# Patient Record
Sex: Female | Born: 1935 | Race: White | Hispanic: No | Marital: Single | Smoking: Current every day smoker
Health system: Southern US, Community
[De-identification: ages and names within clinical notes are randomized; demographics above are authoritative.]

## PROBLEM LIST (undated history)

## (undated) DIAGNOSIS — E785 Hyperlipidemia, unspecified: Secondary | ICD-10-CM

## (undated) DIAGNOSIS — J441 Chronic obstructive pulmonary disease with (acute) exacerbation: Secondary | ICD-10-CM

## (undated) DIAGNOSIS — I701 Atherosclerosis of renal artery: Secondary | ICD-10-CM

## (undated) DIAGNOSIS — R55 Syncope and collapse: Secondary | ICD-10-CM

## (undated) DIAGNOSIS — T148XXA Other injury of unspecified body region, initial encounter: Secondary | ICD-10-CM

## (undated) DIAGNOSIS — M1990 Unspecified osteoarthritis, unspecified site: Secondary | ICD-10-CM

## (undated) DIAGNOSIS — S0990XA Unspecified injury of head, initial encounter: Secondary | ICD-10-CM

## (undated) DIAGNOSIS — I200 Unstable angina: Secondary | ICD-10-CM

## (undated) DIAGNOSIS — D696 Thrombocytopenia, unspecified: Secondary | ICD-10-CM

## (undated) DIAGNOSIS — M19049 Primary osteoarthritis, unspecified hand: Secondary | ICD-10-CM

## (undated) DIAGNOSIS — I10 Essential (primary) hypertension: Secondary | ICD-10-CM

## (undated) DIAGNOSIS — C4490 Unspecified malignant neoplasm of skin, unspecified: Secondary | ICD-10-CM

## (undated) DIAGNOSIS — Z87442 Personal history of urinary calculi: Secondary | ICD-10-CM

## (undated) DIAGNOSIS — M4800 Spinal stenosis, site unspecified: Secondary | ICD-10-CM

## (undated) DIAGNOSIS — D485 Neoplasm of uncertain behavior of skin: Secondary | ICD-10-CM

## (undated) DIAGNOSIS — B0229 Other postherpetic nervous system involvement: Secondary | ICD-10-CM

## (undated) DIAGNOSIS — D649 Anemia, unspecified: Secondary | ICD-10-CM

## (undated) DIAGNOSIS — L259 Unspecified contact dermatitis, unspecified cause: Secondary | ICD-10-CM

## (undated) DIAGNOSIS — I6529 Occlusion and stenosis of unspecified carotid artery: Secondary | ICD-10-CM

## (undated) DIAGNOSIS — E039 Hypothyroidism, unspecified: Secondary | ICD-10-CM

## (undated) DIAGNOSIS — H43819 Vitreous degeneration, unspecified eye: Secondary | ICD-10-CM

## (undated) DIAGNOSIS — Z853 Personal history of malignant neoplasm of breast: Secondary | ICD-10-CM

## (undated) DIAGNOSIS — L089 Local infection of the skin and subcutaneous tissue, unspecified: Secondary | ICD-10-CM

## (undated) DIAGNOSIS — E559 Vitamin D deficiency, unspecified: Secondary | ICD-10-CM

## (undated) DIAGNOSIS — T887XXA Unspecified adverse effect of drug or medicament, initial encounter: Secondary | ICD-10-CM

## (undated) DIAGNOSIS — I739 Peripheral vascular disease, unspecified: Secondary | ICD-10-CM

## (undated) DIAGNOSIS — I2510 Atherosclerotic heart disease of native coronary artery without angina pectoris: Secondary | ICD-10-CM

## (undated) DIAGNOSIS — H9190 Unspecified hearing loss, unspecified ear: Secondary | ICD-10-CM

## (undated) DIAGNOSIS — B029 Zoster without complications: Secondary | ICD-10-CM

## (undated) DIAGNOSIS — R945 Abnormal results of liver function studies: Secondary | ICD-10-CM

## (undated) DIAGNOSIS — J310 Chronic rhinitis: Secondary | ICD-10-CM

## (undated) HISTORY — DX: Syncope and collapse: R55

## (undated) HISTORY — DX: Anemia, unspecified: D64.9

## (undated) HISTORY — DX: Unspecified contact dermatitis, unspecified cause: L25.9

## (undated) HISTORY — DX: Unspecified hearing loss, unspecified ear: H91.90

## (undated) HISTORY — DX: Occlusion and stenosis of unspecified carotid artery: I65.29

## (undated) HISTORY — PX: TONSILLECTOMY: SHX5217

## (undated) HISTORY — PX: CORONARY ANGIOPLASTY WITH STENT PLACEMENT: SHX49

## (undated) HISTORY — DX: Other injury of unspecified body region, initial encounter: T14.8XXA

## (undated) HISTORY — PX: LUMBAR LAMINECTOMY: SHX95

## (undated) HISTORY — DX: Unspecified injury of head, initial encounter: S09.90XA

## (undated) HISTORY — DX: Chronic rhinitis: J31.0

## (undated) HISTORY — DX: Local infection of the skin and subcutaneous tissue, unspecified: L08.9

## (undated) HISTORY — DX: Unspecified malignant neoplasm of skin, unspecified: C44.90

## (undated) HISTORY — DX: Essential (primary) hypertension: I10

## (undated) HISTORY — DX: Unspecified osteoarthritis, unspecified site: M19.90

## (undated) HISTORY — DX: Personal history of urinary calculi: Z87.442

## (undated) HISTORY — DX: Vitamin D deficiency, unspecified: E55.9

## (undated) HISTORY — PX: SKIN CANCER EXCISION: SHX779

## (undated) HISTORY — DX: Zoster without complications: B02.9

## (undated) HISTORY — PX: BREAST LUMPECTOMY: SHX2

## (undated) HISTORY — DX: Vitreous degeneration, unspecified eye: H43.819

## (undated) HISTORY — DX: Unstable angina: I20.0

## (undated) HISTORY — DX: Thrombocytopenia, unspecified: D69.6

## (undated) HISTORY — DX: Unspecified adverse effect of drug or medicament, initial encounter: T88.7XXA

## (undated) HISTORY — DX: Chronic obstructive pulmonary disease with (acute) exacerbation: J44.1

## (undated) HISTORY — DX: Peripheral vascular disease, unspecified: I73.9

## (undated) HISTORY — DX: Atherosclerotic heart disease of native coronary artery without angina pectoris: I25.10

## (undated) HISTORY — DX: Atherosclerosis of renal artery: I70.1

## (undated) HISTORY — DX: Personal history of malignant neoplasm of breast: Z85.3

## (undated) HISTORY — DX: Hypothyroidism, unspecified: E03.9

## (undated) HISTORY — DX: Neoplasm of uncertain behavior of skin: D48.5

## (undated) HISTORY — DX: Other postherpetic nervous system involvement: B02.29

## (undated) HISTORY — PX: CATARACT EXTRACTION: SUR2

## (undated) HISTORY — PX: ABDOMINAL HYSTERECTOMY: SHX81

## (undated) HISTORY — DX: Abnormal results of liver function studies: R94.5

## (undated) HISTORY — DX: Primary osteoarthritis, unspecified hand: M19.049

## (undated) HISTORY — DX: Hyperlipidemia, unspecified: E78.5

## (undated) HISTORY — DX: Spinal stenosis, site unspecified: M48.00

---

## 1996-02-02 HISTORY — PX: BREAST LUMPECTOMY: SHX2

## 1997-08-11 ENCOUNTER — Emergency Department (HOSPITAL_COMMUNITY): Admission: EM | Admit: 1997-08-11 | Discharge: 1997-08-11 | Payer: Self-pay | Admitting: Emergency Medicine

## 1998-04-08 ENCOUNTER — Encounter: Payer: Self-pay | Admitting: Emergency Medicine

## 1998-04-08 ENCOUNTER — Emergency Department (HOSPITAL_COMMUNITY): Admission: EM | Admit: 1998-04-08 | Discharge: 1998-04-08 | Payer: Self-pay | Admitting: Emergency Medicine

## 1999-06-16 ENCOUNTER — Encounter: Payer: Self-pay | Admitting: Emergency Medicine

## 1999-06-16 ENCOUNTER — Inpatient Hospital Stay (HOSPITAL_COMMUNITY): Admission: EM | Admit: 1999-06-16 | Discharge: 1999-06-18 | Payer: Self-pay | Admitting: Emergency Medicine

## 1999-08-26 ENCOUNTER — Observation Stay (HOSPITAL_COMMUNITY): Admission: RE | Admit: 1999-08-26 | Discharge: 1999-08-27 | Payer: Self-pay | Admitting: *Deleted

## 1999-09-11 ENCOUNTER — Observation Stay (HOSPITAL_COMMUNITY): Admission: RE | Admit: 1999-09-11 | Discharge: 1999-09-12 | Payer: Self-pay | Admitting: *Deleted

## 1999-11-03 ENCOUNTER — Ambulatory Visit (HOSPITAL_BASED_OUTPATIENT_CLINIC_OR_DEPARTMENT_OTHER): Admission: RE | Admit: 1999-11-03 | Discharge: 1999-11-03 | Payer: Self-pay | Admitting: Plastic Surgery

## 1999-11-03 ENCOUNTER — Encounter (INDEPENDENT_AMBULATORY_CARE_PROVIDER_SITE_OTHER): Payer: Self-pay | Admitting: *Deleted

## 2000-01-22 ENCOUNTER — Encounter: Payer: Self-pay | Admitting: *Deleted

## 2000-01-22 ENCOUNTER — Ambulatory Visit (HOSPITAL_COMMUNITY): Admission: RE | Admit: 2000-01-22 | Discharge: 2000-01-22 | Payer: Self-pay | Admitting: *Deleted

## 2000-03-11 ENCOUNTER — Encounter: Payer: Self-pay | Admitting: Emergency Medicine

## 2000-03-12 ENCOUNTER — Inpatient Hospital Stay (HOSPITAL_COMMUNITY): Admission: EM | Admit: 2000-03-12 | Discharge: 2000-03-16 | Payer: Self-pay | Admitting: Emergency Medicine

## 2000-07-28 ENCOUNTER — Ambulatory Visit (HOSPITAL_COMMUNITY): Admission: RE | Admit: 2000-07-28 | Discharge: 2000-07-29 | Payer: Self-pay | Admitting: *Deleted

## 2000-07-28 ENCOUNTER — Encounter: Payer: Self-pay | Admitting: *Deleted

## 2000-12-21 ENCOUNTER — Ambulatory Visit (HOSPITAL_COMMUNITY): Admission: RE | Admit: 2000-12-21 | Discharge: 2000-12-22 | Payer: Self-pay | Admitting: *Deleted

## 2001-04-04 ENCOUNTER — Ambulatory Visit (HOSPITAL_COMMUNITY): Admission: RE | Admit: 2001-04-04 | Discharge: 2001-04-05 | Payer: Self-pay | Admitting: *Deleted

## 2001-05-25 ENCOUNTER — Encounter: Payer: Self-pay | Admitting: *Deleted

## 2001-05-25 ENCOUNTER — Ambulatory Visit (HOSPITAL_COMMUNITY): Admission: RE | Admit: 2001-05-25 | Discharge: 2001-05-25 | Payer: Self-pay | Admitting: *Deleted

## 2001-09-07 ENCOUNTER — Encounter: Payer: Self-pay | Admitting: Orthopaedic Surgery

## 2001-09-07 ENCOUNTER — Encounter: Admission: RE | Admit: 2001-09-07 | Discharge: 2001-09-07 | Payer: Self-pay | Admitting: Orthopaedic Surgery

## 2001-09-27 ENCOUNTER — Inpatient Hospital Stay (HOSPITAL_COMMUNITY): Admission: RE | Admit: 2001-09-27 | Discharge: 2001-10-04 | Payer: Self-pay | Admitting: Orthopaedic Surgery

## 2001-09-27 ENCOUNTER — Encounter: Payer: Self-pay | Admitting: *Deleted

## 2001-09-27 ENCOUNTER — Encounter: Payer: Self-pay | Admitting: Orthopaedic Surgery

## 2001-10-01 ENCOUNTER — Encounter: Payer: Self-pay | Admitting: Orthopaedic Surgery

## 2002-09-03 ENCOUNTER — Encounter (INDEPENDENT_AMBULATORY_CARE_PROVIDER_SITE_OTHER): Payer: Self-pay | Admitting: Specialist

## 2002-09-03 ENCOUNTER — Ambulatory Visit (HOSPITAL_COMMUNITY): Admission: RE | Admit: 2002-09-03 | Discharge: 2002-09-03 | Payer: Self-pay | Admitting: Gastroenterology

## 2002-10-12 ENCOUNTER — Other Ambulatory Visit: Admission: RE | Admit: 2002-10-12 | Discharge: 2002-10-12 | Payer: Self-pay | Admitting: *Deleted

## 2003-04-08 ENCOUNTER — Observation Stay (HOSPITAL_COMMUNITY): Admission: AD | Admit: 2003-04-08 | Discharge: 2003-04-09 | Payer: Self-pay | Admitting: Cardiology

## 2003-09-06 ENCOUNTER — Encounter: Admission: RE | Admit: 2003-09-06 | Discharge: 2003-09-06 | Payer: Self-pay | Admitting: Internal Medicine

## 2003-09-18 ENCOUNTER — Encounter: Admission: RE | Admit: 2003-09-18 | Discharge: 2003-09-18 | Payer: Self-pay | Admitting: Internal Medicine

## 2003-12-09 ENCOUNTER — Ambulatory Visit: Payer: Self-pay | Admitting: Internal Medicine

## 2003-12-16 ENCOUNTER — Ambulatory Visit: Payer: Self-pay | Admitting: Internal Medicine

## 2003-12-23 ENCOUNTER — Ambulatory Visit: Payer: Self-pay | Admitting: Internal Medicine

## 2003-12-30 ENCOUNTER — Ambulatory Visit: Payer: Self-pay | Admitting: Cardiology

## 2003-12-30 ENCOUNTER — Observation Stay (HOSPITAL_COMMUNITY): Admission: EM | Admit: 2003-12-30 | Discharge: 2004-01-01 | Payer: Self-pay | Admitting: Emergency Medicine

## 2003-12-31 ENCOUNTER — Ambulatory Visit: Payer: Self-pay | Admitting: Cardiology

## 2004-01-07 ENCOUNTER — Ambulatory Visit: Payer: Self-pay | Admitting: Internal Medicine

## 2004-01-13 ENCOUNTER — Ambulatory Visit: Payer: Self-pay | Admitting: Cardiology

## 2004-01-22 ENCOUNTER — Ambulatory Visit: Payer: Self-pay | Admitting: Internal Medicine

## 2004-02-10 ENCOUNTER — Ambulatory Visit: Payer: Self-pay | Admitting: Cardiology

## 2004-03-10 ENCOUNTER — Ambulatory Visit: Payer: Self-pay

## 2004-04-14 ENCOUNTER — Ambulatory Visit: Payer: Self-pay | Admitting: Internal Medicine

## 2004-06-05 ENCOUNTER — Ambulatory Visit: Payer: Self-pay | Admitting: Internal Medicine

## 2004-07-15 ENCOUNTER — Ambulatory Visit: Payer: Self-pay | Admitting: Internal Medicine

## 2004-07-24 ENCOUNTER — Ambulatory Visit: Payer: Self-pay | Admitting: Cardiology

## 2004-07-31 ENCOUNTER — Ambulatory Visit: Payer: Self-pay | Admitting: Cardiology

## 2004-09-07 ENCOUNTER — Ambulatory Visit: Payer: Self-pay | Admitting: Internal Medicine

## 2004-09-15 ENCOUNTER — Ambulatory Visit: Payer: Self-pay | Admitting: Cardiology

## 2004-09-18 ENCOUNTER — Ambulatory Visit: Payer: Self-pay | Admitting: Internal Medicine

## 2004-11-09 ENCOUNTER — Ambulatory Visit: Payer: Self-pay | Admitting: Internal Medicine

## 2004-11-20 ENCOUNTER — Ambulatory Visit: Payer: Self-pay | Admitting: Internal Medicine

## 2004-12-08 ENCOUNTER — Ambulatory Visit: Payer: Self-pay | Admitting: Internal Medicine

## 2004-12-14 ENCOUNTER — Ambulatory Visit: Payer: Self-pay | Admitting: Internal Medicine

## 2004-12-26 ENCOUNTER — Ambulatory Visit: Payer: Self-pay | Admitting: Family Medicine

## 2005-01-13 ENCOUNTER — Ambulatory Visit: Payer: Self-pay | Admitting: Internal Medicine

## 2005-01-29 ENCOUNTER — Ambulatory Visit: Payer: Self-pay | Admitting: Internal Medicine

## 2005-02-04 ENCOUNTER — Ambulatory Visit: Payer: Self-pay | Admitting: Internal Medicine

## 2005-03-17 ENCOUNTER — Ambulatory Visit: Payer: Self-pay

## 2005-04-13 ENCOUNTER — Ambulatory Visit: Payer: Self-pay | Admitting: Internal Medicine

## 2005-04-21 ENCOUNTER — Ambulatory Visit: Payer: Self-pay | Admitting: Internal Medicine

## 2005-04-30 ENCOUNTER — Ambulatory Visit: Payer: Self-pay | Admitting: Internal Medicine

## 2005-07-14 ENCOUNTER — Ambulatory Visit: Payer: Self-pay | Admitting: Internal Medicine

## 2005-08-12 ENCOUNTER — Ambulatory Visit: Payer: Self-pay | Admitting: Cardiology

## 2005-08-13 ENCOUNTER — Ambulatory Visit: Payer: Self-pay | Admitting: Internal Medicine

## 2005-08-18 ENCOUNTER — Ambulatory Visit: Payer: Self-pay | Admitting: Oncology

## 2005-08-19 ENCOUNTER — Encounter: Payer: Self-pay | Admitting: Internal Medicine

## 2005-08-19 ENCOUNTER — Ambulatory Visit: Payer: Self-pay

## 2005-08-30 LAB — CBC WITH DIFFERENTIAL/PLATELET
BASO%: 0.5 % (ref 0.0–2.0)
Basophils Absolute: 0 10e3/uL (ref 0.0–0.1)
EOS%: 0.7 % (ref 0.0–7.0)
Eosinophils Absolute: 0.1 10e3/uL (ref 0.0–0.5)
HCT: 38.5 % (ref 34.8–46.6)
HGB: 13.1 g/dL (ref 11.6–15.9)
LYMPH%: 35.1 % (ref 14.0–48.0)
MCH: 32.3 pg (ref 26.0–34.0)
MCHC: 34.1 g/dL (ref 32.0–36.0)
MCV: 94.8 fL (ref 81.0–101.0)
MONO#: 0.6 10e3/uL (ref 0.1–0.9)
MONO%: 7.2 % (ref 0.0–13.0)
NEUT#: 4.9 10e3/uL (ref 1.5–6.5)
NEUT%: 56.5 % (ref 39.6–76.8)
Platelets: 203 10e3/uL (ref 145–400)
RBC: 4.06 10e6/uL (ref 3.70–5.32)
RDW: 14.4 % (ref 11.3–14.5)
WBC: 8.6 10e3/uL (ref 3.9–10.0)
lymph#: 3 10e3/uL (ref 0.9–3.3)

## 2005-08-30 LAB — APTT: aPTT: 29 seconds (ref 24–37)

## 2005-08-30 LAB — PROTHROMBIN TIME: INR: 0.9 (ref 0.0–1.5)

## 2005-08-31 LAB — COMPREHENSIVE METABOLIC PANEL
AST: 22 U/L (ref 0–37)
BUN: 24 mg/dL — ABNORMAL HIGH (ref 6–23)
Calcium: 9.5 mg/dL (ref 8.4–10.5)
Chloride: 108 mEq/L (ref 96–112)
Creatinine, Ser: 1.02 mg/dL (ref 0.40–1.20)
Glucose, Bld: 91 mg/dL (ref 70–99)

## 2005-09-01 ENCOUNTER — Ambulatory Visit (HOSPITAL_COMMUNITY): Admission: RE | Admit: 2005-09-01 | Discharge: 2005-09-01 | Payer: Self-pay | Admitting: Oncology

## 2005-09-20 ENCOUNTER — Ambulatory Visit: Payer: Self-pay

## 2005-10-21 ENCOUNTER — Ambulatory Visit: Payer: Self-pay | Admitting: Internal Medicine

## 2005-10-29 ENCOUNTER — Ambulatory Visit: Payer: Self-pay | Admitting: Internal Medicine

## 2005-11-09 ENCOUNTER — Emergency Department (HOSPITAL_COMMUNITY): Admission: EM | Admit: 2005-11-09 | Discharge: 2005-11-09 | Payer: Self-pay | Admitting: Family Medicine

## 2005-12-01 ENCOUNTER — Ambulatory Visit: Payer: Self-pay | Admitting: Internal Medicine

## 2005-12-14 ENCOUNTER — Ambulatory Visit: Payer: Self-pay | Admitting: Internal Medicine

## 2005-12-14 LAB — CONVERTED CEMR LAB
CO2: 27 meq/L (ref 19–32)
Calcium: 9.3 mg/dL (ref 8.4–10.5)
Creatinine, Ser: 1 mg/dL (ref 0.4–1.2)
Glomerular Filtration Rate, Af Am: 69 mL/min/{1.73_m2}
Glucose, Bld: 96 mg/dL (ref 70–99)
LDL Cholesterol: 79 mg/dL (ref 0–99)
Potassium: 4.3 meq/L (ref 3.5–5.1)
Sodium: 142 meq/L (ref 135–145)
Triglyceride fasting, serum: 83 mg/dL (ref 0–149)
VLDL: 17 mg/dL (ref 0–40)

## 2005-12-26 ENCOUNTER — Emergency Department (HOSPITAL_COMMUNITY): Admission: EM | Admit: 2005-12-26 | Discharge: 2005-12-26 | Payer: Self-pay | Admitting: Family Medicine

## 2006-01-03 ENCOUNTER — Ambulatory Visit: Payer: Self-pay | Admitting: Internal Medicine

## 2006-02-13 DIAGNOSIS — I739 Peripheral vascular disease, unspecified: Secondary | ICD-10-CM | POA: Insufficient documentation

## 2006-02-13 DIAGNOSIS — I2510 Atherosclerotic heart disease of native coronary artery without angina pectoris: Secondary | ICD-10-CM | POA: Insufficient documentation

## 2006-02-13 DIAGNOSIS — I10 Essential (primary) hypertension: Secondary | ICD-10-CM | POA: Insufficient documentation

## 2006-02-13 DIAGNOSIS — C4490 Unspecified malignant neoplasm of skin, unspecified: Secondary | ICD-10-CM | POA: Insufficient documentation

## 2006-02-13 DIAGNOSIS — Z87448 Personal history of other diseases of urinary system: Secondary | ICD-10-CM | POA: Insufficient documentation

## 2006-02-13 HISTORY — DX: Atherosclerotic heart disease of native coronary artery without angina pectoris: I25.10

## 2006-02-13 HISTORY — DX: Essential (primary) hypertension: I10

## 2006-02-13 HISTORY — DX: Unspecified malignant neoplasm of skin, unspecified: C44.90

## 2006-02-13 HISTORY — DX: Peripheral vascular disease, unspecified: I73.9

## 2006-04-05 ENCOUNTER — Ambulatory Visit: Payer: Self-pay | Admitting: Internal Medicine

## 2006-07-29 ENCOUNTER — Ambulatory Visit: Payer: Self-pay | Admitting: Cardiology

## 2006-07-29 LAB — CONVERTED CEMR LAB
ALT: 21 units/L (ref 0–35)
AST: 23 units/L (ref 0–37)
Albumin: 3.6 g/dL (ref 3.5–5.2)
Alkaline Phosphatase: 76 units/L (ref 39–117)
BUN: 16 mg/dL (ref 6–23)
GFR calc non Af Amer: 51 mL/min
Total Protein: 6.2 g/dL (ref 6.0–8.3)
VLDL: 21 mg/dL (ref 0–40)

## 2006-08-25 ENCOUNTER — Ambulatory Visit: Payer: Self-pay | Admitting: Family Medicine

## 2006-08-25 DIAGNOSIS — H9190 Unspecified hearing loss, unspecified ear: Secondary | ICD-10-CM | POA: Insufficient documentation

## 2006-08-25 DIAGNOSIS — B0229 Other postherpetic nervous system involvement: Secondary | ICD-10-CM | POA: Insufficient documentation

## 2006-08-25 DIAGNOSIS — M26629 Arthralgia of temporomandibular joint, unspecified side: Secondary | ICD-10-CM | POA: Insufficient documentation

## 2006-08-25 HISTORY — DX: Other postherpetic nervous system involvement: B02.29

## 2006-08-25 HISTORY — DX: Unspecified hearing loss, unspecified ear: H91.90

## 2006-09-20 ENCOUNTER — Ambulatory Visit: Payer: Self-pay | Admitting: Cardiology

## 2006-09-20 ENCOUNTER — Encounter: Payer: Self-pay | Admitting: Internal Medicine

## 2006-09-20 ENCOUNTER — Ambulatory Visit: Payer: Self-pay

## 2006-09-20 LAB — CONVERTED CEMR LAB
Bilirubin, Direct: 0.1 mg/dL (ref 0.0–0.3)
HDL: 44.9 mg/dL (ref >39.0)
LDL Cholesterol: 59 mg/dL (ref 0–99)
Total Bilirubin: 1 mg/dL (ref 0.3–1.2)
Total Protein: 5.6 g/dL — ABNORMAL LOW (ref 6.0–8.3)
Triglycerides: 64 mg/dL (ref 0–149)

## 2006-09-27 ENCOUNTER — Ambulatory Visit: Payer: Self-pay | Admitting: Internal Medicine

## 2006-09-27 DIAGNOSIS — M1990 Unspecified osteoarthritis, unspecified site: Secondary | ICD-10-CM | POA: Insufficient documentation

## 2006-09-27 DIAGNOSIS — M79609 Pain in unspecified limb: Secondary | ICD-10-CM | POA: Insufficient documentation

## 2006-09-27 DIAGNOSIS — F17200 Nicotine dependence, unspecified, uncomplicated: Secondary | ICD-10-CM | POA: Insufficient documentation

## 2006-09-27 HISTORY — DX: Unspecified osteoarthritis, unspecified site: M19.90

## 2006-09-28 ENCOUNTER — Encounter: Payer: Self-pay | Admitting: Internal Medicine

## 2006-10-03 ENCOUNTER — Encounter: Payer: Self-pay | Admitting: Internal Medicine

## 2006-10-03 DIAGNOSIS — I779 Disorder of arteries and arterioles, unspecified: Secondary | ICD-10-CM | POA: Insufficient documentation

## 2006-10-03 DIAGNOSIS — I6529 Occlusion and stenosis of unspecified carotid artery: Secondary | ICD-10-CM

## 2006-10-03 DIAGNOSIS — I739 Peripheral vascular disease, unspecified: Secondary | ICD-10-CM

## 2006-10-03 HISTORY — DX: Occlusion and stenosis of unspecified carotid artery: I65.29

## 2006-11-01 ENCOUNTER — Ambulatory Visit: Payer: Self-pay | Admitting: Internal Medicine

## 2006-11-01 DIAGNOSIS — M4800 Spinal stenosis, site unspecified: Secondary | ICD-10-CM

## 2006-11-01 HISTORY — DX: Spinal stenosis, site unspecified: M48.00

## 2006-11-05 ENCOUNTER — Encounter: Payer: Self-pay | Admitting: Internal Medicine

## 2006-11-05 ENCOUNTER — Ambulatory Visit: Payer: Self-pay | Admitting: Family Medicine

## 2006-11-08 ENCOUNTER — Ambulatory Visit: Payer: Self-pay | Admitting: Family Medicine

## 2006-11-18 ENCOUNTER — Telehealth: Payer: Self-pay | Admitting: Family Medicine

## 2006-11-21 ENCOUNTER — Ambulatory Visit: Payer: Self-pay | Admitting: Internal Medicine

## 2006-11-21 DIAGNOSIS — J449 Chronic obstructive pulmonary disease, unspecified: Secondary | ICD-10-CM | POA: Insufficient documentation

## 2006-12-13 ENCOUNTER — Ambulatory Visit: Payer: Self-pay | Admitting: Internal Medicine

## 2006-12-13 DIAGNOSIS — J310 Chronic rhinitis: Secondary | ICD-10-CM | POA: Insufficient documentation

## 2006-12-13 HISTORY — DX: Chronic rhinitis: J31.0

## 2006-12-13 LAB — CONVERTED CEMR LAB
Basophils Absolute: 0 10*3/uL (ref 0.0–0.1)
Eosinophils Absolute: 0 10*3/uL (ref 0.0–0.6)
HCT: 38.6 % (ref 36.0–46.0)
Lymphocytes Relative: 13 % (ref 12.0–46.0)
MCV: 93.9 fL (ref 78.0–100.0)
Neutro Abs: 10.8 10*3/uL — ABNORMAL HIGH (ref 1.4–7.7)
TSH: 0.58 microintl units/mL (ref 0.35–5.50)
WBC: 13.1 10*3/uL — ABNORMAL HIGH (ref 4.5–10.5)

## 2006-12-27 LAB — CONVERTED CEMR LAB: Vit D, 1,25-Dihydroxy: 18 — ABNORMAL LOW (ref 30–89)

## 2007-01-30 ENCOUNTER — Ambulatory Visit: Payer: Self-pay | Admitting: Internal Medicine

## 2007-01-30 DIAGNOSIS — T887XXA Unspecified adverse effect of drug or medicament, initial encounter: Secondary | ICD-10-CM

## 2007-01-30 DIAGNOSIS — E039 Hypothyroidism, unspecified: Secondary | ICD-10-CM

## 2007-01-30 HISTORY — DX: Hypothyroidism, unspecified: E03.9

## 2007-01-30 HISTORY — DX: Unspecified adverse effect of drug or medicament, initial encounter: T88.7XXA

## 2007-01-30 LAB — CONVERTED CEMR LAB
Glucose, Urine, Semiquant: NEGATIVE
Nitrite: NEGATIVE
Specific Gravity, Urine: 1.025
Urobilinogen, UA: 0.2
Vit D, 1,25-Dihydroxy: 53 (ref 30–89)

## 2007-02-03 LAB — CONVERTED CEMR LAB
Alkaline Phosphatase: 77 units/L (ref 39–117)
Bilirubin, Direct: 0.2 mg/dL (ref 0.0–0.3)
CO2: 30 meq/L (ref 19–32)
Eosinophils Absolute: 0.2 10*3/uL (ref 0.0–0.6)
GFR calc Af Amer: 69 mL/min
GFR calc non Af Amer: 57 mL/min
Glucose, Bld: 94 mg/dL (ref 70–99)
Lymphocytes Relative: 30.9 % (ref 12.0–46.0)
MCHC: 35.1 g/dL (ref 30.0–36.0)
Monocytes Relative: 7.1 % (ref 3.0–11.0)
Neutro Abs: 3.7 10*3/uL (ref 1.4–7.7)
Potassium: 4.1 meq/L (ref 3.5–5.1)
RBC: 4.24 M/uL (ref 3.87–5.11)
TSH: 1.76 microintl units/mL (ref 0.35–5.50)
Total Protein: 5.8 g/dL — ABNORMAL LOW (ref 6.0–8.3)
WBC: 6.4 10*3/uL (ref 4.5–10.5)

## 2007-02-07 ENCOUNTER — Ambulatory Visit: Payer: Self-pay | Admitting: Internal Medicine

## 2007-02-07 DIAGNOSIS — E559 Vitamin D deficiency, unspecified: Secondary | ICD-10-CM | POA: Insufficient documentation

## 2007-02-07 HISTORY — DX: Vitamin D deficiency, unspecified: E55.9

## 2007-02-07 LAB — CONVERTED CEMR LAB
Bilirubin Urine: NEGATIVE
Glucose, Urine, Semiquant: 100
Protein, U semiquant: NEGATIVE
Specific Gravity, Urine: 1.015
Urobilinogen, UA: 0.2

## 2007-03-13 ENCOUNTER — Encounter: Payer: Self-pay | Admitting: Internal Medicine

## 2007-03-15 ENCOUNTER — Encounter: Payer: Self-pay | Admitting: Internal Medicine

## 2007-04-11 ENCOUNTER — Encounter: Payer: Self-pay | Admitting: Internal Medicine

## 2007-05-01 ENCOUNTER — Encounter: Payer: Self-pay | Admitting: Internal Medicine

## 2007-06-01 ENCOUNTER — Ambulatory Visit: Payer: Self-pay | Admitting: Internal Medicine

## 2007-07-13 ENCOUNTER — Ambulatory Visit: Payer: Self-pay | Admitting: Cardiology

## 2007-07-13 LAB — CONVERTED CEMR LAB
ALT: 24 units/L (ref 0–35)
AST: 30 units/L (ref 0–37)
Alkaline Phosphatase: 79 units/L (ref 39–117)
BUN: 36 mg/dL — ABNORMAL HIGH (ref 6–23)
Chloride: 107 meq/L (ref 96–112)
Creatinine, Ser: 1.2 mg/dL (ref 0.4–1.2)
GFR calc Af Amer: 56 mL/min
Glucose, Bld: 102 mg/dL — ABNORMAL HIGH (ref 70–99)
Sodium: 141 meq/L (ref 135–145)
Total CHOL/HDL Ratio: 2.5

## 2007-07-26 ENCOUNTER — Ambulatory Visit: Payer: Self-pay | Admitting: Cardiology

## 2007-07-26 LAB — CONVERTED CEMR LAB
BUN: 28 mg/dL — ABNORMAL HIGH (ref 6–23)
Calcium: 9.1 mg/dL (ref 8.4–10.5)
Creatinine, Ser: 1.2 mg/dL (ref 0.4–1.2)
Total CK: 58 units/L (ref 7–177)

## 2007-08-22 ENCOUNTER — Ambulatory Visit: Payer: Self-pay

## 2007-09-08 ENCOUNTER — Ambulatory Visit: Payer: Self-pay | Admitting: Internal Medicine

## 2007-09-08 DIAGNOSIS — R5383 Other fatigue: Secondary | ICD-10-CM

## 2007-09-08 DIAGNOSIS — R5381 Other malaise: Secondary | ICD-10-CM | POA: Insufficient documentation

## 2007-09-08 LAB — CONVERTED CEMR LAB
Anti Nuclear Antibody(ANA): NEGATIVE
Vit D, 1,25-Dihydroxy: 42 (ref 30–89)

## 2007-09-11 LAB — CONVERTED CEMR LAB
BUN: 27 mg/dL — ABNORMAL HIGH (ref 6–23)
Chloride: 113 meq/L — ABNORMAL HIGH (ref 96–112)
Creatinine, Ser: 1 mg/dL (ref 0.4–1.2)
Eosinophils Relative: 1.4 % (ref 0.0–5.0)
HCT: 39.8 % (ref 36.0–46.0)
Hemoglobin: 13.5 g/dL (ref 12.0–15.0)
Lymphocytes Relative: 28.6 % (ref 12.0–46.0)
MCHC: 33.9 g/dL (ref 30.0–36.0)
Platelets: 170 10*3/uL (ref 150–400)
RDW: 14.4 % (ref 11.5–14.6)
Sodium: 144 meq/L (ref 135–145)

## 2007-09-15 ENCOUNTER — Ambulatory Visit: Payer: Self-pay

## 2007-09-25 ENCOUNTER — Ambulatory Visit: Payer: Self-pay | Admitting: Internal Medicine

## 2007-09-25 DIAGNOSIS — L259 Unspecified contact dermatitis, unspecified cause: Secondary | ICD-10-CM | POA: Insufficient documentation

## 2007-09-25 HISTORY — DX: Unspecified contact dermatitis, unspecified cause: L25.9

## 2007-09-26 ENCOUNTER — Encounter: Payer: Self-pay | Admitting: Internal Medicine

## 2007-09-28 ENCOUNTER — Telehealth: Payer: Self-pay | Admitting: *Deleted

## 2007-11-07 ENCOUNTER — Ambulatory Visit: Payer: Self-pay | Admitting: Internal Medicine

## 2007-11-15 ENCOUNTER — Encounter: Payer: Self-pay | Admitting: Internal Medicine

## 2007-12-16 ENCOUNTER — Emergency Department (HOSPITAL_COMMUNITY): Admission: EM | Admit: 2007-12-16 | Discharge: 2007-12-16 | Payer: Self-pay | Admitting: Family Medicine

## 2008-01-12 ENCOUNTER — Ambulatory Visit: Payer: Self-pay

## 2008-01-17 ENCOUNTER — Ambulatory Visit: Payer: Self-pay | Admitting: Internal Medicine

## 2008-01-17 DIAGNOSIS — G44309 Post-traumatic headache, unspecified, not intractable: Secondary | ICD-10-CM | POA: Insufficient documentation

## 2008-01-17 DIAGNOSIS — R636 Underweight: Secondary | ICD-10-CM | POA: Insufficient documentation

## 2008-01-19 ENCOUNTER — Ambulatory Visit: Payer: Self-pay

## 2008-01-31 ENCOUNTER — Ambulatory Visit: Payer: Self-pay | Admitting: Internal Medicine

## 2008-02-01 ENCOUNTER — Encounter: Payer: Self-pay | Admitting: Internal Medicine

## 2008-02-06 ENCOUNTER — Ambulatory Visit: Payer: Self-pay | Admitting: Cardiology

## 2008-02-21 ENCOUNTER — Ambulatory Visit: Payer: Self-pay | Admitting: Cardiology

## 2008-02-21 LAB — CONVERTED CEMR LAB
ALT: 19 units/L (ref 0–35)
Albumin: 3.5 g/dL (ref 3.5–5.2)
Alkaline Phosphatase: 70 units/L (ref 39–117)
Bilirubin, Direct: 0.1 mg/dL (ref 0.0–0.3)
Calcium: 9.3 mg/dL (ref 8.4–10.5)
GFR calc Af Amer: 78 mL/min
Glucose, Bld: 106 mg/dL — ABNORMAL HIGH (ref 70–99)
HDL: 38.9 mg/dL — ABNORMAL LOW (ref >39.0)
LDL Cholesterol: 62 mg/dL (ref 0–99)
Sodium: 144 meq/L (ref 135–145)
Total Bilirubin: 0.7 mg/dL (ref 0.3–1.2)
Total Protein: 6.4 g/dL (ref 6.0–8.3)
VLDL: 11 mg/dL (ref 0–40)

## 2008-02-23 ENCOUNTER — Ambulatory Visit: Payer: Self-pay | Admitting: Cardiovascular Disease

## 2008-02-26 ENCOUNTER — Ambulatory Visit: Payer: Self-pay | Admitting: Internal Medicine

## 2008-02-29 ENCOUNTER — Ambulatory Visit: Payer: Self-pay | Admitting: Cardiovascular Disease

## 2008-02-29 LAB — CONVERTED CEMR LAB
BUN: 19 mg/dL (ref 6–23)
Basophils Absolute: 0 10*3/uL (ref 0.0–0.1)
Basophils Relative: 0.3 % (ref 0.0–3.0)
Calcium: 9.1 mg/dL (ref 8.4–10.5)
Creatinine, Ser: 0.8 mg/dL (ref 0.4–1.2)
Eosinophils Absolute: 0.1 10*3/uL (ref 0.0–0.7)
Eosinophils Relative: 1.8 % (ref 0.0–5.0)
GFR calc Af Amer: 89 mL/min
Glucose, Bld: 104 mg/dL — ABNORMAL HIGH (ref 70–99)
INR: 0.9 (ref 0.8–1.0)
Monocytes Absolute: 0.5 10*3/uL (ref 0.1–1.0)
Platelets: 161 10*3/uL (ref 150–400)
RBC: 4.28 M/uL (ref 3.87–5.11)

## 2008-03-06 ENCOUNTER — Ambulatory Visit (HOSPITAL_COMMUNITY): Admission: RE | Admit: 2008-03-06 | Discharge: 2008-03-06 | Payer: Self-pay | Admitting: Cardiovascular Disease

## 2008-03-06 ENCOUNTER — Ambulatory Visit: Payer: Self-pay | Admitting: Cardiovascular Disease

## 2008-03-10 ENCOUNTER — Encounter: Payer: Self-pay | Admitting: Internal Medicine

## 2008-03-10 ENCOUNTER — Inpatient Hospital Stay (HOSPITAL_COMMUNITY): Admission: EM | Admit: 2008-03-10 | Discharge: 2008-03-13 | Payer: Self-pay | Admitting: Emergency Medicine

## 2008-03-10 ENCOUNTER — Ambulatory Visit: Payer: Self-pay | Admitting: Internal Medicine

## 2008-03-10 DIAGNOSIS — E785 Hyperlipidemia, unspecified: Secondary | ICD-10-CM | POA: Insufficient documentation

## 2008-03-10 HISTORY — DX: Hyperlipidemia, unspecified: E78.5

## 2008-03-11 ENCOUNTER — Encounter: Payer: Self-pay | Admitting: Internal Medicine

## 2008-03-18 ENCOUNTER — Ambulatory Visit: Payer: Self-pay | Admitting: Internal Medicine

## 2008-03-18 DIAGNOSIS — D649 Anemia, unspecified: Secondary | ICD-10-CM | POA: Insufficient documentation

## 2008-03-18 HISTORY — DX: Anemia, unspecified: D64.9

## 2008-03-21 ENCOUNTER — Ambulatory Visit: Payer: Self-pay | Admitting: Internal Medicine

## 2008-03-26 ENCOUNTER — Ambulatory Visit: Payer: Self-pay | Admitting: Cardiovascular Disease

## 2008-04-05 ENCOUNTER — Ambulatory Visit: Payer: Self-pay | Admitting: Internal Medicine

## 2008-04-05 DIAGNOSIS — G4700 Insomnia, unspecified: Secondary | ICD-10-CM | POA: Insufficient documentation

## 2008-04-30 ENCOUNTER — Encounter: Payer: Self-pay | Admitting: Internal Medicine

## 2008-05-02 ENCOUNTER — Encounter: Payer: Self-pay | Admitting: Internal Medicine

## 2008-05-06 ENCOUNTER — Encounter: Payer: Self-pay | Admitting: Internal Medicine

## 2008-05-10 ENCOUNTER — Encounter: Payer: Self-pay | Admitting: Internal Medicine

## 2008-05-31 ENCOUNTER — Emergency Department (HOSPITAL_COMMUNITY): Admission: EM | Admit: 2008-05-31 | Discharge: 2008-05-31 | Payer: Self-pay | Admitting: Emergency Medicine

## 2008-06-11 ENCOUNTER — Ambulatory Visit: Payer: Self-pay | Admitting: Internal Medicine

## 2008-06-11 DIAGNOSIS — R109 Unspecified abdominal pain: Secondary | ICD-10-CM | POA: Insufficient documentation

## 2008-06-11 DIAGNOSIS — R634 Abnormal weight loss: Secondary | ICD-10-CM | POA: Insufficient documentation

## 2008-06-11 DIAGNOSIS — Z9189 Other specified personal risk factors, not elsewhere classified: Secondary | ICD-10-CM | POA: Insufficient documentation

## 2008-06-12 ENCOUNTER — Ambulatory Visit: Payer: Self-pay | Admitting: Internal Medicine

## 2008-06-17 ENCOUNTER — Encounter (INDEPENDENT_AMBULATORY_CARE_PROVIDER_SITE_OTHER): Payer: Self-pay | Admitting: *Deleted

## 2008-07-24 ENCOUNTER — Ambulatory Visit: Payer: Self-pay | Admitting: Internal Medicine

## 2008-07-24 DIAGNOSIS — Z853 Personal history of malignant neoplasm of breast: Secondary | ICD-10-CM

## 2008-07-24 HISTORY — DX: Personal history of malignant neoplasm of breast: Z85.3

## 2008-07-24 LAB — CONVERTED CEMR LAB
ALT: 19 units/L (ref 0–35)
Alkaline Phosphatase: 58 units/L (ref 39–117)
Basophils Relative: 0.7 % (ref 0.0–3.0)
CO2: 29 meq/L (ref 19–32)
Calcium: 9.3 mg/dL (ref 8.4–10.5)
Chloride: 106 meq/L (ref 96–112)
Creatinine, Ser: 1.1 mg/dL (ref 0.4–1.2)
Eosinophils Absolute: 0.2 10*3/uL (ref 0.0–0.7)
Eosinophils Relative: 2.6 % (ref 0.0–5.0)
Lymphocytes Relative: 22 % (ref 12.0–46.0)
Lymphs Abs: 1.4 10*3/uL (ref 0.7–4.0)
MCHC: 34.8 g/dL (ref 30.0–36.0)
MCV: 90.6 fL (ref 78.0–100.0)
Monocytes Relative: 8.9 % (ref 3.0–12.0)
Neutro Abs: 4.3 10*3/uL (ref 1.4–7.7)
Neutrophils Relative %: 65.8 % (ref 43.0–77.0)
Platelets: 129 10*3/uL — ABNORMAL LOW (ref 150.0–400.0)
Potassium: 4.7 meq/L (ref 3.5–5.1)
RBC: 4.48 M/uL (ref 3.87–5.11)
Total Protein: 6.5 g/dL (ref 6.0–8.3)

## 2008-07-26 ENCOUNTER — Ambulatory Visit: Payer: Self-pay | Admitting: Internal Medicine

## 2008-07-29 ENCOUNTER — Encounter: Admission: RE | Admit: 2008-07-29 | Discharge: 2008-07-29 | Payer: Self-pay | Admitting: Internal Medicine

## 2008-07-30 DIAGNOSIS — D696 Thrombocytopenia, unspecified: Secondary | ICD-10-CM

## 2008-07-30 HISTORY — DX: Thrombocytopenia, unspecified: D69.6

## 2008-07-31 DIAGNOSIS — R0602 Shortness of breath: Secondary | ICD-10-CM | POA: Insufficient documentation

## 2008-08-01 ENCOUNTER — Ambulatory Visit: Payer: Self-pay | Admitting: Cardiology

## 2008-08-01 DIAGNOSIS — I701 Atherosclerosis of renal artery: Secondary | ICD-10-CM

## 2008-08-01 HISTORY — DX: Atherosclerosis of renal artery: I70.1

## 2008-08-23 ENCOUNTER — Telehealth: Payer: Self-pay | Admitting: Cardiology

## 2008-08-26 ENCOUNTER — Encounter: Payer: Self-pay | Admitting: Internal Medicine

## 2008-09-02 ENCOUNTER — Ambulatory Visit: Payer: Self-pay

## 2008-09-02 ENCOUNTER — Ambulatory Visit: Payer: Self-pay | Admitting: Cardiology

## 2008-09-03 ENCOUNTER — Encounter (INDEPENDENT_AMBULATORY_CARE_PROVIDER_SITE_OTHER): Payer: Self-pay | Admitting: *Deleted

## 2008-09-10 ENCOUNTER — Telehealth: Payer: Self-pay | Admitting: Cardiology

## 2008-09-17 ENCOUNTER — Ambulatory Visit: Payer: Self-pay | Admitting: Internal Medicine

## 2008-09-17 ENCOUNTER — Telehealth: Payer: Self-pay | Admitting: Cardiology

## 2008-09-17 ENCOUNTER — Encounter (INDEPENDENT_AMBULATORY_CARE_PROVIDER_SITE_OTHER): Payer: Self-pay | Admitting: *Deleted

## 2008-09-17 DIAGNOSIS — N289 Disorder of kidney and ureter, unspecified: Secondary | ICD-10-CM | POA: Insufficient documentation

## 2008-09-20 ENCOUNTER — Encounter: Admission: RE | Admit: 2008-09-20 | Discharge: 2008-09-20 | Payer: Self-pay | Admitting: Cardiology

## 2008-09-23 ENCOUNTER — Ambulatory Visit: Payer: Self-pay | Admitting: Cardiovascular Disease

## 2008-10-17 ENCOUNTER — Encounter: Payer: Self-pay | Admitting: Internal Medicine

## 2008-10-23 ENCOUNTER — Telehealth (INDEPENDENT_AMBULATORY_CARE_PROVIDER_SITE_OTHER): Payer: Self-pay | Admitting: *Deleted

## 2008-12-16 ENCOUNTER — Ambulatory Visit: Payer: Self-pay | Admitting: Internal Medicine

## 2008-12-16 DIAGNOSIS — M19049 Primary osteoarthritis, unspecified hand: Secondary | ICD-10-CM

## 2008-12-16 DIAGNOSIS — D485 Neoplasm of uncertain behavior of skin: Secondary | ICD-10-CM

## 2008-12-16 HISTORY — DX: Primary osteoarthritis, unspecified hand: M19.049

## 2008-12-16 HISTORY — DX: Neoplasm of uncertain behavior of skin: D48.5

## 2008-12-17 LAB — CONVERTED CEMR LAB
ALT: 128 units/L — ABNORMAL HIGH (ref 0–35)
BUN: 24 mg/dL — ABNORMAL HIGH (ref 6–23)
Basophils Absolute: 0 10*3/uL (ref 0.0–0.1)
Bilirubin, Direct: 0.1 mg/dL (ref 0.0–0.3)
CO2: 28 meq/L (ref 19–32)
Calcium: 9.2 mg/dL (ref 8.4–10.5)
Chloride: 105 meq/L (ref 96–112)
Creatinine, Ser: 0.8 mg/dL (ref 0.4–1.2)
Eosinophils Absolute: 0.1 10*3/uL (ref 0.0–0.7)
Eosinophils Relative: 2.3 % (ref 0.0–5.0)
GFR calc non Af Amer: 73.53 mL/min (ref >60)
Glucose, Bld: 101 mg/dL — ABNORMAL HIGH (ref 70–99)
Lymphocytes Relative: 20.9 % (ref 12.0–46.0)
MCHC: 34.6 g/dL (ref 30.0–36.0)
Monocytes Absolute: 0.5 10*3/uL (ref 0.1–1.0)
Monocytes Relative: 9.6 % (ref 3.0–12.0)
Potassium: 3.9 meq/L (ref 3.5–5.1)
Sodium: 143 meq/L (ref 135–145)
TSH: 1.34 microintl units/mL (ref 0.35–5.50)
Total Bilirubin: 1 mg/dL (ref 0.3–1.2)
Total Protein: 6.3 g/dL (ref 6.0–8.3)
WBC: 5.3 10*3/uL (ref 4.5–10.5)

## 2008-12-18 ENCOUNTER — Encounter: Payer: Self-pay | Admitting: Internal Medicine

## 2008-12-18 ENCOUNTER — Telehealth: Payer: Self-pay | Admitting: Internal Medicine

## 2008-12-20 ENCOUNTER — Telehealth: Payer: Self-pay | Admitting: Internal Medicine

## 2008-12-20 ENCOUNTER — Encounter: Admission: RE | Admit: 2008-12-20 | Discharge: 2008-12-20 | Payer: Self-pay | Admitting: Internal Medicine

## 2009-01-03 ENCOUNTER — Telehealth: Payer: Self-pay | Admitting: *Deleted

## 2009-01-15 ENCOUNTER — Ambulatory Visit: Payer: Self-pay | Admitting: Internal Medicine

## 2009-01-16 ENCOUNTER — Encounter: Payer: Self-pay | Admitting: Internal Medicine

## 2009-01-16 LAB — CONVERTED CEMR LAB
AST: 67 units/L — ABNORMAL HIGH (ref 0–37)
Alkaline Phosphatase: 246 units/L — ABNORMAL HIGH (ref 39–117)
Bilirubin, Direct: 0.3 mg/dL (ref 0.0–0.3)
Total Bilirubin: 0.8 mg/dL (ref 0.3–1.2)

## 2009-01-22 ENCOUNTER — Ambulatory Visit: Payer: Self-pay | Admitting: Internal Medicine

## 2009-01-22 DIAGNOSIS — R945 Abnormal results of liver function studies: Secondary | ICD-10-CM

## 2009-01-22 HISTORY — DX: Abnormal results of liver function studies: R94.5

## 2009-02-10 ENCOUNTER — Encounter: Payer: Self-pay | Admitting: Internal Medicine

## 2009-02-17 ENCOUNTER — Telehealth: Payer: Self-pay | Admitting: Cardiology

## 2009-03-04 LAB — HM MAMMOGRAPHY: HM Mammogram: ABNORMAL

## 2009-03-10 ENCOUNTER — Encounter: Payer: Self-pay | Admitting: Internal Medicine

## 2009-03-10 ENCOUNTER — Encounter: Payer: Self-pay | Admitting: Cardiology

## 2009-05-05 ENCOUNTER — Encounter: Payer: Self-pay | Admitting: Internal Medicine

## 2009-05-12 ENCOUNTER — Encounter: Payer: Self-pay | Admitting: Internal Medicine

## 2009-05-27 ENCOUNTER — Ambulatory Visit: Payer: Self-pay | Admitting: Cardiology

## 2009-05-28 ENCOUNTER — Encounter: Payer: Self-pay | Admitting: Internal Medicine

## 2009-06-03 ENCOUNTER — Ambulatory Visit: Payer: Self-pay | Admitting: Cardiology

## 2009-06-03 DIAGNOSIS — I10 Essential (primary) hypertension: Secondary | ICD-10-CM | POA: Insufficient documentation

## 2009-06-09 LAB — CONVERTED CEMR LAB
BUN: 23 mg/dL (ref 6–23)
CO2: 31 meq/L (ref 19–32)
Calcium: 9.2 mg/dL (ref 8.4–10.5)
Creatinine, Ser: 0.8 mg/dL (ref 0.4–1.2)
Glucose, Bld: 93 mg/dL (ref 70–99)

## 2009-06-10 ENCOUNTER — Telehealth (INDEPENDENT_AMBULATORY_CARE_PROVIDER_SITE_OTHER): Payer: Self-pay | Admitting: *Deleted

## 2009-06-26 ENCOUNTER — Encounter: Payer: Self-pay | Admitting: Internal Medicine

## 2009-07-07 ENCOUNTER — Encounter: Payer: Self-pay | Admitting: Internal Medicine

## 2009-08-08 ENCOUNTER — Telehealth: Payer: Self-pay | Admitting: *Deleted

## 2009-09-12 ENCOUNTER — Encounter: Payer: Self-pay | Admitting: Cardiovascular Disease

## 2009-09-15 ENCOUNTER — Ambulatory Visit: Payer: Self-pay | Admitting: Cardiovascular Disease

## 2009-09-26 ENCOUNTER — Encounter: Payer: Self-pay | Admitting: Internal Medicine

## 2009-10-13 ENCOUNTER — Ambulatory Visit: Payer: Self-pay | Admitting: Internal Medicine

## 2009-10-14 ENCOUNTER — Telehealth: Payer: Self-pay | Admitting: *Deleted

## 2009-10-27 ENCOUNTER — Encounter: Payer: Self-pay | Admitting: Internal Medicine

## 2009-11-01 ENCOUNTER — Ambulatory Visit: Payer: Self-pay | Admitting: Family Medicine

## 2009-11-05 ENCOUNTER — Telehealth: Payer: Self-pay | Admitting: *Deleted

## 2009-11-05 DIAGNOSIS — R059 Cough, unspecified: Secondary | ICD-10-CM | POA: Insufficient documentation

## 2009-11-05 DIAGNOSIS — R05 Cough: Secondary | ICD-10-CM

## 2009-11-06 ENCOUNTER — Ambulatory Visit: Payer: Self-pay | Admitting: Internal Medicine

## 2009-11-10 ENCOUNTER — Ambulatory Visit: Payer: Self-pay | Admitting: Internal Medicine

## 2009-11-10 LAB — CONVERTED CEMR LAB
Albumin: 3.2 g/dL — ABNORMAL LOW (ref 3.5–5.2)
Alkaline Phosphatase: 225 units/L — ABNORMAL HIGH (ref 39–117)
Basophils Relative: 0.3 % (ref 0.0–3.0)
CO2: 30 meq/L (ref 19–32)
Calcium: 9 mg/dL (ref 8.4–10.5)
Chloride: 104 meq/L (ref 96–112)
Eosinophils Absolute: 0.3 10*3/uL (ref 0.0–0.7)
HCT: 40.8 % (ref 36.0–46.0)
Hemoglobin: 14 g/dL (ref 12.0–15.0)
Lymphocytes Relative: 11.8 % — ABNORMAL LOW (ref 12.0–46.0)
MCHC: 34.4 g/dL (ref 30.0–36.0)
MCV: 93.7 fL (ref 78.0–100.0)
Neutro Abs: 6.8 10*3/uL (ref 1.4–7.7)
Potassium: 3.7 meq/L (ref 3.5–5.1)
RBC: 4.35 M/uL (ref 3.87–5.11)
Sodium: 142 meq/L (ref 135–145)
Total CHOL/HDL Ratio: 4
Total Protein: 5.9 g/dL — ABNORMAL LOW (ref 6.0–8.3)

## 2009-11-11 ENCOUNTER — Telehealth: Payer: Self-pay | Admitting: Internal Medicine

## 2009-11-17 ENCOUNTER — Ambulatory Visit: Payer: Self-pay | Admitting: Internal Medicine

## 2009-11-17 DIAGNOSIS — J441 Chronic obstructive pulmonary disease with (acute) exacerbation: Secondary | ICD-10-CM | POA: Insufficient documentation

## 2009-11-17 HISTORY — DX: Chronic obstructive pulmonary disease with (acute) exacerbation: J44.1

## 2009-11-25 ENCOUNTER — Encounter: Payer: Self-pay | Admitting: Internal Medicine

## 2009-12-05 ENCOUNTER — Encounter: Payer: Self-pay | Admitting: Internal Medicine

## 2009-12-09 ENCOUNTER — Encounter: Payer: Self-pay | Admitting: Internal Medicine

## 2009-12-12 ENCOUNTER — Observation Stay (HOSPITAL_COMMUNITY): Admission: EM | Admit: 2009-12-12 | Discharge: 2009-12-13 | Payer: Self-pay | Admitting: Emergency Medicine

## 2009-12-12 ENCOUNTER — Ambulatory Visit: Payer: Self-pay | Admitting: Cardiology

## 2009-12-12 ENCOUNTER — Encounter: Payer: Self-pay | Admitting: Cardiology

## 2009-12-12 ENCOUNTER — Encounter (INDEPENDENT_AMBULATORY_CARE_PROVIDER_SITE_OTHER): Payer: Self-pay | Admitting: *Deleted

## 2009-12-12 ENCOUNTER — Telehealth: Payer: Self-pay | Admitting: Cardiovascular Disease

## 2009-12-12 DIAGNOSIS — I200 Unstable angina: Secondary | ICD-10-CM

## 2009-12-12 HISTORY — DX: Unstable angina: I20.0

## 2010-01-01 ENCOUNTER — Encounter: Payer: Self-pay | Admitting: Physician Assistant

## 2010-01-02 ENCOUNTER — Ambulatory Visit: Payer: Self-pay

## 2010-01-02 ENCOUNTER — Encounter: Payer: Self-pay | Admitting: Cardiology

## 2010-01-02 ENCOUNTER — Ambulatory Visit: Payer: Self-pay | Admitting: Physician Assistant

## 2010-01-02 DIAGNOSIS — R0989 Other specified symptoms and signs involving the circulatory and respiratory systems: Secondary | ICD-10-CM | POA: Insufficient documentation

## 2010-01-09 ENCOUNTER — Encounter: Payer: Self-pay | Admitting: Cardiology

## 2010-01-09 ENCOUNTER — Telehealth: Payer: Self-pay | Admitting: Cardiology

## 2010-01-09 ENCOUNTER — Ambulatory Visit: Payer: Self-pay

## 2010-02-26 ENCOUNTER — Ambulatory Visit
Admission: RE | Admit: 2010-02-26 | Discharge: 2010-02-26 | Payer: Self-pay | Source: Home / Self Care | Attending: Family Medicine | Admitting: Family Medicine

## 2010-02-26 DIAGNOSIS — M25559 Pain in unspecified hip: Secondary | ICD-10-CM | POA: Insufficient documentation

## 2010-02-27 ENCOUNTER — Ambulatory Visit: Admit: 2010-02-27 | Payer: Self-pay | Admitting: Internal Medicine

## 2010-02-27 ENCOUNTER — Ambulatory Visit
Admission: RE | Admit: 2010-02-27 | Discharge: 2010-02-27 | Payer: Self-pay | Source: Home / Self Care | Attending: Internal Medicine | Admitting: Internal Medicine

## 2010-02-27 DIAGNOSIS — Z87442 Personal history of urinary calculi: Secondary | ICD-10-CM

## 2010-02-27 HISTORY — DX: Personal history of urinary calculi: Z87.442

## 2010-02-27 LAB — CONVERTED CEMR LAB
Bilirubin Urine: NEGATIVE
Ketones, urine, test strip: NEGATIVE
Nitrite: NEGATIVE
Specific Gravity, Urine: 1.01

## 2010-03-01 LAB — CONVERTED CEMR LAB
Basophils Relative: 0.6 % (ref 0.0–3.0)
Calcium: 9.8 mg/dL (ref 8.4–10.5)
GFR calc Af Amer: 78 mL/min
Hemoglobin: 12.7 g/dL (ref 12.0–15.0)
LDL Cholesterol: 55 mg/dL (ref 0–99)
Lymphocytes Relative: 22.7 % (ref 12.0–46.0)
MCV: 91 fL (ref 78.0–100.0)
Monocytes Absolute: 0.5 10*3/uL (ref 0.1–1.0)
Monocytes Relative: 6.8 % (ref 3.0–12.0)
Neutro Abs: 5.4 10*3/uL (ref 1.4–7.7)
Neutrophils Relative %: 68 % (ref 43.0–77.0)
PTH: 23.1 pg/mL (ref 14.0–72.0)
Platelets: 213 10*3/uL (ref 150–400)
Potassium: 3.8 meq/L (ref 3.5–5.1)
RDW: 13.1 % (ref 11.5–14.6)
Sodium: 143 meq/L (ref 135–145)
Vitamin B-12: 708 pg/mL (ref 211–911)

## 2010-03-05 NOTE — Letter (Signed)
Summary: [facility]  [facility]   Imported By: Maryln Gottron 07/03/2009 12:20:38  _____________________________________________________________________  External Attachment:    Type:   Image     Comment:   External Document

## 2010-03-05 NOTE — Miscellaneous (Signed)
Summary: Orders Update  Clinical Lists Changes  Orders: Added new Test order of Arterial Duplex Lower Extremity (Arterial Duplex Low) - Signed 

## 2010-03-05 NOTE — Letter (Signed)
[name] Office Note  [facility]   Imported By: Roderic Ovens 05/07/2009 13:58:26  _____________________________________________________________________  External Attachment:    Type:   Image     Comment:   External Document

## 2010-03-05 NOTE — Letter (Signed)
Summary: [facility]  [facility]   Imported By: Maryln Gottron 07/11/2009 11:23:29  _____________________________________________________________________  External Attachment:    Type:   Image     Comment:   External Document

## 2010-03-05 NOTE — Assessment & Plan Note (Signed)
Summary: [name] [name]   Visit Type:  1 year follow up Primary [name]:  [name]  CC:  bilateral leg pain.  History of Present Illness: 77 year-old woman presents for follow-up of lower extremity PAD. She is a very thin, elderly woman with intermittent claudication and ongoing tobacco use. She presents today for follow-up evaluation. The patient underwent lower extremity angiography in 2010 showing patent iliac stents, and long total occlusion of the left SFA. The patient reports stable, mildly lifestyle-limiting left calf claudication. Remarkably, she continues to maintain her lawn and mows the grass with a push mower. She underwent ABI's today that were stable from previous: 0.97 right and 0.58 left.  Current Medications (verified): 1)  Imdur 30 Mg Tb24 (Isosorbide Mononitrate) .Marland Kitchen.. 1 By Mouth Once Daily 2)  Atenolol 50 Mg Tabs (Atenolol) .Marland Kitchen.. 1 By Mouth Once Daily 3)  Norvasc 10 Mg Tabs (Amlodipine Besylate) .... Take 1 Tablet Once A Day 4)  Adult Aspirin Low Strength 81 Mg  Tbdp (Aspirin) .Marland Kitchen.. 1 Once Daily 5)  Vitamin D 1000 Unit Caps (Cholecalciferol) .Marland Kitchen.. 1 Cap Once Daily 6)  Tramadol Hcl 50 Mg Tabs (Tramadol Hcl) .... 1/2 Tab As Needed 7)  Voltaren 1 % Gel (Diclofenac Sodium) .... 2 Grams To Upper Extremities Up To Qid For Arthritis Pain  Maximum 8 Grams Per Day Per Joint. 8)  Allergy Relief 4 Mg Tabs (Chlorpheniramine Maleate) .... As Needed  Allergies: 1)  * Tequin (Gatifloxacin) 2)  * Oxycodone 3)  Verapamil Hcl Cr (Verapamil Hcl)  Past History:  Past medical history reviewed for relevance to current acute and chronic problems.  Past Medical History: CAROTID ARTERY DISEASE (ICD-433.10)last cath 11/05, last myoview neg 8/09   PERIPHERAL VASCULAR DISEASE (ICD-443.9) with intermittent claudication HYPERTENSION (ICD-401.9) CORONARY ARTERY DISEASE (ICD-414.00) THROMBOCYTOPENIA (ICD-287.5) BACK PAIN (ICD-7  spinal stenosis PERSONAL HISTORY OF URINARY CALCULI (ICD-V13.01) UNSPECIFIED  ANEMIA (ICD-285.9) HYPERLIPIDEMIA (ICD-272.4) HEAD TRAUMA, CLOSED (ICD-959.01) POST-TRAUMATIC HEADACHE UNSPECIFIED (ICD-339.20) ? of SHINGLES, RECURRENT (ICD-053.9) DERMATITIS (ICD-692.9) UNSPECIFIED VITAMIN D DEFICIENCY (ICD-268.9) HYPOTHYROIDISM (ICD-244.9) COPD (ICD-496) SPINAL STENOSIS (ICD-724.00) DEGENERATIVE JOINT DISEASE (ICD-715.90) POSTHERPETIC NEURALGIA (ICD-053.19) Hx of CARCINOMA, BASAL CELL (ICD-173.9) Chronic pain management foot and back per Dr. Ethelene Hal are probable degenerative joint disease  Vital Signs:  Patient profile:   77 year old female Menstrual status:  hysterectomy Height:      64 inches Weight:      100.25 pounds BMI:     17.27 Pulse rate:   76 / minute Pulse rhythm:   regular Resp:     18 per minute BP sitting:   124 / 56  (left arm) Cuff size:   small  Vitals Entered By: Vikki Ports (September 15, 2009 12:05 PM)  Physical Exam  General:  Well-developed frail elderly woman in no acute distress.  Skin is warm and dry.  HEENT is normal.  Neck is supple. No thyromegaly.  Chest is clear to auscultation with normal expansion.  Cardiovascular exam is regular rate and rhythm.  Abdominal exam nontender or distended. Abdominal bruit present. Extremities show no edema, very thin legs with 2+ femoral pulses, pedals palp on the right and not palp on the left.     Impression & Recommendations:  Problem # 1:  PERIPHERAL VASCULAR DISEASE (ICD-443.9) Pt with stable intermittent claudication secondary to long total occlusion of the left SFA. She has no evidence of progressive leg ischemia and no resting symptoms. Will continue observation at this time. Tobacco cessation discussed but she's not inclined to quit.  Patient  Instructions: 1)  Your physician recommends that you continue on your current medications as directed. Please refer to the Current Medication list given to you today. 2)  Your physician wants you to follow-up in:   1 YEAR. You will receive a  reminder letter in the mail two months in advance. If you don't receive a letter, please call our office to schedule the follow-up appointment.

## 2010-03-05 NOTE — Progress Notes (Signed)
Summary: please advise  Phone Note Call from Patient Call back at Home Phone [phone]   Caller: Patient Call For: Madelin Headings MD Summary of Call: pt is no better pt was seen on 11-01-2009 by doc blyth. pt decline to see doc today due to weather Initial call taken by: Heron Sabins,  November 11, 2009 2:25 PM  Follow-up for Phone Call        please have clinical triage   Difficult to  treat over the phone  Follow-up by: Madelin Headings MD,  November 12, 2009 9:40 AM  Additional Follow-up for Phone Call Additional follow up Details #1::        Spoke to pt- Pt states that she has taken all of her medication. Pt still has the sinus infection and ear pain. No fever. Pt states that sinus is yellow and goes down into her chest. Pt is taking Mucinex which helps. Symbicort doesn't seem to work. Pt is going to 2 puffs on the Symbicort. Pt uses the [facility]. Pt has d/c tusinnex. Additional Follow-up by: Romualdo Bolk, CMA Duncan Dull),  November 12, 2009 9:50 AM    Additional Follow-up for Phone Call Additional follow up Details #2::    empiric rx   antibioti cand increase symbicort   call with alarm features  .   Follow-up by: Madelin Headings MD,  November 12, 2009 5:52 PM  New/Updated Medications: ZITHROMAX 250 MG TABS (AZITHROMYCIN) take 2 by mouth first day then 1 by mouth once daily for 4 more days Prescriptions: ZITHROMAX 250 MG TABS (AZITHROMYCIN) take 2 by mouth first day then 1 by mouth once daily for 4 more days  #6 x 0   Entered and Authorized by:   Madelin Headings MD   Signed by:   Madelin Headings MD on 11/12/2009   Method used:   Electronically to        [facility] [address] (retail[facility]       [address]       [place], [place]  [address]       Ph: [phone]       Fax: [phone]   RxID:   [id number]   Appended Document: please advise Pt aware of this.

## 2010-03-05 NOTE — Progress Notes (Signed)
Summary: chest pressure  Phone Note Call from Patient   Caller: Patient [phone] Reason for Call: Talk to Nurse Summary of Call: pt having chest pressure Initial call taken by: Glynda Jaeger,  December 12, 2009 11:01 AM  Follow-up for Phone Call        I spoke with the pt. She statest that she has had chest pressure for about 3-4 days intermittently. Her discomfort is not related to activity and lasts less than a few minutes when it starts. She denies any other cardiac symptoms. She thinks this may be a little similar to previous stents being placed. She does not want to go to the ER b/c she doesn't think it feels that bad. I spoke with Dr. Jens Som and he is going to see her today. She is agreeable with this and will come now. She also explained to me that she was on Symbicort and stopped this about a month ago for her breathing. Follow-up by: Sherri Rad, RN, BSN,  December 12, 2009 11:11 AM

## 2010-03-05 NOTE — Progress Notes (Signed)
Summary: pt request call  Phone Note Call from Patient Call back at Home Phone [phone]   Caller: Patient Reason for Call: Talk to Nurse Initial call taken by: Judie Grieve,  January 09, 2010 1:59 PM  Follow-up for Phone Call        spoke with pt, she reports since the change in her meds at discharge her bp has been running 168/68 to 92/46 with the pulse in the upper 50's to 70's. she reports a slight headache since the increase in the isosorbide. will foward for dr Jens Som review Deliah Goody, RN  January 09, 2010 4:34 PM    Additional Follow-up for Phone Call Additional follow up Details #1::        Monitor BP and will adjust based on results Ferman Hamming, MD, [facility]  January 09, 2010 5:41 PM  pt aware Deliah Goody, RN  January 09, 2010 5:42 PM

## 2010-03-05 NOTE — Progress Notes (Signed)
Summary: copy of test results - august  Phone Note Call from Patient Call back at [facility] Phone [phone]   Caller: Patient Reason for Call: Talk to Nurse, Lab or Test Results Details for Reason: would like a copy of lab results from august.  Initial call taken by: Lorne Skeens,  February 17, 2009 4:00 PM  Follow-up for Phone Call        spoke with pt, labs placed in the mail Deliah Goody, RN  February 17, 2009 5:37 PM

## 2010-03-05 NOTE — Assessment & Plan Note (Signed)
Summary: foot check/njr   Vital Signs:  Patient profile:   77 year old female Menstrual status:  hysterectomy Weight:      101 pounds Pulse rate:   66 / minute BP sitting:   110 / 70  (left arm) Cuff size:   Peds  Vitals Entered By: Romualdo Bolk, CMA (AAMA) (October 13, 2009 8:10 AM) CC: 1)Pt is here because she can't cut her toenails due to the thickness of them.  2) Pt wants to discuss line in thumb nail and 3) Discuss dementia 4) discuss getting cholesterol done. Pt is not fasting today.   History of Present Illness: [name] [name] comes in today  for acute appt scheduled as a foot issue but has many concerns .  She saw Dr Excell Seltzer recently for per PVD and was felt to be stable and rec she  stop smoking. She "doesnt smoke that much"  ocassional  Sees urology Dr Annabell Howells  blood in urine.  Hayes: HAs een her for   abn lfts .  Toes  have very thick nails and hard to cut.  some deformity also .  Worried about memory remembering what she is doing .  no problems with calculations disorintation and paying bills on time. NO one else is concerned.that she knows.  Has line in nail please check .    Preventive Screening-Counseling & Management  Alcohol-Tobacco     Alcohol drinks/day: 1     Alcohol type: spirits     Smoking Status: quit     Packs/Day: 1/2     Year Quit: 2010     Tobacco Counseling: to quit use of tobacco products  Caffeine-Diet-Exercise     Caffeine use/day: 1     Does Patient Exercise: no  Current Medications (verified): 1)  Imdur 30 Mg Tb24 (Isosorbide Mononitrate) .Marland Kitchen.. 1 By Mouth Once Daily 2)  Atenolol 50 Mg Tabs (Atenolol) .Marland Kitchen.. 1 By Mouth Once Daily 3)  Norvasc 10 Mg Tabs (Amlodipine Besylate) .... Take 1 Tablet Once A Day 4)  Adult Aspirin Low Strength 81 Mg  Tbdp (Aspirin) .Marland Kitchen.. 1 Once Daily 5)  Vitamin D 1000 Unit Caps (Cholecalciferol) .Marland Kitchen.. 1 Cap Once Daily 6)  Tramadol Hcl 50 Mg Tabs (Tramadol Hcl) .... 1/2 Tab As Needed 7)  Voltaren 1 % Gel  (Diclofenac Sodium) .... 2 Grams To Upper Extremities Up To Qid For Arthritis Pain  Maximum 8 Grams Per Day Per Joint. 8)  Allergy Relief 4 Mg Tabs (Chlorpheniramine Maleate) .... As Needed 9)  Fish Oil 1000 Mg Caps (Omega-3 Fatty Acids) 10)  Gelnique  Allergies (verified): 1)  * Tequin (Gatifloxacin) 2)  * Oxycodone 3)  Verapamil Hcl Cr (Verapamil Hcl)  Past History:  Past medical, surgical, family and social histories (including risk factors) reviewed, and no changes noted (except as noted below).  Past Medical History: CAROTID ARTERY DISEASE (ICD-433.10)last cath 11/05, last myoview neg 8/09   PERIPHERAL VASCULAR DISEASE (ICD-443.9) with intermittent claudication HYPERTENSION (ICD-401.9) CORONARY ARTERY DISEASE (ICD-414.00) stendting RCA 2005  THROMBOCYTOPENIA (ICD-287.5) BACK PAIN (ICD-7  spinal stenosis PERSONAL HISTORY OF URINARY CALCULI (ICD-V13.01) UNSPECIFIED ANEMIA (ICD-285.9) HYPERLIPIDEMIA (ICD-272.4) HEAD TRAUMA, CLOSED (ICD-959.01) POST-TRAUMATIC HEADACHE UNSPECIFIED (ICD-339.20) ? of SHINGLES, RECURRENT (ICD-053.9) DERMATITIS (ICD-692.9) UNSPECIFIED VITAMIN D DEFICIENCY (ICD-268.9) HYPOTHYROIDISM (ICD-244.9) COPD (ICD-496) SPINAL STENOSIS (ICD-724.00) DEGENERATIVE JOINT DISEASE (ICD-715.90) POSTHERPETIC NEURALGIA (ICD-053.19) Hx of CARCINOMA, BASAL CELL (ICD-173.9) Chronic pain management foot and back per Dr. Ethelene Hal are probable degenerative joint disease Vitreous detachment  Past Surgical History: Reviewed history from  05/27/2009 and no changes required. Lumbar laminectomy Cataract extraction Hysterectomy total  Lumpectomy Tonsillectomy PTCA/stent - bilat iliac stents, left renal artery, and right coronary artery, last myoview 8/09 wtih EF 70% carotid stenosis - 50% bilat stenosis 8/09 left renal artery with mild prox stenosis 03/2008 right renal artery with diffuse mild restenosis - 03/2008 skin cancer surgery Breast ca right lumpectomy?  1998  radiation  Past History:  Care Management: Atha Starks [facility]    [facility]  Family History: Reviewed history from 01/22/2009 and no changes required. Family History Breast cancer 1st degree relative <50daughter deceased in 1997-06-27     Social History: Reviewed history from 01/22/2009 and no changes required. Current Smoker  stopped in Nov 08  some relapses Alcohol use-yessocial widowed 5 hours of sleep     Has friends checking on her      Review of Systems  The patient denies anorexia, fever, weight gain, vision loss, headaches, hemoptysis, melena, hematochezia, severe indigestion/heartburn, abnormal bleeding, and enlarged lymph nodes.    Physical Exam  General:  alert, well-developed, and well-hydrated.  slender in nad  Head:  normocephalic and atraumatic.   Eyes:  vision grossly intact.   Neck:  No deformities, masses, or tenderness noted. Lungs:  normal respiratory effort, no intercostal retractions, and no accessory muscle use.  dec bs no WRR Heart:  normal rate, regular rhythm, no gallop, no rub, and no lifts.   Pulses:  perfusion appears nl  Extremities:  no clubbing cyanosis or edema  Neurologic:  alert & oriented X3 and gait normal.   Pt is A&Ox3,affect,speech,memory,attention,&motor skills appear intact.  Skin:  nails very t hichened in feet no ulcers    few calluses . one finger with faint line of color no nevus  no dark spot of irregularity Cervical Nodes:  No lymphadenopathy noted Psych:  Oriented X3, normally interactive, good eye contact, not anxious appearing, and not depressed appearing.     Impression & Recommendations:  Problem # 1:  UNSPECIFIED DISEASE OF NAIL (ICD-703.9) poss dermatophytes vs other   agree to get pod to see her  Problem # 2:  PERIPHERAL VASCULAR DISEASE (ICD-443.9) Assessment: Unchanged stop tobacco   Problem # 3:  HYPERTENSION (ICD-401.9)  Her updated medication list for this problem  includes:    Atenolol 50 Mg Tabs (Atenolol) .Marland Kitchen... 1 by mouth once daily    Norvasc 10 Mg Tabs (Amlodipine besylate) .Marland Kitchen... Take 1 tablet once a day  BP today: 110/70 Prior BP: 124/56 (09/15/2009)  Prior 10 Yr Risk Heart Disease: N/A (01/22/2009)  Labs Reviewed: K+: 3.5 (06/03/2009) Creat: : 0.8 (06/03/2009)   Chol: 108 (09/02/2008)   HDL: 45.90 (09/02/2008)   LDL: 55 (09/02/2008)   TG: 38.0 (09/02/2008)  Problem # 4:  HYPERLIPIDEMIA (ICD-272.4) statins rec usually with pvd     had abnoal lfts poss from [facility]    will check level for her     needs to stop tobacco all together Labs Reviewed: SGOT: 67 (01/16/2009)   SGPT: 66 (01/16/2009)  Prior 10 Yr Risk Heart Disease: N/A (01/22/2009)   HDL:45.90 (09/02/2008), 38.9 (02/21/2008)  LDL:55 (09/02/2008), 62 (02/21/2008)  Chol:108 (09/02/2008), 111 (02/21/2008)  Trig:38.0 (09/02/2008), 53 (02/21/2008)  Problem # 5:  concern about memory  by exam and  interview today   no evidence of sig  impairmnet except  that from age.   formal testing not done .   Problem # 6:  CORONARY ARTERY DISEASE (ICD-414.00) stable  refill meds   Her updated medication list for this problem includes:    Imdur 30 Mg Tb24 (Isosorbide mononitrate) .Marland Kitchen... 1 by mouth once daily    Atenolol 50 Mg Tabs (Atenolol) .Marland Kitchen... 1 by mouth once daily    Norvasc 10 Mg Tabs (Amlodipine besylate) .Marland Kitchen... Take 1 tablet once a day    Adult Aspirin Low Strength 81 Mg Tbdp (Aspirin) .Marland Kitchen... 1 once daily  Complete Medication List: 1)  Imdur 30 Mg Tb24 (Isosorbide mononitrate) .Marland Kitchen.. 1 by mouth once daily 2)  Atenolol 50 Mg Tabs (Atenolol) .Marland Kitchen.. 1 by mouth once daily 3)  Norvasc 10 Mg Tabs (Amlodipine besylate) .... Take 1 tablet once a day 4)  Adult Aspirin Low Strength 81 Mg Tbdp (Aspirin) .Marland Kitchen.. 1 once daily 5)  Vitamin D 1000 Unit Caps (Cholecalciferol) .Marland Kitchen.. 1 cap once daily 6)  Tramadol Hcl 50 Mg Tabs (Tramadol hcl) .... 1/2 tab as needed 7)  Voltaren 1 % Gel (Diclofenac sodium) .... 2  grams to upper extremities up to qid for arthritis pain  maximum 8 grams per day per joint. 8)  Allergy Relief 4 Mg Tabs (Chlorpheniramine maleate) .... As needed 9)  Fish Oil 1000 Mg Caps (Omega-3 fatty acids) 10)  Gelnique   Other Orders: Flu Vaccine 31yrs + MEDICARE PATIENTS (Z6109) Administration Flu vaccine - MCR (U0454) Podiatry Referral (Podiatry)  Patient Instructions: 1)  will  do podiatry referral. 2)  stop smoking . all together  3)  schedule for fasting LIPID,LFTS, CBCDiff and plt TSH and BMP , ESR  4)  dx PVD, Abnormal lfts, Hyperlipidemia , Arthritis  5)  THen  cpx / wellness visit  ( 2 slots  )     not on a thursday .   Prevention & Chronic Care Immunizations   Influenza vaccine: Fluvax 3+  (10/13/2009)    Tetanus booster: Not documented    Pneumococcal vaccine: Not documented    H. zoster vaccine: Not documented  Colorectal Screening   Hemoccult: Not documented    Colonoscopy: Not documented  Other Screening   Pap smear: Not documented    Mammogram: Not documented    DXA bone density scan: Not documented   Smoking status: quit  (10/13/2009)  Lipids   Total Cholesterol: 108  (09/02/2008)   LDL: 55  (09/02/2008)   LDL Direct: Not documented   HDL: 45.90  (09/02/2008)   Triglycerides: 38.0  (09/02/2008)    SGOT (AST): 67  (01/16/2009)   SGPT (ALT): 66  (01/16/2009)   Alkaline phosphatase: 246  (01/16/2009)   Total bilirubin: 0.8  (01/16/2009)  Hypertension   Last Blood Pressure: 110 / 70  (10/13/2009)   Serum creatinine: 0.8  (06/03/2009)   Serum potassium 3.5  (06/03/2009)  Self-Management Support :    Hypertension self-management support: Not documented    Lipid self-management support: Not documented             Flu Vaccine Consent Questions     Do you have a history of severe allergic reactions to this vaccine? no    Any prior history of allergic reactions to egg and/or gelatin? no    Do you have a sensitivity to the  preservative Thimersol? no    Do you have a past history of Guillan-Barre Syndrome? no    Do you currently have an acute febrile illness? no    Have you ever had a severe reaction to latex? no    Vaccine information given and explained to patient? yes  Are you currently pregnant? no    Lot Number:[id number]   Exp Date:08/01/2010   Site Given  Left Deltoid IMflu Romualdo Bolk, CMA (AAMA)  October 13, 2009 8:17 AM

## 2010-03-05 NOTE — Consult Note (Signed)
Summary: [facility]  [facility]   Imported By: Maryln Gottron 02/17/2009 10:42:56  _____________________________________________________________________  External Attachment:    Type:   Image     Comment:   External Document

## 2010-03-05 NOTE — Assessment & Plan Note (Signed)
Summary: cpx---ok per dr panosh//ccm   Vital Signs:  Patient profile:   77 year old female Menstrual status:  hysterectomy Height:      63.5 inches Weight:      99.5 pounds O2 Sat:      91 % on Room air Pulse rate:   70 / minute BP sitting:   110 / 74  (left arm) Cuff size:   regular  Vitals Entered By: Romualdo Bolk, CMA (AAMA) (November 17, 2009 10:32 AM)  O2 Flow:  Room air  Contraindications/Deferment of Procedures/Staging:    Test/Procedure: PAP Smear    Reason for deferment: hysterectomy   CC: Annual Visit for Disease Management   History of Present Illness: [name] [name]  comes in for follow up of  medical problems and also wellness Since last visit she  has had acute exacerbation of COPD and has antibioitc and prednisone ( see notes ) and is ome better but srtill has lots of phegm and coughs .NO recentl fever and no hemoptysis .  Here for Medicare AWV:  1.   Risk factors based on Past M, S, F history: 2.   Physical Activities:  no exercise  3.   Depression/mood:   no  4.   Hearing:  ok except for ear aches with sinusitis  5.   ADL's:  does all herself  6.   Fall Risk:   No recent falling.   has fallen in the past  7.   Home Safety:    reviewed .  8.   Height, weight, &visual acuity:  done by Dr Hazle Quant .  VItreous detachment   bilaterall   9.   Counseling:  10.   Labs ordered based on risk factors:  11.           Referral Coordination 12.           Care Plan 13.            Cognitive Assessment    Pt is A&Ox3,affect,speech,memory,attention,&motor skills appear intact.  To see  Dr Madilyn Fireman  soon   has   abnormal lfts  ? cause   Urologist:   New med ?  for her UI if can take  with liver condition.  Vascular :  PVD    no change   Preventive Care Screening  Mammogram:    Date:  03/04/2009    Results:  abnormal   Prior Values:    PPD:  negative (06/11/2008)    Last Flu Shot:  Fluvax 3+ (10/13/2009)   Preventive Screening-Counseling &  Management  Alcohol-Tobacco     Alcohol drinks/day: 1     Alcohol type: spirits     Smoking Status: quit     Packs/Day: 1/2     Year Quit: 2010     Tobacco Counseling: to quit use of tobacco products  Caffeine-Diet-Exercise     Caffeine use/day: 1     Does Patient Exercise: no     MSH Depression Score: no  Hep-HIV-STD-Contraception     Dental Visit-last 6 months no  Safety-Violence-Falls     Seat Belt Use: 100     Firearms in the Home: no firearms in the home     Smoke Detectors: yes     Fall Risk: no falling   Current Medications (verified): 1)  Imdur 30 Mg Tb24 (Isosorbide Mononitrate) .Marland Kitchen.. 1 By Mouth Once Daily 2)  Atenolol 50 Mg Tabs (Atenolol) .Marland Kitchen.. 1 By Mouth Once Daily 3)  Norvasc 10  Mg Tabs (Amlodipine Besylate) .... Take 1 Tablet Once A Day 4)  Adult Aspirin Low Strength 81 Mg  Tbdp (Aspirin) .Marland Kitchen.. 1 Once Daily 5)  Vitamin D 1000 Unit Caps (Cholecalciferol) .Marland Kitchen.. 1 Cap Once Daily 6)  Tramadol Hcl 50 Mg Tabs (Tramadol Hcl) .... 1/2 Tab As Needed 7)  Voltaren 1 % Gel (Diclofenac Sodium) .... 2 Grams To Upper Extremities Up To Qid For Arthritis Pain  Maximum 8 Grams Per Day Per Joint. 8)  Allergy Relief 4 Mg Tabs (Chlorpheniramine Maleate) .... As Needed 9)  Fish Oil 1000 Mg Caps (Omega-3 Fatty Acids) 10)  Gelnique 11)  Symbicort 160-4.5 Mcg/act Aero (Budesonide-Formoterol Fumarate) .Marland Kitchen.. 1 Puff By Mouth Two Times A Day 12)  Tussionex Pennkinetic Er 10-8 Mg/44ml Lqcr (Hydrocod Polst-Chlorphen Polst) .Marland Kitchen.. 1 Tsp By Mouth Two Times A Day As Needed Cough 13)  Oxybutynin Chloride 10 Mg Xr24h-Tab (Oxybutynin Chloride) .Marland Kitchen.. 1 By Mouth Once Daily  Allergies (verified): 1)  * Tequin (Gatifloxacin) 2)  * Oxycodone 3)  Verapamil Hcl Cr (Verapamil Hcl)  Past History:  Past medical, surgical, family and social histories (including risk factors) reviewed, and no changes noted (except as noted below).  Past Medical History: Reviewed history from 10/13/2009 and no changes  required. CAROTID ARTERY DISEASE (ICD-433.10)last cath 11/05, last myoview neg 8/09   PERIPHERAL VASCULAR DISEASE (ICD-443.9) with intermittent claudication HYPERTENSION (ICD-401.9) CORONARY ARTERY DISEASE (ICD-414.00) stendting RCA 22-Jun-2003  THROMBOCYTOPENIA (ICD-287.5) BACK PAIN (ICD-7  spinal stenosis PERSONAL HISTORY OF URINARY CALCULI (ICD-V13.01) UNSPECIFIED ANEMIA (ICD-285.9) HYPERLIPIDEMIA (ICD-272.4) HEAD TRAUMA, CLOSED (ICD-959.01) POST-TRAUMATIC HEADACHE UNSPECIFIED (ICD-339.20) ? of SHINGLES, RECURRENT (ICD-053.9) DERMATITIS (ICD-692.9) UNSPECIFIED VITAMIN D DEFICIENCY (ICD-268.9) HYPOTHYROIDISM (ICD-244.9) COPD (ICD-496) SPINAL STENOSIS (ICD-724.00) DEGENERATIVE JOINT DISEASE (ICD-715.90) POSTHERPETIC NEURALGIA (ICD-053.19) Hx of CARCINOMA, BASAL CELL (ICD-173.9) Chronic pain management foot and back per Dr. Ethelene Hal are probable degenerative joint disease Vitreous detachment  Past Surgical History: Reviewed history from 05/27/2009 and no changes required. Lumbar laminectomy Cataract extraction Hysterectomy total  Lumpectomy Tonsillectomy PTCA/stent - bilat iliac stents, left renal artery, and right coronary artery, last myoview 8/09 wtih EF 70% carotid stenosis - 50% bilat stenosis 8/09 left renal artery with mild prox stenosis 03/2008 right renal artery with diffuse mild restenosis - 03/2008 skin cancer surgery Breast ca right lumpectomy?  1998 radiation  Past History:  Care Management: A[facility] St[facility]    [facility]  Family History: Reviewed history from 01/22/2009 and no changes required. Family History Breast cancer 1st degree relative <50daughter deceased in 21-Jun-1997     Social History: Reviewed history from 01/22/2009 and no changes required. Current Smoker  stopped in Nov 08  some relapses Alcohol use-yessocial widowed 5 hours of sleep     Has friends checking on her     no pets  Dental Care w/in 6 mos.:   no Fall Risk:  no falling   Review of Systems  The patient denies anorexia, fever, abdominal pain, melena, hematochezia, severe indigestion/heartburn, hematuria, transient blindness, difficulty walking, depression, unusual weight change, abnormal bleeding, enlarged lymph nodes, angioedema, and breast masses.         yeast infection from    antibiotics.   decrease hearing right ear ache  with the RTI  right .    feels weak always  at joints  .     Physical Exam  General:  alert, well-developed, and well-hydrated.  very thin in nad  nl speech some hoarse ness Head:  normocephalic and atraumatic.  Eyes:  vision grossly intact.   Ears:  R ear normal and L ear normal.   Nose:  no face no external deformity and no external erythema.   tenderness Mouth:  pharynx pink and moist.   Neck:  No deformities, masses, or tenderness noted. Breasts:  No mass, nodules, thickening, tenderness, bulging, retraction, inflamation, nipple discharge or skin changes noted.   Lungs:  normal respiratory effort, no intercostal retractions, no accessory muscle use, and no dullness.     diffuse  large airway sounds  and  rhinchi no rales   equal BS somewhat decreased  repeat pulse ox 91%  Heart:  normal rate, regular rhythm, no murmur, no gallop, and no lifts.   Abdomen:  Bowel sounds positive,abdomen soft and non-tender without masses, organomegaly or hernias noted. Genitalia:  hysterectomy Msk:  slender decrease MM  no acute swelling Pulses:  present some decrease left foot Extremities:  no clubbing cyanosis or edema    Neurologic:  alert & oriented X3.  non focal  slow gait  care with getting on table  Pt is A&Ox3,affect,speech,memory,attention,&motor skills appear intact.  Skin:  no ecchymoses and no petechiae.  aged skin changes   nails thichened  no ulcers on feet and nl cap refill  Cervical Nodes:  No lymphadenopathy noted Psych:  Oriented X3.   Normal eye contact, appropriate affect. Cognition appears  normal.    Impression & Recommendations:  Problem # 1:  PREVENTIVE HEALTH CARE (ICD-V70.0) no tobacco   .  helathy eating   fall prevention.   sleep , stop tobacco  Problem # 2:  CHRONIC OBSTRUCTIVE PULMONARY DISEASE, ACUTE EXACERBATION (ICD-491.21) Assessment: Improved  still ongoing  stopped tobacco again Etpt 30    suspect  still having some .    counseled about  importtance of no tobacco.   boost with depomedrol today   .  increase symbicort to 2 puff two times a day   doesnt have rx just    may benefit from sipriva   at follow up .   Orders: Depo- Medrol 80mg  (J1040) Admin of Therapeutic Inj  intramuscular or subcutaneous (16109)  Problem # 3:  LIVER FUNCTION TESTS, ABNORMAL (ICD-794.8) worse  with elevated alk phos  ? cause     not on a statin   Problem # 4:  PERIPHERAL VASCULAR DISEASE (ICD-443.9) ongoing    no new signs  follow up with specialist   Problem # 5:  PERSONAL HISTORY OF MALIGNANT NEOPLASM OF BREAST (ICD-V10.3) due for 6 months surveillance no mass felt  Problem # 6:  UNDERWEIGHT (UEA-540.98) Assessment: Unchanged  Complete Medication List: 1)  Imdur 30 Mg Tb24 (Isosorbide mononitrate) .Marland Kitchen.. 1 by mouth once daily 2)  Atenolol 50 Mg Tabs (Atenolol) .Marland Kitchen.. 1 by mouth once daily 3)  Norvasc 10 Mg Tabs (Amlodipine besylate) .... Take 1 tablet once a day 4)  Adult Aspirin Low Strength 81 Mg Tbdp (Aspirin) .Marland Kitchen.. 1 once daily 5)  Vitamin D 1000 Unit Caps (Cholecalciferol) .Marland Kitchen.. 1 cap once daily 6)  Tramadol Hcl 50 Mg Tabs (Tramadol hcl) .... 1/2 tab as needed 7)  Voltaren 1 % Gel (Diclofenac sodium) .... 2 grams to upper extremities up to qid for arthritis pain  maximum 8 grams per day per joint. 8)  Allergy Relief 4 Mg Tabs (Chlorpheniramine maleate) .... As needed 9)  Fish Oil 1000 Mg Caps (Omega-3 fatty acids) 10)  Gelnique  11)  Symbicort 160-4.5 Mcg/act Aero (Budesonide-formoterol fumarate) .Marland Kitchen.. 1 puff by  mouth two times a day 12)  Tussionex Pennkinetic Er 10-8  Mg/58ml Lqcr (Hydrocod polst-chlorphen polst) .Marland Kitchen.. 1 tsp by mouth two times a day as needed cough 13)  Oxybutynin Chloride 10 Mg Xr24h-tab (Oxybutynin chloride) .Marland Kitchen.. 1 by mouth once daily  Patient Instructions: 1)  will send copy of results to  Dr Madilyn Fireman.  about your liver tests.  2)  NO tobacco. 3)  ROV  regarding   lungs in 1 month. 4)  your liver tests are abnormal  still and some worse .    Medication Administration  Injection # 1:    Medication: Depo- Medrol 80mg     Diagnosis: CHRONIC OBSTRUCTIVE PULMONARY DISEASE, ACUTE EXACERBATION (ICD-491.21)    Route: IM    Site: RUOQ gluteus    Exp Date: 07/02/2012    Lot #: [id number]    Mfr: [employer]    Comments: Gave 120mg     Patient tolerated injection without complications    Given by: Romualdo Bolk, CMA Duncan Dull) (November 17, 2009 11:56 AM)     Eye Exam  Last Eye Exam- 11/01/2009- Normal except for detactment of _____.  Done by Dr. Hazle Quant. Romualdo Bolk, CMA Duncan Dull)  November 17, 2009 10:38 AM

## 2010-03-05 NOTE — Letter (Signed)
Summary: [facility]  [facility]   Imported By: Maryln Gottron 10/14/2009 12:10:28  _____________________________________________________________________  External Attachment:    Type:   Image     Comment:   External Document

## 2010-03-05 NOTE — Assessment & Plan Note (Signed)
Summary: in terrible pain//ccm   Vital Signs:  Patient profile:   77 year old female Menstrual status:  hysterectomy Weight:      101 pounds Temp:     98.3 degrees F oral BP sitting:   120 / 70  (left arm) Cuff size:   regular  Vitals Entered By: Sid Falcon LPN (February 26, 2010 3:17 PM) CC: "In terrible pain" back, left hip, groin   History of Present Illness: patient seen as a work in with 3 to four-day history of progressive pain poorly localized left low back and hip region.  Recent hx of carrying heavy dishes but denies any specific injury. Pain is a burning quality which radiates from the low back toward the inner groin region. 8/10 severity. Worse with walking and movement. No recent falls. Tramadol minimal relief. Denies any skin rashes, fever, dysuria, or change in stool habits. History of spinal stenosis. No urine incontinence.  peripheral vascular disease with recent arterial Doppler. Multiple risk factors for peripheral vascular disease including ongoing nicotine use. Claudication symptoms intermittently. Left foot she states is chronically cool to touch compared to the right.  Allergies: 1)  * Tequin (Gatifloxacin) 2)  * Oxycodone 3)  Verapamil Hcl Cr (Verapamil Hcl)  Past History:  Past Medical History: Last updated: 01/02/2010 CAROTID ARTERY DISEASE (ICD-433.10)last cath 11/05, last myoview neg 8/09   PERIPHERAL VASCULAR DISEASE (ICD-443.9) with intermittent claudication HYPERTENSION (ICD-401.9) CORONARY ARTERY DISEASE (ICD-414.00) stendting RCA 06/22/03    a.  cath 12/13/2009:  LAD prox luminal irregularities; D1 ostial 25%; CFX mid 25%; RCA stent 30%; EF 65%  THROMBOCYTOPENIA (ICD-287.5) spinal stenosis PERSONAL HISTORY OF URINARY CALCULI (ICD-V13.01) UNSPECIFIED ANEMIA (ICD-285.9) HYPERLIPIDEMIA (ICD-272.4) HEAD TRAUMA, CLOSED (ICD-959.01) POST-TRAUMATIC HEADACHE UNSPECIFIED (ICD-339.20) ? of SHINGLES, RECURRENT (ICD-053.9) DERMATITIS  (ICD-692.9) UNSPECIFIED VITAMIN D DEFICIENCY (ICD-268.9) HYPOTHYROIDISM (ICD-244.9) COPD (ICD-496) SPINAL STENOSIS (ICD-724.00) POSTHERPETIC NEURALGIA (ICD-053.19) Hx of CARCINOMA, BASAL CELL (ICD-173.9) Chronic pain management foot and back per Dr. Ethelene Hal are probable degenerative joint disease Vitreous detachment  Past Surgical History: Last updated: 12/12/2009 Lumbar laminectomy Cataract extraction Hysterectomy total  Lumpectomy Tonsillectomy PTCA/stent - bilat iliac stents, left renal artery, and right coronary artery, last myoview 8/09 wtih EF 70% skin cancer surgery Breast ca right lumpectomy?  1998 radiation  Family History: Last updated: 01/22/2009 Family History Breast cancer 1st degree relative <50daughter deceased in Jun 21, 1997     Social History: Last updated: 11/17/2009 Current Smoker  stopped in Nov 08  some relapses Alcohol use-yessocial widowed 5 hours of sleep     Has friends checking on her     no pets   Risk Factors: Alcohol Use: 1 (11/17/2009) Caffeine Use: 1 (11/17/2009) Exercise: no (11/17/2009)  Risk Factors: Smoking Status: quit (11/17/2009) Packs/Day: 1/2 (11/17/2009) PMH-FH-SH reviewed for relevance  Review of Systems       The patient complains of dyspnea on exertion.  The patient denies anorexia, fever, weight loss, chest pain, syncope, peripheral edema, headaches, hemoptysis, abdominal pain, melena, hematochezia, and severe indigestion/heartburn.    Physical Exam  General:  patient is a thin and somewhat cachectic in appearance. Head:  Normocephalic and atraumatic without obvious abnormalities. No apparent alopecia or balding. Mouth:  Oral mucosa and oropharynx without lesions or exudates.  Teeth in good repair. Neck:  No deformities, masses, or tenderness noted. Lungs:  diminished breath sounds throughout. No wheezes or rales Heart:  normal rate and regular rhythm.   Abdomen:  soft and non-tender.   Msk:  no spinal  tenderness Extremities:  left foot is cool to touch compared to the right-?chronic. Absent dorsalis pedis pulse on the left 1+ on the right. Slow capillary refill left foot. Diminished but palpable femoral pulse left 1+ on the right. Patient has some pain with internal and external rotation left hip which is fairly minimal. Does have palpable L femoral pulse, though weaker than R. Neurologic:  alert & oriented X3 and cranial nerves II-XII intact.     Impression & Recommendations:  Problem # 1:  HIP PAIN (ICD-719.45) Assessment New ?vascular in pt with known PVD.  Given radiation pattern toward inner groin, hip x-rays. No hx of trauma.  Consider referral back to Dr Excell Seltzer to reassess. Her updated medication list for this problem includes:    Adult Aspirin Low Strength 81 Mg Tbdp (Aspirin) .Marland Kitchen... 1 once daily    Tramadol Hcl 50 Mg Tabs (Tramadol hcl) .Marland Kitchen... 1/2 tab as needed  Orders: T-Hip Comp Left Min 2-views (73510TC)  Problem # 2:  PERIPHERAL VASCULAR DISEASE (ICD-443.9)  Complete Medication List: 1)  Imdur 60 Mg Xr24h-tab (Isosorbide mononitrate) .... Take 1 tablet by mouth once a day 2)  Atenolol 50 Mg Tabs (Atenolol) .... 1/2 by mouth daily 3)  Norvasc 10 Mg Tabs (Amlodipine besylate) .... Take 1 tablet once a day 4)  Adult Aspirin Low Strength 81 Mg Tbdp (Aspirin) .Marland Kitchen.. 1 once daily 5)  Vitamin D 1000 Unit Caps (Cholecalciferol) .Marland Kitchen.. 1 cap once daily 6)  Tramadol Hcl 50 Mg Tabs (Tramadol hcl) .... 1/2 tab as needed 7)  Voltaren 1 % Gel (Diclofenac sodium) .... 2 grams to upper extremities up to qid for arthritis pain  maximum 8 grams per day per joint. 8)  Fish Oil 1000 Mg Caps (Omega-3 fatty acids) .Marland Kitchen.. 1 tab by mouth once daily  Patient Instructions: 1)  Schedule followup with Dr. Fabian Sharp in one to 2 weeks to reassess. Followup sooner if you have any progressive worsening of left hip and lower extremity pain   Orders Added: 1)  T-Hip Comp Left Min 2-views [73510TC] 2)  Est.  Patient Level IV [16109]

## 2010-03-05 NOTE — Letter (Signed)
Summary: [facility]  [facility]   Imported By: Maryln Gottron 08/05/2009 15:30:17  _____________________________________________________________________  External Attachment:    Type:   Image     Comment:   External Document

## 2010-03-05 NOTE — Progress Notes (Signed)
Summary: chest not better  Phone Note Call from Patient Call back at Home Phone [phone]   Caller: vm Reason for Call: Talk to Nurse Summary of Call: Sat ov.  Saw Dr. Abner Greenspan.   Chest not better.   T 99 this am.  Coughing & wheezing.  Light yellow mucus.  Nasal clear.  Augmentin 875 two times a day 10 days.  Prednisone 20mg  2 daily x 5days, finished today.  Symbicort sample 1 puff two times a day.  Did not get the Tussionex cough syrup.  Delsym no help.   Mucinex started today.  Only drank tea & 4 cups coffee today.  Eating a little.  Is hungry.  No CXR this time.  [facility] [place].  Allergic to Teclin - is on her chart.  wants this to go to Dr. Fabian Sharp & would like a callback ASAP.  Rudy Jew, RN  November 05, 2009 4:38 PM      Initial call taken by: Rudy Jew, RN,  November 05, 2009 4:10 PM  Follow-up for Phone Call        ok to get chest x ray tomorrow and then plan rov . if she is short of breath or fever she should go to the ed.  Follow-up by: Madelin Headings MD,  November 05, 2009 5:38 PM  Additional Follow-up for Phone Call Additional follow up Details #1::        Pt aware of this and will get cxr in am.  Additional Follow-up by: Romualdo Bolk, CMA (AAMA),  November 05, 2009 5:41 PM  New Problems: COUGH (ICD-786.2)   New Problems: COUGH (ICD-786.2)

## 2010-03-05 NOTE — Assessment & Plan Note (Signed)
Summary: hip pain/ssc   Vital Signs:  Patient profile:   77 year old female Menstrual status:  hysterectomy Weight:      106 pounds Pulse rate:   60 / minute BP sitting:   120 / 60  (left arm) Cuff size:   regular  Vitals Entered By: Romualdo Bolk, CMA (AAMA) (February 27, 2010 9:52 AM)  History of Present Illness: [name] [name]   comes in today having been seen yesterday by colleague Dr. Caryl Never for acute onset of left hip back and leg pain. she describes the pain as burning and worse when she moves and severe. Dr. Murtis Sink noted decreased pulse in her left foot and felt her feet were cool but not cold. yesterday her pain was 9/10.   after increasing her tramadol to 2 as needed it is a bit  better 7-8/10  .     no nausea vomiting diarrhea or falling. she denies focal weakness.  She continues to smoke occasionally.  She saw Dr.  Jens Som recently for some chest symptoms and he increased her imdur and decreased her beta blocker.   she states she does have a history of kidney stone but doesn't think this is it.  Preventive Screening-Counseling & Management  Alcohol-Tobacco     Alcohol drinks/day: 1     Alcohol type: spirits     Smoking Status: quit     Packs/Day: 1/2     Year Quit: 2010     Tobacco Counseling: to quit use of tobacco products  Caffeine-Diet-Exercise     Caffeine use/day: 1     Does Patient Exercise: no     MSH Depression Score: no  Current Medications (verified): 1)  Imdur 60 Mg Xr24h-Tab (Isosorbide Mononitrate) .... Take 1 Tablet By Mouth Once A Day 2)  Atenolol 50 Mg Tabs (Atenolol) .... 1/2 By Mouth Daily 3)  Norvasc 10 Mg Tabs (Amlodipine Besylate) .... Take 1 Tablet Once A Day 4)  Adult Aspirin Low Strength 81 Mg  Tbdp (Aspirin) .Marland Kitchen.. 1 Once Daily 5)  Vitamin D 1000 Unit Caps (Cholecalciferol) .Marland Kitchen.. 1 Cap Once Daily 6)  Tramadol Hcl 50 Mg Tabs (Tramadol Hcl) .... 1/2 Tab As Needed 7)  Voltaren 1 % Gel (Diclofenac Sodium) .... 2 Grams To Upper  Extremities Up To Qid For Arthritis Pain  Maximum 8 Grams Per Day Per Joint. 8)  Fish Oil 1000 Mg Caps (Omega-3 Fatty Acids) .Marland Kitchen.. 1 Tab By Mouth Once Daily  Allergies (verified): 1)  * Tequin (Gatifloxacin) 2)  * Oxycodone 3)  Verapamil Hcl Cr (Verapamil Hcl)  Past History:  Past medical, surgical, family and social histories (including risk factors) reviewed, and no changes noted (except as noted below).  Past Medical History: CAROTID ARTERY DISEASE (ICD-433.10)last cath 11/05, last myoview neg 8/09   PERIPHERAL VASCULAR DISEASE (ICD-443.9) with intermittent claudication HYPERTENSION (ICD-401.9) CORONARY ARTERY DISEASE (ICD-414.00) stendting RCA 2005    a.  cath 12/13/2009:  LAD prox luminal irregularities; D1 ostial 25%; CFX mid 25%; RCA stent 30%; EF 65%  THROMBOCYTOPENIA (ICD-287.5) spinal stenosis PERSONAL HISTORY OF URINARY CALCULI (ICD-V13.01) UNSPECIFIED ANEMIA (ICD-285.9) HYPERLIPIDEMIA (ICD-272.4) HEAD TRAUMA, CLOSED (ICD-959.01) POST-TRAUMATIC HEADACHE UNSPECIFIED (ICD-339.20) ? of SHINGLES, RECURRENT (ICD-053.9) DERMATITIS (ICD-692.9) UNSPECIFIED VITAMIN D DEFICIENCY (ICD-268.9) HYPOTHYROIDISM (ICD-244.9) COPD (ICD-496) SPINAL STENOSIS (ICD-724.00) POSTHERPETIC NEURALGIA (ICD-053.19) Hx of CARCINOMA, BASAL CELL (ICD-173.9) Chronic pain management foot and back per Dr. Ethelene Hal are probable degenerative joint disease Vitreous detachment Nephrolithiasis, hx of  Past Surgical History: Reviewed history from 12/12/2009 and no  changes required. Lumbar laminectomy Cataract extraction Hysterectomy total  Lumpectomy Tonsillectomy PTCA/stent - bilat iliac stents, left renal artery, and right coronary artery, last myoview 8/09 wtih EF 70% skin cancer surgery Breast ca right lumpectomy?  1998 radiation  Past History:  Care Management: Atha Starks [facility]    [facility]  Family History: Reviewed history from 01/22/2009 and no  changes required. Family History Breast cancer 1st degree relative <50daughter deceased in 1997/06/30     Social History: Reviewed history from 11/17/2009 and no changes required. Current Smoker  stopped in Nov 08  some relapses Alcohol use-yessocial widowed 5 hours of sleep     Has friends checking on her     no pets   has no running water in her kitchen turned off for renovation after a water leak. This has been going on since November.Jun 30, 2009  Review of Systems       The patient complains of weight loss and prolonged cough.  The patient denies anorexia, fever, chest pain, syncope, abdominal pain, melena, hematochezia, severe indigestion/heartburn, transient blindness, depression, and abnormal bleeding.          Ultram medication makes her  legs feel wobbly but no specific weakness.  Physical Exam  General:  patient is a thin and somewhat cachectic in appearance. alert with obvious pain when she moves. She is ambulatory without assistance. Head:  normocephalic and atraumatic.   Eyes:  vision grossly intact.   Neck:  No deformities, masses, or tenderness noted. Lungs:  normal respiratory effort, no intercostal retractions, and no accessory muscle use.   Heart:  normal rate and regular rhythm.   Abdomen:  Bowel sounds positive,abdomen soft and non-tender without masses, organomegaly or   noted. Msk:  no joint swelling and no joint warmth.   good range of motion of the hip when laying some tenderness in groin. left Pulses:   left pedal DP decreased difficult to find right normal. There is a left femoral bruit no change.   Temperature of toes appeared equal both are cool but no discoloration. of note she took her socks off during my exam. She did complain that her feet get cold a lot. Extremities:   no edema Neurologic:  alert & oriented X3.   antalgic gait nonfocal Skin:  no ecchymoses and no petechiae.   senile changes Psych:  Oriented X3, memory intact for recent and remote, normally  interactive, good eye contact, not anxious appearing, and not depressed appearing.     Impression & Recommendations:  Problem # 1:  HIP PAIN (ICD-719.45)  with leg pain and back pain unclear etiology.  she definitely has a decreased pulse in her left foot but today no coolness or color change. if not for that would expect her pain to be orthopedic  or from neuritis... her last vascular study was in December of 2011. For now we will treat with pain medication and cautioned to prevent fall. We will get a vascular opinion first. Then consider further workup. Her updated medication list for this problem includes:    Adult Aspirin Low Strength 81 Mg Tbdp (Aspirin) .Marland Kitchen... 1 once daily    Tramadol Hcl 50 Mg Tabs (Tramadol hcl) .Marland Kitchen... 2 tabs q 6 hours as needed  Problem # 2:  PERIPHERAL VASCULAR DISEASE (ICD-443.9)  history of iliac stent last vascular study December 2011 apparently showed good flow. is unclear whether she has baseline claudication.  Problem # 3:  NEPHROLITHIASIS, HX OF (ICD-V13.01)  Problem #  4:  CORONARY ARTERY DISEASE (ICD-414.00)  Her updated medication list for this problem includes:    Imdur 60 Mg Xr24h-tab (Isosorbide mononitrate) .Marland Kitchen... Take 1 tablet by mouth once a day    Atenolol 50 Mg Tabs (Atenolol) .Marland Kitchen... 1/2 by mouth daily    Norvasc 10 Mg Tabs (Amlodipine besylate) .Marland Kitchen... Take 1 tablet once a day    Adult Aspirin Low Strength 81 Mg Tbdp (Aspirin) .Marland Kitchen... 1 once daily  Problem # 5:  TOBACCO USE (ICD-305.1)  Problem # 6:  COPD (ICD-496) Assessment: Comment Only  Complete Medication List: 1)  Imdur 60 Mg Xr24h-tab (Isosorbide mononitrate) .... Take 1 tablet by mouth once a day 2)  Atenolol 50 Mg Tabs (Atenolol) .... 1/2 by mouth daily 3)  Norvasc 10 Mg Tabs (Amlodipine besylate) .... Take 1 tablet once a day 4)  Adult Aspirin Low Strength 81 Mg Tbdp (Aspirin) .Marland Kitchen.. 1 once daily 5)  Vitamin D 1000 Unit Caps (Cholecalciferol) .Marland Kitchen.. 1 cap once daily 6)  Tramadol Hcl 50 Mg  Tabs (Tramadol hcl) .... 2 tabs q 6 hours as needed 7)  Voltaren 1 % Gel (Diclofenac sodium) .... 2 grams to upper extremities up to qid for arthritis pain  maximum 8 grams per day per joint. 8)  Fish Oil 1000 Mg Caps (Omega-3 fatty acids) .Marland Kitchen.. 1 tab by mouth once daily  Patient Instructions: 1)  can take the ultram    as needed with care.  2)  Will get Dr Excell Seltzer or you vascular doc to see ou nextweek and decide on need for another vascular study  on the left . 3)  If t vascular cause of pain ( prob not)   Then consider  ortho causes or pain. 4)  if gets worse over weekend   with left pain  seek emergent  care.  Prescriptions: TRAMADOL HCL 50 MG TABS (TRAMADOL HCL) 2 tabs q 6 hours as needed  #30 x 0   Entered by:   Romualdo Bolk, CMA (AAMA)   Authorized by:   Madelin Headings MD   Signed by:   Romualdo Bolk, CMA (AAMA) on 02/27/2010   Method used:   Print then Give to Patient   RxID:   [id number] TRAMADOL HCL 50 MG TABS (TRAMADOL HCL) 1/2 tab as needed three times a day  #30 x 0   Entered and Authorized by:   Madelin Headings MD   Signed by:   Madelin Headings MD on 02/27/2010   Method used:   Print then Give to Patient   RxID:   [id number]    Orders Added: 1)  Est. Patient Level IV [84696]    Laboratory Results   Urine Tests    Routine Urinalysis   Color: yellow Appearance: Clear Glucose: negative   (Normal Range: Negative) Bilirubin: negative   (Normal Range: Negative) Ketone: negative   (Normal Range: Negative) Spec. Gravity: 1.010   (Normal Range: 1.003-1.035) Blood: large   (Normal Range: Negative) pH: 6.0   (Normal Range: 5.0-8.0) Protein: negative   (Normal Range: Negative) Urobilinogen: 0.2   (Normal Range: 0-1) Nitrite: negative   (Normal Range: Negative) Leukocyte Esterace: negative   (Normal Range: Negative)       Pt has an appt on Monday at 2:30pm with Dr. Excell Seltzer. Pt aware of this. Romualdo Bolk, CMA (AAMA)  February 27, 2010  11:10 AM   patient states she has blood in her urine usually because she has a history of kidney  stones.  wkp.

## 2010-03-05 NOTE — Assessment & Plan Note (Signed)
Summary: rov   Primary Provider:  Fabian Sharp  CC:  pressure in chest .  History of Present Illness: [name] [name] is a pleasant female who has a history of coronary artery disease, status post stenting of the right coronary artery in December 2005.  Her most recent Myoview was performed on September 15, 2007.  At that time, she had an ejection fraction 70%.  There was no ischemia or infarction.  She also has a history of cerebrovascular disease.  Her most recent carotid Dopplers were performed in August 2010.  There was a 40-59% bilateral stenosis.  Followup was recommended in 1 year. She also has a history of renal artery stenosis.  Last renal Dopplers were in August 2010.  Renal arteries showed no stenosis. There was a right renal mass.  However dedicated ultrasound showed a renal cyst. She also has peripheral vascular disease and has seen Dr. Excell Seltzer. An arteriogram revealed occlusion of the left SFA. It was unfavorable for intervention and medical therapy was recommended if possible. ABIs in August of 2011 was 0.6 on the left and 1.0 on the right. I last saw her in April of 2011. The patient called the office today complaining of chest pain and presents for evaluation. She states that for the past 3-4 days she has had intermittent chest pressure. It does not radiate. It is not pleuritic or positional and is not related to food. There is no associated symptoms. It can occur with exertion but also occurs with rest and is occurring in the office at the time of evaluation. She states it is identical to the pain she had prior to her previous PCI. Note her symptoms are somewhat vague.   Current Medications (verified): 1)  Imdur 30 Mg Tb24 (Isosorbide Mononitrate) .Marland Kitchen.. 1 By Mouth Once Daily 2)  Atenolol 50 Mg Tabs (Atenolol) .Marland Kitchen.. 1 By Mouth Once Daily 3)  Norvasc 10 Mg Tabs (Amlodipine Besylate) .... Take 1 Tablet Once A Day 4)  Adult Aspirin Low Strength 81 Mg  Tbdp (Aspirin) .Marland Kitchen.. 1 Once Daily 5)  Vitamin D  1000 Unit Caps (Cholecalciferol) .Marland Kitchen.. 1 Cap Once Daily 6)  Tramadol Hcl 50 Mg Tabs (Tramadol Hcl) .... 1/2 Tab As Needed 7)  Voltaren 1 % Gel (Diclofenac Sodium) .... 2 Grams To Upper Extremities Up To Qid For Arthritis Pain  Maximum 8 Grams Per Day Per Joint. 8)  Fish Oil 1000 Mg Caps (Omega-3 Fatty Acids) .Marland Kitchen.. 1 Tab By Mouth Once Daily  Allergies: 1)  * Tequin (Gatifloxacin) 2)  * Oxycodone 3)  Verapamil Hcl Cr (Verapamil Hcl)  Past History:  Past Medical History: CAROTID ARTERY DISEASE (ICD-433.10)last cath 11/05, last myoview neg 8/09   PERIPHERAL VASCULAR DISEASE (ICD-443.9) with intermittent claudication HYPERTENSION (ICD-401.9) CORONARY ARTERY DISEASE (ICD-414.00) stendting RCA 2005  THROMBOCYTOPENIA (ICD-287.5) spinal stenosis PERSONAL HISTORY OF URINARY CALCULI (ICD-V13.01) UNSPECIFIED ANEMIA (ICD-285.9) HYPERLIPIDEMIA (ICD-272.4) HEAD TRAUMA, CLOSED (ICD-959.01) POST-TRAUMATIC HEADACHE UNSPECIFIED (ICD-339.20) ? of SHINGLES, RECURRENT (ICD-053.9) DERMATITIS (ICD-692.9) UNSPECIFIED VITAMIN D DEFICIENCY (ICD-268.9) HYPOTHYROIDISM (ICD-244.9) COPD (ICD-496) SPINAL STENOSIS (ICD-724.00) POSTHERPETIC NEURALGIA (ICD-053.19) Hx of CARCINOMA, BASAL CELL (ICD-173.9) Chronic pain management foot and back per Dr. Ethelene Hal are probable degenerative joint disease Vitreous detachment  Past Surgical History: Lumbar laminectomy Cataract extraction Hysterectomy total  Lumpectomy Tonsillectomy PTCA/stent - bilat iliac stents, left renal artery, and right coronary artery, last myoview 8/09 wtih EF 70% skin cancer surgery Breast ca right lumpectomy?  1998 radiation  Social History: Reviewed history from 11/17/2009 and no changes required. Current Smoker  stopped in Nov 08  some relapses Alcohol use-yessocial widowed 5 hours of sleep     Has friends checking on her     no pets   Review of Systems       no fevers or chills, productive cough, hemoptysis, dysphasia,  odynophagia, melena, hematochezia, dysuria, hematuria, rash, seizure activity, orthopnea, PND, pedal edema, claudication. Remaining systems are negative.   Vital Signs:  Patient profile:   77 year old female Menstrual status:  hysterectomy Height:      63 inches Weight:      100 pounds BMI:     17.78 Pulse rate:   59 / minute Resp:     18 per minute BP sitting:   130 / 80  (left arm)  Vitals Entered By: Kem Parkinson (December 12, 2009 11:46 AM)  Physical Exam  General:  Well developed/well nourished in NAD Skin warm/dry Patient not depressed No peripheral clubbing Back-normal HEENT-normal/normal eyelids Neck supple/normal carotid upstroke bilaterally; right bruit; no JVD; no thyromegaly chest - CTA/ normal expansion CV - RRR/normal S1 and S2; no  rubs or gallops;  PMI nondisplaced; 2/6 systolic murmur at the apex. Abdomen -NT/ND, no HSM, no mass, + bowel sounds, no bruit 2+ femoral pulses, no bruits Ext-no edema, chords, 2+ DP on the right and 1+ on the left Neuro-grossly nonfocal      EKG  Procedure date:  12/12/2009  Findings:      Sinus rhythm, PACs, septal infarct, nonspecific ST changes.  Impression & Recommendations:  Problem # 1:  UNSTABLE ANGINA (ICD-411.1) Patient's symptoms are somewhat vague but are new in onset and are occurring at rest in the office. She also states they are identical to her symptoms prior to her last PCI. We will admit and rule out myocardial infarction with serial enzymes. Proceed with cardiac catheterization. The risks and benefits including but not limited to myocardial infarction, CVA and death were discussed and the patient agrees to proceed. Continue aspirin and beta blocker. She is not on a statin secondary to increased liver functions in the past. Her updated medication list for this problem includes:    Imdur 30 Mg Tb24 (Isosorbide mononitrate) .Marland Kitchen... 1 by mouth once daily    Atenolol 50 Mg Tabs (Atenolol) .Marland Kitchen... 1 by mouth  once daily    Norvasc 10 Mg Tabs (Amlodipine besylate) .Marland Kitchen... Take 1 tablet once a day    Adult Aspirin Low Strength 81 Mg Tbdp (Aspirin) .Marland Kitchen... 1 once daily  Problem # 2:  ESSENTIAL HYPERTENSION, BENIGN (ICD-401.1) Blood pressure controlled. Continue present medications. Her updated medication list for this problem includes:    Atenolol 50 Mg Tabs (Atenolol) .Marland Kitchen... 1 by mouth once daily    Norvasc 10 Mg Tabs (Amlodipine besylate) .Marland Kitchen... Take 1 tablet once a day    Adult Aspirin Low Strength 81 Mg Tbdp (Aspirin) .Marland Kitchen... 1 once daily  Problem # 3:  LIVER FUNCTION TESTS, ABNORMAL (ICD-794.8) Followup gastroenterology.  Problem # 4:  RENAL ARTERY STENOSIS (ICD-440.1) Continue aspirin.  Problem # 5:  CAROTID ARTERY DISEASE (ICD-433.10) Continue aspirin. Followup carotid Dopplers after discharge. Her updated medication list for this problem includes:    Adult Aspirin Low Strength 81 Mg Tbdp (Aspirin) .Marland Kitchen... 1 once daily  Problem # 6:  PERIPHERAL VASCULAR DISEASE (ICD-443.9) Continue aspirin.  Problem # 7:  CORONARY ARTERY DISEASE (ICD-414.00) Continue aspirin, beta blocker and nitrates. Her updated medication list for this problem includes:    Imdur 30 Mg Tb24 (Isosorbide mononitrate) .Marland KitchenMarland KitchenMarland KitchenMarland Kitchen 1  by mouth once daily    Atenolol 50 Mg Tabs (Atenolol) .Marland Kitchen... 1 by mouth once daily    Norvasc 10 Mg Tabs (Amlodipine besylate) .Marland Kitchen... Take 1 tablet once a day    Adult Aspirin Low Strength 81 Mg Tbdp (Aspirin) .Marland Kitchen... 1 once daily  Problem # 8:  HYPERLIPIDEMIA (ICD-272.4) Will consider resuming statin if LFTs have normalized.  Problem # 9:  TOBACCO USE (ICD-305.1) Patient counseled on discontinuing.

## 2010-03-05 NOTE — Letter (Signed)
Summary: [facility]  [facility]   Imported By: Maryln Gottron 12/17/2009 12:27:38  _____________________________________________________________________  External Attachment:    Type:   Image     Comment:   External Document

## 2010-03-05 NOTE — Letter (Signed)
Summary: [facility]  [facility]   Imported By: Maryln Gottron 06/04/2009 10:39:27  _____________________________________________________________________  External Attachment:    Type:   Image     Comment:   External Document

## 2010-03-05 NOTE — Assessment & Plan Note (Signed)
Summary: 9 mo F/U   Primary Provider:  [name]  CC:  no complaints.  History of Present Illness: Ms. Elgersma is a pleasant female who has a history of coronary artery disease, status post stenting of the right coronary artery in December 2005.  Her most recent Myoview was performed on September 15, 2007.  At that time, she had an ejection fraction 70%.  There was no ischemia or infarction.  She also has a history of cerebrovascular disease.  Her most recent carotid Dopplers were performed in August 2010.  There was a 40-59% bilateral stenosis.  Followup was recommended in 1 year. She also has a history of renal artery stenosis.  Last renal Dopplers were in August 2010.  Renal arteries showed no stenosis. There was a right renal mass.  However dedicated ultrasound showed a renal cyst. She also has peripheral vascular disease and has seen Dr. Excell Seltzer. An arteriogram revealed occlusion of the left SFA. It was unfavorable for intervention and medical therapy was recommended if possible. I last saw her in July of 2010. Since then she denies dyspnea on exertion, orthopnea, PND, palpitations or chest pain. She has occasional mild edema predominant in the right ankle. Note her liver functions were recently increased possibly secondary to Crestor.  Current Medications (verified): 1)  Imdur 30 Mg Tb24 (Isosorbide Mononitrate) .Marland Kitchen.. 1 By Mouth Once Daily 2)  Atenolol 50 Mg Tabs (Atenolol) .Marland Kitchen.. 1 By Mouth Once Daily 3)  Norvasc 10 Mg Tabs (Amlodipine Besylate) .... Take 1 Tablet Once A Day 4)  Adult Aspirin Low Strength 81 Mg  Tbdp (Aspirin) .Marland Kitchen.. 1 Once Daily 5)  Vitamin D 1000 Unit Caps (Cholecalciferol) .Marland Kitchen.. 1 Cap Once Daily 6)  Tramadol Hcl 50 Mg Tabs (Tramadol Hcl) .... 1/2 Tab As Needed 7)  Voltaren 1 % Gel (Diclofenac Sodium) .... 2 Grams To Upper Extremities Up To Qid For Arthritis Pain  Maximum 8 Grams Per Day Per Joint.  Allergies: 1)  * Tequin (Gatifloxacin) 2)  * Oxycodone 3)  Verapamil Hcl Cr  (Verapamil Hcl)  Past History:  Past Medical History: CAROTID ARTERY DISEASE (ICD-433.10)last cath 11/05, last myoview neg 8/09   PERIPHERAL VASCULAR DISEASE (ICD-443.9) HYPERTENSION (ICD-401.9) CORONARY ARTERY DISEASE (ICD-414.00) THROMBOCYTOPENIA (ICD-287.5) BACK PAIN (ICD-7  spinal stenosis PERSONAL HISTORY OF URINARY CALCULI (ICD-V13.01) UNSPECIFIED ANEMIA (ICD-285.9) HYPERLIPIDEMIA (ICD-272.4) HEAD TRAUMA, CLOSED (ICD-959.01) POST-TRAUMATIC HEADACHE UNSPECIFIED (ICD-339.20) ? of SHINGLES, RECURRENT (ICD-053.9) DERMATITIS (ICD-692.9) UNSPECIFIED VITAMIN D DEFICIENCY (ICD-268.9) HYPOTHYROIDISM (ICD-244.9) COPD (ICD-496) SPINAL STENOSIS (ICD-724.00) DEGENERATIVE JOINT DISEASE (ICD-715.90) POSTHERPETIC NEURALGIA (ICD-053.19) Hx of CARCINOMA, BASAL CELL (ICD-173.9) Chronic pain management foot and back per Dr. Ethelene Hal are probable degenerative joint disease  Past Surgical History: Lumbar laminectomy Cataract extraction Hysterectomy total  Lumpectomy Tonsillectomy PTCA/stent - bilat iliac stents, left renal artery, and right coronary artery, last myoview 8/09 wtih EF 70% carotid stenosis - 50% bilat stenosis 8/09 left renal artery with mild prox stenosis 03/2008 right renal artery with diffuse mild restenosis - 03/2008 skin cancer surgery Breast ca right lumpectomy?  1998 radiation  Social History: Reviewed history from 01/22/2009 and no changes required. Current Smoker  stopped in Nov 08  some relapses Alcohol use-yessocial widowed 5 hours of sleep     Has friends checking on her      Review of Systems       Complains of floaters, mild edema in the lower extremities and some arthralgias but no fevers or chills, productive cough, hemoptysis, dysphasia, odynophagia, melena, hematochezia, dysuria, hematuria, rash, seizure activity, orthopnea,  PND. Remaining systems are negative.   Vital Signs:  Patient profile:   77 year old female Menstrual status:   hysterectomy Height:      64 inches Weight:      101 pounds BMI:     17.40 Pulse rate:   63 / minute Resp:     18 per minute BP sitting:   151 / 59  (left arm)  Vitals Entered By: Kem Parkinson (May 27, 2009 11:14 AM)  Physical Exam  General:  Well-developed frail in no acute distress.  Skin is warm and dry.  HEENT is normal.  Neck is supple. No thyromegaly.  Chest is clear to auscultation with normal expansion.  Cardiovascular exam is regular rate and rhythm.  Abdominal exam nontender or distended. No masses palpated. Extremities show no edema. neuro grossly intact    EKG  Procedure date:  05/27/2009  Findings:      Sinus rhythm at a rate of 56. Cannot rule out prior septal infarct. Nonspecific ST changes.  Impression & Recommendations:  Problem # 1:  LIVER FUNCTION TESTS, ABNORMAL (ICD-794.8) Felt secondary to Crestor. She is being followed by GI. We will consider a different statin in the future if her values improved off of Crestor.  Problem # 2:  RENAL ARTERY STENOSIS (ICD-440.1) Continue aspirin. No renal artery stenosis on recent Dopplers.  Problem # 3:  CAROTID ARTERY DISEASE (ICD-433.10)  Continue aspirin. Followup carotid Dopplers August of 2012. Her updated medication list for this problem includes:    Adult Aspirin Low Strength 81 Mg Tbdp (Aspirin) .Marland Kitchen... 1 once daily  Her updated medication list for this problem includes:    Adult Aspirin Low Strength 81 Mg Tbdp (Aspirin) .Marland Kitchen... 1 once daily  Problem # 4:  PERIPHERAL VASCULAR DISEASE (ICD-443.9) Continue aspirin.  Problem # 5:  HYPERTENSION (ICD-401.9) Blood pressure elevated. Add HCTZ 12.5 mg p.o. daily. Check be met in one week. Her updated medication list for this problem includes:    Atenolol 50 Mg Tabs (Atenolol) .Marland Kitchen... 1 by mouth once daily    Norvasc 10 Mg Tabs (Amlodipine besylate) .Marland Kitchen... Take 1 tablet once a day    Adult Aspirin Low Strength 81 Mg Tbdp (Aspirin) .Marland Kitchen... 1 once daily     Hydrochlorothiazide 12.5 Mg Tabs (Hydrochlorothiazide) .Marland Kitchen... Take one tablet by mouth daily.  Problem # 6:  CORONARY ARTERY DISEASE (ICD-414.00) Continue aspirin and beta blocker. Her updated medication list for this problem includes:    Imdur 30 Mg Tb24 (Isosorbide mononitrate) .Marland Kitchen... 1 by mouth once daily    Atenolol 50 Mg Tabs (Atenolol) .Marland Kitchen... 1 by mouth once daily    Norvasc 10 Mg Tabs (Amlodipine besylate) .Marland Kitchen... Take 1 tablet once a day    Adult Aspirin Low Strength 81 Mg Tbdp (Aspirin) .Marland Kitchen... 1 once daily  Orders: EKG w/ Interpretation (93000)  Problem # 7:  HYPERLIPIDEMIA (ICD-272.4) As per above will consider a different statin if liver functions improve off of Crestor. The following medications were removed from the medication list:    Crestor 10 Mg Tabs (Rosuvastatin calcium) .Marland Kitchen... 1 by mouth once daily-holding  Problem # 8:  TOBACCO USE (ICD-305.1) Patient counseled on discontinuing.  Problem # 9:  COPD (ICD-496)  Patient Instructions: 1)  Your physician recommends that you schedule a follow-up appointment in: 9 months 2)  Your physician recommends that you return for lab work in: One week: BMET. New medication HCTZ 12.5 mg once a day Prescriptions: HYDROCHLOROTHIAZIDE 12.5 MG TABS (HYDROCHLOROTHIAZIDE) Take one  tablet by mouth daily.  #30 x 6   Entered by:   Ollen Gross, RN, BSN   Authorized by:   Ferman Hamming, MD, [facility]   Signed by:   Ollen Gross, RN, BSN on 05/27/2009   Method used:   Electronically to        [facility]       [address]       [place], [place]  [address]       Ph: [phone]       Fax: [phone]   RxID:   [id number]

## 2010-03-05 NOTE — Letter (Signed)
Summary: ER Notification  Architectural technologist, Main Office  [address]   [place], [place] [address]   Phone: [phone]  Fax: [phone]    December 12, 2009 12:48 PM  Lyndee Leo  The above referenced patient has been advised to report directly to the Emergency Room. Please see below for more information:  Dx: ANGINA   Private Vehicle  _______________ or EMS:  __XX______________   Orders:  Yes ___XX___ or No  _______ WITH PT   Notify upon arrival:     Trish [phone]       Or _________________   Thank you,    [facility] Staff

## 2010-03-05 NOTE — Letter (Signed)
Summary: [facility]  [facility]   Imported By: Maryln Gottron 03/12/2009 14:00:44  _____________________________________________________________________  External Attachment:    Type:   Image     Comment:   External Document

## 2010-03-05 NOTE — Letter (Signed)
Summary: [facility]  [facility]   Imported By: Maryln Gottron 01/01/2010 11:27:57  _____________________________________________________________________  External Attachment:    Type:   Image     Comment:   External Document

## 2010-03-05 NOTE — Progress Notes (Signed)
Summary: referral  Phone Note Call from Patient   Caller: Patient Call For: Madelin Headings MD Summary of Call: Pt called to have an explanation of her discharge notes, and wants to be sure the Podiatry referral is going to be done. Initial call taken by: Lynann Beaver CMA,  October 14, 2009 2:28 PM  Follow-up for Phone Call        Pt aware that we are working on the referral and we will give her a call. I also told her the other stuff on her discharge instructions were labs that Dr. Fabian Sharp wants her to have done so she needs to keep the appts and quit smoking. Follow-up by: Romualdo Bolk, CMA Duncan Dull),  October 14, 2009 2:49 PM

## 2010-03-05 NOTE — Progress Notes (Signed)
  Phone Note Outgoing Call   Call placed by: Deliah Goody, RN,  Jun 10, 2009 4:15 PM Summary of Call: called pt with bmp results, she wanted to let dr Jens Som know the urologist put her on vesicare. she is also on lasix and wonders if she should take both. will foward to dr Jens Som for his review Deliah Goody, RN  Jun 10, 2009 4:16 PM   Follow-up for Phone Call        ok Ferman Hamming, MD, [facility]  Jun 10, 2009 4:57 PM  pt aware Deliah Goody, RN  Jun 11, 2009 1:11 PM

## 2010-03-05 NOTE — Progress Notes (Signed)
Summary: Pt needs to get another mammogram done in October  Phone Note Call from Patient Call back at [facility] Phone [phone]   Caller: Patient Summary of Call: Pt called and is needing to get another mammogram done, because previous test came back abnormal.  Pt was instructed by her insurance that pt should call Dr. Fabian Sharp and have her sch the mammogram, to ensure that it is covered by insurance. She needs to have this done in October 2011.    Initial call taken by: Lucy Antigua,  August 08, 2009 2:29 PM  Follow-up for Phone Call        please do this as recommended by radiology  Follow-up by: Madelin Headings MD,  August 08, 2009 5:11 PM  Additional Follow-up for Phone Call Additional follow up Details #1::        Pt aware that she needs to call [facility] to have them set this up. Then if they need [place] to do anything, we will be more than happy to do it. Additional Follow-up by: Romualdo Bolk, CMA (AAMA),  August 11, 2009 2:29 PM

## 2010-03-05 NOTE — Assessment & Plan Note (Signed)
Summary: eph/chest pain/mj   Primary Provider:  [name]   History of Present Illness: Primary Cardiologist:  Dr. Olga Millers  [name] [name] is a 76 yo female who has a history of CAD, s/p stenting of the right coronary artery in 01/2004.  Her most recent Myoview was performed on September 15, 2007 and demonstrated an ejection fraction 70% with no ischemia or infarction.  She also has a history of cerebrovascular disease.  Her most recent carotid Dopplers done in August 2010 demonstrated 40-59% bilateral stenosis.  Followup was recommended in 1 year. She also has a history of renal artery stenosis.  Last renal Dopplers were in August 2010 and showed no stenosis. There was a right renal mass.  However dedicated ultrasound showed a renal cyst. She also has PAD and has seen Dr. Excell Seltzer. An arteriogram revealed occlusion of the left SFA which was unfavorable for intervention and medical therapy was recommended if possible. ABIs in August of 2011 was 0.6 on the left and 1.0 on the right.  She presented recently with chest pain and was admitted to [facility].  Her cardiac cath demonstrated a patent RCA stent and nonobstructive disease elsewhere with a normal EF.  Medical therapy was continued.   Of note, her Atenolol dose was decreased due to bradycardia.  She returns for follow up.  She denies any further chest pain.  She believes her symptoms were from Symbicort.  She has not had any chest pain since stopping this.  She denies significant dyspnea.  No orthopnea, PND or edema.  She denies syncope or lightheadedness.  Current Medications (verified): 1)  Imdur 30 Mg Tb24 (Isosorbide Mononitrate) .Marland Kitchen.. 1 By Mouth Once Daily 2)  Atenolol 50 Mg Tabs (Atenolol) .... 1/2 By Mouth Daily 3)  Norvasc 10 Mg Tabs (Amlodipine Besylate) .... Take 1 Tablet Once A Day 4)  Adult Aspirin Low Strength 81 Mg  Tbdp (Aspirin) .Marland Kitchen.. 1 Once Daily 5)  Vitamin D 1000 Unit Caps (Cholecalciferol) .Marland Kitchen.. 1 Cap Once Daily 6)  Tramadol Hcl  50 Mg Tabs (Tramadol Hcl) .... 1/2 Tab As Needed 7)  Voltaren 1 % Gel (Diclofenac Sodium) .... 2 Grams To Upper Extremities Up To Qid For Arthritis Pain  Maximum 8 Grams Per Day Per Joint. 8)  Fish Oil 1000 Mg Caps (Omega-3 Fatty Acids) .Marland Kitchen.. 1 Tab By Mouth Once Daily  Allergies (verified): 1)  * Tequin (Gatifloxacin) 2)  * Oxycodone 3)  Verapamil Hcl Cr (Verapamil Hcl)  Past History:  Past Medical History: CAROTID ARTERY DISEASE (ICD-433.10)last cath 11/05, last myoview neg 8/09   PERIPHERAL VASCULAR DISEASE (ICD-443.9) with intermittent claudication HYPERTENSION (ICD-401.9) CORONARY ARTERY DISEASE (ICD-414.00) stendting RCA 2005    a.  cath 12/13/2009:  LAD prox luminal irregularities; D1 ostial 25%; CFX mid 25%; RCA stent 30%; EF 65%  THROMBOCYTOPENIA (ICD-287.5) spinal stenosis PERSONAL HISTORY OF URINARY CALCULI (ICD-V13.01) UNSPECIFIED ANEMIA (ICD-285.9) HYPERLIPIDEMIA (ICD-272.4) HEAD TRAUMA, CLOSED (ICD-959.01) POST-TRAUMATIC HEADACHE UNSPECIFIED (ICD-339.20) ? of SHINGLES, RECURRENT (ICD-053.9) DERMATITIS (ICD-692.9) UNSPECIFIED VITAMIN D DEFICIENCY (ICD-268.9) HYPOTHYROIDISM (ICD-244.9) COPD (ICD-496) SPINAL STENOSIS (ICD-724.00) POSTHERPETIC NEURALGIA (ICD-053.19) Hx of CARCINOMA, BASAL CELL (ICD-173.9) Chronic pain management foot and back per Dr. Ethelene Hal are probable degenerative joint disease Vitreous detachment  Vital Signs:  Patient profile:   77 year old female Menstrual status:  hysterectomy Height:      63 inches Weight:      101 pounds BMI:     17.96 Pulse rate:   60 / minute Resp:     16  per minute BP sitting:   143 / 63  (right arm)  Vitals Entered By: Marrion Coy, CNA (January 02, 2010 1:42 PM)  Physical Exam  General:  Well nourished, well developed, in no acute distress HEENT: normal Neck: no JVD Cardiac:  normal S1, S2; RRR; no murmur Lungs:  decreased breath sounds bilat.; basilar rhonchi; no rales Abd: soft, nontender, no  hepatomegaly Ext: no edema; R FA site without hematoma; + bruit noted Skin: warm and dry Neuro:  CNs 2-12 intact, no focal abnormalities noted    Impression & Recommendations:  Problem # 1:  CORONARY ARTERY DISEASE (ICD-414.00)  As noted, cath with nonobs CAD. Medical therapy continued. Continue ASA and nitrates.  Problem # 2:  ESSENTIAL HYPERTENSION, BENIGN (ICD-401.1)  HR low in hosp.  BP now up after atenolol decreased. Uptitrate Imdur to 60 mg to see if she can get to goal with that. Suspect she is not on ACE or ARB due to h/o RAS. She stopped HCTZ due to frequent urination.  Problem # 3:  HYPERLIPIDEMIA (ICD-272.4) Not on statin.  LFTs have been elevated in the past. LFTs were still somewhat elevated in the hospital. Hold off on reinitiating statin for now.  Problem # 4:  FEMORAL BRUIT, RIGHT (ICD-785.9) Post cath.  Site looks stable.  But, pre-cath PE notes no bruits. Will get u/s to r/o pseudoaneurysm.  Orders: Arterial Duplex Lower Extremity (Arterial Duplex Low)  Problem # 5:  LIVER FUNCTION TESTS, ABNORMAL (ICD-794.8) Patient states she is seeing GI. She states it is uncertain what is causing her elevation in LFTs.  Problem # 6:  CAROTID ARTERY DISEASE (ICD-433.10)  She was due for dopplers in Aug. 2011.  Will set up surveillance.  Orders: Carotid Duplex (Carotid Duplex)  Problem # 7:  THROMBOCYTOPENIA (ICD-287.5) Chronic.  Problem # 8:  TOBACCO USE (ICD-305.1) She is trying to quit.  Patient Instructions: 1)  Your physician recommends that you schedule a follow-up appointment in: 3 months with Dr Jens Som 2)  Your physician recommends that you continue on your current medications as directed. Please refer to the Current Medication list given to you today. 3)  Your physician has requested that you have a carotid duplex. This test is an ultrasound of the carotid arteries in your neck. It looks at blood flow through these arteries that supply the brain  with blood. Allow one hour for this exam. There are no restrictions or special instructions. 4)  Your physician has requested that you have a lower or upper extremity arterial duplex.  This test is an ultrasound of the arteries in the legs or arms.  It looks at arterial blood flow in the legs and arms.  Allow one hour for Lower and Upper Arterial scans. There are no restrictions or special instructions. Prescriptions: ATENOLOL 50 MG TABS (ATENOLOL) 1/2 by mouth daily  #45 x 1   Entered and Authorized by:   Tereso Newcomer PA-C   Signed by:   Tereso Newcomer PA-C on 01/02/2010   Method used:   Electronically to        [facility]       [address]       [place], [place]  [address]       Ph: [phone]       Fax: [phone]   RxID:   [id number] NORVASC 10 MG TABS (AMLODIPINE BESYLATE) Take 1 tablet once a day  #90 Tablet  x 1   Entered and Authorized by:   Tereso Newcomer PA-C   Signed by:   Tereso Newcomer PA-C on 01/02/2010   Method used:   Electronically to        [facility]       [address]       [place], [place]  [address]       Ph: [phone]       Fax: [phone]   RxID:   [id number] IMDUR 60 MG XR24H-TAB (ISOSORBIDE MONONITRATE) Take 1 tablet by mouth once a day  #90 x 1   Entered and Authorized by:   Tereso Newcomer PA-C   Signed by:   Tereso Newcomer PA-C on 01/02/2010   Method used:   Electronically to        [facility]       [address]       [place], [place]  [address]       Ph: [phone]       Fax: [phone]   RxID:   [id number]

## 2010-03-05 NOTE — Assessment & Plan Note (Signed)
Summary: CONGESTION/RCD   Vital Signs:  Patient profile:   77 year old female Menstrual status:  hysterectomy Height:      64 inches (162.56 cm) Weight:      101 pounds (45.91 kg) O2 Sat:      96 % on Room air Temp:     98.2 degrees F (36.78 degrees C) oral Pulse rate:   66 / minute BP sitting:   130 / 70  (left arm)  Vitals Entered By: Josph Macho RMA (November 01, 2009 10:12 AM)  O2 Flow:  Room air CC: Congestion, nose running, sneezing, coughing w/phlegm (sometimes clear and sometimes yellow), throat hurts X2 days/  CF Is Patient Diabetic? No   History of Present Illness: Patient in tod with symptoms which started yesterday and kept her up all night. Yesterday she started with sneezing and nasal congestion. He progressed to nasal congestion became more copious and yellow, she developed a sore throat and a cough. The cough is generally nonproductive but did keep her up at night. She developed some chest pain now whenever she coughs, did not sleep well last night so she  is excessively tired,  has a generalized headache, sinus pressure and, ear pressure b/l. Her right ear is also slightly painful. She denies any palpitations no obvious wheezing, fevers, chills, GI or GU complaints.  Current Medications (verified): 1)  Imdur 30 Mg Tb24 (Isosorbide Mononitrate) .Marland Kitchen.. 1 By Mouth Once Daily 2)  Atenolol 50 Mg Tabs (Atenolol) .Marland Kitchen.. 1 By Mouth Once Daily 3)  Norvasc 10 Mg Tabs (Amlodipine Besylate) .... Take 1 Tablet Once A Day 4)  Adult Aspirin Low Strength 81 Mg  Tbdp (Aspirin) .Marland Kitchen.. 1 Once Daily 5)  Vitamin D 1000 Unit Caps (Cholecalciferol) .Marland Kitchen.. 1 Cap Once Daily 6)  Tramadol Hcl 50 Mg Tabs (Tramadol Hcl) .... 1/2 Tab As Needed 7)  Voltaren 1 % Gel (Diclofenac Sodium) .... 2 Grams To Upper Extremities Up To Qid For Arthritis Pain  Maximum 8 Grams Per Day Per Joint. 8)  Allergy Relief 4 Mg Tabs (Chlorpheniramine Maleate) .... As Needed 9)  Fish Oil 1000 Mg Caps (Omega-3 Fatty  Acids) 10)  Gelnique  Allergies (verified): 1)  * Tequin (Gatifloxacin) 2)  * Oxycodone 3)  Verapamil Hcl Cr (Verapamil Hcl)  Past History:  Past medical history reviewed for relevance to current acute and chronic problems. Social history (including risk factors) reviewed for relevance to current acute and chronic problems.  Past Medical History: Reviewed history from 10/13/2009 and no changes required. CAROTID ARTERY DISEASE (ICD-433.10)last cath 11/05, last myoview neg 8/09   PERIPHERAL VASCULAR DISEASE (ICD-443.9) with intermittent claudication HYPERTENSION (ICD-401.9) CORONARY ARTERY DISEASE (ICD-414.00) stendting RCA 2005  THROMBOCYTOPENIA (ICD-287.5) BACK PAIN (ICD-7  spinal stenosis PERSONAL HISTORY OF URINARY CALCULI (ICD-V13.01) UNSPECIFIED ANEMIA (ICD-285.9) HYPERLIPIDEMIA (ICD-272.4) HEAD TRAUMA, CLOSED (ICD-959.01) POST-TRAUMATIC HEADACHE UNSPECIFIED (ICD-339.20) ? of SHINGLES, RECURRENT (ICD-053.9) DERMATITIS (ICD-692.9) UNSPECIFIED VITAMIN D DEFICIENCY (ICD-268.9) HYPOTHYROIDISM (ICD-244.9) COPD (ICD-496) SPINAL STENOSIS (ICD-724.00) DEGENERATIVE JOINT DISEASE (ICD-715.90) POSTHERPETIC NEURALGIA (ICD-053.19) Hx of CARCINOMA, BASAL CELL (ICD-173.9) Chronic pain management foot and back per Dr. Ethelene Hal are probable degenerative joint disease Vitreous detachment  Social History: Reviewed history from 01/22/2009 and no changes required. Current Smoker  stopped in Nov 08  some relapses Alcohol use-yessocial widowed 5 hours of sleep     Has friends checking on her      Review of Systems      See HPI  Physical Exam  General:  Well-developed,well-nourished,in no acute distress;  alert,appropriate and cooperative throughout examination. Frail Head:  Normocephalic and atraumatic without obvious abnormalities. No apparent alopecia or balding. Ears:  Left TM dull and retracted, no erythema. R TM is erythematous and dull Nose:  External nasal examination shows no  deformity or inflammation. Nasal mucosa are pink and moist without lesions or exudates. Mouth:  Oral mucosa and oropharynx without lesions or exudates.  Teeth in good repair. pharyngeal erythema.   Neck:  No deformities, masses, or tenderness noted. Lungs:  R wheezes and L wheezes.  Is is expiratory and more notable at the bases. No rales or rhonchi Heart:  Normal rate and regular rhythm. S1 and S2 normal without gallop, murmur, click, rub or other extra sounds. Abdomen:  Bowel sounds positive,abdomen soft and non-tender without masses, organomegaly or hernias noted. Extremities:  No clubbing, cyanosis, edema, or deformity noted with normal full range of motion of all joints.   Cervical Nodes:  R anterior LN enlarged and L anterior LN enlarged.   Psych:  Cognition and judgment appear intact. Alert and cooperative with normal attention span and concentration. No apparent delusions, illusions, hallucinations   Impression & Recommendations:  Problem # 1:  BRONCHITIS, ACUTE (ICD-466.0)  Her updated medication list for this problem includes:    Augmentin 875-125 Mg Tabs (Amoxicillin-pot clavulanate) .Marland Kitchen... 1 tab by mouth two times a day x 10 days    Symbicort 160-4.5 Mcg/act Aero (Budesonide-formoterol fumarate) .Marland Kitchen... 1 puff by mouth two times a day    Tussionex Pennkinetic Er 10-8 Mg/91ml Lqcr (Hydrocod polst-chlorphen polst) .Marland Kitchen... 1 tsp by mouth two times a day as needed cough Push clear fluids and increase rest, report if symptoms do not improve. Early ROM also noted  Problem # 2:  ESSENTIAL HYPERTENSION, BENIGN (ICD-401.1)  Her updated medication list for this problem includes:    Atenolol 50 Mg Tabs (Atenolol) .Marland Kitchen... 1 by mouth once daily    Norvasc 10 Mg Tabs (Amlodipine besylate) .Marland Kitchen... Take 1 tablet once a day Adequate control despite current illness, no change in therapy  Problem # 3:  WEIGHT LOSS (ICD-783.21) Unchanged from last visit. encouraged increased by mouth intake and a yogurt  daily while on antibiotics  Problem # 4:  TOBACCO USE (ICD-305.1)  Continues to smoke daily to varying degrees encouraged complete cessation due to her myriad health concerns, patient noncommital  Orders: Tobacco use cessation intermediate 3-10 minutes ([address])  Complete Medication List: 1)  Imdur 30 Mg Tb24 (Isosorbide mononitrate) .Marland Kitchen.. 1 by mouth once daily 2)  Atenolol 50 Mg Tabs (Atenolol) .Marland Kitchen.. 1 by mouth once daily 3)  Norvasc 10 Mg Tabs (Amlodipine besylate) .... Take 1 tablet once a day 4)  Adult Aspirin Low Strength 81 Mg Tbdp (Aspirin) .Marland Kitchen.. 1 once daily 5)  Vitamin D 1000 Unit Caps (Cholecalciferol) .Marland Kitchen.. 1 cap once daily 6)  Tramadol Hcl 50 Mg Tabs (Tramadol hcl) .... 1/2 tab as needed 7)  Voltaren 1 % Gel (Diclofenac sodium) .... 2 grams to upper extremities up to qid for arthritis pain  maximum 8 grams per day per joint. 8)  Allergy Relief 4 Mg Tabs (Chlorpheniramine maleate) .... As needed 9)  Fish Oil 1000 Mg Caps (Omega-3 fatty acids) 10)  Gelnique  11)  Augmentin 875-125 Mg Tabs (Amoxicillin-pot clavulanate) .Marland Kitchen.. 1 tab by mouth two times a day x 10 days 12)  Prednisone 20 Mg Tabs (Prednisone) .... 2 tabs by mouth once daily x 5days 13)  Symbicort 160-4.5 Mcg/act Aero (Budesonide-formoterol fumarate) .Marland Kitchen.. 1 puff by  mouth two times a day 14)  Tussionex Pennkinetic Er 10-8 Mg/51ml Lqcr (Hydrocod polst-chlorphen polst) .Marland Kitchen.. 1 tsp by mouth two times a day as needed cough  Patient Instructions: 1)  Take your antibiotic as prescribed until ALL of it is gone, but stop if you develop a rash or swelling and contact our office as soon as possible.  2)  Acute Bronchitis symptoms for less then 10 days are not  helped by antibiotics. Take over the counter cough medications. Call if no improvement in 5-7 days, sooner if increasing cough, fever, or new symptoms ( shortness of breath, chest pain) .  3)  Please schedule a follow-up appointment as needed if symptoms worsen or do not  improve. 4)  Take a yogurt, such as Activia, daily or a probiotic, such as Align, daily while on an antibiotic Prescriptions: TUSSIONEX PENNKINETIC ER 10-8 MG/5ML LQCR (HYDROCOD POLST-CHLORPHEN POLST) 1 tsp by mouth two times a day as needed cough  #4 oz x 0   Entered and Authorized by:   Danise Edge MD   Signed by:   Danise Edge MD on 11/01/2009   Method used:   Print then Give to Patient   RxID:   [id number] SYMBICORT 160-4.5 MCG/ACT AERO (BUDESONIDE-FORMOTEROL FUMARATE) 1 puff by mouth two times a day  #1 x 0   Entered and Authorized by:   Danise Edge MD   Signed by:   Danise Edge MD on 11/01/2009   Method used:   Samples Given   RxID:   [id number] PREDNISONE 20 MG TABS (PREDNISONE) 2 tabs by mouth once daily x 5days  #10 x 0   Entered and Authorized by:   Danise Edge MD   Signed by:   Danise Edge MD on 11/01/2009   Method used:   Electronically to        [facility]       [address]       [place], [place]  [address]       Ph: [phone]       Fax: [phone]   RxID:   [id number] AUGMENTIN 875-125 MG TABS (AMOXICILLIN-POT CLAVULANATE) 1 tab by mouth two times a day x 10 days  #20 x 0   Entered and Authorized by:   Danise Edge MD   Signed by:   Danise Edge MD on 11/01/2009   Method used:   Electronically to        [facility] (retail)       [address]       [place], [place]  [address]       Ph: [phone]       Fax: [phone]   RxID:   [id number]

## 2010-03-05 NOTE — Miscellaneous (Signed)
  Clinical Lists Changes  Observations: Added new observation of CARDCATHFIND: CONCLUSION:  Nonobstructive coronary plaque.  Normal left ventricular function.   PLAN:  No further cardiac workup is suggested.  The patient will have continued risk factor modification.   (12/13/2009 15:35)      Cardiac Cath  Procedure date:  12/13/2009  Findings:      CONCLUSION:  Nonobstructive coronary plaque.  Normal left ventricular function.   PLAN:  No further cardiac workup is suggested.  The patient will have continued risk factor modification.

## 2010-03-06 ENCOUNTER — Ambulatory Visit: Payer: Self-pay | Admitting: Cardiovascular Disease

## 2010-03-09 ENCOUNTER — Encounter: Payer: Self-pay | Admitting: Cardiovascular Disease

## 2010-03-09 ENCOUNTER — Ambulatory Visit (INDEPENDENT_AMBULATORY_CARE_PROVIDER_SITE_OTHER): Payer: Medicare Other | Admitting: Cardiovascular Disease

## 2010-03-09 DIAGNOSIS — I70219 Atherosclerosis of native arteries of extremities with intermittent claudication, unspecified extremity: Secondary | ICD-10-CM

## 2010-03-19 NOTE — Assessment & Plan Note (Signed)
Summary: lower extremity pain   Visit Type:  Follow-up Primary Provider:  [name]  CC:  Lower extremity pain.  History of Present Illness: 77 year-old woman presents for followup of lower extremity PAD. A few weeks ago she developed low back pain radiating down both legs with associated weakness. She has chronic left calf claudication with a cool left foot, but notes no change in these symptoms. Overall her pain has improved over the past 2 weeks. She would like to see Dr Ethelene Hal who has treatd her with injections in the past. She notes improved symptoms when leaning forward on a grocery cart. Also complains of worsening cough with productive sputum. No fever or chills. She is concerned about 'early pneumonia' in the setting of chronic lung disease.  Current Medications (verified): 1)  Imdur 60 Mg Xr24h-Tab (Isosorbide Mononitrate) .... Take 1 Tablet By Mouth Once A Day 2)  Atenolol 50 Mg Tabs (Atenolol) .... 1/2 By Mouth Daily 3)  Norvasc 10 Mg Tabs (Amlodipine Besylate) .... Take 1 Tablet Once A Day 4)  Adult Aspirin Low Strength 81 Mg  Tbdp (Aspirin) .Marland Kitchen.. 1 Once Daily 5)  Vitamin D 1000 Unit Caps (Cholecalciferol) .Marland Kitchen.. 1 Cap Once Daily 6)  Tramadol Hcl 50 Mg Tabs (Tramadol Hcl) .... 2 Tabs Q 6 Hours As Needed 7)  Voltaren 1 % Gel (Diclofenac Sodium) .... 2 Grams To Upper Extremities Up To Qid For Arthritis Pain  Maximum 8 Grams Per Day Per Joint. 8)  Fish Oil 1000 Mg Caps (Omega-3 Fatty Acids) .Marland Kitchen.. 1 Tab By Mouth Once Daily  Allergies: 1)  * Tequin (Gatifloxacin) 2)  * Oxycodone 3)  Verapamil Hcl Cr (Verapamil Hcl)  Past History:  Past medical history reviewed for relevance to current acute and chronic problems.  Past Medical History: CAROTID ARTERY DISEASE (ICD-433.10)last cath 11/05, last myoview neg 8/09   PERIPHERAL VASCULAR DISEASE (ICD-443.9) with intermittent claudication. Left SFA occlusion noted. HYPERTENSION (ICD-401.9) CORONARY ARTERY DISEASE (ICD-414.00) stendting RCA  2005    a.  cath 12/13/2009:  LAD prox luminal irregularities; D1 ostial 25%; CFX mid 25%; RCA stent 30%; EF 65%  THROMBOCYTOPENIA (ICD-287.5) spinal stenosis PERSONAL HISTORY OF URINARY CALCULI (ICD-V13.01) UNSPECIFIED ANEMIA (ICD-285.9) HYPERLIPIDEMIA (ICD-272.4) HEAD TRAUMA, CLOSED (ICD-959.01) POST-TRAUMATIC HEADACHE UNSPECIFIED (ICD-339.20) ? of SHINGLES, RECURRENT (ICD-053.9) DERMATITIS (ICD-692.9) UNSPECIFIED VITAMIN D DEFICIENCY (ICD-268.9) HYPOTHYROIDISM (ICD-244.9) COPD (ICD-496) SPINAL STENOSIS (ICD-724.00) POSTHERPETIC NEURALGIA (ICD-053.19) Hx of CARCINOMA, BASAL CELL (ICD-173.9) Chronic pain management foot and back per Dr. Ethelene Hal are probable degenerative joint disease Vitreous detachment Nephrolithiasis, hx of  Review of Systems       Negative except as per HPI   Vital Signs:  Patient profile:   77 year old female Menstrual status:  hysterectomy Height:      63 inches Weight:      101.75 pounds BMI:     18.09 Pulse rate:   84 / minute Pulse rhythm:   regular Resp:     18 per minute BP sitting:   134 / 60  (left arm) Cuff size:   regular  Vitals Entered By: Vikki Ports (March 09, 2010 3:11 PM)  Serial Vital Signs/Assessments:  Time      Position  BP       Pulse  Resp  Temp     By           R Arm     140/60  Vikki Ports   Physical Exam  General:  Pt is alert and oriented, very thin, frail elderly woman in no acute distress. HEENT: normal Neck: normal carotid upstrokes without bruits, JVP normal Lungs: scattered rhonchi bilaterally CV: RRR without murmur or gallop Abd: soft, NT, positive BS, no bruit, no organomegaly Ext: no clubbing, cyanosis, or edema. pedals 2+ on right and nonpalp on left. The toes on the left are cooler than the right. Skin: no rash or color change    ABI's  Procedure date:  09/15/2009  Findings:      ABI 0.97 right and 0.58 left  Impression & Recommendations:  Problem # 1:  PERIPHERAL  VASCULAR DISEASE (ICD-443.9) Pt's exam is stable - she has known total occlusion of the right SFA. Symptoms are consistent with spinal stenosis or other lumbar spine etiology of pain. I don't think her vascular disease has changed significantly since her last evaluation here. Recommend repeat ABI's in August and a followup visit at that time. Continue current medical program. She continues to smoke and understands the deleterious effects of tobacco on her health.   Considering her significant lung disease, I took the liberty of prescribing Azithromycin for her worsening pulmonary symptoms and cough. She will contact Dr Fabian Sharp if no improvement.  Other Orders: EKG w/ Interpretation (93000)  Patient Instructions: 1)  Your physician has recommended you make the following change in your medication: START Zpak 2)  Your physician wants you to follow-up in: 6 MONTHS.  You will receive a reminder letter in the mail two months in advance. If you don't receive a letter, please call our office to schedule the follow-up appointment. 3)  Your physician has requested that you have an ankle brachial index (ABI) in 6 MONTHS. During this test an ultrasound and blood pressure cuff are used to evaluate the arteries that supply the arms and legs with blood. Allow thirty minutes for this exam. There are no restrictions or special instructions. Prescriptions: ZITHROMAX Z-PAK 250 MG TABS (AZITHROMYCIN) take as directed  #1 x 0   Entered by:   Julieta Gutting, RN, BSN   Authorized by:   Norva Karvonen, MD   Signed by:   Julieta Gutting, RN, BSN on 03/09/2010   Method used:   Electronically to        [facility] ([facility]ail)       [address]       [place], [place]  [address]       Ph: [phone]       Fax: [phone]   RxID:   [id number]

## 2010-04-03 ENCOUNTER — Other Ambulatory Visit: Payer: Self-pay | Admitting: Internal Medicine

## 2010-04-13 ENCOUNTER — Encounter: Payer: Self-pay | Admitting: Cardiology

## 2010-04-13 ENCOUNTER — Ambulatory Visit (INDEPENDENT_AMBULATORY_CARE_PROVIDER_SITE_OTHER): Payer: Medicare Other | Admitting: Cardiology

## 2010-04-13 DIAGNOSIS — I2510 Atherosclerotic heart disease of native coronary artery without angina pectoris: Secondary | ICD-10-CM

## 2010-04-13 DIAGNOSIS — I10 Essential (primary) hypertension: Secondary | ICD-10-CM

## 2010-04-14 LAB — CBC
Hemoglobin: 12 g/dL (ref 12.0–15.0)
MCH: 30.7 pg (ref 26.0–34.0)
MCV: 92.7 fL (ref 78.0–100.0)
MCV: 95.1 fL (ref 78.0–100.0)
Platelets: 131 10*3/uL — ABNORMAL LOW (ref 150–400)
RBC: 3.91 MIL/uL (ref 3.87–5.11)
RBC: 4.36 MIL/uL (ref 3.87–5.11)
WBC: 5.8 10*3/uL (ref 4.0–10.5)
WBC: 6.4 10*3/uL (ref 4.0–10.5)

## 2010-04-14 LAB — DIFFERENTIAL
Basophils Absolute: 0 10*3/uL (ref 0.0–0.1)
Basophils Relative: 1 % (ref 0–1)
Eosinophils Absolute: 0.2 10*3/uL (ref 0.0–0.7)
Lymphs Abs: 1.4 10*3/uL (ref 0.7–4.0)
Neutrophils Relative %: 68 % (ref 43–77)

## 2010-04-14 LAB — BASIC METABOLIC PANEL
CO2: 27 mEq/L (ref 19–32)
Calcium: 8.7 mg/dL (ref 8.4–10.5)
Chloride: 107 mEq/L (ref 96–112)
Creatinine, Ser: 0.84 mg/dL (ref 0.4–1.2)
Creatinine, Ser: 0.9 mg/dL (ref 0.4–1.2)
GFR calc Af Amer: >60 mL/min (ref >60)
Glucose, Bld: 93 mg/dL (ref 70–99)
Potassium: 3.7 mEq/L (ref 3.5–5.1)

## 2010-04-14 LAB — HEPATIC FUNCTION PANEL
Albumin: 3.4 g/dL — ABNORMAL LOW (ref 3.5–5.2)
Total Protein: 6.4 g/dL (ref 6.0–8.3)

## 2010-04-14 LAB — PROTIME-INR
INR: 0.86 (ref 0.00–1.49)
Prothrombin Time: 11.9 seconds (ref 11.6–15.2)

## 2010-04-14 LAB — TROPONIN I: Troponin I: 0.01 ng/mL (ref 0.00–0.06)

## 2010-04-14 LAB — CK TOTAL AND CKMB (NOT AT ARMC): CK, MB: 2.3 ng/mL (ref 0.3–4.0)

## 2010-04-14 LAB — CARDIAC PANEL(CRET KIN+CKTOT+MB+TROPI)
Relative Index: INVALID (ref 0.0–2.5)
Troponin I: 0.04 ng/mL (ref 0.00–0.06)

## 2010-04-15 ENCOUNTER — Other Ambulatory Visit: Payer: Self-pay | Admitting: Internal Medicine

## 2010-04-15 NOTE — Telephone Encounter (Signed)
Please advise on refills.  

## 2010-04-15 NOTE — Telephone Encounter (Signed)
Can refill x 2  

## 2010-04-16 ENCOUNTER — Ambulatory Visit (INDEPENDENT_AMBULATORY_CARE_PROVIDER_SITE_OTHER): Payer: Medicare Other | Admitting: Internal Medicine

## 2010-04-16 ENCOUNTER — Encounter: Payer: Self-pay | Admitting: Internal Medicine

## 2010-04-16 VITALS — BP 120/80 | HR 60 | Temp 98.4°F | Wt 105.0 lb

## 2010-04-16 DIAGNOSIS — M1990 Unspecified osteoarthritis, unspecified site: Secondary | ICD-10-CM

## 2010-04-16 DIAGNOSIS — R109 Unspecified abdominal pain: Secondary | ICD-10-CM

## 2010-04-16 DIAGNOSIS — I739 Peripheral vascular disease, unspecified: Secondary | ICD-10-CM

## 2010-04-16 DIAGNOSIS — R1030 Lower abdominal pain, unspecified: Secondary | ICD-10-CM

## 2010-04-16 NOTE — Patient Instructions (Signed)
Take plain claritin or loratidine for allergy symptoms  We will do a referral to [facility] orthopedics about your groin and back pain.  You do not have an ear infection but allergy could be bothering hour ears .

## 2010-04-16 NOTE — Progress Notes (Signed)
Subjective:    Patient ID: [name] [name], female    DOB: 31-Oct-1927, 77 y.o.   MRN: [id number]  HPI  patient comes in for an acute visit for a number of issues.  #1:   She thinks that she pulled a muscle in her left groin when she was reaching and painting. It now hurts to walk and move a certain way and has pain in her left lower back radiating around and then down to her knee. She denies falling took a half a tramadol for pain the onset of this problem she states occurred this week.   She had been seen in January 4 hip pain also and had a negative x-ray she says of her right hip. She has known degenerative disease spinal stenosis and vascular disease.  #2:   Runny nose possible allergies no coughing or sneezing and no fever question what she can take.  #3:    Her ears are aching coming and going hearing has not changed would like them checked. Denies any sore throat.  #4:    She states that her arthritis is bothering a bit more most recently especially in her hands and all over but no associated fever or redness  Past Medical History  Diagnosis Date  . POSTHERPETIC NEURALGIA 08/25/2006  . CARCINOMA, BASAL CELL 02/13/2006  . NEOPLASM, SKIN, UNCERTAIN BEHAVIOR 12/16/2008  . HYPOTHYROIDISM 01/30/2007  . Unspecified vitamin D deficiency 02/07/2007  . HYPERLIPIDEMIA 03/10/2008  . UNSPECIFIED ANEMIA 03/18/2008  . THROMBOCYTOPENIA 07/30/2008  . TOBACCO USE 09/27/2006  . POST-TRAUMATIC HEADACHE UNSPECIFIED 01/17/2008  . LOSS, HEARING NOS 08/25/2006  . HYPERTENSION 02/13/2006  . UNSTABLE ANGINA 12/12/2009  . CORONARY ARTERY DISEASE 02/13/2006  . CAROTID ARTERY DISEASE 10/03/2006  . RENAL ARTERY STENOSIS 08/01/2008  . PERIPHERAL VASCULAR DISEASE 02/13/2006  . Chronic rhinitis 12/13/2006  . CHRONIC OBSTRUCTIVE PULMONARY DISEASE, ACUTE EXACERBATION 11/17/2009  . COPD 11/21/2006  . TMJ PAIN 08/25/2006  . Unspecified disorder of kidney and ureter 09/17/2008  . DERMATITIS 09/25/2007  . DEGENERATIVE JOINT  DISEASE 09/27/2006  . OSTEOARTHRITIS, HAND 12/16/2008  . SPINAL STENOSIS 11/01/2006  . Pain in limb 09/27/2006  . INSOMNIA 04/05/2008  . FATIGUE 09/08/2007  . WEIGHT LOSS 06/11/2008  . Underweight 01/17/2008  . FEMORAL BRUIT, RIGHT 01/02/2010  . Shortness of breath 07/31/2008  . Cough 11/05/2009  . GROIN PAIN 06/11/2008  . LIVER FUNCTION TESTS, ABNORMAL 01/22/2009  . UNS ADVRS EFF UNS RX MEDICINAL&BIOLOGICAL SBSTNC 01/30/2007  . Personal history of malignant neoplasm of breast 07/24/2008  . HEMATURIA, MICROSCOPIC, HX OF 02/13/2006  . ACCIDENTAL FALL, HX OF 06/11/2008  . HIP PAIN 02/26/2010  . NEPHROLITHIASIS, HX OF 02/27/2010   Past Surgical History  Procedure Date  . Lumbar laminectomy   . Cataract extraction   . Abdominal hysterectomy   . Breast lumpectomy   . Tonsillectomy   . Coronary angioplasty with stent placement     bilat iliac stents, left renal artery and rt coronary artery last myoview 8/09 with EF 70%  . Skin cancer excision     reports that she has quit smoking. She does not have any smokeless tobacco history on file. She reports that she drinks alcohol. She reports that she does not use illicit drugs. family history includes Breast cancer in her daughter and Tuberculosis in her father. Allergies  Allergen Reactions  . Verapamil     REACTION: CONSTIPATION   Review of Systems  peripheral vascular disease recently had her immature increased and her atenolol  decreased her blood pressure seems to be okay. Denies any syncope change in her respiratory status.    Objective:   Physical Exam  well-developed very slender alert cognitively intact elderly female in no acute distress except when she walks and moves a certain way. HEENT: Normocephalic ;atraumatic , Eyes;  PERRL, EOMs  Full, lids and conjunctiva clear,,Ears: no deformities, canals nl, TM landmarks normal, Nose: no deformity   Clear type of discharge no lesions  Mouth : OP clear without lesion or edema . Chest:  Clear to  A&P without wheezes rales or rhonchi decreased breath sounds bilaterally CV:  S1-S2 no gallops or murmurs peripheral perfusion  Shows normal capillary refill MS:   Significant OA changes right hand more than left but no redness perfusion. Chest some tenderness in her left lower back end on palpation in her left groin. I feel no masses range of motion of her hip is mildly impaired. Her gait is a bit antalgic favoring the left lower extremity       Assessment & Plan:   left back groin pain with radiation  Despite patient's history of still very concerned about back and hip pain progression. We'll do a referral to orthopedics.  Rhinorrhea possible allergic rhinitis okay to take plain antihistamine as a trial.   ear discomfort: no acute infection today possibly related to allergies no TMJ or other obvious radiation causes.  osteoarthritis:   Physical changes becoming more problematic avoid injury Tylenol pain medicine as needed.  peripheral vascular disease;    The notes from Dr. Jens Som and Excell Seltzer

## 2010-04-16 NOTE — Assessment & Plan Note (Signed)
Left lower back pain radiating to left groin that she states is from leaning over when she was painting however concern about radiating from back and hip problem. However she did have a hip x-ray  A couple months ago that showed just degenerative changes. This does not appear to be pain related to vascular disease. We'll get orthopedic consult in the meantime she'll take low-dose tramadol and Tylenol as needed and avoid falls.

## 2010-04-17 ENCOUNTER — Telehealth: Payer: Self-pay | Admitting: *Deleted

## 2010-04-17 NOTE — Telephone Encounter (Signed)
patient  Is calling because she is in a lot of pain and still has not heard from [facility].

## 2010-04-17 NOTE — Telephone Encounter (Signed)
We have faxed the order to [facility] and are waiting to hear back from them. I tried to call pt about this but her number was busy.

## 2010-04-17 NOTE — Telephone Encounter (Signed)
Spoke to pt and she has called [facility]. Pt did make her appt for tues.

## 2010-04-21 NOTE — Assessment & Plan Note (Signed)
Summary: 3 month [email or web address]   Primary Provider:  [name]   History of Present Illness: [name] [name] is a pleasant female who has a history of coronary artery disease, status post stenting of the right coronary artery in December 2005. She also has a history of cerebrovascular disease.  Her most recent carotid Dopplers were performed in Dec 2011.  There was a 40-59% bilateral stenosis.  Followup was recommended in 2 years. She also has a history of renal artery stenosis.  Last renal Dopplers were in August 2010.  Renal arteries showed no stenosis. There was a right renal mass.  However dedicated ultrasound showed a renal cyst. She also has peripheral vascular disease and has seen Dr. Excell Seltzer. An arteriogram revealed occlusion of the left SFA. It was unfavorable for intervention and medical therapy was recommended if possible. ABIs in August of 2011 was 0.6 on the left and 1.0 on the right. Patient had cardiac catheterization in November of 2011 secondary to chest pain which revealed left main normal.  The LAD had proximal luminal irregularities. First diagonal was large with ostial 25% stenosis.  Circumflex in the AV groove had mid long 25% stenosis.  Obtuse marginal was large and normal.  The right coronary artery with long in-stent 30% stenosis.  PDA was moderate size and normal. Posterolateral was small and normal.  EF 65. Since she was last seen, the patient denies any dyspnea on exertion, orthopnea, PND, pedal edema, palpitations, syncope or chest pain.   Current Medications (verified): 1)  Imdur 60 Mg Xr24h-Tab (Isosorbide Mononitrate) .... Take 1 Tablet By Mouth Once A Day 2)  Atenolol 50 Mg Tabs (Atenolol) .... 1/2 By Mouth Daily 3)  Norvasc 10 Mg Tabs (Amlodipine Besylate) .... Take 1 Tablet Once A Day 4)  Adult Aspirin Low Strength 81 Mg  Tbdp (Aspirin) .Marland Kitchen.. 1 Once Daily 5)  Vitamin D 1000 Unit Caps (Cholecalciferol) .Marland Kitchen.. 1 Cap Once Daily 6)  Voltaren 1 % Gel (Diclofenac Sodium) .... 2  Grams To Upper Extremities Up To Qid For Arthritis Pain  Maximum 8 Grams Per Day Per Joint. As Needed 7)  Fish Oil 1000 Mg Caps (Omega-3 Fatty Acids) .Marland Kitchen.. 1 Tab By Mouth Once Daily 8)  Mucinex 600 Mg Xr12h-Tab (Guaifenesin) .... As Needed  Allergies (verified): 1)  * Tequin (Gatifloxacin) 2)  * Oxycodone 3)  Verapamil Hcl Cr (Verapamil Hcl)  Past History:  Past Medical History: Reviewed history from 03/09/2010 and no changes required. CAROTID ARTERY DISEASE (ICD-433.10)last cath 11/05, last myoview neg 8/09   PERIPHERAL VASCULAR DISEASE (ICD-443.9) with intermittent claudication. Left SFA occlusion noted. HYPERTENSION (ICD-401.9) CORONARY ARTERY DISEASE (ICD-414.00) stendting RCA 2005    a.  cath 12/13/2009:  LAD prox luminal irregularities; D1 ostial 25%; CFX mid 25%; RCA stent 30%; EF 65%  THROMBOCYTOPENIA (ICD-287.5) spinal stenosis PERSONAL HISTORY OF URINARY CALCULI (ICD-V13.01) UNSPECIFIED ANEMIA (ICD-285.9) HYPERLIPIDEMIA (ICD-272.4) HEAD TRAUMA, CLOSED (ICD-959.01) POST-TRAUMATIC HEADACHE UNSPECIFIED (ICD-339.20) ? of SHINGLES, RECURRENT (ICD-053.9) DERMATITIS (ICD-692.9) UNSPECIFIED VITAMIN D DEFICIENCY (ICD-268.9) HYPOTHYROIDISM (ICD-244.9) COPD (ICD-496) SPINAL STENOSIS (ICD-724.00) POSTHERPETIC NEURALGIA (ICD-053.19) Hx of CARCINOMA, BASAL CELL (ICD-173.9) Chronic pain management foot and back per Dr. Ethelene Hal are probable degenerative joint disease Vitreous detachment Nephrolithiasis, hx of  Past Surgical History: Reviewed history from 12/12/2009 and no changes required. Lumbar laminectomy Cataract extraction Hysterectomy total  Lumpectomy Tonsillectomy PTCA/stent - bilat iliac stents, left renal artery, and right coronary artery, last myoview 8/09 wtih EF 70% skin cancer surgery Breast ca right lumpectomy?  1998 radiation  Social History:  Reviewed history from 02/27/2010 and no changes required. Current Smoker  stopped in Nov 08  some relapses Alcohol  use-yessocial widowed 5 hours of sleep     Has friends checking on her     no pets   has no running water in her kitchen turned off for renovation after a water leak. This has been going on since November.2011  Review of Systems       no fevers or chills, productive cough, hemoptysis, dysphasia, odynophagia, melena, hematochezia, dysuria, hematuria, rash, seizure activity, orthopnea, PND, pedal edema, claudication. Remaining systems are negative.   Vital Signs:  Patient profile:   77 year old female Menstrual status:  hysterectomy Height:      63 inches Weight:      102 pounds BMI:     18.13 Pulse rate:   62 / minute BP sitting:   134 / 55  (right arm)  Vitals Entered By: Marrion Coy, CNA (April 13, 2010 8:03 AM)  Physical Exam  General:  Well-developed frail in no acute distress.  Skin is warm and dry.  HEENT is normal.  Neck is supple. No thyromegaly.  Chest is diffuse exp wheeze Cardiovascular exam is regular rate and rhythm.  Abdominal exam nontender or distended. No masses palpated. Extremities show no edema. neuro grossly intact    Impression & Recommendations:  Problem # 1:  ESSENTIAL HYPERTENSION, BENIGN (ICD-401.1) Blood pressure controlled on present medications. Will continue. Her updated medication list for this problem includes:    Atenolol 50 Mg Tabs (Atenolol) .Marland Kitchen... 1/2 by mouth daily    Norvasc 10 Mg Tabs (Amlodipine besylate) .Marland Kitchen... Take 1 tablet once a day    Adult Aspirin Low Strength 81 Mg Tbdp (Aspirin) .Marland Kitchen... 1 once daily  Problem # 2:  LIVER FUNCTION TESTS, ABNORMAL (ICD-794.8) Being evaluated by GI. Continue off statin.  Problem # 3:  CAROTID ARTERY DISEASE (ICD-433.10) Followup carotid Dopplers December 2013. Continue aspirin. Her updated medication list for this problem includes:    Adult Aspirin Low Strength 81 Mg Tbdp (Aspirin) .Marland Kitchen... 1 once daily  Problem # 4:  RENAL ARTERY STENOSIS (ICD-440.1)  Problem # 5:  PERIPHERAL VASCULAR  DISEASE (ICD-443.9) Followed by Dr. Excell Seltzer. Continue aspirin.  Problem # 6:  CORONARY ARTERY DISEASE (ICD-414.00) Continue present medications. Recent catheterization revealed nonobstructive disease. Her updated medication list for this problem includes:    Imdur 60 Mg Xr24h-tab (Isosorbide mononitrate) .Marland Kitchen... Take 1 tablet by mouth once a day    Atenolol 50 Mg Tabs (Atenolol) .Marland Kitchen... 1/2 by mouth daily    Norvasc 10 Mg Tabs (Amlodipine besylate) .Marland Kitchen... Take 1 tablet once a day    Adult Aspirin Low Strength 81 Mg Tbdp (Aspirin) .Marland Kitchen... 1 once daily  Problem # 7:  TOBACCO USE (ICD-305.1) Patient trying to quit.  Problem # 8:  HYPERLIPIDEMIA (ICD-272.4) Continue diet. We'll consider statin in the future if liver functions improved.  Problem # 9:  HYPOTHYROIDISM (ICD-244.9)  Problem # 10:  COPD (ICD-496)  Patient Instructions: 1)  Your physician wants you to follow-up in:6 MONTHS  You will receive a reminder letter in the mail two months in advance. If you don't receive a letter, please call our office to schedule the follow-up appointment.

## 2010-04-28 ENCOUNTER — Telehealth: Payer: Self-pay | Admitting: *Deleted

## 2010-04-28 NOTE — Telephone Encounter (Signed)
Spoke with [name] [name], we received correspondence from dr Dorena Cookey with concerns regarding using statin meds and pts elevated liver function. Per dr Jens Som, we will not consider statins until liver function returns to normal. If pts liver function improves then we may retry. [name] [name] aware

## 2010-05-08 ENCOUNTER — Emergency Department (HOSPITAL_COMMUNITY)
Admission: EM | Admit: 2010-05-08 | Discharge: 2010-05-08 | Disposition: A | Discharge status: Home / Self Care | Payer: Medicare Other | Attending: Emergency Medicine | Admitting: Emergency Medicine

## 2010-05-08 ENCOUNTER — Emergency Department (HOSPITAL_COMMUNITY): Payer: Medicare Other

## 2010-05-08 DIAGNOSIS — Z79899 Other long term (current) drug therapy: Secondary | ICD-10-CM | POA: Insufficient documentation

## 2010-05-08 DIAGNOSIS — E039 Hypothyroidism, unspecified: Secondary | ICD-10-CM | POA: Insufficient documentation

## 2010-05-08 DIAGNOSIS — I779 Disorder of arteries and arterioles, unspecified: Secondary | ICD-10-CM | POA: Insufficient documentation

## 2010-05-08 DIAGNOSIS — I10 Essential (primary) hypertension: Secondary | ICD-10-CM | POA: Insufficient documentation

## 2010-05-08 DIAGNOSIS — Z853 Personal history of malignant neoplasm of breast: Secondary | ICD-10-CM | POA: Insufficient documentation

## 2010-05-08 DIAGNOSIS — R51 Headache: Secondary | ICD-10-CM | POA: Insufficient documentation

## 2010-05-08 DIAGNOSIS — Z7982 Long term (current) use of aspirin: Secondary | ICD-10-CM | POA: Insufficient documentation

## 2010-05-08 DIAGNOSIS — I2510 Atherosclerotic heart disease of native coronary artery without angina pectoris: Secondary | ICD-10-CM | POA: Insufficient documentation

## 2010-05-08 LAB — SEDIMENTATION RATE: Sed Rate: 17 mm/hr (ref 0–22)

## 2010-05-08 LAB — POCT I-STAT, CHEM 8
BUN: 20 mg/dL (ref 6–23)
Calcium, Ion: 1.22 mmol/L (ref 1.12–1.32)
Chloride: 106 mEq/L (ref 96–112)
Glucose, Bld: 100 mg/dL — ABNORMAL HIGH (ref 70–99)
Potassium: 3.8 mEq/L (ref 3.5–5.1)

## 2010-05-08 MED ORDER — GADOBENATE DIMEGLUMINE 529 MG/ML IV SOLN
10.0000 mL | Freq: Once | INTRAVENOUS | Status: AC
  Administered 2010-05-08: 10 mL via INTRAVENOUS

## 2010-05-19 LAB — URINALYSIS, MICROSCOPIC ONLY
Bilirubin Urine: NEGATIVE
Glucose, UA: 100 mg/dL — AB
Ketones, ur: NEGATIVE mg/dL
Leukocytes, UA: NEGATIVE
Nitrite: NEGATIVE
Protein, ur: NEGATIVE mg/dL
Specific Gravity, Urine: 1.022 (ref 1.005–1.030)
Urobilinogen, UA: 0.2 mg/dL (ref 0.0–1.0)
pH: 5.5 (ref 5.0–8.0)

## 2010-05-19 LAB — GLUCOSE, CAPILLARY: Glucose-Capillary: 153 mg/dL — ABNORMAL HIGH (ref 70–99)

## 2010-05-19 LAB — CBC
Hemoglobin: 11.8 g/dL — ABNORMAL LOW (ref 12.0–15.0)
MCHC: 34.8 g/dL (ref 30.0–36.0)
MCV: 90.1 fL (ref 78.0–100.0)
MCV: 91.4 fL (ref 78.0–100.0)
Platelets: 126 10*3/uL — ABNORMAL LOW (ref 150–400)
Platelets: 96 10*3/uL — ABNORMAL LOW (ref 150–400)
RBC: 4.25 MIL/uL (ref 3.87–5.11)
RDW: 13.6 % (ref 11.5–15.5)
RDW: 13.9 % (ref 11.5–15.5)
WBC: 10.9 10*3/uL — ABNORMAL HIGH (ref 4.0–10.5)

## 2010-05-19 LAB — CULTURE, BLOOD (ROUTINE X 2)
Culture: NO GROWTH
Culture: NO GROWTH

## 2010-05-19 LAB — BASIC METABOLIC PANEL
BUN: 7 mg/dL (ref 6–23)
CO2: 23 mEq/L (ref 19–32)
Calcium: 8.2 mg/dL — ABNORMAL LOW (ref 8.4–10.5)
Chloride: 107 mEq/L (ref 96–112)
Creatinine, Ser: 0.61 mg/dL (ref 0.4–1.2)
GFR calc non Af Amer: >60 mL/min (ref >60)
Glucose, Bld: 112 mg/dL — ABNORMAL HIGH (ref 70–99)
Glucose, Bld: 89 mg/dL (ref 70–99)
Potassium: 3.5 mEq/L (ref 3.5–5.1)
Sodium: 140 mEq/L (ref 135–145)

## 2010-05-19 LAB — URINE CULTURE

## 2010-05-19 LAB — POCT I-STAT 3, ART BLOOD GAS (G3+)
Bicarbonate: 22.6 mEq/L (ref 20.0–24.0)
Patient temperature: 96.9
pCO2 arterial: 34.3 mmHg — ABNORMAL LOW (ref 35.0–45.0)
pH, Arterial: 7.422 — ABNORMAL HIGH (ref 7.350–7.400)

## 2010-05-19 LAB — COMPREHENSIVE METABOLIC PANEL
ALT: 17 U/L (ref 0–35)
AST: 23 U/L (ref 0–37)
Albumin: 3.4 g/dL — ABNORMAL LOW (ref 3.5–5.2)
CO2: 24 mEq/L (ref 19–32)
Calcium: 9.2 mg/dL (ref 8.4–10.5)
Creatinine, Ser: 0.88 mg/dL (ref 0.4–1.2)
GFR calc Af Amer: >60 mL/min (ref >60)
Sodium: 143 mEq/L (ref 135–145)
Total Protein: 6.1 g/dL (ref 6.0–8.3)

## 2010-05-19 LAB — POCT CARDIAC MARKERS
CKMB, poc: <1 ng/mL — ABNORMAL LOW (ref 1.0–8.0)
Myoglobin, poc: 49.2 ng/mL (ref 12–200)
Troponin i, poc: <0.05 ng/mL (ref 0.00–0.09)

## 2010-05-19 LAB — POCT I-STAT, CHEM 8
Hemoglobin: 13.3 g/dL (ref 12.0–15.0)
Sodium: 143 mEq/L (ref 135–145)
TCO2: 23 mmol/L (ref 0–100)

## 2010-05-19 LAB — BRAIN NATRIURETIC PEPTIDE: Pro B Natriuretic peptide (BNP): 158 pg/mL — ABNORMAL HIGH (ref 0.0–100.0)

## 2010-05-19 LAB — DIFFERENTIAL
Basophils Absolute: 0 10*3/uL (ref 0.0–0.1)
Basophils Relative: 0 % (ref 0–1)
Eosinophils Absolute: 0.1 10*3/uL (ref 0.0–0.7)
Eosinophils Relative: 1 % (ref 0–5)
Lymphocytes Relative: 15 % (ref 12–46)
Lymphs Abs: 1.6 10*3/uL (ref 0.7–4.0)
Monocytes Absolute: 0.6 10*3/uL (ref 0.1–1.0)
Monocytes Relative: 6 % (ref 3–12)
Neutro Abs: 8.5 10*3/uL — ABNORMAL HIGH (ref 1.7–7.7)
Neutrophils Relative %: 78 % — ABNORMAL HIGH (ref 43–77)

## 2010-05-19 LAB — CARDIAC PANEL(CRET KIN+CKTOT+MB+TROPI)
CK, MB: 1.4 ng/mL (ref 0.3–4.0)
CK, MB: 2.1 ng/mL (ref 0.3–4.0)
Relative Index: INVALID (ref 0.0–2.5)
Total CK: 41 U/L (ref 7–177)
Total CK: 64 U/L (ref 7–177)
Troponin I: <0.01 ng/mL (ref 0.00–0.06)
Troponin I: <0.01 ng/mL (ref 0.00–0.06)

## 2010-05-19 LAB — PROTIME-INR
INR: 0.9 (ref 0.00–1.49)
Prothrombin Time: 12.7 seconds (ref 11.6–15.2)

## 2010-05-19 LAB — TROPONIN I: Troponin I: <0.01 ng/mL (ref 0.00–0.06)

## 2010-05-19 LAB — APTT: aPTT: 31 seconds (ref 24–37)

## 2010-05-19 LAB — CK TOTAL AND CKMB (NOT AT ARMC)
CK, MB: 0.9 ng/mL (ref 0.3–4.0)
Relative Index: INVALID (ref 0.0–2.5)

## 2010-05-22 ENCOUNTER — Encounter: Payer: Self-pay | Admitting: Internal Medicine

## 2010-06-02 ENCOUNTER — Telehealth: Payer: Self-pay | Admitting: *Deleted

## 2010-06-02 DIAGNOSIS — R51 Headache: Secondary | ICD-10-CM

## 2010-06-02 NOTE — Telephone Encounter (Signed)
Per Dr. Fabian Sharp- ok to refer.

## 2010-06-02 NOTE — Telephone Encounter (Signed)
Pt would like a referral to a neurologist (Dr. Vickey Huger or Dr. Sandria Manly )   She went to the ER with skull pain, and had a MRI, but the neurologist will not see her without a referral.

## 2010-06-03 ENCOUNTER — Telehealth: Payer: Self-pay | Admitting: *Deleted

## 2010-06-03 DIAGNOSIS — R519 Headache, unspecified: Secondary | ICD-10-CM

## 2010-06-03 NOTE — Telephone Encounter (Signed)
Dr. Fabian Sharp  Order neurology referral already.

## 2010-06-04 ENCOUNTER — Telehealth: Payer: Self-pay | Admitting: *Deleted

## 2010-06-04 ENCOUNTER — Ambulatory Visit (INDEPENDENT_AMBULATORY_CARE_PROVIDER_SITE_OTHER): Payer: Medicare Other | Admitting: Internal Medicine

## 2010-06-04 ENCOUNTER — Encounter: Payer: Self-pay | Admitting: Internal Medicine

## 2010-06-04 VITALS — BP 120/60 | HR 67 | Temp 98.4°F | Wt 101.0 lb

## 2010-06-04 DIAGNOSIS — R636 Underweight: Secondary | ICD-10-CM

## 2010-06-04 DIAGNOSIS — E785 Hyperlipidemia, unspecified: Secondary | ICD-10-CM

## 2010-06-04 DIAGNOSIS — H9319 Tinnitus, unspecified ear: Secondary | ICD-10-CM | POA: Insufficient documentation

## 2010-06-04 DIAGNOSIS — R61 Generalized hyperhidrosis: Secondary | ICD-10-CM | POA: Insufficient documentation

## 2010-06-04 DIAGNOSIS — I2510 Atherosclerotic heart disease of native coronary artery without angina pectoris: Secondary | ICD-10-CM

## 2010-06-04 DIAGNOSIS — Z853 Personal history of malignant neoplasm of breast: Secondary | ICD-10-CM

## 2010-06-04 DIAGNOSIS — J449 Chronic obstructive pulmonary disease, unspecified: Secondary | ICD-10-CM

## 2010-06-04 DIAGNOSIS — R945 Abnormal results of liver function studies: Secondary | ICD-10-CM

## 2010-06-04 DIAGNOSIS — R519 Headache, unspecified: Secondary | ICD-10-CM | POA: Insufficient documentation

## 2010-06-04 DIAGNOSIS — R51 Headache: Secondary | ICD-10-CM

## 2010-06-04 DIAGNOSIS — J329 Chronic sinusitis, unspecified: Secondary | ICD-10-CM

## 2010-06-04 MED ORDER — LEVOFLOXACIN 750 MG PO TABS: 750.0000 mg | ORAL_TABLET | Freq: Every day | ORAL | Status: DC

## 2010-06-04 MED ORDER — PREDNISONE 20 MG PO TABS: 20.0000 mg | ORAL_TABLET | Freq: Every day | ORAL | Status: AC

## 2010-06-04 MED ORDER — AMOXICILLIN-POT CLAVULANATE 875-125 MG PO TABS: 1.0000 | ORAL_TABLET | Freq: Two times a day (BID) | ORAL | Status: AC

## 2010-06-04 NOTE — Telephone Encounter (Signed)
Pharmacy called saying that pt is allergic to quinolones. Pt doesn't want to take this medication until she talks to Dr. Fabian Sharp first. I spoke with Dr. Fabian Sharp and she is unsure if she can take this or not at this time. So she wants to change her rx to augmentin 875 1 po bid x 10 days. Rx sent electronically, pharmacy called and pt called about this.

## 2010-06-04 NOTE — Patient Instructions (Signed)
We re going to  Treat you for a sinus infection and chest infection.  IN the meantime I agree with getting you to neurology  For the head pain.  Will also have ENT  See you  To checlk aout causes of the head pain and the  Large glands in your neck.

## 2010-06-04 NOTE — Assessment & Plan Note (Signed)
review to find previous assessment from other electronic record and paper.

## 2010-06-04 NOTE — Telephone Encounter (Signed)
quinolone not on allergy list.   Pharmacy has hx of tequin intolerance but no memory of what kind of problem.  Info not available in EHR  May be in  Archived paper chart .   Change med for this reason.

## 2010-06-04 NOTE — Progress Notes (Signed)
  Subjective:    Patient ID: [name] [name], female    DOB: [birth year], 77 y.o.   MRN: [id number]  HPI Patient comes in today for an acute visit because of some roaring in her ears and ear pain. He had recently been to the emergency room for skull pain mostly in the back of her head up from her neck and had a MRI scan and lab work done. There were no acute changes but some old ccortical CVA changes  Empty sella and some slight fluid in one of the left  mastoids. She also had seen Dr.Ramosfor this and had some some type of point injection which didn't help.  She has requested a neurology referral because of continued pain for which he is taking a small amount of tramadol.  However today and yesterday she has had pain in the area in front of her ear bilaterally. No change in vision her hearing is always been down but some whooshing sound that time.   No fever but has sweating at night. She denies knowledge of sleep apnea. She does have cardiovascular disease but no chest pain or change in shortness of breath. She states the glands in front of her neck are larger than usual but not awfully tender. She has a chronic cough but states the first breathing is okay but has lots of drainage down the back of her throat and stuffy nose. There is no recent falling.   Review of Systems See history of present illness no unusual new rashes. Had groin pain that responded and improved to a injection given by Dr. Ethelene Hal. Rest as per hpi  Past history family history social history reviewed in the electronic medical record.     Objective:   Physical Exam Well-developed very slender alert cognitively intact white female in no acute distress but she points to her back skull is area of pain. She states this isn't a headache but it is skull pain. Atraumatic TMs are clear nares congested and runny face nontender eyes not read EOMs full OP moist mucous membranes no obvious lesion neck negative midline tenderness  there is very prominent 3 cm glands in the submandibular area question salivary versus other.  She is tender over the TMJ area also but not over the temporal artery.   Chest: Unlabored respirations but diffuse wheezing and rhonchi throughout. Cardiac regular rhythm no gallops noted. Negative CCE.  Neurologic:  Grossly intact. Hearing and vision not checked      Assessment & Plan:  Head pain   not really a headache but is severe question cervicogenic versus neuralgia versus other  mastoid fluid on the left. Neurology appt pending  Upper respiratory congestion and possible acute exacerbation of COPD although the patient denies chest symptoms her exam is full of wheezes or rhonchi. We treat for secondary infection and possible sinusitis.    Vascular disease  Not on lipid med cause  Of some ? About  abnomal liver tests .   Has PVD and cad  And evidence of old cortical infarct in mri Night sweats   Last chest x ray fall 2011 and has these sx for a while .   ? If cardiac hypoxia at night?   Has had Gi eval for  malignancy . ? If should have overnight pulse ox and or pulmonary sleep eval. COPD  Tobacco.

## 2010-06-11 ENCOUNTER — Encounter: Payer: Self-pay | Admitting: Internal Medicine

## 2010-06-16 NOTE — Progress Notes (Signed)
[facility]                        PERIPHERAL VASCULAR OFFICE NOTE   NAME:Boal, [name] [name]                MRN:          [id number]  DATE:03/26/2008                            DOB:          [birth year]    REASON FOR VISIT:  Followup lower extremity PAD.   HISTORY OF PRESENT ILLNESS:  [name] [name] is a 77 year old woman with  lifestyle-limiting left leg claudication.  She underwent lower extremity  angiography earlier this month and was found to have long total  occlusion of the left superficial femoral artery.  Her left ABI is 0.5.  Because of her unfavorable anatomy, I have initially recommended medical  therapy.  I did not feel that her symptoms warrant consideration of fem-  pop bypass especially considering her advanced age.  Stenting would be  technically feasible, but it would require long overlapping stents  throughout her SFA and long-term results for that generally are  unsatisfactory.   She has not had any recent leg pain.  She was hospitalized later in the  week following her diagnostic angiogram because of hypoxemic respiratory  failure.  She was found to have pneumonia.  She was treated with  antibiotics and had fairly rapid improvement.  She has now completed her  antibiotics and still complains of generalized fatigue, but her  respiratory symptoms have improved.  She has had no rest pain of the  legs.  Her activity level has been limited since her hospital discharge  on February 10.   MEDICATIONS:  1. Norvasc 10 mg daily.  2. Aspirin 81 mg daily.  3. Atenolol 50 mg daily.  4. Imdur 30 mg daily.  5. Vitamin D 1 g daily.  6. Crestor 10 mg daily.   ALLERGIES:  TEQUIN and OXYCODONE.   PHYSICAL EXAMINATION:  GENERAL:  This is an thin elderly woman in no  acute distress.  VITAL SIGNS:  Weight is 105 pounds, blood pressure 130/60, heart rate  60, respiratory rate is 20.  EXTREMITIES:  Focused exam of the legs demonstrates no  clubbing,  cyanosis, or edema.  There are diffuse ecchymoses over the skin of both  the arms and legs.  The pedal pulses in the right foot are 2+ and left  foot, they are not palpable.  SKIN:  Warm and dry on both feet.  There were no ischemic ulcerations.  LUNGS:  Clear bilaterally.  HEART:  Regular rate and rhythm without murmurs or gallops.   ASSESSMENT:  This is a 77 year old woman with history of longstanding  tobacco use with lifestyle-limiting intermittent claudication.  Her  symptoms are attributable to long total occlusion of the left  superficial femoral artery.  Recommend continued medical therapy for the  reasons stated above in the HPI.  If she has progressive symptoms or  development of critical limb ischemia, she could certainly be considered  for revascularization at that point.  However, in the absence of those  findings, she should do reasonably well with medical therapy.  She is at  goal under the care of Dr. Fabian Sharp.  She should remain on aspirin 81 mg  daily as well.  Continue with statin  therapy as tolerated.   For followup, I would like to see her back in 6 months.     [name] [name]. Excell Seltzer, MD     MDC/MedQ  DD: 03/26/2008  DT: 03/27/2008  Job #: [id number]   cc:   Neta Mends. Fabian Sharp, MD  Madolyn Frieze Jens Som, MD, [facility]

## 2010-06-16 NOTE — Assessment & Plan Note (Signed)
[facility]                            CARDIOLOGY OFFICE NOTE   NAME:Shannahan, TVISHA SCHWOERER                MRN:          [id number]  DATE:02/06/2008                            DOB:          Feb 28, 1927    Ms. Balderston is a pleasant female who has a history of coronary artery  disease, status post stenting of the right coronary artery in December  2005.  Her most recent Myoview was performed on September 15, 2007.  At  that time, she had an ejection fraction 70%.  There was no ischemia or  infarction.  She also has a history of cerebrovascular disease.  Her  most recent carotid Dopplers were performed on September 15, 2007.  There  was a 40-59% bilateral stenosis.  Followup was recommended in 1 year.  She also has a history of renal artery stenosis.  Last renal Dopplers  were on August 22, 2007.  There was a 1-59% right renal artery stenosis  and a widely patent left renal criteria.  She also has peripheral  vascular disease and recently had ABIs.  She had severe likely  multivessel disease on the left and at least moderate disease on the  right.  Since I last saw her, she denies any increased dyspnea, chest  pain, palpitations, or syncope.  There is no pedal edema.  She does have  some pain in her legs that is chronic.  She states that they are weak.  However, there also appears to be some pain in a cast with ambulation.  This appears to be unchanged in severity compared to previous, but  continues to limit her activities.   MEDICATIONS:  1. Norvasc 10 mg p.o. daily.  2. Aspirin 81 mg p.o. daily.  3. Atenolol 50 mg p.o. daily.  4. Imdur 30 mg p.o. daily.  5. Voltaren.  6. Vitamin D.  7. Crestor 10 mg p.o. daily.   PHYSICAL EXAMINATION:  VITAL SIGNS:  Blood pressure of 120/61 and pulse  56.  HEENT:  Normal.  NECK:  Supple.  CHEST:  Clear.  CARDIOVASCULAR:  Regular rate.  ABDOMEN:  No tenderness.  EXTREMITIES:  Her right femoral pulse is 2+.  The left  is diminished.  There are bilateral bruits.  Chronic skin changes.  Her distal pulses  are not palpable.   Her electrocardiogram shows a sinus rhythm at a rate of 56.  There is  left axis deviation.  There is a prior septal infarct.  There are no ST  changes noted.   DIAGNOSES:  1. Claudication - the patient does have significant peripheral      vascular disease on her ankle-brachial indexes.  I will schedule      her to see  Dr. Excell Seltzer for further evaluation.  She may need      arteriogram and intervention.  She will continue on her aspirin and      statin.  2. Coronary artery disease - Ms. Kittler has not had chest pain or      shortness of breath.  Her recent Myoview showed no ischemia.  We      will continue  with medical therapy including her aspirin, statin,      and beta-blocker.  3. Hyperlipidemia - she appears to be tolerated the Crestor well.  She      is scheduled to have lipids and liver later this month.  4. History of spinal stenosis.  5. Carotid stenosis - she will need follow up carotid Dopplers in      August 2010.  6. Status post stenting on the left - she will need follow up renal      Dopplers in August 2010.  I will check a BMET to follow potassium      and renal function.  I will see her back in 6 months.     Madolyn Frieze Jens Som, MD, [facility]  Electronically Signed    BSC/MedQ  DD: 02/06/2008  DT: 02/06/2008  Job #: [id number]

## 2010-06-16 NOTE — Cardiovascular Report (Signed)
[name] [name]         ACCOUNT NO.:  [id number]   MEDICAL RECORD NO.:  [id number]          PATIENT TYPE:  AMB   LOCATION:  SDS                          FACILITY:  [facility]   PHYSICIAN:  [name] [name], [name] [name]  DATE OF BIRTH:  05/19/1927   DATE OF PROCEDURE:  03/06/2008  DATE OF DISCHARGE:  03/06/2008                            CARDIAC CATHETERIZATION   PROCEDURE:  Abdominal aortogram with runoff.   INDICATIONS:  [name] [name] is a 77 year old woman with a long history of  peripheral arterial disease.  She has had iliac stents in both legs.  She presents with progressive left leg claudication and underwent an ABI  that was markedly abnormal in the left at 0.5.  The right ABI was 1.0.  Her exercise ABIs were also highly abnormal on the left where the wave  forms were inaudible with 2-1/2 minutes of exercise.  She was referred  for angiography to assess her treatment options.   PROCEDURE IN DETAIL:  Risks and indications of procedure were reviewed  with the patient.  Informed consent was obtained.  The right groin was  prepped, draped, and anesthetized with 1% lidocaine using modified  Seldinger technique.  A 5-French sheath was placed in the right femoral  artery.  The pigtail catheter was advanced into the suprarenal aorta and  a suprarenal aortogram was performed under digital subtraction.  The  pigtail catheter was then brought down to the distal aorta and a distal  aortogram with runoff to the feet was performed using the bolus chase  method.  Angulated views over the iliacs and femoral arteries were then  performed.  The patient tolerated the procedure well.  There were no  immediate complications.  Catheter exchanges were performed over a  guidewire.   FINDINGS:  The abdominal aorta is patent with a small aneurysmal area in  the distal aorta and associated heavy calcification.   There are single renal arteries bilaterally.  The right renal artery is  patent with mild  proximal stenosis.  The left renal artery has a patent  stent with mild diffuse restenosis.   Right leg:  The common iliac artery is stented.  The stented segment has  mild diffuse restenosis with no significant obstructive disease.  There  is approximate 30% stenosis throughout the stent.  The vessel is  essentially widely patent.  The internal iliac is severely diseased and  is small on the right.  The external iliac is patent.  Common femoral is  patent.  Profunda is patent.  The superficial femoral is patent with  mild diffuse disease.  The popliteal artery is patent.  There is three-  vessel runoff to the right foot with diffuse disease in the posterior  tibial artery.  There are no high-grade obstructive lesions throughout  the right leg arterial system.   Left leg:  The common iliac artery is patent with nonobstructive 30-40%  stenosis.  The internal iliac is patent with 50% proximal stenosis.  There is a stent in the proximal external iliac artery that is widely  patent with no significant restenosis.  Beyond the stented segment,  there is a  50% stenosis followed by an 80% stenosis at the junction of  the external iliac and common femoral artery.  The common femoral artery  is then patent.  The profunda is a large vessel supplying robust  collaterals to the distal superficial femoral artery.  The superficial  femoral artery is occluded in its proximal segment.  There is a long  total occlusion extending into Hunter canal.  The remaining portions of  the SFA and popliteal after the vessel was reconstituted has only mild  nonobstructive disease.  The anterior tibial has a high origin and  courses down with patency to the foot.  The posterior tibial is  occluded.  The main runoff to the foot is via the peroneal artery.   ASSESSMENT:  1. Long left superficial femoral artery total occlusion.  2. Patent bilateral iliac stents.  3. Three-vessel runoff to the right foot.  4.  Two-vessel runoff to the left foot.   RECOMMENDATIONS:  Initially, I think it is best to treat [name] [name]  medically.  She has a long total occlusion and has small vessels.  She  continues to smoke cigarettes.  I think long-term stent patency would be  very poor in her case.  We will discuss this with her but I would favor  treating her medically at present.  She does not have clinical signs of  critical limb ischemia.      [name] [name], [name] [name]  Electronically Signed     MDC/MEDQ  D:  03/06/2008  T:  03/06/2008  Job:  [id number]

## 2010-06-16 NOTE — Assessment & Plan Note (Signed)
[name]                            CARDIOLOGY OFFICE NOTE   NAME:[name] HAYMAKER                MRN:          [id number]  DATE:07/29/2006                            DOB:          Jun 06, 1927    HISTORY OF PRESENT ILLNESS:  [name] [name] is a 77 year old lady with  atherosclerotic coronary disease for which she is status post stenting  of the right coronary artery in December of 2005.  She has preserved  left ventricular systolic function.  She has generally been doing very  nicely.  She has not had any chest discomfort, exertional dyspnea, PND,  orthopnea, or edema.  She has had no syncope or palpitations.  Unfortunately, she does continue to smoke.   CURRENT MEDICATIONS:  1. Altace 10 mg daily.  2. Norvasc 10 mg daily.  3. Lipitor 40 mg daily.  4. Aspirin 81 mg daily.  5. Atenolol 50 mg daily.  6. Imdur 30 mg daily.  7. Voltaren gel to her right foot.   PHYSICAL EXAMINATION:  She generally well appearing.  In no distress.  Heart rate 61.  Blood pressure 115/60.  Weight 111 pounds.  Weight is  down 8 pounds since a year ago.  She has no jugular venous distention, thyromegaly, or lymphadenopathy.  LUNGS:  Clear have scattered rhonchi, which clear with cough.  She has a nondisplaced point of maximal cardiac impulse.  There is a  regular rate and rhythm without murmur, rub, or gallop.  ABDOMEN:  Soft.  Non-distended.  Non-tender.  There is no  hepatosplenomegaly.  Bowel sounds are normal.  Carotid pulses are 2+ bilaterally with a soft bruit on the left.   IMPRESSION/RECOMMENDATIONS:  1. Coronary disease:  Remains asymptomatic.  Continue secondary      prevention with aspirin, ACE inhibitor, beta blocker, and statin.  2. Hypercholesterolemia:  Followed by Dr. Drue Novel.  Goal LDL less than 70.  3. Spinal stenosis:  Followed by Dr. Drue Novel and Dr. Ethelene Hal.  4. Carotid stenosis:  Asymptomatic.  Due repeat duplex in August 2007.  5. Renal artery  stenosis:  Status post stenting on the left.  Blood      pressure nicely controlled.  Creatinine was 1 a year ago.   We will have her follow up with Dr. Jens Som in a year.    Salvadore Farber, MD  Electronically Signed   WED/MedQ  DD: 07/29/2006  DT: 07/29/2006  Job #: [id number]

## 2010-06-16 NOTE — Discharge Summary (Signed)
NAME:  CHARDAI, GANGEMI         ACCOUNT NO.:  [id number]   MEDICAL RECORD NO.:  [id number]          PATIENT TYPE:  INP   LOCATION:  5530                         FACILITY:  [facility]   PHYSICIAN:  Valerie A. Felicity Coyer, MDDATE OF BIRTH:  02-28-1927   DATE OF ADMISSION:  03/10/2008  DATE OF DISCHARGE:  03/13/2008                               DISCHARGE SUMMARY   ADDENDUM:  Please see previous discharge summary for full details of  discharge diagnoses and summary of hospital course.  This change is to  reflect a need to correct previously stated antibiotics.  Upon further  discussion with Pharmacy, the patient's allergy to TEQUIN in the past  was tongue swelling.  As such, I have cancelled her prescription for  Avelox.  She did not receive Avelox during this hospitalization and her  outpatient prescription has been revoked, instead she will be given  azithromycin 500 mg x1 now with a prescription for 2 additional days to  follow discharge.  Likewise, she will be given Ceftin 500 mg p.o. x1 now  with an additional 7 days to follow, dispensed 14.  This antibiotic  combination is for the completion of a 10-day course of community-  acquired pneumonia in the setting of FLUOROQUINOLONE allergy.  All other  medications and problems are as previously listed in prior discharge  summary.      Valerie A. Felicity Coyer, MD  Electronically Signed     VAL/MEDQ  D:  03/13/2008  T:  03/13/2008  Job:  [id number]

## 2010-06-16 NOTE — Assessment & Plan Note (Signed)
[facility]                            CARDIOLOGY OFFICE NOTE   NAME:[name] [name]                MRN:          [id number]  DATE:07/13/2007                            DOB:          12-Apr-1927     [name] [name] is a 77 year old female with a history of coronary artery  disease status post stenting of the right coronary artery in December  2005.  She has previously been followed by Dr. Samule Ohm.  Her most recent  catheterization was in November 2005.  At that time, she was found to  have a 30% to 40% right coronary artery.  There was a 30% LAD and a 40%  circumflex.  Her ejection fraction was normal.  Her most recent  echocardiogram was in July 2007.  Her ejection fraction was normal.  There was trivial aortic and mitral regurgitation, as well as tricuspid  regurgitation.  Her most recent carotid Doppler was in August 2008,  which showed a 40% to 59% bilateral internal carotid artery stenosis.  She also has peripheral vascular disease.  Since I last saw her, she  denies any dyspnea on exertion, orthopnea, PND, pedal edema,  palpitations, presyncope, syncope, or chest pain.  She does have some  problems with bilateral lower extremity weakness.  She has a history of  spinal stenosis and she has some pain in her hips with ambulation.  She  also has some cramping in her feet at night and wonders whether it may  be related to Lipitor.   MEDICATIONS:  1. Altace 10 mg p.o. daily.  2. Norvasc 10 mg p.o. daily.  3. Aspirin 81 mg p.o. daily.  4. Atenolol 50 mg p.o. daily.  5. Imdur 30 mg p.o. daily.  6. Voltaren.  7. Lipitor 80 mg p.o. daily.  8. Vitamin D.   Her physical exam shows a blood pressure of 115/69 and her pulse is 51.  She weighs 114 pounds.  Her HEENT is normal.  Her neck is supple with no  bruits.  Her chest is clear.  Cardiovascular exam reveals a regular  rhythm.  Abdominal exam shows no tenderness.  I cannot appreciate a  bruit or  pulsatile mass.  Extremities show no edema.   Her electrocardiogram shows a sinus rhythm at a rate of 51.  The axis is  normal.  A prior septal infarct cannot be excluded.   DIAGNOSES:  1. Coronary artery disease - [name] [name] is now 4 years since her      previous evaluation.  We will plan to proceed with an adenosine      Myoview.  If it shows no ischemia, we will continue with medical      therapy.  She will continue on her aspirin, ACE inhibitor, beta-      blocker, and statin.  2. Hyperlipidemia - She will continue on her Lipitor.  We will check      lipids and liver, and adjust as indicated.  Note, she is      complaining of some weakness in her legs and wonders whether it may      be related to  her Lipitor.  I think this is more likely related to      spinal stenosis.  However, I did offer that if she would like to      discontinue this for several weeks to see if her symptoms improve,      then we would do this.  She will contact us if she wants to do this      in the future.  3. History of spinal stenosis.  4. Carotid stenosis - She will need followup carotid Dopplers in      August 2009.  5. History of renal artery stenosis status post stenting on the left -      We will plan to recheck her creatinine with a BMET.  Note, her      blood pressure is normal today.   We will see her back in 9 months.     Madolyn Frieze Jens Som, MD, [facility]  Electronically Signed    BSC/MedQ  DD: 07/13/2007  DT: 07/13/2007  Job #: [id number]

## 2010-06-16 NOTE — Progress Notes (Signed)
[facility]                        PERIPHERAL VASCULAR OFFICE NOTE   NAME:[name], ALICEN [name]                MRN:          [id number]  DATE:02/23/2008                            DOB:          [birth year]    REFERRING PHYSICIAN:  Madolyn Frieze. Jens Som, MD, [facility]   REASON FOR CONSULTATION:  Lower extremity PAD with intermittent  claudication.   HISTORY OF PRESENT ILLNESS:  [name] is a 77 year old woman with  coronary and peripheral arterial disease.  She has had diffuse lower  extremity PAD and has undergone previous percutaneous interventions of  both legs several years back by Dr. Chales Abrahams.  She has also had renal  stenting performed.  She presents today for evaluation of progressive  claudication.  She describes progressive bilateral thigh and calf aching  with activity.  Her symptoms are much worse on the left than the right.  She also has spinal stenosis and has some difficulty differentiating her  leg pain, but clearly her left leg aching is progressive and lifestyle  limiting at present.  She likes to work outside in her yard and has been  unable to do so because of left leg pain.  She also complains of the  left foot feeling cold to her.  She is able to walk better when lying  forward on a cart, but still has problems with her left leg even with  leaning forward.  She denies skin ulcerations or nonhealing wounds.  She  denies rest pain.   In reviewing her prior procedures, it appears she has undergone left  renal artery stenting back in 2001 and angioplasty of the right  superficial femoral artery as well as stent placement of the right  external iliac artery.  When she had lower extremity angiography in  2003, the left common and external iliac arteries had diffuse disease  and the left external iliac artery was stented at that time with a 7- x  18-mm Genesis stent.   MEDICATIONS:  1. Norvasc 10 mg daily.  2. Aspirin 81 mg daily.  3.  Atenolol 50 mg daily.  4. Imdur 30 mg daily.  5. Vitamin D 1 g daily.  6. Crestor 10 mg daily.   Allergies include ROXICODONE and    .   PAST MEDICAL HISTORY:  1. CAD status post PCI.  2. PAD status post iliac and SFA angioplasty and stenting.  3. Hypertension.  4. Long-term tobacco abuse.  5. Dyslipidemia.  6. Spinal stenosis.  7. Carotid stenosis.   SOCIAL HISTORY:  The patient is originally from [place].  She lives  alone here in [place].  She is a long-time smoker on and off over the  years.  She is retired.   REVIEW OF SYSTEMS:  The patient has lost 20 pounds over the past few  years.  She denies constitutional symptoms.  She has no other symptoms  to report except as reviewed above in the HPI.   PHYSICAL EXAMINATION:  GENERAL:  This is a thin elderly woman in no  acute distress.  VITAL SIGNS:  Weight is 101 pounds, blood pressure 130/62, heart rate  60, respiratory  rate is 16.  HEENT:  Normal.  NECK:  Normal carotid upstrokes with bilateral bruits.  JVP normal.  No  thyromegaly or thyroid nodules.  LUNGS:  Clear bilaterally.  HEART:  Regular rate and rhythm.  No murmurs or gallops.  ABDOMEN:  Soft and nontender.  The aortic pulsation is easily palpable  and does not feel enlarged.  BACK:  No CVA tenderness.  EXTREMITIES:  Femoral, DP, and PT pulses are all 2+ on the right.  On  the left, the femoral pulses trace and the DP and PT pulses are not  palpable.  SKIN:  Warm and dry on the right leg, slightly cool on the left leg.  There are multiple ecchymoses on the legs.  The legs are very thin.  NEUROLOGIC:  Cranial nerves II through XII are intact.  Strength is  intact and equal.   Lower extremity ABIs were performed on January 12, 2008, and were 1.0  on the right and 0.5 on the left.  There was suggestion of inflow  disease on the left side.  A full duplex study was not performed.  Exercise ABIs were performed on January 19, 2008, where the patient was  able  to walk for 2-1/2 minutes.  The right ABI initially dropped but  then returned to baseline.  The left ankle arteries were inaudible until  6 minutes and never returned to baseline.   ASSESSMENT:  This is a 77 year old woman with lower extremity peripheral  arterial disease as outlined above.  She likely has severe left leg  arterial occlusive disease, and her exam is suggestive of inflow and  multilevel disease.  She has lifestyle-limiting claudication.  I  discussed her options, and we have decided to proceed with an angiogram  to see if she has a treatable lesion in the aortoiliac region.  The  things are going against her from the standpoint of long-term success  with revascularization are her very thin body habitus and ongoing  tobacco use.  Depending on the findings at the time of arteriogram, we  will decide whether to proceed with continued medical therapy versus  revascularization.  Risks and indications were reviewed in detail with  the patient.  She will continue on her current medical program, and we  will follow up at the time of her angiogram.    [name]. Excell Seltzer, MD  Electronically Signed   MDC/MedQ  DD: 02/23/2008  DT: 02/24/2008  Job #: [id number]   cc:   Madolyn Frieze. Jens Som, MD, [facility]

## 2010-06-16 NOTE — Discharge Summary (Signed)
NAME:  [name] [name], [name] [name]         ACCOUNT NO.:  [id number]   MEDICAL RECORD NO.:  [id number]          PATIENT TYPE:  INP   LOCATION:  5530                         FACILITY:  [facility]   PHYSICIAN:  Valerie A. Felicity Coyer, MDDATE OF BIRTH:  April 21, 1927   DATE OF ADMISSION:  03/10/2008  DATE OF DISCHARGE:  03/13/2008                               DISCHARGE SUMMARY   DISCHARGE DIAGNOSES:  1. Acute hypoxic respiratory failure secondary to left-sided      pneumonia, no evidence for pulmonary embolus.  Continue antibiotic      therapy for a total of 10 days.  See details below.  2. Chronic obstructive pulmonary disease with recent tobacco      cessation, encouraged continued cessation, home O2 p.r.n. if      qualifies, see details below.  3. Chest pain pleuritic secondary to acute hypoxic respiratory      failure.  No evidence of cardiac abnormality.  Continue Ultram      p.r.n. pain as prior to admission.  4. History of coronary artery disease.  Last catheterization November      2005 with negative Myoview August 2009.  Continue medical      management.  5. Peripheral vascular disease with bilateral internal carotid artery      stenosis, left superficial femoral artery stent, left internal      carotid artery stenosis and bilateral iliac stents, no acute      issues.  Continue medical management.  6. Chronic low back pain secondary to degenerative disk disease.      Continue Ultram p.r.n. as prior to admission with outpatient      followup with [facility] as needed.  7. Hypertension.  8. Dyslipidemia.  9. Hypokalemia, replaced prior to discharge.  Outpatient followup as      needed.  10.Anemia.  Expect delusional with no clinical evidence of active      bleeding, discharge hemoglobin 10.6.  11.Thrombocytopenia, suspect problem related to acute hypoxic      respiratory failure secondary to left-sided pneumonia.  No adverse      medications noted.  Outpatient followup with primary care physician      as needed.   DISCHARGE MEDICATIONS:  1. Avelox 400 mg p.o. daily x7 additional days to complete 10-day      antibiotic course.  2. Tramadol 50 mg one p.o. q.4 h. p.r.n. pain dispense 30.   Other medications are as prior to admission and include:  1. Norvasc 10 mg daily.  2. Crestor 10 mg daily.  3. Aspirin 81 mg daily.  4. Atenolol 50 mg daily.  5. Imdur 30 mg daily.  6. Vitamin D 1000 units by mouth daily.   DISPOSITION:  The patient is discharged home where she lives  independently, medically stable in improved condition.  She has  conflicting oxygen requirements reported as 92% on 1 L at rest but 95%  post exertion on room air.  We will recheck the patient's exertional O2  sats and arrange for home O2 if qualifies based on room air sats with  exertion and rest.  The patient is reminded again for the need for  tobacco cessation regardless of home O2 needs.  Hospital followup is  scheduled with primary care physician, Dr. Berniece Andreas for Friday,  March 22, 2008 at 3:15 p.m.  Should there be problems with this time,  the patient will contact primary care physician to reschedule for close  followup.   CONSULTATIONS:  PT who felt the patient has no home health needs.   CONDITION ON DISCHARGE:  Medically stable, symptomatically improved and  anxious for discharge home.   HOSPITAL COURSE:  1. Acute hypoxic respiratory failure secondary to pneumonia.  The      patient is a pleasant 77 year old woman with history of sinus      problems, status post two rounds of antibiotics previously this      year by primary care physician who also recently discontinued her      smoking habit who presented to the emergency room on the day of      admission due to shortness of breath.  She was found to have O2      sats in the low 80s on presentation to the emergency room as well      as a chest x-ray that showed left lower lobe infiltrate consistent      with pneumonia.  She was admitted  for further evaluation and      treatment and initially placed in ICU due to the extent of her      hypoxia down to 74% at one time.  A CT chest angiogram was      performed to exclude PE and none was identified, however she did      have extensive left lung airspace disease consistent with pneumonia      with associated pleuritic chest pain which was ruled out for      cardiac etiology given her history of atherosclerotic disease but      this fortunately was negative.  The patient has made a remarkable      quick recovery with gradual improvement in her overall pulmonary      symptoms, decreased chest congestion and decreasing need for home      oxygen as stated above.  It is unclear if the patient needs home      oxygen at this time as there are conflicting reports of her O2 sats      which will be re-examined prior to discharge home and home O2 will      be arranged should she qualify.  The patient agrees that she has      done smoking and does not plan to resume and understands her need      to continue an antibiotic course as described here.  She has      received 3 days of IV Rocephin and azithromycin and will be given a      dose of p.o. Avelox prior to discharge home and continue a 7-day      course thereafter.  Close outpatient followup with primary care is      scheduled as above.  The patient is felt medically stable for      discharge home having reached maximal benefit of hospitalization at      this time and is anxious for discharge at this time.  2. Other chronic medical issues.  The patient's other numerous chronic      medical conditions are as listed above and have been stable during      this hospitalization without change.  Over 30 minutes spent on day of discharge with coordination of discharge  planning.      Valerie A. Felicity Coyer, MD  Electronically Signed     VAL/MEDQ  D:  03/13/2008  T:  03/13/2008  Job:  [id number]

## 2010-06-19 NOTE — Op Note (Signed)
[facility]  Patient:    [name] [name], [name] [name]                MRN: [id number] Proc. Date: 09/11/99 Adm. Date:  [id number] Disc. Date: [id number] Attending:  Veneda Melter CC:         Feliciana Rossetti, M.D.  Daisey Must, M.D. [facility]   Operative Report  PREOPERATIVE DIAGNOSES: 1. Peripheral vascular disease. 2. Claudication.  POSTOPERATIVE DIAGNOSES: 1. Peripheral vascular disease. 2. Claudication.  PROCEDURE: 1. Angiography of bilateral iliac arteries. 2. Percutaneous transcutaneous angioplasty of the external right iliac    artery. 3. Percutaneous transcutaneous angioplasty and stenting of the right common    iliac artery.  HISTORY OF PRESENT ILLNESS:  Ms. Vazques is a 77 year old white female with coronary artery disease, renal artery stenosis and peripheral vascular disease who has had worsening right lower extremity claudication.  The patient had lower extremity angiogram performed on August 26, 1999, showing severe narrowing in the right common and external iliac arteries as well as the left common iliac artery.  Ankle brachial indices on July 06, 1999, were 1.02 on the right and 0.85 on the left.  Due to significant symptoms she presents now for percutaneous intervention of the right iliac artery.  SURGEON:  Veneda Melter, M.D.  DESCRIPTION OF PROCEDURE:  Informed consent was obtained from the patient. She was brought to the catheterization at [facility] where both groins were sterilely prepped and draped.  Then 1% lidocaine was used to infiltrate the left groin and a long 7 French sheath was placed in the left femoral artery using modified Seldinger technique.  Using the femoral sheath as well as an end-hole catheter, transstenotic gradient across the left iliac artery was performed as was angiography using manual injections of contrast.  This showed a high grade narrowing of approximately 70% in the left common iliac artery.   Transstenotic gradient was perhaps 10-15 mmHg. There was moderate disease in the external iliac and common femoral artery on the left.  Using an IMA catheter, a Wholey wire was placed across the iliac bifurcation and an end-hole as well as a long femoral sheath were placed across the iliac bifurcation into the right common iliac artery.  The end-hole was advanced into the common femoral artery and pullback gradient as well as angiography performed of the right iliac and common femoral arteries.  This showed an eccentric plaque of 70% in the proximal right iliac artery and a concentric plaque of 70% in the distal section of the right external iliac artery. Transstenotic gradient was at least 25 mmHg in the distal lesion and 10-15 mmHg in the proximal lesion.  We elected to proceed with percutaneous intervention to the right external and common iliac arteries.  The patient was given heparin intravenously to maintain hemostasis at 250 seconds.  With the [facility] wire positioned across the distal lesion, a 6 x 2 POWERFLEX balloon was introduced.  A single inflation was performed at the site of the distal lesion at 8 atmospheres for 80 seconds.  Repeat angiography was then performed showing an excellent result with less than 10% residual narrowing and no evidence of vessel damage.  The 6 x 2 POWERFLEX balloon was then positioned in the proximal right iliac artery and used to predilate the lesion at 10 atmospheres for 60 seconds.  Repeat angiography showed persistence of severe stenosis and it was felt that stenting of this vessel would be necessary.  A [id number] stent was then hand crimped on  a 8 x 3 POWERFLEX balloon and carefully positioned under cine angiography in the proximal right iliac artery.  This was then primarily deployed at 10 atmospheres for 60 seconds.  Repeat angiography was performed in various projections showing an excellent result with less than 10% residual narrowing and  appropriate positioning of the stent for expansion to the vessel wall.  There was no evidence of vessel damage. The end-hole catheter was reintroduced and transstenotic gradient performed across the two lesions.  This was now 0 mmHg.  Final angiography was performed showing no vessel damage and brisk flow through the right lower extremity. The guide catheter was then pulled back across the iliac bifurcation and the patient transferred to the holding area where the sheath was removed when the ACT returned to normal.  The patient tolerated the procedure well and was transferred to the floor in stable condition.  FINAL RESULT: 1. Successful percutaneous transluminal angioplasty of the right external    iliac artery with reduction of 70% narrowing to less than 10%. 2. Successful percutaneous transluminal angioplasty and stenting of the    proximal right iliac reduction with reduction of 70% narrowing to less than    10% with placement of a [id number] stent dilated to 8 mm. DD:  09/11/99 TD:  09/13/99 Job: [id number]

## 2010-06-19 NOTE — Assessment & Plan Note (Signed)
[facility]                              CARDIOLOGY OFFICE NOTE   NAME:Rohlman, SIMA LINDENBERGER                MRN:          [id number]  DATE:08/12/2005                            DOB:          1927-11-12    CARDIOLOGY OFFICE NOTE   HISTORY OF PRESENT ILLNESS:  Mrs. Kassin is a 77 year old woman with  hypertension, previous nonobstructive coronary disease and preserved left  ventricular systolic function.  She has underwent cardiac catheterization in  December 2005 demonstrating patent stents in right coronary artery and  nonobstructive disease in the left system.  She denies any chest pain or  exertional dyspnea.  She does have a chronic cough.  She says she has been  troubled over the past year by some night sweats and leg cramps.  These have  been evaluated by Dr. Drue Novel.  He is also evaluating a slightly elevated white  count with a normal automated differential.  She has a number of questions  about this.  I discussed it with him today and relayed his reassurances and  plans for further evaluation.   She has not had any syncope, pre-syncope or chest discomfort.   CURRENT MEDICATIONS:  1.  Altace 10 mg per day.  2.  Norvasc 10 mg per day.  3.  Lipitor 40 mg per day.  4.  Aspirin 81 mg per day.  5.  Atenolol 50 mg per day.  6.  Imdur 30 mg per day.   PHYSICAL EXAMINATION:  GENERAL:  She is generally well-appearing in no  distress.  VITAL SIGNS:  Heart rate of 61.  Blood pressure 130/74.  Weight of 119  pounds.  NECK:  She has no jugular venous distention.  No thyromegaly.  LUNGS:  Clear to auscultation.  HEART:  She has nondisplaced point of maximal cardiac impulse.  There is a  regular rate and rhythm without murmur, rub, or gallop.  ABDOMEN:  Soft, nondistended, nontender.  There is no hepatosplenomegaly.  Bowel sounds are normal. Carotid pulses 2+ bilaterally with soft bruit on  the left.   IMPRESSION/RECOMMENDATIONS:  1.  Coronary  disease:  Remains asymptomatic.  Continue secondary prevention      with aspirin, ACE inhibitor, beta blocker and statin.  Despite the      absence of symptoms, electrocardiogram suggests interval anteroseptal      myocardial infraction.  Will check echocardiogram to assess the function      of her anterior wall.  2.  Hypercholesterolemia:  Followed by Dr. Drue Novel.  Goal LDL less than 70.  3.  Spinal stenosis:  Followed by Dr. Drue Novel and Ethelene Hal.  4.  Carotid stenosis:  Asymptomatic, mild disease most recently assessed in      February 2007.  Due for repeat in February 2008.  5.  Palpitations:  Appear to be due to her unifocal premature ventricular      contractions.  6.  Renal artery stenosis:  Status post stenting on the left.  Renal      function remains normal with creatinine of 1.  Blood pressure well-      controlled.  There is  a 60% stenosis on the untreated right.   I will plan on seeing her back in 18 months unless the echocardiogram shows  something concerning.                                 Salvadore Farber, MD    WED/MedQ  DD:  08/12/2005  DT:  08/12/2005  Job #:  [id number]   cc:   Willow Ora, MD

## 2010-06-19 NOTE — Discharge Summary (Signed)
[place]  Patient:    [name] [name], [name] [name]                MRN: [id number] Adm. Date:  [id number] Disc. Date: 06/18/99 Attending:  Lewayne Bunting Dictator:   Tereso Newcomer, P.A. CC:         Rudene Christians. Ladona Ridgel, M.D. [facility]             Feliciana Rossetti, M.D.                  Referring Physician Discharge [facility]  DATE OF BIRTH:  [birth year]  DISCHARGE DIAGNOSES:  1. Coronary artery disease.  2. Status post percutaneous transluminal coronary angioplasty/stent to     proximal right coronary artery for reduction of stenosis from 75 to 0%.  3. Coronary angiography on Jun 17, 1999 revealing left main - normal; left     anterior descending artery - mid 40%; diagonal - large ostial 50%; left     circumflex - 20%; large third obtuse marginal; right coronary artery -     proximal 25%/75%; normal left ventricular ejection fraction.  4. Peripheral vascular disease.  Angiography from cardiac catheterization on     Jun 17, 1999 revealing right renal artery stenosis - 70%; left renal     artery stenosis - 90%; mild plaque - infrarenal; right common iliac     artery stenosis - about 50%; left common iliac artery stenosis - about     70%, then 50% left external iliac artery stenosis.  5. Tobacco abuse.  6. Hyperlipidemia.  7. Hypertension.  8. History of breast cancer.  9. History of multiple skin cancers. 10. History of spinal stenosis.  ADMISSION HISTORY:  This 77 year old white female with no known history of CAD presented to her primary care physician on the date of admission complaining of two to four weeks of substernal chest pressure.  The pain would occur in the morning with awakening, as well as with certain types of exertional activities, like working in the garden.  The duration of her pain would be any where from less than a minute up to 10 minutes.  It was always relieved with rest.  The chest pain was associated with nausea and palpitations, but  no shortness of breath or diaphoresis.  She denies the pain worsening in intensity or frequency.  She also noted the pain was sometimes associated with meals.  Her primary care physician sent her to the emergency room.  In the emergency room, her initial troponin I was positive.  ADMISSION PHYSICAL EXAMINATION:  GENERAL:  Revealed a well-nourished, well-developed female in no acute distress.  VITAL SIGNS:  Blood pressure 204/98, pulse 63.  CARDIAC:  Regular rate and rhythm, no murmurs, positive S4.  LUNGS:  Scattered rales.  EXTREMITIES:  Without edema.  LABORATORY DATA:  EKG normal sinus rhythm with a questionable anteroseptal MI. Nonspecific ST-T wave changes.  Initial labs - Hemoglobin 12.7, hematocrit 36.5, wbc 8.4, platelet count 241. Sodium 139, potassium 3.0, chloride 104, CO2 30, BUN 15, creatinine 0.7, glucose 93.  Chest x-ray - No acute disease, upper normal heart size.  HOSPITAL COURSE:  Patient was admitted for rule out MI with serial enzymes and EKG.  She was originally placed on Altace in addition to her beta blocker for blood pressure control.  She was placed on nitroglycerin drip and heparin, given her positive troponin I.  Follow-up labs revealed total CK #2 was 131, CK-MB 2.2, troponin I 0.22;  total CK #3 93, CK-MB 1.2.  A lipid profile came back revealing total cholesterol of 193, triglycerides 100, HDL 51, and LDL 122.  Potassium was replaced.  The patient remained stable and went to the cardiac catheterization lab on Jun 17, 1999.  She had no immediate complications.  The results are noted above in the discharge diagnoses. Patient remained stable on the morning of Jun 18, 1999.  Bruit was noted in the right groin, but no hematoma was noted.  This bruit was felt to be related to her peripheral vascular disease and was decided her groin was stable. Patient was ambulated by cardiac rehab and did well.  It was felt she was stable enough for discharge to  home.  DISCHARGE MEDICATIONS: 1. Atenolol 50 mg q.d. 2. Plavix 75 mg q.d. for four weeks. 3. Norvasc 5 mg q.d. 4. Enteric-coated aspirin 325 mg q.d. 5. Nitroglycerin 0.4 mg sublingual p.r.n. chest pain.  ACTIVITY:  No driving, heavy lifting, or heavy exertion for three days.  DIET:  Low fat/low cholesterol/low sodium diet.  WOUND CARE:  Patient is to watch her groin for any increased bleeding, swelling, or bruising and call our office with concerns.  SPECIAL INSTRUCTIONS:  She has been counseled on smoking cessation.  If she decides to use something like Wellbutrin, she should contact either her primary care physician or our office.  FOLLOW-UP:  With Dr. Chales Abrahams in two weeks.  Our office will call her with an appointment.  She will need to have appointment set up for elective stenting to her renal arteries, given her renal artery disease.  After she has been seen by Dr. Chales Abrahams, future follow-up will be with Dr. Gerri Spore. DD:  06/18/99 TD:  06/18/99 Job: [id number]

## 2010-06-19 NOTE — Cardiovascular Report (Signed)
[name]. [facility]  Patient:    [name] [name], [name] [name]                MRN: [id number] Proc. Date: 06/17/99 Adm. Date:  [id number] Attending:  Lewayne [name] CC:         [name] [name], M.D.             [name] [name], M.D. [facility]             Cardiac Catheterization Lab                        Cardiac Catheterization  PROCEDURES PERFORMED: 1. Left heart catheterization. 2. Coronary angiography. 3. Left ventriculography. 4. Abdominal aortography. 5. PTCA with stent placement in the proximal right coronary artery.  INDICATIONS:  [name] [name] is a 77 year old woman who is admitted with unstable angina and mildly elevated Troponin.  She was referred for cardiac catheterization.  CATHETERIZATION PROCEDURAL NOTE:  A 6-French sheath was placed in the right femoral artery.  Standard Judkins 6-French catheters were utilized.  CONTRAST:  Omnipaque.  COMPLICATIONS:  None.  CATHETERIZATION RESULTS:  HEMODYNAMICS: 1. Left ventricular pressure:  194/20. 2. Aortic pressure:  180/66.  There was no aortic valve gradient.  LEFT VENTRICULOGRAM:  Wall motion is normal.  Ejection fraction greater than 65%.  ABDOMINAL AORTOGRAPHY:  There is a 70% stenosis in the right renal artery, 90% stenosis in the left renal artery.  There is mild plaquing of a distal infrarenal abdominal aorta.  The right common iliac is diffusely diseased, with approximately 50% stenosis.  The left common iliac has an area of approximately 70% stenosis, followed by a long 50% stenosis in the external iliac artery.  CORONARY ARTERIOGRAPHY: (Right dominant). 1. Left Main:  Normal. 2. Left Anterior Descending:  Has diffuse 40% stenosis in the mid vessel.    There is a large first diagonal with a 50% stenosis at its origin.  There    is a small second diagonal. 3. Left Circumflex:  Gives rise to a small OM1,    small OM2 and a large OM3.  A third marginal branch has a 40% stenosis. 4. Right  Coronary Artery:  Gives rise to a normal-sized posterior descending    artery and a normal-sized posterolateral branch.  The proximal right    coronary has a diffuse 25% stenosis, followed by a 75% stenosis with haze.  IMPRESSIONS: 1. Normal left ventricular systolic function. 2. Significant bilateral renal artery stenosis, greater on the left than the    right.  With moderate peripheral vascular disease. 3. One-vessel coronary artery disease.  The culprit vessel appears to be the    right coronary artery.  PLAN:  Percutaneous intervention of the right coronary artery.  See below.  PTCA PROCEDURAL NOTE:  Following completion of the diagnostic catheterization we proceeded directly with coronary intervention.  We exchanged the 6-French sheath for a 7-French sheath.  Heparin and Integrilin were administered per protocol.  A 7-French JR4 guiding catheter with side holes and a BMW wire were utilized.  Predilatation of the lesion was performed with a 2.5 x 15 mm Quantam Ranger balloon inflated to 12 atm.  We then deployed a 3.0 x 18 mm NIR Elite stent at 8 atm.  We used a 3.0 x 15 mm Quantam Ranger balloon for post-dilatation of the stent.  This was inflated to 14 atm in both the distal and proximal margins of the stent.  The patient did  have significant chest discomfort after post-dilatation of the stent.  With intracoronary administration of nitroglycerin and Verapamil her chest discomfort eventually resolved.  TIMI-3 flow was maintained in the distal vessel.  ANGIOGRAPHY RESULTS:  Revealed an excellent result with 0% residual stenosis at the stent site.  COMPLICATIONS:  None.  FINAL RESULTS:  Successful PTCA and stent placement in the proximal right coronary artery.  A 75% stenosis with haze was reduced to 0% residual and TIMI-3 flow.  Of note, there was a large right ventricular branch arising within the stented portion of the vessel.  The ostium of this branch was compromised by the  stent; however, there was overall patency and flow maintained on that side branch.  PLAN:  Integrilin will be continued for 18 hours.  Plavix will be administered for four weeks.  The patient will need follow-up regarding her renal artery stenosis for possible percutaneous intervention. DD:  06/17/99 TD:  06/19/99 Job: [id number]

## 2010-06-19 NOTE — Discharge Summary (Signed)
NAME:  [name] [name], [name] [name]                   ACCOUNT NO.:  [id number]   MEDICAL RECORD NO.:  [id number]                   PATIENT TYPE:  INP   LOCATION:  3742                                 FACILITY:  [facility]   PHYSICIAN:  Veneda Melter, M.D.                   DATE OF BIRTH:  1927-07-15   DATE OF ADMISSION:  04/08/2003  DATE OF DISCHARGE:  04/09/2003                           DISCHARGE SUMMARY - REFERRING   DISCHARGE DIAGNOSES:  1. Chest pain.  2. Known coronary artery disease.  3. Hypertension.  4. Dyslipidemia, treated.  5. Peripheral vascular disease, history of left renal artery stent, right     external artery stent, status post right superficial femoral artery     percutaneous transluminal angioplasty.  6. Status post lumbar laminectomy.   HOSPITAL COURSE:  Ms. Top is a 77 year old lady with peripheral  vascular disease who, over the week prior to admission, had suffered  substernal chest pressure like a strap around her chest.  It was felt that  she needed to be admitted to [facility] for cardiac catheterization.  The  catheterization took place on April 09, 2003 and essentially her previous  right coronary artery stent had 40% to 50% in-stent restenosis but was not  felt to be the source of her chest pain.  An aortogram was performed and the  left renal artery stent had 40% in-stent restenosis.  There was a right 60%  stenosis.  Iliacs had a 70% stenosis on each side of the stent and there was  a 60% right stenosis.  The plan was for the patient to follow up in the  office with a Cardiolite as well as a ABIs.  The patient was kept overnight  and the cardiac enzymes were negative.   DISCHARGE MEDICATIONS:  She was discharged to home in stable condition on  the following medications which consisted of her current admission  medications:  1. Altace 10 mg a day.  2. Atenolol 50 mg a day.  3. Norvasc 10 mg a day.  4. Imdur 30 mg a day.  5. Baby aspirin daily.  6.  Lipitor 40 mg a day.  7. Hydrocodone p.r.n.  8. Sublingual nitroglycerin p.r.n.   ACTIVITY:  No strenuous activity or driving for 2 days, then increase  activity.   DIET:  Remain on a low-fat diet.   WOUND CARE:  Clean over cath site with soap and water.   SPECIAL DISCHARGE INSTRUCTIONS:  Call for questions or concerns.   FOLLOWUP:  Follow up at the office on April 17, 2003 at 12:15 p.m. for  stress test and follow up with Dr. Veneda Melter the following week on April 25, 2003 at 11:45 a.m.      Guy Franco, P.A. [facility]                      Veneda Melter, M.D.    LB/MEDQ  D:  04/28/2003  T:  04/29/2003  Job:  [id number]

## 2010-06-19 NOTE — Discharge Summary (Signed)
[place]. [facility]  Patient:    [name] [name], [name] [name]                MRN: [id number] Adm. Date:  [id number] Disc. Date: [id number] Attending:  Veneda Melter Dictator:   Annett Fabian, P.A. CC:         Lacretia Leigh. Quintella Reichert, M.D.  Veneda Melter, M.D. [facility]   Discharge Summary  DISCHARGE DIAGNOSES: 1. Coronary artery disease. 2. Peripheral vascular disease. 3. Renal artery stenosis. 4. Hypertension. 5. History breast carcinoma. 6. History of spinal stenosis. 7. Hyperlipidemia.  HISTORY OF PRESENT ILLNESS:  The patient presented to the emergency room complaining of severe chest pain radiating to the arm.  There is no accompanying nausea, diaphoresis, shortness of breath or lightheadedness.  The patient was unrelieved by nitroglycerin prehospital; however, it was relieved by sublingual nitroglycerin in the emergency room.  She was seen and admitted by Dr. Loraine Leriche Pulsipher.  Admitting diagnosis was rule out MI.  He started the patient on aspirin, heparin, Integrilin, nitroglycerin and a beta blocker.  We planned to catheterize the patient on Monday or sooner if pain returns.  HOSPITAL COURSE:  The following day, the patient reported no chest pain from early in the morning.  There was no shortness of breath.  It was noted that while her CK and MB were relatively normal, her troponin I was elevated at 0.26.  She was seen by Dr. Jens Som.  His plan was to continue the current medications and the catheterization on Monday.  The following day, the patient was again seen by Dr. Jens Som.  She reported some very brief one to two minute episode of chest pain.  However, she was pain free at the time of interview.  The plan at that time was still to continue the catheterization on Monday or sooner if she became unstable.  The following day, the patient was taken to the catheterization lab by Dr. Chales Abrahams.  He found mild disease in the left main coronary artery.  In  the first diagonal off the LAD, there was proximal 50% lesion.  In the mid portion of the LAD, there is a 30-40% lesion.  In the circumflex, there was a 30% lesion in the OM-2.  Right coronary artery was dominant and there was a 95% InStent restenosis in the mid portion of the vessel.  There was no mitral regurgitation.  Ejection fraction was estimated to be greater than 55%.  We performed a percutaneous transluminal coronary angioplasty and stenting of the right mid coronary artery.  The InStent stenosis was reduced from 90% to less than 10%.  The mid right coronary artery was further covered with a single stent.  He also noted that there was moderate diffuse disease in the iliac arteries as well as the renal arteries.  The following day, the patient was again seen by Dr. Chales Abrahams.  She was complaining more of low back pain.  There was no chest pain or shortness of breath.  On March 15, 2000, the patient was seen by orthopedics to evaluate her low back pain radiating down into her right lower extremity.  It was note that she has been seen prior to Dr. Ronnell Guadalajara with epidural steroid injections.  It was felt that she had probable early spinal stenosis. However, it was felt that it was best followed up by Dr. Montez Morita in the office where she could have plain films done.  It was explained to her that she may be a candidate  for epidural steroids; however, Dr. Montez Morita did need to evaluate her.  She would also have to be off of anticoagulants at the time of injection.  The following day, the patient reported no chest pain or shortness of breath. She also reported no back pain at the time of exam.  Dr. Chales Abrahams felt she was stable for discharge.  DISCHARGE MEDICATIONS: 1. Enteric coated aspirin 325 mg q.d. 2. Tenoretic 50/25 q.d. 3. Lipitor 10 mg q.d. 4. Plavix 75 mg q.d. x 4 weeks. 5. Darvocet-N 100 one tablet q.6h. p.r.n.  SPECIAL INSTRUCTIONS:  The patient his to contact Dr. Quintella Reichert or  Dr. Montez Morita for further refills.  Watch catheterization site for pain, bleeding or swelling and to call the [place] office with any of these problems.  She was advised to stop smoking.  ACTIVITY:  Avoid driving, heavy lifting or tub baths until March 17, 2000.  DIET:  Low fat, low cholesterol diet.  FOLLOWUP:  Follow up with Dr. Quintella Reichert as needed or scheduled.  Follow up with Dr. Montez Morita as needed or scheduled.  Follow up with Dr. Chales Abrahams or P.A. in approximately four weeks and to call the [place] office for appointment.  LABORATORY DATA AND X-RAY FINDINGS:  White count 6.4, hemoglobin 11.4, hematocrit 33.7, platelet 158.  Sodium 140, potassium 3.9, chloride 106, CO2 26, BUN 14, creatinine 0.9, glucose 88.  Last set of cardiac enzymes revealed CK 138, MB 5.6, troponin 0.26.  Total cholesterol 109, triglycerides 59, HDL 38, total cholesterol to HDL ratio of 2.5 and LDL 59.  Chest x-ray revealed some old granulomatous disease in the left chest, but there are no acute abnormalities.  Electrocardiogram revealed sinus bradycardia at approximately 54.  No evidence of septal infarct as well as QS noted in leads V1 and V2, QRS 0.084, QTC 0.434, axis 43. DD:  04/14/00 TD:  04/14/00 Job: [id number]

## 2010-06-19 NOTE — Consult Note (Signed)
[place]. [facility]  Patient:    [name] [name], [name] [name]                MRN: [id number] Adm. Date:  [id number] Attending:  Veneda Melter                          Consultation Report  HISTORY OF PRESENT ILLNESS:  I was called to see [name] [name] today on March 15, 2000, because of pain in her lower back radiating into her right lower extremity.  She said this pain is present only when she is up ambulating.  When she is lying down in bed and sitting, she has no pain.  She denies any recent trauma.  She has recently had stents inserted in her coronary arteries.  PAST MEDICAL HISTORY:  She has a history of having epidural steroids in the past by my associate, Philips J. Montez Morita, M.D.  She has a long past history, and that is all well-documented.  PHYSICAL EXAMINATION:  From the orthopedic standpoint, her back shows a painful limited range of motion of her back because of her pain.  She has a slightly positive straight leg raising test on the right.  Her hip exams are normal.  Knee exams are fine.  Her muscle testing exam in the lower extremities is intact.  The sensory exam in her lower extremities is intact.  IMPRESSION:  Probable early spinal stenosis.  No special x-rays were ordered today.  She can wait and come to the office and have her plain films done there and evaluated by Dr. Montez Morita.  I explained to her she may be a candidate for epidural steroids again, but she is going to need an office evaluation by Dr. Montez Morita with x-rays, etc., and she will obviously have to be off anticoagulants at that time if any epidural steroids will be repeated.  She should really be discharged home on pain medications by her private doctor, and she will see Dr. Montez Morita as instructed. DD:  03/15/00 TD:  03/16/00 Job: [id number]

## 2010-06-19 NOTE — Procedures (Signed)
[place]. [facility]  Patient:    [name] [name], [name] [name]                MRN: [id number] Proc. Date: 07/28/00 Adm. Date:  [id number] Attending:  Veneda Melter CC:         [name], M.D., [facility]   Procedure Report  PROCEDURES PERFORMED: 1. Selective angiography. 2. Percutaneous transluminal coronary angioplasty and stenting of the mid    right coronary artery.  DIAGNOSES: 1. Coronary artery disease with in-stent restenosis. 2. Unstable angina.  HISTORY:  [name] is a 77 year old white female with advanced coronary artery disease who has undergone percutaneous intervention with the stenting of the mid right coronary artery in the past.  The patient has had recurrence of chest discomfort and presents now for further assessment.  She underwent cardiac catheterization by Dr. Andee Lineman showing severe narrowing of 80% in the mid right coronary artery within the area of previous stenting and this, with in-stent restenosis as well as diffuse disease elsewhere in the mid RCA.  She is referred for percutaneous intervention.  DESCRIPTION OF PROCEDURE:  Informed consent was obtained.  The patient was brought to the catheterization lab.  The existing 6-French sheath was exchanged for a 7-French sheath.  The patient was given heparin and Integrilin on a weight-adjusted basis to maintain an ACT of greater than 225 seconds.  A 7-French JR4 guide catheter with sideholes was used to engage the right coronary artery.  A 0.014-inch Patriot wire was advanced in the distal RCA.  A 3.25- x 15-mm cutting balloon was introduced, and a total of six inflations were performed at 10 atmospheres for 30 seconds, and a single inflation of six atmospheres for 30 seconds along the entire course of the mid right coronary artery within the area of stenting.  Repeat angiography showed significant improvement in vessel lumen with only mild residual narrowing in the mid section of  the area of stenting; however, there was evidence of proximal and distal edge dissections.  These were not flow-compromising; however, felt that further stabilization was necessary.  A 3.0- x 12-mm NIR Elite stent was introduced and used to cover the distal edge dissection.  This was deployed at 12 atmospheres for 30 seconds.  The stent delivery system was brought back slightly and used to further dilate the junction of the stents at 14 atmospheres for 30 seconds.  A second 3.0- x 12-mm NIR Elite stent was introduced and placed just proximal to the stented area in the mid section of the right coronary artery and used to cover the proximal edge tear.  This was deployed at 12 atmospheres for 30 seconds.  The stent delivery balloon was then advanced slightly and used to dilate the junction of the stents at 16 atmospheres for 30 seconds.  Two inflations were then performed within the mid RCA within the area of previous stenting at 18 atmospheres for 30 seconds. Repeat angiography was then performed after the administration of intracoronary nitroglycerin showing an excellent result with approximately 5-10% residual narrowing.  There was no uncovered area of vessel damage and TIMI-3 flow through the right coronary artery.  There was no distal vessel damage.  The patient had chest discomfort with balloon inflations which had resolved at the end of the case.  The guide catheter was then removed, and the sheaths were secured into position.  The patient tolerated the procedure well and was transferred to the floor in stable condition.  FINAL RESULT:  Successful  reduction of 80% in-stent restenosis to less than 10% with 3.25-mm cutting balloon and coverage of proximal distal edge dissections with two 3.0- x 12-mm NIR Elite stents. DD:  07/28/00 TD:  07/28/00 Job: [id number]

## 2010-06-19 NOTE — Discharge Summary (Signed)
[place]. [facility]  Patient:    [name] [name], [name] [name] Visit Number: [id number] MRN: [id number]          Service Type: CAT Location: 3700 3713 01 Attending Physician:  Veneda Melter Dictated by:   Lavella Hammock, P.A. Admit Date:  12/21/2000 Discharge Date: 12/22/2000   CC:         [name], M.D. [facility]   Referring Physician Discharge [facility]  DATE OF BIRTH:  09/19/2027  PROCEDURES: 1. Cardiac catheterization. 2. Coronary arteriogram. 3. Left ventriculogram. 4. Percutaneous transluminal coronary angioplasty of one vessel with a cutting    balloon.  HISTORY OF PRESENT ILLNESS:  Mrs. Stallworth is a 77 year old female with a history of coronary artery disease who has had previous percutaneous interventions to the RCA and also has a history of peripheral vascular disease.  She was evaluated in the office for recurrent chest pain and scheduled for a cardiac catheterization.  She was admitted for this on December 21, 2000.  HOSPITAL COURSE:  The cardiac catheterization showed a normal left main, a 30% LAD, and a 50% first diagonal.  The circumflex had a 30% stenosis at the bifurcation of the OM1.  The RCA had 70% then 30% then 95% end-stent restenosis.  There was a 30% distal stenosis.  Her EF was greater than 70%. She had PTCA to the RCA for end-stent restenosis with a 3.0 cutting balloon. The stenosis was reduced to less than 20%.  With each inflation of the balloon, she had chest pain and EKG changes.  She was kept overnight.  She had some chest pain overnight that was relieved with a nitroglycerin at 3 cc.  She has no problems with her blood pressure.  She had no further episodes of chest pain.  The next day, she had minimal elevation in her CK-MBs, but was rechecked and was stable.  She had no further chest pain.  She was seen by cardiac rehabilitation and had no pain with ambulation and was able to walk 450 feet.  The groin was stable without  hematoma or ecchymosis.  There was a bruit bilaterally and it was felt that the bruit was old and not secondary to the procedure.  He was considered stable for discharge on December 22, 2000.  LABORATORY VALUES:  Hemoglobin 11.9, hematocrit 35.6, WBC 7.1, platelets 164. Sodium 140, potassium 3.6, chloride 109, CO2 27, BUN 21, creatinine 0.9, glucose 107.  Post procedure CK-MB 83/5.9 with a repeat of 95/5.7.  DISCHARGE CONDITION:  Improved.  CONSULTS:  None.  COMPLICATIONS:  None.  DISCHARGE DIAGNOSES: 1. Coronary artery disease, status post percutaneous transluminal coronary    angioplasty with a cutting balloon to the right coronary artery this    admission. 2. Status post percutaneous transluminal coronary angioplasty and stent to the    right coronary artery in June of 2002. 3. Status post percutaneous coronary intervention to the renal arteries    secondary to renal artery stenosis. 4. Hypertension. 5. Atherosclerotic peripheral vascular disease. 6. Hyperlipidemia. 7. Preserved left ventricular function.  ACTIVITY:  Her activity level is to include no driving or sexual or strenuous activity for two days.  DIET:  She is to stick to a low-fat diet.  WOUND CARE:  She is to call the office for problems with the catheterization site.  FOLLOW-UP:  She has a follow-up appointment with Veneda Melter, M.D., on January 09, 2001, at 9:45 a.m.  She is to follow up with [name], M.D., as scheduled.  DISCHARGE MEDICATIONS: 1. Tenormin 50 mg q.d. 2. Altace 10 mg q.d. 3. Norvasc 5 mg q.d. 4. Imdur 30 mg p.o. q.d. 5. Aspirin 325 mg p.o. q.d. 6. Lipitor 10 mg p.o. q.d. 7. Nitroglycerin 0.4 mg sublingual p.r.n. Dictated by:   Lavella Hammock, P.A. Attending Physician:  Veneda Melter DD:  12/22/00 TD:  12/22/00 Job: [id number]

## 2010-06-19 NOTE — Discharge Summary (Signed)
[facility]  Patient:    [name] [name], [name] [name]                MRN: [id number] Adm. Date:  08/26/99 Disc. Date: 08/27/99 Attending:  Veneda Melter, M.D.                           Discharge Summary  PROCEDURES:  Peripheral angiography/stent left renal artery, August 26, 1999.  REASON FOR ADMISSION:  The patient is a 77 year old female, with CAD, who was found to have severe bilateral RAS and iliac artery stenosis by recent cardiac catheterization.  She was admitted for peripheral angiography and possible percutaneous intervention.  Please refer to dictated admission note for full details.  PREADMISSION LABORATORY DATA:  INR 0.8, metabolic profile normal.  CBC normal.  HOSPITAL COURSE:  Peripheral angiography was performed on August 26, 1999 by Dr. Chales Abrahams (See report for full details), revealing 90% left renal artery stenosis and greater than 70% left iliac and right CFA stenosis.  Dr. Chales Abrahams proceeded with PTA/stenting of the 90% left renal artery lesion to a 10% residual stenosis.  Plans are for patient to return for staged intervention of the left iliac and right CFA lesion in 2-4 weeks.  The patient was placed on Plavix x one month.  In postprocedure follow-up, the patient was found to have a loud right groin bruit, a contralateral bruit was noted.  Plans prior to discharge were to proceed with duplex scanning of the right groin to rule out a pseudoaneurysm or AV fistula.  If this proved unrevealing, patient was to be discharged home.  DISCHARGE MEDICATIONS:  Aspirin 325 mg q.d., Plavix 75 mg q.d. (x 1 month), Tenoretic 50/2.5 mg q.d., Norvasc mg q.d.  DISCHARGE DIAGNOSES: 1. Peripheral vascular disease.    a. Percutaneous transluminal angioplasty/stent left renal artery August 26, 1999.    b. Residual 70% left iliac/80% right common femoral artery stenoses -       staged intervention in 2-4 weeks. 2. Coronary artery disease.    a. Stent right  coronary artery May 2001.    b. Normal left ventricular function. 3. Hypertension. 4. Tobacco. DD:  08/27/99 TD:  08/28/99 Job: [id number]

## 2010-06-19 NOTE — H&P (Signed)
NAME:  MARCHE, HOTTENSTEIN NO.:  [id number]   MEDICAL RECORD NO.:  [id number]          PATIENT TYPE:  INP   LOCATION:  1824                         FACILITY:  [facility]   PHYSICIAN:  Learta Codding, M.D. LHCDATE OF BIRTH:  [birth year]   DATE OF ADMISSION:  12/30/2003  DATE OF DISCHARGE:                                HISTORY & PHYSICAL   PRIMARY CARE PHYSICIAN:  Wanda Plump, MD [facility].  Primary cardiologist in the  past was Veneda Melter, M.D.   CHIEF COMPLAINT:  Chest discomfort.   HISTORY OF PRESENT ILLNESS:  Onset of chest pressure started this past  Saturday.  The patient describes it as pressure with heaviness in the  midsternal area, localized around the breast, gradually started having  shooting pains up bilateral neck and with an earache.  She states the pain  is intermittent, works with exertion, and is a fleeting discomfort difficult  to measure amount of time.  She rates it a 7 to 8 out of 10.  She currently  is pain-free.  She denies any associated symptoms such as shortness of  breath, clamminess, or diaphoresis.  She does complain of feeling cold when  she has the discomfort.  Today, she continued to have the discomfort and  took a nitroglycerin with relief.  She called our office and was told to  come to the emergency room.   PAST MEDICAL HISTORY:  Extensive for vascular disease.  She has been  catheterized numerous times.  Last cardiac catheterization was in March of  this year.  No history of bypass.  LVEF is 70% by catheterization in March.   1.  Coronary artery disease, x1 vessel disease.  She has a stent to the      right coronary artery.  2.  Peripheral vascular disease.  She has a left renal stent and a right      iliac external artery stent.  3.  She has spinal stenosis and has had a laminectomy for that.  4.  She has had cataract surgery and been treated for breast cancer.   SOCIAL HISTORY:  She currently lives in [place] alone.  She is  retired.  She is also a widow.  She has a daughter that is deceased from cancer.  She  continues to smoke one to two packs a day as she has for many years.  Exercise includes house work, yard work, and walking her dog.  EtOH; she has  a vodka with orange juice x2 glasses just about everyday per patient.   DIET:  No-added-salt.  She denies any drug or herbal medication use.   FAMILY HISTORY:  Not really clear.  Mother is deceased, but the patient does  not know why.  Father is deceased from tuberculosis, the patient does not  know how old he was.  She has a sister who was murdered at a younger age.  She states she has had half-brothers, but she does not know their health  status.   ALLERGIES:  TEQUIN causes anaphylactic reaction and OXYCODONE makes her  extremely sick.   MEDICATIONS:  Evidently, the patient was on  Norvasc which is currently  discontinued.  1.  Verapamil 125 mg for about the last two weeks.  2.  She takes Lorcet p.r.n. for her back.  3.  She is on Altace 10 mg daily.  4.  IMDUR 30 mg.  5.  Aspirin 81 mg.  6.  Lipitor 40 mg.  7.  Nitroglycerin p.r.n.   REVIEW OF SYSTEMS:  Positive for chills.  HEENT:  Positive for sinus  tenderness and nasal discharge.  CARDIOPULMONARY:  Positive for chest pain,  occasional palpitations, coughing, and wheezing.  NEUROPSYCHIC:  Positive  for depression, situational related to death of daughter, although, is not a  current event.  MUSCULOSKELETAL:  Arthralgia, pain in her back.  GASTROINTESTINAL:  Complains of bowel changes, constipation with pain  medication.   PHYSICAL EXAMINATION:  VITAL SIGNS:  Temperature 97, pulse 76, respirations  20, blood pressure 164/76.  The patient is saturating 97% on room air.  GENERAL:  She is alert and oriented and in no acute distress.  Pupils equal,  round, and reactive.  Dentition; the patient has permanent implants for  teeth.  NECK:  Supple without lymphadenopathy, negative for JVD.  HEART:   Heart rate regular, S1 and S2.  The patient has a positive right and  left femoral bruit.  LUNGS:  She has expiratory coarse wheezes throughout with rales in the  bilateral upper lobes.  ABDOMEN:  Soft and nontender with positive bowel sounds.  EXTREMITIES:  No cyanosis, clubbing, or edema.  However, feet are very cool  to touch and pulses are difficult to palpate.   EKG showing a rate of 63, sinus rhythm, interval PR 150, QRS 88, QTC 435.   LABORATORY DATA:  Pending.   PROBLEM LIST:  The patient is seen and examined by Learta Codding, M.D. [facility]   Problem 1.  Symptomatic chest pain, rule out unstable angina.  Currently  EKG's without change.  Cardiac enzymes pending.   Problem 2.  Coronary artery disease history.  Status post stent to the RCA.  40 to 50% in-stent restenosis.   Problem 3.  PVD.  Left renal artery stent with 40% in-stent restenosis.  Right 60% stenosis.  Status post right superficial femoral artery  percutaneous transluminal angioplasty with a 70% stenosis on each side of  the stent and a 60% right stenosis.  Bilateral femoral bruits.  Questionable  soft bruit on left carotid documented in July of 2005 at our office,  questionable whether or not the patient had a ultrasound at that time.  Will  follow up and try to find the results.   Problem 4.  Spinal stenosis with chronic pain medication.   Problem 5.  Right hip pain secondary to #4.   Problem 6.  COPD.   Problem 7.  Tobacco use.   Problem 8.  Status post left renal artery stent.   Problem 9.  Daily EtOH consumption.   PLAN:  Admit this patient to telemetry unit, cycle cardiac enzymes.  Cardiac  catheterization tomorrow.  We will continue aspirin and increase the dose to  325 mg.  Plavix, we will load the patient with 150 mg and then 75 mg daily.  We will initiate IV heparin per pharmacy protocol.  We will use nitroglycerin paste for chest discomfort.  We will also treat the patient  prophylactically with  Ativan for EtOH and tobacco side effects.      [place]   MB/MEDQ  D:  12/30/2003  T:  12/30/2003  Job:  [id number]

## 2010-06-19 NOTE — Discharge Summary (Signed)
NAME:  SAMANI, DEAL         ACCOUNT NO.:  [id number]   MEDICAL RECORD NO.:  [id number]          PATIENT TYPE:  INP   LOCATION:  3709                         FACILITY:  [facility]   PHYSICIAN:  Learta Codding, M.D. LHCDATE OF BIRTH:  November 03, 1927   DATE OF ADMISSION:  12/30/2003  DATE OF DISCHARGE:  01/01/2004                                 DISCHARGE SUMMARY   PROCEDURES:  1.  Cardiac catheterization.  2.  Coronary arteriogram.  3.  Left ventriculogram.   HOSPITAL COURSE:  Ms. Alvi is a 77 year old female with a history of  coronary artery disease.  She had a stent to the RCA and a history of  peripheral vascular disease with history of stents to the left renal artery  and right external iliac.  On the day of admission, she came to the  emergency room because of chest pressure that was relieved by nitroglycerin.  She was admitted for further evaluation and treatment.   Her cardiac enzymes were negative for MI.  Cardiac catheterization was  indicated and this was performed on December 31, 2003.   The cardiac catheterization showed a 30 to 40% in-stent restenosis in the  RCA.  Her EF was 60%.  There was a 50 to 60% stenosis in the first diagonal  as well as a more distal 50% lesion.  The LAD had a 30% stenosis, and the  circumflex a 40% stenosis.  Dr. Riley Kill evaluated the films and felt that  there was no critical coronary artery disease.  He felt that she needed to  discontinue smoking and better blood pressure control.   By January 01, 2004, Ms. Flud was without chest pain or shortness of  breath.  Her post catheterization labs were within normal limits.  A lipid  profile was performed which showed a total cholesterol of 121, triglycerides  54, HDL 48, LDL 62.  Blood pressure was better controlled with a systolic  blood pressure between 130 and 150.  Ms. Swallows was considered stable for  discharge on January 01, 2004, with outpatient follow-up arranged.   DISCHARGE  DIAGNOSES:  1.  Chest pain, no critical coronary artery disease by catheterization this      admission.  2.  Status post percutaneous transluminal coronary angioplasty and stent to      the right coronary artery in March 2005.  3.  Peripheral vascular disease.  4.  Status post left renal artery stent and right external iliac stent.  5.  Status post laminectomy for spinal stenosis.  6.  History of cataract surgery.  7.  History of breast cancer.  8.  Status post tonsillectomy.  9.  Hysterectomy.  10. Lumpectomy.  11. History of skin cancer removal from her face.  12. Arthritis.   DISCHARGE INSTRUCTIONS:  1.  Her activity level is to include no strenuous activity for two days.  2.  She is to call the office for problems with the catheterization site.  3.  She is to see Dr. Samule Ohm on January 09, 2004, at 2:30 and to follow up      with her family care physician.   DISCHARGE  MEDICATIONS:  1.  Aspirin 81 mg daily.  2.  Atenolol 50 mg daily.  3.  Altace 10 mg daily.  4.  Lipitor 40 mg daily.  5.  Imdur 30 mg daily.  6.  Verapamil 125 mg daily.  7.  Nicoderm patch.      RB/MEDQ  D:  02/20/2004  T:  02/20/2004  Job:  [id number]   cc:   Wanda Plump, MD [facility]  [phone] [place], [place] [address]   Salvadore Farber, M.D. [facility]  [address]  [address]  [place]  [place] [address]

## 2010-06-19 NOTE — Discharge Summary (Signed)
[place]. [facility]  Patient:    [name] [name], [name] [name] Visit Number: [id number] MRN: [id number]          Service Type: DSU Location: 2000 2007 01 Attending Physician:  Veneda Melter Dictated by:   Guy Franco, P.A.-C. Admit Date:  04/04/2001 Discharge Date: 04/05/2001                    Referring Physician Discharge [facility]  DATE OF BIRTH:  1927/08/19  DISCHARGE DIAGNOSES: 1. Peripheral vascular disease. 2. Claudication. 3. Renal artery stenosis. 4. Hypertension. 5. Coronary artery disease, status post stent placement to the right coronary    artery in February 2002.  HOSPITAL COURSE:  [name] [name] is a 77 year old female, with known coronary artery disease and peripheral vascular disease.  She has undergone a left renal artery stent placement as well as lower extremity interventions in the past. She presents with evidence of increased velocities in the right renal artery as well as bilateral lower extremity weakness.  The angiography study performed on April 04, 2001, revealed right SFA stenosis of 80%.  This underwent a PTA with a less than 30% stenosis postprocedure.  She also received a stent to the left external iliac, reducing this from a 70% to a 0% lesion.  She tolerated these procedures well and was planned for discharge to home on April 05, 2001, in stable condition.  LABORATORY STUDIES DURING HOSPITAL STAY:  Total cholesterol 150, LDL 83, HDL 56, triglycerides 53.  BUN 27, creatinine 1.0.  Potassium 4.5, sodium 145. Hemoglobin 14.7, hematocrit 45.1, white count 8.0, platelets 197.  DISCHARGE MEDICATIONS: 1. Plavix 75 mg 1 p.o. q.d. x 1 month. 2. She is to resume her other medications which include Lipitor 10 mg q.d. 3. Norvasc 10 mg 1 p.o. q.d. 4. Atenolol 50 mg 1 p.o. q.d. 5. Altace 10 mg 1 p.o. q.d. 6. Imdur 30 mg 1 p.o. q.d. 7. Enteric-coated aspirin 325 mg 1 p.o. q.d. 8. Sublingual nitroglycerin p.r.n. chest pain.  DISCHARGE  INSTRUCTIONS: 1. No strenuous activity for two days. 2. Remain on a low fat diet. 3. Clean over the left site with soap and water. 4. Call for any questions or concerns. 5. She is to have a vascular ultrasound on April 10, 2001, at 12:30 p.m.    prior to this, she is to go to the lab to have a BMET performed because    she did have some hypokalemia during her hospital stay. 6. In addition, she has a follow-up appointment with Dr. Chales Abrahams on    May 23, 2001, at 2:15 p.m. Dictated by:   Guy Franco, P.A.-C. Attending Physician:  Veneda Melter DD:  04/05/01 TD:  04/05/01 Job: [id number]

## 2010-06-19 NOTE — Cardiovascular Report (Signed)
NAME:  [name] [name], [name] [name]         ACCOUNT NO.:  [id number]   MEDICAL RECORD NO.:  [id number]          PATIENT TYPE:  INP   LOCATION:  3709                         FACILITY:  [facility]   PHYSICIAN:  Arturo Morton. Riley Kill, M.D. [facility] OF BIRTH:  26-Jan-1928   DATE OF PROCEDURE:  12/31/2003  DATE OF DISCHARGE:                              CARDIAC CATHETERIZATION   INDICATIONS:  [name] [name] is a 77 year old who has had prior stenting of  the right coronary artery with NIR stents.  These subsequently required  repeat dilatation with cutting balloon.  She has done clinically well but  had recurrent episodes of chest pain.  She has also had renal stenting as  well as iliac stenting, and she was last studied in March of this year.  She, unfortunately, continues to smoke cigarettes.  She presents with  recurrent substernal chest pain, was seen by Dr. Andee Lineman and set up for  cardiac catheterization study.   PROCEDURE:  1.  Left heart catheterization  2.  Selective coronary arteriography.  3.  Selective left ventriculography.   DESCRIPTION OF PROCEDURE:  The patient was brought to the catheterization  laboratory and prepped and draped in the usual fashion.  We used a Smart  needle to enter the left femoral artery.  A 6 French sheath was placed.  Views of the left and right coronary arteries were obtained in multiple  angiographic projections.  Central aortic and left ventricular pressures  were measured with a pigtail.  Ventriculography is performed in the RAO  projection.  We did give 1.25 mg of Vasotec intravenous because of an  elevated blood pressure.  Overall, this lady tolerated the procedure well.  She was taken to the holding area in satisfactory clinical condition.  She  did have moderate bradycardia with a pulse in the range of about 50 and is  on Verapamil   HEMODYNAMIC DATA:  1.  Central aortic pressure was 179/60, mean 102.  2.  Left ventricle 169/7.  3.  No gradient on pull-back  across the aortic valve.   ANGIOGRAPHIC DATA:  1.  Ventriculography is performed in the RAO projection.  Overall systolic      function was very vigorous.  No segmental wall motion abnormalities were      seen.  2.  The right coronary artery is a previously stented vessel.  There is a      long area of segmental stenting in the proximal portion of the mid      vessel.  This area is quite smooth with about 30-40% smooth narrowing      without high-grade focal stenoses.  The distal right posterior      descending and posterolateral branch were without significant focal      narrowing.  3.  The left main is free of critical disease.  4.  The circumflex provides the first marginal and a very large second      marginal.  The second marginal has about 40% proximal narrowing.  5.  The left anterior descending artery courses to the apex.  There is a      major diagonal branch.  This diagonal branch does have about 50-60%      ostial proximal disease followed by a segmental area of plaquing of      about 40-50% in the mid vessel.  This does not appear to be critical.      There is 30% segmental plaquing that overlies the origin of the septal      perforator in the vessel distal to the diagonal takeoff.  The remainder      of the vessel was without significant disease.   CONCLUSIONS:  1.  Systemic hypertension, questionable control.  2.  Well-preserved left ventricular function with ejection fraction in      excess of 65%.  3.  Continued patency of the right coronary artery with prior percutaneous      stenting and then subsequent restenosis and intervention but without      significant focal renarrowing.  4.  Continued patency of the remainder of the coronary anatomy with      abnormalities as described in the above text.   DISPOSITION:  The patient, unfortunately, continues to smoke despite  extensive peripheral vascular disease and underlying coronary disease.  Discontinuation of smoking  will likely help in her symptomatology.  We will  encourage this.  At the present time, hopefully she will remain on her  current medications which include aspirin, clopidogrel, ACE inhibitor, beta  blocker, calcium channel antagonist, and finally a statin.       TDS/MEDQ  D:  12/31/2003  T:  12/31/2003  Job:  [id number]   cc:   [name] Plump, MD [facility]  [phone] [place], [place] [address]   [name] [name], M.D. [facility]   CV Lab

## 2010-06-19 NOTE — Discharge Summary (Signed)
[place]. [facility]  Patient:    [name] [name], [name] [name]                MRN: [id number] Adm. Date:  [id number] Disc. Date: [id number] Attending:  Veneda Melter Dictator:   Joellyn Rued, P.A.C. CC:         Feliciana Rossetti, M.D.   Referring Physician Discharge [facility]  DATE OF BIRTH:  05-Dec-1927  SUMMARY OF HISTORY:  Ms. Vanduzer is a 77 year old white female who was admitted on September 11, 1999 per Dr. Chales Abrahams secondary to right lower extremity claudication.  ABIs on June 4 were 1.02 and 0.85 on the right and left, respectively.  Angiogram on July 25 showed right iliac common and external disease.  Thus, she is admitted at this time for intervention.  Her history is notable for refractory hypertension, tobacco use, unstable angina with an angioplasty stent placement on May 16 to the right coronary artery.  Residual moderate disease in the left coronary system and normal LV function.  She has known severe bilateral renal artery stenosis, as well as peripheral vascular disease.  Other history is notable for hysterectomy, T&A, appendectomy, breast cancer in 1997 status post lumpectomy with lymph node dissection, skin cancer, hyperlipidemia, spinal stenosis.  She underwent angioplasty and stenting of the left renal artery on July 25.  LABORATORY DATA:  Preadmission EKG showed normal sinus rhythm, LVH.  BUN 20, creatinine 1.0, sodium 138, potassium 3.6, glucose 97.  PT 10.3, PTT 20.5.  H&H 12.6 and 38.0, platelets 244, wbcs 7.9.  HOSPITAL COURSE:  On August 10, Dr. Chales Abrahams performed angioplasty to the right external iliac, reducing this from 70% to 10%, and angioplasty stenting to the right common iliac, reducing this from 70% to less than 10%.  Post sheath removal and bedrest, patient was ambulating the hall without difficulty.  She did not have any orthostatic blood pressure changes.  After being reviewed by Dr. Myrtis Ser, it was felt that she could be discharged  home.  DISCHARGE DIAGNOSES: 1. Peripheral vascular disease, claudication, status post angioplasty stenting    of the right common iliac and angioplasty of the right external iliac. 2. History as previously described.  DISPOSITION:  She is discharged home.  MEDICATIONS:  She is asked to continue her home medications. 1. Plavix 75 mg q.d. for four weeks. 2. Coated aspirin 325 mg q.d. 3. Sublingual nitroglycerin as needed. 4. Norvasc 5 mg q.d. 5. Tenoretic 50/25 q.d.  ACTIVITY:  She is advised no lifting, driving, sexual activity, or heavy exertion for two days.  DIET:  Maintain low salt/fat/cholesterol diet.  WOUND CARE:  If she had any problems with her catheterization site, she was asked to call the office immediately.  SPECIAL INSTRUCTIONS:  She was advised no smoking or tobacco products.  FOLLOW-UP:  She was reminded to call the office on Monday to arrange a four-week appointment with Dr. Chales Abrahams.  She also had many questions concerning her Norvasc and its prohibitive cost, as well as if she has ever had her lipids checked.  She was asked to check with the physician when she follows up with him to have these questions answered. DD:  09/12/99 TD:  09/12/99 Job: [id number]

## 2010-06-19 NOTE — Op Note (Signed)
NAME:  [name] [name], [name] [name] NO.:  [id number]   MEDICAL RECORD NO.:  [id number]                   PATIENT TYPE:  INP   LOCATION:  2855                                 FACILITY:  [facility]   PHYSICIAN:  Patricia Nettle, M.D.                  DATE OF BIRTH:  1927-02-19   DATE OF PROCEDURE:  09/27/2001  DATE OF DISCHARGE:                                 OPERATIVE REPORT   PREOPERATIVE DIAGNOSIS:  Multilevel lumbar spinal stenosis with neurogenic  claudication and left lower extremity radiculopathy.   PROCEDURE:  L2-3, L3-4, L4-5, L5-S1 lumbar laminectomy with partial  facetectomy, foraminotomy, decompressing the common dural sac and nerve  roots bilaterally at each level.   SURGEON:  Patricia Nettle, M.D.   ASSISTANT:  Verlin Fester, P.A.   ANESTHESIA:  General endotracheal.   ESTIMATED BLOOD LOSS:  150 cc.   COMPLICATIONS:  None.   INDICATIONS:  The patient is a 77 year old female with a long history of  progressively worsening back and bilateral lower extremity pain, left  greater than right.  She has symptoms consistent with both neurogenic  claudication as well as left lower extremity radiculopathy.  She failed all  conservative modalities, including multiple epidural steroid injections,  trigger point injections, p.o. steroids, anti-inflammatories, and pain  medications.  Currently she has been on progressively escalating doses of  narcotics, taking 20 mg of OxyContin b.i.d., 5 mg of oxycodone q.2h.  At  this point she is essentially limited to sitting and lying.  She was worked  up with a CT myelogram, which showed multilevel spondylosis.  Significant  findings included bulging disk and facet arthropathy at L2-3.  At L3-4 there  is moderate multifactorial central stenosis with facet arthropathy and  ligamentum flavum hypertrophy.  At L4-5 there is critical multifactorial  central stenosis with foraminal and lateral recess stenosis.  At L5-S1 there  is  again critical multifactorial stenosis with severe foraminal stenosis,  left greater than right.  In addition, there is severe lateral recess  stenosis, left greater than right.  Risks, benefits, and alternatives of  multilevel lumbar laminectomy were extensively discussed with the patient.  She elected to proceed in hopes of improving her symptomatology.  She was  seen by both her cardiologist and medical doctor and cleared for the  proposed surgery.   DESCRIPTION OF PROCEDURE:  The patient was properly identified in the  holding area, taken to the operating room.  She underwent general  endotracheal anesthesia without difficulty.  She was given prophylactic IV  antibiotics. She was turned prone onto the Acromed four posterior  positioning frame.  All bony prominences were padded.  The patient's eyes  were protected at all times.  An incision was made from L2 to S1.  Dissection was carried down to the deep fascia.  The deep fascia was incised  and paraspinal muscles were stripped out to the facet joints.  Care  was  taken to preserve the facet joint capsules.  Intraoperative x-ray was taken  localizing the L4 and L5 spinous processes.  The laminae were cleared of all  soft tissue, clearly exposing the bony landmarks.  Deep retractors were  placed.  At this point a laminectomy was initiated with a Leksell rongeur.  The spinous processes of L3, L4, and L5 as well as the superior portion of  S1 were removed.  There was significant shingling of the laminae and facet  hypertrophy.  A high-speed bur was then used to thin the lamina of L3, L4,  and L5 as well as to remove the medial portion of the facet joints.  We then  used a curette to dissect the ligamentum flavum from the inferior portion of  the L5 lamina.  A 3 mm Kerrison punch was then used to complete a central  laminectomy of L5, L4, and L3.  We encountered partially-calcified and  hypertrophied ligamentum flavum at each level, most  severe at L4-5 and L5-  S1.  The ligamentum flavum was removed from beneath the L2 lamina.  Once  central decompression was completed, the underlying dura was examined and  there was severe hourglass-type constriction at L4-5 and L5-S1 by  hypertrophy of the facet joints and ligamentum flavum.  This was present but  at a less degree at the L3-4 segment.  We placed patties in the lateral  recess to protect the underlying dura and then used a half-inch osteotome to  osteotomize the medial third of the facet complexes at L3-4, L4-5, and L5-  S1.  The Kerrison punches were then used to remove bone and ligamentum  flavum, radically decompressing the nerve roots.  We did this bilaterally.  All bleeding was controlled with Gelfoam and bipolar electrocautery.  When  we were done, the common dural sac and exiting nerve roots were free in the  lateral recess.  We could directly examine the L3, L4, L5, and S1 roots  bilaterally.  We then completed partial foraminotomies by tracing the roots  out the foramen and checking for patency using a blunt probe.  Care was  taken to protect the pars interarticularis at each level to prevent  iatrogenic instability.  We removed no more than one-third of the medial  facet joint on each side.  When we were done, the L3, L4, L5, and S1 roots  were freed from their takeoff and out their foramen bilaterally.  Bleeding  was again meticulously controlled with bipolar electrocautery and Gelfoam.  The wound was copiously irrigated.  Gelfoam was left over the exposed  epidural space.  The deep fascia was closed with a running #1 Vicryl suture.  The subcutaneous layer closed with interrupted 2-0 Vicryl suture.  Skin  closed with a running 3-0 nylon.  Sterile dressings applied.  The patient  was turned supine an extubated without difficulty.  She was transferred to  the recovery room in stable condition, able to move her lower extremities. All needle and sponge counts were  correct.                                               Patricia Nettle, M.D.    MWC/MEDQ  D:  09/27/2001  T:  10/02/2001  Job:  [id number]

## 2010-06-19 NOTE — H&P (Signed)
NAME:  [name] [name], [name] [name]                   ACCOUNT NO.:  [id number]   MEDICAL RECORD NO.:  [id number]                   PATIENT TYPE:  INP   LOCATION:  3742                                 FACILITY:  [facility]   PHYSICIAN:  Salvadore Farber, M.D.             DATE OF BIRTH:  Mar 18, 1927   DATE OF ADMISSION:  04/08/2003  DATE OF DISCHARGE:                                HISTORY & PHYSICAL   CHIEF COMPLAINT:  Chest discomfort.   HISTORY OF PRESENT ILLNESS:  [name] is a 77 year old lady with coronary  and peripheral vascular disease.  She has had multiple percutaneous  interventions on her right coronary artery.  Most recent catheterization was  April 2003.  She had mild disease throughout her left system and ejection  fraction of 60%.  At that point, there was approximately 40% in-stent  restenosis within the RCA.  She has been managed medically since.   For the past week, she has suffered substernal chest pressure like a strap  around her chest.  She is vague on details but feels that it generally lasts  for a few minutes at a time.  It has occurred at rest, but she exerts  herself very minimally.  There are no associated symptoms.  Pain is similar  to prior symptoms of myocardial ischemia.  She has had two such episodes  this morning lasting approximately 1 minutes each.  When describing  symptoms, she makes the Dolores sign.   PAST MEDICAL HISTORY:  1. Dyslipidemia.  2. Chronic back pain status post lumbar laminectomy.  3. Status post angioplasty of the right superficial femoral artery.  4. Status post stenting of the right external iliac artery.  5. Status post left renal artery stent.  6. Moderate right renal artery stenosis.  7. Coronary artery disease as above.   CURRENT MEDICATIONS:  1. Altace 10 mg per day.  2. Atenolol 50 mg per day.  3. Norvasc 10 mg per day.  4. Imdur 30 mg per day.  5. Enteric-coated aspirin 81 mg per day.  6. Lipitor 40 mg per day.  7.  Hydrocodone.  8. Sublingual nitroglycerin p.r.n.   ALLERGIES:  TEQUIN, ADHESIVE TAPE.   SOCIAL HISTORY:  The patient lives with her dog. She has a son who lives in  [place].  She is widowed.  She currently smokes one pack per day of  tobacco, drinks alcohol occasionally.   FAMILY HISTORY:  The patient states she knows no details.   REVIEW OF SYSTEMS:  Negative in detail except as above.   PHYSICAL EXAMINATION:  GENERAL: Thin, nervous-appearing woman in no  distress.  VITAL SIGNS:  Heart rate 62, blood pressure 140/64, weight 116 pounds.  NECK:  No jugular venous distention.  LUNGS:  Clear to auscultation.  CARDIAC:  She has a nondisplaced point of maximal cardiac impulse.  There is  a regular rate and rhythm without murmur, rub, or gallop.  ABDOMEN: Soft,  nondistended, nontender.  There is no hepatosplenomegaly.  Bowel sounds are normal.  EXTREMITIES:  Warm without clubbing, cyanosis, edema, or ulceration.  PULSES:  Carotid pulses are 2+ bilaterally without bruits.  Radial pulses  are 2+ bilaterally.  Right femoral pulses 2+ and left femoral pulses 1+.  There is a bruit on the left.  DP pulses are 2+ bilaterally.  PT pulses are  trace bilaterally.   LABORATORY AND X-RAY DATA:  Electrocardiogram demonstrates normal sinus  rhythm with old anteroseptal myocardial infarction.  There is no change when  compared with the study of September 20, 2002.   IMPRESSION AND PLAN:  #1. Chest pain.  Symptoms concerning for unstable  angina versus non-ST segment elevation myocardial infarction.  Electrocardiogram demonstrates no new signs of myocardia ischemia.  Nonetheless, story is concerning.  We will plan on admitting her to hospital  to rule out myocardial infarction.  Will plan cardiac catheterization  tomorrow.  At that time would proceed with abdominal aortography to exclude  in-stent restenosis of the renal artery stent and assess the known disease  on the contralateral side.  We  will treat her with continued aspirin, beta  blocker, statin, and ACE inhibitor.  Will initiate heparin.   The patient; was advised to be admitted directly from clinic into the  hospital.  However, she states that her dog is in the car with her, and she  has no one who can care for him.  Since she is free of pain at present, she  will take her dog home and then present to booked room in the hospital.  I  have asked her to proceed directly to the emergency room should she have any  recurrence of her discomfort.   #2.  Hypertension:  Continued current medicines.  Assess renal arteries at  cardiac catheterization.   #3.  Dyslipidemia:  Continue current medications.   #4.  Peripheral vascular disease:  Asymptomatic.                                                Salvadore Farber, M.D.    WED/MEDQ  D:  04/08/2003  T:  04/08/2003  Job:  [id number]   cc:   Wanda Plump, MD [facility]  [phone] [place], [place] [address]   Veneda Melter, M.D.

## 2010-06-19 NOTE — Discharge Summary (Signed)
[place]. [facility]  Patient:    [name] [name], [name] [name]                  MRN: [id number] Adm. Date:  07/28/00 Disc. Date: 07/29/00 Dictator:   [name] [name], P.A.-C. CC:         [name] [name], M.D.             [name] [name], M.D.                  Referring Physician Discharge Summa  DATE OF BIRTH:  [birth year]  SUMMARY OF HISTORY:  Basic is a 77 year old white female, who over the preceding couple of weeks had recurring chest discomfort during the day and recently sometimes occurring at night.  It has been occurring on a daily basis since the end of May.  She has not had any exertional chest discomfort, shortness of breath, dizziness, or lightheadedness.  She does occasionally have some mild diaphoresis with these episodes.  They are promptly relieved with nitroglycerin.  She has really needed to use two.  She has recently had three epidural injections by Dr. Montez [name], which have helped her hip and low back discomfort, but her chest discomfort seems to be recurring.  She was recently started on Celebrex without significant benefit.  She has had some problems with hypokalemia and has been on potassium supplementation.  Her history is notable for hypertension, peripheral vascular disease, coronary artery disease with stenting of the RCA, and subsequent stenting and treatment of end-stent restenosis in February of 2002.  In the office on July 12, 2000, she was recommended to undergo cardiac catheterization, however, her husband has been in the intensive care unit and he has not been doing very well.  She was attempting to delay admission prior to his improvement.  She is admitted at this time for cardiac catheterization.  LABORATORY DATA:  The PT was 13.1 and PTT 29.  H&H 12.9 and 37.6, normal indices, platelets 208, WBC 9.6.  Sodium 142, potassium 2.9, BUN 21, creatinine 1.2.  After potassium supplementation on July 28, 2000, her potassium was 4.2.  On the  morning of July 29, 2000, it was back to 3.1.  The EKG showed sinus bradycardia and PACs.  HOSPITAL COURSE:  [name] underwent cardiac catheterization on July 28, 2000, by [name] [name], M.D.  According to his progress notes, she had a 60% ejection fraction.  She was noted to have end-stent restenosis of the RCA of approximately 99%.  [name] [name], M.D., reviewed the films and performed angioplasty and stenting to the mid RCA.  There were proximal and distal edge tears of the RCA lesion.  Post sheath removal and bed rest, she was ambulating in the hall without difficulty.  She was given permission to visit her husband in the intensive care unit.  She received additional potassium supplement on July 29, 2000, and this was rechecked.  Just prior to her discharge, the sodium was 137, potassium 4.5, BUN 16, creatinine 0.9, and glucose 113.  Prior to discharge, her medications were reviewed with [name] [name], M.D., and it was felt that she will be discharged home on atenolol without the combination of HCTZ.  DISCHARGE DIAGNOSES: 1. Unstable angina. 2. Progressive coronary artery disease, status post angioplasty and stenting    to the right coronary artery x 2. 3. Hypokalemia.  DISPOSITION:  She is discharged home.  DISCHARGE MEDICATIONS:  She was given a new prescription for Plavix 75 mg q.d. for four  weeks and a new prescription for atenolol 50 mg q.d.  She was asked to continue her coated aspirin 325 mg q.d., Altace 10 mg q.d., Lipitor 10 mg q.h.s., Vioxx 25 mg q.d., Klor-Con 20 mg q.d., Imdur 15 mg q.d., Benadryl 25 mg as needed, and sublingual nitroglycerin as needed.  ACTIVITY:  She was advised no lifting, driving, sexual activity, or heavy exertion for two days.  DIET:  Maintain low-salt, low-fat, low-cholesterol diet.  WOUND CARE:  If she had any problems with her catheterization site, she was asked to call us immediately.  FOLLOW-UP:  She will have a BMP drawn next week in  our office and follow up with [name] [name], M.D.  She was instructed to call our office to arrange follow-up appointment. DD:  07/29/00 TD:  07/29/00 Job: [id number]

## 2010-06-19 NOTE — Discharge Summary (Signed)
[place]. [facility]  Patient:    [name] [name], [name] [name]                MRN: [id number] Adm. Date:  [id number] Attending:  Lewayne Bunting Dictator:   Tereso Newcomer, P.A.-C.                           Discharge Summary  ADDENDUM:  This patient probably should be on a statin for cholesterol management.  Due to financial concerns, that therapy was not initiated at discharge.  Hopefully, at the followup appointment she will be able to receive some samples. DD:  06/18/99 TD:  06/18/99 Job: [id number]

## 2010-06-19 NOTE — Cardiovascular Report (Signed)
[place]  Patient:    [name] [name], [name] [name] Visit Number: [id number] MRN: [id number]          Service Type: CAT Location: [facility] 2866 01 Attending Physician:  Veneda Melter Dictated by:   Veneda Melter, M.D. [facility] Proc. Date: 05/25/01 Admit Date:  05/25/2001 Discharge Date: 05/25/2001   CC:         [name], M.D. [facility]   Cardiac Catheterization  PROCEDURES PERFORMED: 1. Left heart catheterization. 2. Left ventriculogram. 3. Selective coronary angiogram.  DIAGNOSES 1. Moderate coronary artery disease. 2. Normal left ventricular systolic function.  HISTORY:  The patient is a 77 year old white female with aggressive coronary artery disease.  She has undergone multiple percutaneous interventions of the right coronary artery.  She presents now with fatigue and dyspnea on exertion. It was felt that relook angiography was indicated to rule out restenosis.  TECHNIQUE:  Informed consent was obtained.  The patient was brought to the catheterization lab.  A 6-French sheath was placed in the right femoral artery.  Left heart catheterization, left ventriculogram, and selective coronary angiography were then performed using preformed 6-French Judkins catheters.  After termination of the case, the sheath was removed and manual pressure applied until adequate hemostasis was achieved.  The patient tolerated the procedure well and was transferred to the floor in stable condition.  FINDINGS: 1. Left main trunk:  Medium caliber vessel with moderate ______ . 2. LAD:  This is a medium caliber vessel which provides the first diagonal    branch and the mid section of the LAD has mild diffuse disease at 30%    of the mid section.  The first diagonal branch has an ostial narrowing    of 50%. 3. Left circumflex artery:  This consists of a bifurcating marginal branch    in the mid section.  There is mild diffuse disease at 30% in the left    circumflex  system. 4. Right coronary artery:  The right coronary artery is dominant.  It is a    medium caliber vessel which provides the posterior descending artery    and ______ branch in the terminal segment.  The right coronary artery has    evidence of previously placed stents in the mid section.  There is moderate    InStent restenosis of 30% to 40%.  Mild distal disease is noted as well.    TIMI flow is grade 3. 5. LV:  Normal end-systolic and end-diastolic dimensions.  Overall left    ventricular function is well preserved.  Ejection fraction is greater than    70%.  No mitral regurgitation.  LV pressure is 160/10, aortic is 160/70,    LV EDP equals 15.  ASSESSMENT AND PLAN:  The patient is a 76 year old female with patent stents in the right coronary artery and mild-to-moderate coronary artery disease. Continued medical therapy will be recommended at this point. Dictated by:   Veneda Melter, M.D. [facility] Attending Physician:  Veneda Melter DD:  05/25/01 TD:  05/26/01 Job: [id number]

## 2010-06-19 NOTE — Cardiovascular Report (Signed)
NAME:  [name] [name], [name] [name] NO.:  [id number]   MEDICAL RECORD NO.:  [id number]                   PATIENT TYPE:  INP   LOCATION:  3742                                 FACILITY:  [facility]   PHYSICIAN:  Jonelle Sidle, M.D. [facility]        DATE OF BIRTH:  01/22/1928   DATE OF PROCEDURE:  04/09/2003  DATE OF DISCHARGE:  04/09/2003                              CARDIAC CATHETERIZATION   PRIMARY CARE PHYSICIAN:  Wanda Plump, MD [facility].   CARDIOLOGIST:  Veneda Melter, M.D.   INDICATION:  Ms. Pietrzak is a 77 year old woman with known coronary and  peripheral vascular disease who presents with recent episodes of chest  discomfort and palpitations.  Initial cardiac markers are negative and  electrocardiogram shows nonspecific ST changes.  She is referred for  diagnostic coronary angiography with distal aortography given possible  claudication symptoms as well.   PROCEDURES PERFORMED:  1. Left heart catheterization.  2. Selective coronary angiography.  3. Left ventriculography.  4. Distal aortography.   ACCESS AND EQUIPMENT:  The area about the left femoral artery was  anesthetized with 1% lidocaine and a 6-French sheath was placed in the left  femoral artery via the modified Seldinger technique.  Standard preform 6-  [place] JL-4 and JR-4 catheter was used for selective coronary angiography  and an angled pigtail catheter was used for left heart catheterization, left  ventriculography and distal aortography. All exchanges were made over a wire  and the patient tolerated the procedure well without immediate  complications.   HEMODYNAMICS:  1. Left ventricle 147/5 mmHg.  2. Aorta 148/46 mmHg.   ANGIOGRAPHIC FINDINGS:  1. Left main coronary artery is free of significant flow-limiting coronary     atherosclerosis.  2. The left anterior descending system has minor luminal irregularities.     There is essentially one large diagonal branch and a septal perforator  noted.  3. The circumflex system shows minor luminal irregularities as well.  There     is a small AV groove portion of this system with essentially the lateral     wall being supplied by a large bifurcating obtuse marginal.  4. The right coronary artery is stented in the proximal to mid portion.     There is approximately 40-50% diffuse in-stent restenosis.  Flow is TIMI-     3.  No other major obstructions are noted.  This is a dominant vessel.   Left ventriculography is performed in the RAO projection revealing an  ejection fraction of 70-75%.  No focal wall motion abnormalities and trace  mitral regurgitation in the setting of ventricular ectopy.   Distal aortography shows a 60% ostial right renal artery stenosis.  The left  renal artery has been stented previously.  There is approximately 40% in-  stent restenosis noted, although this is not well seen nonselectively.  The  distal aorta is diffusely diseased to approximately 50% at the take off of  the iliacs.  The left iliac  shows 70% stenoses surrounding the previously  placed stent.  There is a 60% stenosis in the right iliac.  Distal  vasculature is not well defined.   DIAGNOSES:  1. Single-vessel coronary artery disease.  There is 40-50% diffuse in-stent     restenosis in the proximal to mid stented segment of the right coronary     artery.  No other major obstructions are noted.  2. Peripheral vascular disease as described above.  3. Left ventricular ejection fraction of 70-75%.   RECOMMENDATIONS:  I have discussed the films with Dr. Chales Abrahams.  Plan at this  point will be to discharge the patient for a followup Cardiolite and ankle  brachial indices.  She will then see Dr. Chales Abrahams back in clinic to potentially  discuss need for additional peripheral vascular revascularization.  Angiographically, the right coronary artery does not appear significantly  diseased enough to be causing the patient's chest pain at this time.   Cardiolite will help to clarify this issue.                                               Jonelle Sidle, M.D. [facility]    SGM/MEDQ  D:  04/09/2003  T:  04/10/2003  Job:  [id number]

## 2010-06-19 NOTE — Cardiovascular Report (Signed)
[place]. [facility]  Patient:    [name] [name], [name] [name] Visit Number: [id number] MRN: [id number]          Service Type: DSU Location: 2000 2007 01 Attending Physician:  Veneda Melter Dictated by:   Veneda Melter, M.D. Proc. Date: 04/04/01 Admit Date:  04/04/2001                          Cardiac Catheterization  PROCEDURE: 1. Abdominal aortogram. 2. Bilateral lower extremity angiogram. 3. Bilateral selective renal angiography. 4. Percutaneous transluminal angioplasty of the right superficial femoral    artery. 5. Permanent stent placement in the right external iliac artery.  DIAGNOSES: 1. Peripheral vascular disease. 2. Claudication. 3. Renal artery stenosis. 4. Hypertension.  CARDIOLOGIST:  Veneda Melter, M.D.  HISTORY:  Mrs. Sherley is a 77 year old white female with new coronary artery disease and peripheral vascular disease who has undergone left renal artery stent placement as well as lower extremity interventions in the past.  She presents now with evidence of increased velocities in the right renal artery as well as bilateral lower extremity weakness.  She is referred for angiographic study.  TECHNIQUE:  After informed consent was obtained, the patient was brought to the peripheral vascular lab.  A 5-French sheath was placed in the left femoral artery.  A 5-French pigtail catheter was then advanced in the abdominal aorta and abdominal aortogram was then performed using power injection of contrast. Using IMA catheter, selective renal angiography was then performed using manual injections of contrast.  Using IMA catheter, a Wholey wire and end-hole catheter was positioned in the right iliac artery, and right lower extremity angiogram performed.  Left lower extremity angiogram was then performed using power injection through the left femoral sheath.  Digital subtraction angiography was utilized during diagnostic angiography.  INITIAL  FINDINGS: 1. Abdominal aorta has moderate atheromatous buildup.  There is mild disease    of 30 to 40% above the aortic bifurcation with mild aneurysmal dilatation    as well. 2. Left renal artery:  The left renal artery is single and patent.  There    is evidence of a stent in the proximal segment with mild in-stent    restenosis of 20 to 30%. 3. Right renal artery:  The right renal artery is single and patent.  There    is a moderate stenosis of at least 60% in the proximal segment extending    to the ostium. 4. Right lower extremity:  The right iliac artery has a previously placed    stent that is patent with mild restenosis.  The external iliac and    common femoral arteries have mild disease of 30%.  The superficial femoral    artery is patent.  There is eccentric plaque of 50% in the proximal    segment.  There are then sequential narrowings of 70 to 80% in the mid    section.  The distal superficial femoral artery has mild disease of 30%    extending into the popliteal artery.  Three-vessel runoff is noted    distally. 5. Left lower extremity: The left iliac artery has mild stenosis at the    ostium.  There is a focal narrowing of at least 60% just above the    bifurcation.  The external segment then has severe narrowing of 70 to 80%    in the proximal segment.  There is then long diffuse disease of 60%    extending  into the common femoral artery.  The superficial femoral artery    is patent with mild disease of 50% in the proximal segment and 30% in the    mid section.  The popliteal artery has mild irregularities as well.  The    posterior tibial artery is occluded proximally.  Two-vessel runoff is noted    to the angle.  These findings were reviewed with the patient.  We elected to proceed with percutaneous intervention of the right superficial femoral artery and left external iliac artery.  The left external iliac artery had previously been identified as having at least 25  to 30 mmHg; transtenotic gradient across the common iliac artery was less than 10 mmHg.  INTERVENTION:  A 6-French Balkan sheath was then placed across the iliac bifurcation after positioning the [facility] wire into the right iliac artery and the Wholey wire advanced into the distal superficial femoral artery.  A 4 x 6 Powerflex balloon was introduced after heparin was administered intravenously and carefully positioned in the mid right superficial femoral artery.  This was used to dilate the lesions at 10 atmospheres for 120 seconds.  Repeat angiography showed an excellent result with less than 30% residual narrowing, no evidence of vessel damage, and improved flow through the superficial femoral artery.  This was deemed an acceptable result.  The Balkan sheath was retracted and exchanged for a long 6-French which was positioned in the left iliac artery.  A 7 x 18 Genesis stent was then carefully introduced and positioned in the proximal segment of the left external iliac artery and deployed at 6 atmospheres for 20 seconds in its distal segment and again at 6 atmospheres for 20 seconds in the proximal segment.  Some movement of the lumen occurred with positioning of the stent. The balloon was then used to fully dilate the full length of the stent at 10 atmospheres for 30 seconds.  Repeat angiography showed an excellent result with no residual stenosis.  There was mild retrograde dissection just distal to the stent thought secondary to balloon trauma; however, this was not compromising distal flow.  Transtenotic gradient was now 0 mmHg.  This was deemed acceptable result.  The long 6-French sheath was then exchanged for a short 6-French sheath.  This was secured in position, and the patient was transferred to the holding area. The sheath will be removed when the ACT returns to normal.  The patient tolerated the procedure well.  FINAL RESULT: 1. Successful PTA of the mid right superficial  femoral artery with    reduction of 70 to 80% narrowing to less than 30% using a 4 mm balloon. 2. Successful primary stent placement left external iliac artery with     reduction of 70 to 80% narrowing to 0% with placement of a 7 x 18 Genesis    stent.  ASSESSMENT/PLAN:  Mrs. Lockner is a 76 year old female with renal artery stenosis and aggressive peripheral vascular disease.  Continued medical therapy for the right renal artery stenosis will be recommended.  Should she have worsening control of blood pressure or increasing velocities, percutaneous intervention may be necessary.  In addition, she has significant residual disease in the left external iliac artery and common femoral artery of at least 60%.  We will reassess her symptoms after intervening on her left external iliac artery to see if this provided any improvement.  I would like to continue medical therapy of this lesion; however, should she continue to have weakness, percutaneous intervention may be  necessary.  In addition, a significant component of her hip and lower extremity discomfort may be due to spinal stenosis, and reassessment by the orthopedic team may be necessary. Dictated by:   Veneda Melter, M.D. Attending Physician:  Veneda Melter DD:  04/04/01 TD:  04/04/01 Job: [id number]

## 2010-06-19 NOTE — H&P (Signed)
NAMERIDDHI, GRETHER NO.:  [id number]   MEDICAL RECORD NO.:  [id number]                   PATIENT TYPE:   LOCATION:                                       FACILITY:   PHYSICIAN:  Bradd Canary. Payton Emerald, P.A.                 DATE OF BIRTH:   DATE OF ADMISSION:  DATE OF DISCHARGE:                                HISTORY & PHYSICAL   CHIEF COMPLAINT:  Low back pain.   HISTORY OF PRESENT ILLNESS:  This is a 77 year old female who has had a long-  standing history of low back pain for quite some time now.  It has been  refractory to conservative treatments including multiple ESIs, trigger point  injections, several courses of p.o. steroids, anti-inflammatories and pain  medication.  When she was seen in the office by Dr. Noel Gerold it was noted that  she had severe pain with movement.  She preferred lying down.  Extension and  side bending cause left lower extremity pain.  She had an antalgic gait.  She has been able to heel and toe walk.  Sensation was decreased in the left  L5 and S1 distribution.  Straight leg raise was positive bilaterally.  CT  myelogram revealed multifactorial spina stenosis.  It was felt she would  benefit from undergoing a lumbar laminectomy.  The risks and benefits as  well as the procedure were discussed with the patient and she elected to  proceed.  She has obtained cardiac clearance from her cardiologist, Dr.  Veneda Melter of [facility].   ALLERGIES:  No known drug allergies.   MEDICATIONS:  Nitroglycerin p.r.n., Atenolol 50 mg 1 p.o. q.d., Altace 10 mg  1 p.o. q.d., Norvasc 10 mg 1 p.o. q.d., Lipitor 10 mg 1 p.o. q.d., Imdur 30  mg 1 p.o. q.d., Ecotrin 325 mg 1 p.o. q.d., OxyContin 40 mg q.a.m.,  OxyContin 20 mg p.o. q.p.m., Oxycodone 5 mg p.r.n. breakthrough pain,  albuterol p.r.n.   PAST MEDICAL HISTORY:  Significant for hypertension, hyperlipidemia, and  asthma, coronary artery disease, peripheral vascular disease.   PAST  SURGICAL HISTORY:  Significant for angioplasties with stent placements  in 2001, 2002 and 2003.  She had a laser procedure to her forehead in 2003  for skin cancer.  She had a lumpectomy and a lymph node resection to her  right breast in 1997.   SOCIAL HISTORY:  The patient is single.  She is retired.  She smokes one  pack a day of cigarettes.  She denies alcohol use.  She has two children  living.  She has a friend, Teresa Pelton, who will be available for care  after surgery.  She lives in a one level home.   FAMILY HISTORY:  She has a daughter with a history of breast and bone  cancer.  Father significant for a history of TB in the past.   REVIEW OF SYMPTOMS:  GENERAL:  No fevers, chills or night sweats.  CARDIOVASCULAR:  Occasional chest pain.  GI:  Constipation and decreased  appetite on her pain medications.  GU:  No hematuria, dysuria or discharge.  ENDOCRINE:  No history of diabetes mellitus or hypothyroidism.  MUSCULOSKELETAL:  Low back pain with radicular symptoms in the bilateral  lower extremities as discussed up above.  PULMONARY:  No shortness of breath  or productive cough.   PHYSICAL EXAMINATION:  GENERAL:  A thin 77 year old female, alert and  oriented x 3.  VITAL SIGNS:  Pulse 76, respirations 16, blood pressure 150/80.  HEENT:  Head is atraumatic, normocephalic.  Oropharynx is clear.  NECK:  Supple.  Negative for cervical lymphadenopathy.  BREASTS:  No pertinent to present illness.  HEART:  S1, S2.  Negative for murmur, rub or gallop.  Heart regular in rate  and rhythm.  ABDOMEN:  Soft, nontender.  Positive bowel sounds.  GU:  Not pertinent to present illness.  EXTREMITIES:  Please see history of present illness for physical exam to low  back and bilateral lower extremities.  SKIN:  Intact, thin and atrophic.  No rashes or lesions appreciated on exam.  NEUROLOGIC:  PERRLA.  EOMs intact.   LABS AND X-RAYS:  Pending.   IMPRESSION:  1. Spinal stenosis.  2.  Hypertension.  3. Coronary artery disease status post angioplasties.  4. History of lymph node resection and lumpectomy on the right.  5. Asthma.   PLAN:  The patient is scheduled for an L3 through S1 lumbar laminectomy with  Dr. Sharolyn Douglas.                                               Bradd Canary Clabe Seal.    JBB/MEDQ  D:  09/19/2001  T:  09/21/2001  Job:  [id number]

## 2010-06-19 NOTE — Cardiovascular Report (Signed)
[place]. [facility]  Patient:    PRESLYNN, BIER                MRN: [id number] Proc. Date: 03/14/00 Adm. Date:  [id number] Attending:  Veneda Melter CC:         Lacretia Leigh. Quintella Reichert, M.D.   Cardiac Catheterization  PROCEDURES PERFORMED: 1. Left heart catheterization. 2. Left ventriculogram. 3. Selective coronary angiography. 4. Percutaneous transluminal coronary angioplasty and stenting of the mid    right coronary artery.  DIAGNOSES: 1. Severe single-vessel coronary artery disease. 2. In-stent re-stenosis. 3. Normal left ventricular systolic function. 4. Peripheral vascular disease.  INDICATIONS:  Ms. Sampsel is a 77 year old female with a known history of coronary artery disease who previously underwent percutaneous intervention to the mid right coronary artery in May of 2001 for unstable angina.  The patient is also noted to have a history of peripheral vascular disease with study of the right iliac as well as the left renal arteries.  She presents now with recurrent substernal chest discomfort.  The patient was admitted to the hospital and subsequently ruled out for acute myocardial infarction and presents now for further assessment of her cardiac function.  TECHNIQUE:  After informed consent was obtained, the patient was brought to the cardiac catheterization lab where both groins were sterilely prepped and draped.  A 6 French sheath placed into the right femoral artery and preformed Judkins catheter was used to perform selective coronary angiography.  A pigtail catheter was used to perform left ventriculogram and abdominal aortogram.  FINDINGS:  Initial findings are as follows: 1. Left main trunk:  The left main trunk is large caliber vessel with mild    irregularities. 2. LAD:  This is a large caliber vessel that provides the first diagonal    branch in the proximal segment.  The LAD has eccentric narrowing of    30-40% in the mid  section.  The first diagonal branch has diffuse disease    of 50% in its proximal segment. 3. Left circumflex artery:  This is a medium caliber vessel that provides    the trivial first marginal branch in the proximal segment and bifurcating    second marginal branch in the mid section.  There was moderate diffuse    disease of 30% in the second marginal branch. 4. Right coronary artery:  The right coronary artery is dominant.  This is a    medium caliber vessel that provides the posterior descending artery and    posterior ventricular branch in its terminal segment.  The right coronary    artery has moderate diffuse disease in the mid section.  There is severe    narrowings of 90% within an area of previous stenting extending just beyond    the stent placement.  LEFT VENTRICULOGRAM:  Normal end-systolic and end-diastolic dimensions. Overall left ventricular function is well preserved, ejection fraction of greater than 55%.  No mitral regurgitation.  LV pressure is 120/70, aortic is 120/50.  LVEDP equals 12.  Abdominal aorta is of normal caliber.  There is moderate thrombus buildup with a narrowing of 30% just above the iliac bifurcation.  This is followed by mild aneurysmal dilatation of the vessel.  The right renal artery is single and patent with a moderate narrowing of 50-70% in the proximal segment.  Left renal artery is single and patent.  There is a stent in the proximal segment that is widely patent with modest re-stenosis of 30-40%.  The left  iliac artery has a narrowing of 30% in the proximal segment with a further narrowing of 70% at the bifurcation of the internal and external segments.  The external iliac artery has a further narrowing of 70% in its mid section.  The internal iliac artery has a high-grade narrowing of 80% in the proximal segment.  The right iliac artery has moderate narrowings in the proximal segment.  The previously placed stent is widely patent.  There is  a further narrowing of 50% at the bifurcation.  The internal iliac artery has moderate diffuse disease of 90% in the proximal segment.  These findings were reviewed with the patient and we elected to proceed with percutaneous intervention of the right coronary artery.  The 6 French sheath in the right femoral artery was exchanged over a wire for a 7 [place] sheath. The patient had previously been on Integrilin.  This was supplemented with heparin to maintain the ACT of greater than 250 seconds.  A 7 [place] guide catheter was then used to engage the right coronary artery and a 0.014 inch Hi-Torque Floppy wire advanced to the distal RCA.  A 3.25 x 10 mm Cutting Balloon was introduced.  This could be advanced almost to the distal segment of the stent but not to the distal segment of the lesion.  Two inflations were performed in the mid RCA at 10 atmospheres for 30 seconds.  The Cutting Balloon was then replaced with a 3.0 x 20 mm Maverick and this was used to further dilate the mid RCA.  Two inflations were performed at 12 atmospheres for 60 seconds.  Repeat angiography now showed significant improvement in vessel lumen with modest residual narrowing just distal to the stent in diseased native vessel with a residual disease of 30-40%.  A 3.0 x 12 mm NIR Elite stent was introduced and deployed in the mid RCA just distal to the existing stent with some overlap at 12 atmospheres for 45 seconds.  This stent delivery system was brought back slightly and used to further dilate the junction at 16 atmospheres for 30 seconds.  Final angiography was then performed after the administration of intracoronary nitroglycerin showing no evidence of distal vessel damage and TIMI-3 flow throughout the right coronary artery.  During the procedure, the floppy wire was exchanged for a Mailman for support in passing balloons.  Final angiography at the end of the case after removal of the wire confirmed no distal  vessel damage.  The guide catheter was then removed, and the sheath secured in position.  The patient was then  transferred to the holding area in stable condition.  FINAL RESULTS:  Successful percutaneous transluminal coronary angioplasty and stenting of the mid right coronary artery with reduction of in-stent re-stenosis of 90% to less than 10% with further coverage of the mid right coronary artery with a 3.0 x 12 mm NIR Elite stent.  ASSESSMENT AND PLAN:  Ms. Vandenbos is a 78 year old female with aggressive coronary artery disease.  Patient continues to smoke and has multiple cardiovascular risk factors.  Aggressive attention will be paid to these. Should she have recurrent disease in the right coronary artery, consideration may be given towards brachytherapy. DD:  03/14/00 TD:  03/15/00 Job: [id number]

## 2010-06-19 NOTE — Cardiovascular Report (Signed)
[place]. [facility]  Patient:    [name] [name], [name] [name] Visit Number: [id number] MRN: [id number]          Service Type: CAT Location: 3700 [id number] Attending Physician:  Veneda Melter Dictated by:   Veneda Melter, M.D. Proc. Date: 12/21/00 Admit Date:  12/21/2000   CC:         [name] [name], M.D.   Cardiac Catheterization  PROCEDURE: 1. Left heart catheterization 2. Left ventriculogram. 3. Selective coronary angiography. 4. Percutaneous transluminal coronary angioplasty of the right coronary    artery.  DIAGNOSES: 1. Single-vessel coronary artery disease. 2. Normal left ventricular systolic function. 3. In-stent restenosis.  CARDIOLOGIST:  Veneda Melter, M.D.  HISTORY:  [name] [name] is a 77 year old white female with known history of coronary artery disease who has previously undergone percutaneous intervention with stent placement to the mid right coronary artery in February 2002.  She subsequently had in-stent restenosis and underwent percutaneous intervention in June 2002 with placement of two additional stents proximal and distal to the previously placed stent.   She presents now for assessment of recurrent substernal chest discomfort.  TECHNIQUE:  After informed consent was obtained, the patient was brought to the catheterization lab.  A 6-French sheath was placed in the right femoral artery.  Left heart catheterization, left ventriculogram, and selective coronary angiography were then performed in the usual fashion using preformed 6-French Judkins catheters.  INITIAL FINDINGS: 1. Left main trunk: A large caliber vessel with mild irregularities. 2. LAD.  This is a medium caliber vessel that provides a first diagonal    branch in the proximal segment.  The LAD has mild disease of 30% in the    proximal and mid sections.  The first diagonal branch has diffuse disease    of 50% in the proximal section. 3. Left circumflex artery: This is  a medium caliber vessel that provides two    trivial marginal branches in the proximal segment and bifurcating third    marginal branch in the mid section before continuing on as an A-V    circumflex.  The A-V circumflex and large marginal branch have mild disease    of 30%. 4. Right coronary artery is dominant.  This is a medium caliber vessel that    provides a posterior descending artery and and a posterior ventricular    branch in its terminal segment.   There is evidence of three previously    placed stents in the mid section of the right coronary artery.  There is    severe narrowing of 70% within the first stent, mild disease of 30% within    the mid stent, and severe narrowing of 95% in the distal stent.  The distal    vessel has moderate disease of 30%.  LEFT VENTRICULOGRAPHY:  Normal end-systolic and end-diastolic dimensions. Overall left ventricular function well preserved, ejection fraction greater than 70%.  No mitral regurgitation.  LV pressure 134/0, aortic 134/44.  LV EDP equals 7.  INTERVENTION NOTE:   These findings were reviewed with the patient, and due to stability of the stent in the mid section of the right coronary artery, it is felt that further attempt at percutaneous intervention with cutting balloon would be reasonable, and should she have restenosis, consider adjuvant brachytherapy.  The patient then received heparin and Integrilin on a weight-adjusted basis, maintaining ACT of greater than 225 seconds.  A 6-French JR4 guide catheter was used in the right coronary artery and an extra support  wire advanced in the distal RCA.  A 3.0 x 15 mm cutting balloon was introduced, and several inflations were performed within the mid right coronary artery and up to 12 atmospheres for 60 seconds with repeat angiography showing excellent result with significant improvement in vessel lumen.  There was perhaps 20% residual narrowing within the area of stenting. There was  mild residual disease of 30% just distal to the stented segment thought secondary to balloon overlap and resultant vessel recoil.  There was no evidence of vessel damage, and this was deemed acceptable result.  Coronary angiography was performed in various projections confirming no distal vessel damage and TIMI-3 flow through the right coronary artery.  The patient did have chest discomfort reminiscent of her pain on presentation.  These were resolved at the end of the case.  The guide catheter was then removed.  The sheaths were secured in position.  The patient was transferred to the holding area in stable condition.  The sheaths will be removed when ACT returns to normal.  FINAL RESULT:  Successful PTCA of the mid right coronary artery with reduction of 95% in-stent restenosis to less than 20% using a 3.0 mm cutting balloon.  ASSESSMENT/PLAN:  [name] [name] is a 77 year old female with aggressive coronary artery disease affecting the right coronary artery.  She has mild disease in the left coronary system that can be medically managed, and her left ventricular function is well preserved.  We will plan on bringing the patient back for early relook angiogram in approximately four months.  Should she have restenosis at that time, adjuvant brachytherapy should be administered. Dictated by:   Veneda Melter, M.D. Attending Physician:  Veneda Melter DD:  12/21/00 TD:  12/21/00 Job: [id number]

## 2010-06-19 NOTE — Op Note (Signed)
   NAME:  [name] [name], [name] [name]                   ACCOUNT NO.:  [id number]   MEDICAL RECORD NO.:  [id number]                   PATIENT TYPE:  AMB   LOCATION:  ENDO                                 FACILITY:  [facility]   PHYSICIAN:  John C. Madilyn Fireman, M.D.                 DATE OF BIRTH:  07/21/1927   DATE OF PROCEDURE:  09/03/2002  DATE OF DISCHARGE:                                 OPERATIVE REPORT   PROCEDURE:  Colonoscopy and polypectomy.   INDICATIONS FOR PROCEDURE:  Personal history of breast cancer with last  colon screen by sigmoidoscopy five years ago.   DESCRIPTION OF PROCEDURE:  The patient was placed in the left lateral  decubitus position and placed on the pulse monitor with continuous low-flow  oxygen delivered by nasal cannula.  She was sedated with 75 mcg IV fentanyl,  5 mg IV Versed.  The Olympus video colonoscope was inserted into the rectum  and advanced to the cecum, confirmed by transillumination of McBurney's  point and visualization of the ileocecal valve and appendiceal orifice.  Prep was excellent.  The cecum appeared normal with no masses, polyps,  diverticula, or other mucosal abnormalities.  In the ascending colon there  was an 8 mm polyp removed by snare.  The remainder of the ascending,  transverse, descending and sigmoid colon appeared normal.  In the rectum, at  about 15 cm, there was a second similar appearing 8 mm sessile polyp that  was removed by snare.  The scope was then withdrawn and the patient returned  to the recovery room in stable condition.  She tolerated the procedure well  and there were no immediate complications.   IMPRESSION:  Ascending and rectal polyps.   PLAN:  Await histology to determine method and intervals of future colon  screening.                                                John C. Madilyn Fireman, M.D.    [facility]/MED[facility]  D:  09/03/2002  T:  09/03/2002  Job:  [id number]

## 2010-06-19 NOTE — Cardiovascular Report (Signed)
[place]. [facility]  Patient:    [name] [name], [name] [name]                MRN: [id number] Adm. Date:  [id number] Attending:  Veneda Melter CC:         [name], M.D., [facility]  Veneda Melter, M.D.   Cardiac Catheterization  DATE OF BIRTH:  [birth year].  REFERRING PHYSICIAN:  [name], M.D., [facility]  CARDIOLOGIST:  Veneda Melter, M.D.  PROCEDURES PERFORMED: 1. Left heart catheterization with selective coronary angiography. 2. Ventriculography.  DIAGNOSES: 1. In-stent restenosis of the right coronary artery. 2. Normal left ventricular systolic function.  INDICATIONS:  [name] [name] is a 77 year old female with a history of coronary artery disease.  The patient is status post stent placement to the right coronary artery.  In February 2002, due to recurrent chest pain, the patient underwent a repeat cardiac catheterization by Dr. Chales Abrahams.  She was found to have high grade in-stent restenosis and had a cutting balloon angioplasty performed with overlapping stent placement.  The patient now presents with recurrent substernal chest pain and has been referred for diagnostic catheterization for assessment of coronary anatomy.  DESCRIPTION OF PROCEDURE:  After informed consent was obtained, the patient was brought to the catheterization laboratory.  Lidocaine, 1%, was infiltrated in the right groin after sterile prepping and draping.  A 6-French arterial sheath was placed using the modified Seldinger technique.  Subsequently, JL4 and JR4 catheters were used to engage the left and right coronary ostia, respectively.  Angiographic images were then obtained in various projections using manual injections of contrast.  After selective coronary angiography, ventriculography was performed placing a 6-French angled pigtail catheter in the left ventricular cavity.  Appropriate left-sided hemodynamics were obtained.  Ventriculography was performed in a single  plane RAO projection using power injections of contrast.  At the termination of the case, the catheters were removed, but the sheath was left in place, and Dr. Chales Abrahams was notified.  The patient will proceed with brachytherapy of the right coronary lesion.  FINDINGS:  Hemodynamics:  Left ventricular pressure 117/12 mmHg.  Aortic pressure 117/60 mmHg.  Ventriculography:  Ejection fraction 60% with no segmental wall motion abnormalities.  There is no significant mitral regurgitation.  Selective coronary angiography: 1. The left main coronary artery is a large caliber vessel with no evidence of    flow-limiting coronary artery disease. 2. The left anterior descending artery is a moderate sized vessel with diffuse    plaquing in its mid segment of approximately 30-40%.  Prior to this area of    focal narrowing is a large first diagonal branch with an ostial 50% lesion. 3. The left circumflex coronary artery is a moderate caliber vessel giving    rise to a smaller obtuse marginal branch.  There was diffuse 30% stenosis    in its proximal segment, but there was otherwise no flow-limiting coronary    artery disease in this distribution. 4. The right coronary artery was a large caliber vessel.  Overlapping stents    are visualized in the mid segment of the right coronary artery.  The most    proximal stent has a 99% in-stent restenosis.  Further distal at the level    of the overlapping stents, there appears to be a diffuse 50% stenosis.  The    remainder of the vessel is free of significant flow-limiting coronary    artery disease.  CONCLUSIONS: 1. High grade in-stent restenosis of the  right coronary artery. 2. Normal left ventricular systolic function.  RECOMMENDATIONS:  Results have been reviewed with Dr. Chales Abrahams, and the plan is to proceed with brachytherapy to the right coronary artery.  This will be acutely arranged and will be performed by Dr. Chales Abrahams.  The patients friend, Norberto Sorenson,  has been notified of these findings. DD:  07/28/00 TD:  07/28/00 Job: [id number]

## 2010-06-19 NOTE — Op Note (Signed)
[place]. [facility]  Patient:    [name] [name], [name] [name]                MRN: [id number] Proc. Date: 11/03/99 Adm. Date:  [id number] Attending:  Loura Halt Ii CC:         Hope M. Danella Deis, M.D.  Arrie Aran, M.D.   Operative Report  PREOPERATIVE DIAGNOSIS:  1.2 cm basal cell carcinoma left cheek.  POSTOPERATIVE DIAGNOSIS:  1.2 cm basal cell carcinoma left cheek.  PROCEDURE:  Excision basal cell carcinoma left cheek.  SURGEON:  Dr. Delia Chimes  ANESTHESIA:  2% Xylocaine 1:100,000 epinephrine.  INDICATION FOR SURGERY:  This is a 77 year old woman with a biopsy proven basal cell carcinoma left cheek.  It is in an area just below the previous excision.  It is separate from the previous excision and for that reason I have opted not to take down the old scar.  The patient understands she will be trading what she has for a permanent potentially unsitely scar.  In spite of that she wishes to proceed with the operation.  PROCEDURE IN DETAIL:  Skin marks were placed in elliptical fashion over approximately 2-3 mm margins around the lesion.  Local anesthesia was infiltrated and the left cheek was prepped and draped in a sterile fashion. After waiting 10 minutes a elliptical excision of the lesion was carried out and the lesion was passed off for pathology.  An additional superior margin was recut and submitted under separate pathology report.  Hemostasis was accomplished using pressure.  Wound edges were undermined for a distance of 3-4 mm.  The dermis was closed using multiple interrupted 5-0 Vicryl suture.  Skin was united using a running 6-0 nylon suture.  The area was cleansed and a light dressing was applied.  The patient was discharged to home in satisfactory condition. DD:  11/03/99 TD:  11/02/99 Job: [id number]

## 2010-07-02 ENCOUNTER — Emergency Department (HOSPITAL_COMMUNITY): Payer: Medicare Other

## 2010-07-02 ENCOUNTER — Inpatient Hospital Stay (HOSPITAL_COMMUNITY)
Admission: EM | Admit: 2010-07-02 | Discharge: 2010-07-05 | DRG: 189 | Disposition: A | Discharge status: Home / Self Care | Payer: Medicare Other | Attending: Internal Medicine | Admitting: Internal Medicine

## 2010-07-02 DIAGNOSIS — Z7982 Long term (current) use of aspirin: Secondary | ICD-10-CM

## 2010-07-02 DIAGNOSIS — J189 Pneumonia, unspecified organism: Secondary | ICD-10-CM | POA: Diagnosis present

## 2010-07-02 DIAGNOSIS — I10 Essential (primary) hypertension: Secondary | ICD-10-CM | POA: Diagnosis present

## 2010-07-02 DIAGNOSIS — I2510 Atherosclerotic heart disease of native coronary artery without angina pectoris: Secondary | ICD-10-CM | POA: Diagnosis present

## 2010-07-02 DIAGNOSIS — I739 Peripheral vascular disease, unspecified: Secondary | ICD-10-CM | POA: Diagnosis present

## 2010-07-02 DIAGNOSIS — Z888 Allergy status to other drugs, medicaments and biological substances status: Secondary | ICD-10-CM

## 2010-07-02 DIAGNOSIS — J9620 Acute and chronic respiratory failure, unspecified whether with hypoxia or hypercapnia: Principal | ICD-10-CM | POA: Diagnosis present

## 2010-07-02 DIAGNOSIS — J9819 Other pulmonary collapse: Secondary | ICD-10-CM | POA: Diagnosis present

## 2010-07-02 DIAGNOSIS — F17200 Nicotine dependence, unspecified, uncomplicated: Secondary | ICD-10-CM | POA: Diagnosis present

## 2010-07-02 DIAGNOSIS — Z886 Allergy status to analgesic agent status: Secondary | ICD-10-CM

## 2010-07-02 DIAGNOSIS — J441 Chronic obstructive pulmonary disease with (acute) exacerbation: Secondary | ICD-10-CM | POA: Diagnosis present

## 2010-07-02 LAB — CBC
HCT: 45.4 % (ref 36.0–46.0)
Hemoglobin: 15 g/dL (ref 12.0–15.0)
MCH: 30.8 pg (ref 26.0–34.0)
MCHC: 33 g/dL (ref 30.0–36.0)
MCV: 93.2 fL (ref 78.0–100.0)
Platelets: 180 10*3/uL (ref 150–400)
RBC: 4.87 MIL/uL (ref 3.87–5.11)
RDW: 14.7 % (ref 11.5–15.5)
WBC: 7.1 K/uL (ref 4.0–10.5)

## 2010-07-02 LAB — CARDIAC PANEL(CRET KIN+CKTOT+MB+TROPI)
CK, MB: 4.5 ng/mL — ABNORMAL HIGH (ref 0.3–4.0)
Relative Index: INVALID (ref 0.0–2.5)
Troponin I: <0.3 ng/mL (ref <0.30)

## 2010-07-02 LAB — BASIC METABOLIC PANEL WITH GFR
BUN: 18 mg/dL (ref 6–23)
CO2: 25 meq/L (ref 19–32)
Calcium: 9.6 mg/dL (ref 8.4–10.5)
Creatinine, Ser: 0.62 mg/dL (ref 0.4–1.2)
GFR calc non Af Amer: >60 mL/min (ref >60)
Glucose, Bld: 106 mg/dL — ABNORMAL HIGH (ref 70–99)

## 2010-07-02 LAB — PROTIME-INR
INR: 0.83 (ref 0.00–1.49)
Prothrombin Time: 11.6 seconds (ref 11.6–15.2)

## 2010-07-02 LAB — CK TOTAL AND CKMB (NOT AT ARMC)
CK, MB: 3 ng/mL (ref 0.3–4.0)
Relative Index: INVALID (ref 0.0–2.5)
Total CK: 69 U/L (ref 7–177)

## 2010-07-02 LAB — LACTIC ACID, PLASMA: Lactic Acid, Venous: 0.9 mmol/L (ref 0.5–2.2)

## 2010-07-02 LAB — SAMPLE TO BLOOD BANK

## 2010-07-02 LAB — POCT I-STAT 3, ART BLOOD GAS (G3+)
Bicarbonate: 24.2 mEq/L — ABNORMAL HIGH (ref 20.0–24.0)
TCO2: 25 mmol/L (ref 0–100)
pCO2 arterial: 39.1 mmHg (ref 35.0–45.0)
pH, Arterial: 7.399 (ref 7.350–7.400)

## 2010-07-02 LAB — PROCALCITONIN
Procalcitonin: <0.1 ng/mL
Procalcitonin: <0.1 ng/mL

## 2010-07-02 LAB — D-DIMER, QUANTITATIVE: D-Dimer, Quant: 1.31 ug{FEU}/mL — ABNORMAL HIGH (ref 0.00–0.48)

## 2010-07-02 LAB — TROPONIN I: Troponin I: <0.3 ng/mL (ref <0.30)

## 2010-07-02 LAB — PRO B NATRIURETIC PEPTIDE: Pro B Natriuretic peptide (BNP): 1512 pg/mL — ABNORMAL HIGH (ref 0–450)

## 2010-07-02 LAB — DIFFERENTIAL
Basophils Absolute: 0 K/uL (ref 0.0–0.1)
Basophils Relative: 0 % (ref 0–1)
Eosinophils Absolute: 0.3 10*3/uL (ref 0.0–0.7)
Eosinophils Relative: 5 % (ref 0–5)
Lymphocytes Relative: 20 % (ref 12–46)
Lymphs Abs: 1.4 K/uL (ref 0.7–4.0)
Monocytes Absolute: 0.6 10*3/uL (ref 0.1–1.0)
Monocytes Relative: 9 % (ref 3–12)
Neutro Abs: 4.7 K/uL (ref 1.7–7.7)
Neutrophils Relative %: 67 % (ref 43–77)

## 2010-07-02 LAB — GLUCOSE, CAPILLARY: Glucose-Capillary: 99 mg/dL (ref 70–99)

## 2010-07-02 LAB — BASIC METABOLIC PANEL
Chloride: 105 mEq/L (ref 96–112)
GFR calc Af Amer: >60 mL/min (ref >60)
Potassium: 3.9 mEq/L (ref 3.5–5.1)
Sodium: 142 mEq/L (ref 135–145)

## 2010-07-02 LAB — APTT: aPTT: 28 s (ref 24–37)

## 2010-07-02 MED ORDER — IOHEXOL 300 MG/ML  SOLN
80.0000 mL | Freq: Once | INTRAMUSCULAR | Status: AC | PRN
  Administered 2010-07-02: 80 mL via INTRAVENOUS

## 2010-07-03 ENCOUNTER — Other Ambulatory Visit: Payer: Self-pay | Admitting: Pulmonary Disease

## 2010-07-03 ENCOUNTER — Inpatient Hospital Stay (HOSPITAL_COMMUNITY): Payer: Medicare Other

## 2010-07-03 DIAGNOSIS — I059 Rheumatic mitral valve disease, unspecified: Secondary | ICD-10-CM

## 2010-07-03 LAB — CARDIAC PANEL(CRET KIN+CKTOT+MB+TROPI): CK, MB: 4.6 ng/mL — ABNORMAL HIGH (ref 0.3–4.0)

## 2010-07-03 LAB — BASIC METABOLIC PANEL
Chloride: 106 mEq/L (ref 96–112)
GFR calc Af Amer: >60 mL/min (ref >60)
Potassium: 3.3 mEq/L — ABNORMAL LOW (ref 3.5–5.1)

## 2010-07-03 LAB — CBC
HCT: 42.5 % (ref 36.0–46.0)
MCH: 31 pg (ref 26.0–34.0)
MCHC: 33.9 g/dL (ref 30.0–36.0)
RDW: 14.8 % (ref 11.5–15.5)

## 2010-07-04 ENCOUNTER — Inpatient Hospital Stay (HOSPITAL_COMMUNITY): Payer: Medicare Other

## 2010-07-04 LAB — CBC
Hemoglobin: 12.5 g/dL (ref 12.0–15.0)
MCH: 30.5 pg (ref 26.0–34.0)
MCV: 93.7 fL (ref 78.0–100.0)
RBC: 4.1 MIL/uL (ref 3.87–5.11)

## 2010-07-04 LAB — BASIC METABOLIC PANEL
BUN: 40 mg/dL — ABNORMAL HIGH (ref 6–23)
CO2: 24 mEq/L (ref 19–32)
Chloride: 106 mEq/L (ref 96–112)
Creatinine, Ser: 0.89 mg/dL (ref 0.4–1.2)

## 2010-07-05 DIAGNOSIS — J9600 Acute respiratory failure, unspecified whether with hypoxia or hypercapnia: Secondary | ICD-10-CM

## 2010-07-05 DIAGNOSIS — J449 Chronic obstructive pulmonary disease, unspecified: Secondary | ICD-10-CM

## 2010-07-05 DIAGNOSIS — J9819 Other pulmonary collapse: Secondary | ICD-10-CM

## 2010-07-05 LAB — CULTURE, RESPIRATORY W GRAM STAIN

## 2010-07-06 ENCOUNTER — Other Ambulatory Visit: Payer: Self-pay | Admitting: Physician Assistant

## 2010-07-07 ENCOUNTER — Telehealth: Payer: Self-pay | Admitting: Pulmonary Disease

## 2010-07-07 NOTE — Telephone Encounter (Signed)
Please advise Dr Vassie Loll, bronch was done 07/03/10, thanks!

## 2010-07-07 NOTE — Telephone Encounter (Signed)
Negative so far Will discuss more during FU visit

## 2010-07-07 NOTE — Telephone Encounter (Signed)
Spoke with pt and notified of results/recs per RA.  Pt verbalized understanding and denied any questions.

## 2010-07-08 ENCOUNTER — Telehealth: Payer: Self-pay | Admitting: *Deleted

## 2010-07-08 LAB — CULTURE, BLOOD (ROUTINE X 2)
Culture  Setup Time: 201205311934
Culture  Setup Time: 201205311934
Culture: NO GROWTH
Culture: NO GROWTH

## 2010-07-08 NOTE — Telephone Encounter (Addendum)
Pt was in the hospital and was treated for lung problems. Taking 81 mg ASA, Amlodopine 10 mg one po daily.  Imdur 60 mg. One po daily. BP:  182/68, 173/68.  Post hospital appt scheduled.

## 2010-07-08 NOTE — Telephone Encounter (Signed)
Per Dr. Fabian Sharp- forward to Dr. Jens Som for advice on this. Pt aware that we are doing this.

## 2010-07-09 ENCOUNTER — Telehealth: Payer: Self-pay | Admitting: Cardiology

## 2010-07-09 MED ORDER — HYDRALAZINE HCL 25 MG PO TABS: 25.0000 mg | ORAL_TABLET | Freq: Three times a day (TID) | ORAL | Status: DC

## 2010-07-09 NOTE — Telephone Encounter (Signed)
Pt calling re bp being high has questions re medication and why [facility] took her off of one

## 2010-07-09 NOTE — Telephone Encounter (Signed)
Spoke with pt, she is having trouble with bp being elevated. She is aware dr Fabian Sharp and dr Jens Som have reviewed her records from the hosp. She will start hydralazine 25mg  tid and call with problems or if bp remains elevated [name] [name]

## 2010-07-09 NOTE — Telephone Encounter (Signed)
Add hydralazine 25 tid Olga Millers

## 2010-07-10 NOTE — Discharge Summary (Signed)
[name] [name], [name] [name]         ACCOUNT NO.:  [id number]  MEDICAL RECORD NO.:  [id number]           PATIENT TYPE:  I  LOCATION:  4740                         FACILITY:  [facility]  PHYSICIAN:  [name] Milch, MD      DATE OF BIRTH:  05/13/1927  DATE OF ADMISSION:  07/02/2010 DATE OF DISCHARGE:  07/05/2010                              DISCHARGE SUMMARY   DISCHARGE DIAGNOSES: 1. Acute exacerbation of chronic obstructive pulmonary disease. 2. Atelectasis in the setting of endobronchial narrowing.  PROCEDURES:  Fiberoptic bronchoscopy, this was performed by Dr. Cyril Mourning on July 03, 2010.  LABORATORY DATA:  Sputum culture, July 03, 2010, demonstrates non pathogenic oropharyngeal type flora.  AFB was negative in preliminary findings.  Blood chemistry on July 04, 2010, sodium 139, potassium 4.1, chloride 106, CO2 24, glucose 150, BUN 40, creatinine 0.89.  White blood cell count 11.1, hemoglobin 12.5, hematocrit 38.4, platelet count 183. Blood cultures x2 on Jul 02, 2010, both negative.  BRIEF HISTORY:  This is an 77 year old female patient with a known history of coronary artery disease, peripheral vascular disease, possible COPD, now followed by a pulmonologist, hypertension, active smoker, presented acutely on Jul 02, 2010 with shortness of breath and left-sided pleuritic-type chest discomfort, not relieved with nitroglycerin.  She was found to be hypoxic in the emergency room and because of this Pulmonary was asked to admit.  HOSPITAL COURSE BY DISCHARGE DIAGNOSES: 1. Acute exacerbation of chronic obstructive pulmonary disease in a     patient who actively smokes.  [name] was admitted to the     intensive care on noninvasive positive pressure ventilation.     Therapeutic interventions included empiric antibiotics, scheduled     bronchodilators, systemic steroids, and supplemental oxygen.  She     continued to make improvements over the course of her     hospitalization and  now reports that her dyspnea is back to     baseline.  She is ambulated in the hall without oxygen.  Although,     in observation, she appears dyspneic, subjectively she reports no     dyspnea.  Her saturations remained 94% with exertion prior to     discharge.  From a pulmonary standpoint, she will be discharged     home with instructions to continue slow prednisone taper over 2-     week time frame, she will complete four more days of empiric     antibiotics.  She has been narrowed to Augmentin, and she has been     given a prescription for Xopenex on a p.r.n. basis.  We will     continue to watch her status in the outpatient setting.  She will     be now followed by Pulmonary in the outpatient setting.  I     instructed her that should she used her bronchodilator more than     once a day, we would like to know about that, and will evaluate her     in the outpatient setting as to whether or not she could benefit     from a maintenance bronchodilator therapy.  For now, we will  continue p.r.n. bronchodilation, and encourage smoking cessation.     We will defer further outpatient PFT evaluation for after     resolution of acute illness. 2. Atelectasis in the setting of endobronchial lesion.  [name]     underwent fiberoptic bronchoscopy on July 03, 2010.  This was     without incident.  She will be discharged to home.  Pathology is     currently pending.  Chest x-ray is improved from an atelectasis     standpoint with better aeration.  DISCHARGE INSTRUCTIONS:  Diet as tolerated.  DISCHARGE ACTIVITY:  Increase as tolerated.  DISCHARGE MEDICATIONS: 1. Augmentin 875 one tablet twice a day for four more days. 2. Nicotine patch over-the-counter 14 mg every 24 hours. 3. Prednisone 10 mg taper 40 mg down to 10 mg and discontinue.  She     will take 4 tablets daily for 3 days, and 3 tablets daily for 3     days, and 2 tablets daily for 3 days, then 1 tablet daily for 3     days, then  stop. 4. Xopenex inhaler 2 puffs as needed every 4 hours for shortness of     breath. 5. Amlodipine 10 mg tab daily. 6. Aspirin 81 mg daily. 7. Imdur 60 mg daily. 8. Nitroglycerin sublingual p.r.n. 9. Vitamin D3 1000 units over-the-counter daily. 10.She had been instructed to discontinue atenolol.  She will be follow-up in our outpatient office with Dr. Cyril Mourning in 1- week time frame.  We will make this appointment for her when our office reopens.  DISPOSITION:  Ms. Lie has met maximum benefit from inpatient stay. She is now medically cleared for discharge and will follow her in the outpatient setting.     [name] Resides, NP   ______________________________ [name] Milch, MD    PB/MEDQ  D:  07/05/2010  T:  07/06/2010  Job:  [id number]  Electronically Signed by [name] Resides NP on 07/06/2010 03:34:21 PM Electronically Signed by Cyril Mourning MD on 07/10/2010 09:25:33 AM

## 2010-07-13 ENCOUNTER — Encounter: Payer: Self-pay | Admitting: Pulmonary Disease

## 2010-07-16 ENCOUNTER — Ambulatory Visit (INDEPENDENT_AMBULATORY_CARE_PROVIDER_SITE_OTHER): Payer: Medicare Other | Admitting: Pulmonary Disease

## 2010-07-16 ENCOUNTER — Ambulatory Visit (INDEPENDENT_AMBULATORY_CARE_PROVIDER_SITE_OTHER)
Admission: RE | Admit: 2010-07-16 | Discharge: 2010-07-16 | Disposition: A | Discharge status: Home / Self Care | Payer: Medicare Other | Source: Ambulatory Visit | Attending: Pulmonary Disease | Admitting: Pulmonary Disease

## 2010-07-16 ENCOUNTER — Encounter: Payer: Self-pay | Admitting: Pulmonary Disease

## 2010-07-16 VITALS — BP 134/62 | HR 77 | Temp 98.3°F | Ht 64.0 in | Wt 101.2 lb

## 2010-07-16 DIAGNOSIS — J9819 Other pulmonary collapse: Secondary | ICD-10-CM

## 2010-07-16 DIAGNOSIS — J9811 Atelectasis: Secondary | ICD-10-CM

## 2010-07-16 DIAGNOSIS — J4489 Other specified chronic obstructive pulmonary disease: Secondary | ICD-10-CM

## 2010-07-16 DIAGNOSIS — J449 Chronic obstructive pulmonary disease, unspecified: Secondary | ICD-10-CM

## 2010-07-16 DIAGNOSIS — F17200 Nicotine dependence, unspecified, uncomplicated: Secondary | ICD-10-CM

## 2010-07-16 NOTE — Assessment & Plan Note (Addendum)
Noted on CT 07/02/10 - resolved on CXR 07/16/10 Negative bscopy  ? Related to secretions from pneumonia FU CT chest in 3 months

## 2010-07-16 NOTE — Progress Notes (Signed)
  Subjective:    Patient ID: [name] [name], female    DOB: 09-Aug-1927, 77 y.o.   MRN: [id number]  HPI  PCP - [name] 82/F, smoker, with CAD  adm 5/31-07/05/10 for hypoxia & LLL atelectasis. Ct chest showed Volume loss in the inferior left hemithorax.  Concurrent  filling of the left lower and less so left upper lobe endobronchial  tree.   Bronchoscopy showed LLL endobronchial narrowing, without discrete mass. Papule on rt mainstem bronchus was brushed - cytology all neg. BAL cx, afb, fungal neg 07/16/10 >>Spirometry >>FEV1 61% She feels better, reports pain in her upper back, no cough, hemoptysis CXR - resolved atelecatsis, clearing of infiltrate She has quit smoking since discharge, lives alone, does not want nicotine supplements    Review of Systems  Constitutional: Positive for fever. Negative for unexpected weight change.  HENT: Positive for ear pain, congestion, rhinorrhea, sneezing and postnasal drip. Negative for nosebleeds, sore throat, trouble swallowing, dental problem and sinus pressure.   Eyes: Negative for redness and itching.  Respiratory: Negative for cough, chest tightness, shortness of breath and wheezing.   Cardiovascular: Positive for palpitations and leg swelling.  Gastrointestinal: Negative for nausea and vomiting.  Genitourinary: Negative for dysuria.  Musculoskeletal: Positive for joint swelling.  Skin: Negative for rash.  Neurological: Positive for headaches.  Hematological: Bruises/bleeds easily.  Psychiatric/Behavioral: Negative for dysphoric mood. The patient is not nervous/anxious.        Objective:   Physical Exam Gen. Pleasant, thin, in no distress, normal affect ENT - no lesions, no post nasal drip Neck: No JVD, no thyromegaly, no carotid bruits Lungs: no use of accessory muscles, no dullness to percussion, decreased without rales or rhonchi  Cardiovascular: Rhythm regular, heart sounds  normal, no murmurs or gallops, no peripheral  edema Abdomen: soft and non-tender, no hepatosplenomegaly, BS normal. Musculoskeletal: No deformities, no cyanosis or clubbing Neuro:  alert, non focal          Assessment & Plan:

## 2010-07-16 NOTE — Patient Instructions (Addendum)
CONGRATULATIONS on quitting cigarettes ! Breathing test showed lung capacity at 60% Chest xray today Ct scan in 3 months

## 2010-07-17 ENCOUNTER — Emergency Department (HOSPITAL_COMMUNITY)
Admission: EM | Admit: 2010-07-17 | Discharge: 2010-07-17 | Disposition: A | Discharge status: Home / Self Care | Payer: Medicare Other | Attending: Emergency Medicine | Admitting: Emergency Medicine

## 2010-07-17 ENCOUNTER — Emergency Department (HOSPITAL_COMMUNITY): Payer: Medicare Other

## 2010-07-17 DIAGNOSIS — J449 Chronic obstructive pulmonary disease, unspecified: Secondary | ICD-10-CM | POA: Insufficient documentation

## 2010-07-17 DIAGNOSIS — IMO0002 Reserved for concepts with insufficient information to code with codable children: Secondary | ICD-10-CM | POA: Insufficient documentation

## 2010-07-17 DIAGNOSIS — S8010XA Contusion of unspecified lower leg, initial encounter: Secondary | ICD-10-CM | POA: Insufficient documentation

## 2010-07-17 DIAGNOSIS — M79609 Pain in unspecified limb: Secondary | ICD-10-CM | POA: Insufficient documentation

## 2010-07-17 DIAGNOSIS — I10 Essential (primary) hypertension: Secondary | ICD-10-CM | POA: Insufficient documentation

## 2010-07-17 DIAGNOSIS — I2510 Atherosclerotic heart disease of native coronary artery without angina pectoris: Secondary | ICD-10-CM | POA: Insufficient documentation

## 2010-07-17 LAB — PROTIME-INR
INR: 0.82 (ref 0.00–1.49)
Prothrombin Time: 11.5 seconds — ABNORMAL LOW (ref 11.6–15.2)

## 2010-07-17 LAB — CBC
MCV: 93.7 fL (ref 78.0–100.0)
Platelets: DECREASED 10*3/uL (ref 150–400)
RBC: 4.46 MIL/uL (ref 3.87–5.11)
WBC: 11.3 10*3/uL — ABNORMAL HIGH (ref 4.0–10.5)

## 2010-07-17 LAB — DIFFERENTIAL
Basophils Relative: 0 % (ref 0–1)
Eosinophils Absolute: 0.1 10*3/uL (ref 0.0–0.7)
Lymphocytes Relative: 4 % — ABNORMAL LOW (ref 12–46)
Neutrophils Relative %: 93 % — ABNORMAL HIGH (ref 43–77)

## 2010-07-17 LAB — APTT: aPTT: 26 seconds (ref 24–37)

## 2010-07-17 NOTE — Assessment & Plan Note (Signed)
Trial of spiriva.

## 2010-07-17 NOTE — Assessment & Plan Note (Signed)
COunselled on staing quit High risk for relapse. Discussed pitfalls for relapse.

## 2010-07-21 ENCOUNTER — Ambulatory Visit (INDEPENDENT_AMBULATORY_CARE_PROVIDER_SITE_OTHER): Payer: Medicare Other | Admitting: Internal Medicine

## 2010-07-21 ENCOUNTER — Encounter: Payer: Self-pay | Admitting: Internal Medicine

## 2010-07-21 VITALS — BP 136/72 | HR 64 | Temp 98.3°F | Resp 20 | Ht 64.0 in | Wt 102.0 lb

## 2010-07-21 DIAGNOSIS — T50905A Adverse effect of unspecified drugs, medicaments and biological substances, initial encounter: Secondary | ICD-10-CM | POA: Insufficient documentation

## 2010-07-21 DIAGNOSIS — D696 Thrombocytopenia, unspecified: Secondary | ICD-10-CM

## 2010-07-21 DIAGNOSIS — R61 Generalized hyperhidrosis: Secondary | ICD-10-CM

## 2010-07-21 DIAGNOSIS — I10 Essential (primary) hypertension: Secondary | ICD-10-CM

## 2010-07-21 DIAGNOSIS — T887XXA Unspecified adverse effect of drug or medicament, initial encounter: Secondary | ICD-10-CM

## 2010-07-21 DIAGNOSIS — S8010XA Contusion of unspecified lower leg, initial encounter: Secondary | ICD-10-CM

## 2010-07-21 DIAGNOSIS — J449 Chronic obstructive pulmonary disease, unspecified: Secondary | ICD-10-CM

## 2010-07-21 DIAGNOSIS — I6529 Occlusion and stenosis of unspecified carotid artery: Secondary | ICD-10-CM

## 2010-07-21 NOTE — Patient Instructions (Signed)
Lab in 2 weeks   Cbc diff pt and bmp  Then and decide on follow up Will have contact  Dr Jens Som about what to do with  the Bp med ( hydralazine)  Keep appt   With Dr Jearld Fenton.

## 2010-07-21 NOTE — Progress Notes (Signed)
Subjective:    Patient ID: [name] [name], female    DOB: 1927-09-15, 77 y.o.   MRN: [id number]  HPI  Patient comes in today as a post hospital followup and emergency room followup. Patient was told to come in for followup. Info taken from multiple sources  Probably complete from EHR but convoluted documentation in EHR.    She was admitted to the hospital 531 2 07/05/2010 for acute exacerbation of COPD where she had chest pain and shortness of breath. He was unsure what this was but had a history of significant coronary and vascular disease. The pain was felt to be pleuritic and not cardiac however she was admitted to the intensive care unit on noninvasive PPV treated with antibiotics bronchodilators and steroids. She had a full infection workup including a fiberoptic bronchoscopy for some abnormality of atelectasis on her imaging studies. The findings were benign.  Since her hospitalization she has been tobacco free and has declined the nicotine patches .  She is still having some coughing and what she calls bleeding but she is unsure if it's from her sinuses are coughing up.    Since her discharge she is seeing Dr. Vassie Loll      And tobacco free. To be seen in August .  In the meantime she had discontinuation of her beta blocker because of her COPD. Because her blood pressure then elevated she was placed on hydralazine 25 mg 3 times a day on voice order over the phone from cardiology. However she states it makes her feel bad and hasn't been taking it regularly. Hydralazine  Causes  eleved pulses  So taking it prn.     One yesterday and toay.   Was doong better on atenolol.  Her blood pressure readings are recorded and she brings him in today. They're in the 140-170 range with an occasional lower one. To go to see Dr. Jearld Fenton.  Tomorrow.   In the meantime on 6/15 while gardening. Rate handle hit the metal of her left lower extremity and caused pain and bleeding. She went to the emergency room and  they x-rayed her lower extremity and showed no fracture. However a blood count showed clumpy platelets question decreased. She was told to look for signs of infection and come in to see [place] to followup her platelets.  She denies bony tenderness or falling   reviewed  Log   hosp summary  Review of Systems Still has night sweats no change in weight no fever... no change in appetite no other bleeding  Past history family history social history reviewed in the electronic medical record. .    Objective:   Physical Exam Slender but well-developed alert oriented articulate in no acute distress. Occasional cough HEENT: Normocephalic ;atraumatic , Eyes;  PERRL, EOMs  Full, lids and conjunctiva clear,,Ears: no deformities, canals nl, TM landmarks normal, Nose: no deformity or discharge  Mouth : OP clear without lesion or edema . Neck no masses Chest a few large airway sounds no wheezes.  Cardiac S1-S2 no gallops no murmurs are heard    Extremities no edema or clubbing Bruising over a large amount of her left medial lower extremity in the dependent area the skin is intact and there is no abscess redness streaking or infection. There is some bruising on the middle toes of her foot and some slight swelling but no bony tenderness at her ankle.  Rest of her skin shows no bleeding or petechiae. Oriented x 3 and no noted deficits  in memory, attention, and speech.   Assessment & Plan:  BP  HT    Se of meds    ? What to do next taken off b blocker when had been doing well with this  .  States she is unsure she can take the hydralazine as directed agree we need to redirect the plan. As she is Afraid to take medication currently. I recommend she contact cardiology and we will send this current note to have them give advice about further interventions.Erline Hau / COPD    problematic but apparently stable.   Important that she    Remain  Tobacco free.  Sinuses and question hemoptysis problem to see Dr. Jearld Fenton  agreed thats he should keep the appointment  Large contusion and ecchymosis on left lower extremity  she has very little subcutaneous tissue or padding. Discussed protection of her legs when she is out of round. At this time no sign of infection.    Possible decreased platelets   reviewed CBC from emergency room done 4 days ago there is no anemia. We'll recheck some laboratory studies in about 2 weeks. I don't see any other evidence of a bleeding disorder at this time.     CAD PVD    Time of collection  Of data and  review and discussion   Total visit 45 mins > 50% spent counseling and coordinating care

## 2010-07-22 ENCOUNTER — Telehealth: Payer: Self-pay | Admitting: Cardiology

## 2010-07-22 NOTE — Telephone Encounter (Signed)
Pt calling re having problem w/ hydralazine 25 mg makes her heart pound since she started taking it  two weeks ago and wants to know why med was changed in the first place? Not taking med as prescirbed due to the pounding

## 2010-07-23 NOTE — Telephone Encounter (Signed)
Spoke with pt, pt aware to restart atenolol. She will monitor her bp and pulse and let us know of any problems. [name] [name]

## 2010-07-23 NOTE — Telephone Encounter (Signed)
Resume atenolol 25 daily and follow BP/HR Olga Millers

## 2010-07-23 NOTE — Telephone Encounter (Signed)
Spoke with pt, she is unable to tolerate the hydralazine. She had dizziness, shakes and it made her heart pound. She has stopped taking it. She wants to know if she can start back on the atenolol and does not understand why it was stopped. According to the office note by scott weaver pac in 12/11 the pts atenolol was decreased due to bradycardia. i can not find where it was stopped. Will forward for dr Jens Som review [name] [name]

## 2010-07-24 ENCOUNTER — Inpatient Hospital Stay: Payer: Medicare Other | Admitting: Pulmonary Disease

## 2010-07-31 LAB — FUNGUS CULTURE W SMEAR: Fungal Smear: NONE SEEN

## 2010-08-03 ENCOUNTER — Inpatient Hospital Stay: Payer: Medicare Other | Admitting: Pulmonary Disease

## 2010-08-10 ENCOUNTER — Other Ambulatory Visit (INDEPENDENT_AMBULATORY_CARE_PROVIDER_SITE_OTHER): Payer: Medicare Other

## 2010-08-10 DIAGNOSIS — D72829 Elevated white blood cell count, unspecified: Secondary | ICD-10-CM

## 2010-08-10 DIAGNOSIS — I10 Essential (primary) hypertension: Secondary | ICD-10-CM

## 2010-08-10 DIAGNOSIS — D696 Thrombocytopenia, unspecified: Secondary | ICD-10-CM

## 2010-08-10 LAB — CBC WITH DIFFERENTIAL/PLATELET
Basophils Absolute: 0 10*3/uL (ref 0.0–0.1)
Basophils Relative: 0.7 % (ref 0.0–3.0)
Eosinophils Absolute: 0.2 10*3/uL (ref 0.0–0.7)
Hemoglobin: 13.4 g/dL (ref 12.0–15.0)
Lymphocytes Relative: 13.9 % (ref 12.0–46.0)
MCHC: 34.1 g/dL (ref 30.0–36.0)
Monocytes Relative: 8 % (ref 3.0–12.0)
Neutrophils Relative %: 74.2 % (ref 43.0–77.0)
RBC: 4.22 Mil/uL (ref 3.87–5.11)

## 2010-08-10 LAB — PROTIME-INR: INR: 0.9 ratio (ref 0.8–1.0)

## 2010-08-10 LAB — BASIC METABOLIC PANEL
CO2: 25 mEq/L (ref 19–32)
Calcium: 9 mg/dL (ref 8.4–10.5)
GFR: 65.6 mL/min (ref >60.00)
Potassium: 4 mEq/L (ref 3.5–5.1)
Sodium: 142 mEq/L (ref 135–145)

## 2010-08-14 ENCOUNTER — Encounter: Payer: Self-pay | Admitting: *Deleted

## 2010-08-16 LAB — AFB CULTURE WITH SMEAR (NOT AT ARMC): Acid Fast Smear: NONE SEEN

## 2010-08-17 ENCOUNTER — Encounter: Payer: Self-pay | Admitting: Cardiovascular Disease

## 2010-09-08 ENCOUNTER — Other Ambulatory Visit: Payer: Self-pay | Admitting: Internal Medicine

## 2010-09-15 ENCOUNTER — Encounter: Payer: Self-pay | Admitting: Cardiovascular Disease

## 2010-09-15 ENCOUNTER — Ambulatory Visit (INDEPENDENT_AMBULATORY_CARE_PROVIDER_SITE_OTHER): Payer: Medicare Other | Admitting: Cardiovascular Disease

## 2010-09-15 VITALS — BP 114/56 | HR 64 | Ht 64.0 in | Wt 102.1 lb

## 2010-09-15 DIAGNOSIS — I739 Peripheral vascular disease, unspecified: Secondary | ICD-10-CM

## 2010-09-15 DIAGNOSIS — I70219 Atherosclerosis of native arteries of extremities with intermittent claudication, unspecified extremity: Secondary | ICD-10-CM

## 2010-09-15 NOTE — Patient Instructions (Signed)
Your physician wants you to follow-up in: 12 months with Dr. Excell Seltzer. You will receive a reminder letter in the mail two months in advance. If you don't receive a letter, please call our office to schedule the follow-up appointment.  Your physician has requested that you have an ankle brachial index (ABI). During this test an ultrasound and blood pressure cuff are used to evaluate the arteries that supply the arms and legs with blood. Allow thirty minutes for this exam. There are no restrictions or special instructions. Please schedule on day of appointment with Dr. Elenor Quinones. 10, 2012

## 2010-09-15 NOTE — Assessment & Plan Note (Signed)
The patient has stable, moderately lifestyle limiting left leg claudication. This correlates with her known long total SFA occlusion. I don't think she has good revascularization options that she is not a good candidate for peripheral bypass surgery and her symptoms probably do not warrant this. Her anatomy is unfavorable for PTA and stenting. She will continue with medical management and I would like to see her back in one year. She will have followup ABIs performed to make sure that these are not declining.

## 2010-09-15 NOTE — Progress Notes (Signed)
HPI:  This is an 77 year old woman presenting for followup evaluation. The patient is followed for lower extremity peripheral arterial disease. She has had previous iliac stenting. She has lifestyle limiting left leg claudication. This is her biggest complaint with respect to lifestyle limitation. She still tries to mow her lawn but has difficulty with this and has to stop to rest because of left calf cramping and aching. She denies rest pain. She's had no ulcerations. Overall the pain is stable. She denies any right leg pain.  The patient has known peripheral arterial disease and she had an angiogram performed in 2010. 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 She has very small arteries and her anatomy was very unfavorable for PTA and stenting. She has been treated medically. Her right leg ABI has been in the normal range and her left leg ABI has been approximately 0.6.  Outpatient Encounter Prescriptions as of 09/15/2010  Medication Sig Dispense Refill  . amLODipine (NORVASC) 10 MG tablet TAKE ONE TABLET BY MOUTH ONE TIME DAILY  90 tablet  2  . aspirin 81 MG EC tablet Take 81 mg by mouth daily.        Marland Kitchen atenolol (TENORMIN) 50 MG tablet Take 25 mg by mouth daily.        . Cholecalciferol (VITAMIN D) 1000 UNITS capsule Take 1,000 Units by mouth daily.        . fish oil-omega-3 fatty acids 1000 MG capsule Take 2 g by mouth daily.        Marland Kitchen guaiFENesin (MUCINEX) 600 MG 12 hr tablet Take 600 mg by mouth as needed.        . isosorbide mononitrate (IMDUR) 60 MG 24 hr tablet TAKE ONE TABLET BY MOUTH ONE TIME DAILY  90 tablet  0  . nitroGLYCERIN (NITROSTAT) 0.4 MG SL tablet Place 0.4 mg under the tongue every 5 (five) minutes as needed.        . sodium chloride (OCEAN) 0.65 % nasal spray Place 1 spray into the nose as needed.        . traMADol (ULTRAM) 50 MG tablet TAKE TWO TABLETS BY MOUTH EVERY SIX HOURS AS NEEDED  30 tablet  0  . VOLTAREN 1 % GEL APPLY 2 GRAMS TO UPPER  EXTREMITIES UP TO 4 TIMES DAILY FOR ARTHRITISPAIN.MAX 8 GM PER DAY PER JOINT.  100 g  1  . DISCONTD: hydrALAZINE (APRESOLINE) 25 MG tablet Take 1 tablet (25 mg total) by mouth 3 (three) times daily.  90 tablet  11  . DISCONTD: levalbuterol (XOPENEX HFA) 45 MCG/ACT inhaler Inhale 1-2 puffs into the lungs every 4 (four) hours as needed.          Allergies  Allergen Reactions  . Albuterol     Shakes   . Nicoderm (Nicotine)     GI Upset  . Oxycodone   . Prednisone     Shakes  . Tequin   . Verapamil     REACTION: CONSTIPATION    Past Medical History  Diagnosis Date  . POSTHERPETIC NEURALGIA 08/25/2006  . CARCINOMA, BASAL CELL 02/13/2006  . NEOPLASM, SKIN, UNCERTAIN BEHAVIOR 12/16/2008  . HYPOTHYROIDISM 01/30/2007  . Unspecified vitamin D deficiency 02/07/2007  . HYPERLIPIDEMIA 03/10/2008  . UNSPECIFIED ANEMIA 03/18/2008  . THROMBOCYTOPENIA 07/30/2008  . TOBACCO USE 09/27/2006  . POST-TRAUMATIC HEADACHE UNSPECIFIED 01/17/2008  . LOSS, HEARING NOS 08/25/2006  . HYPERTENSION 02/13/2006  . UNSTABLE ANGINA 12/12/2009  . CORONARY ARTERY DISEASE 02/13/2006  .  CAROTID ARTERY DISEASE 10/03/2006  . RENAL ARTERY STENOSIS 08/01/2008  . PERIPHERAL VASCULAR DISEASE 02/13/2006  . Chronic rhinitis 12/13/2006  . CHRONIC OBSTRUCTIVE PULMONARY DISEASE, ACUTE EXACERBATION 11/17/2009  . COPD 11/21/2006  . TMJ PAIN 08/25/2006  . Unspecified disorder of kidney and ureter 09/17/2008  . DERMATITIS 09/25/2007  . DEGENERATIVE JOINT DISEASE 09/27/2006  . OSTEOARTHRITIS, HAND 12/16/2008  . SPINAL STENOSIS 11/01/2006  . Pain in limb 09/27/2006  . INSOMNIA 04/05/2008  . FATIGUE 09/08/2007  . WEIGHT LOSS 06/11/2008  . Underweight 01/17/2008  . FEMORAL BRUIT, RIGHT 01/02/2010  . Shortness of breath 07/31/2008  . Cough 11/05/2009  . GROIN PAIN 06/11/2008  . LIVER FUNCTION TESTS, ABNORMAL 01/22/2009  . UNS ADVRS EFF UNS RX MEDICINAL&BIOLOGICAL SBSTNC 01/30/2007  . Personal history of malignant neoplasm of breast 07/24/2008  .  HEMATURIA, MICROSCOPIC, HX OF 02/13/2006  . ACCIDENTAL FALL, HX OF 06/11/2008  . HIP PAIN 02/26/2010  . NEPHROLITHIASIS, HX OF 02/27/2010  . Vitreous detachment   . Shingles     recurrent  . Head trauma     closed  . Personal history of urinary calculi     ROS: Negative except as per HPI  BP 114/56  Pulse 64  Ht 5\' 4"  (1.626 m)  Wt 102 lb 1.9 oz (46.321 kg)  BMI 17.53 kg/m2  PHYSICAL EXAM: Pt is alert and oriented, very thin elderly woman in NAD HEENT: normal Neck: JVP - normal, carotids 2+= with bilateral bruits Lungs: CTA bilaterally CV: RRR without murmur or gallop Abd: soft, NT, Positive BS, no hepatomegaly Ext: no C/C/E, bilateral femoral pulses are 2+, right posterior tibial and DP pulses are 2+, left posterior tibial and DP pulses are nonpalpable. Skin: warm/dry multiple superficial ecchymoses noted  ASSESSMENT AND PLAN:

## 2010-09-18 ENCOUNTER — Ambulatory Visit (INDEPENDENT_AMBULATORY_CARE_PROVIDER_SITE_OTHER): Payer: Medicare Other | Admitting: Pulmonary Disease

## 2010-09-18 ENCOUNTER — Encounter: Payer: Self-pay | Admitting: Pulmonary Disease

## 2010-09-18 VITALS — BP 110/70 | HR 63 | Temp 98.0°F | Ht 64.0 in | Wt 104.0 lb

## 2010-09-18 DIAGNOSIS — J9819 Other pulmonary collapse: Secondary | ICD-10-CM

## 2010-09-18 DIAGNOSIS — J449 Chronic obstructive pulmonary disease, unspecified: Secondary | ICD-10-CM

## 2010-09-18 DIAGNOSIS — J9811 Atelectasis: Secondary | ICD-10-CM

## 2010-09-18 NOTE — Progress Notes (Signed)
  Subjective:    Patient ID: [name] [name], female    DOB: 05-23-1927, 77 y.o.   MRN: [id number]  HPI PCP - [name]  82/F, smoker, with CAD, PVD  adm 5/31-07/05/10 for hypoxia & LLL atelectasis.  Ct chest showed Volume loss in the inferior left hemithorax. Concurrent filling of the left lower and less so left upper lobe endobronchial  tree.  Bronchoscopy showed LLL endobronchial narrowing, without discrete mass. Papule on rt mainstem bronchus was brushed - cytology all neg. BAL cx, afb, fungal neg  07/16/10 >>Spirometry >>FEV1 61%  CXR - resolved atelecatsis, clearing of infiltrate  She has quit smoking since discharge, lives alone, does not want nicotine supplements  09/18/2010 Remains quit Painting at home, smell of smoke has gone Trial of spiriva - helped, but does not need them She feels better, reports pain in her upper back, no cough, hemoptysis    Review of Systems Patient denies significant dyspnea,cough, hemoptysis,  chest pain, palpitations, pedal edema, orthopnea, paroxysmal nocturnal dyspnea, lightheadedness, nausea, vomiting, abdominal or  leg pains  C/o back pain     Objective:   Physical Exam Gen. Pleasant, well-nourished, in no distress ENT - no lesions, no post nasal drip Neck: No JVD, no thyromegaly, no carotid bruits Lungs: no use of accessory muscles, no dullness to percussion, decreased lt base without rales or rhonchi  Cardiovascular: Rhythm regular, heart sounds  normal, no murmurs or gallops, no peripheral edema Musculoskeletal: No deformities, no cyanosis or clubbing         Assessment & Plan:

## 2010-09-18 NOTE — Assessment & Plan Note (Signed)
OK to use spiriva prn Vaccinations uptodate

## 2010-09-18 NOTE — Patient Instructions (Addendum)
Sample of spiriva Repeat CT scan of the chest in 1 month

## 2010-09-18 NOTE — Assessment & Plan Note (Signed)
With decreased BS lt base & difficult to interpret CXR due to severe scoliosis, will proceed with CT in 1 month

## 2010-09-20 ENCOUNTER — Inpatient Hospital Stay (HOSPITAL_COMMUNITY): Payer: Medicare Other

## 2010-09-20 ENCOUNTER — Inpatient Hospital Stay (HOSPITAL_COMMUNITY)
Admission: EM | Admit: 2010-09-20 | Discharge: 2010-09-24 | DRG: 193 | Disposition: A | Discharge status: Home / Self Care | Payer: Medicare Other | Source: Ambulatory Visit | Attending: Pulmonary Disease | Admitting: Pulmonary Disease

## 2010-09-20 ENCOUNTER — Emergency Department (HOSPITAL_COMMUNITY): Payer: Medicare Other

## 2010-09-20 DIAGNOSIS — Z7982 Long term (current) use of aspirin: Secondary | ICD-10-CM

## 2010-09-20 DIAGNOSIS — Z87891 Personal history of nicotine dependence: Secondary | ICD-10-CM

## 2010-09-20 DIAGNOSIS — J189 Pneumonia, unspecified organism: Principal | ICD-10-CM | POA: Diagnosis present

## 2010-09-20 DIAGNOSIS — J4489 Other specified chronic obstructive pulmonary disease: Secondary | ICD-10-CM

## 2010-09-20 DIAGNOSIS — J13 Pneumonia due to Streptococcus pneumoniae: Secondary | ICD-10-CM

## 2010-09-20 DIAGNOSIS — J9600 Acute respiratory failure, unspecified whether with hypoxia or hypercapnia: Secondary | ICD-10-CM | POA: Diagnosis present

## 2010-09-20 DIAGNOSIS — R7309 Other abnormal glucose: Secondary | ICD-10-CM | POA: Diagnosis present

## 2010-09-20 DIAGNOSIS — R0902 Hypoxemia: Secondary | ICD-10-CM | POA: Diagnosis present

## 2010-09-20 DIAGNOSIS — Z886 Allergy status to analgesic agent status: Secondary | ICD-10-CM

## 2010-09-20 DIAGNOSIS — Z888 Allergy status to other drugs, medicaments and biological substances status: Secondary | ICD-10-CM

## 2010-09-20 DIAGNOSIS — I10 Essential (primary) hypertension: Secondary | ICD-10-CM | POA: Diagnosis present

## 2010-09-20 DIAGNOSIS — Z792 Long term (current) use of antibiotics: Secondary | ICD-10-CM

## 2010-09-20 DIAGNOSIS — J441 Chronic obstructive pulmonary disease with (acute) exacerbation: Secondary | ICD-10-CM | POA: Diagnosis present

## 2010-09-20 DIAGNOSIS — J9819 Other pulmonary collapse: Secondary | ICD-10-CM | POA: Diagnosis present

## 2010-09-20 DIAGNOSIS — J449 Chronic obstructive pulmonary disease, unspecified: Secondary | ICD-10-CM

## 2010-09-20 LAB — BASIC METABOLIC PANEL
GFR calc Af Amer: >60 mL/min (ref >60)
GFR calc non Af Amer: >60 mL/min (ref >60)
Glucose, Bld: 150 mg/dL — ABNORMAL HIGH (ref 70–99)
Potassium: 3.8 mEq/L (ref 3.5–5.1)
Sodium: 134 mEq/L — ABNORMAL LOW (ref 135–145)

## 2010-09-20 LAB — COMPREHENSIVE METABOLIC PANEL
Alkaline Phosphatase: 148 U/L — ABNORMAL HIGH (ref 39–117)
BUN: 19 mg/dL (ref 6–23)
CO2: 25 mEq/L (ref 19–32)
Chloride: 102 mEq/L (ref 96–112)
GFR calc Af Amer: >60 mL/min (ref >60)
GFR calc non Af Amer: >60 mL/min (ref >60)
Glucose, Bld: 129 mg/dL — ABNORMAL HIGH (ref 70–99)
Potassium: 3.8 mEq/L (ref 3.5–5.1)
Total Bilirubin: 0.8 mg/dL (ref 0.3–1.2)

## 2010-09-20 LAB — DIFFERENTIAL
Basophils Absolute: 0 10*3/uL (ref 0.0–0.1)
Lymphocytes Relative: 7 % — ABNORMAL LOW (ref 12–46)
Lymphs Abs: 0.8 10*3/uL (ref 0.7–4.0)
Neutro Abs: 9.3 10*3/uL — ABNORMAL HIGH (ref 1.7–7.7)

## 2010-09-20 LAB — POCT I-STAT 3, ART BLOOD GAS (G3+)
Acid-base deficit: 1 mmol/L (ref 0.0–2.0)
Bicarbonate: 23.1 mEq/L (ref 20.0–24.0)
O2 Saturation: 85 %
Patient temperature: 98.6
TCO2: 24 mmol/L (ref 0–100)
pCO2 arterial: 36.9 mmHg (ref 35.0–45.0)
pH, Arterial: 7.404 — ABNORMAL HIGH (ref 7.350–7.400)
pO2, Arterial: 49 mmHg — ABNORMAL LOW (ref 80.0–100.0)

## 2010-09-20 LAB — CK TOTAL AND CKMB (NOT AT ARMC): Total CK: 45 U/L (ref 7–177)

## 2010-09-20 LAB — BASIC METABOLIC PANEL WITH GFR
BUN: 20 mg/dL (ref 6–23)
CO2: 23 meq/L (ref 19–32)
Calcium: 9.2 mg/dL (ref 8.4–10.5)
Chloride: 101 meq/L (ref 96–112)
Creatinine, Ser: 0.81 mg/dL (ref 0.50–1.10)

## 2010-09-20 LAB — LACTIC ACID, PLASMA: Lactic Acid, Venous: 1.3 mmol/L (ref 0.5–2.2)

## 2010-09-20 LAB — CBC
HCT: 37.1 % (ref 36.0–46.0)
Hemoglobin: 12.4 g/dL (ref 12.0–15.0)
MCV: 91.8 fL (ref 78.0–100.0)
WBC: 11.3 10*3/uL — ABNORMAL HIGH (ref 4.0–10.5)

## 2010-09-20 LAB — PRO B NATRIURETIC PEPTIDE: Pro B Natriuretic peptide (BNP): 1961 pg/mL — ABNORMAL HIGH (ref 0–450)

## 2010-09-20 LAB — GLUCOSE, CAPILLARY: Glucose-Capillary: 160 mg/dL — ABNORMAL HIGH (ref 70–99)

## 2010-09-20 LAB — PROTIME-INR
INR: 1.04 (ref 0.00–1.49)
Prothrombin Time: 13.8 seconds (ref 11.6–15.2)

## 2010-09-20 LAB — APTT: aPTT: 32 s (ref 24–37)

## 2010-09-20 LAB — CARDIAC PANEL(CRET KIN+CKTOT+MB+TROPI): Troponin I: <0.3 ng/mL (ref <0.30)

## 2010-09-20 LAB — MRSA PCR SCREENING: MRSA by PCR: NEGATIVE

## 2010-09-21 ENCOUNTER — Inpatient Hospital Stay (HOSPITAL_COMMUNITY): Payer: Medicare Other

## 2010-09-21 LAB — URINALYSIS, ROUTINE W REFLEX MICROSCOPIC
Glucose, UA: NEGATIVE mg/dL
Leukocytes, UA: NEGATIVE
Specific Gravity, Urine: 1.016 (ref 1.005–1.030)
pH: 5.5 (ref 5.0–8.0)

## 2010-09-21 LAB — BASIC METABOLIC PANEL
BUN: 21 mg/dL (ref 6–23)
CO2: 22 mEq/L (ref 19–32)
Calcium: 9.4 mg/dL (ref 8.4–10.5)
GFR calc non Af Amer: >60 mL/min (ref >60)
Glucose, Bld: 175 mg/dL — ABNORMAL HIGH (ref 70–99)
Potassium: 3.1 mEq/L — ABNORMAL LOW (ref 3.5–5.1)
Sodium: 139 mEq/L (ref 135–145)

## 2010-09-21 LAB — URINE MICROSCOPIC-ADD ON

## 2010-09-21 LAB — CARDIAC PANEL(CRET KIN+CKTOT+MB+TROPI)
CK, MB: 3.5 ng/mL (ref 0.3–4.0)
Total CK: 51 U/L (ref 7–177)

## 2010-09-21 LAB — CBC
Hemoglobin: 12.6 g/dL (ref 12.0–15.0)
MCH: 31 pg (ref 26.0–34.0)
MCHC: 34.4 g/dL (ref 30.0–36.0)
RDW: 13 % (ref 11.5–15.5)

## 2010-09-21 LAB — GLUCOSE, CAPILLARY: Glucose-Capillary: 160 mg/dL — ABNORMAL HIGH (ref 70–99)

## 2010-09-22 ENCOUNTER — Inpatient Hospital Stay (HOSPITAL_COMMUNITY): Payer: Medicare Other

## 2010-09-22 DIAGNOSIS — J13 Pneumonia due to Streptococcus pneumoniae: Secondary | ICD-10-CM

## 2010-09-22 DIAGNOSIS — J9600 Acute respiratory failure, unspecified whether with hypoxia or hypercapnia: Secondary | ICD-10-CM

## 2010-09-22 DIAGNOSIS — J449 Chronic obstructive pulmonary disease, unspecified: Secondary | ICD-10-CM

## 2010-09-22 DIAGNOSIS — J9819 Other pulmonary collapse: Secondary | ICD-10-CM

## 2010-09-22 LAB — URINE CULTURE
Colony Count: NO GROWTH
Culture: NO GROWTH

## 2010-09-23 ENCOUNTER — Other Ambulatory Visit: Payer: Self-pay | Admitting: Pulmonary Disease

## 2010-09-23 ENCOUNTER — Inpatient Hospital Stay (HOSPITAL_COMMUNITY): Payer: Medicare Other

## 2010-09-23 LAB — BASIC METABOLIC PANEL
Calcium: 9.2 mg/dL (ref 8.4–10.5)
GFR calc Af Amer: >60 mL/min (ref >60)
GFR calc non Af Amer: >60 mL/min (ref >60)
Potassium: 3.9 mEq/L (ref 3.5–5.1)
Sodium: 139 mEq/L (ref 135–145)

## 2010-09-23 LAB — CBC
MCH: 31.7 pg (ref 26.0–34.0)
MCHC: 33.7 g/dL (ref 30.0–36.0)
Platelets: 163 10*3/uL (ref 150–400)
RDW: 13.7 % (ref 11.5–15.5)

## 2010-09-24 ENCOUNTER — Inpatient Hospital Stay (HOSPITAL_COMMUNITY): Payer: Medicare Other

## 2010-09-24 DIAGNOSIS — J449 Chronic obstructive pulmonary disease, unspecified: Secondary | ICD-10-CM

## 2010-09-24 DIAGNOSIS — J13 Pneumonia due to Streptococcus pneumoniae: Secondary | ICD-10-CM

## 2010-09-24 DIAGNOSIS — J9819 Other pulmonary collapse: Secondary | ICD-10-CM

## 2010-09-24 DIAGNOSIS — J9600 Acute respiratory failure, unspecified whether with hypoxia or hypercapnia: Secondary | ICD-10-CM

## 2010-09-25 LAB — CULTURE, RESPIRATORY W GRAM STAIN

## 2010-09-26 LAB — CULTURE, BLOOD (ROUTINE X 2): Culture  Setup Time: 201208192158

## 2010-09-28 ENCOUNTER — Ambulatory Visit (INDEPENDENT_AMBULATORY_CARE_PROVIDER_SITE_OTHER)
Admission: RE | Admit: 2010-09-28 | Discharge: 2010-09-28 | Disposition: A | Discharge status: Home / Self Care | Payer: Medicare Other | Source: Ambulatory Visit | Attending: Adult Health | Admitting: Adult Health

## 2010-09-28 ENCOUNTER — Encounter: Payer: Self-pay | Admitting: Adult Health

## 2010-09-28 ENCOUNTER — Ambulatory Visit (INDEPENDENT_AMBULATORY_CARE_PROVIDER_SITE_OTHER): Payer: Medicare Other | Admitting: Adult Health

## 2010-09-28 ENCOUNTER — Telehealth: Payer: Self-pay | Admitting: Pulmonary Disease

## 2010-09-28 DIAGNOSIS — J9819 Other pulmonary collapse: Secondary | ICD-10-CM

## 2010-09-28 DIAGNOSIS — J449 Chronic obstructive pulmonary disease, unspecified: Secondary | ICD-10-CM

## 2010-09-28 DIAGNOSIS — R9389 Abnormal findings on diagnostic imaging of other specified body structures: Secondary | ICD-10-CM

## 2010-09-28 DIAGNOSIS — R918 Other nonspecific abnormal finding of lung field: Secondary | ICD-10-CM

## 2010-09-28 DIAGNOSIS — J8410 Pulmonary fibrosis, unspecified: Secondary | ICD-10-CM

## 2010-09-28 DIAGNOSIS — J9811 Atelectasis: Secondary | ICD-10-CM

## 2010-09-28 MED ORDER — MOMETASONE FURO-FORMOTEROL FUM 200-5 MCG/ACT IN AERO: 2.0000 | INHALATION_SPRAY | Freq: Two times a day (BID) | RESPIRATORY_TRACT | Status: DC

## 2010-09-28 MED ORDER — TIOTROPIUM BROMIDE MONOHYDRATE 18 MCG IN CAPS: 18.0000 ug | ORAL_CAPSULE | Freq: Every day | RESPIRATORY_TRACT | Status: DC

## 2010-09-28 MED ORDER — LEVALBUTEROL HCL 0.63 MG/3ML IN NEBU
0.6300 mg | INHALATION_SOLUTION | Freq: Once | RESPIRATORY_TRACT | Status: AC
  Administered 2010-09-28: 0.63 mg via RESPIRATORY_TRACT

## 2010-09-28 NOTE — Progress Notes (Signed)
Addended by: Tommie Sams on: 09/28/2010 04:34 PM   Modules accepted: Orders

## 2010-09-28 NOTE — Progress Notes (Signed)
Subjective:    Patient ID: [name] [name], female    DOB: 11/12/1927, 77 y.o.   MRN: [id number]  HPI  PCP - [name]  82/F, smoker, with CAD, PVD  adm 5/31-07/05/10 for hypoxia & LLL atelectasis.  Ct chest showed Volume loss in the inferior left hemithorax. Concurrent filling of the left lower and less so left upper lobe endobronchial  tree.  Bronchoscopy showed LLL endobronchial narrowing, without discrete mass. Papule on rt mainstem bronchus was brushed - cytology all neg. BAL cx, afb, fungal neg  07/16/10 >>Spirometry >>FEV1 61%  CXR - resolved atelecatsis, clearing of infiltrate  She has quit smoking since discharge, lives alone, does not want nicotine supplements  09/18/2010 Remains quit Painting at home, smell of smoke has gone Trial of spiriva - helped, but does not need them She feels better, reports pain in her upper back, no cough, hemoptysis  >>>>rx Spirva   09/28/2010 Acute OV  Pt presents for an acute work in. Complains of sore throat, nose burning, nose feels raw, nauseated, chest congestion at night, cough w/ yellow phlem w/ streaks of blood, headache for last 3 days. Says she can not tolerate Avelox it is causing severe nausea.  She is not taking Spriiva as recommended at prev OV. NOt currently on any inhalers or nebs. No edema , vomitting or choking.   Pt was recently admitted 8/19-8/23  for AECOPD and LLL atx collapse. Cx neg. She was tx with aggressive pulm hygiene w/ nebs, o2, IV abx and steroids. She has had recurent LLL collapse w/ FOB earlier this year with neg cytology. Pt underwent FOB this admit which noted thick mucoid secretions with difficulty passing scope. Cytology was neg for malignancy . Path showed  AMYLOID DEPOSITION AND DYSTROPHIC  CALCIFICATIONS.Marland Kitchen She was tx aggressively with ABX for possible post obstructive PNA , discharged on Avelox for total of 10 days.   Says she is not smoking but smells of strong cig odor    Review of  Systems  Constitutional:   No  weight loss, night sweats,  + Fevers, chills, fatigue, or  lassitude.  HEENT:   No headaches,  Difficulty swallowing,  Tooth/dental problems, or  Sore throat,                No sneezing, itching, ear ache, nasal congestion, post nasal drip,   CV:  No chest pain,  Orthopnea, PND, swelling in lower extremities, anasarca, dizziness, palpitations, syncope.   GI  No heartburn, indigestion, abdominal pain, nausea, vomiting, diarrhea, change in bowel habits, loss of appetite, bloody stools.   Resp: + shortness of breath with exertion or at rest. + excess mucus, no productive cough,    No coughing up of blood.     No wheezing.  No chest wall deformity  Skin: no rash or lesions.  GU: no dysuria, change in color of urine, no urgency or frequency.  No flank pain, no hematuria   MS:  No joint pain or swelling.  No decreased range of motion.  No back pain.  Psych:  No change in mood or affect. No depression or anxiety.  No memory loss.        Objective:   Physical Exam  GEN: A/Ox3; pleasant , NAD, thin cachexic appearing.   HEENT:  [place]/AT,  EACs-clear, TMs-wnl, NOSE-clear, THROAT-clear, no lesions, no postnasal drip or exudate noted.   NECK:  Supple w/ fair ROM; no JVD; normal carotid impulses w/o bruits; no thyromegaly or nodules palpated; no  lymphadenopathy.  RESP  Clear  P & A; w/o, wheezes/ rales/ or rhonchi.no accessory muscle use, no dullness to percussion  CARD:  RRR, no m/r/g  , no peripheral edema, pulses intact, no cyanosis or clubbing.  GI:   Soft & nt; nml bowel sounds; no organomegaly or masses detected.  Musco: Warm bil, no deformities or joint swelling noted.   Neuro: alert, no focal deficits noted.    Skin: Warm, no lesions or rashes        Assessment & Plan:

## 2010-09-28 NOTE — Telephone Encounter (Signed)
Spoke with pt and she c/o sore throat, nasal congestion, low grade fever 99.8. Pt is scheduled to come in and see TP today. Pt will call back if she can't make this apt

## 2010-09-28 NOTE — Patient Instructions (Signed)
Stop Avelox  BEgin Augmentin 875mg  Twice daily  For 7 days -take with food, eat yogurt daily  Fluids and rest.  Restart Spiriva daily -1 puff  Begin Dulera 2 puffs Twice daily   Mucinex DM Twice daily  As needed  Cough/congestion  Flutter valve (green tube) Three times a day  As needed  For congestion  Saline nasal rinses As needed   Please contact office for sooner follow up if symptoms do not improve or worsen or seek emergency care  follow up Dr. Vassie Loll  In 2 weeks and As needed   I will call with xray results

## 2010-09-28 NOTE — Assessment & Plan Note (Signed)
Slow to resolve exacerbation complicated by sinusitis, med intolerance and LLL atx collapse Hospital records reviewed w/ PATH neg for malignancy. FOB showing mucoid plugging.  Need to increased her pulmonary hygiene , as she is not on any inhalers or nebs.  Encouraged on flutter valve. Smoking cesstation.  Will hold on nebs for now as pt is resistant, add MDI w/ pt educaiton  Check xray.   Plan:  Stop Avelox  BEgin Augmentin 875mg  Twice daily  For 7 days -take with food, eat yogurt daily  Fluids and rest.  Restart Spiriva daily -1 puff  Begin Dulera 2 puffs Twice daily   Mucinex DM Twice daily  As needed  Cough/congestion  Flutter valve (green tube) Three times a day  As needed  For congestion  Saline nasal rinses As needed   Please contact office for sooner follow up if symptoms do not improve or worsen or seek emergency care  follow up Dr. Vassie Loll  In 2 weeks and As needed   I will call with xray results

## 2010-09-28 NOTE — Progress Notes (Signed)
Addended by: Tommie Sams on: 09/28/2010 03:42 PM   Modules accepted: Orders

## 2010-09-30 ENCOUNTER — Telehealth: Payer: Self-pay | Admitting: Adult Health

## 2010-09-30 NOTE — Telephone Encounter (Signed)
TP ordered Augmentin 875 mg twice daily x7 days with food and eat yogurt daily. I called pharmacy and gave verbal because order was not sent yesterday. Carron Curie, CMA

## 2010-10-05 NOTE — Discharge Summary (Signed)
NAMEFRANCESS, MULLEN NO.:  [id number]  MEDICAL RECORD NO.:  [id number]  LOCATION:  2113                         FACILITY:  [facility]  PHYSICIAN:  Oley Balm. Sung Amabile, MD   DATE OF BIRTH:  Mar 19, 1927  DATE OF ADMISSION:  09/20/2010 DATE OF DISCHARGE:  09/24/2010                              DISCHARGE SUMMARY   DISCHARGE DIAGNOSES: 1. Acute exacerbation of chronic obstructive pulmonary disease. 2. Left lower lobe atelectasis collapse. 3. Hypertension. 4. Pneumonia.  HISTORY OF PRESENT ILLNESS:  [name] [name] is an 77 year old female, patient of Dr. Vassie Loll with a known history of moderate COPD who presented to the [facility] Emergency Room on August 19 with 2 days of increased cough, congestion, and sinus pressure and severe respiratory distress and hypoxia.  She was found to be in hypoxic respiratory failure with recurrent left lower lobe collapse and CCM was asked to admit the patient.  The patient had a recent admission with similar presentation in June 2012 and underwent fiberoptic bronchoscopy that showed left lower lobe endobronchial narrowing without discrete mass.  Biopsy at that time was negative for malignancy and followup chest x-ray did show resolved atelectasis, however, the patient returns this admission with similar presentation.  Laboratory data at the time of admission, August 19, CBC, white blood cell is 11.3, hemoglobin 12.4, hematocrit 37.1, and platelets 159,000. Complete metabolic panel:  Sodium 137, potassium 3.8, glucose 129, BUN 19, creatinine 0.8, troponin less than 0.30.  PT 13.8, INR 1.04. Arterial blood gas 7.40, pCO2 36, pO2 49, bicarb 23.1.  Lactic acid 1.3. BNP 1961.  Urinalysis essentially negative.  Most recent laboratory data August 22, basic metabolic panel, sodium 139, potassium 3.9, glucose 91, BUN 25, creatinine 0.80.  CBC: White blood cells 8.1, hemoglobin 12.4, hematocrit 36.8, and platelets 163,000.  MICRO DATA:  Respiratory  culture from August 22 preliminary report is no growth.  Blood cultures x2 from August 19, preliminary report, no growth.  Urine culture from August 20, final report is negative.  RADIOLOGY DATA:  At the time of admission August 19, chest x-ray shows left lower lobe collapse and pulmonary opacities throughout the left upper lobe.  CT maxillofacial on August 20 shows clear sinuses and no inflammatory disease.  CT of the chest on August 20 shows occlusion of the left lower lobe bronchus due to mucus plugging with associated complete left lower lobe atelectasis.  Mild linear atelectasis in the left upper lobe and small left pleural effusion.  COPD/emphysema.  No acute abnormalities involving the right lung.  No right pleural effusion.  Stable cardiomegaly with extensive 3-vessel coronary calcification.  Most recent radiology data, date of discharge August 23, portable chest x-ray shows no significant change in the left lower lobe infiltrate and small effusion, small right pleural effusion.  HOSPITAL COURSE BY DISCHARGE DIAGNOSES: 1. Acute exacerbation of chronic obstructive pulmonary disease.  The     patient does have history of moderate chronic obstructive pulmonary     disease with an FEV1 of 61%.  She has been an active smoker,     however, claims she quit approximately 2 weeks prior to admission.     The patient was treated initially with IV antibiotics as well  as IV     steroids and inhaled bronchodilators.  She has been weaned off of     steroids and will be discharged on her maintenance home     medications.  Her respiratory distress and hypoxia was likely more     related to atelectasis collapse and questionable pneumonia rather     than acute exacerbation of chronic obstructive pulmonary disease. 2. Left lower lobe atelectasis collapse.  This is recurrent.  Again     the patient had similar admission in June 2012.  At that time     biopsy was negative for malignancy.  The patient  did have repeat     bronchoscopy on August 22 which showed diffuse cobblestoning     appearance.  Left lower lobe bronchus was tight and scope could not     be advanced into the left lower lobe.  There was diffuse mucoidal     secretions and very thick mucoid secretions were removed.     Endobronchial biopsy was obtained from the secondary carina between     left upper lobe and left lower lobe airways.  Cultures in past     remained pending at that time.  The patient was treated with very     aggressive pulmonary hygiene including VibraVest and Mucomyst and     at the time of discharge chest x-ray is much improved.  The     patient's respiratory status is back to baseline.  She will follow     up in the office with repeat chest x-ray 3. Hypertension, chronic ongoing issue for the patient.  She was     borderline hypotensive during this admission and has been restarted     on her home antihypertensives but half the dose.  She will be     discharged on only 5 mg of Norvasc and only 30 mg of Imdur.  She     should follow up with her primary care physician and her     cardiologist for monitoring of blood pressure and need to increase     her home medications. 4. Pneumonia postobstructive in the setting of atelectasis collapse.     The patient was treated initially with IV vancomycin and Zosyn.     Cultures are negative to date.  White blood cell count has returned     to normal and the patient will be discharged on p.o. Avelox for     total of 10-day course of antibiotics.  DISCHARGE MEDICATIONS: 1. Norvasc 5 mg p.o. daily. 2. Imdur 30 mg p.o. daily. 3. Avelox 400 mg p.o. daily x6 days. 4. Aspirin 81 mg p.o. daily. 5. Fish oil 2 capsules p.o. daily. 6. Nitroglycerin sublingual 1 tablet sublingually q.5 minutes x3 as     needed for chest pain. 7. Ultram 50 mg p.o. b.i.d. p.r.n. for pain. 8. Vitamin D3 1000 units 1 tablet p.o. daily.  DISCHARGE ACTIVITY:  The patient is to increase  activity slowly.  DISCHARGE DIET:  Low-sodium heart-healthy diet.  DISCHARGE FOLLOWUP:  The patient is to follow up with Dr. Vassie Loll in the Pulmonary Office on September 14 at 2:30 p.m. with a chest x-ray at that time.  DISPOSITION:  The patient has met maximum benefit from her inpatient hospitalization.  She is medically cleared and ready for discharge.  Her left lower lobe atelectasis collapse and subsequent postobstructive pneumonia are improved.  She will be discharged on 6 further days of p.o. antibiotics and will follow up with  Dr. Vassie Loll in the pulmonary office.  30 minutes was spent on this dictation.     Dirk Dress, NP   ______________________________ Oley Balm Sung Amabile, MD    KW/MEDQ  D:  09/24/2010  T:  09/24/2010  Job:  [id number]  cc:   Neta Mends. Fabian Sharp, MD Madolyn Frieze Jens Som, MD, [facility]  Electronically Signed by Danford Bad N.P. on 09/26/2010 01:33:23 PM Electronically Signed by Billy Fischer MD on 10/05/2010 04:44:17 AM

## 2010-10-07 ENCOUNTER — Telehealth: Payer: Self-pay | Admitting: Pulmonary Disease

## 2010-10-07 MED ORDER — CLOTRIMAZOLE 10 MG MT TROC: OROMUCOSAL | Status: DC

## 2010-10-07 MED ORDER — AMOXICILLIN-POT CLAVULANATE 875-125 MG PO TABS: 1.0000 | ORAL_TABLET | Freq: Two times a day (BID) | ORAL | Status: AC

## 2010-10-07 NOTE — Telephone Encounter (Signed)
Mycelex troches #35 1 troche 5 times daily for 7 days  , no refills  Can give Augmentin 875mg  #14  1 po Twice daily  Begin today , no refills  If not improving will need ov Thurs/Fri for evaluation  Please contact office for sooner follow up if symptoms do not improve or worsen or seek emergency care  follow up Dr. Vassie Loll  Next week as planned  If not seen this week.

## 2010-10-07 NOTE — Telephone Encounter (Signed)
Spoke with the pt and she states she finished augmentin course yesterday and does not feel any improvement in symptoms. She is c/o chest congestion, "rattling in her chest", ear ache, sore throat, and productive cough with yellow phlegm. She states it is difficult to cough the phlegm up because it is thick. She states she is using mucinex, flutter valve and inhalers as directed. She also c/o her mouth and tongue being very sore with white patches. She states she was not rinsing after using her inhalers.  She denies fever or SOB. Please advise. Carron Curie, CMA Allergies  Allergen Reactions  . Albuterol     Shakes   . Nicoderm (Nicotine)     GI Upset  . Oxycodone   . Prednisone     Shakes  . Tequin   . Verapamil     REACTION: CONSTIPATION

## 2010-10-07 NOTE — Telephone Encounter (Signed)
I spoke with pt and she verbalized understanding of TP recs. Pt aware rx's has been sent and aware of the directions. Nothing further was needed

## 2010-10-09 ENCOUNTER — Telehealth: Payer: Self-pay | Admitting: Pulmonary Disease

## 2010-10-09 MED ORDER — PREDNISONE 10 MG PO TABS: ORAL_TABLET | ORAL | Status: DC

## 2010-10-09 NOTE — Telephone Encounter (Signed)
NO malignancy Culture neg Have her complete antibiotic, start prednisone 40 mg x 2ds, 30 mg x 2 ds, 20 mg x 2ds, 10 mg x 2ds Arrange FU for early next week

## 2010-10-09 NOTE — Telephone Encounter (Signed)
Pt c/o increased wheezing, sob most of the time and cough with yellow sputum. She is currently taking the Augmentin but doesn't feel it is helping. She is also wanting the results from her last bronchoscopy that was done while in the hospital. Pls advise.

## 2010-10-09 NOTE — Telephone Encounter (Signed)
Pt is coming in Monday 9/10 at 1:45. Pt aware of ra recs and rx for rped sent pharmacy

## 2010-10-12 ENCOUNTER — Ambulatory Visit (INDEPENDENT_AMBULATORY_CARE_PROVIDER_SITE_OTHER): Payer: Medicare Other | Admitting: Cardiology

## 2010-10-12 ENCOUNTER — Encounter (INDEPENDENT_AMBULATORY_CARE_PROVIDER_SITE_OTHER): Payer: Medicare Other | Admitting: Cardiology

## 2010-10-12 ENCOUNTER — Encounter: Payer: Self-pay | Admitting: Cardiology

## 2010-10-12 ENCOUNTER — Ambulatory Visit (INDEPENDENT_AMBULATORY_CARE_PROVIDER_SITE_OTHER): Payer: Medicare Other | Admitting: Pulmonary Disease

## 2010-10-12 ENCOUNTER — Encounter: Payer: Self-pay | Admitting: Pulmonary Disease

## 2010-10-12 ENCOUNTER — Ambulatory Visit (INDEPENDENT_AMBULATORY_CARE_PROVIDER_SITE_OTHER)
Admission: RE | Admit: 2010-10-12 | Discharge: 2010-10-12 | Disposition: A | Discharge status: Home / Self Care | Payer: Medicare Other | Source: Ambulatory Visit | Attending: Pulmonary Disease | Admitting: Pulmonary Disease

## 2010-10-12 VITALS — BP 132/64 | HR 64 | Temp 98.0°F | Ht 64.0 in | Wt 104.4 lb

## 2010-10-12 DIAGNOSIS — I2510 Atherosclerotic heart disease of native coronary artery without angina pectoris: Secondary | ICD-10-CM

## 2010-10-12 DIAGNOSIS — I70219 Atherosclerosis of native arteries of extremities with intermittent claudication, unspecified extremity: Secondary | ICD-10-CM

## 2010-10-12 DIAGNOSIS — I10 Essential (primary) hypertension: Secondary | ICD-10-CM

## 2010-10-12 DIAGNOSIS — I739 Peripheral vascular disease, unspecified: Secondary | ICD-10-CM

## 2010-10-12 DIAGNOSIS — J9811 Atelectasis: Secondary | ICD-10-CM

## 2010-10-12 DIAGNOSIS — J449 Chronic obstructive pulmonary disease, unspecified: Secondary | ICD-10-CM

## 2010-10-12 DIAGNOSIS — I701 Atherosclerosis of renal artery: Secondary | ICD-10-CM

## 2010-10-12 DIAGNOSIS — E785 Hyperlipidemia, unspecified: Secondary | ICD-10-CM

## 2010-10-12 DIAGNOSIS — J9819 Other pulmonary collapse: Secondary | ICD-10-CM

## 2010-10-12 DIAGNOSIS — Z23 Encounter for immunization: Secondary | ICD-10-CM

## 2010-10-12 MED ORDER — AMLODIPINE BESYLATE 5 MG PO TABS: 5.0000 mg | ORAL_TABLET | Freq: Every day | ORAL | Status: DC

## 2010-10-12 MED ORDER — ISOSORBIDE MONONITRATE ER 30 MG PO TB24: 30.0000 mg | ORAL_TABLET | Freq: Every day | ORAL | Status: DC

## 2010-10-12 NOTE — Progress Notes (Signed)
HPI: [name] [name] is a pleasant female who has a history of coronary artery disease, status post stenting of the right coronary artery in December 2005. She also has a history of cerebrovascular disease.  Her most recent carotid Dopplers were performed in Dec 2011.  There was a 40-59% bilateral stenosis.  Followup was recommended in 2 years. She also has a history of renal artery stenosis.  Last renal Dopplers were in August 2010.  Renal arteries showed no stenosis. There was a right renal mass.  However dedicated ultrasound showed a renal cyst. She also has peripheral vascular disease and has seen Dr. Excell Seltzer. An arteriogram revealed occlusion of the left SFA. It was unfavorable for intervention and medical therapy was recommended if possible. ABIs in August of 2011 was 0.6 on the left and 1.0 on the right. Patient had cardiac catheterization in November of 2011 secondary to chest pain which revealed left main normal.  The LAD had proximal luminal irregularities. First diagonal was large with ostial 25% stenosis.  Circumflex in the AV groove had mid long 25% stenosis.  Obtuse marginal was large and normal.  The right coronary artery with long in-stent 30% stenosis.  PDA was moderate size and normal. Posterolateral was small and normal.  EF 65. Echo in June of 2012 showed normal LV function, mild AI and MR. Since I last saw her there is no increased dyspnea on exertion, orthopnea, PND, pedal edema, syncope or chest pain. She does have pain in her left lower extremity with ambulation.  Current Outpatient Prescriptions  Medication Sig Dispense Refill  . amLODipine (NORVASC) 5 MG tablet Take 5 mg by mouth daily.        Marland Kitchen amoxicillin-clavulanate (AUGMENTIN) 875-125 MG per tablet Take 1 tablet by mouth 2 (two) times daily.  14 tablet  0  . aspirin 81 MG EC tablet Take 81 mg by mouth daily.        . Cholecalciferol (VITAMIN D) 1000 UNITS capsule Take 1,000 Units by mouth daily.        . fish oil-omega-3 fatty acids  1000 MG capsule Take 2 g by mouth daily.        . isosorbide mononitrate (IMDUR) 30 MG 24 hr tablet Take 30 mg by mouth daily.        . Mometasone Furo-Formoterol Fum 200-5 MCG/ACT AERO Inhale 2 puffs into the lungs 2 (two) times daily.  1 Inhaler  11  . nitroGLYCERIN (NITROSTAT) 0.4 MG SL tablet Place 0.4 mg under the tongue every 5 (five) minutes as needed.        . predniSONE (DELTASONE) 10 MG tablet Take 40 mg x 2 days, 30 mg x 2 days, 20 mg x 2 days, 10 mg x 2 days  20 tablet  0  . tiotropium (SPIRIVA HANDIHALER) 18 MCG inhalation capsule Place 1 capsule (18 mcg total) into inhaler and inhale daily.  30 capsule  11  . traMADol (ULTRAM) 50 MG tablet TAKE TWO TABLETS BY MOUTH EVERY SIX HOURS AS NEEDED  30 tablet  0  . VOLTAREN 1 % GEL APPLY 2 GRAMS TO UPPER EXTREMITIES UP TO 4 TIMES DAILY FOR ARTHRITISPAIN.MAX 8 GM PER DAY PER JOINT.  100 g  1  . clotrimazole (MYCELEX) 10 MG troche 1 troche 5 times a day x 7 days  35 tablet  0     Past Medical History  Diagnosis Date  . POSTHERPETIC NEURALGIA 08/25/2006  . CARCINOMA, BASAL CELL 02/13/2006  . NEOPLASM, SKIN, UNCERTAIN  BEHAVIOR 12/16/2008  . HYPOTHYROIDISM 01/30/2007  . Unspecified vitamin D deficiency 02/07/2007  . HYPERLIPIDEMIA 03/10/2008  . UNSPECIFIED ANEMIA 03/18/2008  . THROMBOCYTOPENIA 07/30/2008  . LOSS, HEARING NOS 08/25/2006  . HYPERTENSION 02/13/2006  . UNSTABLE ANGINA 12/12/2009  . CORONARY ARTERY DISEASE 02/13/2006  . CAROTID ARTERY DISEASE 10/03/2006  . RENAL ARTERY STENOSIS 08/01/2008  . PERIPHERAL VASCULAR DISEASE 02/13/2006  . Chronic rhinitis 12/13/2006  . CHRONIC OBSTRUCTIVE PULMONARY DISEASE, ACUTE EXACERBATION 11/17/2009  . DERMATITIS 09/25/2007  . DEGENERATIVE JOINT DISEASE 09/27/2006  . OSTEOARTHRITIS, HAND 12/16/2008  . SPINAL STENOSIS 11/01/2006  . LIVER FUNCTION TESTS, ABNORMAL 01/22/2009  . UNS ADVRS EFF UNS RX MEDICINAL&BIOLOGICAL SBSTNC 01/30/2007  . Personal history of malignant neoplasm of breast 07/24/2008  .  NEPHROLITHIASIS, HX OF 02/27/2010  . Vitreous detachment   . Shingles     recurrent  . Head trauma     closed  . Personal history of urinary calculi     Past Surgical History  Procedure Date  . Lumbar laminectomy   . Cataract extraction   . Abdominal hysterectomy   . Breast lumpectomy   . Tonsillectomy   . Coronary angioplasty with stent placement     bilat iliac stents, left renal artery and rt coronary artery last myoview 8/09 with EF 70%  . Skin cancer excision     History   Social History  . Marital Status: Widowed    Spouse Name: N/A    Number of Children: N/A  . Years of Education: N/A   Occupational History  . Not on file.   Social History Main Topics  . Smoking status: Former Smoker -- 1.0 packs/day for 30 years    Types: Cigarettes    Quit date: 06/15/2010  . Smokeless tobacco: Never Used  . Alcohol Use: Yes     socially  . Drug Use: No  . Sexually Active: Not on file   Other Topics Concern  . Not on file   Social History Narrative   Widowed5 hours of sleepHas friends checking on herNo Pets    ROS: no fevers or chills, productive cough, hemoptysis, dysphasia, odynophagia, melena, hematochezia, dysuria, hematuria, rash, seizure activity, orthopnea, PND, pedal edema, claudication. Remaining systems are negative.  Physical Exam: Well-developed frail in no acute distress.  Skin is warm and dry. Diffuse ecchymosis HEENT is normal.  Neck is supple. No thyromegaly.  Chest is clear to auscultation with normal expansion.  Cardiovascular exam is regular rate and rhythm.  Abdominal exam nontender or distended. No masses palpated. Extremities show no edema. neuro grossly intact

## 2010-10-12 NOTE — Assessment & Plan Note (Signed)
Symptoms unchanged and followed by Dr. Excell Seltzer.

## 2010-10-12 NOTE — Assessment & Plan Note (Signed)
Continue diet. Not on statin with history of increased liver functions.

## 2010-10-12 NOTE — Assessment & Plan Note (Signed)
Blood pressure controlled. Continue present medications. 

## 2010-10-12 NOTE — Assessment & Plan Note (Signed)
Continue present medications. Not on a statin with history of increased liver functions.

## 2010-10-12 NOTE — Assessment & Plan Note (Signed)
Stay on spiriva & symbicort for now 'flu shot

## 2010-10-12 NOTE — Assessment & Plan Note (Signed)
Continue aspirin 

## 2010-10-12 NOTE — Assessment & Plan Note (Signed)
It  seems that there is no malignancy after all  - note complete ree xpansion of the LLL. The recurrent atelectasis & pneumonia is likely due to endobronchial narrowing from calcification or amyloid. Thi raises the question of whether she has systemic amyloid or merely, focal ?

## 2010-10-12 NOTE — Progress Notes (Signed)
  Subjective:    Patient ID: [name] [name], female    DOB: 07/06/1927, 77 y.o.   MRN: [id number]  HPI PCP - [name]   82/F,ex - smoker, with CAD, PVD adm 5/31-07/05/10 for hypoxia & LLL atelectasis.  Ct chest showed Volume loss in the inferior left hemithorax. Concurrent filling of the left lower and less so left upper lobe endobronchial  tree.  Bronchoscopy showed LLL endobronchial narrowing, without discrete mass. Papule on rt mainstem bronchus was brushed - cytology all neg. BAL cx, afb, fungal neg  07/16/10 >>Spirometry >>FEV1 61%  CXR - resolved atelecatsis, clearing of infiltrate  She quit smoking in 5/12     10/12/2010 Re- admitted 8/19-8/23/12  for pneumonia & LLL atx. Cx neg. She was tx with aggressive pulm hygiene w/ nebs, o2, IV abx and steroids. Pt underwent FOB this admit which noted thick mucoid secretions with difficulty passing scope. Cytology was neg for malignancy . Path showed AMYLOID DEPOSITION AND DYSTROPHIC CALCIFICATIONS.Marland Kitchen She was tx aggressively with ABX for possible post obstructive PNA , discharged on Avelox for total of 10 days.  >> augmentin + dulera Completed course & still c/o whezing - prednisone taper CT sinuses 8/12 neg Rpt Ct chest -improved aeration LLL, calcified bronchi, no endobronchial lesions    Review of Systems Pt denies any significant  nasal congestion or excess secretions, fever, chills, sweats, unintended wt loss, pleuritic or exertional cp, orthopnea pnd or leg swelling.  Pt also denies any obvious fluctuation in symptoms with weather or environmental change or other alleviating or aggravating factors.    Pt denies any increase in rescue therapy over baseline, denies waking up needing it or having early am exacerbations or coughing/wheezing/ or dyspnea      Objective:   Physical Exam Gen. Pleasant, well-nourished, in no distress ENT - no lesions, no post nasal drip Neck: No JVD, no thyromegaly, no carotid bruits Lungs: no use of  accessory muscles, no dullness to percussion, clear without rales or rhonchi  Cardiovascular: Rhythm regular, heart sounds  normal, no murmurs or gallops, no peripheral edema Musculoskeletal: No deformities, no cyanosis or clubbing         Assessment & Plan:

## 2010-10-12 NOTE — Patient Instructions (Signed)
You have calcification in the left lower bronchus that causes repeated pneumonia & collapse Continue using flutter valve Start taking mucinex if breathing gets worse or pain in left lung starts again Stay on dulera & spiriva CT scan looks good - lung has opened up. Flu shot Tetanus shot

## 2010-10-12 NOTE — Assessment & Plan Note (Signed)
followup carotid Dopplers December 2013.

## 2010-10-12 NOTE — Patient Instructions (Signed)
Your physician wants you to follow-up in: 9 months You will receive a reminder letter in the mail two months in advance. If you don't receive a letter, please call our office to schedule the follow-up appointment.  

## 2010-10-16 ENCOUNTER — Inpatient Hospital Stay: Payer: Medicare Other | Admitting: Pulmonary Disease

## 2010-11-02 ENCOUNTER — Other Ambulatory Visit: Payer: Self-pay | Admitting: Cardiology

## 2010-11-02 DIAGNOSIS — I2510 Atherosclerotic heart disease of native coronary artery without angina pectoris: Secondary | ICD-10-CM

## 2010-11-02 MED ORDER — ISOSORBIDE MONONITRATE ER 30 MG PO TB24: 30.0000 mg | ORAL_TABLET | Freq: Every day | ORAL | Status: DC

## 2010-11-02 MED ORDER — AMLODIPINE BESYLATE 5 MG PO TABS: 5.0000 mg | ORAL_TABLET | Freq: Every day | ORAL | Status: DC

## 2010-11-02 NOTE — Telephone Encounter (Signed)
Pt wants 90 day supply of both prescriptions, not 30 days

## 2010-11-03 ENCOUNTER — Telehealth: Payer: Self-pay | Admitting: Pulmonary Disease

## 2010-11-03 NOTE — Telephone Encounter (Signed)
Called, spoke with pt.  States she is not getting any better since seeing Dr. Vassie Loll on 10/12/10.  States she is still having congestion and coughing but it is difficult to get the mucus up.  When she does it is dark yellow.  She is taking mucinex which does not seem to be helping, using spiriva qd, and still using dulera 2 puffs bid but states this makes her nauseated.  I offered OV for tomorrow but this does not work for pt as she has another OV.  Pt requesting to have this scheduled for Thursday.  I scheduled pt for an OV with TP on 11/05/10 at 9:15am with TP but advised pt if sxs worsen prior to this to seek emergency care - Urgent Care or ED.  Pt verbalized understanding and will call back if anything further is needed.

## 2010-11-05 ENCOUNTER — Ambulatory Visit (INDEPENDENT_AMBULATORY_CARE_PROVIDER_SITE_OTHER): Payer: Medicare Other | Admitting: Adult Health

## 2010-11-05 ENCOUNTER — Encounter: Payer: Self-pay | Admitting: Adult Health

## 2010-11-05 ENCOUNTER — Telehealth: Payer: Self-pay | Admitting: Adult Health

## 2010-11-05 VITALS — BP 118/60 | HR 90 | Temp 97.7°F | Ht 64.0 in | Wt 101.4 lb

## 2010-11-05 DIAGNOSIS — J449 Chronic obstructive pulmonary disease, unspecified: Secondary | ICD-10-CM

## 2010-11-05 MED ORDER — LEVALBUTEROL HCL 0.63 MG/3ML IN NEBU
0.6300 mg | INHALATION_SOLUTION | Freq: Once | RESPIRATORY_TRACT | Status: AC
  Administered 2010-11-05: 0.63 mg via RESPIRATORY_TRACT

## 2010-11-05 MED ORDER — AMOXICILLIN-POT CLAVULANATE 875-125 MG PO TABS: 1.0000 | ORAL_TABLET | Freq: Two times a day (BID) | ORAL | Status: AC

## 2010-11-05 MED ORDER — LEVALBUTEROL HCL 0.63 MG/3ML IN NEBU: 1.0000 | INHALATION_SOLUTION | Freq: Once | RESPIRATORY_TRACT | Status: DC

## 2010-11-05 NOTE — Patient Instructions (Addendum)
BEgin Augmentin 875mg  Twice daily  For 10  days -take with food, eat yogurt daily  Fluids and rest.  Mucinex DM Twice daily  As needed  Cough/congestion  Flutter valve (green tube) Three times a day  As needed  For congestion  Saline nasal rinses As needed   Please contact office for sooner follow up if symptoms do not improve or worsen or seek emergency care  follow up Dr. Vassie Loll  In 2 weeks and As needed

## 2010-11-05 NOTE — Assessment & Plan Note (Addendum)
Recurrent exacerbation - pt declined xray today    Plan;  BEgin Augmentin 875mg  Twice daily  For 10  days -take with food, eat yogurt daily  Fluids and rest.  Mucinex DM Twice daily  As needed  Cough/congestion  Flutter valve (green tube) Three times a day  As needed  For congestion  Saline nasal rinses As needed   Please contact office for sooner follow up if symptoms do not improve or worsen or seek emergency care  follow up Dr. Vassie Loll  In 2 weeks and As needed

## 2010-11-05 NOTE — Progress Notes (Signed)
  Subjective:    Patient ID: [name] [name], female    DOB: 05-Mar-1927, 77 y.o.   MRN: [id number]  HPI PCP - [name]   82/F,ex - smoker, with CAD, PVD adm 5/31-07/05/10 for hypoxia & LLL atelectasis.  Ct chest showed Volume loss in the inferior left hemithorax. Concurrent filling of the left lower and less so left upper lobe endobronchial  tree.  Bronchoscopy showed LLL endobronchial narrowing, without discrete mass. Papule on rt mainstem bronchus was brushed - cytology all neg. BAL cx, afb, fungal neg  07/16/10 >>Spirometry >>FEV1 61%  CXR - resolved atelecatsis, clearing of infiltrate  She quit smoking in 5/12  10/12/2010 Re- admitted 8/19-8/23/12  for pneumonia & LLL atx. Cx neg. She was tx with aggressive pulm hygiene w/ nebs, o2, IV abx and steroids. Pt underwent FOB this admit which noted thick mucoid secretions with difficulty passing scope. Cytology was neg for malignancy . Path showed AMYLOID DEPOSITION AND DYSTROPHIC CALCIFICATIONS.Marland Kitchen She was tx aggressively with ABX for possible post obstructive PNA , discharged on Avelox for total of 10 days.  >> augmentin + dulera Completed course & still c/o whezing - prednisone taper CT sinuses 8/12 neg Rpt Ct chest -improved aeration LLL, calcified bronchi, no endobronchial lesions  11/05/2010 Acute OV  Pt returns for persistent symptoms. Complains of cough worsened since last ov 3 weeks ago. Complains of productive cough with thick yellow, brown mucus. Complains of SOB with cough, body aches, chest soreness from cough.  Cough has been slowly getting worse , more dyspnea for last 3 days. Over last 3 days coughing up thick yellow mucus most of the day. Mucinex and flutter are not helping.     Review of Systems   Constitutional:   No  weight loss, night sweats,  Fevers, + chills, fatigue, or  lassitude.  HEENT:   No headaches,  Difficulty swallowing,  Tooth/dental problems, or  Sore throat,                No sneezing, itching, ear ache,  nasal congestion, post nasal drip,   CV:  No chest pain,  Orthopnea, PND, swelling in lower extremities, anasarca, dizziness, palpitations, syncope.   GI  No heartburn, indigestion, abdominal pain, nausea, vomiting, diarrhea, change in bowel habits, loss of appetite, bloody stools.   Resp:   No coughing up of blood.   Marland Kitchen  No chest wall deformity  Skin: no rash or lesions.  GU: no dysuria, change in color of urine, no urgency or frequency.  No flank pain, no hematuria   MS:  No joint pain or swelling.  No decreased range of motion.     Psych:  No change in mood or affect. No depression or anxiety.  No memory loss.      Objective:   Physical Exam Gen. Pleasant, thin elderly female  ENT - no lesions, no post nasal drip Neck: No JVD, no thyromegaly, no carotid bruits Lungs: no use of accessory muscles, no dullness to percussion, coarse BS w/ scattered rhonchi  Cardiovascular: Rhythm regular, heart sounds  normal, no murmurs or gallops, no peripheral edema Musculoskeletal: No deformities, no cyanosis or clubbing         Assessment & Plan:

## 2010-11-05 NOTE — Progress Notes (Signed)
Addended by: Abigail Miyamoto D on: 11/05/2010 10:33 AM   Modules accepted: Orders

## 2010-11-05 NOTE — Telephone Encounter (Signed)
Spoke with pt. She states that her pharmacist has called her and advised that we sent in rx for xopenex and it is not covered. I advised that this was a mistake, that we gave her one time  tx here in the office and this was not meant to be sent. Pt verbalized understanding and rx was cancelled at pharm.

## 2010-11-05 NOTE — Progress Notes (Signed)
  Subjective:    Patient ID: [name] [name], female    DOB: [birth year], 77 y.o.   MRN: [id number]  HPI    Review of Systems Pt uses Flutter Valve    Objective:   Physical Exam        Assessment & Plan:

## 2010-11-10 ENCOUNTER — Telehealth: Payer: Self-pay | Admitting: Pulmonary Disease

## 2010-11-10 MED ORDER — PREDNISONE 10 MG PO TABS: ORAL_TABLET | ORAL | Status: DC

## 2010-11-10 NOTE — Telephone Encounter (Signed)
Spoke with pt. She states her cough is not getting better since last seen. She states that she is no worse and sputum is clear now, but has constant cough despite taking meds as prescribed. I advised needs ov and offered ov but she refused, stating that she was just seen and did not wish to return so soon. Tam, pls advise, thanks! Allergies  Allergen Reactions  . Albuterol     Shakes   . Nicoderm (Nicotine)     GI Upset  . Oxycodone   . Prednisone     Shakes  . Tequin   . Verapamil     REACTION: CONSTIPATION

## 2010-11-10 NOTE — Telephone Encounter (Signed)
Spoke with pt and notified of recs per TP. She verbalized understanding. Rx was sent to pharm.

## 2010-11-10 NOTE — Telephone Encounter (Signed)
Prednisone taper over next week Prednisone 10mg  4 tabs for 2 days, then 3 tabs for 2 days, 2 tabs for 2 days, then 1 tab for 2 days, then stop #20 , no refills Cont w/ ov recs Please contact office for sooner follow up if symptoms do not improve or worsen or seek emergency care  If she is not improving , I want her to come into be seen -she decompensates fast.

## 2010-11-17 ENCOUNTER — Telehealth: Payer: Self-pay | Admitting: Pulmonary Disease

## 2010-11-17 NOTE — Telephone Encounter (Signed)
Pt c/o productive cough with yellow mucus. Finished abx and prednisone this AM. Wants to know how long to take Muncinex. I advised the pt to take Municex as needed for cough and congestion. If her mucus becomes green or coughing gets worse to call us. Pt verbalized understanding

## 2010-11-26 ENCOUNTER — Ambulatory Visit (INDEPENDENT_AMBULATORY_CARE_PROVIDER_SITE_OTHER): Payer: Medicare Other | Admitting: Internal Medicine

## 2010-11-26 ENCOUNTER — Encounter: Payer: Self-pay | Admitting: Internal Medicine

## 2010-11-26 VITALS — BP 120/60 | HR 80 | Wt 105.0 lb

## 2010-11-26 DIAGNOSIS — R0600 Dyspnea, unspecified: Secondary | ICD-10-CM

## 2010-11-26 DIAGNOSIS — R0989 Other specified symptoms and signs involving the circulatory and respiratory systems: Secondary | ICD-10-CM

## 2010-11-26 DIAGNOSIS — J9819 Other pulmonary collapse: Secondary | ICD-10-CM

## 2010-11-26 DIAGNOSIS — R0609 Other forms of dyspnea: Secondary | ICD-10-CM

## 2010-11-26 DIAGNOSIS — K140 Glossitis: Secondary | ICD-10-CM | POA: Insufficient documentation

## 2010-11-26 DIAGNOSIS — R0602 Shortness of breath: Secondary | ICD-10-CM | POA: Insufficient documentation

## 2010-11-26 DIAGNOSIS — I10 Essential (primary) hypertension: Secondary | ICD-10-CM

## 2010-11-26 DIAGNOSIS — J9811 Atelectasis: Secondary | ICD-10-CM

## 2010-11-26 DIAGNOSIS — J40 Bronchitis, not specified as acute or chronic: Secondary | ICD-10-CM

## 2010-11-26 DIAGNOSIS — J449 Chronic obstructive pulmonary disease, unspecified: Secondary | ICD-10-CM

## 2010-11-26 MED ORDER — METHYLPREDNISOLONE ACETATE 80 MG/ML IJ SUSP
120.0000 mg | Freq: Once | INTRAMUSCULAR | Status: AC
  Administered 2010-11-26: 120 mg via INTRAMUSCULAR

## 2010-11-26 MED ORDER — NYSTATIN 100000 UNIT/ML MT SUSP: OROMUCOSAL | Status: DC

## 2010-11-26 NOTE — Progress Notes (Signed)
  Subjective:    Patient ID: [name] [name], female    DOB: 05-Dec-1927, 77 y.o.   MRN: [id number]  HPI PAtient comes in for  routine follow up but much has happened since last visit .   Has been hospitalized for pulmonary compromise and infection and had a bx followed by pulmonary and has been off nad on antibiotics and prednisone. Most recently has been on augmentin and 5 days of prednisone but  Finished at least a week ago.  Has sob and coughing worse in am, every day  Supposed to follow up in a couple of weeks.   CV says stable. No tobacco  By hx .  No recent falling.   Review of Systems No fever vomiting bleeding .  Skin has abrasion hard to heal Lives by self [facility] nurse comes ou once a  Month.  Past history family history social history reviewed in the electronic medical record.     Objective:   Physical Exam Alert in mod resp distress sob with coughing   . After coughing speech better .  Color good  No clubbing cyanosis or edema Pulse ox 97%   Oriented x 3 and no noted deficits in memory, attention, and speech. OP shiny pink red tongue no spots seen.  Neck no masses Chest: inc resp effort  Breathless  Diffuse rhionchi and  Wheeze   Coughing spells  And then better  CV rr  Gait  Non focal    review of record   Difficult to ascertain what all has transpired   In the current EHR.  And hospitalization  15 minute record review  In addition to visit today      Assessment & Plan:  Acute  resp sx COPD with apparently some amyloid deposition and  Bronchial narrowing  This has been problematic and she  resports sig distress at times Thinks she has thrush at least glossitis  Will use nystatin washes empiric.  Solumedrol 120 now im  Get her back to pulmonary and see Dr Vassie Loll to optimize control   Sample of dulera given.   CV ht PVD CAD apparently stable

## 2010-11-26 NOTE — Patient Instructions (Signed)
Will try to arrange   For dr Vassie Loll to see you  Soon .  In the meantime   hopefully the steroid will help. Avoid respiratory.irritants. Then plan follow up.

## 2010-11-30 ENCOUNTER — Ambulatory Visit: Payer: Medicare Other | Admitting: Adult Health

## 2010-11-30 ENCOUNTER — Inpatient Hospital Stay (INDEPENDENT_AMBULATORY_CARE_PROVIDER_SITE_OTHER)
Admission: RE | Admit: 2010-11-30 | Discharge: 2010-11-30 | Disposition: A | Discharge status: Home / Self Care | Payer: Medicare Other | Source: Ambulatory Visit | Attending: Family Medicine | Admitting: Family Medicine

## 2010-11-30 DIAGNOSIS — S81009A Unspecified open wound, unspecified knee, initial encounter: Secondary | ICD-10-CM

## 2010-11-30 DIAGNOSIS — S91009A Unspecified open wound, unspecified ankle, initial encounter: Secondary | ICD-10-CM

## 2010-12-01 ENCOUNTER — Encounter: Payer: Self-pay | Admitting: Internal Medicine

## 2010-12-01 ENCOUNTER — Ambulatory Visit (INDEPENDENT_AMBULATORY_CARE_PROVIDER_SITE_OTHER): Payer: Medicare Other | Admitting: Internal Medicine

## 2010-12-01 VITALS — BP 140/70 | HR 72 | Wt 103.0 lb

## 2010-12-01 DIAGNOSIS — IMO0002 Reserved for concepts with insufficient information to code with codable children: Secondary | ICD-10-CM | POA: Insufficient documentation

## 2010-12-01 DIAGNOSIS — L089 Local infection of the skin and subcutaneous tissue, unspecified: Secondary | ICD-10-CM | POA: Insufficient documentation

## 2010-12-01 DIAGNOSIS — T148XXA Other injury of unspecified body region, initial encounter: Secondary | ICD-10-CM

## 2010-12-01 DIAGNOSIS — X58XXXA Exposure to other specified factors, initial encounter: Secondary | ICD-10-CM

## 2010-12-01 DIAGNOSIS — H539 Unspecified visual disturbance: Secondary | ICD-10-CM

## 2010-12-01 DIAGNOSIS — Z79899 Other long term (current) drug therapy: Secondary | ICD-10-CM

## 2010-12-01 DIAGNOSIS — J449 Chronic obstructive pulmonary disease, unspecified: Secondary | ICD-10-CM

## 2010-12-01 DIAGNOSIS — S8010XA Contusion of unspecified lower leg, initial encounter: Secondary | ICD-10-CM

## 2010-12-01 HISTORY — DX: Local infection of the skin and subcutaneous tissue, unspecified: L08.9

## 2010-12-01 MED ORDER — CEFTRIAXONE SODIUM 1 G IJ SOLR
1.0000 g | Freq: Once | INTRAMUSCULAR | Status: AC
  Administered 2010-12-01: 1 g via INTRAMUSCULAR

## 2010-12-01 MED ORDER — CEPHALEXIN 500 MG PO CAPS: 500.0000 mg | ORAL_CAPSULE | Freq: Three times a day (TID) | ORAL | Status: DC

## 2010-12-01 NOTE — Assessment & Plan Note (Signed)
Lungs ar e much better today clnically and exam but had depomedrol last week  . Still needs pulm physician follow up.

## 2010-12-01 NOTE — Patient Instructions (Addendum)
Am concerned that the wound s getting infected. keep dry dressing   Take both antibiotic ( new one beginning tomorrow.) Call tomorrow about how this is doing. And then plan recheck Friday .  See eye doctor about vision changes

## 2010-12-01 NOTE — Progress Notes (Signed)
  Subjective:    Patient ID: [name] [name], female    DOB: Mar 04, 1927, 77 y.o.   MRN: [id number]  HPI Patient comes in today for SDA  For acute problem evaluation. Seen in ed  After above injury hit leg against  bedSee above hx  Injury  10 27  . Washed and stripped.  Was put on septra an on med for 2 doses.   No active bleeding . No fever . Some increase redness .  COPD breathing a lot better today  Some coufh still in am .  dulera  Causes jitteriness  Has ? About this.   Says vision more blurry ? Could be the dulera? Review of Systems No cp fever bleeding  Rest as per hpi or prev  Night flushes  Past history family history social history reviewed in the electronic medical record. urgentcare record reviewed     Objective:   Physical Exam  WD slender alert wf in nad   Breathing better than last week minimal breathless ness Neck no masses  CHEST :  Few wheezes rhonchi no rales  EXTR right leg with  Large curvilinear laceration  With steristrips noted and some bruising and redness  That has a few streaks up leg no edema or discharge    Gait non antalgic       Assessment & Plan:  Large laceration avulsion   prob early infection    Rocephin today and then add keflex to septra  Call tomorrow or if worse  Recheck 3 days .  COPD  Much improved but after solumedrol and has se of dulera needs to see pulmonary . Asks about physician visit.  Recurrents  sweat at night ? Cause ? If hypoxic at night   As pulmonary about this

## 2010-12-04 ENCOUNTER — Ambulatory Visit (INDEPENDENT_AMBULATORY_CARE_PROVIDER_SITE_OTHER): Payer: Medicare Other | Admitting: Internal Medicine

## 2010-12-04 ENCOUNTER — Encounter: Payer: Self-pay | Admitting: Internal Medicine

## 2010-12-04 VITALS — BP 120/80 | HR 60 | Temp 98.6°F

## 2010-12-04 DIAGNOSIS — L089 Local infection of the skin and subcutaneous tissue, unspecified: Secondary | ICD-10-CM

## 2010-12-04 DIAGNOSIS — IMO0002 Reserved for concepts with insufficient information to code with codable children: Secondary | ICD-10-CM

## 2010-12-04 DIAGNOSIS — T148XXA Other injury of unspecified body region, initial encounter: Secondary | ICD-10-CM

## 2010-12-04 DIAGNOSIS — J449 Chronic obstructive pulmonary disease, unspecified: Secondary | ICD-10-CM

## 2010-12-04 DIAGNOSIS — X58XXXA Exposure to other specified factors, initial encounter: Secondary | ICD-10-CM

## 2010-12-04 MED ORDER — CEFTRIAXONE SODIUM 1 G IJ SOLR
1.0000 g | Freq: Once | INTRAMUSCULAR | Status: AC
  Administered 2010-12-04: 1 g via INTRAMUSCULAR

## 2010-12-04 NOTE — Patient Instructions (Signed)
Continue med Can use  polysporin and keep clean   Recheck next Wednesday or thereabouts

## 2010-12-04 NOTE — Assessment & Plan Note (Signed)
Looks a lot better than a few days ago but has a long way to go to heal.   Rocephin to day and recheck next week.

## 2010-12-04 NOTE — Progress Notes (Signed)
  Subjective:    Patient ID: ANGELLICA MADDISON, female    DOB: 1927-12-28, 77 y.o.   MRN: [id number]  HPI Comes in for follow up of   Wound infection .  Since last visit has had no fever still hurts but not draining.using ? Neosporin no swelling and less redness.  NO side effects of meds.  COPD getting better  Still taking mucinex  Has pul appt next week of so   On antibiotic for wound and no oral steroids currently.    Review of Systems NO fever no falling no change in  Neuro status.  Past history family history social history reviewed in the electronic medical record.     Objective:   Physical Exam  IN  NAD  resp status quiet respirations. RLE  No edema less redness surrounding area mid flap is ballotable and very thin skin  o discharge  No redness inferior ( gone)  Dry dressing       Assessment & Plan:  Wound infection  Sig avulsion laceration  Looks much better but at risk will tale a longtime to heal  Rocephin  Today   and  Then fu next week. Call prn  Over weekend if needed.  Keep pulm appt .

## 2010-12-05 ENCOUNTER — Encounter: Payer: Self-pay | Admitting: Internal Medicine

## 2010-12-11 ENCOUNTER — Ambulatory Visit (INDEPENDENT_AMBULATORY_CARE_PROVIDER_SITE_OTHER): Payer: Medicare Other | Admitting: Internal Medicine

## 2010-12-11 ENCOUNTER — Encounter: Payer: Self-pay | Admitting: Internal Medicine

## 2010-12-11 VITALS — BP 120/60 | HR 78 | Temp 98.3°F | Wt 100.0 lb

## 2010-12-11 DIAGNOSIS — J449 Chronic obstructive pulmonary disease, unspecified: Secondary | ICD-10-CM

## 2010-12-11 DIAGNOSIS — L089 Local infection of the skin and subcutaneous tissue, unspecified: Secondary | ICD-10-CM

## 2010-12-11 DIAGNOSIS — R252 Cramp and spasm: Secondary | ICD-10-CM

## 2010-12-11 MED ORDER — CEPHALEXIN 500 MG PO CAPS: 500.0000 mg | ORAL_CAPSULE | Freq: Three times a day (TID) | ORAL | Status: AC

## 2010-12-11 NOTE — Patient Instructions (Addendum)
Gently and cleanly soak off the strips. Antibiotic ointmen and keep covered.  Continue on the keflex another 7 days  rov in 2 weeks or so to check or  As needed.  Talk with pulmonary about the side effects of meds.

## 2010-12-12 ENCOUNTER — Encounter: Payer: Self-pay | Admitting: Internal Medicine

## 2010-12-12 DIAGNOSIS — R252 Cramp and spasm: Secondary | ICD-10-CM | POA: Insufficient documentation

## 2010-12-12 NOTE — Assessment & Plan Note (Signed)
Improved still with local red area    Healing  Slowly  Get strips off and fu in a few weeks or if needed. Continue annother 7 days of meds call if worsenign

## 2010-12-12 NOTE — Progress Notes (Signed)
  Subjective:    Patient ID: [name] [name], female    DOB: [birth year], 77 y.o.   MRN: [id number]  HPI Comes in for follow up of   medical issues  WOund infection  Getting better less red sstill has a red ? Oozy area . Slight drainage no fever  COPD  Not worsening  dulera causes se shakiness so stopped this and the spiriva for now!  Leg cramps for a long time mostly left leg  ? What to do .  Had somsthing in hosp that helped.    Review of Systems No fever new problem except as above Past history family history social history reviewed in the electronic medical record.     Objective:   Physical Exam WD thin alert in nad  Looks much  Better today Quiet respirations and no wheezing or coughing. LEG: slight redness around one portion with some  softness of skin but no abscess.  No edema of leg. Either. No clubbing cyanosis or edema      Assessment & Plan:  Wound infection getting better  Some area of concern so extend antibiotic a few more days.  COPD looks better disc with pulm about se of meds  At least take the spiriva shouldn't cause shakiness   LEg cramps ongoing problematic.

## 2010-12-14 ENCOUNTER — Ambulatory Visit (INDEPENDENT_AMBULATORY_CARE_PROVIDER_SITE_OTHER): Payer: Medicare Other | Admitting: Adult Health

## 2010-12-14 ENCOUNTER — Encounter: Payer: Self-pay | Admitting: Adult Health

## 2010-12-14 VITALS — BP 138/60 | HR 80 | Temp 97.7°F | Ht 64.0 in | Wt 102.8 lb

## 2010-12-14 DIAGNOSIS — J449 Chronic obstructive pulmonary disease, unspecified: Secondary | ICD-10-CM

## 2010-12-14 DIAGNOSIS — Z23 Encounter for immunization: Secondary | ICD-10-CM

## 2010-12-14 NOTE — Patient Instructions (Signed)
Pneumovax today.  Continue on Spiriva daily  May stop [facility] for now, if breathing worsens will need to restart and call our office follow up Dr. Vassie Loll  In 2 months and As needed

## 2010-12-14 NOTE — Assessment & Plan Note (Addendum)
Compensated on present regimen.  Will give trial off Dulera - advised if breathing worsens will need to restart.  Declines trial of symbicort.   Plan :  Pneumovax today.  Continue on Spiriva daily  May stop [facility] for now, if breathing worsens will need to restart and call our office follow up Dr. Vassie Loll  In 2 months and As needed

## 2010-12-14 NOTE — Progress Notes (Signed)
Subjective:    Patient ID: [name] [name], female    DOB: [birth year], 77 y.o.   MRN: [id number]  HPI PCP - [name]   83/F,ex - smoker, with CAD, PVD adm 5/31-07/05/10 for hypoxia & LLL atelectasis.  Ct chest showed Volume loss in the inferior left hemithorax. Concurrent filling of the left lower and less so left upper lobe endobronchial  tree.  Bronchoscopy showed LLL endobronchial narrowing, without discrete mass. Papule on rt mainstem bronchus was brushed - cytology all neg. BAL cx, afb, fungal neg  07/16/10 >>Spirometry >>FEV1 61%  CXR - resolved atelecatsis, clearing of infiltrate  She quit smoking in 5/12  10/12/2010 Re- admitted 8/19-8/23/12  for pneumonia & LLL atx. Cx neg. She was tx with aggressive pulm hygiene w/ nebs, o2, IV abx and steroids. Pt underwent FOB this admit which noted thick mucoid secretions with difficulty passing scope. Cytology was neg for malignancy . Path showed AMYLOID DEPOSITION AND DYSTROPHIC CALCIFICATIONS.Marland Kitchen She was tx aggressively with ABX for possible post obstructive PNA , discharged on Avelox for total of 10 days.  >> augmentin + dulera Completed course & still c/o whezing - prednisone taper CT sinuses 8/12 neg Rpt Ct chest -improved aeration LLL, calcified bronchi, no endobronchial lesions  11/05/2010 Acute OV  Pt returns for persistent symptoms. Complains of cough worsened since last ov 3 weeks ago. Complains of productive cough with thick yellow, brown mucus. Complains of SOB with cough, body aches, chest soreness from cough.  Cough has been slowly getting worse , more dyspnea for last 3 days. Over last 3 days coughing up thick yellow mucus most of the day. Mucinex and flutter are not helping. >>rx Augmentin x 10d  12/14/2010 Follow up  Pt returns for follow up . Since last ov, her breathing is at baseline. No increased cough or congestion .  Maintained on Spiriva and Dulera. Wants to come off [facility] . Does not want to try anything else for now.  Says it causes her mouth irritation.  Flu shot is UTD. Pneumovax -she is very unsure if she has ever taken this in past. Not able to find in chart.  Had recent right leg laceration 10/27 on sharp piece of furniture , tx at urgent care. Had secondary infection that was treated by PCP w/ Keflex.  No weight loss or chest pain. No fever or discolored mucus.    Review of Systems   Constitutional:   No  weight loss, night sweats,  Fevers, chills, fatigue, or  lassitude.  HEENT:   No headaches,  Difficulty swallowing,  Tooth/dental problems, or  Sore throat,                No sneezing, itching, ear ache, nasal congestion, post nasal drip,   CV:  No chest pain,  Orthopnea, PND, swelling in lower extremities, anasarca, dizziness, palpitations, syncope.   GI  No heartburn,  abdominal pain, nausea, vomiting, diarrhea, change in bowel habits, loss of appetite, bloody stools.   Resp:   No coughing up of blood.   Marland Kitchen  No chest wall deformity  Skin: no rash or lesions.  GU: no dysuria, change in color of urine, no urgency or frequency.  No flank pain, no hematuria   MS:  No joint pain or swelling.  No decreased range of motion.     Psych:  No change in mood or affect. No depression or anxiety.  No memory loss.    SH:  Widowed (husband passed w/ AA)  3 kids (1 daughter passed with cancer, 1 son in [place], 1 estranged daughter in [place])  Former smoker   Objective:   Physical Exam Gen. Pleasant, thin elderly female  ENT - no lesions, no post nasal drip Neck: No JVD, no thyromegaly, no carotid bruits Lungs: no use of accessory muscles, no dullness to percussion, coarse BS w/ few rhonchi  Cardiovascular: Rhythm regular, heart sounds  normal, no murmurs or gallops, no peripheral edema Musculoskeletal: No deformities, no cyanosis or clubbing         Assessment & Plan:

## 2010-12-14 NOTE — Progress Notes (Signed)
Addended by: Boone Master E on: 12/14/2010 10:18 AM   Modules accepted: Orders

## 2010-12-29 ENCOUNTER — Ambulatory Visit (INDEPENDENT_AMBULATORY_CARE_PROVIDER_SITE_OTHER): Payer: Medicare Other | Admitting: Internal Medicine

## 2010-12-29 ENCOUNTER — Encounter: Payer: Self-pay | Admitting: Internal Medicine

## 2010-12-29 VITALS — BP 120/70 | HR 66 | Temp 98.5°F | Wt 102.0 lb

## 2010-12-29 DIAGNOSIS — J449 Chronic obstructive pulmonary disease, unspecified: Secondary | ICD-10-CM

## 2010-12-29 DIAGNOSIS — L723 Sebaceous cyst: Secondary | ICD-10-CM

## 2010-12-29 DIAGNOSIS — T148XXA Other injury of unspecified body region, initial encounter: Secondary | ICD-10-CM

## 2010-12-29 DIAGNOSIS — L729 Follicular cyst of the skin and subcutaneous tissue, unspecified: Secondary | ICD-10-CM | POA: Insufficient documentation

## 2010-12-29 DIAGNOSIS — L089 Local infection of the skin and subcutaneous tissue, unspecified: Secondary | ICD-10-CM | POA: Insufficient documentation

## 2010-12-29 NOTE — Patient Instructions (Signed)
Warm compresses showers baths . Local care .   Call if  Not better .    Continue local care to leg  Fu with pulmonary as advise  Stable.

## 2010-12-29 NOTE — Assessment & Plan Note (Signed)
No change today.

## 2010-12-29 NOTE — Assessment & Plan Note (Signed)
Much improved today and just superficial. To continue local care and cover .

## 2010-12-29 NOTE — Progress Notes (Signed)
  Subjective:    Patient ID: [name] [name], female    DOB: 11/30/1927, 77 y.o.   MRN: [id number]  HPI Comes in today for an acute visit. She has had increasing painful nodular area on her right buttocks that has become so painful it is difficult for her to sit. This is increased over the last month. She had a abscess cyst that needed to be drained here 3 years ago in the same area. No fever no systemic symptoms or drainage. No GI symptoms.  Leg is healing well no increased pain continuing to cover an antibiotic ointment she is now off antibiotics.  COPD  no change     Review of Systems No fever falling other skin areas.    Objective:   Physical Exam  Well-developed slender in no acute distress today respirations are quiet. Skin : Lower extremity right shows superficial healing injury area without signs of infection slight exudate antibiotic ointment. No edema of the leg. In the right left buttocks area about 3 cm from the anus there is a what appears to be superficial lima beans sized very tender red nodule with a superficial pustule pointing.   Options  procedure discussed; Procedure after discussion 1% epi with lidocaine 1 cc with 27-gauge needle opened with #11 scalpel small amount of discharge yellow extruded plus cystic material. Patient tolerated well size of the cyst decreased but still palpable.      Assessment & Plan:  Infected cyst remote history of same I&D should be able to use local care without additional oral antibiotics but continues topical call if not continuing to improve or recurrence.  Infected laceration much better expect complete healing uneventfully.  COPD no change.

## 2011-01-06 ENCOUNTER — Telehealth: Payer: Self-pay | Admitting: Cardiovascular Disease

## 2011-01-06 NOTE — Telephone Encounter (Signed)
New message:  Pt having severe leg cramps at night.  Is there anything she can take for this?  Please call her back and advise.

## 2011-01-06 NOTE — Telephone Encounter (Signed)
I spoke with the pt and she is having horrible leg cramps that have been ongoing for some time.  The pt wanted to know if there were any medications she could take for leg cramps.  I made her aware that we do not prescribe medications for leg cramps.  I did make her aware that our office could check a BMP to see if her potassium is low.  The pt is going to contact Dr Rosezella [place] office about having labs drawn and getting treatment for her cramps.

## 2011-01-08 ENCOUNTER — Ambulatory Visit (INDEPENDENT_AMBULATORY_CARE_PROVIDER_SITE_OTHER): Payer: Medicare Other | Admitting: Pulmonary Disease

## 2011-01-08 ENCOUNTER — Encounter: Payer: Self-pay | Admitting: Pulmonary Disease

## 2011-01-08 VITALS — BP 112/60 | HR 67 | Temp 98.2°F | Ht 64.0 in | Wt 100.6 lb

## 2011-01-08 DIAGNOSIS — J449 Chronic obstructive pulmonary disease, unspecified: Secondary | ICD-10-CM

## 2011-01-08 DIAGNOSIS — J9819 Other pulmonary collapse: Secondary | ICD-10-CM

## 2011-01-08 DIAGNOSIS — J9811 Atelectasis: Secondary | ICD-10-CM

## 2011-01-08 NOTE — Patient Instructions (Signed)
You are doing well We will arrange for repeat scan in April to follow up on enlarged lymph gland in your chest

## 2011-01-08 NOTE — Progress Notes (Signed)
  Subjective:    Patient ID: [name] [name], female    DOB: 07-30-1927, 77 y.o.   MRN: [id number]  HPI PCP - [name]  83/F,ex - smoker, with CAD, PVD, gold stg 2 COPD & recurrent LLL atelectasis 6/12 >>Spirometry >>FEV1 61%  She quit smoking in 5/12   She was first admitted 6/12 for hypoxia & LLL atelectasis.  Ct chest showed Volume loss in the inferior left hemithorax. Concurrent filling of the left lower and less so left upper lobe endobronchial tree.  Bronchoscopy showed LLL endobronchial narrowing, without discrete mass. Papule on rt mainstem bronchus was brushed - cytology all neg. BAL cx, afb, fungal neg  Re- admitted 8/19-8/23/12 for pneumonia & LLL atx.  Rpt bscopy >>thick mucoid secretions with difficulty passing scope. Cytology was neg for malignancy . Path showed AMYLOID DEPOSITION AND DYSTROPHIC CALCIFICATIONS. CT sinuses 8/12 neg  Rpt Ct chest 9/12 -improved aeration LLL, calcified bronchi, no endobronchial lesions , 1.8 cm subcarinal LN - NEW compared to 8/12  11/05/2010 Acute OV . >>rx Augmentin x 10d   01/08/2011  Pt returns for follow up . Since last ov, her breathing is at baseline. No increased cough or congestion .  Maintained on Spiriva and Dulera. Wants to come off [facility] . Does not want to try anything else for now. Says it causes her mouth irritation.  Flu shot is UTD. Pneumovax -she is very unsure if she has ever taken this in past. Not able to find in chart.  Had recent right leg laceration 10/27 on sharp piece of furniture , tx at urgent care. Had secondary infection that was treated by PCP w/ Keflex.  No weight loss or chest pain. No fever or discolored mucus.  XR KUB done by urologist >> lung appears clear Off dulera      Review of Systems Patient denies significant dyspnea,cough, hemoptysis,  chest pain, palpitations, pedal edema, orthopnea, paroxysmal nocturnal dyspnea, lightheadedness, nausea, vomiting, abdominal or  leg pains      Objective:   Physical Exam Gen. Pleasant, thin woman, in no distress ENT - no lesions, no post nasal drip Neck: No JVD, no thyromegaly, no carotid bruits Lungs: no use of accessory muscles, no dullness to percussion, decreased without rales or rhonchi  Cardiovascular: Rhythm regular, heart sounds  normal, no murmurs or gallops, no peripheral edema Musculoskeletal: No deformities, no cyanosis or clubbing         Assessment & Plan:

## 2011-01-13 ENCOUNTER — Telehealth: Payer: Self-pay | Admitting: Internal Medicine

## 2011-01-13 MED ORDER — VALACYCLOVIR HCL 1 G PO TABS: 1000.0000 mg | ORAL_TABLET | Freq: Three times a day (TID) | ORAL | Status: DC

## 2011-01-13 NOTE — Telephone Encounter (Signed)
Pt thinks she has shingles and also had question about the place she has a cyst drained. Pt wanted to see doctor today. Please contact

## 2011-01-13 NOTE — Telephone Encounter (Signed)
rx sent to pharmacy.  Pt aware.  

## 2011-01-13 NOTE — Telephone Encounter (Signed)
Pt states she has had shingles on and off.  Pt states the rash came up a day ago and started on buttocks in the same spot.  Rash is itching and painful some.  Pt would like Valtrex 1 gm sent to [facility] on [address]. Pls advise.

## 2011-01-15 NOTE — Assessment & Plan Note (Signed)
FEV1 61% 6/12, quit smoking 5/12 Vaccines uptodate Ct spiriva, asmanex Consider roflimulast in future if repeated flares. ? pulm rehab remains an option

## 2011-01-15 NOTE — Assessment & Plan Note (Signed)
Unlikely malignancy since has improved each time. Possibly related to  AMYLOID DEPOSITION AND DYSTROPHIC  CALCIFICATIONS. Is there systemic amyloid? Subcarinal Ln will need 6 mnth FU but most likely reactive.

## 2011-02-15 ENCOUNTER — Telehealth: Payer: Self-pay | Admitting: Cardiology

## 2011-02-15 DIAGNOSIS — I2510 Atherosclerotic heart disease of native coronary artery without angina pectoris: Secondary | ICD-10-CM

## 2011-02-15 MED ORDER — AMLODIPINE BESYLATE 5 MG PO TABS: 5.0000 mg | ORAL_TABLET | Freq: Every day | ORAL | Status: DC

## 2011-02-15 NOTE — Telephone Encounter (Signed)
New Problem:    Patient called in wanting to have a 90 day refill of her amLODipine (NORVASC) 5 MG tablet filled with her pharmacy that she has on file.  Patient would like the 90 day refill because she can get her medication at a cheaper rate when it comes in 90 day refills instead of 30 the 30 day refills.

## 2011-03-01 ENCOUNTER — Other Ambulatory Visit: Payer: Self-pay | Admitting: Gastroenterology

## 2011-03-18 ENCOUNTER — Encounter: Payer: Self-pay | Admitting: Internal Medicine

## 2011-05-18 ENCOUNTER — Telehealth: Payer: Self-pay | Admitting: Pulmonary Disease

## 2011-05-18 NOTE — Telephone Encounter (Signed)
ATC pt line busy x 3 wcb 

## 2011-05-19 ENCOUNTER — Telehealth: Payer: Self-pay | Admitting: Pulmonary Disease

## 2011-05-19 ENCOUNTER — Other Ambulatory Visit (INDEPENDENT_AMBULATORY_CARE_PROVIDER_SITE_OTHER): Payer: Medicare Other

## 2011-05-19 DIAGNOSIS — J9819 Other pulmonary collapse: Secondary | ICD-10-CM

## 2011-05-19 DIAGNOSIS — J9811 Atelectasis: Secondary | ICD-10-CM

## 2011-05-19 LAB — BASIC METABOLIC PANEL
BUN: 25 mg/dL — ABNORMAL HIGH (ref 6–23)
Chloride: 106 mEq/L (ref 96–112)
Glucose, Bld: 137 mg/dL — ABNORMAL HIGH (ref 70–99)
Potassium: 4.5 mEq/L (ref 3.5–5.1)

## 2011-05-19 NOTE — Telephone Encounter (Signed)
precert done through [employer]

## 2011-05-19 NOTE — Telephone Encounter (Signed)
Pt wanted to know the cost of the ct and then it has to be precerted thru [employer]

## 2011-05-19 NOTE — Telephone Encounter (Signed)
Pt showed up and was given 1 sample of dulera and spiriva

## 2011-05-19 NOTE — Telephone Encounter (Signed)
Pt received samples

## 2011-05-19 NOTE — Telephone Encounter (Signed)
Pt is in our lobby re: samples. [name] [name]

## 2011-05-21 ENCOUNTER — Other Ambulatory Visit: Payer: Self-pay | Admitting: *Deleted

## 2011-05-21 DIAGNOSIS — I2510 Atherosclerotic heart disease of native coronary artery without angina pectoris: Secondary | ICD-10-CM

## 2011-05-21 MED ORDER — ISOSORBIDE MONONITRATE ER 30 MG PO TB24: 30.0000 mg | ORAL_TABLET | Freq: Every day | ORAL | Status: DC

## 2011-05-21 NOTE — Telephone Encounter (Signed)
Refilled isosorbide for # 90

## 2011-05-24 ENCOUNTER — Telehealth: Payer: Self-pay | Admitting: Pulmonary Disease

## 2011-05-25 ENCOUNTER — Other Ambulatory Visit: Payer: Self-pay | Admitting: *Deleted

## 2011-05-25 MED ORDER — NITROGLYCERIN 0.4 MG SL SUBL: 0.4000 mg | SUBLINGUAL_TABLET | SUBLINGUAL | Status: DC | PRN

## 2011-05-25 NOTE — Telephone Encounter (Signed)
OV given for RA on 06-15-11 at 2:30 in [facility] office.

## 2011-05-26 ENCOUNTER — Ambulatory Visit (INDEPENDENT_AMBULATORY_CARE_PROVIDER_SITE_OTHER)
Admission: RE | Admit: 2011-05-26 | Discharge: 2011-05-26 | Disposition: A | Discharge status: Home / Self Care | Payer: Medicare Other | Source: Ambulatory Visit | Attending: Pulmonary Disease | Admitting: Pulmonary Disease

## 2011-05-26 DIAGNOSIS — J9811 Atelectasis: Secondary | ICD-10-CM

## 2011-05-26 DIAGNOSIS — J9819 Other pulmonary collapse: Secondary | ICD-10-CM

## 2011-05-26 MED ORDER — IOHEXOL 300 MG/ML  SOLN
80.0000 mL | Freq: Once | INTRAMUSCULAR | Status: AC | PRN
  Administered 2011-05-26: 80 mL via INTRAVENOUS

## 2011-05-31 ENCOUNTER — Ambulatory Visit (INDEPENDENT_AMBULATORY_CARE_PROVIDER_SITE_OTHER): Payer: Medicare Other | Admitting: Internal Medicine

## 2011-05-31 ENCOUNTER — Encounter: Payer: Self-pay | Admitting: Internal Medicine

## 2011-05-31 VITALS — BP 144/70 | HR 80 | Temp 98.2°F | Wt 106.0 lb

## 2011-05-31 DIAGNOSIS — M79672 Pain in left foot: Secondary | ICD-10-CM | POA: Insufficient documentation

## 2011-05-31 DIAGNOSIS — J449 Chronic obstructive pulmonary disease, unspecified: Secondary | ICD-10-CM

## 2011-05-31 DIAGNOSIS — R636 Underweight: Secondary | ICD-10-CM

## 2011-05-31 DIAGNOSIS — I10 Essential (primary) hypertension: Secondary | ICD-10-CM | POA: Insufficient documentation

## 2011-05-31 DIAGNOSIS — J4489 Other specified chronic obstructive pulmonary disease: Secondary | ICD-10-CM

## 2011-05-31 DIAGNOSIS — R739 Hyperglycemia, unspecified: Secondary | ICD-10-CM

## 2011-05-31 DIAGNOSIS — M1990 Unspecified osteoarthritis, unspecified site: Secondary | ICD-10-CM

## 2011-05-31 DIAGNOSIS — R531 Weakness: Secondary | ICD-10-CM

## 2011-05-31 DIAGNOSIS — M2550 Pain in unspecified joint: Secondary | ICD-10-CM

## 2011-05-31 DIAGNOSIS — R7309 Other abnormal glucose: Secondary | ICD-10-CM

## 2011-05-31 DIAGNOSIS — R894 Abnormal immunological findings in specimens from other organs, systems and tissues: Secondary | ICD-10-CM

## 2011-05-31 DIAGNOSIS — M069 Rheumatoid arthritis, unspecified: Secondary | ICD-10-CM

## 2011-05-31 DIAGNOSIS — Z79899 Other long term (current) drug therapy: Secondary | ICD-10-CM

## 2011-05-31 DIAGNOSIS — D649 Anemia, unspecified: Secondary | ICD-10-CM

## 2011-05-31 DIAGNOSIS — R5381 Other malaise: Secondary | ICD-10-CM

## 2011-05-31 LAB — HEPATIC FUNCTION PANEL
ALT: 23 U/L (ref 0–35)
Albumin: 3.7 g/dL (ref 3.5–5.2)
Total Bilirubin: 0.3 mg/dL (ref 0.3–1.2)

## 2011-05-31 LAB — CBC WITH DIFFERENTIAL/PLATELET
Basophils Absolute: 0 10*3/uL (ref 0.0–0.1)
Eosinophils Relative: 1.8 % (ref 0.0–5.0)
HCT: 40.5 % (ref 36.0–46.0)
Hemoglobin: 13.6 g/dL (ref 12.0–15.0)
Lymphs Abs: 1.2 10*3/uL (ref 0.7–4.0)
MCV: 91.5 fl (ref 78.0–100.0)
Monocytes Absolute: 0.4 10*3/uL (ref 0.1–1.0)
Neutro Abs: 4.6 10*3/uL (ref 1.4–7.7)
Platelets: 152 10*3/uL (ref 150.0–400.0)
RDW: 13.9 % (ref 11.5–14.6)

## 2011-05-31 LAB — TSH: TSH: 1.99 u[IU]/mL (ref 0.35–5.50)

## 2011-05-31 LAB — URIC ACID: Uric Acid, Serum: 5.3 mg/dL (ref 2.4–7.0)

## 2011-05-31 LAB — HEMOGLOBIN A1C: Hgb A1c MFr Bld: 5.9 % (ref 4.6–6.5)

## 2011-05-31 NOTE — Patient Instructions (Signed)
I agree this sounds like a heel spur and  planar fasciitis  And will get a consult as we discussed . Continue heel padding .  Stretch   At night  Cold on heel area good padded shoes.   Will notify you  of labs when available.  And will get rheumatology to see you  To help to define your arthritis and back issue  And see if other treatment would help.  Your Blood pressure if acceptable today 130/72 range  Try taking the amlodipine  n the evening and the imdur in the am as you are doing now.

## 2011-05-31 NOTE — Progress Notes (Signed)
Subjective:    Patient ID: [name] [name], female    DOB: July 08, 1927, 78 y.o.   MRN: [id number]  HPI Patient comes in today for follow up of  multiple medical problems.  And new concerns  Concern about  Heel spur   Left heel hurting to walk on no injury or fall using home made padding  Still an issue not related to her claudication.Left heel spur and using padding and  Icy hot.  No injury but hx of same in past has seen Dr Lestine Box  Remotely for a foot problem.  BPup: checked cause of weakness  Feeling   On amlopdipine and    Readings are 130- ocass 170 /50 - 60 diastolic  5/12 elevated on report  thn said elvated bp but hers was lower.  Feels excessively tired over the past few months and increasing hurts all over issue  Arthritis bothering her . NO bleeding . denies appetite change   Review of Systems No chest pain denies shortness of breath except when working in the yard no change in exercise tolerance but states that she is less mobile because of her joint pains. No bruising or bleeding recently no vomiting change in vision. Past history family history social history reviewed in the electronic medical record. Outpatient Encounter Prescriptions as of 05/31/2011  Medication Sig Dispense Refill  . amLODipine (NORVASC) 5 MG tablet Take 1 tablet (5 mg total) by mouth daily.  90 tablet  3  . aspirin 81 MG EC tablet Take 81 mg by mouth daily.        . Cholecalciferol (VITAMIN D) 1000 UNITS capsule Take 1,000 Units by mouth daily.        . fish oil-omega-3 fatty acids 1000 MG capsule Take 2 g by mouth daily.        . isosorbide mononitrate (IMDUR) 30 MG 24 hr tablet Take 1 tablet (30 mg total) by mouth daily.  90 tablet  3  . Loteprednol-Tobramycin (ZYLET) 0.5-0.3 % SUSP Apply to eye. Use 4 times a day      . Mometasone Furo-Formoterol Fum 200-5 MCG/ACT AERO Inhale 2 puffs into the lungs daily.        . nitroGLYCERIN (NITROSTAT) 0.4 MG SL tablet Place 1 tablet (0.4 mg total) under the  tongue every 5 (five) minutes as needed.  25 tablet  3  . Propylene Glycol (SYSTANE BALANCE) 0.6 % SOLN Apply to eye 2 (two) times daily as needed.       . tiotropium (SPIRIVA HANDIHALER) 18 MCG inhalation capsule Place 1 capsule (18 mcg total) into inhaler and inhale daily.  30 capsule  11  . traMADol (ULTRAM) 50 MG tablet TAKE TWO TABLETS BY MOUTH EVERY SIX HOURS AS NEEDED  30 tablet  0  . nystatin (MYCOSTATIN) 100000 UNIT/ML suspension 4-6 cc swish and swallow  Qid for  2 weeks or prn  240 mL  1  . valACYclovir (VALTREX) 1000 MG tablet Take 1 tablet (1,000 mg total) by mouth 3 (three) times daily.  21 tablet  0  . VOLTAREN 1 % GEL APPLY 2 GRAMS TO UPPER EXTREMITIES UP TO 4 TIMES DAILY FOR ARTHRITISPAIN.MAX 8 GM PER DAY PER JOINT.  100 g  1       Objective:   Physical Exam BP 144/70  Pulse 80  Temp(Src) 98.2 F (36.8 C) (Oral)  Wt 106 lb (48.081 kg)  SpO2 97%  repeat 130/76 WD slender alert in no acute distress complaining of feeling tired.  Respirations are unlabored however occasionally loses cough Very slender  Gait mildly antalgic  Chest  bs distant no wheezing . Cor rr  Left heel tender at plantar insertion no swelling or skin changes no ulcers or redness. No clubbing cyanosis or edema Ext oa changes no redness or warmth .  Reviewed bp log  Oriented x 3 and no noted deficits in memory, attention, and speech.     Assessment & Plan:  generalized weak feeling some joint pains Patient states going on for a few months but worse over the last days.  under care for her COPD and coronary artery disease but doesn't think that's related. She has peripheral vascular disease but does not feel that progressing.  She complains of increasing arthritis symptoms left arm cold feeling back problems hurting all over. She is only taking tramadol occasionally and has not been falling.  In this setting she is developed left heel pain which is consistent with plantar fasciitis heel spur  situation however she's not a candidate for anti-inflammatories she has used some local modalities  But this is interfering also with her activity level.  We'll check a full set of labs to include inflammatory markers to check for PMR risk vitamin D level anemia B12 etc.   if unrevealing we'll get a rheumatologic consult to make sure we are not missing an underlying inflammatory treatable condition.   In the meantime I believe her blood pressure is acceptable as is okay in the office maybe increase from pain we'll have her take her amlodipine in the evening and her Imdur in the morning. I feel that increase blood pressure medication at this time may cause hypotension.

## 2011-06-01 LAB — VITAMIN D 25 HYDROXY (VIT D DEFICIENCY, FRACTURES): Vit D, 25-Hydroxy: 50 ng/mL (ref 30–89)

## 2011-06-01 LAB — C-REACTIVE PROTEIN: CRP: 0.27 mg/dL (ref <0.60)

## 2011-06-03 NOTE — Progress Notes (Signed)
Quick Note:  Pt aware ______ 

## 2011-06-03 NOTE — Progress Notes (Signed)
Quick Note:  Referral ordered.  ______ 

## 2011-06-05 ENCOUNTER — Encounter: Payer: Self-pay | Admitting: Internal Medicine

## 2011-06-05 DIAGNOSIS — R894 Abnormal immunological findings in specimens from other organs, systems and tissues: Secondary | ICD-10-CM | POA: Insufficient documentation

## 2011-06-15 ENCOUNTER — Encounter: Payer: Self-pay | Admitting: Pulmonary Disease

## 2011-06-15 ENCOUNTER — Ambulatory Visit (INDEPENDENT_AMBULATORY_CARE_PROVIDER_SITE_OTHER): Payer: Medicare Other | Admitting: Pulmonary Disease

## 2011-06-15 VITALS — BP 130/60 | HR 69 | Temp 98.1°F | Ht 64.0 in | Wt 108.0 lb

## 2011-06-15 DIAGNOSIS — J4489 Other specified chronic obstructive pulmonary disease: Secondary | ICD-10-CM

## 2011-06-15 DIAGNOSIS — J449 Chronic obstructive pulmonary disease, unspecified: Secondary | ICD-10-CM

## 2011-06-15 NOTE — Patient Instructions (Signed)
Stay on spiriva CT scan shows mucus build up in left lung Take mucinex  Daily x  2 weeks

## 2011-06-15 NOTE — Assessment & Plan Note (Signed)
FEV1 61% 6/12, quit smoking 5/12 Lymphadenopathy resolved on CT - doubt malignancy ?amyloid on bronchial biopsy Stay on spiriva CT scan shows mucus build up in left lung Take mucinex  Daily x  2 weeks

## 2011-06-15 NOTE — Progress Notes (Signed)
  Subjective:    Patient ID: [name] [name], female    DOB: March 12, 1927, 78 y.o.   MRN: [id number]  HPI  PCP - [name]  83/F,ex - smoker, with CAD, PVD, gold stg 2 COPD & recurrent LLL atelectasis 6/12 >>Spirometry >>FEV1 61%  She quit smoking in 5/12   She was first admitted 6/12 for hypoxia & LLL atelectasis.  Ct chest showed Volume loss in the inferior left hemithorax. Concurrent filling of the left lower and less so left upper lobe endobronchial tree.  Bronchoscopy showed LLL endobronchial narrowing, without discrete mass. Papule on rt mainstem bronchus was brushed - cytology all neg. BAL cx, afb, fungal neg  Re- admitted 8/19-8/23/12 for pneumonia & LLL atx.  Rpt bscopy >>thick mucoid secretions with difficulty passing scope. Cytology was neg for malignancy . Path showed AMYLOID DEPOSITION AND DYSTROPHIC CALCIFICATIONS. CT sinuses 8/12 neg  Rpt Ct chest 9/12 -improved aeration LLL, calcified bronchi, no endobronchial lesions , 1.8 cm subcarinal LN - NEW compared to 8/12  11/05/2010 Acute OV . >>rx Augmentin x 10d     06/15/2011 Patient presents to discuss CT results.>>No residual lymphadenopathy. No evidence of metastatic breast cancer.  Stable chronic lung disease with biapical scarring, diffuse  calcifications of the tracheobronchial tree and mucous impaction of  the lingular bronchus Dulera  caused her mouth irritation & shakes She seems to have self stopped Asmanex   c/o chest congestion and left shoulder blade pain.   Review of Systems Patient denies significant dyspnea,cough, hemoptysis,  chest pain, palpitations, pedal edema, orthopnea, paroxysmal nocturnal dyspnea, lightheadedness, nausea, vomiting, abdominal or  leg pains      Objective:   Physical Exam  Gen. Pleasant, thin woman, in no distress ENT - no lesions, no post nasal drip Neck: No JVD, no thyromegaly, no carotid bruits Lungs: no use of accessory muscles, no dullness to percussion, decreased without  rales or rhonchi  Cardiovascular: Rhythm regular, heart sounds  normal, no murmurs or gallops, no peripheral edema Musculoskeletal: No deformities, no cyanosis or clubbing        Assessment & Plan:

## 2011-06-24 ENCOUNTER — Telehealth: Payer: Self-pay | Admitting: Pulmonary Disease

## 2011-06-24 MED ORDER — AZITHROMYCIN 250 MG PO TABS: ORAL_TABLET | ORAL | Status: AC

## 2011-06-24 MED ORDER — LEVALBUTEROL TARTRATE 45 MCG/ACT IN AERO: 2.0000 | INHALATION_SPRAY | Freq: Four times a day (QID) | RESPIRATORY_TRACT | Status: DC | PRN

## 2011-06-24 NOTE — Telephone Encounter (Signed)
I spoke with pt and she c/o cough w/ yellow phlem, wheezing, and chest tightness, and chest congestion x couple days. Denies any sob, f/c/s/n/v. She has been taking mucinex 600 QD since her last OV w/ RA on 06/15/11. Her ortho doctor put her on prednisone 5 mg taper #48 tablets on Monday. Also pt states last year she was on a xopenex inhaler that was given to her from the hospital and is wanting to know if okay to get back on this for rescue inhaler. Please advise Dr. Vassie Loll, thanks  Allergies  Allergen Reactions  . Albuterol     Shakes   . Nicoderm (Nicotine)     GI Upset  . Oxycodone   . Prednisone     Shakes  . Tequin   . Verapamil     REACTION: CONSTIPATION     [facility] [address]

## 2011-06-24 NOTE — Telephone Encounter (Signed)
Ok for xopenex MDI - 2 puffs q 6h prn Complete prednisone Send Rx for z-pak

## 2011-06-24 NOTE — Telephone Encounter (Signed)
Spoke with pt and notified of recs per RA. She verbalized understanding and states no questions. I advised to call or seek emergent care if not improving and pt verbalized understanding of this. Rxs were sent to pharm.

## 2011-07-13 ENCOUNTER — Ambulatory Visit (INDEPENDENT_AMBULATORY_CARE_PROVIDER_SITE_OTHER): Payer: Medicare Other | Admitting: Internal Medicine

## 2011-07-13 ENCOUNTER — Encounter: Payer: Self-pay | Admitting: Internal Medicine

## 2011-07-13 VITALS — BP 144/70 | HR 73 | Temp 98.5°F | Wt 107.0 lb

## 2011-07-13 DIAGNOSIS — J449 Chronic obstructive pulmonary disease, unspecified: Secondary | ICD-10-CM

## 2011-07-13 DIAGNOSIS — G4700 Insomnia, unspecified: Secondary | ICD-10-CM

## 2011-07-13 DIAGNOSIS — M129 Arthropathy, unspecified: Secondary | ICD-10-CM

## 2011-07-13 DIAGNOSIS — I10 Essential (primary) hypertension: Secondary | ICD-10-CM

## 2011-07-13 DIAGNOSIS — R636 Underweight: Secondary | ICD-10-CM

## 2011-07-13 DIAGNOSIS — M1990 Unspecified osteoarthritis, unspecified site: Secondary | ICD-10-CM

## 2011-07-13 DIAGNOSIS — I739 Peripheral vascular disease, unspecified: Secondary | ICD-10-CM

## 2011-07-13 MED ORDER — TRAMADOL HCL 50 MG PO TABS: 50.0000 mg | ORAL_TABLET | Freq: Three times a day (TID) | ORAL | Status: DC | PRN

## 2011-07-13 NOTE — Progress Notes (Signed)
Subjective:    Patient ID: [name] [name], female    DOB: 05-13-1927, 78 y.o.   MRN: [id number]  HPI Patient comes in today for follow up of  multiple medical problems.   Never got to rheum appt   Saw [facility]So plantar fasciiits and then resp infection. Lungs flared and had 12 days pred with help and also helped her joints.  NO fever no symcope. Sleep frequent wakening doesn't think its from her lungs  Stable left claudications. No falling.  Skin areas  itch and check to see derm.  Review of Systems No fever vomiting new resp sx . Cp  new rashes  Gets ocass leg cramps   Past history family history social history reviewed in the electronic medical record. Outpatient Encounter Prescriptions as of 07/13/2011  Medication Sig Dispense Refill  . amLODipine (NORVASC) 5 MG tablet Take 1 tablet (5 mg total) by mouth daily.  90 tablet  3  . aspirin 81 MG EC tablet Take 81 mg by mouth daily.        . Cholecalciferol (VITAMIN D) 1000 UNITS capsule Take 1,000 Units by mouth daily.        . fish oil-omega-3 fatty acids 1000 MG capsule Take 2 g by mouth daily.        Marland Kitchen guaiFENesin (MUCINEX) 600 MG 12 hr tablet Take 1,200 mg by mouth 1 day or 1 dose.      . isosorbide mononitrate (IMDUR) 30 MG 24 hr tablet Take 1 tablet (30 mg total) by mouth daily.  90 tablet  3  . levalbuterol (XOPENEX HFA) 45 MCG/ACT inhaler Inhale 2 puffs into the lungs every 6 (six) hours as needed.  1 Inhaler  2  . nitroGLYCERIN (NITROSTAT) 0.4 MG SL tablet Place 1 tablet (0.4 mg total) under the tongue every 5 (five) minutes as needed.  25 tablet  3  . tiotropium (SPIRIVA HANDIHALER) 18 MCG inhalation capsule Place 1 capsule (18 mcg total) into inhaler and inhale daily.  30 capsule  11  . traMADol (ULTRAM) 50 MG tablet Take 1 tablet (50 mg total) by mouth every 8 (eight) hours as needed for pain.  40 tablet  0  . DISCONTD: traMADol (ULTRAM) 50 MG tablet TAKE TWO TABLETS BY MOUTH EVERY SIX HOURS AS NEEDED  30 tablet  0  .  Mometasone Furo-Formoterol Fum 200-5 MCG/ACT AERO Inhale 2 puffs into the lungs daily.        Marland Kitchen DISCONTD: Loteprednol-Tobramycin (ZYLET) 0.5-0.3 % SUSP Apply to eye. Use 4 times a day      . DISCONTD: nystatin (MYCOSTATIN) 100000 UNIT/ML suspension 4-6 cc swish and swallow  Qid for  2 weeks or prn  240 mL  1  . DISCONTD: Propylene Glycol (SYSTANE BALANCE) 0.6 % SOLN Apply to eye 2 (two) times daily as needed.       Marland Kitchen DISCONTD: valACYclovir (VALTREX) 1000 MG tablet Take 1 tablet (1,000 mg total) by mouth 3 (three) times daily.  21 tablet  0  . DISCONTD: VOLTAREN 1 % GEL APPLY 2 GRAMS TO UPPER EXTREMITIES UP TO 4 TIMES DAILY FOR ARTHRITISPAIN.MAX 8 GM PER DAY PER JOINT.  100 g  1        Objective:   Physical Exam  Wt Readings from Last 3 Encounters:  07/13/11 107 lb (48.535 kg)  06/15/11 108 lb (48.988 kg)  05/31/11 106 lb (48.081 kg)     BP 144/70  Pulse 73  Temp(Src) 98.5 F (36.9 C) (Oral)  Wt 107 lb (48.535 kg)  SpO2 98% Repeat bp right 130/70 sitting  Wd slender in nad speech normal  Good color  . Neck: Supple without adenopathy or masses or bruits Chest dec bs few larger airway sound  CV rr no g or m no jvd SKIN: sun and senil changes no suspicious areas otherwise . No edema no redness of joints hands with some deformity oa changes and some mcp involvement Lab Results  Component Value Date   WBC 6.4 05/31/2011   HGB 13.6 05/31/2011   HCT 40.5 05/31/2011   PLT 152.0 05/31/2011   GLUCOSE 137* 05/19/2011   CHOL 191 11/10/2009   TRIG 91.0 11/10/2009   HDL 49.50 11/10/2009   LDLCALC 123* 11/10/2009   ALT 23 05/31/2011   AST 31 05/31/2011   NA 143 05/19/2011   K 4.5 05/19/2011   CL 106 05/19/2011   CREATININE 0.8 05/19/2011   BUN 25* 05/19/2011   CO2 27 05/19/2011   TSH 1.99 05/31/2011   INR 1.04 09/20/2010   HGBA1C 5.9 05/31/2011       Assessment & Plan:  Joint pains  Elevated ccrp   Never got to the specialist because of competing problems   12 days of pred helped the  joints.  Cost of co pays but will reschedule  Ok to take ultram prn with caution resp severe COPD recent  Flare.  Stable again Underweight  HT ok   Today  Fu  PVD no change Sleep reviewed sleep hygiene do not fall asleep with tv on.  ROV in 3 months or so    Total visit > 50% spent counseling and coordinating care

## 2011-07-13 NOTE — Patient Instructions (Signed)
Your blood pressure is ok today.  I  still want  You to see  The rheumatologist.   Ok to take tramadol as needed.

## 2011-07-19 ENCOUNTER — Encounter: Payer: Self-pay | Admitting: Cardiology

## 2011-07-19 ENCOUNTER — Ambulatory Visit (INDEPENDENT_AMBULATORY_CARE_PROVIDER_SITE_OTHER): Payer: Medicare Other | Admitting: Cardiology

## 2011-07-19 VITALS — BP 158/65 | HR 70 | Wt 103.0 lb

## 2011-07-19 DIAGNOSIS — I2510 Atherosclerotic heart disease of native coronary artery without angina pectoris: Secondary | ICD-10-CM

## 2011-07-19 DIAGNOSIS — I701 Atherosclerosis of renal artery: Secondary | ICD-10-CM

## 2011-07-19 DIAGNOSIS — I10 Essential (primary) hypertension: Secondary | ICD-10-CM

## 2011-07-19 DIAGNOSIS — I6529 Occlusion and stenosis of unspecified carotid artery: Secondary | ICD-10-CM

## 2011-07-19 DIAGNOSIS — E7800 Pure hypercholesterolemia, unspecified: Secondary | ICD-10-CM

## 2011-07-19 DIAGNOSIS — I739 Peripheral vascular disease, unspecified: Secondary | ICD-10-CM

## 2011-07-19 DIAGNOSIS — E785 Hyperlipidemia, unspecified: Secondary | ICD-10-CM

## 2011-07-19 MED ORDER — PRAVASTATIN SODIUM 20 MG PO TABS: 20.0000 mg | ORAL_TABLET | Freq: Every evening | ORAL | Status: DC

## 2011-07-19 NOTE — Assessment & Plan Note (Signed)
Add pravachol 20 mg daily; check lipids and liver in six weeks; note elevated LFTS in past.

## 2011-07-19 NOTE — Patient Instructions (Addendum)
Your physician recommends that you schedule a follow-up appointment in: 12 months with Dr. Jens Som. The office will mail you a reminder letter 2 months prior appointment date. Your physician has recommended you make the following change in your medication:  Take Pravachol 20 mg one tablet by mouth daily. Your physician recommends that you return for lab work in: Fasting lipids and LFT in 6 weeks. Appointment is on July 29 th in am.

## 2011-07-19 NOTE — Progress Notes (Signed)
HPI: [name] is a pleasant female who has a history of coronary artery disease, status post stenting of the right coronary artery in December 2005. She also has a history of cerebrovascular disease. Her most recent carotid Dopplers were performed in Dec 2011. There was a 40-59% bilateral stenosis. Followup was recommended in 2 years. She also has a history of renal artery stenosis. Last renal Dopplers were in August 2010. Renal arteries showed no stenosis. There was a right renal mass. However dedicated ultrasound showed a renal cyst. She also has peripheral vascular disease and has seen Dr. Excell Seltzer. An arteriogram revealed occlusion of the left SFA. It was unfavorable for intervention and medical therapy was recommended if possible. ABIs in Sept 2012 showed moderate decrease on the left and normal on the right. Patient had cardiac catheterization in November of 2011 secondary to chest pain which revealed left main normal. The LAD had proximal luminal irregularities. First diagonal was large with ostial 25% stenosis. Circumflex in the AV groove had mid long 25% stenosis. Obtuse marginal was large and normal. The right coronary artery with long in-stent 30% stenosis. PDA was moderate size and normal. Posterolateral was small and normal. EF 65. Echo in June of 2012 showed normal LV function, mild AI and MR. Since I last saw her in Sept 2012, there is no increased dyspnea on exertion, orthopnea, PND, pedal edema, syncope or chest pain. She does have weakness in her left lower extremity with ambulation.   Current Outpatient Prescriptions  Medication Sig Dispense Refill  . amLODipine (NORVASC) 5 MG tablet Take 1 tablet (5 mg total) by mouth daily.  90 tablet  3  . aspirin 81 MG EC tablet Take 81 mg by mouth daily.        . Cholecalciferol (VITAMIN D) 1000 UNITS capsule Take 1,000 Units by mouth daily.        . fish oil-omega-3 fatty acids 1000 MG capsule Take 2 g by mouth daily.        Marland Kitchen guaiFENesin  (MUCINEX) 600 MG 12 hr tablet Take 1,200 mg by mouth 1 day or 1 dose.      . isosorbide mononitrate (IMDUR) 30 MG 24 hr tablet Take 1 tablet (30 mg total) by mouth daily.  90 tablet  3  . levalbuterol (XOPENEX HFA) 45 MCG/ACT inhaler Inhale 2 puffs into the lungs every 6 (six) hours as needed.  1 Inhaler  2  . Mometasone Furo-Formoterol Fum 200-5 MCG/ACT AERO Inhale 2 puffs into the lungs daily.        . nitroGLYCERIN (NITROSTAT) 0.4 MG SL tablet Place 1 tablet (0.4 mg total) under the tongue every 5 (five) minutes as needed.  25 tablet  3  . tiotropium (SPIRIVA HANDIHALER) 18 MCG inhalation capsule Place 1 capsule (18 mcg total) into inhaler and inhale daily.  30 capsule  11  . traMADol (ULTRAM) 50 MG tablet Take 1 tablet (50 mg total) by mouth every 8 (eight) hours as needed for pain.  40 tablet  0     Past Medical History  Diagnosis Date  . POSTHERPETIC NEURALGIA 08/25/2006  . CARCINOMA, BASAL CELL 02/13/2006  . NEOPLASM, SKIN, UNCERTAIN BEHAVIOR 12/16/2008  . HYPOTHYROIDISM 01/30/2007  . Unspecified vitamin D deficiency 02/07/2007  . HYPERLIPIDEMIA 03/10/2008  . UNSPECIFIED ANEMIA 03/18/2008  . THROMBOCYTOPENIA 07/30/2008  . LOSS, HEARING NOS 08/25/2006  . HYPERTENSION 02/13/2006  . UNSTABLE ANGINA 12/12/2009  . CORONARY ARTERY DISEASE 02/13/2006  . CAROTID ARTERY DISEASE 10/03/2006  .  RENAL ARTERY STENOSIS 08/01/2008  . PERIPHERAL VASCULAR DISEASE 02/13/2006  . Chronic rhinitis 12/13/2006  . CHRONIC OBSTRUCTIVE PULMONARY DISEASE, ACUTE EXACERBATION 11/17/2009  . DERMATITIS 09/25/2007  . DEGENERATIVE JOINT DISEASE 09/27/2006  . OSTEOARTHRITIS, HAND 12/16/2008  . SPINAL STENOSIS 11/01/2006  . LIVER FUNCTION TESTS, ABNORMAL 01/22/2009  . UNS ADVRS EFF UNS RX MEDICINAL&BIOLOGICAL SBSTNC 01/30/2007  . Personal history of malignant neoplasm of breast 07/24/2008  . NEPHROLITHIASIS, HX OF 02/27/2010  . Vitreous detachment   . Shingles     recurrent  . Head trauma     closed  . Personal history of  urinary calculi     Past Surgical History  Procedure Date  . Lumbar laminectomy   . Cataract extraction   . Abdominal hysterectomy   . Breast lumpectomy   . Tonsillectomy   . Coronary angioplasty with stent placement     bilat iliac stents, left renal artery and rt coronary artery last myoview 8/09 with EF 70%  . Skin cancer excision     History   Social History  . Marital Status: Widowed    Spouse Name: N/A    Number of Children: N/A  . Years of Education: N/A   Occupational History  . Not on file.   Social History Main Topics  . Smoking status: Former Smoker -- 1.0 packs/day for 30 years    Types: Cigarettes    Quit date: 06/15/2010  . Smokeless tobacco: Never Used  . Alcohol Use: Yes     socially  . Drug Use: No  . Sexually Active: Not on file   Other Topics Concern  . Not on file   Social History Narrative   Widowed5 hours of sleepHas friends checking on herNo Pets    ROS: Low back pain and arthralgias but no fevers or chills, productive cough, hemoptysis, dysphasia, odynophagia, melena, hematochezia, dysuria, hematuria, rash, seizure activity, orthopnea, PND, pedal edema, claudication. Remaining systems are negative.  Physical Exam: Well-developed frail in no acute distress.  Skin is warm and dry. Ecchymosis noted HEENT is normal.  Neck is supple.  Chest with diffuse rhonchi  Cardiovascular exam is regular rate and rhythm.  Abdominal exam nontender or distended. No masses palpated. Extremities show no edema. neuro grossly intact  ECG NSR with nonspecific ST changes, CRO prior septal MI

## 2011-07-19 NOTE — Assessment & Plan Note (Signed)
Continue ASA; add statin.

## 2011-07-19 NOTE — Assessment & Plan Note (Signed)
Continue ASA and statin  

## 2011-07-19 NOTE — Assessment & Plan Note (Signed)
Continue ASA; fu carotid dopplers 12/13.

## 2011-07-19 NOTE — Assessment & Plan Note (Signed)
Continue present meds and follow; add additional meds as needed.

## 2011-07-19 NOTE — Assessment & Plan Note (Signed)
Continue ASA; add pravachol (patient did not tolerate crestor or lipitor in past). Watch for side effects.

## 2011-08-12 ENCOUNTER — Other Ambulatory Visit: Payer: Self-pay | Admitting: Dermatology

## 2011-08-30 ENCOUNTER — Other Ambulatory Visit: Payer: Self-pay | Admitting: *Deleted

## 2011-08-30 ENCOUNTER — Encounter: Payer: Self-pay | Admitting: *Deleted

## 2011-08-30 ENCOUNTER — Other Ambulatory Visit (INDEPENDENT_AMBULATORY_CARE_PROVIDER_SITE_OTHER): Payer: Medicare Other

## 2011-08-30 DIAGNOSIS — E785 Hyperlipidemia, unspecified: Secondary | ICD-10-CM

## 2011-08-30 DIAGNOSIS — I10 Essential (primary) hypertension: Secondary | ICD-10-CM

## 2011-08-30 LAB — LIPID PANEL
Cholesterol: 159 mg/dL (ref 0–200)
LDL Cholesterol: 83 mg/dL (ref 0–99)
Triglycerides: 53 mg/dL (ref 0.0–149.0)
VLDL: 10.6 mg/dL (ref 0.0–40.0)

## 2011-08-30 LAB — HEPATIC FUNCTION PANEL
ALT: 40 U/L — ABNORMAL HIGH (ref 0–35)
AST: 46 U/L — ABNORMAL HIGH (ref 0–37)
Alkaline Phosphatase: 121 U/L — ABNORMAL HIGH (ref 39–117)
Total Bilirubin: 0.6 mg/dL (ref 0.3–1.2)

## 2011-10-08 ENCOUNTER — Other Ambulatory Visit: Payer: Self-pay | Admitting: Cardiology

## 2011-10-08 ENCOUNTER — Other Ambulatory Visit: Payer: Self-pay | Admitting: *Deleted

## 2011-10-08 DIAGNOSIS — I6529 Occlusion and stenosis of unspecified carotid artery: Secondary | ICD-10-CM

## 2011-10-08 DIAGNOSIS — I739 Peripheral vascular disease, unspecified: Secondary | ICD-10-CM

## 2011-10-12 ENCOUNTER — Encounter (INDEPENDENT_AMBULATORY_CARE_PROVIDER_SITE_OTHER): Payer: Medicare Other

## 2011-10-12 DIAGNOSIS — I6529 Occlusion and stenosis of unspecified carotid artery: Secondary | ICD-10-CM

## 2011-10-14 ENCOUNTER — Ambulatory Visit (INDEPENDENT_AMBULATORY_CARE_PROVIDER_SITE_OTHER): Payer: Medicare Other | Admitting: Cardiovascular Disease

## 2011-10-14 ENCOUNTER — Encounter (INDEPENDENT_AMBULATORY_CARE_PROVIDER_SITE_OTHER): Payer: Medicare Other

## 2011-10-14 ENCOUNTER — Ambulatory Visit: Payer: Medicare Other | Admitting: Cardiovascular Disease

## 2011-10-14 ENCOUNTER — Encounter: Payer: Self-pay | Admitting: Cardiovascular Disease

## 2011-10-14 VITALS — BP 170/70 | HR 60 | Ht 64.0 in | Wt 105.8 lb

## 2011-10-14 DIAGNOSIS — I739 Peripheral vascular disease, unspecified: Secondary | ICD-10-CM

## 2011-10-14 DIAGNOSIS — I70219 Atherosclerosis of native arteries of extremities with intermittent claudication, unspecified extremity: Secondary | ICD-10-CM

## 2011-10-14 NOTE — Patient Instructions (Signed)
Please schedule patient for an aorto-iliac duplex.  Your physician wants you to follow-up in: 1 YEAR.  You will receive a reminder letter in the mail two months in advance. If you don't receive a letter, please call our office to schedule the follow-up appointment.  Your physician recommends that you continue on your current medications as directed. Please refer to the Current Medication list given to you today.

## 2011-10-14 NOTE — Progress Notes (Signed)
HPI:  78 year old woman presenting for followup evaluation. She was last seen in August 2012. She has lower extremity peripheral arterial disease and has undergone previous iliac stenting. She also has coronary disease and has undergone stenting of the right coronary artery in 2005. Her cardiac issues are followed by Dr. Jens Som.  The patient continues to have bilateral leg pain. She has typical and atypical symptoms. She has pain in her calves with walking, but she is not doing much routine walking at present time. Her calf pain feels like an ache. She also has shooting pains down the leg they're unrelated to exertion. She's had a lot of back problems and actually her main complaint is "leg weakness."  The patient had ABIs with arterial waveforms today demonstrating an ABI of 53% on the left and 73% on the right. The right side represents a significant change from previous.  She denies chest pain, chest pressure, or dyspnea.  Outpatient Encounter Prescriptions as of 10/14/2011  Medication Sig Dispense Refill  . amLODipine (NORVASC) 5 MG tablet Take 1 tablet (5 mg total) by mouth daily.  90 tablet  3  . aspirin 81 MG EC tablet Take 81 mg by mouth daily.        . Cholecalciferol (VITAMIN D) 1000 UNITS capsule Take 1,000 Units by mouth daily.        . fish oil-omega-3 fatty acids 1000 MG capsule Take 2 g by mouth daily.        Marland Kitchen guaiFENesin (MUCINEX) 600 MG 12 hr tablet Take 1,200 mg by mouth 1 day or 1 dose.      . isosorbide mononitrate (IMDUR) 30 MG 24 hr tablet Take 1 tablet (30 mg total) by mouth daily.  90 tablet  3  . levalbuterol (XOPENEX HFA) 45 MCG/ACT inhaler Inhale 2 puffs into the lungs every 6 (six) hours as needed.  1 Inhaler  2  . Mometasone Furo-Formoterol Fum 200-5 MCG/ACT AERO Inhale 2 puffs into the lungs daily.        . nitroGLYCERIN (NITROSTAT) 0.4 MG SL tablet Place 1 tablet (0.4 mg total) under the tongue every 5 (five) minutes as needed.  25 tablet  3  . tiotropium  (SPIRIVA HANDIHALER) 18 MCG inhalation capsule Place 1 capsule (18 mcg total) into inhaler and inhale daily.  30 capsule  11  . traMADol (ULTRAM) 50 MG tablet Take 1 tablet (50 mg total) by mouth every 8 (eight) hours as needed for pain.  40 tablet  0    Allergies  Allergen Reactions  . Oxycodone   . Statins   . Tequin     Past Medical History  Diagnosis Date  . POSTHERPETIC NEURALGIA 08/25/2006  . CARCINOMA, BASAL CELL 02/13/2006  . NEOPLASM, SKIN, UNCERTAIN BEHAVIOR 12/16/2008  . HYPOTHYROIDISM 01/30/2007  . Unspecified vitamin D deficiency 02/07/2007  . HYPERLIPIDEMIA 03/10/2008  . UNSPECIFIED ANEMIA 03/18/2008  . THROMBOCYTOPENIA 07/30/2008  . LOSS, HEARING NOS 08/25/2006  . HYPERTENSION 02/13/2006  . UNSTABLE ANGINA 12/12/2009  . CORONARY ARTERY DISEASE 02/13/2006  . CAROTID ARTERY DISEASE 10/03/2006  . RENAL ARTERY STENOSIS 08/01/2008  . PERIPHERAL VASCULAR DISEASE 02/13/2006  . Chronic rhinitis 12/13/2006  . CHRONIC OBSTRUCTIVE PULMONARY DISEASE, ACUTE EXACERBATION 11/17/2009  . DERMATITIS 09/25/2007  . DEGENERATIVE JOINT DISEASE 09/27/2006  . OSTEOARTHRITIS, HAND 12/16/2008  . SPINAL STENOSIS 11/01/2006  . LIVER FUNCTION TESTS, ABNORMAL 01/22/2009  . UNS ADVRS EFF UNS RX MEDICINAL&BIOLOGICAL SBSTNC 01/30/2007  . Personal history of malignant neoplasm of breast 07/24/2008  .  NEPHROLITHIASIS, HX OF 02/27/2010  . Vitreous detachment   . Shingles     recurrent  . Head trauma     closed  . Personal history of urinary calculi    BP 170/70  Pulse 60  Ht 5\' 4"  (1.626 m)  Wt 47.991 kg (105 lb 12.8 oz)  BMI 18.16 kg/m2  PHYSICAL EXAM: Pt is alert and oriented, thin, frail woman in NAD HEENT: normal Neck: JVP - normal, carotids 2+= with soft bilateral bruits Lungs: CTA bilaterally CV: RRR without murmur or gallop Abd: soft, NT, Positive BS, no hepatomegaly Ext: no C/C/E, feet are warm but pedal pulses nonpalpable Skin: warm/dry no rash  EKG:  Normal sinus rhythm 60 beats per  minute, nonspecific ST abnormality  ASSESSMENT AND PLAN: 1. Lower extremity peripheral arterial disease with intermittent claudication. I have personally reviewed her ABIs and waveforms performed earlier today. She has some decrease in the amplitude of her common femoral waveforms and it is possible that she has iliac restenosis. We know she has long occlusion of her left SFA, but her right ABI has dropped significantly. She will be scheduled for aortoiliac ultrasound to evaluate her iliac stent and make sure she hasn't developed significant proximal stenosis. Further plans based on the results of that study. She will continue her current medical program. She has been counseled extensively on complete tobacco cessation, and she is smoking much less than in the past but I don't think she'll be able to fully quit.  2. Coronary artery disease. Stable without angina. She continues to follow with Dr. Jens Som.  Tonny Bollman 10/14/2011 6:04 PM

## 2011-10-18 ENCOUNTER — Encounter (INDEPENDENT_AMBULATORY_CARE_PROVIDER_SITE_OTHER): Payer: Medicare Other

## 2011-10-18 DIAGNOSIS — I700 Atherosclerosis of aorta: Secondary | ICD-10-CM

## 2011-10-18 DIAGNOSIS — I739 Peripheral vascular disease, unspecified: Secondary | ICD-10-CM

## 2011-10-21 ENCOUNTER — Encounter: Payer: Self-pay | Admitting: Internal Medicine

## 2011-10-21 ENCOUNTER — Ambulatory Visit (INDEPENDENT_AMBULATORY_CARE_PROVIDER_SITE_OTHER): Payer: Medicare Other | Admitting: Internal Medicine

## 2011-10-21 VITALS — BP 154/60 | HR 72 | Temp 98.5°F | Wt 106.0 lb

## 2011-10-21 DIAGNOSIS — Z23 Encounter for immunization: Secondary | ICD-10-CM

## 2011-10-21 DIAGNOSIS — J4489 Other specified chronic obstructive pulmonary disease: Secondary | ICD-10-CM

## 2011-10-21 DIAGNOSIS — I2510 Atherosclerotic heart disease of native coronary artery without angina pectoris: Secondary | ICD-10-CM

## 2011-10-21 DIAGNOSIS — J449 Chronic obstructive pulmonary disease, unspecified: Secondary | ICD-10-CM

## 2011-10-21 DIAGNOSIS — T887XXA Unspecified adverse effect of drug or medicament, initial encounter: Secondary | ICD-10-CM

## 2011-10-21 DIAGNOSIS — C4490 Unspecified malignant neoplasm of skin, unspecified: Secondary | ICD-10-CM

## 2011-10-21 DIAGNOSIS — M129 Arthropathy, unspecified: Secondary | ICD-10-CM

## 2011-10-21 DIAGNOSIS — M1990 Unspecified osteoarthritis, unspecified site: Secondary | ICD-10-CM

## 2011-10-21 DIAGNOSIS — I10 Essential (primary) hypertension: Secondary | ICD-10-CM

## 2011-10-21 NOTE — Progress Notes (Signed)
Subjective:    Patient ID: [name] [name], female    DOB: 08/24/1927, 78 y.o.   MRN: [id number]  HPI Patient comes in today for follow up of  multiple medical problems.  Has seen Dr Jens Som and no change in vascular compromise  Pravastatin  Caused liver issues  And body aches    Summer .  ocass tobacco.  Breathing "ok. "  BP up and down  Had skin cancer removed from face healing also had a eye nfection now better  Cost limits getting to all the docs  Review of Systems No cp  Denies sig SOb does have leg cramps and claudication. No syncope no fever  No bleeding  Past history family history social history reviewed in the electronic medical record.  Outpatient Encounter Prescriptions as of 10/21/2011  Medication Sig Dispense Refill  . amLODipine (NORVASC) 5 MG tablet Take 1 tablet (5 mg total) by mouth daily.  90 tablet  3  . aspirin 81 MG EC tablet Take 81 mg by mouth daily.        . Cholecalciferol (VITAMIN D) 1000 UNITS capsule Take 1,000 Units by mouth daily.        . fish oil-omega-3 fatty acids 1000 MG capsule Take 2 g by mouth daily.        Marland Kitchen guaiFENesin (MUCINEX) 600 MG 12 hr tablet Take 1,200 mg by mouth 1 day or 1 dose.      . isosorbide mononitrate (IMDUR) 30 MG 24 hr tablet Take 1 tablet (30 mg total) by mouth daily.  90 tablet  3  . levalbuterol (XOPENEX HFA) 45 MCG/ACT inhaler Inhale 2 puffs into the lungs every 6 (six) hours as needed.  1 Inhaler  2  . nitroGLYCERIN (NITROSTAT) 0.4 MG SL tablet Place 1 tablet (0.4 mg total) under the tongue every 5 (five) minutes as needed.  25 tablet  3  . tiotropium (SPIRIVA HANDIHALER) 18 MCG inhalation capsule Place 1 capsule (18 mcg total) into inhaler and inhale daily.  30 capsule  11  . traMADol (ULTRAM) 50 MG tablet Take 1 tablet (50 mg total) by mouth every 8 (eight) hours as needed for pain.  40 tablet  0  . DISCONTD: Mometasone Furo-Formoterol Fum 200-5 MCG/ACT AERO Inhale 2 puffs into the lungs daily.        Past history  family history social history reviewed in the electronic medical record.      Objective:   Physical Exam BP 154/60  Pulse 72  Temp 98.5 F (36.9 C) (Oral)  Wt 106 lb (48.081 kg) Wt Readings from Last 3 Encounters:  10/21/11 106 lb (48.081 kg)  10/14/11 105 lb 12.8 oz (47.991 kg)  07/19/11 103 lb (46.72 kg)   WD slender  In nad    resp quiet  Diffuse rhoncio no wheezing  Pulse ox 98  heent at  Tongue midline  Neck no masses or jvd  Chest dec bs and few wheezes noted   Cor rr no g or m  No clubbing cyanosis or edema Skin healing area on face.     Assessment & Plan:   Copd  Pt denies sig problem but exam abnormal and limits acitivoty prob cuase of vascular disease  She says rarely has a cigarette at all  Sample of dulera.   Hx skin cancer on leg removed  Face  Ht    Better on repeat in control PVD on going  Se of statins with abn lfts and myalgias joint  pains  hasn't gotten rheum eval at this time still plan to see rheum has pos anticcp aby Underweight  No change  Counseled. Total visit > 50% spent counseling and coordinating care   rov in 6 months  Has many specialty visits

## 2011-10-21 NOTE — Patient Instructions (Addendum)
Continue to stop tobacco and avoid  Tobacco smoke of others.  rov in  6 months  Want you to  Follow up with  Dr Vassie Loll  .  Try adding the dulera   1-2 puffs twice a day.In the meantime    And also the rheumatologist.

## 2011-10-24 DIAGNOSIS — T887XXA Unspecified adverse effect of drug or medicament, initial encounter: Secondary | ICD-10-CM | POA: Insufficient documentation

## 2011-11-01 ENCOUNTER — Ambulatory Visit (INDEPENDENT_AMBULATORY_CARE_PROVIDER_SITE_OTHER): Payer: Medicare Other | Admitting: Adult Health

## 2011-11-01 ENCOUNTER — Encounter: Payer: Self-pay | Admitting: Adult Health

## 2011-11-01 VITALS — BP 144/76 | HR 65 | Temp 97.2°F | Ht 64.0 in | Wt 107.2 lb

## 2011-11-01 DIAGNOSIS — J4489 Other specified chronic obstructive pulmonary disease: Secondary | ICD-10-CM

## 2011-11-01 DIAGNOSIS — J449 Chronic obstructive pulmonary disease, unspecified: Secondary | ICD-10-CM

## 2011-11-01 NOTE — Patient Instructions (Addendum)
Continue on Spiriva daily  follow up Dr. Vassie Loll  In 4 months and As needed

## 2011-11-01 NOTE — Assessment & Plan Note (Signed)
Compensated on present regimen.  Cont on Spiriva daily  Flutter and mucinex As needed

## 2011-11-01 NOTE — Progress Notes (Signed)
Subjective:    Patient ID: [name] [name], female    DOB: 1927/11/05, 78 y.o.   MRN: [id number]  HPI   PCP - [name]  83/F,ex - smoker, with CAD, PVD, gold stg 2 COPD & recurrent LLL atelectasis 6/12 >>Spirometry >>FEV1 61%  She quit smoking in 5/12   She was first admitted 6/12 for hypoxia & LLL atelectasis.  Ct chest showed Volume loss in the inferior left hemithorax. Concurrent filling of the left lower and less so left upper lobe endobronchial tree.  Bronchoscopy showed LLL endobronchial narrowing, without discrete mass. Papule on rt mainstem bronchus was brushed - cytology all neg. BAL cx, afb, fungal neg  Re- admitted 8/19-8/23/12 for pneumonia & LLL atx.  Rpt bscopy >>thick mucoid secretions with difficulty passing scope. Cytology was neg for malignancy . Path showed AMYLOID DEPOSITION AND DYSTROPHIC CALCIFICATIONS. CT sinuses 8/12 neg  Rpt Ct chest 9/12 -improved aeration LLL, calcified bronchi, no endobronchial lesions , 1.8 cm subcarinal LN - NEW compared to 8/12  11/05/2010 Acute OV . >>rx Augmentin x 10d     5/14 /2013 Patient presents to discuss CT results.>>No residual lymphadenopathy. No evidence of metastatic breast cancer.  Stable chronic lung disease with biapical scarring, diffuse  calcifications of the tracheobronchial tree and mucous impaction of  the lingular bronchus Dulera  caused her mouth irritation & shakes She seems to have self stopped Asmanex   c/o chest congestion and left shoulder blade pain.  >no changes   11/01/2011 Follow up for COPD  Patient returns for a four-month followup for COPD. She takes Spiriva most days  She is tolerating well without any noted difficulties. No recent hospitalizations or emergency room visits. Flu  and Pneumovax Shots up-to-date She denies chest pain, orthopnea, PND, or leg swelling She does use flutter valve and Mucinex, several times a week   Review of Systems Constitutional:   No  weight loss, night  sweats,  Fevers, chills, fatigue, or  lassitude.  HEENT:   No headaches,  Difficulty swallowing,  Tooth/dental problems, or  Sore throat,                No sneezing, itching, ear ache, nasal congestion, post nasal drip,   CV:  No chest pain,  Orthopnea, PND, swelling in lower extremities, anasarca, dizziness, palpitations, syncope.   GI  No heartburn, indigestion, abdominal pain, nausea, vomiting, diarrhea, change in bowel habits, loss of appetite, bloody stools.   Resp  No excess mucus, no productive cough,  No non-productive cough,  No coughing up of blood.  No change in color of mucus.  No wheezing.  No chest wall deformity  Skin: no rash or lesions.  GU: no dysuria, change in color of urine, no urgency or frequency.  No flank pain, no hematuria   MS:  No joint pain or swelling.  No decreased range of motion.    Psych:  No change in mood or affect. No depression or anxiety.  No memory loss.         Objective:   Physical Exam   Gen. Pleasant, thin woman, in no distress ENT - no lesions, no post nasal drip Neck: No JVD, no thyromegaly, no carotid bruits Lungs: no use of accessory muscles, no dullness to percussion, decreased without rales or rhonchi  Cardiovascular: Rhythm regular, heart sounds  normal, no murmurs or gallops, no peripheral edema Musculoskeletal: No deformities, no cyanosis or clubbing        Assessment & Plan:

## 2011-12-20 ENCOUNTER — Other Ambulatory Visit: Payer: Self-pay | Admitting: Internal Medicine

## 2012-01-05 ENCOUNTER — Ambulatory Visit (INDEPENDENT_AMBULATORY_CARE_PROVIDER_SITE_OTHER)
Admission: RE | Admit: 2012-01-05 | Discharge: 2012-01-05 | Disposition: A | Discharge status: Home / Self Care | Payer: Medicare Other | Source: Ambulatory Visit | Attending: Pulmonary Disease | Admitting: Pulmonary Disease

## 2012-01-05 ENCOUNTER — Encounter: Payer: Self-pay | Admitting: Pulmonary Disease

## 2012-01-05 ENCOUNTER — Ambulatory Visit (INDEPENDENT_AMBULATORY_CARE_PROVIDER_SITE_OTHER): Payer: Medicare Other | Admitting: Pulmonary Disease

## 2012-01-05 VITALS — BP 138/62 | HR 66 | Temp 98.2°F | Ht 64.0 in | Wt 107.2 lb

## 2012-01-05 DIAGNOSIS — J449 Chronic obstructive pulmonary disease, unspecified: Secondary | ICD-10-CM

## 2012-01-05 DIAGNOSIS — J9811 Atelectasis: Secondary | ICD-10-CM

## 2012-01-05 DIAGNOSIS — J9819 Other pulmonary collapse: Secondary | ICD-10-CM

## 2012-01-05 MED ORDER — AMOXICILLIN-POT CLAVULANATE 875-125 MG PO TABS: 1.0000 | ORAL_TABLET | Freq: Two times a day (BID) | ORAL | Status: DC

## 2012-01-05 MED ORDER — LEVOFLOXACIN 500 MG PO TABS: 500.0000 mg | ORAL_TABLET | Freq: Every day | ORAL | Status: DC

## 2012-01-05 MED ORDER — TIOTROPIUM BROMIDE MONOHYDRATE 18 MCG IN CAPS: 18.0000 ug | ORAL_CAPSULE | Freq: Every day | RESPIRATORY_TRACT | Status: DC

## 2012-01-05 MED ORDER — PREDNISONE 10 MG PO TABS: ORAL_TABLET | ORAL | Status: DC

## 2012-01-05 NOTE — Progress Notes (Signed)
Subjective:    Patient ID: [name] [name], female    DOB: [birth year], 78 y.o.   MRN: [id number]  HPI PCP - [name]   84/F,ex - smoker, with CAD, PVD, gold stg 2 COPD & recurrent LLL atelectasis  6/12 >>Spirometry >>FEV1 61%  She quit smoking in 5/12  She was first admitted 6/12 for hypoxia & LLL atelectasis.  Ct chest showed Volume loss in the inferior left hemithorax. Concurrent filling of the left lower and less so left upper lobe endobronchial tree.  Bronchoscopy showed LLL endobronchial narrowing, without discrete mass. Papule on rt mainstem bronchus was brushed - cytology all neg. BAL cx, afb, fungal neg  Re- admitted 8/19-8/23/12 for pneumonia & LLL atx. Rpt bscopy >>thick mucoid secretions with difficulty passing scope. Cytology was neg for malignancy . Path showed AMYLOID DEPOSITION AND DYSTROPHIC CALCIFICATIONS.  CT sinuses 8/12 neg  Rpt Ct chest 9/12 -improved aeration LLL, calcified bronchi, no endobronchial lesions , 1.8 cm subcarinal LN - NEW compared to 8/12  11/05/2010 Acute OV . >>rx Augmentin x 10d   CT 5/13.>>No residual lymphadenopathy. No evidence of metastatic breast cancer. 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 POSTHERPETIC NEURALGIA 08/25/2006  . CARCINOMA, BASAL CELL 02/13/2006  . NEOPLASM, SKIN, UNCERTAIN BEHAVIOR 12/16/2008  . HYPOTHYROIDISM 01/30/2007  . Unspecified vitamin D deficiency 02/07/2007  . HYPERLIPIDEMIA 03/10/2008  . UNSPECIFIED ANEMIA  03/18/2008  . THROMBOCYTOPENIA 07/30/2008  . LOSS, HEARING NOS 08/25/2006  . HYPERTENSION 02/13/2006  . UNSTABLE ANGINA 12/12/2009  . CORONARY ARTERY DISEASE 02/13/2006  . CAROTID ARTERY DISEASE 10/03/2006  . RENAL ARTERY STENOSIS 08/01/2008  . PERIPHERAL VASCULAR DISEASE 02/13/2006  . Chronic rhinitis 12/13/2006  . CHRONIC OBSTRUCTIVE PULMONARY DISEASE, ACUTE EXACERBATION 11/17/2009  . DERMATITIS 09/25/2007  . DEGENERATIVE JOINT DISEASE 09/27/2006  . OSTEOARTHRITIS, HAND 12/16/2008  . SPINAL STENOSIS 11/01/2006  . LIVER FUNCTION TESTS, ABNORMAL 01/22/2009  . UNS ADVRS EFF UNS RX MEDICINAL&BIOLOGICAL SBSTNC 01/30/2007  . Personal history of malignant neoplasm of breast 07/24/2008  . NEPHROLITHIASIS, HX OF 02/27/2010  . Vitreous detachment   . Shingles     recurrent  . Head trauma     closed  . Personal history of urinary calculi     Review of Systems neg for any significant sore throat, dysphagia, itching, sneezing, nasal congestion or excess/ purulent secretions, fever, chills, sweats, unintended wt loss, pleuritic or exertional cp, hempoptysis, orthopnea pnd or change in chronic leg swelling. Also denies presyncope, palpitations, heartburn, abdominal pain, nausea, vomiting, diarrhea or change in bowel or urinary habits, dysuria,hematuria, rash, arthralgias, visual complaints, headache, numbness weakness or ataxia.     Objective:   Physical Exam  Gen. Pleasant, well-nourished, in no distress, normal affect ENT - no lesions, no post nasal drip Neck: No JVD, no thyromegaly, no carotid bruits Lungs: no use of accessory muscles, no dullness to percussion, no rales , bL diffuse rhonchi  Cardiovascular: Rhythm regular, heart sounds  normal, no murmurs or gallops,  no peripheral edema Abdomen: soft and non-tender, no hepatosplenomegaly, BS normal. Musculoskeletal: No deformities, no cyanosis or clubbing Neuro:  alert, non focal       Assessment & Plan:

## 2012-01-05 NOTE — Assessment & Plan Note (Signed)
cxr - no recurrence

## 2012-01-05 NOTE — Assessment & Plan Note (Addendum)
augmentin 875 x 7 ds Prednisone 10 mg Take 4 tabs  daily with food x 4 days, then 3 tabs daily x 4 days, then 2 tabs daily x 4 days, then 1 tab daily x4 days then stop. #40 Chest xray today Mucinex 600 tabs twice daily

## 2012-01-05 NOTE — Patient Instructions (Addendum)
Augmentin 875 twice daily  x 7 ds Prednisone 10 mg Take 4 tabs  daily with food x 4 days, then 3 tabs daily x 4 days, then 2 tabs daily x 4 days, then 1 tab daily x4 days then stop. #40 Chest xray today Mucinex 600 tabs twice daily Spiriva sample

## 2012-01-11 ENCOUNTER — Telehealth: Payer: Self-pay | Admitting: Internal Medicine

## 2012-01-11 NOTE — Telephone Encounter (Signed)
Forward  5 pages from [employer] to Dr. Berniece Andreas for review on 01-11-12 ym

## 2012-01-12 ENCOUNTER — Ambulatory Visit (INDEPENDENT_AMBULATORY_CARE_PROVIDER_SITE_OTHER): Payer: Medicare Other | Admitting: Adult Health

## 2012-01-12 ENCOUNTER — Encounter: Payer: Self-pay | Admitting: Adult Health

## 2012-01-12 VITALS — BP 130/88 | HR 79 | Temp 97.5°F | Ht 64.0 in | Wt 109.2 lb

## 2012-01-12 DIAGNOSIS — J449 Chronic obstructive pulmonary disease, unspecified: Secondary | ICD-10-CM

## 2012-01-12 NOTE — Progress Notes (Signed)
Subjective:    Patient ID: [name] [name], female    DOB: October 01, 1927, 78 y.o.   MRN: [id number]  HPI  PCP - [name]   84/F,ex - smoker, with CAD, PVD, gold stg 2 COPD & recurrent LLL atelectasis  6/12 >>Spirometry >>FEV1 61%  She quit smoking in 5/12  She was first admitted 6/12 for hypoxia & LLL atelectasis.  Ct chest showed Volume loss in the inferior left hemithorax. Concurrent filling of the left lower and less so left upper lobe endobronchial tree.  Bronchoscopy showed LLL endobronchial narrowing, without discrete mass. Papule on rt mainstem bronchus was brushed - cytology all neg. BAL cx, afb, fungal neg  Re- admitted 8/19-8/23/12 for pneumonia & LLL atx. Rpt bscopy >>thick mucoid secretions with difficulty passing scope. Cytology was neg for malignancy . Path showed AMYLOID DEPOSITION AND DYSTROPHIC CALCIFICATIONS.  CT sinuses 8/12 neg  Rpt Ct chest 9/12 -improved aeration LLL, calcified bronchi, no endobronchial lesions , 1.8 cm subcarinal LN - NEW compared to 8/12  11/05/2010 Acute OV . >>rx Augmentin x 10d   CT 5/13.>>No residual lymphadenopathy. No evidence of metastatic breast cancer. 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  She is feeling better but still has congested cough with thick mucus and sinus  pressure.  No fever , hemoptysis or edema.  Finished Augmentin yesterday . Has few days left on steroid.       Past Medical History  Diagnosis Date  . POSTHERPETIC NEURALGIA 08/25/2006  . CARCINOMA, BASAL CELL 02/13/2006  . NEOPLASM, SKIN, UNCERTAIN BEHAVIOR 12/16/2008  . HYPOTHYROIDISM 01/30/2007  . Unspecified vitamin D deficiency 02/07/2007  . HYPERLIPIDEMIA 03/10/2008  . UNSPECIFIED ANEMIA 03/18/2008  . THROMBOCYTOPENIA 07/30/2008  . LOSS, HEARING NOS 08/25/2006  . HYPERTENSION 02/13/2006  . UNSTABLE ANGINA 12/12/2009  . CORONARY ARTERY DISEASE 02/13/2006  . CAROTID ARTERY DISEASE 10/03/2006  . RENAL ARTERY STENOSIS 08/01/2008  . PERIPHERAL VASCULAR DISEASE 02/13/2006  . Chronic rhinitis 12/13/2006  . CHRONIC OBSTRUCTIVE PULMONARY DISEASE, ACUTE EXACERBATION 11/17/2009  . DERMATITIS 09/25/2007  . DEGENERATIVE JOINT DISEASE 09/27/2006  . OSTEOARTHRITIS, HAND 12/16/2008  . SPINAL STENOSIS 11/01/2006  . LIVER FUNCTION TESTS, ABNORMAL 01/22/2009  . UNS ADVRS EFF UNS RX MEDICINAL&BIOLOGICAL SBSTNC 01/30/2007  . Personal history of malignant neoplasm of breast 07/24/2008  . NEPHROLITHIASIS, HX OF 02/27/2010  . Vitreous detachment   . Shingles     recurrent  . Head trauma     closed  . Personal history of urinary calculi     Review of Systems Constitutional:   No  weight loss, night sweats,  Fevers, chills, + fatigue, or  lassitude.  HEENT:   No headaches,  Difficulty swallowing,  Tooth/dental problems, or  Sore throat,  No sneezing, itching, ear ache, + nasal congestion, post nasal drip,   CV:  No chest pain,  Orthopnea, PND, swelling in lower extremities, anasarca, dizziness, palpitations, syncope.   GI  No heartburn, indigestion, abdominal pain, nausea, vomiting, diarrhea, change in bowel habits, loss of appetite, bloody stools.   Resp:   No coughing up of blood.   No chest wall deformity  Skin: no rash or lesions.  GU: no dysuria, change in color of urine, no  urgency or frequency.  No flank pain, no hematuria   MS:  No joint pain or swelling.  No decreased range of motion.  No back pain.  Psych:  No change in mood or affect. No depression or anxiety.  No memory loss.          Objective:   Physical Exam   Gen. Pleasant, well-nourished, in no distress, normal affect ENT - no lesions, clear  post nasal drip Neck: No JVD, no thyromegaly, no carotid bruits Lungs: no use of accessory muscles, no dullness to percussion, no rales , bL diffuse rhonchi  Cardiovascular: Rhythm regular, heart sounds  normal, no murmurs or gallops, no peripheral edema Abdomen: soft and non-tender, no hepatosplenomegaly, BS normal. Musculoskeletal: No deformities, no cyanosis or clubbing Neuro:  alert, non focal       Assessment & Plan:

## 2012-01-12 NOTE — Patient Instructions (Signed)
Extend Augmentin twice daily for additional 7 days. Mucinex DM twice daily as needed. For cough and congestion.  Finish prednisone taper as directed. Followup with Dr. Vassie Loll in 6 weeks and as needed. Please contact office for sooner follow up if symptoms do not improve or worsen or seek emergency care

## 2012-01-12 NOTE — Assessment & Plan Note (Signed)
Slowly resolve COPD, exacerbation, with  associated sinusitis Xopenex nebulizer, given in the office  Plan Extend Augmentin twice daily for additional 7 days. Mucinex DM twice daily as needed. For cough and congestion.  Finish prednisone taper as directed. Followup with Dr. Vassie Loll in 6 weeks and as needed. Please contact office for sooner follow up if symptoms do not improve or worsen or seek emergency care

## 2012-01-13 MED ORDER — LEVALBUTEROL HCL 0.63 MG/3ML IN NEBU
0.6300 mg | INHALATION_SOLUTION | Freq: Once | RESPIRATORY_TRACT | Status: AC
  Administered 2012-01-13: 0.63 mg via RESPIRATORY_TRACT

## 2012-01-13 MED ORDER — TIOTROPIUM BROMIDE MONOHYDRATE 18 MCG IN CAPS: 18.0000 ug | ORAL_CAPSULE | Freq: Every day | RESPIRATORY_TRACT | Status: DC

## 2012-01-13 NOTE — Addendum Note (Signed)
Addended by: Boone Master E on: 01/13/2012 10:25 AM   Modules accepted: Orders

## 2012-01-25 ENCOUNTER — Telehealth: Payer: Self-pay | Admitting: Pulmonary Disease

## 2012-01-25 MED ORDER — CLOTRIMAZOLE 10 MG MT TROC: 10.0000 mg | Freq: Every day | OROMUCOSAL | Status: DC

## 2012-01-25 NOTE — Telephone Encounter (Signed)
Pt aware that rx has been sent in for her and to call us if she doesn't get any better

## 2012-01-25 NOTE — Telephone Encounter (Signed)
Mycelex troch fives times daily x 7 days , rx sent  Please contact office for sooner follow up if symptoms do not improve or worsen or seek emergency care

## 2012-01-25 NOTE — Telephone Encounter (Signed)
Last OV 01/12/12. I spoke with the pt and she states she has developed thrush after taking augmentin and prednisone that was prescribed on 01/12/12. Pt c/o white patches in her mouth and throat. Pt states she has used nystatin mouth wash in the past and this has helped. Pt requesting an rx for this. Please advise. Carron Curie, CMA Allergies  Allergen Reactions  . Oxycodone   . Statins   . Tequin

## 2012-02-18 ENCOUNTER — Other Ambulatory Visit: Payer: Self-pay | Admitting: Cardiology

## 2012-03-02 ENCOUNTER — Encounter: Payer: Self-pay | Admitting: Pulmonary Disease

## 2012-03-02 ENCOUNTER — Ambulatory Visit (INDEPENDENT_AMBULATORY_CARE_PROVIDER_SITE_OTHER): Payer: Medicare Other | Admitting: Pulmonary Disease

## 2012-03-02 VITALS — BP 132/68 | HR 64 | Temp 98.4°F | Ht 64.0 in | Wt 114.0 lb

## 2012-03-02 DIAGNOSIS — J449 Chronic obstructive pulmonary disease, unspecified: Secondary | ICD-10-CM

## 2012-03-02 NOTE — Assessment & Plan Note (Signed)
FEV1 61% 6/12, quit smoking 5/12 Lymphadenopathy resolved on CT ?amyloid on bronchial biopsy  Stay on spiriva Pulm rehab - she prefers to hold off Annual CXR

## 2012-03-02 NOTE — Progress Notes (Signed)
  Subjective:    Patient ID: [name] [name], female    DOB: [birth year], 79 y.o.   MRN: [id number]  HPI PCP - [name]   84/F,ex - smoker, with CAD, PVD, gold stg 2 COPD & recurrent LLL atelectasis  6/12 >>Spirometry >>FEV1 61%  She quit smoking in 5/12  She was first admitted 6/12 for hypoxia & LLL atelectasis.  Ct chest showed Volume loss in the inferior left hemithorax with filling defect of the left lower and less so left upper lobe endobronchial tree.  Bronchoscopy showed LLL endobronchial narrowing, without discrete mass. Papule on rt mainstem bronchus was brushed - cytology all neg. BAL cx, afb, fungal neg  Re- admitted 8/19-8/23/12 for pneumonia & LLL atx. Rpt bscopy >>thick mucoid secretions with difficulty passing scope. Cytology was neg for malignancy . Path showed AMYLOID DEPOSITION AND DYSTROPHIC CALCIFICATIONS.  CT sinuses 8/12 neg  Rpt Ct chest 9/12 -improved aeration LLL, calcified bronchi, no endobronchial lesions , 1.8 cm subcarinal LN - NEW compared to 8/12  11/05/2010 Acute OV . >>rx Augmentin x 10d  CT 5/13.>>No residual lymphadenopathy. No evidence of metastatic breast cancer. Stable chronic lung disease with biapical scarring, diffuse calcifications of the tracheobronchial tree and mucous impaction of the lingular bronchus  Dulera caused her mouth irritation & shakes     03/02/2012 Flare in dec '13 - >>augmentin and steroid taper  breathing is better. Cough is better. no wheezing. Pt stated she swallowed wrong this morning and now she has a hurt in her chest.  She lives alone & is very functional   Review of Systems neg for any significant sore throat, dysphagia, itching, sneezing, nasal congestion or excess/ purulent secretions, fever, chills, sweats, unintended wt loss, pleuritic or exertional cp, hempoptysis, orthopnea pnd or change in chronic leg swelling. Also denies presyncope, palpitations, heartburn, abdominal pain, nausea, vomiting, diarrhea or change in  bowel or urinary habits, dysuria,hematuria, rash, arthralgias, visual complaints, headache, numbness weakness or ataxia.     Objective:   Physical Exam  Gen. Pleasant, well-nourished, in no distress ENT - no lesions, no post nasal drip Neck: No JVD, no thyromegaly, no carotid bruits Lungs: no use of accessory muscles, no dullness to percussion, clear without rales or rhonchi  Cardiovascular: Rhythm regular, heart sounds  normal, no murmurs or gallops, no peripheral edema Musculoskeletal: No deformities, no cyanosis or clubbing         Assessment & Plan:

## 2012-03-02 NOTE — Patient Instructions (Signed)
Stay on spiriva You will benefit from an exercise program at the hospital

## 2012-04-13 ENCOUNTER — Encounter: Payer: Self-pay | Admitting: Cardiovascular Disease

## 2012-04-13 ENCOUNTER — Ambulatory Visit (INDEPENDENT_AMBULATORY_CARE_PROVIDER_SITE_OTHER): Payer: Medicare Other | Admitting: Cardiovascular Disease

## 2012-04-13 VITALS — BP 150/66 | HR 65 | Ht 64.0 in | Wt 114.0 lb

## 2012-04-13 DIAGNOSIS — I739 Peripheral vascular disease, unspecified: Secondary | ICD-10-CM

## 2012-04-13 NOTE — Patient Instructions (Addendum)
Your physician wants you to follow-up in: 6 months.  You will receive a reminder letter in the mail two months in advance. If you don't receive a letter, please call our office to schedule the follow-up appointment.  Your physician has requested that you have an ankle brachial index (ABI). During this test an ultrasound and blood pressure cuff are used to evaluate the arteries that supply the arms and legs with blood. Allow thirty minutes for this exam. There are no restrictions or special instructions.   Your physician has requested that you have an  aorto-iliac duplex. During this test, an ultrasound is used to evaluate the aorta. Allow 30 minutes for this exam. Do not eat after midnight the day before and avoid carbonated beverages

## 2012-04-13 NOTE — Progress Notes (Signed)
HPI:  79 year old woman presenting for followup evaluation. She is followed for lower extremity peripheral arterial disease. The patient also has coronary artery disease with history of right coronary artery stenting in 2005. She is followed by Dr. Jens Som for cardiac issues.   Last ABIs were performed in September 2013 when she had a significant decrease on the right with an ABI of 0.5. A duplex ultrasound was performed and demonstrated severe bilateral common femoral artery stenoses. We have elected to treat her medically and she returns for followup today.  She continues to have problems with her legs. She has some difficulty getting around and has had low back problems. She has numbness in her left leg. This is not consistently related to walking. It seems to be positional and also occurs with standing. She does have bilateral calf discomfort with ambulation but this has not been a problem recently because of the bad weather and limited walking that she has done. She denies wound or ulceration of the feet. She has not had nocturnal pain. She denies chest pain or dyspnea. She still smokes occasionally but less than one cigarette per day.  She complains of coldness of the left arm, especially when she wakes up in the morning. She uses her left hand normally and has no weakness of that arm. She has no arm pain with movement.  Outpatient Encounter Prescriptions as of 04/13/2012  Medication Sig Dispense Refill  . amLODipine (NORVASC) 5 MG tablet TAKE ONE TABLET BY MOUTH ONE TIME DAILY  90 tablet  2  . aspirin 81 MG EC tablet Take 81 mg by mouth daily.        . Cholecalciferol (VITAMIN D) 1000 UNITS capsule Take 1,000 Units by mouth daily.        Marland Kitchen dextromethorphan (DELSYM) 30 MG/5ML liquid Take 60 mg by mouth as needed.      . fish oil-omega-3 fatty acids 1000 MG capsule Take 1 g by mouth daily.       Marland Kitchen guaiFENesin (MUCINEX) 600 MG 12 hr tablet Take 1,200 mg by mouth as needed.       . isosorbide  mononitrate (IMDUR) 30 MG 24 hr tablet Take 1 tablet (30 mg total) by mouth daily.  90 tablet  3  . levalbuterol (XOPENEX HFA) 45 MCG/ACT inhaler Inhale 2 puffs into the lungs every 6 (six) hours as needed.  1 Inhaler  2  . nitroGLYCERIN (NITROSTAT) 0.4 MG SL tablet Place 1 tablet (0.4 mg total) under the tongue every 5 (five) minutes as needed.  25 tablet  3  . tiotropium (SPIRIVA) 18 MCG inhalation capsule Place 18 mcg into inhaler and inhale every other day.      . traMADol (ULTRAM) 50 MG tablet Take 1 tablet (50 mg total) by mouth every 8 (eight) hours as needed for pain.  40 tablet  0   No facility-administered encounter medications on file as of 04/13/2012.    Allergies  Allergen Reactions  . Oxycodone   . Statins   . Tequin     Past Medical History  Diagnosis Date  . POSTHERPETIC NEURALGIA 08/25/2006  . CARCINOMA, BASAL CELL 02/13/2006  . NEOPLASM, SKIN, UNCERTAIN BEHAVIOR 12/16/2008  . HYPOTHYROIDISM 01/30/2007  . Unspecified vitamin D deficiency 02/07/2007  . HYPERLIPIDEMIA 03/10/2008  . UNSPECIFIED ANEMIA 03/18/2008  . THROMBOCYTOPENIA 07/30/2008  . LOSS, HEARING NOS 08/25/2006  . HYPERTENSION 02/13/2006  . UNSTABLE ANGINA 12/12/2009  . CORONARY ARTERY DISEASE 02/13/2006  . CAROTID ARTERY DISEASE 10/03/2006  .  RENAL ARTERY STENOSIS 08/01/2008  . PERIPHERAL VASCULAR DISEASE 02/13/2006  . Chronic rhinitis 12/13/2006  . CHRONIC OBSTRUCTIVE PULMONARY DISEASE, ACUTE EXACERBATION 11/17/2009  . DERMATITIS 09/25/2007  . DEGENERATIVE JOINT DISEASE 09/27/2006  . OSTEOARTHRITIS, HAND 12/16/2008  . SPINAL STENOSIS 11/01/2006  . LIVER FUNCTION TESTS, ABNORMAL 01/22/2009  . UNS ADVRS EFF UNS RX MEDICINAL&BIOLOGICAL SBSTNC 01/30/2007  . Personal history of malignant neoplasm of breast 07/24/2008  . NEPHROLITHIASIS, HX OF 02/27/2010  . Vitreous detachment   . Shingles     recurrent  . Head trauma     closed  . Personal history of urinary calculi    BP 150/66  Pulse 65  Ht 5\' 4"  (1.626 m)  Wt  51.71 kg (114 lb)  BMI 19.56 kg/m2  PHYSICAL EXAM: Pt is alert and oriented, pleasant elderly woman in NAD HEENT: normal Neck: JVP - normal, carotids 2+= with a right carotid bruit Lungs: CTA bilaterally CV: RRR with a soft systolic ejection murmur at the left sternal border Abd: soft, NT, Positive BS, no hepatomegaly Ext: Femoral pulses are 1+ bilaterally, DP and PT pulses are nonpalpable bilaterally Skin: Mild stasis dermatitis is noted.  EKG:  Normal sinus rhythm 65 beats per minute, septal infarct age undetermined, left axis deviation, consider pulmonary disease pattern.  ASSESSMENT AND PLAN: Lower extremity peripheral arterial disease. Overall the patient appears stable. I think her leg pain is multifactorial. I have recommended repeat ABIs and Doppler studies. As long as these are stable I would not recommend pursuing any revascularization therapy. The patient is very thin and I think with her frailty she would be a high risk of perioperative problems and she required vascular surgery. Because she has bilateral common femoral artery disease, she is not a very good candidate for endovascular treatment. Recommend continued medical management at the present time. I will see her back in 6 months. We will have to consider a more aggressive approach to revascularization if she develops progressive or resting limb ischemic symptoms. Complete tobacco cessation was again discussed with her today.  Tonny Bollman 04/13/2012 9:37 AM

## 2012-04-21 ENCOUNTER — Encounter: Payer: Self-pay | Admitting: Internal Medicine

## 2012-04-21 ENCOUNTER — Ambulatory Visit (INDEPENDENT_AMBULATORY_CARE_PROVIDER_SITE_OTHER): Payer: Medicare Other | Admitting: Internal Medicine

## 2012-04-21 VITALS — BP 150/60 | HR 66 | Temp 98.0°F | Wt 112.0 lb

## 2012-04-21 DIAGNOSIS — M1990 Unspecified osteoarthritis, unspecified site: Secondary | ICD-10-CM

## 2012-04-21 DIAGNOSIS — E785 Hyperlipidemia, unspecified: Secondary | ICD-10-CM

## 2012-04-21 DIAGNOSIS — I2510 Atherosclerotic heart disease of native coronary artery without angina pectoris: Secondary | ICD-10-CM

## 2012-04-21 DIAGNOSIS — I10 Essential (primary) hypertension: Secondary | ICD-10-CM

## 2012-04-21 DIAGNOSIS — F17200 Nicotine dependence, unspecified, uncomplicated: Secondary | ICD-10-CM | POA: Insufficient documentation

## 2012-04-21 DIAGNOSIS — E559 Vitamin D deficiency, unspecified: Secondary | ICD-10-CM

## 2012-04-21 DIAGNOSIS — J449 Chronic obstructive pulmonary disease, unspecified: Secondary | ICD-10-CM

## 2012-04-21 DIAGNOSIS — I739 Peripheral vascular disease, unspecified: Secondary | ICD-10-CM

## 2012-04-21 DIAGNOSIS — D696 Thrombocytopenia, unspecified: Secondary | ICD-10-CM

## 2012-04-21 LAB — CBC WITH DIFFERENTIAL/PLATELET
Basophils Absolute: 0 10*3/uL (ref 0.0–0.1)
Eosinophils Absolute: 0.1 10*3/uL (ref 0.0–0.7)
Eosinophils Relative: 1.6 % (ref 0.0–5.0)
HCT: 42.2 % (ref 36.0–46.0)
Hemoglobin: 14.1 g/dL (ref 12.0–15.0)
Monocytes Absolute: 0.5 10*3/uL (ref 0.1–1.0)
Monocytes Relative: 7.8 % (ref 3.0–12.0)
Neutro Abs: 4.4 10*3/uL (ref 1.4–7.7)
WBC: 6.2 10*3/uL (ref 4.5–10.5)

## 2012-04-21 LAB — HEPATIC FUNCTION PANEL
Alkaline Phosphatase: 99 U/L (ref 39–117)
Bilirubin, Direct: 0.1 mg/dL (ref 0.0–0.3)
Total Bilirubin: 0.8 mg/dL (ref 0.3–1.2)

## 2012-04-21 LAB — BASIC METABOLIC PANEL
GFR: 68.92 mL/min (ref >60.00)
Glucose, Bld: 96 mg/dL (ref 70–99)
Potassium: 3.3 mEq/L — ABNORMAL LOW (ref 3.5–5.1)
Sodium: 141 mEq/L (ref 135–145)

## 2012-04-21 NOTE — Progress Notes (Signed)
Chief Complaint  Patient presents with  . Follow-up    Multiple conditions    HPI: Patient comes in today for follow up of  multiple medical problems.    Sees  Card pvd  NO CHANGE    Leg pains. Supposed to have ABI another vascular assessment is worried about her legs but does not feel she could tolerate surgery Copd ? Amyloid on bx exhibit her lungs are about the same. She still uses a cigarette about once a month according to her Medications no current change is on Imdur and amlodipine. Rarely takes tramadol mostly in the summer when she works in the yard.  Lives by herself trying to manage home repairs frustrating for her.  No recent falling bleeding has had some headaches. No missed blood pressure medications ROS: See pertinent positives and negatives per HPI. Denies specific depression but does feel stressed at times too much to do.  Past Medical History  Diagnosis Date  . POSTHERPETIC NEURALGIA 08/25/2006  . CARCINOMA, BASAL CELL 02/13/2006  . NEOPLASM, SKIN, UNCERTAIN BEHAVIOR 12/16/2008  . HYPOTHYROIDISM 01/30/2007  . Unspecified vitamin D deficiency 02/07/2007  . HYPERLIPIDEMIA 03/10/2008  . UNSPECIFIED ANEMIA 03/18/2008  . THROMBOCYTOPENIA 07/30/2008  . LOSS, HEARING NOS 08/25/2006  . HYPERTENSION 02/13/2006  . UNSTABLE ANGINA 12/12/2009  . CORONARY ARTERY DISEASE 02/13/2006  . CAROTID ARTERY DISEASE 10/03/2006  . RENAL ARTERY STENOSIS 08/01/2008  . PERIPHERAL VASCULAR DISEASE 02/13/2006  . Chronic rhinitis 12/13/2006  . CHRONIC OBSTRUCTIVE PULMONARY DISEASE, ACUTE EXACERBATION 11/17/2009  . DERMATITIS 09/25/2007  . DEGENERATIVE JOINT DISEASE 09/27/2006  . OSTEOARTHRITIS, HAND 12/16/2008  . SPINAL STENOSIS 11/01/2006  . LIVER FUNCTION TESTS, ABNORMAL 01/22/2009  . UNS ADVRS EFF UNS RX MEDICINAL&BIOLOGICAL SBSTNC 01/30/2007  . Personal history of malignant neoplasm of breast 07/24/2008  . NEPHROLITHIASIS, HX OF 02/27/2010  . Vitreous detachment   . Shingles     recurrent  . Head  trauma     closed  . Personal history of urinary calculi   . Post-traumatic wound infection 12/01/2010    Mild streaking superiorly and on one day of septra  Will give rocephin and add keflex  adn follow up     Family History  Problem Relation Age of Onset  . Breast cancer Daughter   . Tuberculosis Father     History   Social History  . Marital Status: Widowed    Spouse Name: N/A    Number of Children: N/A  . Years of Education: N/A   Social History Main Topics  . Smoking status: Former Smoker -- 1.00 packs/day for 30 years    Types: Cigarettes    Quit date: 06/15/2010  . Smokeless tobacco: Never Used  . Alcohol Use: Yes     Comment: socially  . Drug Use: No  . Sexually Active: None   Other Topics Concern  . None   Social History Narrative   Widowed   5 hours of sleep   Has friends checking on her   No Pets    Outpatient Encounter Prescriptions as of 04/21/2012  Medication Sig Dispense Refill  . amLODipine (NORVASC) 5 MG tablet TAKE ONE TABLET BY MOUTH ONE TIME DAILY  90 tablet  2  . aspirin 81 MG EC tablet Take 81 mg by mouth daily.        . Cholecalciferol (VITAMIN D) 1000 UNITS capsule Take 1,000 Units by mouth daily.        Marland Kitchen dextromethorphan (DELSYM) 30 MG/5ML liquid Take 60 mg  by mouth as needed.      . fish oil-omega-3 fatty acids 1000 MG capsule Take 1 g by mouth daily.       Marland Kitchen guaiFENesin (MUCINEX) 600 MG 12 hr tablet Take 1,200 mg by mouth as needed.       . isosorbide mononitrate (IMDUR) 30 MG 24 hr tablet Take 1 tablet (30 mg total) by mouth daily.  90 tablet  3  . levalbuterol (XOPENEX HFA) 45 MCG/ACT inhaler Inhale 2 puffs into the lungs every 6 (six) hours as needed.  1 Inhaler  2  . nitroGLYCERIN (NITROSTAT) 0.4 MG SL tablet Place 1 tablet (0.4 mg total) under the tongue every 5 (five) minutes as needed.  25 tablet  3  . tiotropium (SPIRIVA) 18 MCG inhalation capsule Place 18 mcg into inhaler and inhale every other day.      . traMADol (ULTRAM) 50  MG tablet Take 1 tablet (50 mg total) by mouth every 8 (eight) hours as needed for pain.  40 tablet  0   No facility-administered encounter medications on file as of 04/21/2012.    EXAM:  BP 150/60  Pulse 66  Temp(Src) 98 F (36.7 C) (Oral)  Wt 112 lb (50.803 kg)  BMI 19.22 kg/m2  SpO2 98%  Body mass index is 19.22 kg/(m^2).  GENERAL: vitals reviewed and listed above, alert, oriented, appears well hydrated and in no acute distress she is. Thin but alert and cognitively intact  HEENT: atraumatic, conjunctiva  clear, no obvious abnormalities on inspection of external nose and ears OP : no lesion edema or exudate   NECK: no obvious masses on inspection palpation no JVD seen  LUNGS: clear to auscultation bilaterally, no wheezes, rales or rhonchi,  decreased breath sounds no retractions  CV: HRRR, no clubbing cyanosis or  peripheral edema   MS: moves all extremities without noticeable focal  abnormality very slender gait somewhat antalgic. Skin sun and age changes no acute bruising or bleeding. PSYCH: pleasant and cooperative, no obvious depression or anxiety Lab Results  Component Value Date   WBC 6.2 04/21/2012   HGB 14.1 04/21/2012   HCT 42.2 04/21/2012   PLT 146.0* 04/21/2012   GLUCOSE 96 04/21/2012   CHOL 159 08/30/2011   TRIG 53.0 08/30/2011   HDL 65.00 08/30/2011   LDLCALC 83 08/30/2011   ALT 20 04/21/2012   AST 29 04/21/2012   NA 141 04/21/2012   K 3.3* 04/21/2012   CL 106 04/21/2012   CREATININE 0.8 04/21/2012   BUN 18 04/21/2012   CO2 27 04/21/2012   TSH 1.82 04/21/2012   INR 1.04 09/20/2010   HGBA1C 5.9 05/31/2011    ASSESSMENT AND PLAN:  Discussed the following assessment and plan:  HYPERTENSION - Plan: Basic metabolic panel, CBC with Differential, Hepatic function panel, TSH  COPD - Plan: Basic metabolic panel, CBC with Differential, Hepatic function panel, TSH  HYPERLIPIDEMIA - Plan: Basic metabolic panel, CBC with Differential, Hepatic function panel,  TSH  THROMBOCYTOPENIA - Plan: Basic metabolic panel, CBC with Differential, Hepatic function panel, TSH  CORONARY ARTERY DISEASE - Plan: Basic metabolic panel, CBC with Differential, Hepatic function panel, TSH  DEGENERATIVE JOINT DISEASE - Plan: Basic metabolic panel, CBC with Differential, Hepatic function panel, TSH  PERIPHERAL VASCULAR DISEASE  TOBACCO USE - in frequent but continues   counseld Blood pressures a little bit high today but I would be very hesitant to adjust her medicine up without further readings to avoid hypotension. Although She is due for monitoring  labs will get those today and contact her about results and followup otherwise 6 month followup spent a good deal of time encouraging to stop smoking altogether even though she states she does only a couple times a month. This is the only significant intervention I can think of to help with her peripheral artery disease. She's currently not on a statin medicine because she had abnormal liver tests. -Patient advised to return or notify health care team  if symptoms worsen or persist or new concerns arise.  Patient Instructions  Will notify you  of labs when available. No tobacco is good.  For your legs.   Blood pressure is a bit high  150 range   .  ROV in 6 months or as needed.     Neta Mends. Vietta Bonifield M.D.

## 2012-04-21 NOTE — Patient Instructions (Signed)
Will notify you  of labs when available. No tobacco is good.  For your legs.   Blood pressure is a bit high  150 range   .  ROV in 6 months or as needed.

## 2012-05-01 ENCOUNTER — Encounter (INDEPENDENT_AMBULATORY_CARE_PROVIDER_SITE_OTHER): Payer: Medicare Other

## 2012-05-01 DIAGNOSIS — I700 Atherosclerosis of aorta: Secondary | ICD-10-CM

## 2012-05-01 DIAGNOSIS — I739 Peripheral vascular disease, unspecified: Secondary | ICD-10-CM

## 2012-05-08 ENCOUNTER — Ambulatory Visit (INDEPENDENT_AMBULATORY_CARE_PROVIDER_SITE_OTHER): Payer: Medicare Other | Admitting: Adult Health

## 2012-05-08 ENCOUNTER — Encounter: Payer: Self-pay | Admitting: Adult Health

## 2012-05-08 VITALS — BP 124/66 | HR 82 | Temp 99.2°F | Ht 64.0 in | Wt 101.6 lb

## 2012-05-08 DIAGNOSIS — J4489 Other specified chronic obstructive pulmonary disease: Secondary | ICD-10-CM

## 2012-05-08 DIAGNOSIS — J449 Chronic obstructive pulmonary disease, unspecified: Secondary | ICD-10-CM

## 2012-05-08 MED ORDER — AMOXICILLIN-POT CLAVULANATE 875-125 MG PO TABS: 1.0000 | ORAL_TABLET | Freq: Two times a day (BID) | ORAL | Status: AC

## 2012-05-08 MED ORDER — LEVALBUTEROL HCL 0.63 MG/3ML IN NEBU
0.6300 mg | INHALATION_SOLUTION | Freq: Once | RESPIRATORY_TRACT | Status: AC
  Administered 2012-05-08: 0.63 mg via RESPIRATORY_TRACT

## 2012-05-08 MED ORDER — PREDNISONE 10 MG PO TABS: ORAL_TABLET | ORAL | Status: DC

## 2012-05-08 NOTE — Progress Notes (Signed)
  Subjective:    Patient ID: [name] [name], female    DOB: 11/21/1927, 79 y.o.   MRN: [id number]  HPI  PCP - [name]   84/F,ex - smoker, with CAD, PVD, gold stg 2 COPD & recurrent LLL atelectasis  6/12 >>Spirometry >>FEV1 61%  She quit smoking in 5/12  She was first admitted 6/12 for hypoxia & LLL atelectasis.  Ct chest showed Volume loss in the inferior left hemithorax with filling defect of the left lower and less so left upper lobe endobronchial tree.  Bronchoscopy showed LLL endobronchial narrowing, without discrete mass. Papule on rt mainstem bronchus was brushed - cytology all neg. BAL cx, afb, fungal neg  Re- admitted 8/19-8/23/12 for pneumonia & LLL atx. Rpt bscopy >>thick mucoid secretions with difficulty passing scope. Cytology was neg for malignancy . Path showed AMYLOID DEPOSITION AND DYSTROPHIC CALCIFICATIONS.  CT sinuses 8/12 neg  Rpt Ct chest 9/12 -improved aeration LLL, calcified bronchi, no endobronchial lesions , 1.8 cm subcarinal LN - NEW compared to 8/12  11/05/2010 Acute OV . >>rx Augmentin x 10d  CT 5/13.>>No residual lymphadenopathy. No evidence of metastatic breast cancer. Stable chronic lung disease with biapical scarring, diffuse calcifications of the tracheobronchial tree and mucous impaction of the lingular bronchus  Dulera caused her mouth irritation & shakes     03/02/12  Flare in dec '13 - >>augmentin and steroid taper  breathing is better. Cough is better. no wheezing. Pt stated she swallowed wrong this morning and now she has a hurt in her chest.  She lives alone & is very functional  >>no changes   05/08/2012 Acute OV  Complains of prod cough with green/yellow mucus, increased SOB, wheezing, chills, occ right ear discomfort x1 week, worse x2 days.  Still smokes occasionally. Smells of smoke in office today .  Not taking spiriva daily . Discussed daily , regular use.  Patient denies any hemoptysis, orthopnea, PND, leg swelling, or fever.   Review  of Systems Constitutional:   No  weight loss, night sweats,  Fevers, chills, + fatigue, or  lassitude.  HEENT:   No headaches,  Difficulty swallowing,  Tooth/dental problems, or  Sore throat,                No sneezing, itching, ear ache, nasal congestion, post nasal drip,   CV:  No chest pain,  Orthopnea, PND, swelling in lower extremities, anasarca, dizziness, palpitations, syncope.   GI  No heartburn, indigestion, abdominal pain, nausea, vomiting, diarrhea, change in bowel habits, loss of appetite, bloody stools.   Resp:    No chest wall deformity  Skin: no rash or lesions.  GU: no dysuria, change in color of urine, no urgency or frequency.  No flank pain, no hematuria   MS:  No joint pain or swelling.  No decreased range of motion.  No back pain.  Psych:  No change in mood or affect. No depression or anxiety.  No memory loss.         Objective:   Physical Exam   Gen. Pleasant, thin ,  in no distress ENT - no lesions, no post nasal drip Neck: No JVD, no thyromegaly, no carotid bruits Lungs: exp wheezing, few rhonchi  Cardiovascular: Rhythm regular, heart sounds  normal, no murmurs or gallops, no peripheral edema Musculoskeletal: No deformities, no cyanosis or clubbing         Assessment & Plan:

## 2012-05-08 NOTE — Assessment & Plan Note (Signed)
Acute COPD, exacerbation. Patient declines. Chest x-ray today. Xopenex neb treatment in office today.  Plan  Begin Augmentin 875mg  Twice daily  For 7  days -take with food, eat yogurt daily  Fluids and rest.  Mucinex DM Twice daily  As needed  Cough/congestion  Flutter valve (green tube) Three times a day  As needed  For congestion  Prednisone taper over next week.  Saline nasal rinses As needed   Please contact office for sooner follow up if symptoms do not improve or worsen or seek emergency care  follow up Dr. Vassie Loll  In 3 weeks as planned and  As needed   Please contact office for sooner follow up if symptoms do not improve or worsen or seek emergency care

## 2012-05-08 NOTE — Patient Instructions (Addendum)
Begin Augmentin 875mg  Twice daily  For 7  days -take with food, eat yogurt daily  Fluids and rest.  Mucinex DM Twice daily  As needed  Cough/congestion  Flutter valve (green tube) Three times a day  As needed  For congestion  Prednisone taper over next week.  Saline nasal rinses As needed   Please contact office for sooner follow up if symptoms do not improve or worsen or seek emergency care  follow up Dr. Vassie Loll  In 3 weeks as planned and  As needed   Please contact office for sooner follow up if symptoms do not improve or worsen or seek emergency care

## 2012-05-08 NOTE — Addendum Note (Signed)
Addended by: Boone Master E on: 05/08/2012 12:39 PM   Modules accepted: Orders

## 2012-05-09 ENCOUNTER — Telehealth: Payer: Self-pay | Admitting: Internal Medicine

## 2012-05-09 ENCOUNTER — Other Ambulatory Visit: Payer: Self-pay | Admitting: Family Medicine

## 2012-05-09 DIAGNOSIS — E876 Hypokalemia: Secondary | ICD-10-CM

## 2012-05-09 NOTE — Telephone Encounter (Signed)
Pt inquiring about blood work results from 04/21/12. Please assist.

## 2012-05-09 NOTE — Telephone Encounter (Signed)
Patient notified by telephone. 

## 2012-05-15 ENCOUNTER — Telehealth: Payer: Self-pay | Admitting: Internal Medicine

## 2012-05-15 NOTE — Telephone Encounter (Signed)
Pt would like a copy of her blood work that was done on 04/21/12 mailed to her.

## 2012-05-15 NOTE — Telephone Encounter (Signed)
Sent by mail

## 2012-05-30 ENCOUNTER — Telehealth: Payer: Self-pay | Admitting: Pulmonary Disease

## 2012-05-30 ENCOUNTER — Ambulatory Visit: Payer: Medicare Other | Admitting: Pulmonary Disease

## 2012-05-30 NOTE — Telephone Encounter (Signed)
Returning call can be reached at [phone].Raylene Everts

## 2012-05-30 NOTE — Telephone Encounter (Signed)
[id number] x1 

## 2012-05-30 NOTE — Telephone Encounter (Signed)
I spoke with pt. She stated she had to cancel appt today bc she had no transportation. She stated she feels fine. I advised pt to call us if she needs u prior to next appt scheduled w/ TP. She stated she would. Will forward to RA as an FYI.

## 2012-05-31 ENCOUNTER — Other Ambulatory Visit (INDEPENDENT_AMBULATORY_CARE_PROVIDER_SITE_OTHER): Payer: Medicare Other

## 2012-05-31 DIAGNOSIS — E876 Hypokalemia: Secondary | ICD-10-CM

## 2012-05-31 LAB — BASIC METABOLIC PANEL
BUN: 20 mg/dL (ref 6–23)
Calcium: 9 mg/dL (ref 8.4–10.5)
Creatinine, Ser: 0.8 mg/dL (ref 0.4–1.2)
GFR: 68.91 mL/min (ref >60.00)
Potassium: 3.3 mEq/L — ABNORMAL LOW (ref 3.5–5.1)

## 2012-05-31 LAB — MAGNESIUM: Magnesium: 1.9 mg/dL (ref 1.5–2.5)

## 2012-06-12 NOTE — Telephone Encounter (Signed)
Pt would like you to call her about her potassium labs done 05/31/12. Pls call at your earliest conveniece.

## 2012-06-13 ENCOUNTER — Telehealth: Payer: Self-pay | Admitting: Family Medicine

## 2012-06-13 ENCOUNTER — Other Ambulatory Visit: Payer: Self-pay | Admitting: Internal Medicine

## 2012-06-13 DIAGNOSIS — E876 Hypokalemia: Secondary | ICD-10-CM

## 2012-06-13 MED ORDER — POTASSIUM CHLORIDE ER 10 MEQ PO TBCR: 20.0000 meq | EXTENDED_RELEASE_TABLET | Freq: Every day | ORAL | Status: DC

## 2012-06-13 NOTE — Telephone Encounter (Signed)
Talked with patient no diuretic    No new leg cramps  No diarrhea but does sweat a lot at nignt.  Plan add potassium 10 meq 2 po qd and recheck BMP in 2-4 weeks then decide on  Plan.  FU

## 2012-06-13 NOTE — Telephone Encounter (Signed)
Patient would like the result of her potassium done on 05/31/12.  Please advise.  Thanks!!

## 2012-06-14 ENCOUNTER — Encounter: Payer: Self-pay | Admitting: Internal Medicine

## 2012-06-28 ENCOUNTER — Telehealth: Payer: Self-pay | Admitting: Internal Medicine

## 2012-06-28 ENCOUNTER — Other Ambulatory Visit: Payer: Self-pay | Admitting: Internal Medicine

## 2012-06-28 NOTE — Telephone Encounter (Signed)
Would you like to see the pt in the office or call in medication?

## 2012-06-28 NOTE — Telephone Encounter (Signed)
Pls advise.  

## 2012-06-28 NOTE — Telephone Encounter (Signed)
Patient Information:  Caller Name: [name] [name]  Phone: [phone]  Patient: [name] [name], [name] [name]  Gender: Female  DOB: [birth year]  Age: 79 Years  PCP: Berniece Andreas (Family Practice)  Office Follow Up:  Does the office need to follow up with this patient?: Yes  Instructions For The Office: Confirm that prescription has been called in and recommendations by Dr. Fabian Sharp for thrush and "stomachache" caused by Potassium.   Symptoms  Reason For Call & Symptoms: [name] [name] states she " feels like hell since she started the Potassium on 06/23/12. Having " stomachache" onset 06/15/12. Mouth is coated white on 06/28/12. Mouth is very dry. States her mouth has tasted "like the city sewer x 2 to 3 days." States side effects of Potassium lists thrush and abdominal discomfort. Has see provider today disposition due to intemittent mild to moderate abdominal pain per abdominal pain protocol. Per mouth lesions protocol has see provider within 24 hrs due to painful white areas on mouth unresponsive to 72 hrs of home care. Declined appointment. Thinks Potassium has caused her abdominal discomfort and "Thrush." Requesting medication be called in to [facility] at [facility] [phone]. Please call [name] at [phone]  Reviewed Health History In EMR: Yes  Reviewed Medications In EMR: Yes  Reviewed Allergies In EMR: Yes  Reviewed Surgeries / Procedures: Yes  Date of Onset of Symptoms: 06/15/2012  Guideline(s) Used:  Abdominal Pain - Female  Abdominal Pain - Female  Disposition Per Guideline:   See Today in Office  Reason For Disposition Reached:   Moderate or mild pain that comes and goes (cramps) lasts > 24 hours  Advice Given:  N/A  Patient Refused Recommendation:  Patient Requests Prescription  Prefers prescription be called in to [facility] at [facility]  [phone] for thrush due to Potassium ordered on 06/13/12.

## 2012-06-29 NOTE — Telephone Encounter (Signed)
Not typical for potassium to cause thrush although can cause sx .if iiritation  Gi tracts.  Can stop the potassium temporarily and plan OV  .  May need a different formulation of potassium

## 2012-06-30 ENCOUNTER — Ambulatory Visit (INDEPENDENT_AMBULATORY_CARE_PROVIDER_SITE_OTHER): Payer: Medicare Other | Admitting: Adult Health

## 2012-06-30 ENCOUNTER — Encounter: Payer: Self-pay | Admitting: Adult Health

## 2012-06-30 VITALS — BP 98/58 | HR 71 | Temp 96.7°F | Ht 63.0 in | Wt 105.0 lb

## 2012-06-30 DIAGNOSIS — J449 Chronic obstructive pulmonary disease, unspecified: Secondary | ICD-10-CM

## 2012-06-30 MED ORDER — TIOTROPIUM BROMIDE MONOHYDRATE 18 MCG IN CAPS: 18.0000 ug | ORAL_CAPSULE | Freq: Every day | RESPIRATORY_TRACT | Status: DC

## 2012-06-30 NOTE — Assessment & Plan Note (Addendum)
Compensated on spiriva Encouraged on smoking cessation.  Oral care disucssed with meds/inhalers.   Plan  Continue on current regimen  May take pills with applesauce  May try Armor /Hammer toothpaste baking soda/peroxide -sensitive  Brush/rinse and gargle after inhaler use.  NO smoking  Follow up Dr. Vassie Loll  In 3 months and As needed

## 2012-06-30 NOTE — Progress Notes (Signed)
Subjective:    Patient ID: [name] [name], female    DOB: August 20, 1927, 79 y.o.   MRN: [id number]  HPI  PCP - [name]   84/F,ex - smoker, with CAD, PVD, gold stg 2 COPD & recurrent LLL atelectasis  6/12 >>Spirometry >>FEV1 61%  She quit smoking in 5/12  She was first admitted 6/12 for hypoxia & LLL atelectasis.  Ct chest showed Volume loss in the inferior left hemithorax with filling defect of the left lower and less so left upper lobe endobronchial tree.  Bronchoscopy showed LLL endobronchial narrowing, without discrete mass. Papule on rt mainstem bronchus was brushed - cytology all neg. BAL cx, afb, fungal neg  Re- admitted 8/19-8/23/12 for pneumonia & LLL atx. Rpt bscopy >>thick mucoid secretions with difficulty passing scope. Cytology was neg for malignancy . Path showed AMYLOID DEPOSITION AND DYSTROPHIC CALCIFICATIONS.  CT sinuses 8/12 neg  Rpt Ct chest 9/12 -improved aeration LLL, calcified bronchi, no endobronchial lesions , 1.8 cm subcarinal LN - NEW compared to 8/12  11/05/2010 Acute OV . >>rx Augmentin x 10d  CT 5/13.>>No residual lymphadenopathy. No evidence of metastatic breast cancer. Stable chronic lung disease with biapical scarring, diffuse calcifications of the tracheobronchial tree and mucous impaction of the lingular bronchus  Dulera caused her mouth irritation & shakes     03/02/12  Flare in dec '13 - >>augmentin and steroid taper  breathing is better. Cough is better. no wheezing. Pt stated she swallowed wrong this morning and now she has a hurt in her chest.  She lives alone & is very functional  >>no changes   05/08/12  Acute OV  Complains of prod cough with green/yellow mucus, increased SOB, wheezing, chills, occ right ear discomfort x1 week, worse x2 days.  Not taking spiriva daily . Discussed daily , regular use.  Patient denies any hemoptysis, orthopnea, PND, leg swelling, or fever. >augmentin /pred taper   06/30/2012 Follow up  Pt returns for COPD  follow up .  Pt reports breathing is doing well, has increased outdoor activities and is feeling good-- denies any new concerns at this time Does have some mouth irritation on/off. Feels KDur pills are causing irritation.  We discussed inhalers and drying  effects of meds.  Advised on mouth care , biotene , rinsing after inhalers , etc.  Also discussed smoking cessation as she still smokes on/off.  No chest pain, edema, orthopnea, hemoptysis.    Review of Systems Constitutional:   No  weight loss, night sweats,  Fevers, chills, + fatigue, or  lassitude.  HEENT:   No headaches,  Difficulty swallowing,  Tooth/dental problems, or  Sore throat,                No sneezing, itching, ear ache, nasal congestion, post nasal drip,   CV:  No chest pain,  Orthopnea, PND, swelling in lower extremities, anasarca, dizziness, palpitations, syncope.   GI  No heartburn, indigestion, abdominal pain, nausea, vomiting, diarrhea, change in bowel habits, loss of appetite, bloody stools.   Resp:    No chest wall deformity  Skin: no rash or lesions.  GU: no dysuria, change in color of urine, no urgency or frequency.  No flank pain, no hematuria   MS:  No joint pain or swelling.  No decreased range of motion.  No back pain.  Psych:  No change in mood or affect. No depression or anxiety.  No memory loss.         Objective:   Physical  Exam   Gen. Pleasant, thin ,  in no distress ENT - no lesions, no post nasal drip Neck: No JVD, no thyromegaly, no carotid bruits Lungs: few trace  rhonchi  Cardiovascular: Rhythm regular, heart sounds  normal, no murmurs or gallops, no peripheral edema Musculoskeletal: No deformities, no cyanosis or clubbing         Assessment & Plan:

## 2012-06-30 NOTE — Patient Instructions (Addendum)
Continue on current regimen  May take pills with applesauce  May try Armor /Hammer toothpaste baking soda/peroxide -sensitive  Brush/rinse and gargle after inhaler use.  NO smoking  Follow up Dr. Vassie Loll  In 3 months and As needed

## 2012-06-30 NOTE — Telephone Encounter (Signed)
Left message on home phone for the pt to return my call. 

## 2012-07-05 NOTE — Telephone Encounter (Signed)
Left message on home phone of the pt to return my call.

## 2012-07-06 NOTE — Telephone Encounter (Signed)
Spoke to the pt.  She no longer has any "thrush" problems.  Has made a lab appt for 07/07/12.

## 2012-07-07 ENCOUNTER — Other Ambulatory Visit (INDEPENDENT_AMBULATORY_CARE_PROVIDER_SITE_OTHER): Payer: Medicare Other

## 2012-07-07 ENCOUNTER — Telehealth: Payer: Self-pay | Admitting: Internal Medicine

## 2012-07-07 DIAGNOSIS — E876 Hypokalemia: Secondary | ICD-10-CM

## 2012-07-07 LAB — BASIC METABOLIC PANEL
CO2: 29 mEq/L (ref 19–32)
Calcium: 9.4 mg/dL (ref 8.4–10.5)
Chloride: 107 mEq/L (ref 96–112)
Sodium: 141 mEq/L (ref 135–145)

## 2012-07-07 NOTE — Telephone Encounter (Signed)
Pt states she needs some advice about her potassium. Also, pt states that Dr. Fabian Sharp wanted to know if she is on any dieretics, pt is not.  Pt says that she is urinating frequently at night, at least 6 times a night and needs to know if this has anything to do with her potassium levels. Please advise

## 2012-07-10 NOTE — Telephone Encounter (Signed)
Left message on home phone for the pt to return my call. 

## 2012-07-11 NOTE — Telephone Encounter (Signed)
I spoke to the pt.  Let her know that the potassium was not the problem.  She stated she got up 7 times last night and that is better than what it has been.  She denies any urinary sx such as urgency, dysuria or fever.  Since she has had a better night, instructed her to keep an eye on her sx.  If worsening she should call immediately.  If staying the same than she should call back by Friday morning or sooner.

## 2012-08-07 ENCOUNTER — Ambulatory Visit (INDEPENDENT_AMBULATORY_CARE_PROVIDER_SITE_OTHER): Payer: Medicare Other | Admitting: Cardiology

## 2012-08-07 ENCOUNTER — Encounter: Payer: Self-pay | Admitting: Cardiology

## 2012-08-07 VITALS — BP 160/78 | HR 71 | Ht 63.0 in | Wt 101.8 lb

## 2012-08-07 DIAGNOSIS — I739 Peripheral vascular disease, unspecified: Secondary | ICD-10-CM

## 2012-08-07 DIAGNOSIS — I679 Cerebrovascular disease, unspecified: Secondary | ICD-10-CM

## 2012-08-07 DIAGNOSIS — E785 Hyperlipidemia, unspecified: Secondary | ICD-10-CM

## 2012-08-07 DIAGNOSIS — I701 Atherosclerosis of renal artery: Secondary | ICD-10-CM

## 2012-08-07 DIAGNOSIS — I2510 Atherosclerotic heart disease of native coronary artery without angina pectoris: Secondary | ICD-10-CM

## 2012-08-07 MED ORDER — AMLODIPINE BESYLATE 10 MG PO TABS: 10.0000 mg | ORAL_TABLET | Freq: Every day | ORAL | Status: DC

## 2012-08-07 MED ORDER — NITROGLYCERIN 0.4 MG SL SUBL: 0.4000 mg | SUBLINGUAL_TABLET | SUBLINGUAL | Status: DC | PRN

## 2012-08-07 NOTE — Patient Instructions (Addendum)
Your physician wants you to follow-up in: ONE YEAR WITH DR CRENSHAW You will receive a reminder letter in the mail two months in advance. If you don't receive a letter, please call our office to schedule the follow-up appointment.   INCREASE AMLODIPINE TO 10 MG ONCE DAILY  Your physician has requested that you have a carotid duplex. This test is an ultrasound of the carotid arteries in your neck. It looks at blood flow through these arteries that supply the brain with blood. Allow one hour for this exam. There are no restrictions or special instructions.   

## 2012-08-07 NOTE — Assessment & Plan Note (Signed)
Continue aspirin. Patient is being managed medically. Followed by Dr. Excell Seltzer.

## 2012-08-07 NOTE — Assessment & Plan Note (Signed)
Continue aspirin. Intolerant to statins. Followup carotid Dopplers in September 2014.

## 2012-08-07 NOTE — Assessment & Plan Note (Signed)
Continue diet. Intolerant to statins. 

## 2012-08-07 NOTE — Assessment & Plan Note (Signed)
Blood pressure elevated. Increase amlodipine to 10 mg daily. 

## 2012-08-07 NOTE — Progress Notes (Signed)
HPI: [name] [name] is a pleasant female who has a history of coronary artery disease, status post stenting of the right coronary artery in December 2005. Patient had cardiac catheterization in November of 2011 secondary to chest pain which revealed left main normal. The LAD had proximal luminal irregularities. First diagonal was large with ostial 25% stenosis. Circumflex in the AV groove had mid long 25% stenosis. Obtuse marginal was large and normal. The right coronary artery with long in-stent 30% stenosis. PDA was moderate size and normal. Posterolateral was small and normal. EF 65. Echo in June of 2012 showed normal LV function, mild AI and MR. Last renal Dopplers were in August 2010. Renal arteries showed no stenosis. There was a right renal mass. However dedicated ultrasound showed a renal cyst. She also has a history of cerebrovascular disease. Her most recent carotid Dopplers were performed in Sept 2013 showed a 40-59% bilateral stenosis. Followup was recommended in 1 year. She also has a history of renal artery stenosis. She also has peripheral vascular disease and has seen Dr. Excell Seltzer. An arteriogram revealed occlusion of the left SFA. It was unfavorable for intervention and medical therapy was recommended if possible. ABIs in April of 2014 showed moderate decrease on the left and mild decrease on the right. Patient is being treated medically. Since I last saw her in June of 2013, there is no increased dyspnea on exertion, orthopnea, PND, pedal edema, syncope or chest pain. She does have weakness in her left lower extremity with ambulation.   Current Outpatient Prescriptions  Medication Sig Dispense Refill  . amLODipine (NORVASC) 5 MG tablet TAKE ONE TABLET BY MOUTH ONE TIME DAILY  90 tablet  2  . aspirin 81 MG EC tablet Take 81 mg by mouth daily.        . Cholecalciferol (VITAMIN D) 1000 UNITS capsule Take 1,000 Units by mouth daily.        Marland Kitchen dextromethorphan (DELSYM) 30 MG/5ML liquid Take 60 mg  by mouth as needed.      . fish oil-omega-3 fatty acids 1000 MG capsule Take 1 g by mouth daily.       Marland Kitchen guaiFENesin (MUCINEX) 600 MG 12 hr tablet Take 1,200 mg by mouth as needed.       . isosorbide mononitrate (IMDUR) 30 MG 24 hr tablet Take 1 tablet (30 mg total) by mouth daily.  90 tablet  3  . levalbuterol (XOPENEX HFA) 45 MCG/ACT inhaler Inhale 2 puffs into the lungs every 6 (six) hours as needed.  1 Inhaler  2  . nitroGLYCERIN (NITROSTAT) 0.4 MG SL tablet Place 1 tablet (0.4 mg total) under the tongue every 5 (five) minutes as needed.  25 tablet  3  . tiotropium (SPIRIVA) 18 MCG inhalation capsule Place 1 capsule (18 mcg total) into inhaler and inhale daily.  30 capsule  6  . traMADol (ULTRAM) 50 MG tablet TAKE ONE TABLET BY MOUTH EVERY EIGHT HOURS AS NEEDED FOR PAIN  40 tablet  0   No current facility-administered medications for this visit.     Past Medical History  Diagnosis Date  . POSTHERPETIC NEURALGIA 08/25/2006  . CARCINOMA, BASAL CELL 02/13/2006  . NEOPLASM, SKIN, UNCERTAIN BEHAVIOR 12/16/2008  . HYPOTHYROIDISM 01/30/2007  . Unspecified vitamin D deficiency 02/07/2007  . HYPERLIPIDEMIA 03/10/2008  . UNSPECIFIED ANEMIA 03/18/2008  . THROMBOCYTOPENIA 07/30/2008  . LOSS, HEARING NOS 08/25/2006  . HYPERTENSION 02/13/2006  . UNSTABLE ANGINA 12/12/2009  . CORONARY ARTERY DISEASE 02/13/2006  . CAROTID ARTERY  DISEASE 10/03/2006  . RENAL ARTERY STENOSIS 08/01/2008  . PERIPHERAL VASCULAR DISEASE 02/13/2006  . Chronic rhinitis 12/13/2006  . CHRONIC OBSTRUCTIVE PULMONARY DISEASE, ACUTE EXACERBATION 11/17/2009  . DERMATITIS 09/25/2007  . DEGENERATIVE JOINT DISEASE 09/27/2006  . OSTEOARTHRITIS, HAND 12/16/2008  . SPINAL STENOSIS 11/01/2006  . LIVER FUNCTION TESTS, ABNORMAL 01/22/2009  . UNS ADVRS EFF UNS RX MEDICINAL&BIOLOGICAL SBSTNC 01/30/2007  . Personal history of malignant neoplasm of breast 07/24/2008  . NEPHROLITHIASIS, HX OF 02/27/2010  . Vitreous detachment   . Shingles     recurrent    . Head trauma     closed  . Personal history of urinary calculi   . Post-traumatic wound infection 12/01/2010    Mild streaking superiorly and on one day of septra  Will give rocephin and add keflex  adn follow up     Past Surgical History  Procedure Laterality Date  . Lumbar laminectomy    . Cataract extraction    . Abdominal hysterectomy    . Breast lumpectomy    . Tonsillectomy    . Coronary angioplasty with stent placement      bilat iliac stents, left renal artery and rt coronary artery last myoview 8/09 with EF 70%  . Skin cancer excision      History   Social History  . Marital Status: Widowed    Spouse Name: N/A    Number of Children: N/A  . Years of Education: N/A   Occupational History  . Not on file.   Social History Main Topics  . Smoking status: Current Some Day Smoker -- 1.00 packs/day for 30 years    Types: Cigarettes    Last Attempt to Quit: 06/15/2010  . Smokeless tobacco: Never Used     Comment: 2-3 cigs per week/// 06/30/12  . Alcohol Use: Yes     Comment: socially  . Drug Use: No  . Sexually Active: Not on file   Other Topics Concern  . Not on file   Social History Narrative   Widowed   5 hours of sleep   Has friends checking on her   No Pets    ROS: no fevers or chills, productive cough, hemoptysis, dysphasia, odynophagia, melena, hematochezia, dysuria, hematuria, rash, seizure activity, orthopnea, PND, pedal edema. Remaining systems are negative.  Physical Exam: Well-developed frail in no acute distress.  Skin is warm and dry.  HEENT is normal.  Neck is supple.  Chest diffuse rhonchi Cardiovascular exam is regular rate and rhythm.  Abdominal exam nontender or distended. No masses palpated. Extremities show no edema. neuro grossly intact  ECG sinus rhythm at a rate of 75. Left axis deviation. Prior septal infarct. Nonspecific ST changes.

## 2012-08-07 NOTE — Assessment & Plan Note (Signed)
Continue aspirin 

## 2012-08-07 NOTE — Assessment & Plan Note (Signed)
Continue aspirin. Intolerant to statins. 

## 2012-08-15 ENCOUNTER — Other Ambulatory Visit: Payer: Self-pay | Admitting: *Deleted

## 2012-08-15 ENCOUNTER — Ambulatory Visit (INDEPENDENT_AMBULATORY_CARE_PROVIDER_SITE_OTHER): Payer: Medicare HMO | Admitting: Internal Medicine

## 2012-08-15 ENCOUNTER — Encounter: Payer: Self-pay | Admitting: Internal Medicine

## 2012-08-15 VITALS — BP 150/66 | HR 72 | Temp 98.1°F | Wt 103.0 lb

## 2012-08-15 DIAGNOSIS — I10 Essential (primary) hypertension: Secondary | ICD-10-CM

## 2012-08-15 DIAGNOSIS — E876 Hypokalemia: Secondary | ICD-10-CM | POA: Insufficient documentation

## 2012-08-15 DIAGNOSIS — Z853 Personal history of malignant neoplasm of breast: Secondary | ICD-10-CM | POA: Insufficient documentation

## 2012-08-15 DIAGNOSIS — N644 Mastodynia: Secondary | ICD-10-CM | POA: Insufficient documentation

## 2012-08-15 DIAGNOSIS — R350 Frequency of micturition: Secondary | ICD-10-CM | POA: Insufficient documentation

## 2012-08-15 LAB — POCT URINALYSIS DIP (MANUAL ENTRY)
Glucose, UA: NEGATIVE
Leukocytes, UA: NEGATIVE
Nitrite, UA: NEGATIVE
Spec Grav, UA: 1.02
Urobilinogen, UA: 0.2

## 2012-08-15 LAB — BASIC METABOLIC PANEL
BUN: 20 mg/dL (ref 6–23)
Chloride: 106 mEq/L (ref 96–112)
Glucose, Bld: 109 mg/dL — ABNORMAL HIGH (ref 70–99)
Potassium: 3.6 mEq/L (ref 3.5–5.1)
Sodium: 140 mEq/L (ref 135–145)

## 2012-08-15 LAB — MAGNESIUM: Magnesium: 1.9 mg/dL (ref 1.5–2.5)

## 2012-08-15 NOTE — Progress Notes (Signed)
Chief Complaint  Patient presents with  . Breast Pain    Rt side. Ongoing for 1 week.    HPI: Patient comes in today for SDA for  new problem evaluation.  Co 1 weeksof burning breast pain right  Radiate to nipple  No trauma abut thought from working in yard  Not worse with inspritation ;feels lump in that area .   Same area where she discovered her breast cancer in the 1990s. Had mammo in the spring   . Apparently was okay  Pain near area of biposy rx etc.    Also asks about followup of the potassium messages weren't answered. She was given potassium in the repeat test was good but wasn't sure whether she was supposed to stay on it or not she has been off of potassium at least a month or 2. She is not on a diuretic but has significant urinary frequency she states that the blood sugar elevation from the last test was because she was sucking on mint candies.  Last chest x-ray was within the year showed COPD  History of breast cancer right lumpectomy radiation I don't have the records. ROS: See pertinent positives and negatives per HPI. Lungs are difficult no different hard to cough up mucus. Feels that her legs are getting weaker leg pains did not respond to shots by Dr. Algis Greenhouse this will be going back to see him. She is stable in the cardiovascular area has vascular disease. No recent falling. Eats about one meal a day but very well  Past Medical History  Diagnosis Date  . POSTHERPETIC NEURALGIA 08/25/2006  . CARCINOMA, BASAL CELL 02/13/2006  . NEOPLASM, SKIN, UNCERTAIN BEHAVIOR 12/16/2008  . HYPOTHYROIDISM 01/30/2007  . Unspecified vitamin D deficiency 02/07/2007  . HYPERLIPIDEMIA 03/10/2008  . UNSPECIFIED ANEMIA 03/18/2008  . THROMBOCYTOPENIA 07/30/2008  . LOSS, HEARING NOS 08/25/2006  . HYPERTENSION 02/13/2006  . UNSTABLE ANGINA 12/12/2009  . CORONARY ARTERY DISEASE 02/13/2006  . CAROTID ARTERY DISEASE 10/03/2006  . RENAL ARTERY STENOSIS 08/01/2008  . PERIPHERAL VASCULAR DISEASE 02/13/2006  .  Chronic rhinitis 12/13/2006  . CHRONIC OBSTRUCTIVE PULMONARY DISEASE, ACUTE EXACERBATION 11/17/2009  . DERMATITIS 09/25/2007  . DEGENERATIVE JOINT DISEASE 09/27/2006  . OSTEOARTHRITIS, HAND 12/16/2008  . SPINAL STENOSIS 11/01/2006  . LIVER FUNCTION TESTS, ABNORMAL 01/22/2009  . UNS ADVRS EFF UNS RX MEDICINAL&BIOLOGICAL SBSTNC 01/30/2007  . Personal history of malignant neoplasm of breast 07/24/2008  . NEPHROLITHIASIS, HX OF 02/27/2010  . Vitreous detachment   . Shingles     recurrent  . Head trauma     closed  . Personal history of urinary calculi   . Post-traumatic wound infection 12/01/2010    Mild streaking superiorly and on one day of septra  Will give rocephin and add keflex  adn follow up     Family History  Problem Relation Age of Onset  . Breast cancer Daughter   . Tuberculosis Father     History   Social History  . Marital Status: Widowed    Spouse Name: N/A    Number of Children: N/A  . Years of Education: N/A   Social History Main Topics  . Smoking status: Current Some Day Smoker -- 1.00 packs/day for 30 years    Types: Cigarettes    Last Attempt to Quit: 06/15/2010  . Smokeless tobacco: Never Used     Comment: 2-3 cigs per week/// 06/30/12  . Alcohol Use: Yes     Comment: socially  . Drug Use: No  .  Sexually Active: None   Other Topics Concern  . None   Social History Narrative   Widowed   5 hours of sleep   Has friends checking on her   No Pets    Outpatient Encounter Prescriptions as of 08/15/2012  Medication Sig Dispense Refill  . amLODipine (NORVASC) 10 MG tablet Take 1 tablet (10 mg total) by mouth daily.  90 tablet  4  . aspirin 81 MG EC tablet Take 81 mg by mouth daily.        . Cholecalciferol (VITAMIN D) 1000 UNITS capsule Take 1,000 Units by mouth daily.        . fish oil-omega-3 fatty acids 1000 MG capsule Take 1 g by mouth daily.       . isosorbide mononitrate (IMDUR) 30 MG 24 hr tablet Take 1 tablet (30 mg total) by mouth daily.  90  tablet  3  . nitroGLYCERIN (NITROSTAT) 0.4 MG SL tablet Place 1 tablet (0.4 mg total) under the tongue every 5 (five) minutes as needed.  25 tablet  3  . tiotropium (SPIRIVA) 18 MCG inhalation capsule Place 1 capsule (18 mcg total) into inhaler and inhale daily.  30 capsule  6  . traMADol (ULTRAM) 50 MG tablet TAKE ONE TABLET BY MOUTH EVERY EIGHT HOURS AS NEEDED FOR PAIN  40 tablet  0  . levalbuterol (XOPENEX HFA) 45 MCG/ACT inhaler Inhale 2 puffs into the lungs every 6 (six) hours as needed.  1 Inhaler  2  . [DISCONTINUED] dextromethorphan (DELSYM) 30 MG/5ML liquid Take 60 mg by mouth as needed.      . [DISCONTINUED] guaiFENesin (MUCINEX) 600 MG 12 hr tablet Take 1,200 mg by mouth as needed.        No facility-administered encounter medications on file as of 08/15/2012.    EXAM:  BP 150/66  Pulse 72  Temp(Src) 98.1 F (36.7 C) (Oral)  Wt 103 lb (46.72 kg)  BMI 18.25 kg/m2  SpO2 98%  Body mass index is 18.25 kg/(m^2).  GENERAL: vitals reviewed and listed above, alert, oriented, frail-appearing appears well hydrated and in no acute distress although uncomfortable when touching her right breast.  HEENT: atraumatic, conjunctiva  clear, no obvious abnormalities on inspection of external nose and ears  NECK: no obvious masses on inspection palpation no retractions or respiratory distress  LUNGS: Decreased breath sounds but no wheezes rales. Few rhonchi.  CV: HRRR, no clubbing cyanosis or  peripheral edema nl cap refill  Breast chest wall. Well-healed scar right superior breast lumpiness in the upper lateral quadrant area tenderness no obvious discharge or dimpling otherwise except related to the scar. Axilla without adenopathy noted  no obvious bony tenderness. Abdomen without obvious organomegaly or tenderness. MS: moves all extremities  . Thin and frail ambulatory independent getting up on exam table but takes her a while. PSYCH: pleasant and cooperative, no obvious depression or  anxiety  ASSESSMENT AND PLAN:  Discussed the following assessment and plan:  Pain of right breast - Plan: Basic metabolic panel, Magnesium, Hemoglobin A1c, POCT urinalysis dipstick, US Breast Right, MM Digital Diagnostic Unilat R, Ambulatory referral to General Surgery  Hypertension - Plan: Basic metabolic panel, Magnesium, Hemoglobin A1c, POCT urinalysis dipstick  HYPERTENSION - Plan: Basic metabolic panel, Magnesium, Hemoglobin A1c, POCT urinalysis dipstick  Low blood potassium - Plan: Basic metabolic panel, Magnesium, Hemoglobin A1c, POCT urinalysis dipstick  Urinary frequency - Plan: Basic metabolic panel, Magnesium, Hemoglobin A1c, POCT urinalysis dipstick  HX: breast cancer - Plan: US Breast  Right, MM Digital Diagnostic Unilat R, Ambulatory referral to General Surgery  -Patient advised to return or notify health care team  if symptoms worsen or persist or new concerns arise.  Patient Instructions  Will arrange    Breast imaging and  Referral surgery opinion.   About the btreast pain and lumpiness Check potassium today   If ok no further potassium supplements .   Fu depending on labs etc      Neta Mends. Nusaybah Ivie M.D.

## 2012-08-15 NOTE — Patient Instructions (Addendum)
Will arrange    Breast imaging and  Referral surgery opinion.   About the btreast pain and lumpiness Check potassium today   If ok no further potassium supplements .   Fu depending on labs etc

## 2012-08-15 NOTE — Telephone Encounter (Signed)
Did a refill for isosorbide 30mg  no. 90 with 3 additional refill today for [name] [name]. Spoke with pharmacist. Dennie Maizes

## 2012-08-16 ENCOUNTER — Other Ambulatory Visit: Payer: Self-pay | Admitting: Dermatology

## 2012-08-17 ENCOUNTER — Telehealth: Payer: Self-pay | Admitting: Family Medicine

## 2012-08-17 ENCOUNTER — Other Ambulatory Visit: Payer: Self-pay | Admitting: Pulmonary Disease

## 2012-08-17 ENCOUNTER — Other Ambulatory Visit: Payer: Self-pay | Admitting: Family Medicine

## 2012-08-17 DIAGNOSIS — R8290 Unspecified abnormal findings in urine: Secondary | ICD-10-CM

## 2012-08-17 NOTE — Telephone Encounter (Signed)
Pt reported she seen Dr. Ethelene Hal on 08/16/12 and he is to do an MRI.  He had lab work ordered.

## 2012-08-18 ENCOUNTER — Other Ambulatory Visit: Payer: Medicare Other

## 2012-08-18 DIAGNOSIS — R8290 Unspecified abnormal findings in urine: Secondary | ICD-10-CM

## 2012-08-19 LAB — URINALYSIS, MICROSCOPIC ONLY: Casts: NONE SEEN

## 2012-08-25 ENCOUNTER — Telehealth: Payer: Self-pay | Admitting: Family Medicine

## 2012-08-25 NOTE — Telephone Encounter (Signed)
Yes please do chest x ray with rib detail  X ray on left   Dx rib pain

## 2012-08-25 NOTE — Telephone Encounter (Signed)
Went and had breast ultrasound that turned out normal.  A rib x-ray was advised after the visit.  Would you like to order?  Please advise.  Thanks!!

## 2012-08-28 ENCOUNTER — Ambulatory Visit (INDEPENDENT_AMBULATORY_CARE_PROVIDER_SITE_OTHER)
Admission: RE | Admit: 2012-08-28 | Discharge: 2012-08-28 | Disposition: A | Discharge status: Home / Self Care | Payer: Medicare Other | Source: Ambulatory Visit | Attending: Internal Medicine | Admitting: Internal Medicine

## 2012-08-28 ENCOUNTER — Other Ambulatory Visit: Payer: Self-pay | Admitting: Family Medicine

## 2012-08-28 DIAGNOSIS — R0781 Pleurodynia: Secondary | ICD-10-CM

## 2012-08-28 DIAGNOSIS — R079 Chest pain, unspecified: Secondary | ICD-10-CM

## 2012-08-28 NOTE — Telephone Encounter (Signed)
Order placed in the system. Pt notified. 

## 2012-08-30 ENCOUNTER — Telehealth: Payer: Self-pay | Admitting: Internal Medicine

## 2012-08-30 NOTE — Telephone Encounter (Signed)
Chest x-ray did not give reason for pain.  What else should we do besides bone density?

## 2012-08-30 NOTE — Telephone Encounter (Signed)
Could consider getting a another chest ct but would rather see her first to decide   Also  get bone density   ROV t

## 2012-08-30 NOTE — Telephone Encounter (Signed)
PT states that she was sent by Dr. Fabian Sharp for an xray on Monday 7/28. She is still having pain in her breast. She is requesting the results of those test. Please assist.

## 2012-08-31 ENCOUNTER — Other Ambulatory Visit: Payer: Self-pay | Admitting: Family Medicine

## 2012-08-31 DIAGNOSIS — Z13820 Encounter for screening for osteoporosis: Secondary | ICD-10-CM

## 2012-08-31 NOTE — Telephone Encounter (Signed)
Spoke to the pt and gave her bone density info.  Placed order in the sytem.  Her ortho physician has ordered MRI.  She will have performed on 09/01/12 (Friday).  Do you still want to see her back in the office?

## 2012-09-01 NOTE — Telephone Encounter (Signed)
Yes if we need to discuss her pain .

## 2012-09-04 NOTE — Telephone Encounter (Signed)
Left message on home phone for the pt to return my call. 

## 2012-09-05 NOTE — Telephone Encounter (Signed)
Spoke to the pt.  She has an appt with Dr. Jeanella Flattery on 09/07/12.  Bone density set up for 09/14/12.

## 2012-09-07 ENCOUNTER — Ambulatory Visit (INDEPENDENT_AMBULATORY_CARE_PROVIDER_SITE_OTHER): Payer: Medicare Other | Admitting: General Surgery

## 2012-09-07 ENCOUNTER — Encounter (INDEPENDENT_AMBULATORY_CARE_PROVIDER_SITE_OTHER): Payer: Self-pay | Admitting: General Surgery

## 2012-09-07 VITALS — BP 118/62 | HR 76 | Temp 98.6°F | Resp 15 | Ht 64.0 in | Wt 101.4 lb

## 2012-09-07 DIAGNOSIS — C50911 Malignant neoplasm of unspecified site of right female breast: Secondary | ICD-10-CM

## 2012-09-07 DIAGNOSIS — C50919 Malignant neoplasm of unspecified site of unspecified female breast: Secondary | ICD-10-CM

## 2012-09-07 DIAGNOSIS — N644 Mastodynia: Secondary | ICD-10-CM

## 2012-09-07 NOTE — Progress Notes (Signed)
Subjective:   right breast pain  Patient ID: [name] [name], female   DOB: [birth year], 79 y.o.   MRN: [id number]  HPI Patient is known to me from a previous history of right breast cancer, status post right breast lumpectomy axillary dissection and radiation therapy in 1997 with no evidence of recurrence for long-term followup. Patient has multiple medical problems as noted below. She recently had the onset of right breast pain. This has now been going on for about 2 months. She describes burning pain in her upper right breast that originates near the lumpectomy scar. She describes a burning discomfort radiating over her upper breast and down to the nipple. It waxes and wanes. No particular aggravating factors. She thinks she may feel some thickening around the lumpectomy site. The skin changes or nipple discharge or other breast symptoms.  She has been seeing Dr. Fabian Sharp for this and other problems. She had a normal mammogram in May of this year. Subsequently she has had a chest x-ray and right rib detail which showed only an old left rib fracture and changes of COPD but nothing to explain her pain. Breast sonogram was also negative. She states the pain is almost disabling and is not allow her to sleep at night.  Past Medical History  Diagnosis Date  . POSTHERPETIC NEURALGIA 08/25/2006  . CARCINOMA, BASAL CELL 02/13/2006  . NEOPLASM, SKIN, UNCERTAIN BEHAVIOR 12/16/2008  . HYPOTHYROIDISM 01/30/2007  . Unspecified vitamin D deficiency 02/07/2007  . HYPERLIPIDEMIA 03/10/2008  . UNSPECIFIED ANEMIA 03/18/2008  . THROMBOCYTOPENIA 07/30/2008  . LOSS, HEARING NOS 08/25/2006  . HYPERTENSION 02/13/2006  . UNSTABLE ANGINA 12/12/2009  . CORONARY ARTERY DISEASE 02/13/2006  . CAROTID ARTERY DISEASE 10/03/2006  . RENAL ARTERY STENOSIS 08/01/2008  . PERIPHERAL VASCULAR DISEASE 02/13/2006  . Chronic rhinitis 12/13/2006  . CHRONIC OBSTRUCTIVE PULMONARY DISEASE, ACUTE EXACERBATION 11/17/2009  . DERMATITIS 09/25/2007   . DEGENERATIVE JOINT DISEASE 09/27/2006  . OSTEOARTHRITIS, HAND 12/16/2008  . SPINAL STENOSIS 11/01/2006  . LIVER FUNCTION TESTS, ABNORMAL 01/22/2009  . UNS ADVRS EFF UNS RX MEDICINAL&BIOLOGICAL SBSTNC 01/30/2007  . Personal history of malignant neoplasm of breast 07/24/2008  . NEPHROLITHIASIS, HX OF 02/27/2010  . Vitreous detachment   . Shingles     recurrent  . Head trauma     closed  . Personal history of urinary calculi   . Post-traumatic wound infection 12/01/2010    Mild streaking superiorly and on one day of septra  Will give rocephin and add keflex  adn follow up    Past Surgical History  Procedure Laterality Date  . Lumbar laminectomy    . Cataract extraction    . Abdominal hysterectomy    . Breast lumpectomy    . Tonsillectomy    . Coronary angioplasty with stent placement      bilat iliac stents, left renal artery and rt coronary artery last myoview 8/09 with EF 70%  . Skin cancer excision    . Breast lumpectomy  1998   Current Outpatient Prescriptions  Medication Sig Dispense Refill  . amLODipine (NORVASC) 10 MG tablet Take 1 tablet (10 mg total) by mouth daily.  90 tablet  4  . aspirin 81 MG EC tablet Take 81 mg by mouth daily.        . Cholecalciferol (VITAMIN D) 1000 UNITS capsule Take 1,000 Units by mouth daily.        . fish oil-omega-3 fatty acids 1000 MG capsule Take 1 g by mouth daily.       Marland Kitchen  isosorbide mononitrate (IMDUR) 30 MG 24 hr tablet Take 1 tablet (30 mg total) by mouth daily.  90 tablet  3  . nitroGLYCERIN (NITROSTAT) 0.4 MG SL tablet Place 1 tablet (0.4 mg total) under the tongue every 5 (five) minutes as needed.  25 tablet  3  . tiotropium (SPIRIVA) 18 MCG inhalation capsule Place 1 capsule (18 mcg total) into inhaler and inhale daily.  30 capsule  6  . traMADol (ULTRAM) 50 MG tablet TAKE ONE TABLET BY MOUTH EVERY EIGHT HOURS AS NEEDED FOR PAIN  40 tablet  0  . XOPENEX HFA 45 MCG/ACT inhaler INHALE TWO PUFFS BY MOUTH EVERY SIX HOURS AS NEEDED  1  Inhaler  3   No current facility-administered medications for this visit.   Allergies  Allergen Reactions  . Oxycodone   . Statins   . Tequin      Review of Systems  Constitutional: Positive for fatigue.  Respiratory: Positive for shortness of breath and wheezing.   Cardiovascular: Positive for chest pain.       Objective:   Physical Exam BP 118/62  Pulse 76  Temp(Src) 98.6 F (37 C) (Temporal)  Resp 15  Ht 5\' 4"  (1.626 m)  Wt 101 lb 6.4 oz (45.995 kg)  BMI 17.4 kg/m2 General: Very thin somewhat chronically ill-appearing Caucasian female Skin: No rash infection HEENT: No papillary masses or thyromegaly Lymph nodes: No cervical, supraclavicular or axillary nodes palpable Lungs: Bilateral mild to moderate wheezes. Breath sounds equal. Chest wall: There is some moderate tenderness along the anterior rib cage above the breast and along the lateral rib cage around the axilla and lateral to the breast. Breasts: Lumpectomy site in the at the upper right breast with slight depression. Just on the lateral edge of the lumpectomy site is a small superficial nodule less than 1 cm which seems to be very tender, more so than her ribs. This almost feels more like a surface irregularity than any worrisome mass. No other palpable abnormalities in either breast.    Assessment:     Right breast/chest wall pain. I'm not sure of the etiology. However the most tender site on exam appears to be just adjacent to her lumpectomy site where there is a very small superficial nodule. This does not feel like a malignancy and I'm not really worried about recurrent cancer. She may have an area of scar or possibly a neuroma in her lumpectomy site that is causing pain. She is also somewhat tender over her ribs and I cannot rule out chest wall pain but she certainly appears to be more tender in the breast. I don't noted any further imaging or workup would be helpful. The only thing I could suggest if she wanted  to do this would be to excise the lumpectomy scar and the tender nodule to see if we can get relief. I think the results would be a little uncertain. I discussed this with her. She feels that she would like to hold off on any surgical intervention at this point. She may want to consult with her pulmonary physician. I will plan to see her back in one month for followup and we can consider excision if her pain persists. She is comfortable with this plan.    Plan:     Return to office in one month for followup.

## 2012-09-12 ENCOUNTER — Encounter (INDEPENDENT_AMBULATORY_CARE_PROVIDER_SITE_OTHER): Payer: Self-pay

## 2012-09-12 ENCOUNTER — Ambulatory Visit (INDEPENDENT_AMBULATORY_CARE_PROVIDER_SITE_OTHER)
Admission: RE | Admit: 2012-09-12 | Discharge: 2012-09-12 | Disposition: A | Discharge status: Home / Self Care | Payer: Medicare Other | Source: Ambulatory Visit | Attending: Internal Medicine | Admitting: Internal Medicine

## 2012-09-12 DIAGNOSIS — Z13820 Encounter for screening for osteoporosis: Secondary | ICD-10-CM

## 2012-09-13 ENCOUNTER — Encounter: Payer: Self-pay | Admitting: Internal Medicine

## 2012-09-13 ENCOUNTER — Ambulatory Visit (INDEPENDENT_AMBULATORY_CARE_PROVIDER_SITE_OTHER): Payer: Medicare Other | Admitting: General Surgery

## 2012-10-12 ENCOUNTER — Encounter (INDEPENDENT_AMBULATORY_CARE_PROVIDER_SITE_OTHER): Payer: Medicare Other

## 2012-10-12 DIAGNOSIS — R0989 Other specified symptoms and signs involving the circulatory and respiratory systems: Secondary | ICD-10-CM

## 2012-10-12 DIAGNOSIS — I2510 Atherosclerotic heart disease of native coronary artery without angina pectoris: Secondary | ICD-10-CM

## 2012-10-12 DIAGNOSIS — I6529 Occlusion and stenosis of unspecified carotid artery: Secondary | ICD-10-CM

## 2012-10-12 DIAGNOSIS — I679 Cerebrovascular disease, unspecified: Secondary | ICD-10-CM

## 2012-10-13 ENCOUNTER — Encounter (INDEPENDENT_AMBULATORY_CARE_PROVIDER_SITE_OTHER): Payer: Medicare Other | Admitting: General Surgery

## 2012-10-24 ENCOUNTER — Encounter: Payer: Self-pay | Admitting: Pulmonary Disease

## 2012-10-24 ENCOUNTER — Ambulatory Visit (INDEPENDENT_AMBULATORY_CARE_PROVIDER_SITE_OTHER): Payer: Medicare Other | Admitting: Pulmonary Disease

## 2012-10-24 VITALS — BP 140/70 | HR 80 | Temp 97.7°F | Ht 64.0 in | Wt 99.0 lb

## 2012-10-24 DIAGNOSIS — Z23 Encounter for immunization: Secondary | ICD-10-CM

## 2012-10-24 DIAGNOSIS — J449 Chronic obstructive pulmonary disease, unspecified: Secondary | ICD-10-CM

## 2012-10-24 MED ORDER — TIOTROPIUM BROMIDE MONOHYDRATE 18 MCG IN CAPS: 18.0000 ug | ORAL_CAPSULE | Freq: Every day | RESPIRATORY_TRACT | Status: DC

## 2012-10-24 MED ORDER — LEVALBUTEROL TARTRATE 45 MCG/ACT IN AERO: 2.0000 | INHALATION_SPRAY | Freq: Four times a day (QID) | RESPIRATORY_TRACT | Status: DC | PRN

## 2012-10-24 NOTE — Progress Notes (Signed)
  Subjective:    Patient ID: [name] [name], female    DOB: Oct 04, 1927, 79 y.o.   MRN: [id number]  HPI  PCP - [name]  84/F,ex - smoker, with CAD, PVD, gold stg 2 COPD & recurrent LLL atelectasis  6/12 >>Spirometry >>FEV1 61%  She quit smoking in 5/12  She was first admitted 6/12 for hypoxia & LLL atelectasis.  Ct chest showed Volume loss in the inferior left hemithorax with filling defect of the left lower and less so left upper lobe   Bronchoscopy showed LLL endobronchial narrowing, without discrete mass. Papule on rt mainstem bronchus was brushed - cytology all neg. BAL cx, afb, fungal neg  Re- admitted 8/19-8/23/12 for pneumonia & LLL atx. Rpt bscopy >>thick mucoid secretions with difficulty passing scope. Cytology was neg for malignancy . Path showed AMYLOID DEPOSITION AND DYSTROPHIC CALCIFICATIONS.  CT sinuses 8/12 neg   CT 5/13.>>No residual lymphadenopathy. No evidence of metastatic breast cancer. Stable chronic lung disease with biapical scarring, diffuse calcifications of the tracheobronchial tree and mucous impaction of the lingular bronchus  Dulera caused her mouth irritation & shakes   Flare in dec '13 - >>augmentin and steroid taper  05/08/12 Acute OV >>augmentin /pred taper      10/24/2012 78m FU Developed chest pain - XR ribs neg, bone scan neg - on spiriva  Breathing is unchanged. Reports slight SOB. Denies chest tightness or wheezing.  Tearful - daughter was found dead last week - dysfunctional family Used conversation to discuss HCPOA - she has designated son in [place] Has living will & DNR document She lives alone & is very functional   Review of Systems neg for any significant sore throat, dysphagia, itching, sneezing, nasal congestion or excess/ purulent secretions, fever, chills, sweats, unintended wt loss, pleuritic or exertional cp, hempoptysis, orthopnea pnd or change in chronic leg swelling. Also denies presyncope, palpitations, heartburn, abdominal pain,  nausea, vomiting, diarrhea or change in bowel or urinary habits, dysuria,hematuria, rash, arthralgias, visual complaints, headache, numbness weakness or ataxia.     Objective:   Physical Exam  Gen. Pleasant, thin woman, in no distress ENT - no lesions, no post nasal drip Neck: No JVD, no thyromegaly, no carotid bruits Lungs: no use of accessory muscles, no dullness to percussion, clear without rales or rhonchi  Cardiovascular: Rhythm regular, heart sounds  normal, no murmurs or gallops, no peripheral edema Musculoskeletal: No deformities, no cyanosis or clubbing        Assessment & Plan:

## 2012-10-24 NOTE — Patient Instructions (Addendum)
Flu shot

## 2012-10-25 ENCOUNTER — Telehealth: Payer: Self-pay | Admitting: Internal Medicine

## 2012-10-25 NOTE — Assessment & Plan Note (Signed)
Used conversation to discuss HCPOA - she has designated son in [place] Has living will & DNR document  FLu shot Ct spiriva

## 2012-10-25 NOTE — Telephone Encounter (Signed)
Pt called to cancel her fu for next week, she states that her daughter was found dead yesterday. FYI.

## 2012-10-26 ENCOUNTER — Telehealth: Payer: Self-pay | Admitting: Cardiovascular Disease

## 2012-10-26 ENCOUNTER — Telehealth (INDEPENDENT_AMBULATORY_CARE_PROVIDER_SITE_OTHER): Payer: Self-pay

## 2012-10-26 NOTE — Telephone Encounter (Signed)
ERROR

## 2012-10-26 NOTE — Telephone Encounter (Signed)
Tell patient I am very sorry . Please Let us know any way we can help.

## 2012-10-26 NOTE — Telephone Encounter (Signed)
Pt calling to cancel the appt scheduled with Dr Johna Sheriff on 11/12/12 due to a death in the family. The pt is not  having the breast pain as of week a go. The pt is not sure if this has just gone a way or is it because she has been taking Tramadol for spine pain given by Dr Ethelene Hal. The pt has a lot going on with the death of her daughter so she will call back to schedule the f/u with Dr Johna Sheriff.

## 2012-10-27 ENCOUNTER — Encounter (INDEPENDENT_AMBULATORY_CARE_PROVIDER_SITE_OTHER): Payer: Medicare Other | Admitting: General Surgery

## 2012-10-27 NOTE — Telephone Encounter (Signed)
Do we have cards that we send the patient?

## 2012-10-30 ENCOUNTER — Ambulatory Visit: Payer: Medicare Other | Admitting: Internal Medicine

## 2012-11-01 ENCOUNTER — Ambulatory Visit: Payer: Medicare Other | Admitting: Cardiovascular Disease

## 2012-11-09 ENCOUNTER — Ambulatory Visit (INDEPENDENT_AMBULATORY_CARE_PROVIDER_SITE_OTHER): Payer: Medicare Other | Admitting: Internal Medicine

## 2012-11-09 ENCOUNTER — Encounter: Payer: Self-pay | Admitting: Internal Medicine

## 2012-11-09 VITALS — BP 150/60 | HR 60 | Temp 98.0°F | Wt 99.0 lb

## 2012-11-09 DIAGNOSIS — E785 Hyperlipidemia, unspecified: Secondary | ICD-10-CM

## 2012-11-09 DIAGNOSIS — R21 Rash and other nonspecific skin eruption: Secondary | ICD-10-CM

## 2012-11-09 DIAGNOSIS — M4800 Spinal stenosis, site unspecified: Secondary | ICD-10-CM

## 2012-11-09 DIAGNOSIS — N644 Mastodynia: Secondary | ICD-10-CM

## 2012-11-09 DIAGNOSIS — Z853 Personal history of malignant neoplasm of breast: Secondary | ICD-10-CM

## 2012-11-09 DIAGNOSIS — Z634 Disappearance and death of family member: Secondary | ICD-10-CM | POA: Insufficient documentation

## 2012-11-09 DIAGNOSIS — I10 Essential (primary) hypertension: Secondary | ICD-10-CM

## 2012-11-09 DIAGNOSIS — R636 Underweight: Secondary | ICD-10-CM

## 2012-11-09 NOTE — Progress Notes (Signed)
Chief Complaint  Patient presents with  . Follow-up    Has a wound on her bottom that she thinks is shingles.  It itches and hurts.  Has used different ointments.    HPI: Patient comes in for followup had to be rescheduled because Daughter found dead  At home.  Dead for  7 days. She's coping second daughter who has died. Ramos:   Has severe arthritis in the spine but is coping well takes tramadol Bad arthritis:    Tramadol bid  Helps.  Alva:   No change pulmonary status. Cooper: changed appt.  No change  Arteries. And neck were about the same and is on a 2 year followup. Rash on left buttocks area recurs only under severe stress wonders if it's shingles stings and now it itches using over-the-counter topical and fading always comes back in the same place no history of herpes simplex or cold sores. Right breast pain comes and goes had to reschedule that appointment for another exam by Dr. Lendon Colonel worth. Isn't as bad feels nothing new  Continues to be independent although has pain works in the garden very active around the house keeps it tight he. ROS: See pertinent positives and negatives per HPI.  Past Medical History  Diagnosis Date  . POSTHERPETIC NEURALGIA 08/25/2006  . CARCINOMA, BASAL CELL 02/13/2006  . NEOPLASM, SKIN, UNCERTAIN BEHAVIOR 12/16/2008  . HYPOTHYROIDISM 01/30/2007  . Unspecified vitamin D deficiency 02/07/2007  . HYPERLIPIDEMIA 03/10/2008  . UNSPECIFIED ANEMIA 03/18/2008  . THROMBOCYTOPENIA 07/30/2008  . LOSS, HEARING NOS 08/25/2006  . HYPERTENSION 02/13/2006  . UNSTABLE ANGINA 12/12/2009  . CORONARY ARTERY DISEASE 02/13/2006  . CAROTID ARTERY DISEASE 10/03/2006  . RENAL ARTERY STENOSIS 08/01/2008  . PERIPHERAL VASCULAR DISEASE 02/13/2006  . Chronic rhinitis 12/13/2006  . CHRONIC OBSTRUCTIVE PULMONARY DISEASE, ACUTE EXACERBATION 11/17/2009  . DERMATITIS 09/25/2007  . DEGENERATIVE JOINT DISEASE 09/27/2006  . OSTEOARTHRITIS, HAND 12/16/2008  . SPINAL STENOSIS 11/01/2006  . LIVER  FUNCTION TESTS, ABNORMAL 01/22/2009  . UNS ADVRS EFF UNS RX MEDICINAL&BIOLOGICAL SBSTNC 01/30/2007  . Personal history of malignant neoplasm of breast 07/24/2008  . NEPHROLITHIASIS, HX OF 02/27/2010  . Vitreous detachment   . Shingles     recurrent  . Head trauma     closed  . Personal history of urinary calculi   . Post-traumatic wound infection 12/01/2010    Mild streaking superiorly and on one day of septra  Will give rocephin and add keflex  adn follow up     Family History  Problem Relation Age of Onset  . Breast cancer Daughter   . Tuberculosis Father     History   Social History  . Marital Status: Widowed    Spouse Name: N/A    Number of Children: N/A  . Years of Education: N/A   Social History Main Topics  . Smoking status: Former Smoker -- 1.00 packs/day for 30 years    Types: Cigarettes    Quit date: 06/15/2010  . Smokeless tobacco: Never Used     Comment: 2-3 cigs per week/// 06/30/12  . Alcohol Use: Yes     Comment: socially  . Drug Use: No  . Sexual Activity: None   Other Topics Concern  . None   Social History Narrative   Widowed   5 hours of sleep   Has friends checking on her   No Pets    Outpatient Encounter Prescriptions as of 11/09/2012  Medication Sig Dispense Refill  . amLODipine (NORVASC) 10 MG  tablet Take 1 tablet (10 mg total) by mouth daily.  90 tablet  4  . aspirin 81 MG EC tablet Take 81 mg by mouth daily.        . Cholecalciferol (VITAMIN D) 1000 UNITS capsule Take 1,000 Units by mouth daily.        . fish oil-omega-3 fatty acids 1000 MG capsule Take 1 g by mouth daily.       . isosorbide mononitrate (IMDUR) 30 MG 24 hr tablet Take 1 tablet (30 mg total) by mouth daily.  90 tablet  3  . levalbuterol (XOPENEX HFA) 45 MCG/ACT inhaler Inhale 2 puffs into the lungs every 6 (six) hours as needed for wheezing.  1 Inhaler  3  . nitroGLYCERIN (NITROSTAT) 0.4 MG SL tablet Place 1 tablet (0.4 mg total) under the tongue every 5 (five) minutes as  needed.  25 tablet  3  . tiotropium (SPIRIVA) 18 MCG inhalation capsule Place 1 capsule (18 mcg total) into inhaler and inhale daily.  30 capsule  6  . traMADol (ULTRAM) 50 MG tablet TAKE ONE TABLET BY MOUTH EVERY EIGHT HOURS AS NEEDED FOR PAIN  40 tablet  0   No facility-administered encounter medications on file as of 11/09/2012.    EXAM:  BP 150/60  Pulse 60  Temp(Src) 98 F (36.7 C) (Oral)  Wt 99 lb (44.906 kg)  BMI 16.98 kg/m2  SpO2 97%  Body mass index is 16.98 kg/(m^2).  GENERAL: vitals reviewed and listed above, alert, oriented, appears well hydrated and in no acute distress slender but active antalgic gait but otherwise steady does hold on cognitively intact  HEENT: atraumatic, conjunctiva  clear, no obvious abnormalities on inspection of external nose and ears  NECK: no obvious masses on inspection palpation  LUNGS: clear to auscultation bilaterally, decreased breath sounds CV: HRRR, no clubbing cyanosis or  peripheral edema no gallops or murmurs Skin left gluteal fold buttock small patch of faded red scaly rash no vesicles noted MS: moves all extremities  Skin : left buttiock 1-2 cm  cdrusted  healing patch  PSYCH: pleasant and cooperative, no obvious depression or anxiety Lab Results  Component Value Date   WBC 6.2 04/21/2012   HGB 14.1 04/21/2012   HCT 42.2 04/21/2012   PLT 146.0* 04/21/2012   GLUCOSE 109* 08/15/2012   CHOL 159 08/30/2011   TRIG 53.0 08/30/2011   HDL 65.00 08/30/2011   LDLCALC 83 08/30/2011   ALT 20 04/21/2012   AST 29 04/21/2012   NA 140 08/15/2012   K 3.6 08/15/2012   CL 106 08/15/2012   CREATININE 0.9 08/15/2012   BUN 20 08/15/2012   CO2 27 08/15/2012   TSH 1.82 04/21/2012   INR 1.04 09/20/2010   HGBA1C 6.0 08/15/2012   Bone density report given T. scores are all within normal range although possibly inaccurate because of severe arthritis in the spine but hips are normal. ASSESSMENT AND PLAN:  Discussed the following assessment and  plan:  Underweight  SPINAL STENOSIS  HYPERTENSION  Pain of right breast  HYPERLIPIDEMIA  HX: breast cancer  Loss or death of family member - daughter  unkown. cause  Rash and nonspecific skin eruption - recurrs same place under severe stress poss hsv  fading.  condolences  Disc fall avoidance  Etc  Fy exam breast may not be  clinically serious  Rash still acts like rarely recurrent HSV but almost gone  -Patient advised to return or notify health care team  if symptoms  worsen or persist or new concerns arise.  Patient Instructions  Fu with dr Excell Seltzer   Due for lipid panel in future  Bone density is ok.   The skin lesion looks like could be a  Herpetic lesion .  Should resolve with time . If recurrnet then plan valtrex  Pill.     Advise fu appt with dr Johna Sheriff  For exam.  Fall prevention to continue.     Neta Mends. Eliyah Bazzi M.D.  Total visit > 50% spent counseling and coordinating care

## 2012-11-09 NOTE — Patient Instructions (Signed)
Fu with dr Excell Seltzer   Due for lipid panel in future  Bone density is ok.   The skin lesion looks like could be a  Herpetic lesion .  Should resolve with time . If recurrnet then plan valtrex  Pill.     Advise fu appt with dr Johna Sheriff  For exam.  Fall prevention to continue.

## 2012-11-12 ENCOUNTER — Other Ambulatory Visit: Payer: Self-pay | Admitting: Cardiology

## 2012-11-13 ENCOUNTER — Other Ambulatory Visit: Payer: Self-pay | Admitting: *Deleted

## 2012-11-13 DIAGNOSIS — I2510 Atherosclerotic heart disease of native coronary artery without angina pectoris: Secondary | ICD-10-CM

## 2012-11-13 MED ORDER — AMLODIPINE BESYLATE 10 MG PO TABS: 10.0000 mg | ORAL_TABLET | Freq: Every day | ORAL | Status: DC

## 2012-11-29 ENCOUNTER — Ambulatory Visit (INDEPENDENT_AMBULATORY_CARE_PROVIDER_SITE_OTHER): Payer: Medicare Other | Admitting: General Surgery

## 2012-11-29 ENCOUNTER — Encounter (INDEPENDENT_AMBULATORY_CARE_PROVIDER_SITE_OTHER): Payer: Self-pay | Admitting: General Surgery

## 2012-11-29 VITALS — BP 112/76 | HR 64 | Temp 97.5°F | Resp 16 | Ht 64.0 in | Wt 101.8 lb

## 2012-11-29 DIAGNOSIS — R0789 Other chest pain: Secondary | ICD-10-CM

## 2012-11-29 DIAGNOSIS — R071 Chest pain on breathing: Secondary | ICD-10-CM

## 2012-11-29 NOTE — Progress Notes (Signed)
History: Patient returns for followup of her right breast or chest wall pain. Diagnosis was uncertain that her last visit. Happily she reports that this pain has resolved.  Exam: Breast exam remains unremarkable with no evidence of recurrent cancer. Chest wall is nontender.  Assessment and plan: Breast or chest wall pain. I suspect this might have been just wall pain with costochondritis or costochondral separation. Happily it is resolved. No evidence of recurrent breast cancer. Return as needed.

## 2013-01-23 ENCOUNTER — Encounter: Payer: Self-pay | Admitting: Cardiovascular Disease

## 2013-01-23 ENCOUNTER — Ambulatory Visit (INDEPENDENT_AMBULATORY_CARE_PROVIDER_SITE_OTHER): Payer: Medicare Other | Admitting: Cardiovascular Disease

## 2013-01-23 VITALS — BP 158/60 | HR 67 | Ht 64.0 in | Wt 106.0 lb

## 2013-01-23 DIAGNOSIS — I739 Peripheral vascular disease, unspecified: Secondary | ICD-10-CM

## 2013-01-23 NOTE — Patient Instructions (Signed)
Your physician wants you to follow-up in: 1 year with Dr. Excell Seltzer.  You will receive a reminder letter in the mail two months in advance. If you don't receive a letter, please call our office to schedule the follow-up appointment.  Your physician has requested that you have an ankle brachial index (ABI) in April 2015.  You will be scheduled for this test before you leave today.

## 2013-01-25 ENCOUNTER — Encounter: Payer: Self-pay | Admitting: Cardiovascular Disease

## 2013-01-25 NOTE — Progress Notes (Signed)
HPI:  79 year-old woman presenting for follow-up of PAD. The patient also has coronary artery disease with history of right coronary artery stenting in 2005. She is followed by Dr. Jens Som for cardiac issues.   She's had a difficult time of late. Her daughter was found dead in her home several months ago. From a symptomatic perspective she reports no cardiac problems. She complains of generalized fatigue but denies chest pain or pressure. She has some leg pain, but nothing associated with walking. She is able to work in her yard without limitation, but she's inactive over the Winter months.    Outpatient Encounter Prescriptions as of 01/23/2013  Medication Sig  . amLODipine (NORVASC) 10 MG tablet Take 1 tablet (10 mg total) by mouth daily.  Marland Kitchen aspirin 81 MG EC tablet Take 81 mg by mouth daily.    . Cholecalciferol (VITAMIN D) 1000 UNITS capsule Take 1,000 Units by mouth daily.    . fish oil-omega-3 fatty acids 1000 MG capsule Take 1 g by mouth daily.   . isosorbide mononitrate (IMDUR) 30 MG 24 hr tablet Take 1 tablet (30 mg total) by mouth daily.  Marland Kitchen levalbuterol (XOPENEX HFA) 45 MCG/ACT inhaler Inhale 2 puffs into the lungs every 6 (six) hours as needed for wheezing.  . nitroGLYCERIN (NITROSTAT) 0.4 MG SL tablet Place 1 tablet (0.4 mg total) under the tongue every 5 (five) minutes as needed.  . tiotropium (SPIRIVA) 18 MCG inhalation capsule Place 1 capsule (18 mcg total) into inhaler and inhale daily.  . traMADol (ULTRAM) 50 MG tablet TAKE ONE TABLET BY MOUTH EVERY EIGHT HOURS AS NEEDED FOR PAIN    Allergies  Allergen Reactions  . Oxycodone   . Statins   . Tequin     Past Medical History  Diagnosis Date  . POSTHERPETIC NEURALGIA 08/25/2006  . CARCINOMA, BASAL CELL 02/13/2006  . NEOPLASM, SKIN, UNCERTAIN BEHAVIOR 12/16/2008  . HYPOTHYROIDISM 01/30/2007  . Unspecified vitamin D deficiency 02/07/2007  . HYPERLIPIDEMIA 03/10/2008  . UNSPECIFIED ANEMIA 03/18/2008  . THROMBOCYTOPENIA  07/30/2008  . LOSS, HEARING NOS 08/25/2006  . HYPERTENSION 02/13/2006  . UNSTABLE ANGINA 12/12/2009  . CORONARY ARTERY DISEASE 02/13/2006  . CAROTID ARTERY DISEASE 10/03/2006  . RENAL ARTERY STENOSIS 08/01/2008  . PERIPHERAL VASCULAR DISEASE 02/13/2006  . Chronic rhinitis 12/13/2006  . CHRONIC OBSTRUCTIVE PULMONARY DISEASE, ACUTE EXACERBATION 11/17/2009  . DERMATITIS 09/25/2007  . DEGENERATIVE JOINT DISEASE 09/27/2006  . OSTEOARTHRITIS, HAND 12/16/2008  . SPINAL STENOSIS 11/01/2006  . LIVER FUNCTION TESTS, ABNORMAL 01/22/2009  . UNS ADVRS EFF UNS RX MEDICINAL&BIOLOGICAL SBSTNC 01/30/2007  . Personal history of malignant neoplasm of breast 07/24/2008  . NEPHROLITHIASIS, HX OF 02/27/2010  . Vitreous detachment   . Shingles     recurrent  . Head trauma     closed  . Personal history of urinary calculi   . Post-traumatic wound infection 12/01/2010    Mild streaking superiorly and on one day of septra  Will give rocephin and add keflex  adn follow up     ROS: Negative except as per HPI  BP 158/60  Pulse 67  Ht 5\' 4"  (1.626 m)  Wt 106 lb (48.081 kg)  BMI 18.19 kg/m2  PHYSICAL EXAM: Pt is alert and oriented, thin elderly woman in NAD HEENT: normal Neck: JVP - normal, carotids 2+= with a right carotid bruit Lungs: CTA bilaterally with prolonged expiration CV: RRR without murmur or gallop Abd: soft, NT, Positive BS, no hepatomegaly Ext: no C/C/E, thin Skin: warm/dry  no rash  EKG:  NSR with anteroseptal infarct pattern.   Aortoiliac duplex: heavy calcification but no significant stenoses identified bilaterally.  ABI's: 0.84 on the right and 0.69 on the left  ASSESSMENT AND PLAN: 1. Lower extremity PAD with intermittent claudication. Stable at present without significant limitation. Continue same med Rx's. Vascular studies outlined above - no significant changes form previous studies.   2. CAD - no angina. Management per Dr Jens Som.   3. Tobacco - complete cessation advised.    Tonny Bollman 01/25/2013 10:47 PM

## 2013-03-10 ENCOUNTER — Telehealth: Payer: Self-pay | Admitting: Pulmonary Disease

## 2013-03-10 NOTE — Telephone Encounter (Signed)
[name] would like her to try albuterol MDI instead of xopenex OK with me

## 2013-03-13 MED ORDER — ALBUTEROL SULFATE HFA 108 (90 BASE) MCG/ACT IN AERS: 2.0000 | INHALATION_SPRAY | Freq: Four times a day (QID) | RESPIRATORY_TRACT | Status: DC | PRN

## 2013-03-13 NOTE — Telephone Encounter (Signed)
RX has been sent. Nothing further needed 

## 2013-04-02 ENCOUNTER — Ambulatory Visit (INDEPENDENT_AMBULATORY_CARE_PROVIDER_SITE_OTHER): Payer: Medicare HMO | Admitting: Pulmonary Disease

## 2013-04-02 ENCOUNTER — Encounter: Payer: Self-pay | Admitting: Pulmonary Disease

## 2013-04-02 VITALS — BP 128/84 | HR 73 | Ht 64.0 in | Wt 107.8 lb

## 2013-04-02 DIAGNOSIS — J449 Chronic obstructive pulmonary disease, unspecified: Secondary | ICD-10-CM

## 2013-04-02 NOTE — Progress Notes (Signed)
   Subjective:    Patient ID: [name] [name], female    DOB: 1927/12/10, 80 y.o.   MRN: [id number]  HPI PCP - [name]   [name],ex - smoker, with CAD, PVD, gold stg 2 COPD & recurrent LLL atelectasis  6/12 >>Spirometry >>FEV1 61%  She quit smoking in 06/2010  She was first admitted 6/12 for hypoxia & LLL atelectasis.  Ct chest showed Volume loss in the inferior left hemithorax with filling defect of the left lower and less so left upper lobe  Bronchoscopy showed LLL endobronchial narrowing, without discrete mass. Papule on rt mainstem bronchus was brushed - cytology all neg. BAL cx, afb, fungal neg  Re- admitted 09/2010 for pneumonia & LLL atx. Rpt bscopy >>thick mucoid secretions with difficulty passing scope. Cytology was neg for malignancy . Path showed AMYLOID DEPOSITION AND DYSTROPHIC CALCIFICATIONS.  CT sinuses 8/12 neg  CT 5/13.>>No residual lymphadenopathy. No evidence of metastatic breast cancer. Stable chronic lung disease with biapical scarring, diffuse calcifications of the tracheobronchial tree and mucous impaction of the lingular bronchus  Dulera caused her mouth irritation & shakes  Flare in dec '13 - >>augmentin and steroid taper  05/08/12 Acute OV >>augmentin /pred taper   - she has designated son in [place] as [place] Has living will & DNR document  She lives alone & is very functional    04/02/2013  Chief Complaint  Patient presents with  . Follow-up    Reports breathing is doing well and has no concerns today    [name] would like her to try albuterol MDI instead of xopenex - but she developed tremors with albuterol She has sorted out dysfunctional family issues after daughter's death - on spiriva  Breathing is unchanged. Reports slight SOB. Denies chest tightness or wheezing.     Review of Systems neg for any significant sore throat, dysphagia, itching, sneezing, nasal congestion or excess/ purulent secretions, fever, chills, sweats, unintended wt loss, pleuritic  or exertional cp, hempoptysis, orthopnea pnd or change in chronic leg swelling. Also denies presyncope, palpitations, heartburn, abdominal pain, nausea, vomiting, diarrhea or change in bowel or urinary habits, dysuria,hematuria, rash, arthralgias, visual complaints, headache, numbness weakness or ataxia.     Objective:   Physical Exam  Gen. Pleasant, thin woman, in no distress ENT - no lesions, no post nasal drip Neck: No JVD, no thyromegaly, no carotid bruits Lungs: no use of accessory muscles, no dullness to percussion, decreased without rales or rhonchi  Cardiovascular: Rhythm regular, heart sounds  normal, no murmurs or gallops, no peripheral edema Musculoskeletal: No deformities, no cyanosis or clubbing        Assessment & Plan:

## 2013-04-02 NOTE — Patient Instructions (Signed)
We will try to get xopenex approved Stay on spiriva

## 2013-04-09 NOTE — Assessment & Plan Note (Signed)
We will try to get xopenex approved (PA) -albuterol causes tremors Stay on spiriva

## 2013-04-30 ENCOUNTER — Other Ambulatory Visit (HOSPITAL_COMMUNITY): Payer: Self-pay | Admitting: Cardiology

## 2013-04-30 DIAGNOSIS — I739 Peripheral vascular disease, unspecified: Secondary | ICD-10-CM

## 2013-04-30 DIAGNOSIS — L98499 Non-pressure chronic ulcer of skin of other sites with unspecified severity: Principal | ICD-10-CM

## 2013-05-07 ENCOUNTER — Encounter (HOSPITAL_COMMUNITY): Payer: Medicare Other

## 2013-05-07 ENCOUNTER — Encounter (HOSPITAL_COMMUNITY): Payer: Medicare HMO

## 2013-05-07 ENCOUNTER — Ambulatory Visit (HOSPITAL_COMMUNITY): Payer: Medicare HMO | Attending: Internal Medicine | Admitting: Cardiology

## 2013-05-07 DIAGNOSIS — I739 Peripheral vascular disease, unspecified: Secondary | ICD-10-CM | POA: Insufficient documentation

## 2013-05-07 DIAGNOSIS — L98499 Non-pressure chronic ulcer of skin of other sites with unspecified severity: Principal | ICD-10-CM | POA: Insufficient documentation

## 2013-05-07 DIAGNOSIS — I700 Atherosclerosis of aorta: Secondary | ICD-10-CM | POA: Insufficient documentation

## 2013-05-07 NOTE — Progress Notes (Signed)
Aorto-iliac duplex performed 

## 2013-05-07 NOTE — Progress Notes (Signed)
ABI performed  

## 2013-05-09 ENCOUNTER — Ambulatory Visit (INDEPENDENT_AMBULATORY_CARE_PROVIDER_SITE_OTHER): Payer: Medicare HMO | Admitting: Internal Medicine

## 2013-05-09 ENCOUNTER — Encounter: Payer: Self-pay | Admitting: Internal Medicine

## 2013-05-09 VITALS — BP 164/64 | HR 67 | Temp 98.1°F | Ht 63.5 in | Wt 105.0 lb

## 2013-05-09 DIAGNOSIS — M4800 Spinal stenosis, site unspecified: Secondary | ICD-10-CM

## 2013-05-09 DIAGNOSIS — S81812A Laceration without foreign body, left lower leg, initial encounter: Secondary | ICD-10-CM

## 2013-05-09 DIAGNOSIS — S81809A Unspecified open wound, unspecified lower leg, initial encounter: Secondary | ICD-10-CM

## 2013-05-09 DIAGNOSIS — I10 Essential (primary) hypertension: Secondary | ICD-10-CM

## 2013-05-09 DIAGNOSIS — J449 Chronic obstructive pulmonary disease, unspecified: Secondary | ICD-10-CM

## 2013-05-09 DIAGNOSIS — S91009A Unspecified open wound, unspecified ankle, initial encounter: Secondary | ICD-10-CM

## 2013-05-09 DIAGNOSIS — S81009A Unspecified open wound, unspecified knee, initial encounter: Secondary | ICD-10-CM

## 2013-05-09 DIAGNOSIS — I739 Peripheral vascular disease, unspecified: Secondary | ICD-10-CM

## 2013-05-09 NOTE — Progress Notes (Signed)
Chief Complaint  Patient presents with  . Follow-up    Has been using tramadol more often due to worsening back pain.    HPI: [name] [name]  comes in today for follow up of  multiple medical problems.   pvd with intermittent claudication  Legs ar limiting .  Has seen vascular cardiology recently no change.   Cant walk  around store without cart. Denies recent falling  Doesn't  Have grab bars in bathroom. Has limited resources with fixed income. But states the cheek healthy.  Copd:  Recent.  Taking the 5th. In regard to tobacco status  Back:  Using more recently. 1/2 pull tramadol. Prescribed by Dr. Nelva Bush  Hit her left lower leg on something when she was out gardening one to 2 days ago covered it with Steri-Strips no excess pain fever streaking but some bruising above the area.  Feel she has arthritis in both toes went to podiatrist in the past who cut her nails and injured her toe we'll go back. There is no swelling or redness at this time.  Asks to renew form for handicap sticker ROS: See pertinent positives and negatives per HPI. No current chest pain denies increasing shortness of breath rib pain got better felt it was pectoral muscle strain having worked in the garden. No new breast symptoms  Past Medical History  Diagnosis Date  . POSTHERPETIC NEURALGIA 08/25/2006  . CARCINOMA, BASAL CELL 02/13/2006  . NEOPLASM, SKIN, UNCERTAIN BEHAVIOR 82/95/6213  . HYPOTHYROIDISM 01/30/2007  . Unspecified vitamin D deficiency 02/07/2007  . HYPERLIPIDEMIA 03/10/2008  . UNSPECIFIED ANEMIA 03/18/2008  . THROMBOCYTOPENIA 07/30/2008  . LOSS, HEARING NOS 08/25/2006  . HYPERTENSION 02/13/2006  . UNSTABLE ANGINA 12/12/2009  . CORONARY ARTERY DISEASE 02/13/2006  . CAROTID ARTERY DISEASE 10/03/2006  . RENAL ARTERY STENOSIS 08/01/2008  . PERIPHERAL VASCULAR DISEASE 02/13/2006  . Chronic rhinitis 12/13/2006  . CHRONIC OBSTRUCTIVE PULMONARY DISEASE, ACUTE EXACERBATION 11/17/2009  . DERMATITIS  09/25/2007  . DEGENERATIVE JOINT DISEASE 09/27/2006  . OSTEOARTHRITIS, HAND 12/16/2008  . SPINAL STENOSIS 11/01/2006  . LIVER FUNCTION TESTS, ABNORMAL 01/22/2009  . UNS ADVRS EFF UNS RX MEDICINAL&BIOLOGICAL SBSTNC 01/30/2007  . Personal history of malignant neoplasm of breast 07/24/2008  . NEPHROLITHIASIS, HX OF 02/27/2010  . Vitreous detachment   . Shingles     recurrent  . Head trauma     closed  . Personal history of urinary calculi   . Post-traumatic wound infection 12/01/2010    Mild streaking superiorly and on one day of septra  Will give rocephin and add keflex  adn follow up     Family History  Problem Relation Age of Onset  . Breast cancer Daughter   . Tuberculosis Father     History   Social History  . Marital Status: Widowed    Spouse Name: N/A    Number of Children: N/A  . Years of Education: N/A   Social History Main Topics  . Smoking status: Former Smoker -- 1.00 packs/day for 30 years    Types: Cigarettes    Quit date: 06/15/2010  . Smokeless tobacco: Never Used     Comment: 2-3 cigs per week/// 06/30/12  . Alcohol Use: Yes     Comment: socially  . Drug Use: No  . Sexual Activity: None   Other Topics Concern  . None   Social History Narrative   Widowed   5 hours of sleep   Has friends checking on her   No Pets  Outpatient Encounter Prescriptions as of 05/09/2013  Medication Sig  . amLODipine (NORVASC) 10 MG tablet Take 1 tablet (10 mg total) by mouth daily.  Marland Kitchen aspirin 81 MG EC tablet Take 81 mg by mouth daily.    . Cholecalciferol (VITAMIN D) 1000 UNITS capsule Take 1,000 Units by mouth daily.    . fish oil-omega-3 fatty acids 1000 MG capsule Take 1 g by mouth daily.   . isosorbide mononitrate (IMDUR) 30 MG 24 hr tablet Take 1 tablet (30 mg total) by mouth daily.  Marland Kitchen levalbuterol (XOPENEX HFA) 45 MCG/ACT inhaler Inhale 2 puffs into the lungs every 6 (six) hours as needed for wheezing.  . nitroGLYCERIN (NITROSTAT) 0.4 MG SL tablet Place 1 tablet  (0.4 mg total) under the tongue every 5 (five) minutes as needed.  . tiotropium (SPIRIVA) 18 MCG inhalation capsule Place 1 capsule (18 mcg total) into inhaler and inhale daily.  . traMADol (ULTRAM) 50 MG tablet TAKE ONE TABLET BY MOUTH EVERY EIGHT HOURS AS NEEDED FOR PAIN    EXAM:  BP 164/64  Pulse 67  Temp(Src) 98.1 F (36.7 C) (Oral)  Ht 5' 3.5" (1.613 m)  Wt 105 lb (47.628 kg)  BMI 18.31 kg/m2  SpO2 97%  Body mass index is 18.31 kg/(m^2).  GENERAL: vitals reviewed and listed above, alert, oriented, appears well hydrated and in no acute distress slender as usual HEENT: atraumatic, conjunctiva  clear, no obvious abnormalities on inspection of external nose and ears   NECK: no obvious masses on inspection palpation  LUNGS: clear to auscultation bilaterally, no wheezes, rales or rhonchi, decreased air movement CV: HRRR, no clubbing cyanosis or  peripheral edema nl cap refill  MS: moves all extremities  gait is antalgic very little muscle mass left lower extremity with a almost 2 cm avulsion laceration without active bleeding or infection no pus is noted bruising area or superior no swelling no bony tenderness PSYCH: pleasant and cooperative, no obvious depression or anxiety cognition appears intact  ASSESSMENT AND PLAN:  Discussed the following assessment and plan:  Laceration of lower leg, left td utd - Avulsion type just above left ankle cleaned with saline Tegaderm placed samples given discussion about care and followup if gets infected-looking get checked di  Peripheral vascular disease, unspecified  Unspecified essential hypertension  Chronic airway obstruction, not elsewhere classified  Spinal stenosis, unspecified region other than cervical  low pressure up today taking medication not interested in adding could be related to painwas okay in the past monitor no acute findings today  Discussed money she might save if didn't spend any on tobacco.-Patient advised to return  or notify health care team  if symptoms worsen ,persist or new concerns arise.  Patient Instructions   Keep wound covered with tegaderm for now  If any infection looking then contact us for recheck.  bone density is good.  Bp is  Up today in office .  Need readings check  To make sure ok.  Goal below 150 . BP Readings from Last 3 Encounters:  05/09/13 164/64  04/02/13 128/84  01/23/13 158/60   return office visit in 6 months      Wanda K. [name] M.D.  Pre visit review using our clinic review tool, if applicable. No additional management support is needed unless otherwise documented below in the visit note.

## 2013-05-09 NOTE — Patient Instructions (Signed)
Keep wound covered with tegaderm for now  If any infection looking then contact us for recheck.  bone density is good.  Bp is  Up today in office .  Need readings check  To make sure ok.  Goal below 150 . BP Readings from Last 3 Encounters:  05/09/13 164/64  04/02/13 128/84  01/23/13 158/60   return office visit in 6 months

## 2013-05-21 ENCOUNTER — Other Ambulatory Visit: Payer: Self-pay | Admitting: Cardiology

## 2013-05-21 ENCOUNTER — Telehealth: Payer: Self-pay | Admitting: Pulmonary Disease

## 2013-05-21 ENCOUNTER — Other Ambulatory Visit: Payer: Self-pay | Admitting: Pulmonary Disease

## 2013-05-21 NOTE — Telephone Encounter (Signed)
PA received from [facility] on [address] in [place] through [employer] for Xopenex HFA 49mcg, 2 puffs every 6 hours prn. PA form completed manually and placed in RA's look-at's for signature. Will forward to [facility] to follow up on.

## 2013-05-23 NOTE — Telephone Encounter (Signed)
I have faxed form back. Will await approval/denial

## 2013-05-24 NOTE — Telephone Encounter (Signed)
Received approval. 1/1/1-12/31/15 Approval placed in RA scan folder. Called [facility] and made aware of approval as well.

## 2013-06-12 LAB — HM MAMMOGRAPHY

## 2013-06-28 ENCOUNTER — Encounter: Payer: Self-pay | Admitting: Internal Medicine

## 2013-08-02 ENCOUNTER — Telehealth: Payer: Self-pay | Admitting: Adult Health

## 2013-08-02 MED ORDER — CLOTRIMAZOLE 10 MG MT TROC: 10.0000 mg | Freq: Every day | OROMUCOSAL | Status: DC

## 2013-08-02 NOTE — Telephone Encounter (Signed)
That is fine  Mycelex troches 5 times daily for 7 days  Eat yogurt daily and consider probiotic Rinse after inhalers

## 2013-08-02 NOTE — Telephone Encounter (Signed)
Has she gotten thrush a lot  ? What kind of symptoms ? White patches in mouth??? Recent abx ??

## 2013-08-02 NOTE — Telephone Encounter (Signed)
Called spoke with patient who reports that her tongue is "coated in white" and "slimy" x2+ weeks.  She denies any soreness/pain, white patches in her throat.  Pt stated she is brushing/rinsing/gargling after using her inhalers.  She denies any recent antibiotics.  Pt stated that TP has given her Mycelex Troches in the past (verified 10/2010 and 01/2012) and this worked well for her - would like another rx.  Please advise Tammy, thanks!  [facility] Allergies  Allergen Reactions  . Oxycodone   . Statins   . Tequin    Current Outpatient Prescriptions on File Prior to Visit  Medication Sig Dispense Refill  . amLODipine (NORVASC) 10 MG tablet Take 1 tablet (10 mg total) by mouth daily.  90 tablet  2  . aspirin 81 MG EC tablet Take 81 mg by mouth daily.        . Cholecalciferol (VITAMIN D) 1000 UNITS capsule Take 1,000 Units by mouth daily.        . fish oil-omega-3 fatty acids 1000 MG capsule Take 1 g by mouth daily.       . isosorbide mononitrate (IMDUR) 30 MG 24 hr tablet Take one tablet by mouth one time daily  90 tablet  0  . nitroGLYCERIN (NITROSTAT) 0.4 MG SL tablet Place 1 tablet (0.4 mg total) under the tongue every 5 (five) minutes as needed.  25 tablet  3  . tiotropium (SPIRIVA) 18 MCG inhalation capsule Place 1 capsule (18 mcg total) into inhaler and inhale daily.  30 capsule  6  . traMADol (ULTRAM) 50 MG tablet TAKE ONE TABLET BY MOUTH EVERY EIGHT HOURS AS NEEDED FOR PAIN  40 tablet  0  . XOPENEX HFA 45 MCG/ACT inhaler INHALE TWO PUFFS EVERY 6 HOURS AS NEEDED FOR WHEEZING   15 g  2   No current facility-administered medications on file prior to visit.

## 2013-08-02 NOTE — Telephone Encounter (Signed)
Patient calling requesting meds for thrush be sent to [facility] Allergies  Allergen Reactions  . Oxycodone   . Statins   . Tequin    Please advise Tammy Parrett. Thanks!

## 2013-08-02 NOTE — Telephone Encounter (Signed)
Called and spoke with pt and she is aware of TP recs.  She is aware of med that has been sent to the pharmacy and she is aware of other recs from TP.  Nothing further is needed.

## 2013-08-16 ENCOUNTER — Ambulatory Visit (INDEPENDENT_AMBULATORY_CARE_PROVIDER_SITE_OTHER): Payer: Medicare HMO | Admitting: Cardiology

## 2013-08-16 ENCOUNTER — Encounter: Payer: Self-pay | Admitting: Cardiology

## 2013-08-16 VITALS — BP 120/60 | HR 62 | Ht 64.0 in | Wt 102.1 lb

## 2013-08-16 DIAGNOSIS — I6529 Occlusion and stenosis of unspecified carotid artery: Secondary | ICD-10-CM

## 2013-08-16 DIAGNOSIS — F17200 Nicotine dependence, unspecified, uncomplicated: Secondary | ICD-10-CM

## 2013-08-16 DIAGNOSIS — I2510 Atherosclerotic heart disease of native coronary artery without angina pectoris: Secondary | ICD-10-CM

## 2013-08-16 DIAGNOSIS — I739 Peripheral vascular disease, unspecified: Secondary | ICD-10-CM

## 2013-08-16 DIAGNOSIS — E785 Hyperlipidemia, unspecified: Secondary | ICD-10-CM

## 2013-08-16 DIAGNOSIS — I10 Essential (primary) hypertension: Secondary | ICD-10-CM

## 2013-08-16 DIAGNOSIS — I701 Atherosclerosis of renal artery: Secondary | ICD-10-CM

## 2013-08-16 MED ORDER — ISOSORBIDE MONONITRATE ER 30 MG PO TB24: 30.0000 mg | ORAL_TABLET | Freq: Every day | ORAL | Status: DC

## 2013-08-16 NOTE — Assessment & Plan Note (Signed)
Patient counseled on discontinuing. 

## 2013-08-16 NOTE — Patient Instructions (Signed)
Your physician wants you to follow-up in: ONE YEAR WITH DR CRENSHAW You will receive a reminder letter in the mail two months in advance. If you don't receive a letter, please call our office to schedule the follow-up appointment.  

## 2013-08-16 NOTE — Assessment & Plan Note (Signed)
Continue diet. Intolerant to statins. 

## 2013-08-16 NOTE — Assessment & Plan Note (Signed)
Continue aspirin. Followup carotid Dopplers September 2016.

## 2013-08-16 NOTE — Assessment & Plan Note (Signed)
Continue aspirin and statin. Intolerant to statins.

## 2013-08-16 NOTE — Assessment & Plan Note (Signed)
Continue aspirin. Intolerant to statins. 

## 2013-08-16 NOTE — Assessment & Plan Note (Signed)
Blood pressure controlled. Continue present medications. 

## 2013-08-16 NOTE — Progress Notes (Signed)
HPI: FU coronary artery disease, status post stenting of the right coronary artery in December 2005. Patient had cardiac catheterization in November of 2011 secondary to chest pain which revealed left main normal. The LAD had proximal luminal irregularities. First diagonal was large with ostial 25% stenosis. Circumflex in the AV groove had mid long 25% stenosis. Obtuse marginal was large and normal. The right coronary artery with long in-stent 30% stenosis. PDA was moderate size and normal. Posterolateral was small and normal. EF 65. Echo in June of 2012 showed normal LV function, mild AI and MR. Last renal Dopplers were in August 2010. Renal arteries showed no stenosis. There was a right renal mass. However dedicated ultrasound showed a renal cyst. She also has a history of cerebrovascular disease. Her most recent carotid Dopplers were performed in Sept 2014 showed a 40-59% bilateral stenosis. Followup was recommended in 2 years. She also has peripheral vascular disease and has seen Dr. Burt Knack. An arteriogram revealed occlusion of the left SFA. It was unfavorable for intervention and medical therapy was recommended if possible. ABIs in April of 2015 showed moderate decrease on the left and mild decrease on the right. Patient is being treated medically. Since I last saw her in July 2014, there is no increased dyspnea on exertion, orthopnea, PND, pedal edema, syncope or chest pain. She does have weakness in her left lower extremity with ambulation.   Current Outpatient Prescriptions  Medication Sig Dispense Refill  . amLODipine (NORVASC) 10 MG tablet Take 1 tablet (10 mg total) by mouth daily.  90 tablet  2  . aspirin 81 MG EC tablet Take 81 mg by mouth daily.        . Cholecalciferol (VITAMIN D) 1000 UNITS capsule Take 1,000 Units by mouth daily.        . fish oil-omega-3 fatty acids 1000 MG capsule Take 1 g by mouth daily.       . isosorbide mononitrate (IMDUR) 30 MG 24 hr tablet Take one tablet  by mouth one time daily  90 tablet  0  . nitroGLYCERIN (NITROSTAT) 0.4 MG SL tablet Place 1 tablet (0.4 mg total) under the tongue every 5 (five) minutes as needed.  25 tablet  3  . tiotropium (SPIRIVA) 18 MCG inhalation capsule Place 1 capsule (18 mcg total) into inhaler and inhale daily.  30 capsule  6  . traMADol (ULTRAM) 50 MG tablet TAKE ONE TABLET BY MOUTH EVERY EIGHT HOURS AS NEEDED FOR PAIN  40 tablet  0  . XOPENEX HFA 45 MCG/ACT inhaler INHALE TWO PUFFS EVERY 6 HOURS AS NEEDED FOR WHEEZING   15 g  2   No current facility-administered medications for this visit.     Past Medical History  Diagnosis Date  . POSTHERPETIC NEURALGIA 08/25/2006  . CARCINOMA, BASAL CELL 02/13/2006  . NEOPLASM, SKIN, UNCERTAIN BEHAVIOR 16/60/6301  . HYPOTHYROIDISM 01/30/2007  . Unspecified vitamin D deficiency 02/07/2007  . HYPERLIPIDEMIA 03/10/2008  . UNSPECIFIED ANEMIA 03/18/2008  . THROMBOCYTOPENIA 07/30/2008  . LOSS, HEARING NOS 08/25/2006  . HYPERTENSION 02/13/2006  . UNSTABLE ANGINA 12/12/2009  . CORONARY ARTERY DISEASE 02/13/2006  . CAROTID ARTERY DISEASE 10/03/2006  . RENAL ARTERY STENOSIS 08/01/2008  . PERIPHERAL VASCULAR DISEASE 02/13/2006  . Chronic rhinitis 12/13/2006  . CHRONIC OBSTRUCTIVE PULMONARY DISEASE, ACUTE EXACERBATION 11/17/2009  . DERMATITIS 09/25/2007  . DEGENERATIVE JOINT DISEASE 09/27/2006  . OSTEOARTHRITIS, HAND 12/16/2008  . SPINAL STENOSIS 11/01/2006  . LIVER FUNCTION TESTS, ABNORMAL 01/22/2009  . UNS  ADVRS EFF UNS RX MEDICINAL&BIOLOGICAL SBSTNC 01/30/2007  . Personal history of malignant neoplasm of breast 07/24/2008  . NEPHROLITHIASIS, HX OF 02/27/2010  . Vitreous detachment   . Shingles     recurrent  . Head trauma     closed  . Personal history of urinary calculi   . Post-traumatic wound infection 12/01/2010    Mild streaking superiorly and on one day of septra  Will give rocephin and add keflex  adn follow up     Past Surgical History  Procedure Laterality Date  . Lumbar  laminectomy    . Cataract extraction    . Abdominal hysterectomy    . Breast lumpectomy    . Tonsillectomy    . Coronary angioplasty with stent placement      bilat iliac stents, left renal artery and rt coronary artery last myoview 8/09 with EF 70%  . Skin cancer excision    . Breast lumpectomy  1998    History   Social History  . Marital Status: Widowed    Spouse Name: N/A    Number of Children: N/A  . Years of Education: N/A   Occupational History  . Not on file.   Social History Main Topics  . Smoking status: Former Smoker -- 1.00 packs/day for 30 years    Types: Cigarettes    Quit date: 06/15/2010  . Smokeless tobacco: Never Used     Comment: 2-3 cigs per week/// 06/30/12  . Alcohol Use: Yes     Comment: socially  . Drug Use: No  . Sexual Activity: Not on file   Other Topics Concern  . Not on file   Social History Narrative   Widowed   5 hours of sleep   Has friends checking on her   No Pets    ROS: Back pain and fatigue but no fevers or chills, productive cough, hemoptysis, dysphasia, odynophagia, melena, hematochezia, dysuria, hematuria, rash, seizure activity, orthopnea, PND, pedal edema, claudication. Remaining systems are negative.  Physical Exam: Well-developed frail in no acute distress.  Skin is warm and dry.  HEENT is normal.  Neck is supple.  Chest is clear to auscultation with normal expansion.  Cardiovascular exam is regular rate and rhythm.  Abdominal exam nontender or distended. No masses palpated. Extremities show no edema. neuro grossly intact  ECG Sinus rhythm at a rate of 62. Prior septal infarct. Nonspecific ST changes.

## 2013-08-16 NOTE — Assessment & Plan Note (Signed)
Continue aspirin, intolerance to statins

## 2013-08-22 ENCOUNTER — Emergency Department (INDEPENDENT_AMBULATORY_CARE_PROVIDER_SITE_OTHER)
Admission: EM | Admit: 2013-08-22 | Discharge: 2013-08-22 | Disposition: A | Discharge status: Home / Self Care | Payer: Medicare HMO | Source: Home / Self Care | Attending: Family Medicine | Admitting: Family Medicine

## 2013-08-22 ENCOUNTER — Telehealth: Payer: Self-pay | Admitting: Internal Medicine

## 2013-08-22 DIAGNOSIS — X58XXXA Exposure to other specified factors, initial encounter: Secondary | ICD-10-CM

## 2013-08-22 DIAGNOSIS — T148XXA Other injury of unspecified body region, initial encounter: Secondary | ICD-10-CM

## 2013-08-22 DIAGNOSIS — IMO0002 Reserved for concepts with insufficient information to code with codable children: Secondary | ICD-10-CM

## 2013-08-22 NOTE — Discharge Instructions (Signed)
Thank you for coming in today. Follow up with your doctor next week or back hear for a wound check.  Come back as needed.   Skin Tear Care A skin tear is a wound in which the top layer of skin has peeled off. This is a common problem with aging because the skin becomes thinner and more fragile as a person gets older. In addition, some medicines, such as oral corticosteroids, can lead to skin thinning if taken for long periods of time.  A skin tear is often repaired with tape or skin adhesive strips. This keeps the skin that has been peeled off in contact with the healthier skin beneath. Depending on the location of the wound, a bandage (dressing) may be applied over the tape or skin adhesive strips. Sometimes, during the healing process, the skin turns black and dies. Even when this happens, the torn skin acts as a good dressing until the skin underneath gets healthier and repairs itself. HOME CARE INSTRUCTIONS   Change dressings once per day or as directed by your caregiver.  Gently clean the skin tear and the area around the tear using saline solution or mild soap and water.  Do not rub the injured skin dry. Let the area air dry.  Apply petroleum jelly or an antibiotic cream or ointment to keep the tear moist. This will help the wound heal. Do not allow a scab to form.  If the dressing sticks before the next dressing change, moisten it with warm soapy water and gently remove it.  Protect the injured skin until it has healed.  Only take over-the-counter or prescription medicines as directed by your caregiver.  Take showers or baths using warm soapy water. Apply a new dressing after the shower or bath.  Keep all follow-up appointments as directed by your caregiver.  SEEK IMMEDIATE MEDICAL CARE IF:   You have redness, swelling, or increasing pain in the skin tear.  You havepus coming from the skin tear.  You have chills.  You have a red streak that goes away from the skin  tear.  You have a bad smell coming from the tear or dressing.  You have a fever or persistent symptoms for more than 2-3 days.  You have a fever and your symptoms suddenly get worse. MAKE SURE YOU:  Understand these instructions.  Will watch this condition.  Will get help right away if your child is not doing well or gets worse. Document Released: 10/13/2000 Document Revised: 10/13/2011 Document Reviewed: 08/02/2011 [facility] Patient Information 2015 [place], [place]. This information is not intended to replace advice given to you by your health care provider. Make sure you discuss any questions you have with your health care provider.

## 2013-08-22 NOTE — ED Notes (Signed)
C/o right leg laceration States this morning while outside she tripped on a wire fence that was not in the ground ems was called and they did wrap up laceration with gauges

## 2013-08-22 NOTE — ED Provider Notes (Signed)
[name] [name] is a 80 y.o. female who presents to Urgent Care today for right leg skin tear. Patient suffered a laceration/skin tear to her right anterior shin. This was treated via a.m. as. She's here today for followup. She feels well. Her last tetanus vaccination was approximately 2 years ago. She cut her leg on a chickenwire protecting her vegetable garden. The wound is irrigated and dressed at the sceen.   Past Medical History  Diagnosis Date  . POSTHERPETIC NEURALGIA 08/25/2006  . CARCINOMA, BASAL CELL 02/13/2006  . NEOPLASM, SKIN, UNCERTAIN BEHAVIOR 31/49/7026  . HYPOTHYROIDISM 01/30/2007  . Unspecified vitamin D deficiency 02/07/2007  . HYPERLIPIDEMIA 03/10/2008  . UNSPECIFIED ANEMIA 03/18/2008  . THROMBOCYTOPENIA 07/30/2008  . LOSS, HEARING NOS 08/25/2006  . HYPERTENSION 02/13/2006  . UNSTABLE ANGINA 12/12/2009  . CORONARY ARTERY DISEASE 02/13/2006  . CAROTID ARTERY DISEASE 10/03/2006  . RENAL ARTERY STENOSIS 08/01/2008  . PERIPHERAL VASCULAR DISEASE 02/13/2006  . Chronic rhinitis 12/13/2006  . CHRONIC OBSTRUCTIVE PULMONARY DISEASE, ACUTE EXACERBATION 11/17/2009  . DERMATITIS 09/25/2007  . DEGENERATIVE JOINT DISEASE 09/27/2006  . OSTEOARTHRITIS, HAND 12/16/2008  . SPINAL STENOSIS 11/01/2006  . LIVER FUNCTION TESTS, ABNORMAL 01/22/2009  . UNS ADVRS EFF UNS RX MEDICINAL&BIOLOGICAL SBSTNC 01/30/2007  . Personal history of malignant neoplasm of breast 07/24/2008  . NEPHROLITHIASIS, HX OF 02/27/2010  . Vitreous detachment   . Shingles     recurrent  . Head trauma     closed  . Personal history of urinary calculi   . Post-traumatic wound infection 12/01/2010    Mild streaking superiorly and on one day of septra  Will give rocephin and add keflex  adn follow up    History  Substance Use Topics  . Smoking status: Former Smoker -- 1.00 packs/day for 30 years    Types: Cigarettes    Quit date: 06/15/2010  . Smokeless tobacco: Never Used     Comment: 2-3 cigs per week/// 06/30/12  .  Alcohol Use: Yes     Comment: socially   ROS as above Medications: No current facility-administered medications for this encounter.   Current Outpatient Prescriptions  Medication Sig Dispense Refill  . amLODipine (NORVASC) 10 MG tablet Take 1 tablet (10 mg total) by mouth daily.  90 tablet  2  . aspirin 81 MG EC tablet Take 81 mg by mouth daily.        . Cholecalciferol (VITAMIN D) 1000 UNITS capsule Take 1,000 Units by mouth daily.        . fish oil-omega-3 fatty acids 1000 MG capsule Take 1 g by mouth daily.       . isosorbide mononitrate (IMDUR) 30 MG 24 hr tablet Take 1 tablet (30 mg total) by mouth daily.  90 tablet  3  . nitroGLYCERIN (NITROSTAT) 0.4 MG SL tablet Place 1 tablet (0.4 mg total) under the tongue every 5 (five) minutes as needed.  25 tablet  3  . tiotropium (SPIRIVA) 18 MCG inhalation capsule Place 1 capsule (18 mcg total) into inhaler and inhale daily.  30 capsule  6  . traMADol (ULTRAM) 50 MG tablet TAKE ONE TABLET BY MOUTH EVERY EIGHT HOURS AS NEEDED FOR PAIN  40 tablet  0  . XOPENEX HFA 45 MCG/ACT inhaler INHALE TWO PUFFS EVERY 6 HOURS AS NEEDED FOR WHEEZING   15 g  2    Exam:  BP 171/56  Pulse 63  Temp(Src) 98.7 F (37.1 C) (Oral)  Resp 14  SpO2 100% Gen: Well NAD Right leg:  Skin tear laceration and abrasion right leg. Lacerations and extend deep and are not repairable.  No results found for this or any previous visit (from the past 24 hour(s)). No results found.  Assessment and Plan: 80 y.o. female with skin tear. 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 Return to clinic for followup with primary care provider in a few days for wound check. Tetanus is up-to-date.  Discussed warning signs or symptoms. Please see discharge instructions. Patient expresses understanding.   This note was created using Systems analyst. Any transcription errors are unintended.    Gregor Hams, MD 08/22/13 [phone]

## 2013-08-22 NOTE — Telephone Encounter (Signed)
Spoke with patient and she states her leg is painful and she would like to see Dr Regis Bill although she would have to drive herself.  Patient is going to give urgent care a call.

## 2013-08-22 NOTE — Telephone Encounter (Signed)
Please triage this message  !     ?think she should see someone today ? Other office  Or practitioner today?? Urgent care?

## 2013-08-22 NOTE — Telephone Encounter (Signed)
Pt is needing and appt on tomorrow with dr. Regis Bill. Pt cut her leg, states ems was called and they stop the bleeding however informed her to see her primary. Pt declined coming in today to see another doctor states she prefer to see dr. Regis Bill. Ok to use sda slot for tomorrow? Pt states wound is very bad skin is torn with open wound.

## 2013-08-23 NOTE — Telephone Encounter (Signed)
Fine to see her Monday  However   Idont see any documentation of how large and how deep  the laceration was and how this was dressed ? Tegaderm? Other non stick ?   When you call the patient please find out  And document this information. ([facility] was [facility] to [facility] because she said she couldn't  Drive to see [place] in the office )

## 2013-08-23 NOTE — Telephone Encounter (Signed)
Patient was seen in the Urgent Care.  Please review note.  Pt made an appt for Monday (only day she could come) for a follow up and to have her dressings changed.  Advised to follow up with PCP in a few days per UC note.

## 2013-08-24 NOTE — Telephone Encounter (Signed)
1. Thank you!

## 2013-08-24 NOTE — Telephone Encounter (Signed)
Has one laceration on her shin that is about 2 inches and then one on her calf that is about the size of a quarter.  EMS dressed the wound with Adattic Non-Adhering dressing. The Urgent Care dressed her wounds with Curad Xeroform Petrolatum Dressing.  Will send to [facility] for review.

## 2013-08-27 ENCOUNTER — Encounter: Payer: Self-pay | Admitting: Internal Medicine

## 2013-08-27 ENCOUNTER — Ambulatory Visit (INDEPENDENT_AMBULATORY_CARE_PROVIDER_SITE_OTHER): Payer: Medicare HMO | Admitting: Internal Medicine

## 2013-08-27 VITALS — BP 128/66 | HR 80 | Temp 98.6°F | Wt 103.0 lb

## 2013-08-27 DIAGNOSIS — IMO0002 Reserved for concepts with insufficient information to code with codable children: Secondary | ICD-10-CM

## 2013-08-27 DIAGNOSIS — S81811A Laceration without foreign body, right lower leg, initial encounter: Secondary | ICD-10-CM | POA: Insufficient documentation

## 2013-08-27 DIAGNOSIS — L089 Local infection of the skin and subcutaneous tissue, unspecified: Secondary | ICD-10-CM | POA: Insufficient documentation

## 2013-08-27 DIAGNOSIS — S81811S Laceration without foreign body, right lower leg, sequela: Secondary | ICD-10-CM

## 2013-08-27 DIAGNOSIS — T148XXA Other injury of unspecified body region, initial encounter: Secondary | ICD-10-CM

## 2013-08-27 DIAGNOSIS — T798XXA Other early complications of trauma, initial encounter: Secondary | ICD-10-CM

## 2013-08-27 MED ORDER — CEPHALEXIN 500 MG PO CAPS
500.0000 mg | ORAL_CAPSULE | Freq: Three times a day (TID) | ORAL | Status: DC
Start: 2013-08-27 — End: 2013-09-03

## 2013-08-27 NOTE — Patient Instructions (Signed)
Tegaderm to cover as you have in past . Add keflex  For 7 days for the redness around the upper lesion.

## 2013-08-27 NOTE — Progress Notes (Signed)
Pre visit review using our clinic review tool, if applicable. No additional management support is needed unless otherwise documented below in the visit note.  Chief Complaint  Patient presents with  . Wound Check    HPI: Patient come in for follow up from [facility]  Visit for laceration non suturable.  Non-when she went out to the fill the birdbath and  Ran into fencing  In garden trying to fix    sustained 2 lacerations right lower leg  And bled  A lot  . Psychologist, occupational and then [employer]  . Couldn't come into office that day   Leg hurt too much to drive so ended up at [facility] who  changed dressing to petroleum gauze . Since that time still has some pain but no fever or systemic symptoms. No active bleeding until gauze was pulled off today.  Also discussed a home health visit from now nurse practitioner and wanted to review that data that she states was in correct.  ROS: See pertinent positives and negatives per HPI. No current chest pain and she denies shortness of breath. No fever or chills there is some increasing redness around the lateral abrasion  Past Medical History  Diagnosis Date  . POSTHERPETIC NEURALGIA 08/25/2006  . CARCINOMA, BASAL CELL 02/13/2006  . NEOPLASM, SKIN, UNCERTAIN BEHAVIOR 44/04/4740  . HYPOTHYROIDISM 01/30/2007  . Unspecified vitamin D deficiency 02/07/2007  . HYPERLIPIDEMIA 03/10/2008  . UNSPECIFIED ANEMIA 03/18/2008  . THROMBOCYTOPENIA 07/30/2008  . LOSS, HEARING NOS 08/25/2006  . HYPERTENSION 02/13/2006  . UNSTABLE ANGINA 12/12/2009  . CORONARY ARTERY DISEASE 02/13/2006  . CAROTID ARTERY DISEASE 10/03/2006  . RENAL ARTERY STENOSIS 08/01/2008  . PERIPHERAL VASCULAR DISEASE 02/13/2006  . Chronic rhinitis 12/13/2006  . CHRONIC OBSTRUCTIVE PULMONARY DISEASE, ACUTE EXACERBATION 11/17/2009  . DERMATITIS 09/25/2007  . DEGENERATIVE JOINT DISEASE 09/27/2006  . OSTEOARTHRITIS, HAND 12/16/2008  . SPINAL STENOSIS 11/01/2006  . LIVER FUNCTION TESTS, ABNORMAL 01/22/2009  . UNS ADVRS EFF  UNS RX MEDICINAL&BIOLOGICAL SBSTNC 01/30/2007  . Personal history of malignant neoplasm of breast 07/24/2008  . NEPHROLITHIASIS, HX OF 02/27/2010  . Vitreous detachment   . Shingles     recurrent  . Head trauma     closed  . Personal history of urinary calculi   . Post-traumatic wound infection 12/01/2010    Mild streaking superiorly and on one day of septra  Will give rocephin and add keflex  adn follow up     Family History  Problem Relation Age of Onset  . Breast cancer Daughter   . Tuberculosis Father     History   Social History  . Marital Status: Widowed    Spouse Name: N/A    Number of Children: N/A  . Years of Education: N/A   Social History Main Topics  . Smoking status: Former Smoker -- 1.00 packs/day for 30 years    Types: Cigarettes    Quit date: 06/15/2010  . Smokeless tobacco: Never Used     Comment: 2-3 cigs per week/// 06/30/12  . Alcohol Use: Yes     Comment: socially  . Drug Use: No  . Sexual Activity: None   Other Topics Concern  . None   Social History Narrative   Widowed   5 hours of sleep   Has friends checking on her   No Pets    Outpatient Encounter Prescriptions as of 08/27/2013  Medication Sig  . amLODipine (NORVASC) 10 MG tablet Take 1 tablet (10 mg total) by mouth  daily.  . aspirin 81 MG EC tablet Take 81 mg by mouth daily.    . Cholecalciferol (VITAMIN D) 1000 UNITS capsule Take 1,000 Units by mouth daily.    . fish oil-omega-3 fatty acids 1000 MG capsule Take 1 g by mouth daily.   . isosorbide mononitrate (IMDUR) 30 MG 24 hr tablet Take 1 tablet (30 mg total) by mouth daily.  . nitroGLYCERIN (NITROSTAT) 0.4 MG SL tablet Place 1 tablet (0.4 mg total) under the tongue every 5 (five) minutes as needed.  . tiotropium (SPIRIVA) 18 MCG inhalation capsule Place 1 capsule (18 mcg total) into inhaler and inhale daily.  . traMADol (ULTRAM) 50 MG tablet TAKE ONE TABLET BY MOUTH EVERY EIGHT HOURS AS NEEDED FOR PAIN  . XOPENEX HFA 45 MCG/ACT  inhaler INHALE TWO PUFFS EVERY 6 HOURS AS NEEDED FOR WHEEZING   . cephALEXin (KEFLEX) 500 MG capsule Take 1 capsule (500 mg total) by mouth 3 (three) times daily.    EXAM:  BP 128/66  Pulse 80  Temp(Src) 98.6 F (37 C)  Wt 103 lb (46.72 kg)  SpO2 96%  Body mass index is 17.67 kg/(m^2).  GENERAL: vitals reviewed and listed above, alert, oriented, appears well hydrated and in no acute distress  right lower extremity evaluated cognition intact respirations normal at rest HEENT: atraumatic, conjunctiva  clear, no obvious abnormalities on inspection of external nose and ears NECK: no obvious masses on inspection palpation   Right lower extremity with about a 3-4 cm avulsion laceration without discharge skin darkening new streaking or pus.  A 2 cm gouged laceration medial proximal with clean bases but with redness about 2 cm around without streaking. There is no induration bled a little bit uncomfortable. Gently cleaned with sterile saline by CMA PSYCH: pleasant and cooperative,   ASSESSMENT AND PLAN:  Discussed the following assessment and plan:  Laceration of right lower leg with complication, sequela - Possibly early infection on the proximal medial laceration. Add Keflex 3 times a day for 5-7 days local wound care/  still think Tegaderm no pus discharge noted  Post-traumatic wound infection, initial encounter - Seems minor at this point but will add antibiotic because of risk. Recheck in one week  rediscussed injury prevention. Also reviewed the information the nurse practitioner from that now had given her and her report nothing that we didn't know and some of it is probably incorrect. However I do not think it's going to change her health status at this time. -Patient advised to return or notify health care team  if symptoms worsen ,persist or new concerns arise.  Patient Instructions  Tegaderm to cover as you have in past . Add keflex  For 7 days for the redness around the upper  lesion.     Standley Brooking. Dresean Beckel M.D. Total visit 12mins > 50% spent counseling and coordinating care  reviewed and discussed wound care and health assessment done by nurse practitioner.

## 2013-08-29 ENCOUNTER — Telehealth: Payer: Self-pay | Admitting: Internal Medicine

## 2013-08-29 NOTE — Telephone Encounter (Signed)
Caller: [name] [name]/Patient; Phone: [phone]; Reason for Call: Seen in office on Monday 7/27 concerning 2 wounds on right leg from barbed wire injuries.   Started Keflex and applied Tegaderm in office - note in EPIC says "to cover as you have in past".   Patient asking if she needs to leave dressings on or if they need to be changed.  If she needs dressing changes, will need Tegaderm for the procedures.  Notes the wound that was open, still is painful.   Please review and clarify the Tegaderm instructions to patient that can be reached at [phone].

## 2013-08-29 NOTE — Telephone Encounter (Signed)
Keep the tegaderm on  Until i see her  Unless falling off. unless it is filling up with fluid or something is getting worse.

## 2013-08-29 NOTE — Telephone Encounter (Signed)
Spoke with patient and an appointment is scheduled for Monday

## 2013-09-03 ENCOUNTER — Encounter: Payer: Self-pay | Admitting: Internal Medicine

## 2013-09-03 ENCOUNTER — Ambulatory Visit (INDEPENDENT_AMBULATORY_CARE_PROVIDER_SITE_OTHER): Payer: Medicare HMO | Admitting: Internal Medicine

## 2013-09-03 VITALS — BP 156/64 | HR 66 | Temp 98.5°F | Ht 63.5 in | Wt 103.0 lb

## 2013-09-03 DIAGNOSIS — T798XXS Other early complications of trauma, sequela: Secondary | ICD-10-CM

## 2013-09-03 DIAGNOSIS — T799XXS Unspecified early complication of trauma, sequela: Secondary | ICD-10-CM

## 2013-09-03 DIAGNOSIS — S81811S Laceration without foreign body, right lower leg, sequela: Secondary | ICD-10-CM

## 2013-09-03 DIAGNOSIS — IMO0002 Reserved for concepts with insufficient information to code with codable children: Secondary | ICD-10-CM

## 2013-09-03 NOTE — Progress Notes (Signed)
  Pre visit review using our clinic review tool, if applicable. No additional management support is needed unless otherwise documented below in the visit note.  Chief Complaint  Patient presents with  . Follow-up    HPI: [name] [name] is here for fu of  Leg wound and infection  Sustained in garden  Pain is gone antibiotic finished  Had Tegaderm on wound  No weeping now.   ROS: See pertinent positives and negatives per HPI.no fever new sx   PM[facility] SOC HX reviewed  Outpatient Encounter Prescriptions as of 09/03/2013  Medication Sig  . amLODipine (NORVASC) 10 MG tablet Take 1 tablet (10 mg total) by mouth daily.  Marland Kitchen aspirin 81 MG EC tablet Take 81 mg by mouth daily.    . Cholecalciferol (VITAMIN D) 1000 UNITS capsule Take 1,000 Units by mouth daily.    . fish oil-omega-3 fatty acids 1000 MG capsule Take 1 g by mouth daily.   . isosorbide mononitrate (IMDUR) 30 MG 24 hr tablet Take 1 tablet (30 mg total) by mouth daily.  . nitroGLYCERIN (NITROSTAT) 0.4 MG SL tablet Place 1 tablet (0.4 mg total) under the tongue every 5 (five) minutes as needed.  . tiotropium (SPIRIVA) 18 MCG inhalation capsule Place 1 capsule (18 mcg total) into inhaler and inhale daily.  . traMADol (ULTRAM) 50 MG tablet TAKE ONE TABLET BY MOUTH EVERY EIGHT HOURS AS NEEDED FOR PAIN  . XOPENEX HFA 45 MCG/ACT inhaler INHALE TWO PUFFS EVERY 6 HOURS AS NEEDED FOR WHEEZING   . [DISCONTINUED] cephALEXin (KEFLEX) 500 MG capsule Take 1 capsule (500 mg total) by mouth 3 (three) times daily.    EXAM:  BP 156/64  Pulse 66  Temp(Src) 98.5 F (36.9 C) (Oral)  Ht 5' 3.5" (1.613 m)  Wt 103 lb (46.72 kg)  BMI 17.96 kg/m2  SpO2 95%  Body mass index is 17.96 kg/(m^2).  GENERAL: vitals reviewed and listed above, alert, oriented, appears well hydrated and in no acute distress slender alert  Cognition intact  MS: moves all extremities   RLE  Retracting wound avulsion no redness  Some scabbing  About 4 cm area  Medial upper still  moist wound base 2 cm no redness or discharger   No streaking or edema PSYCH: pleasant and cooperative, no obvious depression or anxiety  ASSESSMENT AND PLAN:  Discussed the following assessment and plan:  Post-traumatic wound infection, sequela  Laceration of right lower leg with complication, sequela Much improved  Local care clean and tegaderm again until healed .  -Patient advised to return or notify health care team  if symptoms worsen ,persist or new concerns arise.  Patient Instructions  Continue local care tegaderm recheck prn or when due.     Standley Brooking. [name] M.D.

## 2013-09-03 NOTE — Patient Instructions (Signed)
Continue local care tegaderm recheck prn or when due.

## 2013-10-03 ENCOUNTER — Ambulatory Visit (INDEPENDENT_AMBULATORY_CARE_PROVIDER_SITE_OTHER): Payer: Medicare HMO | Admitting: Adult Health

## 2013-10-03 ENCOUNTER — Encounter: Payer: Self-pay | Admitting: Adult Health

## 2013-10-03 VITALS — BP 140/60 | HR 61 | Temp 98.2°F | Ht 64.0 in | Wt 103.0 lb

## 2013-10-03 DIAGNOSIS — Z23 Encounter for immunization: Secondary | ICD-10-CM

## 2013-10-03 DIAGNOSIS — J449 Chronic obstructive pulmonary disease, unspecified: Secondary | ICD-10-CM

## 2013-10-03 MED ORDER — LEVALBUTEROL TARTRATE 45 MCG/ACT IN AERO: INHALATION_SPRAY | RESPIRATORY_TRACT | Status: DC

## 2013-10-03 MED ORDER — TIOTROPIUM BROMIDE MONOHYDRATE 18 MCG IN CAPS
18.0000 ug | ORAL_CAPSULE | Freq: Every day | RESPIRATORY_TRACT | Status: DC
Start: 2013-10-03 — End: 2014-07-24

## 2013-10-03 NOTE — Patient Instructions (Addendum)
Flu shot today .  Continue on Spiriva daily  Follow up Dr. Elsworth Soho  In 3-4 months and As needed

## 2013-10-03 NOTE — Progress Notes (Signed)
   Subjective:    Patient ID: [name] [name], female    DOB: [birth year], 80 y.o.   MRN: [id number]  HPI PCP - [name]  [name],ex - smoker, with CAD, PVD, gold stg 2 COPD & recurrent LLL atelectasis  6/12 >>Spirometry >>FEV1 61%  She quit smoking in 06/2010  She was first admitted 6/12 for hypoxia & LLL atelectasis.  Ct chest showed Volume loss in the inferior left hemithorax with filling defect of the left lower and less so left upper lobe  Bronchoscopy showed LLL endobronchial narrowing, without discrete mass. Papule on rt mainstem bronchus was brushed - cytology all neg. BAL cx, afb, fungal neg  Re- admitted 09/2010 for pneumonia & LLL atx. Rpt bscopy >>thick mucoid secretions with difficulty passing scope. Cytology was neg for malignancy . Path showed AMYLOID DEPOSITION AND DYSTROPHIC CALCIFICATIONS.  CT sinuses 8/12 neg  CT 5/13.>>No residual lymphadenopathy. No evidence of metastatic breast cancer. Stable chronic lung disease with biapical scarring, diffuse calcifications of the tracheobronchial tree and mucous impaction of the lingular bronchus  Dulera caused her mouth irritation & shakes  Flare in dec '13 - >>augmentin and steroid taper  05/08/12 Acute OV >>augmentin /pred taper   - she has designated son in [place] as [place] Has living will & DNR document  She lives alone & is very functional    04/02/13  [name] would like her to try albuterol MDI instead of xopenex - but she developed tremors with albuterol She has sorted out dysfunctional family issues after daughter's death - on spiriva  Breathing is unchanged. Reports slight SOB. Denies chest tightness or wheezing.   10/03/2013 Follow up COPD  Pt returns for follow up for COPD  Remains on Spiriva daily , admits does not take everyday.  Is very active, mows grass, works in garden.  No flare in cough, wheezing or dyspnea.  Denies hemoptysis , chest pain , orthopnea or edema.     Review of Systems  neg for any significant  sore throat, dysphagia, itching, sneezing, nasal congestion or excess/ purulent secretions, fever, chills, sweats, unintended wt loss, pleuritic or exertional cp, hempoptysis, orthopnea pnd or change in chronic leg swelling. Also denies presyncope, palpitations, heartburn, abdominal pain, nausea, vomiting, diarrhea or change in bowel or urinary habits, dysuria,hematuria, rash, arthralgias, visual complaints, headache, numbness weakness or ataxia.     Objective:   Physical Exam   Gen. Pleasant, thin woman, in no distress ENT - no lesions, no post nasal drip Neck: No JVD, no thyromegaly, no carotid bruits Lungs: no use of accessory muscles, no dullness to percussion, decreased without rales or rhonchi  Cardiovascular: Rhythm regular, heart sounds  normal, no murmurs or gallops, no peripheral edema Musculoskeletal: No deformities, no cyanosis or clubbing        Assessment & Plan:

## 2013-10-03 NOTE — Addendum Note (Signed)
Addended by: Parke Poisson E on: 10/03/2013 10:27 AM   Modules accepted: Orders

## 2013-10-03 NOTE — Assessment & Plan Note (Addendum)
Compensated on present regimen  Recommended cxr today however she declines.  Flu shot today   Plan  Flu shot today .  Continue on Spiriva daily  Follow up Dr. Elsworth Soho  In 3-4 months and As needed

## 2013-10-11 NOTE — Progress Notes (Signed)
Reviewed & agree with plan  

## 2014-01-01 ENCOUNTER — Ambulatory Visit (INDEPENDENT_AMBULATORY_CARE_PROVIDER_SITE_OTHER)
Admission: RE | Admit: 2014-01-01 | Discharge: 2014-01-01 | Disposition: A | Discharge status: Home / Self Care | Payer: Medicare HMO | Source: Ambulatory Visit | Attending: Pulmonary Disease | Admitting: Pulmonary Disease

## 2014-01-01 ENCOUNTER — Ambulatory Visit (INDEPENDENT_AMBULATORY_CARE_PROVIDER_SITE_OTHER): Payer: Medicare HMO | Admitting: Pulmonary Disease

## 2014-01-01 ENCOUNTER — Ambulatory Visit: Payer: Medicare HMO | Admitting: Internal Medicine

## 2014-01-01 ENCOUNTER — Encounter: Payer: Self-pay | Admitting: Pulmonary Disease

## 2014-01-01 VITALS — BP 140/58 | HR 64 | Ht 64.0 in | Wt 108.0 lb

## 2014-01-01 DIAGNOSIS — J441 Chronic obstructive pulmonary disease with (acute) exacerbation: Secondary | ICD-10-CM

## 2014-01-01 NOTE — Patient Instructions (Signed)
We will obtain prior auth for xopenex CXR today

## 2014-01-01 NOTE — Assessment & Plan Note (Signed)
We will obtain prior auth for xopenex CXR today Stay on [place] to take every other day Immunization uptodate

## 2014-01-01 NOTE — Progress Notes (Signed)
   Subjective:    Patient ID: [name] [name], female    DOB: 1927/04/21, 80 y.o.   MRN: [id number]  HPI  PCP - [name]   86/F,ex - smoker, with CAD, PVD, gold stg 2 COPD & recurrent LLL atelectasis  She quit smoking in 06/2010   - she has designated son in [place] as [place] Has living will & DNR document  She lives alone & is very functional   Significant tests/ events   07/2010 >>Spirometry >>FEV1 61%  She was first admitted 07/2010 for hypoxia & LLL atelectasis.  Ct chest showed Volume loss in the inferior left hemithorax with filling defect of the left lower and less so left upper lobe  Bronchoscopy showed LLL endobronchial narrowing, without discrete mass. Papule on rt mainstem bronchus was brushed - cytology all neg. BAL cx, afb, fungal neg  Re- admitted 09/2010 for pneumonia & LLL atx. Rpt bscopy >>thick mucoid secretions with difficulty passing scope. Cytology was neg for malignancy . Path showed AMYLOID DEPOSITION AND DYSTROPHIC CALCIFICATIONS.  CT sinuses 09/2010 neg  CT 06/2011.>>No residual lymphadenopathy. No evidence of metastatic breast cancer. Stable chronic lung disease with biapical scarring, diffuse calcifications of the tracheobronchial tree and mucous impaction of the lingular bronchus   Meds -Dulera caused her mouth irritation & shakes , albuterol causes tremors -hence xopenex MDI  05/08/12 Acute OV >>augmentin /pred taper      01/01/2014  Chief Complaint  Patient presents with  . Follow-up    breathing good; no complaints     [employer] would again like her to try albuterol MDI instead of xopenex -  she developed tremors with albuterol & was unable to tolerate - we filed prior auth for 2015  Breathing is unchanged. Reports slight SOB. Denies chest tightness or wheezing. Does not take spiriva daily Lives alone, Is very active, mows grass, works in garden.  No flare in cough, wheezing or dyspnea.  Has oral surgery planned - does not have dental  coverage   Review of Systems neg for any significant sore throat, dysphagia, itching, sneezing, nasal congestion or excess/ purulent secretions, fever, chills, sweats, unintended wt loss, pleuritic or exertional cp, hempoptysis, orthopnea pnd or change in chronic leg swelling. Also denies presyncope, palpitations, heartburn, abdominal pain, nausea, vomiting, diarrhea or change in bowel or urinary habits, dysuria,hematuria, rash, arthralgias, visual complaints, headache, numbness weakness or ataxia.     Objective:   Physical Exam  Gen. Pleasant, well-nourished, in no distress ENT - no lesions, no post nasal drip Neck: No JVD, no thyromegaly, no carotid bruits Lungs: no use of accessory muscles, no dullness to percussion, coarse Bs BL without rales or rhonchi  Cardiovascular: Rhythm regular, heart sounds  normal, no murmurs or gallops, no peripheral edema Musculoskeletal: No deformities, no cyanosis or clubbing        Assessment & Plan:

## 2014-01-31 ENCOUNTER — Ambulatory Visit (INDEPENDENT_AMBULATORY_CARE_PROVIDER_SITE_OTHER): Payer: Medicare HMO | Admitting: Cardiovascular Disease

## 2014-01-31 ENCOUNTER — Encounter: Payer: Self-pay | Admitting: Cardiovascular Disease

## 2014-01-31 VITALS — BP 140/62 | HR 67 | Ht 64.0 in | Wt 109.4 lb

## 2014-01-31 DIAGNOSIS — I739 Peripheral vascular disease, unspecified: Secondary | ICD-10-CM

## 2014-01-31 NOTE — Progress Notes (Signed)
Background: The patient is followed for peripheral arterial disease. She also has coronary artery disease with history of right coronary artery stenting in 2005. She is followed by Dr. Stanford Breed for her cardiac problems.  HPI:  80 year old woman presenting for follow-up evaluation. She was last seen in December 2014. The patient has had a tough time since I last saw her. She's having a lot of stress related to problems with her house. She's had a leaky roof for several years and has been battling with her insurance company.  She continues to have pain in her legs. She is not able to walk very far. She complains of pain starting in the hips and going down both legs. She does not have typical symptoms of calf claudication. She denies ulceration of the feet and has had no rest pain. She denies chest pain or shortness of breath. She's had no stroke or TIA symptoms. She does complain of bruising easily.  Studies:  ABIs 05/07/2013: 0.84 on the right, 0.64 on the left  Aortoiliac duplex 05/07/2013: Heavy aortoiliac calcification and atherosclerosis without focal stenosis.  Carotid duplex 10/12/2012: 40-59% bilateral ICA stenosis. 2 year follow-up recommended.  Lipid Panel     Component Value Date/Time   CHOL 159 08/30/2011 0852   TRIG 53.0 08/30/2011 0852   HDL 65.00 08/30/2011 0852   CHOLHDL 2 08/30/2011 0852   VLDL 10.6 08/30/2011 0852   LDLCALC 83 08/30/2011 0852     Outpatient Encounter Prescriptions as of 01/31/2014  Medication Sig  . amLODipine (NORVASC) 10 MG tablet Take 1 tablet (10 mg total) by mouth daily.  Marland Kitchen aspirin 81 MG EC tablet Take 81 mg by mouth daily.    . carboxymethylcellulose (REFRESH PLUS) 0.5 % SOLN Place 1 drop into both eyes 4 (four) times daily.  . Cholecalciferol (VITAMIN D) 1000 UNITS capsule Take 1,000 Units by mouth daily.    . fish oil-omega-3 fatty acids 1000 MG capsule Take 1 g by mouth daily.   . isosorbide mononitrate (IMDUR) 30 MG 24 hr tablet Take 1  tablet (30 mg total) by mouth daily.  Marland Kitchen levalbuterol (XOPENEX HFA) 45 MCG/ACT inhaler INHALE TWO PUFFS EVERY 6 HOURS AS NEEDED FOR WHEEZING  . nitroGLYCERIN (NITROSTAT) 0.4 MG SL tablet Place 1 tablet (0.4 mg total) under the tongue every 5 (five) minutes as needed.  . tiotropium (SPIRIVA) 18 MCG inhalation capsule Place 1 capsule (18 mcg total) into inhaler and inhale daily.  . traMADol (ULTRAM) 50 MG tablet TAKE ONE TABLET BY MOUTH EVERY EIGHT HOURS AS NEEDED FOR PAIN    Allergies  Allergen Reactions  . Statins Other (See Comments)    Elevated liver enzymes  . Oxycodone Anxiety  . Tequin Other (See Comments)    Patient doesn't recall    Past Medical History  Diagnosis Date  . POSTHERPETIC NEURALGIA 08/25/2006  . CARCINOMA, BASAL CELL 02/13/2006  . NEOPLASM, SKIN, UNCERTAIN BEHAVIOR 97/35/3299  . HYPOTHYROIDISM 01/30/2007  . Unspecified vitamin D deficiency 02/07/2007  . HYPERLIPIDEMIA 03/10/2008  . UNSPECIFIED ANEMIA 03/18/2008  . THROMBOCYTOPENIA 07/30/2008  . LOSS, HEARING NOS 08/25/2006  . HYPERTENSION 02/13/2006  . UNSTABLE ANGINA 12/12/2009  . CORONARY ARTERY DISEASE 02/13/2006  . CAROTID ARTERY DISEASE 10/03/2006  . RENAL ARTERY STENOSIS 08/01/2008  . PERIPHERAL VASCULAR DISEASE 02/13/2006  . Chronic rhinitis 12/13/2006  . CHRONIC OBSTRUCTIVE PULMONARY DISEASE, ACUTE EXACERBATION 11/17/2009  . DERMATITIS 09/25/2007  . DEGENERATIVE JOINT DISEASE 09/27/2006  . OSTEOARTHRITIS, HAND 12/16/2008  . SPINAL STENOSIS 11/01/2006  .  LIVER FUNCTION TESTS, ABNORMAL 01/22/2009  . UNS ADVRS EFF UNS RX MEDICINAL&BIOLOGICAL SBSTNC 01/30/2007  . Personal history of malignant neoplasm of breast 07/24/2008  . NEPHROLITHIASIS, HX OF 02/27/2010  . Vitreous detachment   . Shingles     recurrent  . Head trauma     closed  . Personal history of urinary calculi   . Post-traumatic wound infection 12/01/2010    Mild streaking superiorly and on one day of septra  Will give rocephin and add keflex  adn follow  up     family history includes Breast cancer in her daughter; Tuberculosis in her father.   BP 140/62 mmHg  Pulse 67  Ht 5\' 4"  (1.626 m)  Wt 109 lb 6.4 oz (49.624 kg)  BMI 18.77 kg/m2  PHYSICAL EXAM: Pt is alert and oriented, pleasant elderly woman in NAD HEENT: normal Neck: JVP - normal, carotids 2+= with bilateral bruits Lungs: Extensive diffuse rhonchi bilaterally CV: RRR without murmur or gallop Abd: soft, NT, Positive BS, no hepatomegaly Ext: no C/C/E, femoral pulses are palpable bilaterally, 1+. Pedal pulses are nonpalpable. There are no ulcerations on the feet. There is some ecchymosis bilaterally in the lower legs Skin: warm/dry no rash  ASSESSMENT AND PLAN: 1. Lower extremity peripheral arterial disease. Suspect multifactorial leg pain. ABIs and duplex studies reviewed from earlier this year and they demonstrate stable findings. I would be inclined to follow her clinically and I will see her back in one year. She will continue on her current medical program.  2. Carotid stenosis with no history of stroke or TIA. Continue with surveillance duplex ultrasound at two-year intervals to follow bilateral 40-59% stenosis.  3. Tobacco abuse. Cessation counseling done. She is not interested in quitting.  4. Coronary artery disease, native vessel, without symptoms of angina. Followed by Dr. Stanford Breed.  Sherren Mocha, MD 01/31/2014 9:28 AM

## 2014-01-31 NOTE — Patient Instructions (Signed)
Your physician wants you to follow-up in: 1 year with Dr. Cooper.  You will receive a reminder letter in the mail two months in advance. If you don't receive a letter, please call our office to schedule the follow-up appointment.  Your physician recommends that you continue on your current medications as directed. Please refer to the Current Medication list given to you today.  

## 2014-02-19 ENCOUNTER — Other Ambulatory Visit: Payer: Self-pay

## 2014-02-19 MED ORDER — AMLODIPINE BESYLATE 10 MG PO TABS: 10.0000 mg | ORAL_TABLET | Freq: Every day | ORAL | Status: DC

## 2014-04-11 ENCOUNTER — Other Ambulatory Visit: Payer: Self-pay | Admitting: Dermatology

## 2014-05-20 ENCOUNTER — Other Ambulatory Visit (HOSPITAL_COMMUNITY): Payer: Self-pay | Admitting: Cardiovascular Disease

## 2014-05-20 DIAGNOSIS — I739 Peripheral vascular disease, unspecified: Secondary | ICD-10-CM

## 2014-05-20 DIAGNOSIS — I700 Atherosclerosis of aorta: Secondary | ICD-10-CM

## 2014-06-14 ENCOUNTER — Ambulatory Visit (HOSPITAL_BASED_OUTPATIENT_CLINIC_OR_DEPARTMENT_OTHER): Payer: Medicare HMO

## 2014-06-14 ENCOUNTER — Encounter (HOSPITAL_COMMUNITY): Payer: Medicare HMO

## 2014-06-14 ENCOUNTER — Ambulatory Visit (HOSPITAL_COMMUNITY): Payer: Medicare HMO | Attending: Internal Medicine

## 2014-06-14 DIAGNOSIS — I2510 Atherosclerotic heart disease of native coronary artery without angina pectoris: Secondary | ICD-10-CM | POA: Insufficient documentation

## 2014-06-14 DIAGNOSIS — Z8673 Personal history of transient ischemic attack (TIA), and cerebral infarction without residual deficits: Secondary | ICD-10-CM | POA: Insufficient documentation

## 2014-06-14 DIAGNOSIS — I739 Peripheral vascular disease, unspecified: Secondary | ICD-10-CM | POA: Diagnosis not present

## 2014-06-14 DIAGNOSIS — Z48812 Encounter for surgical aftercare following surgery on the circulatory system: Secondary | ICD-10-CM | POA: Diagnosis not present

## 2014-06-14 DIAGNOSIS — I10 Essential (primary) hypertension: Secondary | ICD-10-CM | POA: Diagnosis not present

## 2014-06-14 DIAGNOSIS — E785 Hyperlipidemia, unspecified: Secondary | ICD-10-CM | POA: Insufficient documentation

## 2014-06-14 DIAGNOSIS — I700 Atherosclerosis of aorta: Secondary | ICD-10-CM | POA: Diagnosis not present

## 2014-06-14 DIAGNOSIS — J449 Chronic obstructive pulmonary disease, unspecified: Secondary | ICD-10-CM | POA: Diagnosis not present

## 2014-06-14 DIAGNOSIS — Z87891 Personal history of nicotine dependence: Secondary | ICD-10-CM | POA: Diagnosis not present

## 2014-06-25 ENCOUNTER — Encounter: Payer: Self-pay | Admitting: Pulmonary Disease

## 2014-06-25 ENCOUNTER — Ambulatory Visit (INDEPENDENT_AMBULATORY_CARE_PROVIDER_SITE_OTHER): Payer: Medicare HMO | Admitting: Pulmonary Disease

## 2014-06-25 VITALS — BP 118/78 | HR 75 | Wt 102.0 lb

## 2014-06-25 DIAGNOSIS — J439 Emphysema, unspecified: Secondary | ICD-10-CM

## 2014-06-25 LAB — HM MAMMOGRAPHY

## 2014-06-25 NOTE — Patient Instructions (Signed)
Stay on spiriv Xopenex as needed only

## 2014-06-25 NOTE — Progress Notes (Signed)
   Subjective:    Patient ID: [name] [name], female    DOB: Sep 29, 1927, 81 y.o.   MRN: [id number]  HPI  PCP - [name]   86/F,ex - smoker, with CAD, PVD, gold stg 2 COPD & recurrent LLL atelectasis  She quit smoking in 06/2010   - she has designated son in [place] as [place] Has living will & DNR document  She lives alone & is very functional   Significant tests/ events   07/2010 >>Spirometry >>FEV1 61%  She was first admitted 07/2010 for hypoxia & LLL atelectasis.  Ct chest showed Volume loss in the inferior left hemithorax with filling defect of the left lower and less so left upper lobe  Bronchoscopy showed LLL endobronchial narrowing, without discrete mass. Papule on rt mainstem bronchus was brushed - cytology all neg. BAL cx, afb, fungal neg  Re- admitted 09/2010 for pneumonia & LLL atx. Rpt bscopy >>thick mucoid secretions with difficulty passing scope. Cytology was neg for malignancy . Path showed AMYLOID DEPOSITION AND DYSTROPHIC CALCIFICATIONS.  CT sinuses 09/2010 neg  CT 06/2011.>>No residual lymphadenopathy. No evidence of metastatic breast cancer. Stable chronic lung disease with biapical scarring, diffuse calcifications of the tracheobronchial tree and mucous impaction of the lingular bronchus   Meds -Dulera caused her mouth irritation & shakes , albuterol causes tremors -hence xopenex MDI    06/25/2014  Chief Complaint  Patient presents with  . Follow-up    COPD; not issues with breathing at this time;     she developed tremors with albuterol & was unable to tolerate - Xopenex was approved  Breathing is unchanged. Reports slight SOB. Denies chest tightness or wheezing. Does not take spiriva daily Lives alone, Is very active, mows grass, works in garden.  No flare in cough, wheezing or dyspnea.  Underwent oral surgery for ? Osteo & resection of skin cancer on leg -has recovered well   Review of Systems neg for any significant sore throat, dysphagia, itching,  sneezing, nasal congestion or excess/ purulent secretions, fever, chills, sweats, unintended wt loss, pleuritic or exertional cp, hempoptysis, orthopnea pnd or change in chronic leg swelling. Also denies presyncope, palpitations, heartburn, abdominal pain, nausea, vomiting, diarrhea or change in bowel or urinary habits, dysuria,hematuria, rash, arthralgias, visual complaints, headache, numbness weakness or ataxia.     Objective:   Physical Exam  Gen. Pleasant, thin, in no distress ENT - no lesions, no post nasal drip Neck: No JVD, no thyromegaly, no carotid bruits Lungs: no use of accessory muscles, no dullness to percussion, clear without rales or rhonchi  Cardiovascular: Rhythm regular, heart sounds  normal, no murmurs or gallops, no peripheral edema Musculoskeletal: No deformities, no cyanosis or clubbing        Assessment & Plan:

## 2014-06-26 ENCOUNTER — Encounter: Payer: Self-pay | Admitting: Family Medicine

## 2014-06-26 NOTE — Assessment & Plan Note (Signed)
Stay on spiriv Xopenex as needed only

## 2014-07-24 ENCOUNTER — Other Ambulatory Visit: Payer: Self-pay | Admitting: *Deleted

## 2014-07-24 MED ORDER — TIOTROPIUM BROMIDE MONOHYDRATE 18 MCG IN CAPS: 18.0000 ug | ORAL_CAPSULE | Freq: Every day | RESPIRATORY_TRACT | Status: DC

## 2014-08-02 ENCOUNTER — Ambulatory Visit (INDEPENDENT_AMBULATORY_CARE_PROVIDER_SITE_OTHER)
Admission: RE | Admit: 2014-08-02 | Discharge: 2014-08-02 | Disposition: A | Discharge status: Home / Self Care | Payer: Medicare HMO | Source: Ambulatory Visit | Attending: Internal Medicine | Admitting: Internal Medicine

## 2014-08-02 ENCOUNTER — Ambulatory Visit (INDEPENDENT_AMBULATORY_CARE_PROVIDER_SITE_OTHER): Payer: Medicare HMO | Admitting: Internal Medicine

## 2014-08-02 ENCOUNTER — Encounter: Payer: Self-pay | Admitting: Internal Medicine

## 2014-08-02 VITALS — BP 110/50 | HR 70 | Temp 99.1°F | Ht 62.5 in | Wt 99.9 lb

## 2014-08-02 DIAGNOSIS — R509 Fever, unspecified: Principal | ICD-10-CM

## 2014-08-02 DIAGNOSIS — R636 Underweight: Secondary | ICD-10-CM

## 2014-08-02 DIAGNOSIS — I10 Essential (primary) hypertension: Secondary | ICD-10-CM

## 2014-08-02 DIAGNOSIS — J989 Respiratory disorder, unspecified: Secondary | ICD-10-CM

## 2014-08-02 DIAGNOSIS — J440 Chronic obstructive pulmonary disease with acute lower respiratory infection: Secondary | ICD-10-CM

## 2014-08-02 DIAGNOSIS — R202 Paresthesia of skin: Secondary | ICD-10-CM

## 2014-08-02 DIAGNOSIS — Z853 Personal history of malignant neoplasm of breast: Secondary | ICD-10-CM

## 2014-08-02 DIAGNOSIS — R200 Anesthesia of skin: Secondary | ICD-10-CM

## 2014-08-02 LAB — CBC WITH DIFFERENTIAL/PLATELET
BASOS ABS: 0 10*3/uL (ref 0.0–0.1)
Basophils Relative: 0.1 % (ref 0.0–3.0)
EOS ABS: 0 10*3/uL (ref 0.0–0.7)
Eosinophils Relative: 0.3 % (ref 0.0–5.0)
HCT: 40.2 % (ref 36.0–46.0)
HEMOGLOBIN: 13.2 g/dL (ref 12.0–15.0)
LYMPHS PCT: 5.8 % — AB (ref 12.0–46.0)
Lymphs Abs: 0.7 10*3/uL (ref 0.7–4.0)
MCHC: 33 g/dL (ref 30.0–36.0)
MCV: 91.4 fl (ref 78.0–100.0)
MONO ABS: 1 10*3/uL (ref 0.1–1.0)
Monocytes Relative: 8.1 % (ref 3.0–12.0)
NEUTROS PCT: 85.7 % — AB (ref 43.0–77.0)
Neutro Abs: 10.2 10*3/uL — ABNORMAL HIGH (ref 1.4–7.7)
PLATELETS: 142 10*3/uL — AB (ref 150.0–400.0)
RBC: 4.39 Mil/uL (ref 3.87–5.11)
RDW: 15.6 % — ABNORMAL HIGH (ref 11.5–15.5)
WBC: 12 10*3/uL — ABNORMAL HIGH (ref 4.0–10.5)

## 2014-08-02 LAB — BASIC METABOLIC PANEL
BUN: 17 mg/dL (ref 6–23)
CO2: 29 mEq/L (ref 19–32)
Calcium: 9.3 mg/dL (ref 8.4–10.5)
Chloride: 104 mEq/L (ref 96–112)
Creatinine, Ser: 1.04 mg/dL (ref 0.40–1.20)
GFR: 53.57 mL/min — ABNORMAL LOW (ref >60.00)
GLUCOSE: 103 mg/dL — AB (ref 70–99)
Potassium: 3.4 mEq/L — ABNORMAL LOW (ref 3.5–5.1)
SODIUM: 140 meq/L (ref 135–145)

## 2014-08-02 LAB — LIPID PANEL
CHOL/HDL RATIO: 2
CHOLESTEROL: 160 mg/dL (ref 0–200)
HDL: 64.7 mg/dL (ref >39.00)
LDL CALC: 84 mg/dL (ref 0–99)
NonHDL: 95.3
TRIGLYCERIDES: 57 mg/dL (ref 0.0–149.0)
VLDL: 11.4 mg/dL (ref 0.0–40.0)

## 2014-08-02 LAB — HEPATIC FUNCTION PANEL
ALBUMIN: 3.4 g/dL — AB (ref 3.5–5.2)
ALT: 33 U/L (ref 0–35)
AST: 51 U/L — AB (ref 0–37)
Alkaline Phosphatase: 92 U/L (ref 39–117)
Bilirubin, Direct: 0.2 mg/dL (ref 0.0–0.3)
Total Bilirubin: 1 mg/dL (ref 0.2–1.2)
Total Protein: 6.2 g/dL (ref 6.0–8.3)

## 2014-08-02 LAB — TSH: TSH: 2.04 u[IU]/mL (ref 0.35–4.50)

## 2014-08-02 MED ORDER — DOXYCYCLINE HYCLATE 100 MG PO TABS: 100.0000 mg | ORAL_TABLET | Freq: Two times a day (BID) | ORAL | Status: DC

## 2014-08-02 MED ORDER — CEFTRIAXONE SODIUM 1 G IJ SOLR
1.0000 g | Freq: Once | INTRAMUSCULAR | Status: AC
  Administered 2014-08-02: 1 g via INTRAMUSCULAR

## 2014-08-02 NOTE — Addendum Note (Signed)
Addended by: Miles Costain T on: 08/02/2014 10:03 AM   Modules accepted: Orders

## 2014-08-02 NOTE — Patient Instructions (Addendum)
Need to treat you for lung .  Infection  concern about early pneumonia Giving you an injection of antibiotic  today and then take pills of doxycycline one twice a day for a week. Get chest x ray .   In the interim . A day if you can.  The left arm numbness that you have had for a while seems like it could be a pinch nerve perhaps in your neck that is radiating down and causing a problem. If you get acute worsening and weakness in the arm you need to seek emergent care. Otherwise we will recheck you in follow-up. Sometimes we give prednisone to decrease the swelling around the nerve but I want to wait until your infection is better. Sometimes we need either neurology or orthopedics spine evaluate.

## 2014-08-02 NOTE — Progress Notes (Signed)
Pre visit review using our clinic review tool, if applicable. No additional management support is needed unless otherwise documented below in the visit note.  Chief Complaint  Patient presents with  . Left Arm Numbness    Arm numbness for several months.  Fever and Cough started 2 days ago.  . Fever  . Cough  . Fatigue    HPI: Patient [name] [name]  comes in today for SDA for  new problem evaluation. X 2  She has had 2 days of fever chills feeling cold and coughing up phlegm that looks like mustard. There is no blood she feels very bad using Xopenex at night. Her energy is down. Has some midline chest discomfort no pleurisy temp 100 .4    She is also complaining that she has had for a number of months maybe even each year of left arm numbness it is not permanent but feels cool to the touch but no color change gets pain around the left scapula associated with this. She feels her left arm is getting weaker. No acute events in this area. No chest pain with this  States that she is seen about 6 doctors the last few months of all her specialists  Tonia Brooms  Removals legs and chin Periodontitis dontists and some type of antibody doesn't remember what Saw rheumatologist . Osteoporosis. Has a follow-up appointment with cardiology in the next month. Has seen Dr. Burt Knack from the vascular point of view feels things are stable has carotid peripheral disease. Dr. although is pulmonary on inhalers stable.  ROS: See pertinent positives and negatives per HPI. She lives alone was working in the yard a few days ago.  Past Medical History  Diagnosis Date  . POSTHERPETIC NEURALGIA 08/25/2006  . CARCINOMA, BASAL CELL 02/13/2006  . NEOPLASM, SKIN, UNCERTAIN BEHAVIOR 99/83/3825  . HYPOTHYROIDISM 01/30/2007  . Unspecified vitamin D deficiency 02/07/2007  . HYPERLIPIDEMIA 03/10/2008  . UNSPECIFIED ANEMIA 03/18/2008  . THROMBOCYTOPENIA 07/30/2008  . LOSS, HEARING NOS 08/25/2006  . HYPERTENSION 02/13/2006    . UNSTABLE ANGINA 12/12/2009  . CORONARY ARTERY DISEASE 02/13/2006  . CAROTID ARTERY DISEASE 10/03/2006  . RENAL ARTERY STENOSIS 08/01/2008  . PERIPHERAL VASCULAR DISEASE 02/13/2006  . Chronic rhinitis 12/13/2006  . CHRONIC OBSTRUCTIVE PULMONARY DISEASE, ACUTE EXACERBATION 11/17/2009  . DERMATITIS 09/25/2007  . DEGENERATIVE JOINT DISEASE 09/27/2006  . OSTEOARTHRITIS, HAND 12/16/2008  . SPINAL STENOSIS 11/01/2006  . LIVER FUNCTION TESTS, ABNORMAL 01/22/2009  . UNS ADVRS EFF UNS RX MEDICINAL&BIOLOGICAL SBSTNC 01/30/2007  . Personal history of malignant neoplasm of breast 07/24/2008  . NEPHROLITHIASIS, HX OF 02/27/2010  . Vitreous detachment   . Shingles     recurrent  . Head trauma     closed  . Personal history of urinary calculi   . Post-traumatic wound infection 12/01/2010    Mild streaking superiorly and on one day of septra  Will give rocephin and add keflex  adn follow up     Family History  Problem Relation Age of Onset  . Breast cancer Daughter   . Tuberculosis Father     History   Social History  . Marital Status: Widowed    Spouse Name: N/A  . Number of Children: N/A  . Years of Education: N/A   Social History Main Topics  . Smoking status: Former Smoker -- 1.00 packs/day for 30 years    Types: Cigarettes    Quit date: 06/15/2010  . Smokeless tobacco: Never Used     Comment: 2-3 cigs per  week/// 06/30/12  . Alcohol Use: Yes     Comment: socially  . Drug Use: No  . Sexual Activity: Not on file   Other Topics Concern  . None   Social History Narrative   Widowed   5 hours of sleep   Has friends checking on her   No Pets    Outpatient Prescriptions Prior to Visit  Medication Sig Dispense Refill  . amLODipine (NORVASC) 10 MG tablet Take 1 tablet (10 mg total) by mouth daily. 90 tablet 3  . aspirin 81 MG EC tablet Take 81 mg by mouth daily.      . carboxymethylcellulose (REFRESH PLUS) 0.5 % SOLN Place 1 drop into both eyes 4 (four) times daily.    .  Cholecalciferol (VITAMIN D) 1000 UNITS capsule Take 1,000 Units by mouth daily.      . fish oil-omega-3 fatty acids 1000 MG capsule Take 1 g by mouth daily.     . isosorbide mononitrate (IMDUR) 30 MG 24 hr tablet Take 1 tablet (30 mg total) by mouth daily. 90 tablet 3  . levalbuterol (XOPENEX HFA) 45 MCG/ACT inhaler INHALE TWO PUFFS EVERY 6 HOURS AS NEEDED FOR WHEEZING 15 g 6  . nitroGLYCERIN (NITROSTAT) 0.4 MG SL tablet Place 1 tablet (0.4 mg total) under the tongue every 5 (five) minutes as needed. 25 tablet 3  . tiotropium (SPIRIVA) 18 MCG inhalation capsule Place 1 capsule (18 mcg total) into inhaler and inhale daily. 30 capsule 6  . traMADol (ULTRAM) 50 MG tablet TAKE ONE TABLET BY MOUTH EVERY EIGHT HOURS AS NEEDED FOR PAIN 40 tablet 0   No facility-administered medications prior to visit.     EXAM:  BP 110/50 mmHg  Pulse 70  Temp(Src) 99.1 F (37.3 C) (Oral)  Ht 5' 2.5" (1.588 m)  Wt 99 lb 14.4 oz (45.314 kg)  BMI 17.97 kg/m2  SpO2 94%  Body mass index is 17.97 kg/(m^2).  GENERAL: vitals reviewed and listed above, alert, oriented, appears well hydrated and in no acute distress alert coherent looks like she doesn't feel well but is nontoxic HEENT: atraumatic, conjunctiva  clear, no obvious abnormalities on inspection of external nose and ears OP : no lesion edema or exudate  NECK: no obvious masses on inspection palpation  LUNGS:  Diffuse rhonchi throughout breath sounds are equal cannot tell about rales versus rhonchi no retractions CV: HRRR, no clubbing cyanosis or  peripheral edema nl cap refill  MS: moves all extremities  left arm less strength than right but no acute weakness normal warmth pulses intact even though patient states it feels cool. States it feels numb. No asymmetric atrophy she has decreased muscle mass throughout underweight. PSYCH: pleasant and cooperative, .   ASSESSMENT AND PLAN:  Discussed the following assessment and plan:  Respiratory illness with  fever - Concern about pneumonia air space disease on top of significant COPD - Plan: DG Chest 2 View, CBC with Differential/Platelet, Basic metabolic panel, Hepatic function panel, Lipid panel, TSH  Chronic obstructive pulmonary disease with acute lower respiratory infection - Plan: DG Chest 2 View, CBC with Differential/Platelet, Basic metabolic panel, Hepatic function panel, Lipid panel, TSH  Numbness and tingling in left arm - Possible radicular based on context and duration. - Plan: CBC with Differential/Platelet, Basic metabolic panel, Hepatic function panel, Lipid panel, TSH  Essential hypertension - Plan: CBC with Differential/Platelet, Basic metabolic panel, Hepatic function panel, Lipid panel, TSH  Underweight  HX: breast cancer - Get follow-up chest x-ray She  is well overdue for laboratory monitoring treat for acute bacterial respiratory infection today cover for pneumonia it is a Friday Rocephin chest x-ray doxycycline follow-up in a week seek emergent care if needed.  Numbness in arm appears to be more chronic and may be radicular. Associated with some pain. -Patient advised to return or notify health care team  if symptoms worsen ,persist or new concerns arise.  Patient Instructions  Need to treat you for lung .  Infection  concern about early pneumonia Giving you an injection of antibiotic  today and then take pills of doxycycline one twice a day for a week. Get chest x ray .   In the interim . A day if you can.  The left arm numbness that you have had for a while seems like it could be a pinch nerve perhaps in your neck that is radiating down and causing a problem. If you get acute worsening and weakness in the arm you need to seek emergent care. Otherwise we will recheck you in follow-up. Sometimes we give prednisone to decrease the swelling around the nerve but I want to wait until your infection is better. Sometimes we need either neurology or orthopedics spine  evaluate.      Standley Brooking. [name] M.D.

## 2014-08-12 ENCOUNTER — Encounter: Payer: Self-pay | Admitting: Internal Medicine

## 2014-08-12 ENCOUNTER — Ambulatory Visit (INDEPENDENT_AMBULATORY_CARE_PROVIDER_SITE_OTHER): Payer: Medicare HMO | Admitting: Internal Medicine

## 2014-08-12 VITALS — BP 156/60 | HR 67 | Temp 97.9°F | Ht 62.5 in | Wt 100.8 lb

## 2014-08-12 DIAGNOSIS — R350 Frequency of micturition: Secondary | ICD-10-CM | POA: Diagnosis not present

## 2014-08-12 DIAGNOSIS — R7989 Other specified abnormal findings of blood chemistry: Secondary | ICD-10-CM | POA: Diagnosis not present

## 2014-08-12 DIAGNOSIS — R200 Anesthesia of skin: Secondary | ICD-10-CM

## 2014-08-12 DIAGNOSIS — I10 Essential (primary) hypertension: Secondary | ICD-10-CM

## 2014-08-12 DIAGNOSIS — R945 Abnormal results of liver function studies: Secondary | ICD-10-CM

## 2014-08-12 DIAGNOSIS — R509 Fever, unspecified: Secondary | ICD-10-CM

## 2014-08-12 DIAGNOSIS — J989 Respiratory disorder, unspecified: Secondary | ICD-10-CM | POA: Diagnosis not present

## 2014-08-12 DIAGNOSIS — R636 Underweight: Secondary | ICD-10-CM

## 2014-08-12 DIAGNOSIS — R5383 Other fatigue: Secondary | ICD-10-CM

## 2014-08-12 DIAGNOSIS — R202 Paresthesia of skin: Principal | ICD-10-CM

## 2014-08-12 LAB — POCT URINALYSIS DIPSTICK
GLUCOSE UA: NEGATIVE
Ketones, UA: NEGATIVE
LEUKOCYTES UA: NEGATIVE
NITRITE UA: NEGATIVE
SPEC GRAV UA: 1.025
Urobilinogen, UA: 0.2
pH, UA: 5.5

## 2014-08-12 NOTE — Patient Instructions (Addendum)
Could be many causes of fatigue .including age   We will look for correctable causes of fatigue.   The numbness  Could be from a pinched nerve in the spine    In the neck  If need more evaluation may advise see Dr  neurology or  Possible orthopedics   Your potassium is slightly low  unknown issue.  Could add to  Fatigue  .  Check urine today .   The liver test are slightly abnormal but could be from the illness you had   Increase  Potassium rich foods at this time   Then plan recheck BMP and magnesium level    And LFTS in 3-4 weeks or as needed    Possible adding to fatigue   return office visit in 2 months or as needed .    Potassium Content of Foods Potassium is a mineral found in many foods and drinks. It helps keep fluids and minerals balanced in your body and affects how steadily your heart beats. Potassium also helps control your blood pressure and keep your muscles and nervous system healthy. Certain health conditions and medicines may change the balance of potassium in your body. When this happens, you can help balance your level of potassium through the foods that you do or do not eat. Your health care provider or dietitian may recommend an amount of potassium that you should have each day. The following lists of foods provide the amount of potassium (in parentheses) per serving in each item. HIGH IN POTASSIUM  The following foods and beverages have 200 mg or more of potassium per serving:  Apricots, 2 raw or 5 dry (200 mg).  Artichoke, 1 medium (345 mg).  Avocado, raw,  each (245 mg).  Banana, 1 medium (425 mg).  Beans, lima, or baked beans, canned,  cup (280 mg).  Beans, white, canned,  cup (595 mg).  Beef roast, 3 oz (320 mg).  Beef, ground, 3 oz (270 mg).  Beets, raw or cooked,  cup (260 mg).  Bran muffin, 2 oz (300 mg).  Broccoli,  cup (230 mg).  Brussels sprouts,  cup (250 mg).  Cantaloupe,  cup (215 mg).  Cereal, 100% bran,  cup (200-400  mg).  Cheeseburger, single, fast food, 1 each (225-400 mg).  Chicken, 3 oz (220 mg).  Clams, canned, 3 oz (535 mg).  Crab, 3 oz (225 mg).  Dates, 5 each (270 mg).  Dried beans and peas,  cup (300-475 mg).  Figs, dried, 2 each (260 mg).  Fish: halibut, tuna, cod, snapper, 3 oz (480 mg).  Fish: salmon, haddock, swordfish, perch, 3 oz (300 mg).  Fish, tuna, canned 3 oz (200 mg).  [place] fries, fast food, 3 oz (470 mg).  Granola with fruit and nuts,  cup (200 mg).  Grapefruit juice,  cup (200 mg).  Greens, beet,  cup (655 mg).  Honeydew melon,  cup (200 mg).  Kale, raw, 1 cup (300 mg).  Kiwi, 1 medium (240 mg).  Kohlrabi, rutabaga, parsnips,  cup (280 mg).  Lentils,  cup (365 mg).  Mango, 1 each (325 mg).  Milk, chocolate, 1 cup (420 mg).  Milk: nonfat, low-fat, whole, buttermilk, 1 cup (350-380 mg).  Molasses, 1 Tbsp (295 mg).  Mushrooms,  cup (280) mg.  Nectarine, 1 each (275 mg).  Nuts: almonds, peanuts, hazelnuts, [place], cashew, mixed, 1 oz (200 mg).  Nuts, pistachios, 1 oz (295 mg).  Orange, 1 each (240 mg).  Orange juice,  cup (  235 mg).  Papaya, medium,  fruit (390 mg).  Peanut butter, chunky, 2 Tbsp (240 mg).  Peanut butter, smooth, 2 Tbsp (210 mg).  Pear, 1 medium (200 mg).  Pomegranate, 1 whole (400 mg).  Pomegranate juice,  cup (215 mg).  Pork, 3 oz (350 mg).  Potato chips, salted, 1 oz (465 mg).  Potato, baked with skin, 1 medium (925 mg).  Potatoes, boiled,  cup (255 mg).  Potatoes, mashed,  cup (330 mg).  Prune juice,  cup (370 mg).  Prunes, 5 each (305 mg).  Pudding, chocolate,  cup (230 mg).  Pumpkin, canned,  cup (250 mg).  Raisins, seedless,  cup (270 mg).  Seeds, sunflower or pumpkin, 1 oz (240 mg).  Soy milk, 1 cup (300 mg).  Spinach,  cup (420 mg).  Spinach, canned,  cup (370 mg).  Sweet potato, baked with skin, 1 medium (450 mg).  Swiss chard,  cup (480 mg).  Tomato or  vegetable juice,  cup (275 mg).  Tomato sauce or puree,  cup (400-550 mg).  Tomato, raw, 1 medium (290 mg).  Tomatoes, canned,  cup (200-300 mg).  [place], 3 oz (250 mg).  Wheat germ, 1 oz (250 mg).  Winter squash,  cup (250 mg).  Yogurt, plain or fruited, 6 oz (260-435 mg).  Zucchini,  cup (220 mg). MODERATE IN POTASSIUM The following foods and beverages have 50-200 mg of potassium per serving:  Apple, 1 each (150 mg).  Apple juice,  cup (150 mg).  Applesauce,  cup (90 mg).  Apricot nectar,  cup (140 mg).  Asparagus, small spears,  cup or 6 spears (155 mg).  Bagel, cinnamon raisin, 1 each (130 mg).  Bagel, egg or plain, 4 in., 1 each (70 mg).  Beans, green,  cup (90 mg).  Beans, yellow,  cup (190 mg).  Beer, regular, 12 oz (100 mg).  Beets, canned,  cup (125 mg).  Blackberries,  cup (115 mg).  Blueberries,  cup (60 mg).  Bread, whole wheat, 1 slice (70 mg).  Broccoli, raw,  cup (145 mg).  Cabbage,  cup (150 mg).  Carrots, cooked or raw,  cup (180 mg).  Cauliflower, raw,  cup (150 mg).  Celery, raw,  cup (155 mg).  Cereal, bran flakes, cup (120-150 mg).  Cheese, cottage,  cup (110 mg).  Cherries, 10 each (150 mg).  Chocolate, 1 oz bar (165 mg).  Coffee, brewed 6 oz (90 mg).  Corn,  cup or 1 ear (195 mg).  Cucumbers,  cup (80 mg).  Egg, large, 1 each (60 mg).  Eggplant,  cup (60 mg).  Endive, raw, cup (80 mg).  English muffin, 1 each (65 mg).  Fish, orange roughy, 3 oz (150 mg).  Frankfurter, beef or pork, 1 each (75 mg).  Fruit cocktail,  cup (115 mg).  Grape juice,  cup (170 mg).  Grapefruit,  fruit (175 mg).  Grapes,  cup (155 mg).  Greens: kale, turnip, collard,  cup (110-150 mg).  Ice cream or frozen yogurt, chocolate,  cup (175 mg).  Ice cream or frozen yogurt, vanilla,  cup (120-150 mg).  Lemons, limes, 1 each (80 mg).  Lettuce, all types, 1 cup (100 mg).  Mixed vegetables,   cup (150 mg).  Mushrooms, raw,  cup (110 mg).  Nuts: walnuts, pecans, or macadamia, 1 oz (125 mg).  Oatmeal,  cup (80 mg).  Okra,  cup (110 mg).  Onions, raw,  cup (120 mg).  Peach, 1 each (185 mg).  Peaches,  canned,  cup (120 mg).  Pears, canned,  cup (120 mg).  Peas, green, frozen,  cup (90 mg).  Peppers, green,  cup (130 mg).  Peppers, red,  cup (160 mg).  Pineapple juice,  cup (165 mg).  Pineapple, fresh or canned,  cup (100 mg).  Plums, 1 each (105 mg).  Pudding, vanilla,  cup (150 mg).  Raspberries,  cup (90 mg).  Rhubarb,  cup (115 mg).  Rice, wild,  cup (80 mg).  Shrimp, 3 oz (155 mg).  Spinach, raw, 1 cup (170 mg).  Strawberries,  cup (125 mg).  Summer squash  cup (175-200 mg).  Swiss chard, raw, 1 cup (135 mg).  Tangerines, 1 each (140 mg).  Tea, brewed, 6 oz (65 mg).  Turnips,  cup (140 mg).  Watermelon,  cup (85 mg).  Wine, red, table, 5 oz (180 mg).  Wine, white, table, 5 oz (100 mg). LOW IN POTASSIUM The following foods and beverages have less than 50 mg of potassium per serving.  Bread, white, 1 slice (30 mg).  Carbonated beverages, 12 oz (less than 5 mg).  Cheese, 1 oz (20-30 mg).  Cranberries,  cup (45 mg).  Cranberry juice cocktail,  cup (20 mg).  Fats and oils, 1 Tbsp (less than 5 mg).  Hummus, 1 Tbsp (32 mg).  Nectar: papaya, mango, or pear,  cup (35 mg).  Rice, white or brown,  cup (50 mg).  Spaghetti or macaroni,  cup cooked (30 mg).  Tortilla, flour or corn, 1 each (50 mg).  Waffle, 4 in., 1 each (50 mg).  Water chestnuts,  cup (40 mg). Document Released: 09/01/2004 Document Revised: 01/23/2013 Document Reviewed: 12/15/2012 [facility] Patient Information 2015 [place], [place]. This information is not intended to replace advice given to you by your health care provider. Make sure you discuss any questions you have with your health care provider.

## 2014-08-12 NOTE — Progress Notes (Signed)
Pre visit review using our clinic review tool, if applicable. No additional management support is needed unless otherwise documented below in the visit note.  Chief Complaint  Patient presents with  . Follow-up    HPI: [name] [name] 81 y.o.  comes in for chronic disease/ medication management  Loss of energy over the past months no specific pain except below  says cough fever better   And not"SOB"  Persistent  Left.arm numbness and shoulder blade  "for a while" left arm feels cool  No falling tobacco only ocass.  ROS: See pertinent positives and negatives per HPI.increase urinary frequency .no hemoptysis swelling  No New palpitation racing  Past Medical History  Diagnosis Date  . POSTHERPETIC NEURALGIA 08/25/2006  . CARCINOMA, BASAL CELL 02/13/2006  . NEOPLASM, SKIN, UNCERTAIN BEHAVIOR 09/73/5329  . HYPOTHYROIDISM 01/30/2007  . Unspecified vitamin D deficiency 02/07/2007  . HYPERLIPIDEMIA 03/10/2008  . UNSPECIFIED ANEMIA 03/18/2008  . THROMBOCYTOPENIA 07/30/2008  . LOSS, HEARING NOS 08/25/2006  . HYPERTENSION 02/13/2006  . UNSTABLE ANGINA 12/12/2009  . CORONARY ARTERY DISEASE 02/13/2006  . CAROTID ARTERY DISEASE 10/03/2006  . RENAL ARTERY STENOSIS 08/01/2008  . PERIPHERAL VASCULAR DISEASE 02/13/2006  . Chronic rhinitis 12/13/2006  . CHRONIC OBSTRUCTIVE PULMONARY DISEASE, ACUTE EXACERBATION 11/17/2009  . DERMATITIS 09/25/2007  . DEGENERATIVE JOINT DISEASE 09/27/2006  . OSTEOARTHRITIS, HAND 12/16/2008  . SPINAL STENOSIS 11/01/2006  . LIVER FUNCTION TESTS, ABNORMAL 01/22/2009  . UNS ADVRS EFF UNS RX MEDICINAL&BIOLOGICAL SBSTNC 01/30/2007  . Personal history of malignant neoplasm of breast 07/24/2008  . NEPHROLITHIASIS, HX OF 02/27/2010  . Vitreous detachment   . Shingles     recurrent  . Head trauma     closed  . Personal history of urinary calculi   . 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 Breast cancer Daughter   . Tuberculosis Father     History   Social History  . Marital Status: Widowed    Spouse Name: N/A  . Number of Children: N/A  . Years of Education: N/A   Social History Main Topics  . Smoking status: Former Smoker -- 1.00 packs/day for 30 years    Types: Cigarettes    Quit date: 06/15/2010  . Smokeless tobacco: Never Used     Comment: 2-3 cigs per week/// 06/30/12  . Alcohol Use: Yes     Comment: socially  . Drug Use: No  . Sexual Activity: Not on file   Other Topics Concern  . None   Social History Narrative   Widowed   5 hours of sleep   Has friends checking on her   No Pets    Outpatient Prescriptions Prior to Visit  Medication Sig Dispense Refill  . amLODipine (NORVASC) 10 MG tablet Take 1 tablet (10 mg total) by mouth daily. 90 tablet 3  . aspirin 81 MG EC tablet Take 81 mg by mouth daily.      . carboxymethylcellulose (REFRESH PLUS) 0.5 % SOLN Place 1 drop into both eyes 4 (four) times daily.    . Cholecalciferol (VITAMIN D) 1000 UNITS capsule Take 1,000 Units by mouth daily.      . fish oil-omega-3 fatty acids 1000 MG capsule Take 1 g by mouth daily.     . isosorbide mononitrate (IMDUR) 30 MG 24 hr tablet Take  1 tablet (30 mg total) by mouth daily. 90 tablet 3  . levalbuterol (XOPENEX HFA) 45 MCG/ACT inhaler INHALE TWO PUFFS EVERY 6 HOURS AS NEEDED FOR WHEEZING 15 g 6  . nitroGLYCERIN (NITROSTAT) 0.4 MG SL tablet Place 1 tablet (0.4 mg total) under the tongue every 5 (five) minutes as needed. 25 tablet 3  . tiotropium (SPIRIVA) 18 MCG inhalation capsule Place 1 capsule (18 mcg total) into inhaler and inhale daily. 30 capsule 6  . traMADol (ULTRAM) 50 MG tablet TAKE ONE TABLET BY MOUTH EVERY EIGHT HOURS AS NEEDED FOR PAIN 40 tablet 0  . doxycycline (VIBRA-TABS) 100 MG tablet Take 1 tablet (100 mg total) by mouth 2 (two) times daily. 14 tablet 0   No  facility-administered medications prior to visit.     EXAM:  BP 156/60 mmHg  Pulse 67  Temp(Src) 97.9 F (36.6 C) (Oral)  Ht 5' 2.5" (1.588 m)  Wt 100 lb 12.8 oz (45.723 kg)  BMI 18.13 kg/m2  SpO2 97%  Body mass index is 18.13 kg/(m^2).  GENERAL: vitals reviewed and listed above, alert, oriented,slender  Non toxic  appears well hydrated and in no acute distress nl speech  HEENT: atraumatic, conjunctiva  clear, no obvious abnormalities on inspection of external nose and ears NECK: no obvious masses on inspection palpation  LUNGS:diffuse rhonchi good  No rakes acute wheezes   bs = CV: HRRR, no clubbing cyanosis or  peripheral edema nl cap refill   MS: moves all extremities without noticeable focal  UE pulses present  Nl temperature to touch   Nl rom  Points to scalula and arm as area of concern  PSYCH: pleasant and cooperative, no obvious depression or anxiety Lab Results  Component Value Date   WBC 12.0* 08/02/2014   HGB 13.2 08/02/2014   HCT 40.2 08/02/2014   PLT 142.0* 08/02/2014   GLUCOSE 103* 08/02/2014   CHOL 160 08/02/2014   TRIG 57.0 08/02/2014   HDL 64.70 08/02/2014   LDLCALC 84 08/02/2014   ALT 33 08/02/2014   AST 51* 08/02/2014   NA 140 08/02/2014   K 3.4* 08/02/2014   CL 104 08/02/2014   CREATININE 1.04 08/02/2014   BUN 17 08/02/2014   CO2 29 08/02/2014   TSH 2.04 08/02/2014   INR 1.04 09/20/2010   HGBA1C 6.0 08/15/2012   Wt Readings from Last 3 Encounters:  08/12/14 100 lb 12.8 oz (45.723 kg)  08/02/14 99 lb 14.4 oz (45.314 kg)  06/25/14 102 lb (46.267 kg)   BP Readings from Last 3 Encounters:  08/12/14 156/60  08/02/14 110/50  06/25/14 118/78     ASSESSMENT AND PLAN:  Discussed the following assessment and plan:  Numbness and tingling in left arm - ? if radicular  have cards address the sx  as to poss vascular causes      Decreased energy - major complaint  uncertain what has changed from baseline excpe interventing  rep infection and left  arm shoulder sx   Respiratory illness with fever - better  Abnormal LFTs - poss from illness  Essential hypertension - up today  Urinary frequency - check ua today - Plan: POCT urinalysis dipstick, Culture, Urine  Underweight  -Patient advised to return or notify health care team  if symptoms worsen ,persist or new concerns arise.  Patient Instructions   Could be many causes of fatigue .including age   We will look for correctable causes of fatigue.   The numbness  Could be from a pinched  nerve in the spine    In the neck  If need more evaluation may advise see Dr  neurology or  Possible orthopedics   Your potassium is slightly low  unknown issue.  Could add to  Fatigue  .  Check urine today .   The liver test are slightly abnormal but could be from the illness you had   Increase  Potassium rich foods at this time   Then plan recheck BMP and magnesium level    And LFTS in 3-4 weeks or as needed    Possible adding to fatigue   return office visit in 2 months or as needed .    Potassium Content of Foods Potassium is a mineral found in many foods and drinks. It helps keep fluids and minerals balanced in your body and affects how steadily your heart beats. Potassium also helps control your blood pressure and keep your muscles and nervous system healthy. Certain health conditions and medicines may change the balance of potassium in your body. When this happens, you can help balance your level of potassium through the foods that you do or do not eat. Your health care provider or dietitian may recommend an amount of potassium that you should have each day. The following lists of foods provide the amount of potassium (in parentheses) per serving in each item. HIGH IN POTASSIUM  The following foods and beverages have 200 mg or more of potassium per serving:  Apricots, 2 raw or 5 dry (200 mg).  Artichoke, 1 medium (345 mg).  Avocado, raw,  each (245 mg).  Banana, 1 medium (425  mg).  Beans, lima, or baked beans, canned,  cup (280 mg).  Beans, white, canned,  cup (595 mg).  Beef roast, 3 oz (320 mg).  Beef, ground, 3 oz (270 mg).  Beets, raw or cooked,  cup (260 mg).  Bran muffin, 2 oz (300 mg).  Broccoli,  cup (230 mg).  Brussels sprouts,  cup (250 mg).  Cantaloupe,  cup (215 mg).  Cereal, 100% bran,  cup (200-400 mg).  Cheeseburger, single, fast food, 1 each (225-400 mg).  Chicken, 3 oz (220 mg).  Clams, canned, 3 oz (535 mg).  Crab, 3 oz (225 mg).  Dates, 5 each (270 mg).  Dried beans and peas,  cup (300-475 mg).  Figs, dried, 2 each (260 mg).  Fish: halibut, tuna, cod, snapper, 3 oz (480 mg).  Fish: salmon, haddock, swordfish, perch, 3 oz (300 mg).  Fish, tuna, canned 3 oz (200 mg).  [place] fries, fast food, 3 oz (470 mg).  Granola with fruit and nuts,  cup (200 mg).  Grapefruit juice,  cup (200 mg).  Greens, beet,  cup (655 mg).  Honeydew melon,  cup (200 mg).  Kale, raw, 1 cup (300 mg).  Kiwi, 1 medium (240 mg).  Kohlrabi, rutabaga, parsnips,  cup (280 mg).  Lentils,  cup (365 mg).  Mango, 1 each (325 mg).  Milk, chocolate, 1 cup (420 mg).  Milk: nonfat, low-fat, whole, buttermilk, 1 cup (350-380 mg).  Molasses, 1 Tbsp (295 mg).  Mushrooms,  cup (280) mg.  Nectarine, 1 each (275 mg).  Nuts: almonds, peanuts, hazelnuts, [place], cashew, mixed, 1 oz (200 mg).  Nuts, pistachios, 1 oz (295 mg).  Orange, 1 each (240 mg).  Orange juice,  cup (235 mg).  Papaya, medium,  fruit (390 mg).  Peanut butter, chunky, 2 Tbsp (240 mg).  Peanut butter, smooth, 2 Tbsp (210 mg).  Pear, 1 medium (  200 mg).  Pomegranate, 1 whole (400 mg).  Pomegranate juice,  cup (215 mg).  Pork, 3 oz (350 mg).  Potato chips, salted, 1 oz (465 mg).  Potato, baked with skin, 1 medium (925 mg).  Potatoes, boiled,  cup (255 mg).  Potatoes, mashed,  cup (330 mg).  Prune juice,  cup (370 mg).  Prunes, 5  each (305 mg).  Pudding, chocolate,  cup (230 mg).  Pumpkin, canned,  cup (250 mg).  Raisins, seedless,  cup (270 mg).  Seeds, sunflower or pumpkin, 1 oz (240 mg).  Soy milk, 1 cup (300 mg).  Spinach,  cup (420 mg).  Spinach, canned,  cup (370 mg).  Sweet potato, baked with skin, 1 medium (450 mg).  Swiss chard,  cup (480 mg).  Tomato or vegetable juice,  cup (275 mg).  Tomato sauce or puree,  cup (400-550 mg).  Tomato, raw, 1 medium (290 mg).  Tomatoes, canned,  cup (200-300 mg).  [place], 3 oz (250 mg).  Wheat germ, 1 oz (250 mg).  Winter squash,  cup (250 mg).  Yogurt, plain or fruited, 6 oz (260-435 mg).  Zucchini,  cup (220 mg). MODERATE IN POTASSIUM The following foods and beverages have 50-200 mg of potassium per serving:  Apple, 1 each (150 mg).  Apple juice,  cup (150 mg).  Applesauce,  cup (90 mg).  Apricot nectar,  cup (140 mg).  Asparagus, small spears,  cup or 6 spears (155 mg).  Bagel, cinnamon raisin, 1 each (130 mg).  Bagel, egg or plain, 4 in., 1 each (70 mg).  Beans, green,  cup (90 mg).  Beans, yellow,  cup (190 mg).  Beer, regular, 12 oz (100 mg).  Beets, canned,  cup (125 mg).  Blackberries,  cup (115 mg).  Blueberries,  cup (60 mg).  Bread, whole wheat, 1 slice (70 mg).  Broccoli, raw,  cup (145 mg).  Cabbage,  cup (150 mg).  Carrots, cooked or raw,  cup (180 mg).  Cauliflower, raw,  cup (150 mg).  Celery, raw,  cup (155 mg).  Cereal, bran flakes, cup (120-150 mg).  Cheese, cottage,  cup (110 mg).  Cherries, 10 each (150 mg).  Chocolate, 1 oz bar (165 mg).  Coffee, brewed 6 oz (90 mg).  Corn,  cup or 1 ear (195 mg).  Cucumbers,  cup (80 mg).  Egg, large, 1 each (60 mg).  Eggplant,  cup (60 mg).  Endive, raw, cup (80 mg).  English muffin, 1 each (65 mg).  Fish, orange roughy, 3 oz (150 mg).  Frankfurter, beef or pork, 1 each (75 mg).  Fruit cocktail,  cup (115  mg).  Grape juice,  cup (170 mg).  Grapefruit,  fruit (175 mg).  Grapes,  cup (155 mg).  Greens: kale, turnip, collard,  cup (110-150 mg).  Ice cream or frozen yogurt, chocolate,  cup (175 mg).  Ice cream or frozen yogurt, vanilla,  cup (120-150 mg).  Lemons, limes, 1 each (80 mg).  Lettuce, all types, 1 cup (100 mg).  Mixed vegetables,  cup (150 mg).  Mushrooms, raw,  cup (110 mg).  Nuts: walnuts, pecans, or macadamia, 1 oz (125 mg).  Oatmeal,  cup (80 mg).  Okra,  cup (110 mg).  Onions, raw,  cup (120 mg).  Peach, 1 each (185 mg).  Peaches, canned,  cup (120 mg).  Pears, canned,  cup (120 mg).  Peas, green, frozen,  cup (90 mg).  Peppers, green,  cup (130 mg).  Peppers,  red,  cup (160 mg).  Pineapple juice,  cup (165 mg).  Pineapple, fresh or canned,  cup (100 mg).  Plums, 1 each (105 mg).  Pudding, vanilla,  cup (150 mg).  Raspberries,  cup (90 mg).  Rhubarb,  cup (115 mg).  Rice, wild,  cup (80 mg).  Shrimp, 3 oz (155 mg).  Spinach, raw, 1 cup (170 mg).  Strawberries,  cup (125 mg).  Summer squash  cup (175-200 mg).  Swiss chard, raw, 1 cup (135 mg).  Tangerines, 1 each (140 mg).  Tea, brewed, 6 oz (65 mg).  Turnips,  cup (140 mg).  Watermelon,  cup (85 mg).  Wine, red, table, 5 oz (180 mg).  Wine, white, table, 5 oz (100 mg). LOW IN POTASSIUM The following foods and beverages have less than 50 mg of potassium per serving.  Bread, white, 1 slice (30 mg).  Carbonated beverages, 12 oz (less than 5 mg).  Cheese, 1 oz (20-30 mg).  Cranberries,  cup (45 mg).  Cranberry juice cocktail,  cup (20 mg).  Fats and oils, 1 Tbsp (less than 5 mg).  Hummus, 1 Tbsp (32 mg).  Nectar: papaya, mango, or pear,  cup (35 mg).  Rice, white or brown,  cup (50 mg).  Spaghetti or macaroni,  cup cooked (30 mg).  Tortilla, flour or corn, 1 each (50 mg).  Waffle, 4 in., 1 each (50 mg).  Water chestnuts,   cup (40 mg). Document Released: 09/01/2004 Document Revised: 01/23/2013 Document Reviewed: 12/15/2012 [facility] Patient Information 2015 [place], [place]. This information is not intended to replace advice given to you by your health care provider. Make sure you discuss any questions you have with your health care provider.      Standley Brooking. Keniel Ralston M.D.

## 2014-08-14 LAB — URINE CULTURE
Colony Count: NO GROWTH
Organism ID, Bacteria: NO GROWTH

## 2014-08-16 ENCOUNTER — Ambulatory Visit: Payer: Medicare HMO | Admitting: Pulmonary Disease

## 2014-08-18 NOTE — Progress Notes (Signed)
HPI: FU coronary artery disease, status post stenting of the right coronary artery in December 2005. Patient had cardiac catheterization in November of 2011 secondary to chest pain which revealed left main normal. The LAD had proximal luminal irregularities. First diagonal was large with ostial 25% stenosis. Circumflex in the AV groove had mid long 25% stenosis. Obtuse marginal was large and normal. The right coronary artery with long in-stent 30% stenosis. PDA was moderate size and normal. Posterolateral was small and normal. EF 65. Echo in June of 2012 showed normal LV function, mild AI and MR. Last renal Dopplers were in August 2010. Renal arteries showed no stenosis. There was a right renal mass. However dedicated ultrasound showed a renal cyst. She also has a history of cerebrovascular disease. Her most recent carotid Dopplers were performed in Sept 2014 showed a 40-59% bilateral stenosis. Followup was recommended in 2 years. She also has peripheral vascular disease and has seen Dr. Burt Knack. An arteriogram revealed occlusion of the left SFA. It was unfavorable for intervention and medical therapy was recommended if possible. ABIs in May 2016 showed moderate decrease bilaterally. Patient is being treated medically. Since I last saw her, she denies increased dyspnea, chest pain or syncope.  Current Outpatient Prescriptions  Medication Sig Dispense Refill  . amLODipine (NORVASC) 10 MG tablet Take 1 tablet (10 mg total) by mouth daily. 90 tablet 3  . aspirin 81 MG EC tablet Take 81 mg by mouth daily.      . carboxymethylcellulose (REFRESH PLUS) 0.5 % SOLN Place 1 drop into both eyes 4 (four) times daily.    . Cholecalciferol (VITAMIN D) 1000 UNITS capsule Take 1,000 Units by mouth daily.      . fish oil-omega-3 fatty acids 1000 MG capsule Take 1 g by mouth daily.     . isosorbide mononitrate (IMDUR) 30 MG 24 hr tablet Take 1 tablet (30 mg total) by mouth daily. 90 tablet 3  . levalbuterol  (XOPENEX HFA) 45 MCG/ACT inhaler INHALE TWO PUFFS EVERY 6 HOURS AS NEEDED FOR WHEEZING 15 g 6  . nitroGLYCERIN (NITROSTAT) 0.4 MG SL tablet Place 1 tablet (0.4 mg total) under the tongue every 5 (five) minutes as needed. 25 tablet 3  . tiotropium (SPIRIVA) 18 MCG inhalation capsule Place 1 capsule (18 mcg total) into inhaler and inhale daily. 30 capsule 6  . traMADol (ULTRAM) 50 MG tablet TAKE ONE TABLET BY MOUTH EVERY EIGHT HOURS AS NEEDED FOR PAIN 40 tablet 0   No current facility-administered medications for this visit.     Past Medical History  Diagnosis Date  . POSTHERPETIC NEURALGIA 08/25/2006  . CARCINOMA, BASAL CELL 02/13/2006  . NEOPLASM, SKIN, UNCERTAIN BEHAVIOR 92/42/6834  . HYPOTHYROIDISM 01/30/2007  . Unspecified vitamin D deficiency 02/07/2007  . HYPERLIPIDEMIA 03/10/2008  . UNSPECIFIED ANEMIA 03/18/2008  . THROMBOCYTOPENIA 07/30/2008  . LOSS, HEARING NOS 08/25/2006  . HYPERTENSION 02/13/2006  . UNSTABLE ANGINA 12/12/2009  . CORONARY ARTERY DISEASE 02/13/2006  . CAROTID ARTERY DISEASE 10/03/2006  . RENAL ARTERY STENOSIS 08/01/2008  . PERIPHERAL VASCULAR DISEASE 02/13/2006  . Chronic rhinitis 12/13/2006  . CHRONIC OBSTRUCTIVE PULMONARY DISEASE, ACUTE EXACERBATION 11/17/2009  . DERMATITIS 09/25/2007  . DEGENERATIVE JOINT DISEASE 09/27/2006  . OSTEOARTHRITIS, HAND 12/16/2008  . SPINAL STENOSIS 11/01/2006  . LIVER FUNCTION TESTS, ABNORMAL 01/22/2009  . UNS ADVRS EFF UNS RX MEDICINAL&BIOLOGICAL SBSTNC 01/30/2007  . Personal history of malignant neoplasm of breast 07/24/2008  . NEPHROLITHIASIS, HX OF 02/27/2010  . Vitreous detachment   .  Shingles     recurrent  . Head trauma     closed  . Personal history of urinary calculi   . Post-traumatic wound infection 12/01/2010    Mild streaking superiorly and on one day of septra  Will give rocephin and add keflex  adn follow up     Past Surgical History  Procedure Laterality Date  . Lumbar laminectomy    . Cataract extraction    .  Abdominal hysterectomy    . Breast lumpectomy    . Tonsillectomy    . Coronary angioplasty with stent placement      bilat iliac stents, left renal artery and rt coronary artery last myoview 8/09 with EF 70%  . Skin cancer excision    . Breast lumpectomy  1998    History   Social History  . Marital Status: Widowed    Spouse Name: N/A  . Number of Children: N/A  . Years of Education: N/A   Occupational History  . Not on file.   Social History Main Topics  . Smoking status: Former Smoker -- 1.00 packs/day for 30 years    Types: Cigarettes    Quit date: 06/15/2010  . Smokeless tobacco: Never Used     Comment: 2-3 cigs per week/// 06/30/12  . Alcohol Use: Yes     Comment: socially  . Drug Use: No  . Sexual Activity: Not on file   Other Topics Concern  . Not on file   Social History Narrative   Widowed   5 hours of sleep   Has friends checking on her   No Pets    ROS: low back pain and bilateral leg weakness but no fevers or chills, productive cough, hemoptysis, dysphasia, odynophagia, melena, hematochezia, dysuria, hematuria, rash, seizure activity, orthopnea, PND, pedal edema, claudication. Remaining systems are negative.  Physical Exam: Well-developed frail in no acute distress.  Skin is warm and dry.  HEENT is normal.  Neck is supple.  Chest with mild expiratory wheeze Cardiovascular exam is regular rate and rhythm.  Abdominal exam nontender or distended. No masses palpated. Extremities show no edema. neuro grossly intact  ECG sinus rhythm with PACs. Septal infarct. Nonspecific ST changes.

## 2014-08-19 ENCOUNTER — Ambulatory Visit (INDEPENDENT_AMBULATORY_CARE_PROVIDER_SITE_OTHER): Payer: Medicare HMO | Admitting: Cardiology

## 2014-08-19 ENCOUNTER — Encounter: Payer: Self-pay | Admitting: Cardiology

## 2014-08-19 VITALS — BP 140/50 | HR 72 | Ht 64.0 in | Wt 102.3 lb

## 2014-08-19 DIAGNOSIS — I2510 Atherosclerotic heart disease of native coronary artery without angina pectoris: Secondary | ICD-10-CM

## 2014-08-19 NOTE — Assessment & Plan Note (Signed)
Continue aspirin. Intolerant to statins. 

## 2014-08-19 NOTE — Assessment & Plan Note (Signed)
Continue aspirin. Followed by Dr. Burt Knack.

## 2014-08-19 NOTE — Assessment & Plan Note (Signed)
Continue diet. Intolerant to statins. 

## 2014-08-19 NOTE — Assessment & Plan Note (Signed)
Patient counseled on discontinuing. 

## 2014-08-19 NOTE — Assessment & Plan Note (Signed)
Blood pressure controlled. Continue present medications. 

## 2014-08-19 NOTE — Patient Instructions (Signed)
Your physician wants you to follow-up in: ONE YEAR WITH DR CRENSHAW You will receive a reminder letter in the mail two months in advance. If you don't receive a letter, please call our office to schedule the follow-up appointment.  

## 2014-08-19 NOTE — Assessment & Plan Note (Signed)
Follow-up carotid Doppler September 2016.

## 2014-08-19 NOTE — Assessment & Plan Note (Signed)
Continue aspirin 

## 2014-08-23 ENCOUNTER — Other Ambulatory Visit: Payer: Self-pay | Admitting: Family Medicine

## 2014-08-23 DIAGNOSIS — R8290 Unspecified abnormal findings in urine: Secondary | ICD-10-CM

## 2014-08-23 DIAGNOSIS — R319 Hematuria, unspecified: Secondary | ICD-10-CM

## 2014-08-23 NOTE — Addendum Note (Signed)
Addended by: Miles Costain T on: 08/23/2014 03:26 PM   Modules accepted: Orders

## 2014-08-26 ENCOUNTER — Other Ambulatory Visit (INDEPENDENT_AMBULATORY_CARE_PROVIDER_SITE_OTHER): Payer: Medicare HMO

## 2014-08-26 DIAGNOSIS — R319 Hematuria, unspecified: Secondary | ICD-10-CM | POA: Diagnosis not present

## 2014-08-26 DIAGNOSIS — R8290 Unspecified abnormal findings in urine: Secondary | ICD-10-CM

## 2014-08-26 LAB — URINALYSIS, MICROSCOPIC ONLY

## 2014-09-02 ENCOUNTER — Other Ambulatory Visit (INDEPENDENT_AMBULATORY_CARE_PROVIDER_SITE_OTHER): Payer: Medicare HMO

## 2014-09-02 DIAGNOSIS — I701 Atherosclerosis of renal artery: Secondary | ICD-10-CM | POA: Diagnosis not present

## 2014-09-02 DIAGNOSIS — I10 Essential (primary) hypertension: Secondary | ICD-10-CM

## 2014-09-02 LAB — BASIC METABOLIC PANEL
BUN: 21 mg/dL (ref 6–23)
CO2: 29 mEq/L (ref 19–32)
CREATININE: 0.93 mg/dL (ref 0.40–1.20)
Calcium: 9.4 mg/dL (ref 8.4–10.5)
Chloride: 105 mEq/L (ref 96–112)
GFR: 60.93 mL/min (ref >60.00)
Glucose, Bld: 91 mg/dL (ref 70–99)
Potassium: 3.5 mEq/L (ref 3.5–5.1)
Sodium: 142 mEq/L (ref 135–145)

## 2014-09-02 LAB — HEPATIC FUNCTION PANEL
ALT: 40 U/L — ABNORMAL HIGH (ref 0–35)
AST: 53 U/L — AB (ref 0–37)
Albumin: 3.9 g/dL (ref 3.5–5.2)
Alkaline Phosphatase: 112 U/L (ref 39–117)
BILIRUBIN DIRECT: 0.1 mg/dL (ref 0.0–0.3)
Total Bilirubin: 0.6 mg/dL (ref 0.2–1.2)
Total Protein: 6.6 g/dL (ref 6.0–8.3)

## 2014-09-02 LAB — MAGNESIUM: MAGNESIUM: 2 mg/dL (ref 1.5–2.5)

## 2014-09-13 ENCOUNTER — Telehealth: Payer: Self-pay | Admitting: Internal Medicine

## 2014-09-13 NOTE — Telephone Encounter (Signed)
Pt would like blood work results . Pt is aware md out of office until tuesday

## 2014-09-16 NOTE — Telephone Encounter (Signed)
Please see result note  For advice and review

## 2014-09-17 NOTE — Telephone Encounter (Signed)
Noted  

## 2014-09-18 ENCOUNTER — Other Ambulatory Visit: Payer: Self-pay | Admitting: Family Medicine

## 2014-09-18 ENCOUNTER — Encounter: Payer: Self-pay | Admitting: Neurology

## 2014-09-18 ENCOUNTER — Ambulatory Visit (INDEPENDENT_AMBULATORY_CARE_PROVIDER_SITE_OTHER): Payer: Medicare HMO | Admitting: Neurology

## 2014-09-18 VITALS — BP 176/86 | HR 72 | Ht 64.0 in | Wt 101.8 lb

## 2014-09-18 DIAGNOSIS — R7989 Other specified abnormal findings of blood chemistry: Secondary | ICD-10-CM

## 2014-09-18 DIAGNOSIS — R51 Headache: Secondary | ICD-10-CM | POA: Diagnosis not present

## 2014-09-18 DIAGNOSIS — R945 Abnormal results of liver function studies: Secondary | ICD-10-CM

## 2014-09-18 DIAGNOSIS — R55 Syncope and collapse: Secondary | ICD-10-CM | POA: Diagnosis not present

## 2014-09-18 DIAGNOSIS — R634 Abnormal weight loss: Secondary | ICD-10-CM

## 2014-09-18 DIAGNOSIS — R519 Headache, unspecified: Secondary | ICD-10-CM

## 2014-09-18 HISTORY — DX: Syncope and collapse: R55

## 2014-09-18 NOTE — Progress Notes (Addendum)
Reason for visit: Near syncope  Referring physician: Dr. Rhae Hammock is a 81 y.o. female  History of present illness:  Ms. Montecalvo is an 81 year old right-handed white female with a history of hypertension, coronary artery disease, peripheral vascular disease, and tobacco abuse. The patient indicates that approximately 2 weeks prior to this evaluation she was sitting at home at her desk shredding papers. She had a sudden onset of visual loss that was unassociated with loss of consciousness. The patient remained sitting, she tried to reach for the telephone, but she was unable to get the phone. Eventually, the vision returned, but the patient has had onset of headache with the event that has been persistent. She has the headache mainly in the temporal regions, she indicates that she was not having headaches prior to the onset of the near syncope. She denies any new numbness or weakness of the face, arms, or legs. She does have chronic neck discomfort and pain into the left intrascapular area, but the neck discomfort has increased since this near syncopal episode. The patient has not had any confusion or memory loss. The headache is relatively mild, unassociated with nausea or vomiting. She does have chronic low back pain, and some gait instability. This has not changed. She has not had any permanent visual changes, double vision, or any recurrence of loss of vision. She denied any chest pain, chest pressure, or palpitations of the heart around the time of the near syncope. The patient is sent to this office for an evaluation.  Past Medical History  Diagnosis Date  . POSTHERPETIC NEURALGIA 08/25/2006  . CARCINOMA, BASAL CELL 02/13/2006  . NEOPLASM, SKIN, UNCERTAIN BEHAVIOR 28/31/5176  . HYPOTHYROIDISM 01/30/2007  . Unspecified vitamin D deficiency 02/07/2007  . HYPERLIPIDEMIA 03/10/2008  . UNSPECIFIED ANEMIA 03/18/2008  . THROMBOCYTOPENIA 07/30/2008  . LOSS, HEARING NOS 08/25/2006    . HYPERTENSION 02/13/2006  . UNSTABLE ANGINA 12/12/2009  . CORONARY ARTERY DISEASE 02/13/2006  . CAROTID ARTERY DISEASE 10/03/2006  . RENAL ARTERY STENOSIS 08/01/2008  . PERIPHERAL VASCULAR DISEASE 02/13/2006  . Chronic rhinitis 12/13/2006  . CHRONIC OBSTRUCTIVE PULMONARY DISEASE, ACUTE EXACERBATION 11/17/2009  . DERMATITIS 09/25/2007  . DEGENERATIVE JOINT DISEASE 09/27/2006  . OSTEOARTHRITIS, HAND 12/16/2008  . SPINAL STENOSIS 11/01/2006  . LIVER FUNCTION TESTS, ABNORMAL 01/22/2009  . UNS ADVRS EFF UNS RX MEDICINAL&BIOLOGICAL SBSTNC 01/30/2007  . Personal history of malignant neoplasm of breast 07/24/2008  . NEPHROLITHIASIS, HX OF 02/27/2010  . Vitreous detachment   . Shingles     recurrent  . Head trauma     closed  . Personal history of urinary calculi   . Post-traumatic wound infection 12/01/2010    Mild streaking superiorly and on one day of septra  Will give rocephin and add keflex  adn follow up   . Near syncope 09/18/2014    Past Surgical History  Procedure Laterality Date  . Lumbar laminectomy    . Cataract extraction    . Abdominal hysterectomy    . Breast lumpectomy    . Tonsillectomy    . Coronary angioplasty with stent placement      bilat iliac stents, left renal artery and rt coronary artery last myoview 8/09 with EF 70%  . Skin cancer excision    . Breast lumpectomy  1998    Family History  Problem Relation Age of Onset  . Breast cancer Daughter   . Tuberculosis Father     Social history:  reports that she quit  smoking about 4 years ago. Her smoking use included Cigarettes. She has a 30 pack-year smoking history. She has never used smokeless tobacco. She reports that she does not drink alcohol or use illicit drugs.  Medications:  Prior to Admission medications   Medication Sig Start Date End Date Taking? Authorizing Provider  amLODipine (NORVASC) 10 MG tablet Take 1 tablet (10 mg total) by mouth daily. 02/19/14  Yes Sherren Mocha, MD  aspirin 81 MG EC tablet  Take 81 mg by mouth daily.     Yes Historical Provider, MD  carboxymethylcellulose (REFRESH PLUS) 0.5 % SOLN Place 1 drop into both eyes 4 (four) times daily.   Yes Historical Provider, MD  Cholecalciferol (VITAMIN D) 1000 UNITS capsule Take 1,000 Units by mouth daily.     Yes Historical Provider, MD  fish oil-omega-3 fatty acids 1000 MG capsule Take 1 g by mouth daily.    Yes Historical Provider, MD  isosorbide mononitrate (IMDUR) 30 MG 24 hr tablet Take 1 tablet (30 mg total) by mouth daily. 08/16/13  Yes Lelon Perla, MD  levalbuterol (XOPENEX HFA) 45 MCG/ACT inhaler INHALE TWO PUFFS EVERY 6 HOURS AS NEEDED FOR WHEEZING 10/03/13  Yes Tammy S Parrett, NP  nitroGLYCERIN (NITROSTAT) 0.4 MG SL tablet Place 1 tablet (0.4 mg total) under the tongue every 5 (five) minutes as needed. 08/07/12  Yes Lelon Perla, MD  tiotropium (SPIRIVA) 18 MCG inhalation capsule Place 1 capsule (18 mcg total) into inhaler and inhale daily. 07/24/14  Yes Tammy S Parrett, NP  traMADol (ULTRAM) 50 MG tablet TAKE ONE TABLET BY MOUTH EVERY EIGHT HOURS AS NEEDED FOR PAIN 06/28/12  Yes Burnis Medin, MD      Allergies  Allergen Reactions  . Statins Other (See Comments)    Elevated liver enzymes  . Oxycodone Anxiety  . Tequin Other (See Comments)    Patient doesn't recall    ROS:  Out of a complete 14 system review of symptoms, the patient complains only of the following symptoms, and all other reviewed systems are negative.  Fatigue Headache, numbness, weakness, near-syncope Gait disorder  Blood pressure 176/86, pulse 72, height 5\' 4"  (1.626 m), weight 101 lb 12.8 oz (46.176 kg).  Physical Exam  General: The patient is alert and cooperative at the time of the examination.  Eyes: Pupils are equal, round, and reactive to light. Discs are flat bilaterally.  Neck: The neck is supple, no carotid bruits are noted.  Respiratory: The respiratory examination is notable for bilateral rhonchi and  wheezes.  Cardiovascular: The cardiovascular examination reveals a regular rate and rhythm, no obvious murmurs or rubs are noted.  Skin: Extremities are without significant edema.  Neurologic Exam  Mental status: The patient is alert and oriented x 3 at the time of the examination. The patient has apparent normal recent and remote memory, with an apparently normal attention span and concentration ability.  Cranial nerves: Facial symmetry is present. There is good sensation of the face to pinprick and soft touch bilaterally. The strength of the facial muscles and the muscles to head turning and shoulder shrug are normal bilaterally. Speech is well enunciated, no aphasia or dysarthria is noted. Extraocular movements are full. Visual fields are full. The tongue is midline, and the patient has symmetric elevation of the soft palate. No obvious hearing deficits are noted.  Motor: The motor testing reveals 5 over 5 strength of all 4 extremities, with exception of 4/5 strength proximally in both legs. Good symmetric motor  tone is noted throughout.  Sensory: Sensory testing is intact to pinprick, soft touch, vibration sensation, and position sense on all 4 extremities, with exception of some decrease in vibration sensation on the left foot. No evidence of extinction is noted.  Coordination: Cerebellar testing reveals good finger-nose-finger and heel-to-shin bilaterally.  Gait and station: Gait is slightly wide-based, unsteady. Tandem gait was not attempted. Romberg is negative. No drift is seen.  Reflexes: Deep tendon reflexes are symmetric in the arms, decreased reflex in the left knee jerk relative to the right, decreased ankle jerk reflexes are seen bilaterally. Toes are downgoing bilaterally.   Assessment/Plan:  1. Near syncope  2. Headache, new onset  The patient has multiple risk factors for cerebrovascular disease. The patient reports onset of transient visual loss and onset of headache 2  weeks ago. The patient will require a cerebrovascular workup. The episode could be consistent with an aneurysmal rupture. MRI of the brain will be done, and MRA of the head will be done. The patient will undergo a carotid Doppler study. A sedimentation rate will be done today. I will follow-up with the patient concerning the above results.  Jill Alexanders MD 09/18/2014 8:37 PM  [facility] [address] [place],  [address]  Phone [phone] Fax [phone]

## 2014-09-18 NOTE — Patient Instructions (Addendum)
   We will set you up for MRI evaluation of the brain, and do MRA of the head to look at the circulation to the head, along with a carotid Doppler study. We will check blood work today.   Near-Syncope Near-syncope (commonly known as near fainting) is sudden weakness, dizziness, or feeling like you might pass out. During an episode of near-syncope, you may also develop pale skin, have tunnel vision, or feel sick to your stomach (nauseous). Near-syncope may occur when getting up after sitting or while standing for a long time. It is caused by a sudden decrease in blood flow to the brain. This decrease can result from various causes or triggers, most of which are not serious. However, because near-syncope can sometimes be a sign of something serious, a medical evaluation is required. The specific cause is often not determined. HOME CARE INSTRUCTIONS  Monitor your condition for any changes. The following actions may help to alleviate any discomfort you are experiencing:  Have someone stay with you until you feel stable.  Lie down right away and prop your feet up if you start feeling like you might faint. Breathe deeply and steadily. Wait until all the symptoms have passed. Most of these episodes last only a few minutes. You may feel tired for several hours.   Drink enough fluids to keep your urine clear or pale yellow.   If you are taking blood pressure or heart medicine, get up slowly when seated or lying down. Take several minutes to sit and then stand. This can reduce dizziness.  Follow up with your health care provider as directed. SEEK IMMEDIATE MEDICAL CARE IF:   You have a severe headache.   You have unusual pain in the chest, abdomen, or back.   You are bleeding from the mouth or rectum, or you have black or tarry stool.   You have an irregular or very fast heartbeat.   You have repeated fainting or have seizure-like jerking during an episode.   You faint when sitting or  lying down.   You have confusion.   You have difficulty walking.   You have severe weakness.   You have vision problems.  MAKE SURE YOU:   Understand these instructions.  Will watch your condition.  Will get help right away if you are not doing well or get worse. Document Released: 01/18/2005 Document Revised: 01/23/2013 Document Reviewed: 06/23/2012 [facility] Patient Information 2015 [place], [place]. This information is not intended to replace advice given to you by your health care provider. Make sure you discuss any questions you have with your health care provider.

## 2014-09-19 ENCOUNTER — Telehealth: Payer: Self-pay

## 2014-09-19 ENCOUNTER — Telehealth: Payer: Self-pay | Admitting: Internal Medicine

## 2014-09-19 LAB — SEDIMENTATION RATE: Sed Rate: 4 mm/hr (ref 0–40)

## 2014-09-19 NOTE — Telephone Encounter (Signed)
-----   Message from Kathrynn Ducking, MD sent at 09/19/2014  7:43 AM EDT -----  The blood work results are unremarkable. Please call the patient.  ----- Message -----    From: [facility] Lab Results In Interface    Sent: 09/19/2014   5:44 AM      To: Kathrynn Ducking, MD

## 2014-09-19 NOTE — Telephone Encounter (Signed)
Pt has information you need about her bp. Pt states it is very important. Refused to elaborate.  Pt aware Misty has gone for the afternoon and will be Friday.

## 2014-09-19 NOTE — Telephone Encounter (Signed)
I called the patient and relayed results. 

## 2014-09-20 MED ORDER — METOPROLOL SUCCINATE ER 25 MG PO TB24: 25.0000 mg | ORAL_TABLET | Freq: Every day | ORAL | Status: DC

## 2014-09-20 NOTE — Telephone Encounter (Signed)
Spoke with pt, Aware of dr crenshaw's recommendations. New script sent to the pharmacy  

## 2014-09-20 NOTE — Telephone Encounter (Signed)
Pt states she has been having high bp readings at other doctor's offices.  Today she went and seen Dr. Tonia Brooms (derm) and her bp in right arm was 162/70 and left arm was 140/80.  She is starting to become concerned.  Wants to know if she should come in and see Dr. Regis Bill.

## 2014-09-20 NOTE — Telephone Encounter (Signed)
Per Dr. Regis Bill, will forward message to Dr. Stanford Breed.  Pt was also seen by Dr. Jannifer Franklin on 09/18/14 and bp was 176/86

## 2014-09-20 NOTE — Telephone Encounter (Signed)
Toprol 25 mg daily [name] [name]

## 2014-09-23 ENCOUNTER — Telehealth: Payer: Self-pay | Admitting: Internal Medicine

## 2014-09-23 NOTE — Telephone Encounter (Signed)
Ok to move up labs   To pre visit  Or at visit .   Add lfts to labs  As ordered  Dx abnormal lfts

## 2014-09-23 NOTE — Telephone Encounter (Signed)
OK to move appt.

## 2014-09-23 NOTE — Telephone Encounter (Signed)
Pt has lab appt 10/3, but has appt to see dr Regis Bill on 10/11/14.  Pt is hoping to have appt after labs.  Any way we can make these appts more convenient for pt?

## 2014-09-25 ENCOUNTER — Other Ambulatory Visit: Payer: Self-pay | Admitting: Family Medicine

## 2014-09-25 DIAGNOSIS — R7989 Other specified abnormal findings of blood chemistry: Secondary | ICD-10-CM

## 2014-09-25 DIAGNOSIS — R945 Abnormal results of liver function studies: Secondary | ICD-10-CM

## 2014-09-25 NOTE — Telephone Encounter (Addendum)
Pt aware ok to move lab to same day as appt.  Pt states misty gave her a call and would like you to cb

## 2014-09-25 NOTE — Telephone Encounter (Signed)
I have added lab work.

## 2014-09-25 NOTE — Telephone Encounter (Signed)
Lm on vm to cb °

## 2014-09-26 NOTE — Telephone Encounter (Signed)
Spoke to the pt.  She thought I called her before Juliann Pulse called her.  Pt will call back if needed.

## 2014-09-27 ENCOUNTER — Ambulatory Visit
Admission: RE | Admit: 2014-09-27 | Discharge: 2014-09-27 | Disposition: A | Discharge status: Home / Self Care | Payer: Medicare HMO | Source: Ambulatory Visit | Attending: Neurology | Admitting: Neurology

## 2014-09-27 DIAGNOSIS — R51 Headache: Secondary | ICD-10-CM

## 2014-09-27 DIAGNOSIS — R519 Headache, unspecified: Secondary | ICD-10-CM

## 2014-09-27 DIAGNOSIS — R55 Syncope and collapse: Secondary | ICD-10-CM

## 2014-09-30 ENCOUNTER — Telehealth: Payer: Self-pay | Admitting: Neurology

## 2014-09-30 ENCOUNTER — Ambulatory Visit
Admission: RE | Admit: 2014-09-30 | Discharge: 2014-09-30 | Disposition: A | Discharge status: Home / Self Care | Payer: Medicare HMO | Source: Ambulatory Visit | Attending: Internal Medicine | Admitting: Internal Medicine

## 2014-09-30 ENCOUNTER — Other Ambulatory Visit: Payer: Self-pay | Admitting: *Deleted

## 2014-09-30 DIAGNOSIS — R7989 Other specified abnormal findings of blood chemistry: Secondary | ICD-10-CM

## 2014-09-30 DIAGNOSIS — R634 Abnormal weight loss: Secondary | ICD-10-CM

## 2014-09-30 DIAGNOSIS — R945 Abnormal results of liver function studies: Secondary | ICD-10-CM

## 2014-09-30 MED ORDER — ASPIRIN 325 MG PO TABS: 325.0000 mg | ORAL_TABLET | Freq: Every day | ORAL | Status: DC

## 2014-09-30 MED ORDER — AMLODIPINE BESYLATE 10 MG PO TABS
10.0000 mg | ORAL_TABLET | Freq: Every day | ORAL | Status: DC
Start: 2014-09-30 — End: 2015-11-07

## 2014-09-30 NOTE — Telephone Encounter (Signed)
I called the patient. The MRI of the brain shows no changes from 2012. MRA does show a lot of atherosclerotic changes in the posterior circulation. I will have the patient go to a 325 mg aspirin. The carotid doppler is pending.   MRI head 09/30/14:  IMPRESSION:  Abnormal MRI brain (without) demonstrating: 1. Chronic left occipital and smaller right parietal ischemic infarctions, with encephalomalacia and gliosis. 2. Mild periventricular and subcortical chronic small vessel ischemic disease.  3. Mild diffuse and moderate mesial temporal atrophy. 4. No acute findings. No significant changes from MRI on 05/08/10.   MRA head results 09/30/14:  IMPRESSION:  Abnormal MRA head (without) demonstrating: 1. Multiple focal stenoses noted in the bilateral posterior cerebral arteries. 2. Artifactual decreased signal in the left vertebral artery. The right vertebral and basilar arteries have no stenosis. 3. Anterior circulation is unremarkable.

## 2014-10-02 ENCOUNTER — Telehealth: Payer: Self-pay | Admitting: Cardiology

## 2014-10-02 NOTE — Telephone Encounter (Signed)
Ok for ASA 325 daily Kirk Ruths

## 2014-10-02 NOTE — Telephone Encounter (Signed)
Dr. Jannifer Franklin (neuro) increased her ASA from 81 mg to 325 mg after reviewing her results to her recent MRI  Patient is concerned because years ago (2003?) had problem with Plavix and they had to take her off it.  Worried about the blood thinning effects of going from ASA 81 mg to ASA 325 mg and that it will be too strong

## 2014-10-02 NOTE — Telephone Encounter (Signed)
Spoke with pt, Aware of dr crenshaw's recommendations.  °

## 2014-10-02 NOTE — Telephone Encounter (Signed)
Calling because she has a question about her increase in aspirin from 81mg  to 325mg  and this coming her neurologist( Dr. Jannifer Franklin)  . Please call   Thanks

## 2014-10-03 ENCOUNTER — Ambulatory Visit (INDEPENDENT_AMBULATORY_CARE_PROVIDER_SITE_OTHER): Payer: Medicare HMO

## 2014-10-03 DIAGNOSIS — R55 Syncope and collapse: Secondary | ICD-10-CM

## 2014-10-03 DIAGNOSIS — R51 Headache: Secondary | ICD-10-CM

## 2014-10-03 DIAGNOSIS — R519 Headache, unspecified: Secondary | ICD-10-CM

## 2014-10-11 ENCOUNTER — Other Ambulatory Visit: Payer: Medicare HMO

## 2014-10-11 ENCOUNTER — Encounter: Payer: Self-pay | Admitting: Internal Medicine

## 2014-10-11 ENCOUNTER — Ambulatory Visit (INDEPENDENT_AMBULATORY_CARE_PROVIDER_SITE_OTHER): Payer: Medicare HMO | Admitting: Internal Medicine

## 2014-10-11 VITALS — BP 156/86 | HR 62 | Temp 98.5°F | Ht 62.5 in | Wt 100.0 lb

## 2014-10-11 DIAGNOSIS — Z889 Allergy status to unspecified drugs, medicaments and biological substances status: Secondary | ICD-10-CM | POA: Diagnosis not present

## 2014-10-11 DIAGNOSIS — I7090 Unspecified atherosclerosis: Secondary | ICD-10-CM

## 2014-10-11 DIAGNOSIS — R7989 Other specified abnormal findings of blood chemistry: Secondary | ICD-10-CM | POA: Diagnosis not present

## 2014-10-11 DIAGNOSIS — R5383 Other fatigue: Secondary | ICD-10-CM

## 2014-10-11 DIAGNOSIS — R945 Abnormal results of liver function studies: Secondary | ICD-10-CM

## 2014-10-11 DIAGNOSIS — Z789 Other specified health status: Secondary | ICD-10-CM | POA: Insufficient documentation

## 2014-10-11 DIAGNOSIS — Z23 Encounter for immunization: Secondary | ICD-10-CM

## 2014-10-11 LAB — HEPATIC FUNCTION PANEL
ALK PHOS: 102 U/L (ref 39–117)
ALT: 27 U/L (ref 0–35)
AST: 27 U/L (ref 0–37)
Albumin: 3.8 g/dL (ref 3.5–5.2)
BILIRUBIN DIRECT: 0.1 mg/dL (ref 0.0–0.3)
TOTAL PROTEIN: 6.4 g/dL (ref 6.0–8.3)
Total Bilirubin: 0.5 mg/dL (ref 0.2–1.2)

## 2014-10-11 NOTE — Progress Notes (Signed)
Pre visit review using our clinic review tool, if applicable. No additional management support is needed unless otherwise documented below in the visit note.  Chief Complaint  Patient presents with  . Follow-up    HPI: [name] [name] 81 y.o. comesin for fu of multiple sx  She has vascular disease copd  recetnly arm numbness tingling  And had syncope evaluated but dr Lorelle Formosa showed  ascv changes no acute change  Told to take asa  325 q d   Epidural for spine  Per dr Nelva Bush   Helped pain  So much so doing much better So now back in yard.  Still tobacco 3-4 per day .  Says eating ok  Says has had abd lfts in the past . Had [place] recenetly     ROS: See pertinent positives and negatives per HPI.  Past Medical History  Diagnosis Date  . POSTHERPETIC NEURALGIA 08/25/2006  . CARCINOMA, BASAL CELL 02/13/2006  . NEOPLASM, SKIN, UNCERTAIN BEHAVIOR 24/26/8341  . HYPOTHYROIDISM 01/30/2007  . Unspecified vitamin D deficiency 02/07/2007  . HYPERLIPIDEMIA 03/10/2008  . UNSPECIFIED ANEMIA 03/18/2008  . THROMBOCYTOPENIA 07/30/2008  . LOSS, HEARING NOS 08/25/2006  . HYPERTENSION 02/13/2006  . UNSTABLE ANGINA 12/12/2009  . CORONARY ARTERY DISEASE 02/13/2006  . CAROTID ARTERY DISEASE 10/03/2006  . RENAL ARTERY STENOSIS 08/01/2008  . PERIPHERAL VASCULAR DISEASE 02/13/2006  . Chronic rhinitis 12/13/2006  . CHRONIC OBSTRUCTIVE PULMONARY DISEASE, ACUTE EXACERBATION 11/17/2009  . DERMATITIS 09/25/2007  . DEGENERATIVE JOINT DISEASE 09/27/2006  . OSTEOARTHRITIS, HAND 12/16/2008  . SPINAL STENOSIS 11/01/2006  . LIVER FUNCTION TESTS, ABNORMAL 01/22/2009  . UNS ADVRS EFF UNS RX MEDICINAL&BIOLOGICAL SBSTNC 01/30/2007  . Personal history of malignant neoplasm of breast 07/24/2008  . NEPHROLITHIASIS, HX OF 02/27/2010  . Vitreous detachment   . Shingles     recurrent  . Head trauma     closed  . Personal history of urinary calculi   . Post-traumatic wound infection 12/01/2010    Mild streaking superiorly and  on one day of septra  Will give rocephin and add keflex  adn follow up   . Near syncope 09/18/2014    Family History  Problem Relation Age of Onset  . Breast cancer Daughter   . Tuberculosis Father     Social History   Social History  . Marital Status: Widowed    Spouse Name: N/A  . Number of Children: N/A  . Years of Education: 13   Occupational History  . retired    Social History Main Topics  . Smoking status: Former Smoker -- 1.00 packs/day for 30 years    Types: Cigarettes    Quit date: 06/15/2010  . Smokeless tobacco: Never Used     Comment: 2-3 cigs per week/// 06/30/12  . Alcohol Use: No     Comment: socially  . Drug Use: No  . Sexual Activity: Not Asked   Other Topics Concern  . None   Social History Narrative   Widowed   5 hours of sleep   Has friends checking on her   No Pets      Patient drinks about 6 cups of caffeine daily.   Patient is right handed.    Outpatient Prescriptions Prior to Visit  Medication Sig Dispense Refill  . amLODipine (NORVASC) 10 MG tablet Take 1 tablet (10 mg total) by mouth daily. 90 tablet 2  . aspirin (BAYER ASPIRIN) 325 MG tablet Take 1 tablet (325 mg total) by mouth  daily.    . carboxymethylcellulose (REFRESH PLUS) 0.5 % SOLN Place 1 drop into both eyes 4 (four) times daily.    . Cholecalciferol (VITAMIN D) 1000 UNITS capsule Take 1,000 Units by mouth daily.      . fish oil-omega-3 fatty acids 1000 MG capsule Take 1 g by mouth daily.     . isosorbide mononitrate (IMDUR) 30 MG 24 hr tablet Take 1 tablet (30 mg total) by mouth daily. 90 tablet 3  . levalbuterol (XOPENEX HFA) 45 MCG/ACT inhaler INHALE TWO PUFFS EVERY 6 HOURS AS NEEDED FOR WHEEZING 15 g 6  . metoprolol succinate (TOPROL-XL) 25 MG 24 hr tablet Take 1 tablet (25 mg total) by mouth daily. 30 tablet 12  . nitroGLYCERIN (NITROSTAT) 0.4 MG SL tablet Place 1 tablet (0.4 mg total) under the tongue every 5 (five) minutes as needed. 25 tablet 3  . tiotropium (SPIRIVA)  18 MCG inhalation capsule Place 1 capsule (18 mcg total) into inhaler and inhale daily. 30 capsule 6  . traMADol (ULTRAM) 50 MG tablet TAKE ONE TABLET BY MOUTH EVERY EIGHT HOURS AS NEEDED FOR PAIN 40 tablet 0   No facility-administered medications prior to visit.     EXAM:  BP 156/86 mmHg  Pulse 62  Temp(Src) 98.5 F (36.9 C) (Oral)  Ht 5' 2.5" (1.588 m)  Wt 100 lb (45.36 kg)  BMI 17.99 kg/m2  SpO2 96%  Body mass index is 17.99 kg/(m^2).  GENERAL: vitals reviewed and listed above, alert, oriented, appears well hydrated and in no acute distress slender  Looks much better  HEENT: atraumatic, conjunctiva  clear, no obvious abnormalities on inspection of external nose and ears NECK: no obvious masses on inspection palpation  LUNGS: clear to auscultation bilaterally, no wheezes, rales or rhonchi,  CV: HRRR, no clubbing cyanosis or  peripheral edema nl cap refill  MS: moves all extremities without noticeable focal  Abnormality oa changes  abd soft inc bs no masses or tenderness PSYCH: pleasant and cooperative, no obvious depression or anxiety Lab Results  Component Value Date   WBC 12.0* 08/02/2014   HGB 13.2 08/02/2014   HCT 40.2 08/02/2014   PLT 142.0* 08/02/2014   GLUCOSE 91 09/02/2014   CHOL 160 08/02/2014   TRIG 57.0 08/02/2014   HDL 64.70 08/02/2014   LDLCALC 84 08/02/2014   ALT 27 10/11/2014   AST 27 10/11/2014   NA 142 09/02/2014   K 3.5 09/02/2014   CL 105 09/02/2014   CREATININE 0.93 09/02/2014   BUN 21 09/02/2014   CO2 29 09/02/2014   TSH 2.04 08/02/2014   INR 1.04 09/20/2010   HGBA1C 6.0 08/15/2012   Wt Readings from Last 3 Encounters:  10/11/14 100 lb (45.36 kg)  09/18/14 101 lb 12.8 oz (46.176 kg)  08/19/14 102 lb 4.8 oz (46.403 kg)   BP Readings from Last 3 Encounters:  10/11/14 156/86  09/18/14 176/86  08/19/14 140/50    [place] abd reviewed with pt  ASSESSMENT AND PLAN:  Discussed the following assessment and plan:  Abnormal LFTs - [place] normal  except fer renal cysts simple most likely - Plan: Hepatitis C antibody, Hepatitis B surface antigen, Hep B Surface Antibody, Hepatic function panel  Decreased energy - improved after pain better  Atherosclerosis - carotid pvd coronary      Statin intolerance  Encounter for immunization  stop tobacco all together would be healthiest .  Nutrition attention denies food insecurity cause has food in freezer after pays her bills mid month  and out of $ -Patient advised to return or notify health care team  if symptoms worsen ,persist or new concerns arise.  Patient Instructions  Glad you are doing better  . Recheck liver tests today  .  No change  in meds  at this time.   Plan rov depending on   Lab results or 6 months       Reyah Streeter K. Danielle Lento M.D.

## 2014-10-11 NOTE — Patient Instructions (Signed)
Glad you are doing better  . Recheck liver tests today  .  No change  in meds  at this time.   Plan rov depending on   Lab results or 6 months

## 2014-10-12 LAB — HEPATITIS B SURFACE ANTIBODY,QUALITATIVE: HEP B S AB: NEGATIVE

## 2014-10-12 LAB — HEPATITIS B SURFACE ANTIGEN: Hepatitis B Surface Ag: NEGATIVE

## 2014-10-12 LAB — HEPATITIS C ANTIBODY: HCV Ab: NEGATIVE

## 2014-11-03 ENCOUNTER — Emergency Department (HOSPITAL_COMMUNITY)
Admission: EM | Admit: 2014-11-03 | Discharge: 2014-11-03 | Discharge status: Against Medical Advice | Payer: Medicare HMO | Attending: Emergency Medicine | Admitting: Emergency Medicine

## 2014-11-03 ENCOUNTER — Encounter (HOSPITAL_COMMUNITY): Payer: Self-pay | Admitting: Nurse Practitioner

## 2014-11-03 ENCOUNTER — Emergency Department (HOSPITAL_COMMUNITY): Payer: Medicare HMO

## 2014-11-03 DIAGNOSIS — Z8639 Personal history of other endocrine, nutritional and metabolic disease: Secondary | ICD-10-CM | POA: Insufficient documentation

## 2014-11-03 DIAGNOSIS — J441 Chronic obstructive pulmonary disease with (acute) exacerbation: Secondary | ICD-10-CM | POA: Diagnosis not present

## 2014-11-03 DIAGNOSIS — S9002XA Contusion of left ankle, initial encounter: Secondary | ICD-10-CM | POA: Diagnosis not present

## 2014-11-03 DIAGNOSIS — S41111A Laceration without foreign body of right upper arm, initial encounter: Secondary | ICD-10-CM | POA: Insufficient documentation

## 2014-11-03 DIAGNOSIS — I10 Essential (primary) hypertension: Secondary | ICD-10-CM | POA: Insufficient documentation

## 2014-11-03 DIAGNOSIS — S0083XA Contusion of other part of head, initial encounter: Secondary | ICD-10-CM | POA: Diagnosis not present

## 2014-11-03 DIAGNOSIS — R34 Anuria and oliguria: Secondary | ICD-10-CM | POA: Insufficient documentation

## 2014-11-03 DIAGNOSIS — Z9861 Coronary angioplasty status: Secondary | ICD-10-CM | POA: Insufficient documentation

## 2014-11-03 DIAGNOSIS — Z87442 Personal history of urinary calculi: Secondary | ICD-10-CM | POA: Insufficient documentation

## 2014-11-03 DIAGNOSIS — Y998 Other external cause status: Secondary | ICD-10-CM | POA: Insufficient documentation

## 2014-11-03 DIAGNOSIS — W108XXA Fall (on) (from) other stairs and steps, initial encounter: Secondary | ICD-10-CM | POA: Insufficient documentation

## 2014-11-03 DIAGNOSIS — Z7982 Long term (current) use of aspirin: Secondary | ICD-10-CM | POA: Diagnosis not present

## 2014-11-03 DIAGNOSIS — S29001A Unspecified injury of muscle and tendon of front wall of thorax, initial encounter: Secondary | ICD-10-CM | POA: Diagnosis not present

## 2014-11-03 DIAGNOSIS — R079 Chest pain, unspecified: Secondary | ICD-10-CM | POA: Diagnosis not present

## 2014-11-03 DIAGNOSIS — Z853 Personal history of malignant neoplasm of breast: Secondary | ICD-10-CM | POA: Diagnosis not present

## 2014-11-03 DIAGNOSIS — M1990 Unspecified osteoarthritis, unspecified site: Secondary | ICD-10-CM | POA: Diagnosis not present

## 2014-11-03 DIAGNOSIS — Z8619 Personal history of other infectious and parasitic diseases: Secondary | ICD-10-CM | POA: Diagnosis not present

## 2014-11-03 DIAGNOSIS — J449 Chronic obstructive pulmonary disease, unspecified: Secondary | ICD-10-CM | POA: Diagnosis not present

## 2014-11-03 DIAGNOSIS — Z79899 Other long term (current) drug therapy: Secondary | ICD-10-CM | POA: Diagnosis not present

## 2014-11-03 DIAGNOSIS — Z87891 Personal history of nicotine dependence: Secondary | ICD-10-CM | POA: Insufficient documentation

## 2014-11-03 DIAGNOSIS — S91012A Laceration without foreign body, left ankle, initial encounter: Secondary | ICD-10-CM | POA: Diagnosis not present

## 2014-11-03 DIAGNOSIS — Z85828 Personal history of other malignant neoplasm of skin: Secondary | ICD-10-CM | POA: Diagnosis not present

## 2014-11-03 DIAGNOSIS — R0781 Pleurodynia: Secondary | ICD-10-CM | POA: Diagnosis not present

## 2014-11-03 DIAGNOSIS — M25572 Pain in left ankle and joints of left foot: Secondary | ICD-10-CM | POA: Diagnosis not present

## 2014-11-03 DIAGNOSIS — Y9289 Other specified places as the place of occurrence of the external cause: Secondary | ICD-10-CM | POA: Insufficient documentation

## 2014-11-03 DIAGNOSIS — Y9301 Activity, walking, marching and hiking: Secondary | ICD-10-CM | POA: Insufficient documentation

## 2014-11-03 DIAGNOSIS — I200 Unstable angina: Secondary | ICD-10-CM | POA: Diagnosis not present

## 2014-11-03 DIAGNOSIS — S0003XA Contusion of scalp, initial encounter: Secondary | ICD-10-CM | POA: Diagnosis not present

## 2014-11-03 DIAGNOSIS — M79621 Pain in right upper arm: Secondary | ICD-10-CM | POA: Diagnosis not present

## 2014-11-03 DIAGNOSIS — N939 Abnormal uterine and vaginal bleeding, unspecified: Secondary | ICD-10-CM | POA: Diagnosis not present

## 2014-11-03 DIAGNOSIS — S0990XA Unspecified injury of head, initial encounter: Secondary | ICD-10-CM | POA: Diagnosis not present

## 2014-11-03 DIAGNOSIS — R110 Nausea: Secondary | ICD-10-CM | POA: Diagnosis not present

## 2014-11-03 DIAGNOSIS — W19XXXA Unspecified fall, initial encounter: Secondary | ICD-10-CM

## 2014-11-03 DIAGNOSIS — H9190 Unspecified hearing loss, unspecified ear: Secondary | ICD-10-CM | POA: Diagnosis not present

## 2014-11-03 LAB — CBC WITH DIFFERENTIAL/PLATELET
Basophils Absolute: 0 K/uL (ref 0.0–0.1)
Basophils Relative: 0 %
Eosinophils Absolute: 0.2 K/uL (ref 0.0–0.7)
Eosinophils Relative: 2 %
HCT: 40.2 % (ref 36.0–46.0)
Hemoglobin: 13 g/dL (ref 12.0–15.0)
Lymphocytes Relative: 18 %
Lymphs Abs: 1.2 K/uL (ref 0.7–4.0)
MCH: 30.1 pg (ref 26.0–34.0)
MCHC: 32.3 g/dL (ref 30.0–36.0)
MCV: 93.1 fL (ref 78.0–100.0)
Monocytes Absolute: 0.6 K/uL (ref 0.1–1.0)
Monocytes Relative: 9 %
Neutro Abs: 4.9 K/uL (ref 1.7–7.7)
Neutrophils Relative %: 71 %
Platelets: 122 K/uL — ABNORMAL LOW (ref 150–400)
RBC: 4.32 MIL/uL (ref 3.87–5.11)
RDW: 13.9 % (ref 11.5–15.5)
WBC: 7 K/uL (ref 4.0–10.5)

## 2014-11-03 LAB — BASIC METABOLIC PANEL WITH GFR
Anion gap: 8 (ref 5–15)
BUN: 22 mg/dL — ABNORMAL HIGH (ref 6–20)
CO2: 23 mmol/L (ref 22–32)
Calcium: 9.3 mg/dL (ref 8.9–10.3)
Chloride: 109 mmol/L (ref 101–111)
Creatinine, Ser: 1.04 mg/dL — ABNORMAL HIGH (ref 0.44–1.00)
GFR calc Af Amer: 56 mL/min — ABNORMAL LOW
GFR calc non Af Amer: 48 mL/min — ABNORMAL LOW
Glucose, Bld: 87 mg/dL (ref 65–99)
Potassium: 4.3 mmol/L (ref 3.5–5.1)
Sodium: 140 mmol/L (ref 135–145)

## 2014-11-03 MED ORDER — ONDANSETRON 4 MG PO TBDP
4.0000 mg | ORAL_TABLET | Freq: Once | ORAL | Status: AC
  Administered 2014-11-03: 4 mg via ORAL
  Filled 2014-11-03: qty 1

## 2014-11-03 MED ORDER — ACETAMINOPHEN 325 MG PO TABS
325.0000 mg | ORAL_TABLET | Freq: Once | ORAL | Status: AC
  Administered 2014-11-03: 325 mg via ORAL
  Filled 2014-11-03: qty 1

## 2014-11-03 NOTE — ED Notes (Signed)
Pt stable, ambulatory, states understanding of discharge risks

## 2014-11-03 NOTE — ED Provider Notes (Signed)
CSN: [id number]     Arrival date & time 11/03/14  1341 History   First MD Initiated Contact with Patient 11/03/14 1439     Chief Complaint  Patient presents with  . Fall     Patient is a 81 y.o. female presenting with fall. The history is provided by the patient.  Fall This is a new problem. The current episode started today. The problem occurs rarely. The problem has been unchanged. Associated symptoms include nausea. Pertinent negatives include no abdominal pain, chest pain, chills, diaphoresis, fatigue, fever, headaches, joint swelling, neck pain, numbness, urinary symptoms, visual change, vomiting or weakness. The symptoms are aggravated by twisting and bending. She has tried nothing for the symptoms.   [name] [name] is a 81 y.o. female presents with complaints stemming from a fall. Pt was stepping off her back deck, stumbling and fell down 1 stair onto the grass, striking her forehead and left ribs. Pt complains of a "bump" to her head above her eye brow, pain and bruising to her right medial ankle, and left side rib pain. Pt states she is "pretty sure" she didn't lose consciousness, but was not confident. Pt denies chest pain, shortness of breath, neck pain, or pelvic pain, but admits to some nausea. Denies dizziness or lightheadedness. Only anti coagulation is 325mg  ASA daily.  Pt called 911 after the fall, EMS arrived and bandaged her up but pt refused transport with EMS. Patient's friends arrived and drove her to the hospital. Pt has no history of falls.  Past Medical History  Diagnosis Date  . POSTHERPETIC NEURALGIA 08/25/2006  . CARCINOMA, BASAL CELL 02/13/2006  . NEOPLASM, SKIN, UNCERTAIN BEHAVIOR 67/[birth year]/1937  . HYPOTHYROIDISM 01/30/2007  . Unspecified vitamin D deficiency 02/07/2007  . HYPERLIPIDEMIA 03/10/2008  . UNSPECIFIED ANEMIA 03/18/2008  . THROMBOCYTOPENIA 07/30/2008  . LOSS, HEARING NOS 08/25/2006  . HYPERTENSION 02/13/2006  . UNSTABLE ANGINA 12/12/2009  . CORONARY ARTERY  DISEASE 02/13/2006  . CAROTID ARTERY DISEASE 10/03/2006  . RENAL ARTERY STENOSIS 08/01/2008  . PERIPHERAL VASCULAR DISEASE 02/13/2006  . Chronic rhinitis 12/13/2006  . CHRONIC OBSTRUCTIVE PULMONARY DISEASE, ACUTE EXACERBATION 11/17/2009  . DERMATITIS 09/25/2007  . DEGENERATIVE JOINT DISEASE 09/27/2006  . OSTEOARTHRITIS, HAND 12/16/2008  . SPINAL STENOSIS 11/01/2006  . LIVER FUNCTION TESTS, ABNORMAL 01/22/2009  . UNS ADVRS EFF UNS RX MEDICINAL&BIOLOGICAL SBSTNC 01/30/2007  . Personal history of malignant neoplasm of breast 07/24/2008  . NEPHROLITHIASIS, HX OF 02/27/2010  . Vitreous detachment   . Shingles     recurrent  . Head trauma     closed  . Personal history of urinary calculi   . Post-traumatic wound infection ([place]) 12/01/2010    Mild streaking superiorly and on one day of septra  Will give rocephin and add keflex  adn follow up   . Near syncope 09/18/2014   Past Surgical History  Procedure Laterality Date  . Lumbar laminectomy    . Cataract extraction    . Abdominal hysterectomy    . Breast lumpectomy    . Tonsillectomy    . Coronary angioplasty with stent placement      bilat iliac stents, left renal artery and rt coronary artery last myoview 8/09 with EF 70%  . Skin cancer excision    . Breast lumpectomy  1998   Family History  Problem Relation Age of Onset  . Breast cancer Daughter   . Tuberculosis Father    Social History  Substance Use Topics  . Smoking status: Former Smoker -- 1.00  packs/day for 30 years    Types: Cigarettes    Quit date: 06/15/2010  . Smokeless tobacco: Never Used     Comment: 2-3 cigs per week/// 06/30/12  . Alcohol Use: No     Comment: socially   OB History    No data available     Review of Systems  Constitutional: Negative for fever, chills, diaphoresis and fatigue.  Eyes: Negative for visual disturbance.  Cardiovascular: Negative for chest pain and palpitations.  Gastrointestinal: Positive for nausea. Negative for vomiting and  abdominal pain.  Genitourinary: Positive for decreased urine volume and vaginal bleeding. Negative for dysuria, flank pain and pelvic pain.  Musculoskeletal: Negative for joint swelling and neck pain.  Neurological: Negative for dizziness, tremors, syncope, speech difficulty, weakness, numbness and headaches.  All other systems reviewed and are negative.     Allergies  Statins; Oxycodone; and Tequin  Home Medications   Prior to Admission medications   Medication Sig Start Date End Date Taking? Authorizing Provider  amLODipine (NORVASC) 10 MG tablet Take 1 tablet (10 mg total) by mouth daily. 09/30/14   Lelon Perla, MD  aspirin (BAYER ASPIRIN) 325 MG tablet Take 1 tablet (325 mg total) by mouth daily. 09/30/14   Kathrynn Ducking, MD  carboxymethylcellulose (REFRESH PLUS) 0.5 % SOLN Place 1 drop into both eyes 4 (four) times daily.    Historical Provider, MD  Cholecalciferol (VITAMIN D) 1000 UNITS capsule Take 1,000 Units by mouth daily.      Historical Provider, MD  fish oil-omega-3 fatty acids 1000 MG capsule Take 1 g by mouth daily.     Historical Provider, MD  isosorbide mononitrate (IMDUR) 30 MG 24 hr tablet Take 1 tablet (30 mg total) by mouth daily. 08/16/13   Lelon Perla, MD  levalbuterol (XOPENEX HFA) 45 MCG/ACT inhaler INHALE TWO PUFFS EVERY 6 HOURS AS NEEDED FOR WHEEZING 10/03/13   Tammy S Parrett, NP  metoprolol succinate (TOPROL-XL) 25 MG 24 hr tablet Take 1 tablet (25 mg total) by mouth daily. 09/20/14   Lelon Perla, MD  nitroGLYCERIN (NITROSTAT) 0.4 MG SL tablet Place 1 tablet (0.4 mg total) under the tongue every 5 (five) minutes as needed. 08/07/12   Lelon Perla, MD  tiotropium (SPIRIVA) 18 MCG inhalation capsule Place 1 capsule (18 mcg total) into inhaler and inhale daily. 07/24/14   Tammy S Parrett, NP  traMADol (ULTRAM) 50 MG tablet TAKE ONE TABLET BY MOUTH EVERY EIGHT HOURS AS NEEDED FOR PAIN 06/28/12   Burnis Medin, MD   BP 149/48 mmHg  Pulse 64   Temp(Src) 97.9 F (36.6 C) (Oral)  Resp 16  Ht 5\' 4"  (1.626 m)  Wt 92 lb (41.731 kg)  BMI 15.78 kg/m2  SpO2 100% Physical Exam  Constitutional: She is oriented to person, place, and time. She appears well-developed and well-nourished. No distress.  HENT:  Head: Normocephalic.    Eyes: Conjunctivae and EOM are normal. Pupils are equal, round, and reactive to light.  Cardiovascular: Normal rate, regular rhythm, normal heart sounds and intact distal pulses.   Pulmonary/Chest: Effort normal and breath sounds normal. No respiratory distress. She exhibits tenderness. She exhibits no crepitus, no deformity and no swelling.    Abdominal: Soft. Bowel sounds are normal.  Musculoskeletal: She exhibits no edema.       Left ankle: She exhibits swelling and ecchymosis. She exhibits no deformity and normal pulse. Tenderness.  Neurological: She is alert and oriented to person, place, and time.  Skin: Skin is warm and dry. Abrasion noted. She is not diaphoretic.     Nursing note and vitals reviewed.   ED Course  Procedures (including critical care time) Labs Review Labs Reviewed - No data to display  Imaging Review Ct Head Wo Contrast  11/03/2014   CLINICAL DATA:  Fall. Loss of consciousness. Head trauma with ecchymosis in the left supraorbital region.  EXAM: CT HEAD WITHOUT CONTRAST  CT CERVICAL SPINE WITHOUT CONTRAST  TECHNIQUE: Multidetector CT imaging of the head and cervical spine was performed following the standard protocol without intravenous contrast. Multiplanar CT image reconstructions of the cervical spine were also generated.  COMPARISON:  09/10/2010 sinus CT. 09/27/2014 brain MRI. 01/17/2008 head CT.  FINDINGS: CT HEAD FINDINGS  There is a small focal contusion in the left frontal scalp.  No evidence of parenchymal hemorrhage or extra-axial fluid collection. No mass lesion, mass effect, or midline shift.  No CT evidence of acute infarction. Atherosclerotic intracranial arterial  calcifications are present. There is stable mild generalized cerebral volume loss. There is stable small focus of encephalomalacia in the medial left occipital lobe. There are stable chronic bilateral globus pallidus calcifications. Nonspecific moderate subcortical and periventricular white matter hypodensity, most in keeping with chronic small vessel ischemic change. Stable normal size cerebral ventricles.  The visualized paranasal sinuses are essentially clear. The mastoid air cells are unopacified. No evidence of calvarial fracture. Stable remote mild medial right orbital wall blowout fracture.  CT CERVICAL SPINE FINDINGS  No fracture is detected in the cervical spine. No prevertebral soft tissue swelling. There is slight reversal of the normal cervical lordosis, usually due to positioning and/ or muscle spasm. Dens is well positioned between the lateral masses of C1. No subluxation at the facets. There is severe multilevel degenerative disc disease in the mid to lower cervical spine, most prominent at C5-6, C6-7 and C7-T1, with moderate disc osteophyte complexes posteriorly at C5-6 and C6-7 causing anterior canal stenosis. There is symmetric severe facet arthropathy throughout the cervical spine. There is multilevel degenerative foraminal stenosis bilaterally in the cervical spine, most severe on the right at C5-6 and on the left at C6-7. There is 3 mm anterolisthesis at C3-4, 2 mm anterolisthesis at C4-5, 2 mm retrolisthesis at C6-7 and 2 mm anterolisthesis at C7-T1, likely degenerative in etiology.  Visualized mastoid air cells appear clear. No evidence of intra-axial hemorrhage in the visualized brain. No gross cervical canal hematoma. There is irregular thickening of the cartilaginous portion of the tracheal wall, sparing the membranous portion of the tracheal wall. Irregular relatively symmetric subpleural consolidation and reticulation at both lung apices, favor pleural parenchymal scarring. Mild  centrilobular emphysema. There is a 1.1 cm circumscribed ovoid hyperdense nodule in the left supraglottic larynx (series 302/ image 51). Atherosclerotic calcifications are present throughout the extracranial carotid arteries bilaterally. There is a 0.6 cm hypodense left thyroid lobe nodule. No appreciable cervical adenopathy.  IMPRESSION: 1. Small left frontal scalp contusion. No acute intracranial abnormality. No calvarial fracture. 2. No cervical spine fracture. 3. Intracranial atherosclerosis, generalized cerebral volume loss and chronic small vessel ischemic change in the white matter. Stable small focus of encephalomalacia in the medial left occipital lobe. 4. Reversal of the normal cervical lordosis, usually positional and/or due to muscle spasm. 5. Severe degenerative disc disease and bilateral facet arthropathy in the cervical spine. Mild multilevel degenerative spondylolisthesis in the cervical spine. Multilevel degenerative cervical foraminal stenosis as described. 6. Irregular tracheal wall thickening sparing the membranous posterior tracheal wall,  consider relapsing polychondritis, amyloidosis or other etiologies. 7. Circumscribed ovoid 1.1 cm hyperdense left supraglottic larynx nodule, nonspecific, recommend ENT consultation and correlation with direct visualization. 8. Mild centrilobular emphysema at the lung apices.   Electronically Signed   By: Ilona Sorrel M.D.   On: 11/03/2014 16:00   Ct Cervical Spine Wo Contrast  11/03/2014   CLINICAL DATA:  Fall. Loss of consciousness. Head trauma with ecchymosis in the left supraorbital region.  EXAM: CT HEAD WITHOUT CONTRAST  CT CERVICAL SPINE WITHOUT CONTRAST  TECHNIQUE: Multidetector CT imaging of the head and cervical spine was performed following the standard protocol without intravenous contrast. Multiplanar CT image reconstructions of the cervical spine were also generated.  COMPARISON:  09/10/2010 sinus CT. 09/27/2014 brain MRI. 01/17/2008 head CT.   FINDINGS: CT HEAD FINDINGS  There is a small focal contusion in the left frontal scalp.  No evidence of parenchymal hemorrhage or extra-axial fluid collection. No mass lesion, mass effect, or midline shift.  No CT evidence of acute infarction. Atherosclerotic intracranial arterial calcifications are present. There is stable mild generalized cerebral volume loss. There is stable small focus of encephalomalacia in the medial left occipital lobe. There are stable chronic bilateral globus pallidus calcifications. Nonspecific moderate subcortical and periventricular white matter hypodensity, most in keeping with chronic small vessel ischemic change. Stable normal size cerebral ventricles.  The visualized paranasal sinuses are essentially clear. The mastoid air cells are unopacified. No evidence of calvarial fracture. Stable remote mild medial right orbital wall blowout fracture.  CT CERVICAL SPINE FINDINGS  No fracture is detected in the cervical spine. No prevertebral soft tissue swelling. There is slight reversal of the normal cervical lordosis, usually due to positioning and/ or muscle spasm. Dens is well positioned between the lateral masses of C1. No subluxation at the facets. There is severe multilevel degenerative disc disease in the mid to lower cervical spine, most prominent at C5-6, C6-7 and C7-T1, with moderate disc osteophyte complexes posteriorly at C5-6 and C6-7 causing anterior canal stenosis. There is symmetric severe facet arthropathy throughout the cervical spine. There is multilevel degenerative foraminal stenosis bilaterally in the cervical spine, most severe on the right at C5-6 and on the left at C6-7. There is 3 mm anterolisthesis at C3-4, 2 mm anterolisthesis at C4-5, 2 mm retrolisthesis at C6-7 and 2 mm anterolisthesis at C7-T1, likely degenerative in etiology.  Visualized mastoid air cells appear clear. No evidence of intra-axial hemorrhage in the visualized brain. No gross cervical canal  hematoma. There is irregular thickening of the cartilaginous portion of the tracheal wall, sparing the membranous portion of the tracheal wall. Irregular relatively symmetric subpleural consolidation and reticulation at both lung apices, favor pleural parenchymal scarring. Mild centrilobular emphysema. There is a 1.1 cm circumscribed ovoid hyperdense nodule in the left supraglottic larynx (series 302/ image 51). Atherosclerotic calcifications are present throughout the extracranial carotid arteries bilaterally. There is a 0.6 cm hypodense left thyroid lobe nodule. No appreciable cervical adenopathy.  IMPRESSION: 1. Small left frontal scalp contusion. No acute intracranial abnormality. No calvarial fracture. 2. No cervical spine fracture. 3. Intracranial atherosclerosis, generalized cerebral volume loss and chronic small vessel ischemic change in the white matter. Stable small focus of encephalomalacia in the medial left occipital lobe. 4. Reversal of the normal cervical lordosis, usually positional and/or due to muscle spasm. 5. Severe degenerative disc disease and bilateral facet arthropathy in the cervical spine. Mild multilevel degenerative spondylolisthesis in the cervical spine. Multilevel degenerative cervical foraminal stenosis as described. 6.  Irregular tracheal wall thickening sparing the membranous posterior tracheal wall, consider relapsing polychondritis, amyloidosis or other etiologies. 7. Circumscribed ovoid 1.1 cm hyperdense left supraglottic larynx nodule, nonspecific, recommend ENT consultation and correlation with direct visualization. 8. Mild centrilobular emphysema at the lung apices.   Electronically Signed   By: Ilona Sorrel M.D.   On: 11/03/2014 16:00   I have personally reviewed and evaluated these images and lab results as part of my medical decision-making.   EKG Interpretation None      MDM   Final diagnoses:  Fall, initial encounter    KENLEI SAFI presents with  rib pain, head pain, and ankle pain following a fall onto grass.  Findings and plan of care discussed with Dr. Maryan Rued.  Will obtain Head CT, C-spine CT, Rib xray of left side, xray of right humerus, and complete xray of left ankle. Will start pain management with tylenol and administer Zofran for nausea.  When explaining pain medication options to patient, she initially told me she did not want any pain medication, but then changed her mind and accepted some tylenol.  3:38 PM End of shift report given to [employer], PA-C. Plan is to discharge home with pain management if all imaging is clear. Pt will be instructed to followup with PCP as needed and return to ED should symptoms worsen.  4:10 PM Head CT shows no evidence of intracranial hemorrhage or skull fractures. C-spine CT shows no bleeding or fractures.  [name] Bender, PA-C 11/03/14 [place], MD 11/03/14 2126

## 2014-11-03 NOTE — Discharge Instructions (Signed)
Fall Prevention and Home Safety Falls cause injuries and can affect all age groups. It is possible to prevent falls.  HOW TO PREVENT FALLS  Wear shoes with rubber soles that do not have an opening for your toes.  Keep the inside and outside of your house well lit.  Use night lights throughout your home.  Remove clutter from floors.  Clean up floor spills.  Remove throw rugs or fasten them to the floor with carpet tape.  Do not place electrical cords across pathways.  Put grab bars by your tub, shower, and toilet. Do not use towel bars as grab bars.  Put handrails on both sides of the stairway. Fix loose handrails.  Do not climb on stools or stepladders, if possible.  Do not wax your floors.  Repair uneven or unsafe sidewalks, walkways, or stairs.  Keep items you use a lot within reach.  Be aware of pets.  Keep emergency numbers next to the telephone.  Put smoke detectors in your home and near bedrooms. Ask your doctor what other things you can do to prevent falls. Document Released: 11/14/2008 Document Revised: 07/20/2011 Document Reviewed: 04/20/2011 [facility] Patient Information 2015 [place], [place]. This information is not intended to replace advice given to you by your health care provider. Make sure you discuss any questions you have with your health care provider.  Fall Prevention and Home Safety Falls cause injuries and can affect all age groups. It is possible to prevent falls.  HOW TO PREVENT FALLS  Wear shoes with rubber soles that do not have an opening for your toes.  Keep the inside and outside of your house well lit.  Use night lights throughout your home.  Remove clutter from floors.  Clean up floor spills.  Remove throw rugs or fasten them to the floor with carpet tape.  Do not place electrical cords across pathways.  Put grab bars by your tub, shower, and toilet. Do not use towel bars as grab bars.  Put handrails on both sides of the  stairway. Fix loose handrails.  Do not climb on stools or stepladders, if possible.  Do not wax your floors.  Repair uneven or unsafe sidewalks, walkways, or stairs.  Keep items you use a lot within reach.  Be aware of pets.  Keep emergency numbers next to the telephone.  Put smoke detectors in your home and near bedrooms. Ask your doctor what other things you can do to prevent falls. Document Released: 11/14/2008 Document Revised: 07/20/2011 Document Reviewed: 04/20/2011 [facility] Patient Information 2015 [place], [place]. This information is not intended to replace advice given to you by your health care provider. Make sure you discuss any questions you have with your health care provider.   Return to the emergency department if you experience chest pain, loss of consciousness, dizziness, weakness, headache, blurry vision.

## 2014-11-03 NOTE — ED Provider Notes (Signed)
Pt signed out to me at shift change. Pt originally stated to previous provider that she experienced a mechanical fall while walking down the stairs. Patient typically holds onto the railing while walking stairs however this time they were wet and she had sustained them and she fell down one step onto the ground. Denies loss of consciousness. However, several hours later patient states she may have felt dizzy before she fell down. Denies dizziness at this time. Patient states she feels this way often and this is not a new symptom for her. Discussed that this new symptom requires a further workup. However patient does not want one at this time. Was able to have patient  and family agreed to performing an EKG, basic blood work and orthostatic vital signs. Patient refuses a further workup at this time. Discussed with patient potential consequences of returning home and with the symptoms might mean for her, specifically that she is at risk for death and severe disability. Pt is at capacity to make these decisions.  Patient expresses understandings of potential repercussions and would still like to go home at this time [place]. Discussed this case with Dr. Oleta Mouse.   All imaging negative for acute abnormality. CBC within normal limits.  Dondra Spry [place], PA-C 11/03/14 1733  Forde Dandy, MD 11/04/14 [phone]

## 2014-11-03 NOTE — ED Notes (Signed)
She missed grabbing handrail on porch and fell approximately 1 step onto grass. She states she may have lost consciousness. She hit her head. She has bruising to L side of face, lacerations and abrasions to R arm and legs with bleeding controlled. She c/o head pain, L ribcage/breast pain with palpation and inspiration, R arm and bilateral leg pain. She is A&Ox4, MAE, breathing easily

## 2014-11-04 ENCOUNTER — Other Ambulatory Visit: Payer: Medicare HMO

## 2014-11-05 ENCOUNTER — Encounter: Payer: Self-pay | Admitting: Internal Medicine

## 2014-11-05 ENCOUNTER — Telehealth: Payer: Self-pay | Admitting: Internal Medicine

## 2014-11-05 ENCOUNTER — Ambulatory Visit (INDEPENDENT_AMBULATORY_CARE_PROVIDER_SITE_OTHER): Payer: Medicare HMO | Admitting: Internal Medicine

## 2014-11-05 VITALS — BP 124/70 | Temp 98.4°F | Ht 64.0 in | Wt 98.4 lb

## 2014-11-05 DIAGNOSIS — Z23 Encounter for immunization: Secondary | ICD-10-CM

## 2014-11-05 DIAGNOSIS — IMO0002 Reserved for concepts with insufficient information to code with codable children: Secondary | ICD-10-CM

## 2014-11-05 DIAGNOSIS — T148XXA Other injury of unspecified body region, initial encounter: Secondary | ICD-10-CM

## 2014-11-05 DIAGNOSIS — R296 Repeated falls: Secondary | ICD-10-CM

## 2014-11-05 DIAGNOSIS — T148 Other injury of unspecified body region: Secondary | ICD-10-CM

## 2014-11-05 DIAGNOSIS — T07XXXA Unspecified multiple injuries, initial encounter: Secondary | ICD-10-CM

## 2014-11-05 DIAGNOSIS — Z9181 History of falling: Secondary | ICD-10-CM

## 2014-11-05 NOTE — Telephone Encounter (Signed)
Patient Name: [name] [name]  DOB: 09/13/1927    Initial Comment Caller states fell and went to ED Sunday; has wound on RT arm near muscle from shoulder to elbow; was adv to f/u w/MD; some of skin is still hanging; leg, ankle, ribs, eye;    Nurse Assessment  Nurse: Mallie Mussel, RN, Alveta Heimlich Date/Time (Eastern Time): 11/05/2014 11:01:06 AM  Confirm and document reason for call. If symptomatic, describe symptoms. ---Caller states that she fell and was seen in ER on Sunday. She was advised to follow up with her PCP today. There is an open wound in between her shoulder and her elbow, in the bicep area. It bled some last night, but she has not seen any yellow or green discharge. Denies fever. She also has pain in her left side from where she fell and she rates it as severe pain with movements. She has not taken anything for the pain,.  Has the patient traveled out of the country within the last 30 days? ---No  Does the patient have any new or worsening symptoms? ---Yes  Will a triage be completed? ---Yes  Related visit to physician within the last 2 weeks? ---No  Does the PT have any chronic conditions? (i.e. diabetes, asthma, etc.) ---Yes  List chronic conditions. ---Heart problems     Guidelines    Guideline Title Affirmed Question Affirmed Notes  Arm Pain Entire arm is swollen    Final Disposition User   See Physician within 4 Hours (or PCP triage) Mallie Mussel, RN, [place]    Referrals  REFERRED TO PCP OFFICE   Disagree/Comply: Leta Baptist

## 2014-11-05 NOTE — Patient Instructions (Signed)
Avoid ladders a potential falls.  Try to take deep breaths every half hour splinting her left rib cage to avoid  pneumonia lung infection. Can use Tylenol and add on low-dose tramadol if needed for severe pain. Keep the right arm wound dry and clean can leave the Tegaderm on for 3 days and then change it nonstick dressings all around. Contact office looking infected.  It appears all your x-rays were negative for fracture. Contact care team if increasing headache weakness or balance problem.     Fall Prevention and Home Safety Falls cause injuries and can affect all age groups. It is possible to use preventive measures to significantly decrease the likelihood of falls. There are many simple measures which can make your home safer and prevent falls. OUTDOORS  Repair cracks and edges of walkways and driveways.  Remove high doorway thresholds.  Trim shrubbery on the main path into your home.  Have good outside lighting.  Clear walkways of tools, rocks, debris, and clutter.  Check that handrails are not broken and are securely fastened. Both sides of steps should have handrails.  Have leaves, snow, and ice cleared regularly.  Use sand or salt on walkways during winter months.  In the garage, clean up grease or oil spills. BATHROOM  Install night lights.  Install grab bars by the toilet and in the tub and shower.  Use non-skid mats or decals in the tub or shower.  Place a plastic non-slip stool in the shower to sit on, if needed.  Keep floors dry and clean up all water on the floor immediately.  Remove soap buildup in the tub or shower on a regular basis.  Secure bath mats with non-slip, double-sided rug tape.  Remove throw rugs and tripping hazards from the floors. BEDROOMS  Install night lights.  Make sure a bedside light is easy to reach.  Do not use oversized bedding.  Keep a telephone by your bedside.  Have a firm chair with side arms to use for getting  dressed.  Remove throw rugs and tripping hazards from the floor. KITCHEN  Keep handles on pots and pans turned toward the center of the stove. Use back burners when possible.  Clean up spills quickly and allow time for drying.  Avoid walking on wet floors.  Avoid hot utensils and knives.  Position shelves so they are not too high or low.  Place commonly used objects within easy reach.  If necessary, use a sturdy step stool with a grab bar when reaching.  Keep electrical cables out of the way.  Do not use floor polish or wax that makes floors slippery. If you must use wax, use non-skid floor wax.  Remove throw rugs and tripping hazards from the floor. STAIRWAYS  Never leave objects on stairs.  Place handrails on both sides of stairways and use them. Fix any loose handrails. Make sure handrails on both sides of the stairways are as long as the stairs.  Check carpeting to make sure it is firmly attached along stairs. Make repairs to worn or loose carpet promptly.  Avoid placing throw rugs at the top or bottom of stairways, or properly secure the rug with carpet tape to prevent slippage. Get rid of throw rugs, if possible.  Have an electrician put in a light switch at the top and bottom of the stairs. OTHER FALL PREVENTION TIPS  Wear low-heel or rubber-soled shoes that are supportive and fit well. Wear closed toe shoes.  When using a stepladder,  make sure it is fully opened and both spreaders are firmly locked. Do not climb a closed stepladder.  Add color or contrast paint or tape to grab bars and handrails in your home. Place contrasting color strips on first and last steps.  Learn and use mobility aids as needed. Install an electrical emergency response system.  Turn on lights to avoid dark areas. Replace light bulbs that burn out immediately. Get light switches that glow.  Arrange furniture to create clear pathways. Keep furniture in the same place.  Firmly attach  carpet with non-skid or double-sided tape.  Eliminate uneven floor surfaces.  Select a carpet pattern that does not visually hide the edge of steps.  Be aware of all pets. OTHER HOME SAFETY TIPS  Set the water temperature for 120 F (48.8 C).  Keep emergency numbers on or near the telephone.  Keep smoke detectors on every level of the home and near sleeping areas. Document Released: 01/08/2002 Document Revised: 07/20/2011 Document Reviewed: 04/09/2011 [facility] Patient Information 2015 [place], [place]. This information is not intended to replace advice given to you by your health care provider. Make sure you discuss any questions you have with your health care provider.

## 2014-11-05 NOTE — Progress Notes (Signed)
Pre visit review using our clinic review tool, if applicable. No additional management support is needed unless otherwise documented below in the visit note.  Chief Complaint  Patient presents with  . ED Follow Up    HPI: Patient [name] [name]  comes in today for SDA for problem evaluation. She was seen in the emergency room October 2 after what was felt to be a mechanical fall but then describes some type of dizziness before the fall. She left AMA because she didn't want more of a workup. She comes in today for an acute visit at her request. Her story is that she was staining her deck and railing and started walking down the stairs but didn't want to touch the railing because it was just stained. She remembers falling and then doesn't remember after that but she woke up hurting and crawled to her phone and called 911. They checked her out but she didn't want the ambulance charge so got a friend or acquaintance to drive her to the hospital. She was seen right away and had multiple imaging studies including her head neck ribs ankle arm and nothing was broken. She had some abrasions and contusions including left forehead. Because the question of dizziness she was advised to stay for further workup which she declined. Since that time she's had no active bleeding but has some pain in the left anterior chest soreness of the ribs. She also has a tender right upper arm laceration is bit painful for her without any redness or infection. Wants [place] to look at it. Denies any change in her vision or hearing. Doesn't feel that well but that is not new.  ROS: See pertinent positives and negatives per HPI.  Past Medical History  Diagnosis Date  . POSTHERPETIC NEURALGIA 08/25/2006  . CARCINOMA, BASAL CELL 02/13/2006  . NEOPLASM, SKIN, UNCERTAIN BEHAVIOR 62/13/0865  . HYPOTHYROIDISM 01/30/2007  . Unspecified vitamin D deficiency 02/07/2007  . HYPERLIPIDEMIA 03/10/2008  . UNSPECIFIED ANEMIA 03/18/2008  .  THROMBOCYTOPENIA 07/30/2008  . LOSS, HEARING NOS 08/25/2006  . HYPERTENSION 02/13/2006  . UNSTABLE ANGINA 12/12/2009  . CORONARY ARTERY DISEASE 02/13/2006  . CAROTID ARTERY DISEASE 10/03/2006  . RENAL ARTERY STENOSIS 08/01/2008  . PERIPHERAL VASCULAR DISEASE 02/13/2006  . Chronic rhinitis 12/13/2006  . CHRONIC OBSTRUCTIVE PULMONARY DISEASE, ACUTE EXACERBATION 11/17/2009  . DERMATITIS 09/25/2007  . DEGENERATIVE JOINT DISEASE 09/27/2006  . OSTEOARTHRITIS, HAND 12/16/2008  . SPINAL STENOSIS 11/01/2006  . LIVER FUNCTION TESTS, ABNORMAL 01/22/2009  . UNS ADVRS EFF UNS RX MEDICINAL&BIOLOGICAL SBSTNC 01/30/2007  . Personal history of malignant neoplasm of breast 07/24/2008  . NEPHROLITHIASIS, HX OF 02/27/2010  . Vitreous detachment   . Shingles     recurrent  . Head trauma     closed  . Personal history of urinary calculi   . 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 Near syncope 09/18/2014    Family History  Problem Relation Age of Onset  . Breast cancer Daughter   . Tuberculosis Father     Social History   Social History  . Marital Status: Widowed    Spouse Name: N/A  . Number of Children: N/A  . Years of Education: 13   Occupational History  . retired    Social History Main Topics  . Smoking status: Former Smoker -- 1.00 packs/day for 30 years  Types: Cigarettes    Quit date: 06/15/2010  . Smokeless tobacco: Never Used     Comment: 2-3 cigs per week/// 06/30/12  . Alcohol Use: No     Comment: socially  . Drug Use: No  . Sexual Activity: Not Asked   Other Topics Concern  . None   Social History Narrative   Widowed   5 hours of sleep   Has friends checking on her   No Pets      Patient drinks about 6 cups of caffeine daily.   Patient is right handed.    Outpatient Prescriptions Prior to Visit  Medication Sig Dispense Refill  . amLODipine (NORVASC) 10 MG  tablet Take 1 tablet (10 mg total) by mouth daily. 90 tablet 2  . aspirin (BAYER ASPIRIN) 325 MG tablet Take 1 tablet (325 mg total) by mouth daily.    . carboxymethylcellulose (REFRESH PLUS) 0.5 % SOLN Place 1 drop into both eyes 4 (four) times daily.    . Cholecalciferol (VITAMIN D) 1000 UNITS capsule Take 1,000 Units by mouth daily.      . fish oil-omega-3 fatty acids 1000 MG capsule Take 1 g by mouth daily.     . isosorbide mononitrate (IMDUR) 30 MG 24 hr tablet Take 1 tablet (30 mg total) by mouth daily. 90 tablet 3  . levalbuterol (XOPENEX HFA) 45 MCG/ACT inhaler INHALE TWO PUFFS EVERY 6 HOURS AS NEEDED FOR WHEEZING 15 g 6  . metoprolol succinate (TOPROL-XL) 25 MG 24 hr tablet Take 1 tablet (25 mg total) by mouth daily. 30 tablet 12  . tiotropium (SPIRIVA) 18 MCG inhalation capsule Place 1 capsule (18 mcg total) into inhaler and inhale daily. 30 capsule 6  . traMADol (ULTRAM) 50 MG tablet TAKE ONE TABLET BY MOUTH EVERY EIGHT HOURS AS NEEDED FOR PAIN (Patient not taking: Reported on 11/05/2014) 40 tablet 0   No facility-administered medications prior to visit.     EXAM:  BP 124/70 mmHg  Temp(Src) 98.4 F (36.9 C) (Oral)  Ht 5\' 4"  (1.626 m)  Wt 98 lb 6.4 oz (44.634 kg)  BMI 16.88 kg/m2  Body mass index is 16.88 kg/(m^2).  GENERAL: vitals reviewed and listed above, alert, oriented, appears well hydrated and in no acute distress HEENT: Hematoma ecchymosis over the left forehead below theand left orbit traumatic, conjunctiva  clear, no obvious abnormalities on inspection of external nose and ears NECK: no obvious masses on inspection palpation  LUNGS: clear to auscultation bilaterally, no wheezes, rales some transmitted rhonchi., good air movement tender left lateral anterior rib CV: HRRR, no clubbing cyanosis or  peripheral edema nl cap refill  MS: moves all extremities without noticeable focal  Abnormality Some bruising down her left leg ankle right upper extremity medial proximal  there is a 3-4 cm avulsion of her skin. Skin thickness with clean base. No streaking or pus. She has a Telfa dressing on it. Skin area debrided with sterile scissors and pickups. Nonstick Telfa Tegaderm placed patient well-tolerated no bleeding. PSYCH: pleasant and cooperative, no obvious depression or anxiety Cognition appears normal for her. Lab Results  Component Value Date   WBC 7.0 11/03/2014   HGB 13.0 11/03/2014   HCT 40.2 11/03/2014   PLT 122* 11/03/2014   GLUCOSE 87 11/03/2014   CHOL 160 08/02/2014   TRIG 57.0 08/02/2014   HDL 64.70 08/02/2014   LDLCALC 84 08/02/2014   ALT 27 10/11/2014   AST 27 10/11/2014   NA 140 11/03/2014   K 4.3 11/03/2014  CL 109 11/03/2014   CREATININE 1.04* 11/03/2014   BUN 22* 11/03/2014   CO2 23 11/03/2014   TSH 2.04 08/02/2014   INR 1.04 09/20/2010   HGBA1C 6.0 08/15/2012    ASSESSMENT AND PLAN:  Discussed the following assessment and plan:  Hx of falling, presenting hazards to health  Skin avulsion  Multiple contusions  Skin laceration  Need for vaccination with 13-polyvalent pneumococcal conjugate vaccine - Plan: Pneumococcal conjugate vaccine 13-valent Discussed at length fall prevention she has a history of syncope evaluated but this sounds mechanical and hit her head perhaps concussive. But she is thinking at her baseline at this time counseled about high risk situation symptoms local care of her wound and she can come back 1-2 weeks let us check it. Told her she should not put up surveillance is over her windows. Someone else can do it.. Too risky to fall this time. -Patient advised to return or notify health care team  if symptoms worsen ,persist or new concerns arise. Total visit 43mins > 50% spent counseling and coordinating care as indicated in above note and in instructions to patient .    Patient Instructions  Avoid ladders a potential falls.  Try to take deep breaths every half hour splinting her left rib cage to  avoid  pneumonia lung infection. Can use Tylenol and add on low-dose tramadol if needed for severe pain. Keep the right arm wound dry and clean can leave the Tegaderm on for 3 days and then change it nonstick dressings all around. Contact office looking infected.  It appears all your x-rays were negative for fracture. Contact care team if increasing headache weakness or balance problem.     Fall Prevention and Home Safety Falls cause injuries and can affect all age groups. It is possible to use preventive measures to significantly decrease the likelihood of falls. There are many simple measures which can make your home safer and prevent falls. OUTDOORS  Repair cracks and edges of walkways and driveways.  Remove high doorway thresholds.  Trim shrubbery on the main path into your home.  Have good outside lighting.  Clear walkways of tools, rocks, debris, and clutter.  Check that handrails are not broken and are securely fastened. Both sides of steps should have handrails.  Have leaves, snow, and ice cleared regularly.  Use sand or salt on walkways during winter months.  In the garage, clean up grease or oil spills. BATHROOM  Install night lights.  Install grab bars by the toilet and in the tub and shower.  Use non-skid mats or decals in the tub or shower.  Place a plastic non-slip stool in the shower to sit on, if needed.  Keep floors dry and clean up all water on the floor immediately.  Remove soap buildup in the tub or shower on a regular basis.  Secure bath mats with non-slip, double-sided rug tape.  Remove throw rugs and tripping hazards from the floors. BEDROOMS  Install night lights.  Make sure a bedside light is easy to reach.  Do not use oversized bedding.  Keep a telephone by your bedside.  Have a firm chair with side arms to use for getting dressed.  Remove throw rugs and tripping hazards from the floor. KITCHEN  Keep handles on pots and pans turned  toward the center of the stove. Use back burners when possible.  Clean up spills quickly and allow time for drying.  Avoid walking on wet floors.  Avoid hot utensils and knives.  Position shelves so they are not too high or low.  Place commonly used objects within easy reach.  If necessary, use a sturdy step stool with a grab bar when reaching.  Keep electrical cables out of the way.  Do not use floor polish or wax that makes floors slippery. If you must use wax, use non-skid floor wax.  Remove throw rugs and tripping hazards from the floor. STAIRWAYS  Never leave objects on stairs.  Place handrails on both sides of stairways and use them. Fix any loose handrails. Make sure handrails on both sides of the stairways are as long as the stairs.  Check carpeting to make sure it is firmly attached along stairs. Make repairs to worn or loose carpet promptly.  Avoid placing throw rugs at the top or bottom of stairways, or properly secure the rug with carpet tape to prevent slippage. Get rid of throw rugs, if possible.  Have an electrician put in a light switch at the top and bottom of the stairs. OTHER FALL PREVENTION TIPS  Wear low-heel or rubber-soled shoes that are supportive and fit well. Wear closed toe shoes.  When using a stepladder, make sure it is fully opened and both spreaders are firmly locked. Do not climb a closed stepladder.  Add color or contrast paint or tape to grab bars and handrails in your home. Place contrasting color strips on first and last steps.  Learn and use mobility aids as needed. Install an electrical emergency response system.  Turn on lights to avoid dark areas. Replace light bulbs that burn out immediately. Get light switches that glow.  Arrange furniture to create clear pathways. Keep furniture in the same place.  Firmly attach carpet with non-skid or double-sided tape.  Eliminate uneven floor surfaces.  Select a carpet pattern that does not  visually hide the edge of steps.  Be aware of all pets. OTHER HOME SAFETY TIPS  Set the water temperature for 120 F (48.8 C).  Keep emergency numbers on or near the telephone.  Keep smoke detectors on every level of the home and near sleeping areas. Document Released: 01/08/2002 Document Revised: 07/20/2011 Document Reviewed: 04/09/2011 [facility] Patient Information 2015 [place], [place]. This information is not intended to replace advice given to you by your health care provider. Make sure you discuss any questions you have with your health care provider.       Standley Brooking. Abrahim Sargent M.D.

## 2014-11-05 NOTE — Telephone Encounter (Signed)
Appointment scheduled with Dr. Regis Bill today

## 2014-11-06 ENCOUNTER — Other Ambulatory Visit: Payer: Self-pay | Admitting: Cardiology

## 2014-11-06 MED ORDER — ISOSORBIDE MONONITRATE ER 30 MG PO TB24: 30.0000 mg | ORAL_TABLET | Freq: Every day | ORAL | Status: DC

## 2014-12-16 DIAGNOSIS — D044 Carcinoma in situ of skin of scalp and neck: Secondary | ICD-10-CM | POA: Diagnosis not present

## 2014-12-16 DIAGNOSIS — L820 Inflamed seborrheic keratosis: Secondary | ICD-10-CM | POA: Diagnosis not present

## 2014-12-16 DIAGNOSIS — L570 Actinic keratosis: Secondary | ICD-10-CM | POA: Diagnosis not present

## 2014-12-16 DIAGNOSIS — C44320 Squamous cell carcinoma of skin of unspecified parts of face: Secondary | ICD-10-CM | POA: Diagnosis not present

## 2014-12-20 ENCOUNTER — Encounter: Payer: Self-pay | Admitting: Adult Health

## 2014-12-20 ENCOUNTER — Ambulatory Visit (INDEPENDENT_AMBULATORY_CARE_PROVIDER_SITE_OTHER): Payer: Medicare HMO | Admitting: Adult Health

## 2014-12-20 VITALS — BP 116/78 | HR 70 | Temp 97.7°F | Ht 64.0 in | Wt 101.0 lb

## 2014-12-20 DIAGNOSIS — J449 Chronic obstructive pulmonary disease, unspecified: Secondary | ICD-10-CM | POA: Diagnosis not present

## 2014-12-20 NOTE — Progress Notes (Signed)
Reviewed & agree with plan  

## 2014-12-20 NOTE — Patient Instructions (Signed)
Continue on Spiriva daily  Follow up Dr. Elsworth Soho  In 6  months and As needed

## 2014-12-20 NOTE — Assessment & Plan Note (Signed)
Stable without flare  Smoking cessation  Plan  Continue on Spiriva daily  Follow up Dr. Elsworth Soho  In 6  months and As needed

## 2014-12-20 NOTE — Progress Notes (Signed)
Subjective:    Patient ID: [name] [name], female    DOB: 07-20-1927, 81 y.o.   MRN: [id number]  HPI  81 yo female smoker with COPD GOLD II w/ recurrent LLL atx .   07/2010 >>Spirometry >>FEV1 61%  She was first admitted 07/2010 for hypoxia & LLL atelectasis.  Ct chest showed Volume loss in the inferior left hemithorax with filling defect of the left lower and less so left upper lobe  Bronchoscopy showed LLL endobronchial narrowing, without discrete mass. Papule on rt mainstem bronchus was brushed - cytology all neg. BAL cx, afb, fungal neg  Re- admitted 09/2010 for pneumonia & LLL atx. Rpt bscopy >>thick mucoid secretions with difficulty passing scope. Cytology was neg for malignancy . Path showed AMYLOID DEPOSITION AND DYSTROPHIC CALCIFICATIONS.  CT sinuses 09/2010 neg  CT 06/2011.>>No residual lymphadenopathy. No evidence of metastatic breast cancer. Stable chronic lung disease with biapical scarring, diffuse calcifications of the tracheobronchial tree and mucous impaction of the lingular bronchus  Living will/DNR    12/20/2014 Follow up : COPD  Pt returns for 6 month follow up .  Pt c/o sinus drainage and a prod cough with white/clear mucus during the morning. Takes mucinex some, which helps. Breathing has been stable.   Denies any SOB, wheezing, chest congestion/tightness, fever, nausea or vomiting.  Remains on Spiriva daily .  Prevnar, Pneumovax , TDAP , Flu shot utd. Has medical alert button now.  Still smoking some. Discussed cessation .  Had few falsl at home following with neurology. Says she has spinal stenosis and legs get weak.   Last cxr in Oct with COPD changes   Past Medical History  Diagnosis Date  . POSTHERPETIC NEURALGIA 08/25/2006  . CARCINOMA, BASAL CELL 02/13/2006  . NEOPLASM, SKIN, UNCERTAIN BEHAVIOR [id number]  . HYPOTHYROIDISM 01/30/2007  . Unspecified vitamin D deficiency 02/07/2007  . HYPERLIPIDEMIA 03/10/2008  . UNSPECIFIED ANEMIA 03/18/2008  .  THROMBOCYTOPENIA 07/30/2008  . LOSS, HEARING NOS 08/25/2006  . HYPERTENSION 02/13/2006  . UNSTABLE ANGINA 12/12/2009  . CORONARY ARTERY DISEASE 02/13/2006  . CAROTID ARTERY DISEASE 10/03/2006  . RENAL ARTERY STENOSIS 08/01/2008  . PERIPHERAL VASCULAR DISEASE 02/13/2006  . Chronic rhinitis 12/13/2006  . CHRONIC OBSTRUCTIVE PULMONARY DISEASE, ACUTE EXACERBATION 11/17/2009  . DERMATITIS 09/25/2007  . DEGENERATIVE JOINT DISEASE 09/27/2006  . OSTEOARTHRITIS, HAND 12/16/2008  . SPINAL STENOSIS 11/01/2006  . LIVER FUNCTION TESTS, ABNORMAL 01/22/2009  . UNS ADVRS EFF UNS RX MEDICINAL&BIOLOGICAL SBSTNC 01/30/2007  . Personal history of malignant neoplasm of breast 07/24/2008  . NEPHROLITHIASIS, HX OF 02/27/2010  . Vitreous detachment   . Shingles     recurrent  . Head trauma     closed  . Personal history of urinary calculi   . Post-traumatic wound infection ([place]) 12/01/2010    Mild streaking superiorly and on one day of septra  Will give rocephin and add keflex  adn follow up   . Near syncope 09/18/2014   Current Outpatient Prescriptions on File Prior to Visit  Medication Sig Dispense Refill  . amLODipine (NORVASC) 10 MG tablet Take 1 tablet (10 mg total) by mouth daily. 90 tablet 2  . aspirin (BAYER ASPIRIN) 325 MG tablet Take 1 tablet (325 mg total) by mouth daily.    . carboxymethylcellulose (REFRESH PLUS) 0.5 % SOLN Place 1 drop into both eyes 4 (four) times daily.    . Cholecalciferol (VITAMIN D) 1000 UNITS capsule Take 1,000 Units by mouth daily.      . fish oil-omega-3 fatty  acids 1000 MG capsule Take 1 g by mouth daily.     . isosorbide mononitrate (IMDUR) 30 MG 24 hr tablet Take 1 tablet (30 mg total) by mouth daily. 90 tablet 3  . levalbuterol (XOPENEX HFA) 45 MCG/ACT inhaler INHALE TWO PUFFS EVERY 6 HOURS AS NEEDED FOR WHEEZING 15 g 6  . metoprolol succinate (TOPROL-XL) 25 MG 24 hr tablet Take 1 tablet (25 mg total) by mouth daily. 30 tablet 12  . tiotropium (SPIRIVA) 18 MCG inhalation  capsule Place 1 capsule (18 mcg total) into inhaler and inhale daily. 30 capsule 6  . traMADol (ULTRAM) 50 MG tablet TAKE ONE TABLET BY MOUTH EVERY EIGHT HOURS AS NEEDED FOR PAIN 40 tablet 0   No current facility-administered medications on file prior to visit.     Review of Systems Constitutional:   No  weight loss, night sweats,  Fevers, chills, + fatigue, or  lassitude.  HEENT:   No headaches,  Difficulty swallowing,  Tooth/dental problems, or  Sore throat,                No sneezing, itching, ear ache,  +nasal congestion, post nasal drip,   CV:  No chest pain,  Orthopnea, PND, swelling in lower extremities, anasarca, dizziness, palpitations, syncope.   GI  No heartburn, indigestion, abdominal pain, nausea, vomiting, diarrhea, change in bowel habits, loss of appetite, bloody stools.   Resp:  No coughing up of blood.  No change in color of mucus.  No wheezing.  No chest wall deformity  Skin: no rash or lesions.  GU: no dysuria, change in color of urine, no urgency or frequency.  No flank pain, no hematuria   MS:  No joint pain or swelling.  No decreased range of motion.  + gait issues   Psych:  No change in mood or affect. No depression or anxiety.  No memory loss.         Objective:   Physical Exam GEN: A/Ox3; pleasant , NAD,  Frail ,   HEENT:  [place]/AT,  EACs-clear, TMs-wnl, NOSE-clear, THROAT-clear, no lesions, no postnasal drip or exudate noted.   NECK:  Supple w/ fair ROM; no JVD; normal carotid impulses w/o bruits; no thyromegaly or nodules palpated; no lymphadenopathy.  RESP  Decreased BS in bases , few trace rhonchi , .no accessory muscle use, no dullness to percussion  CARD:  RRR, no m/r/g  , no peripheral edema, pulses intact, no cyanosis or clubbing.  GI:   Soft & nt; nml bowel sounds; no organomegaly or masses detected.  Musco: Warm bil, no deformities or joint swelling noted. , unsteady   Neuro: alert, no focal deficits noted.    Skin: Warm, no lesions  or rashes         Assessment & Plan:

## 2014-12-23 ENCOUNTER — Other Ambulatory Visit: Payer: Self-pay | Admitting: Adult Health

## 2015-03-11 DIAGNOSIS — H02835 Dermatochalasis of left lower eyelid: Secondary | ICD-10-CM | POA: Diagnosis not present

## 2015-03-11 DIAGNOSIS — H04123 Dry eye syndrome of bilateral lacrimal glands: Secondary | ICD-10-CM | POA: Diagnosis not present

## 2015-03-11 DIAGNOSIS — H35371 Puckering of macula, right eye: Secondary | ICD-10-CM | POA: Diagnosis not present

## 2015-03-11 DIAGNOSIS — H02831 Dermatochalasis of right upper eyelid: Secondary | ICD-10-CM | POA: Diagnosis not present

## 2015-03-11 DIAGNOSIS — H43813 Vitreous degeneration, bilateral: Secondary | ICD-10-CM | POA: Diagnosis not present

## 2015-03-11 DIAGNOSIS — H02834 Dermatochalasis of left upper eyelid: Secondary | ICD-10-CM | POA: Diagnosis not present

## 2015-03-11 DIAGNOSIS — H02832 Dermatochalasis of right lower eyelid: Secondary | ICD-10-CM | POA: Diagnosis not present

## 2015-04-02 ENCOUNTER — Ambulatory Visit (INDEPENDENT_AMBULATORY_CARE_PROVIDER_SITE_OTHER): Payer: Medicare HMO | Admitting: Cardiovascular Disease

## 2015-04-02 ENCOUNTER — Encounter: Payer: Self-pay | Admitting: Cardiovascular Disease

## 2015-04-02 VITALS — BP 160/72 | HR 62 | Ht 64.0 in | Wt 103.8 lb

## 2015-04-02 DIAGNOSIS — I739 Peripheral vascular disease, unspecified: Secondary | ICD-10-CM

## 2015-04-02 NOTE — Patient Instructions (Signed)

## 2015-04-02 NOTE — Progress Notes (Signed)
Cardiology Office Note Date:  04/02/2015   ID:  [name] [name], DOB February 20, 1927, MRN [id number]  PCP:  [name] Dawson, MD  Cardiologist:  [name] Mocha, MD    Chief Complaint  Patient presents with  . Leg Pain    History of Present Illness: [name] [name] is a 82 y.o. female who presents for follow-up of PAD. She's been followed for carotid and lower extremity PAD. She also has coronary disease and is followed by [name] [name].   She's been less active over the last several months. Car broke down and she's not getting out. She still does some work in her yard. Her primary complaint is leg weakness and unsteadiness. Legs also ache when she tries to get up from the sitting position.   Past Medical History  Diagnosis Date  . POSTHERPETIC NEURALGIA 08/25/2006  . CARCINOMA, BASAL CELL 02/13/2006  . NEOPLASM, SKIN, UNCERTAIN BEHAVIOR [id number]  . HYPOTHYROIDISM 01/30/2007  . Unspecified vitamin D deficiency 02/07/2007  . HYPERLIPIDEMIA 03/10/2008  . UNSPECIFIED ANEMIA 03/18/2008  . THROMBOCYTOPENIA 07/30/2008  . LOSS, HEARING NOS 08/25/2006  . HYPERTENSION 02/13/2006  . UNSTABLE ANGINA 12/12/2009  . CORONARY ARTERY DISEASE 02/13/2006  . CAROTID ARTERY DISEASE 10/03/2006  . RENAL ARTERY STENOSIS 08/01/2008  . PERIPHERAL VASCULAR DISEASE 02/13/2006  . Chronic rhinitis 12/13/2006  . CHRONIC OBSTRUCTIVE PULMONARY DISEASE, ACUTE EXACERBATION 11/17/2009  . DERMATITIS 09/25/2007  . DEGENERATIVE JOINT DISEASE 09/27/2006  . OSTEOARTHRITIS, HAND 12/16/2008  . SPINAL STENOSIS 11/01/2006  . LIVER FUNCTION TESTS, ABNORMAL 01/22/2009  . UNS ADVRS EFF UNS RX MEDICINAL&BIOLOGICAL SBSTNC 01/30/2007  . Personal history of malignant neoplasm of breast 07/24/2008  . NEPHROLITHIASIS, HX OF 02/27/2010  . Vitreous detachment   . Shingles     recurrent  . Head trauma     closed  . Personal history of urinary calculi   . Post-traumatic wound infection ([place]) 12/01/2010    Mild streaking superiorly  and on one day of septra  Will give rocephin and add keflex  adn follow up   . Near syncope 09/18/2014    Past Surgical History  Procedure Laterality Date  . Lumbar laminectomy    . Cataract extraction    . Abdominal hysterectomy    . Breast lumpectomy    . Tonsillectomy    . Coronary angioplasty with stent placement      bilat iliac stents, left renal artery and rt coronary artery last myoview 8/09 with EF 70%  . Skin cancer excision    . Breast lumpectomy  1998    Current Outpatient Prescriptions  Medication Sig Dispense Refill  . amLODipine (NORVASC) 10 MG tablet Take 1 tablet (10 mg total) by mouth daily. 90 tablet 2  . aspirin (BAYER ASPIRIN) 325 MG tablet Take 1 tablet (325 mg total) by mouth daily.    . carboxymethylcellulose (REFRESH PLUS) 0.5 % SOLN Place 1 drop into both eyes 4 (four) times daily.    . Cholecalciferol (VITAMIN D) 1000 UNITS capsule Take 1,000 Units by mouth daily.      . fish oil-omega-3 fatty acids 1000 MG capsule Take 1 g by mouth daily.     . isosorbide mononitrate (IMDUR) 30 MG 24 hr tablet Take 1 tablet (30 mg total) by mouth daily. 90 tablet 3  . metoprolol succinate (TOPROL-XL) 25 MG 24 hr tablet Take 1 tablet (25 mg total) by mouth daily. 30 tablet 12  . Multiple Vitamin (MULTIVITAMIN) capsule Take 1 capsule by mouth daily.    Marland Kitchen  tiotropium (SPIRIVA) 18 MCG inhalation capsule Place 1 capsule (18 mcg total) into inhaler and inhale daily. 30 capsule 6  . traMADol (ULTRAM) 50 MG tablet TAKE ONE TABLET BY MOUTH EVERY EIGHT HOURS AS NEEDED FOR PAIN 40 tablet 0  . XOPENEX HFA 45 MCG/ACT inhaler INHALE 2 PUFFS EVERY 6 HOURS AS NEEDED FOR WHEEZING 15 g 5   No current facility-administered medications for this visit.    Allergies:   Statins; Oxycodone; and Tequin   Social History:  The patient  reports that she quit smoking about 4 years ago. Her smoking use included Cigarettes. She has a 30 pack-year smoking history. She has never used smokeless tobacco.  She reports that she does not drink alcohol or use illicit drugs.   Family History:  The patient's  family history includes Breast cancer in her daughter; Tuberculosis in her father.    ROS:  Please see the history of present illness.  Otherwise, review of systems is positive for back pain, muscle pain, easy bruising, leg pain, balance problems.  All other systems are reviewed and negative.    PHYSICAL EXAM: VS:  BP 160/72 mmHg  Pulse 62  Ht 5\' 4"  (1.626 m)  Wt 47.083 kg (103 lb 12.8 oz)  BMI 17.81 kg/m2 , BMI Body mass index is 17.81 kg/(m^2). GEN: thin elderly woman, in no acute distress HEENT: normal Neck: no JVD, no masses. bilateral carotid bruits Cardiac: RRR with 2/6 SEM at the RUSB                Respiratory:  clear to auscultation bilaterally, normal work of breathing GI: soft, nontender, nondistended, + BS MS: no deformity or atrophy Ext: no pretibial edema, legs thin, pedal pulses nonpalpable Skin: warm and dry, no rash Neuro:  Strength and sensation are intact Psych: euthymic mood, full affect  EKG:  EKG is not ordered today.  Recent Labs: 08/02/2014: TSH 2.04 09/02/2014: Magnesium 2.0 10/11/2014: ALT 27 11/03/2014: BUN 22*; Creatinine, Ser 1.04*; Hemoglobin 13.0; Platelets 122*; Potassium 4.3; Sodium 140   Lipid Panel     Component Value Date/Time   CHOL 160 08/02/2014 0951   TRIG 57.0 08/02/2014 0951   TRIG 83 12/14/2005 1011   HDL 64.70 08/02/2014 0951   CHOLHDL 2 08/02/2014 0951   CHOLHDL 3.3 CALC 12/14/2005 1011   VLDL 11.4 08/02/2014 0951   LDLCALC 84 08/02/2014 0951      Wt Readings from Last 3 Encounters:  04/02/15 47.083 kg (103 lb 12.8 oz)  12/20/14 45.813 kg (101 lb)  11/05/14 44.634 kg (98 lb 6.4 oz)     Cardiac Studies Reviewed: ABI's 06/2014: 0.58 on the right and 0.66 on the left  Aorto-iliac Duplex 06/2014: heavy diffuse plaque bilaterally, patent iliac stents, no focal stenosis  ASSESSMENT AND PLAN: 1.  PAD with intermittent  claudication: no change in symptoms. She is less active. ABI's/Duplex studies are reviewed and are stable. FU one year.  2. Carotid artery disease. Followed by [name] [name] [name] in neurology. Duplex completed at [facility] in September 2016. No result available. MRA reviewed and there was no extracranial carotid stenosis identified.   3. Hypertension, renovascular. She has become very frail. I think we should be liberal with her BP as overtreatment could result in significant injury (she has fallen in the past year and remains unsteady).   4. Tobacco abuse. Ongoing and not interested in quitting.   Current medicines are reviewed with the patient today.  The patient does not have concerns regarding  medicines.  Labs/ tests ordered today include:  No orders of the defined types were placed in this encounter.    Disposition:   FU one year  Signed, [name] Mocha, MD  04/02/2015 10:45 AM    [address], [place], [place]  [address] Phone: [phone]; Fax: [phone]

## 2015-05-03 DIAGNOSIS — M961 Postlaminectomy syndrome, not elsewhere classified: Secondary | ICD-10-CM | POA: Diagnosis not present

## 2015-05-03 DIAGNOSIS — M5441 Lumbago with sciatica, right side: Secondary | ICD-10-CM | POA: Diagnosis not present

## 2015-05-03 DIAGNOSIS — M5136 Other intervertebral disc degeneration, lumbar region: Secondary | ICD-10-CM | POA: Diagnosis not present

## 2015-05-26 ENCOUNTER — Ambulatory Visit (INDEPENDENT_AMBULATORY_CARE_PROVIDER_SITE_OTHER): Payer: Medicare HMO | Admitting: Internal Medicine

## 2015-05-26 ENCOUNTER — Encounter: Payer: Self-pay | Admitting: Internal Medicine

## 2015-05-26 VITALS — BP 170/74 | Temp 98.0°F | Ht 64.0 in | Wt 100.2 lb

## 2015-05-26 DIAGNOSIS — M810 Age-related osteoporosis without current pathological fracture: Secondary | ICD-10-CM

## 2015-05-26 DIAGNOSIS — F17200 Nicotine dependence, unspecified, uncomplicated: Secondary | ICD-10-CM

## 2015-05-26 DIAGNOSIS — M4806 Spinal stenosis, lumbar region: Secondary | ICD-10-CM | POA: Diagnosis not present

## 2015-05-26 DIAGNOSIS — J440 Chronic obstructive pulmonary disease with acute lower respiratory infection: Secondary | ICD-10-CM

## 2015-05-26 DIAGNOSIS — R69 Illness, unspecified: Secondary | ICD-10-CM | POA: Diagnosis not present

## 2015-05-26 DIAGNOSIS — R636 Underweight: Secondary | ICD-10-CM | POA: Diagnosis not present

## 2015-05-26 DIAGNOSIS — J449 Chronic obstructive pulmonary disease, unspecified: Secondary | ICD-10-CM

## 2015-05-26 DIAGNOSIS — IMO0001 Reserved for inherently not codable concepts without codable children: Secondary | ICD-10-CM

## 2015-05-26 DIAGNOSIS — R54 Age-related physical debility: Secondary | ICD-10-CM

## 2015-05-26 DIAGNOSIS — M48061 Spinal stenosis, lumbar region without neurogenic claudication: Secondary | ICD-10-CM

## 2015-05-26 DIAGNOSIS — I10 Essential (primary) hypertension: Secondary | ICD-10-CM

## 2015-05-26 DIAGNOSIS — I7090 Unspecified atherosclerosis: Secondary | ICD-10-CM

## 2015-05-26 LAB — BASIC METABOLIC PANEL
BUN: 16 mg/dL (ref 6–23)
CALCIUM: 9.8 mg/dL (ref 8.4–10.5)
CO2: 29 mEq/L (ref 19–32)
CREATININE: 0.99 mg/dL (ref 0.40–1.20)
Chloride: 105 mEq/L (ref 96–112)
GFR: 56.59 mL/min — ABNORMAL LOW (ref >60.00)
Glucose, Bld: 115 mg/dL — ABNORMAL HIGH (ref 70–99)
Potassium: 3.8 mEq/L (ref 3.5–5.1)
Sodium: 142 mEq/L (ref 135–145)

## 2015-05-26 LAB — VITAMIN D 25 HYDROXY (VIT D DEFICIENCY, FRACTURES): VITD: 53.31 ng/mL (ref 30.00–100.00)

## 2015-05-26 NOTE — Patient Instructions (Signed)
Stopping tobacco is still best for your artery and lung health .  Osteoporosis       Have adequate vit d    We can check level today  If kidney function is ok   Then we can add  fosomax  Weekly  To help with prevneting fracture      Osteoporosis Osteoporosis is the thinning and loss of density in the bones. Osteoporosis makes the bones more brittle, fragile, and likely to break (fracture). Over time, osteoporosis can cause the bones to become so weak that they fracture after a simple fall. The bones most likely to fracture are the bones in the hip, wrist, and spine. CAUSES  The exact cause is not known. RISK FACTORS Anyone can develop osteoporosis. You may be at greater risk if you have a family history of the condition or have poor nutrition. You may also have a higher risk if you are:   Female.   80 years old or older.  A smoker.  Not physically active.   White or Asian.  Slender. SIGNS AND SYMPTOMS  A fracture might be the first sign of the disease, especially if it results from a fall or injury that would not usually cause a bone to break. Other signs and symptoms include:   Low back and neck pain.  Stooped posture.  Height loss. DIAGNOSIS  To make a diagnosis, your health care provider may:  Take a medical history.  Perform a physical exam.  Order tests, such as:  A bone mineral density test.  A dual-energy X-ray absorptiometry test. TREATMENT  The goal of osteoporosis treatment is to strengthen your bones to reduce your risk of a fracture. Treatment may involve:  Making lifestyle changes, such as:  Eating a diet rich in calcium.  Doing weight-bearing and muscle-strengthening exercises.  Stopping tobacco use.  Limiting alcohol intake.  Taking medicine to slow the process of bone loss or to increase bone density.  Monitoring your levels of calcium and vitamin D. HOME CARE INSTRUCTIONS  Include calcium and vitamin D in your diet. Calcium is  important for bone health, and vitamin D helps the body absorb calcium.  Perform weight-bearing and muscle-strengthening exercises as directed by your health care provider.  Do not use any tobacco products, including cigarettes, chewing tobacco, and electronic cigarettes. If you need help quitting, ask your health care provider.  Limit your alcohol intake.  Take medicines only as directed by your health care provider.  Keep all follow-up visits as directed by your health care provider. This is important.  Take precautions at home to lower your risk of falling, such as:  Keeping rooms well lit and clutter free.  Installing safety rails on stairs.  Using rubber mats in the bathroom and other areas that are often wet or slippery. SEEK IMMEDIATE MEDICAL CARE IF:  You fall or injure yourself.    This information is not intended to replace advice given to you by your health care provider. Make sure you discuss any questions you have with your health care provider.   Document Released: 10/28/2004 Document Revised: 02/08/2014 Document Reviewed: 06/28/2013 Elsevier Interactive Patient Education [employer].

## 2015-05-26 NOTE — Progress Notes (Signed)
Pre visit review using our clinic review tool, if applicable. No additional management support is needed unless otherwise documented below in the visit note.  Chief Complaint  Patient presents with  . Follow-up    meds  etc     HPI: [name] [name] 82 y.o.  Comes in for [name] [name]  comes in today for follow up of  multiple medical problems.  Has vascular disease with claudication hx of cad copd  underweight and intermittent tobacco use  And statin intolerance  Continues to smoke hard rto give up.  Less than a pack per day .    Has stress mostly financial   Hx of falling none recently  Has been told has osteoporosis    No recent frx  ? taking vit d ?  ASSESSMENT AND PLAN: cardiology eval  1. PAD with intermittent claudication: no change in symptoms. She is less active. ABI's/Duplex studies are reviewed and are stable. FU one year.  2. Carotid artery disease. Followed by Dr Jannifer Franklin in neurology. Duplex completed at [facility] in September 2016. No result available. MRA reviewed and there was no extracranial carotid stenosis identified.   3. Hypertension, renovascular. She has become very frail. I think we should be liberal with her BP as overtreatment could result in significant injury (she has fallen in the past year and remains unsteady).   4. Tobacco abuse. Ongoing and not interested in quitting.   Current medicines are reviewed with the patient today. The patient does not have concerns regarding medicines.  Labs/ tests ordered today include:  No orders of the defined types were placed in this encounter.   Disposition: FU one year  Signed, Sherren Mocha, MD  04/02/2015 10:45 AM  [address], [place], [place] [address] Phone: [phone]; Fax: [phone]  ROS: See pertinent positives and negatives per HPI. Neuro eval for nearsyncop  Will[facility] 8 16 neg eval excep ascd  To  Be on asa  Hx of falling     Past Medical History  Diagnosis Date  . POSTHERPETIC NEURALGIA 08/25/2006  . CARCINOMA, BASAL CELL 02/13/2006  . NEOPLASM, SKIN, UNCERTAIN BEHAVIOR [id number]  . HYPOTHYROIDISM 01/30/2007  . Unspecified vitamin D deficiency 02/07/2007  . HYPERLIPIDEMIA 03/10/2008  . UNSPECIFIED ANEMIA 03/18/2008  . THROMBOCYTOPENIA 07/30/2008  . LOSS, HEARING NOS 08/25/2006  . HYPERTENSION 02/13/2006  . UNSTABLE ANGINA 12/12/2009  . CORONARY ARTERY DISEASE 02/13/2006  . CAROTID ARTERY DISEASE 10/03/2006  . RENAL ARTERY STENOSIS 08/01/2008  . PERIPHERAL VASCULAR DISEASE 02/13/2006  . Chronic rhinitis 12/13/2006  . CHRONIC OBSTRUCTIVE PULMONARY DISEASE, ACUTE EXACERBATION 11/17/2009  . DERMATITIS 09/25/2007  . DEGENERATIVE JOINT DISEASE 09/27/2006  . OSTEOARTHRITIS, HAND 12/16/2008  . SPINAL STENOSIS 11/01/2006  . LIVER FUNCTION TESTS, ABNORMAL 01/22/2009  . UNS ADVRS EFF UNS RX MEDICINAL&BIOLOGICAL SBSTNC 01/30/2007  . Personal history of malignant neoplasm of breast 07/24/2008  . NEPHROLITHIASIS, HX OF 02/27/2010  . Vitreous detachment   . Shingles     recurrent  . Head trauma     closed  . Personal history of urinary calculi   . Post-traumatic wound infection ([place]) 12/01/2010    Mild streaking superiorly and on one day of septra  Will give rocephin and add keflex  adn follow up   . Near syncope 09/18/2014    Family History  Problem Relation Age of Onset  . Breast cancer Daughter   . Tuberculosis Father     Social History  Social History  . Marital Status: Widowed    Spouse Name: N/A  . Number of Children: N/A  . Years of Education: 13   Occupational History  . retired    Social History Main Topics  . Smoking status: Former Smoker -- 1.00 packs/day for 30 years    Types: Cigarettes    Quit date: 06/15/2010  . Smokeless tobacco: Never Used     Comment: 2-3 cigs per week/// 06/30/12  . Alcohol Use: No     Comment: socially  . Drug Use: No  . Sexual Activity: Not Asked   Other Topics  Concern  . None   Social History Narrative   Widowed   5 hours of sleep   Has friends checking on her   No Pets      Patient drinks about 6 cups of caffeine daily.   Patient is right handed.    Outpatient Prescriptions Prior to Visit  Medication Sig Dispense Refill  . amLODipine (NORVASC) 10 MG tablet Take 1 tablet (10 mg total) by mouth daily. 90 tablet 2  . carboxymethylcellulose (REFRESH PLUS) 0.5 % SOLN Place 1 drop into both eyes 4 (four) times daily.    . Cholecalciferol (VITAMIN D) 1000 UNITS capsule Take 1,000 Units by mouth daily.      . fish oil-omega-3 fatty acids 1000 MG capsule Take 1 g by mouth daily.     . isosorbide mononitrate (IMDUR) 30 MG 24 hr tablet Take 1 tablet (30 mg total) by mouth daily. 90 tablet 3  . metoprolol succinate (TOPROL-XL) 25 MG 24 hr tablet Take 1 tablet (25 mg total) by mouth daily. 30 tablet 12  . Multiple Vitamin (MULTIVITAMIN) capsule Take 1 capsule by mouth daily.    Marland Kitchen tiotropium (SPIRIVA) 18 MCG inhalation capsule Place 1 capsule (18 mcg total) into inhaler and inhale daily. 30 capsule 6  . traMADol (ULTRAM) 50 MG tablet TAKE ONE TABLET BY MOUTH EVERY EIGHT HOURS AS NEEDED FOR PAIN 40 tablet 0  . XOPENEX HFA 45 MCG/ACT inhaler INHALE 2 PUFFS EVERY 6 HOURS AS NEEDED FOR WHEEZING 15 g 5  . aspirin (BAYER ASPIRIN) 325 MG tablet Take 1 tablet (325 mg total) by mouth daily. (Patient not taking: Reported on 05/26/2015)     No facility-administered medications prior to visit.     EXAM:  BP 170/74 mmHg  Temp(Src) 98 F (36.7 C) (Oral)  Ht 5\' 4"  (1.626 m)  Wt 100 lb 3.2 oz (45.45 kg)  BMI 17.19 kg/m2  Body mass index is 17.19 kg/(m^2).  GENERAL: vitals reviewed and listed above, alert, oriented, appears well hydrated and in no acute distress very slender  Alert  Pleasant  HEENT: atraumatic, conjunctiva  clear, no obvious abnormalities on inspection of external nose and ears OP : no lesion edema or exudate  NECK: no obvious masses on  inspection palpation  LUNGS: no wheezes, rales or rhonchi,  ocsass ua sounds dec bs bilaterally  CV: HRRR, no clubbing cyanosis or  peripheral edema nl cap refill  MS: moves all extremities   Very little [place]ak[place] tissue   undersweight  PSYCH: pleasant and cooperative, n   Wt Readings from Last 3 Encounters:  05/26/15 100 lb 3.2 oz (45.45 kg)  04/02/15 103 lb 12.8 oz (47.083 kg)  12/20/14 101 lb (45.813 kg)   BP Readings from Last 3 Encounters:  05/26/15 170/74  04/02/15 160/72  12/20/14 116/78   Lab Results  Component Value Date   WBC 7.0 11/03/2014  HGB 13.0 11/03/2014   HCT 40.2 11/03/2014   PLT 122* 11/03/2014   GLUCOSE 115* 05/26/2015   CHOL 160 08/02/2014   TRIG 57.0 08/02/2014   HDL 64.70 08/02/2014   LDLCALC 84 08/02/2014   ALT 27 10/11/2014   AST 27 10/11/2014   NA 142 05/26/2015   K 3.8 05/26/2015   CL 105 05/26/2015   CREATININE 0.99 05/26/2015   BUN 16 05/26/2015   CO2 29 05/26/2015   TSH 2.04 08/02/2014   INR 1.04 09/20/2010   HGBA1C 6.0 08/15/2012    ASSESSMENT AND PLAN:  Discussed the following assessment and plan:  Osteoporosis - check renal function and if ok begin bone building nmed  - Plan: Basic metabolic panel, VITAMIN D 25 Hydroxy (Vit-D Deficiency, Fractures)  Underweight  Spinal stenosis of lumbar region  Chronic obstructive pulmonary disease, unspecified COPD type ([place])  Tobacco use disorder  Atherosclerosis  Essential hypertension - see note dr Burt Knack caution with over treatment up today ? stress other   Age factor Platelet  creatinine  . Due for fu  with labs Total visit 70mins > 50% spent counseling and coordinating care as indicated in above note and in instructions to patient .   -Patient advised to return or notify health care team  if symptoms worsen ,persist or new concerns arise.  Patient Instructions  Stopping tobacco is still best for your artery and lung health .  Osteoporosis       Have adequate vit d    We can check  level today  If kidney function is ok   Then we can add  fosomax  Weekly  To help with prevneting fracture      Osteoporosis Osteoporosis is the thinning and loss of density in the bones. Osteoporosis makes the bones more brittle, fragile, and likely to break (fracture). Over time, osteoporosis can cause the bones to become so weak that they fracture after a simple fall. The bones most likely to fracture are the bones in the hip, wrist, and spine. CAUSES  The exact cause is not known. RISK FACTORS Anyone can develop osteoporosis. You may be at greater risk if you have a family history of the condition or have poor nutrition. You may also have a higher risk if you are:   Female.   97 years old or older.  A smoker.  Not physically active.   White or Asian.  Slender. SIGNS AND SYMPTOMS  A fracture might be the first sign of the disease, especially if it results from a fall or injury that would not usually cause a bone to break. Other signs and symptoms include:   Low back and neck pain.  Stooped posture.  Height loss. DIAGNOSIS  To make a diagnosis, your health care provider may:  Take a medical history.  Perform a physical exam.  Order tests, such as:  A bone mineral density test.  A dual-energy X-ray absorptiometry test. TREATMENT  The goal of osteoporosis treatment is to strengthen your bones to reduce your risk of a fracture. Treatment may involve:  Making lifestyle changes, such as:  Eating a diet rich in calcium.  Doing weight-bearing and muscle-strengthening exercises.  Stopping tobacco use.  Limiting alcohol intake.  Taking medicine to slow the process of bone loss or to increase bone density.  Monitoring your levels of calcium and vitamin D. HOME CARE INSTRUCTIONS  Include calcium and vitamin D in your diet. Calcium is important for bone health, and vitamin D helps the  body absorb calcium.  Perform weight-bearing and muscle-strengthening  exercises as directed by your health care provider.  Do not use any tobacco products, including cigarettes, chewing tobacco, and electronic cigarettes. If you need help quitting, ask your health care provider.  Limit your alcohol intake.  Take medicines only as directed by your health care provider.  Keep all follow-up visits as directed by your health care provider. This is important.  Take precautions at home to lower your risk of falling, such as:  Keeping rooms well lit and clutter free.  Installing safety rails on stairs.  Using rubber mats in the bathroom and other areas that are often wet or slippery. SEEK IMMEDIATE MEDICAL CARE IF:  You fall or injure yourself.    This information is not intended to replace advice given to you by your health care provider. Make sure you discuss any questions you have with your health care provider.   Document Released: 10/28/2004 Document Revised: 02/08/2014 Document Reviewed: 06/28/2013 Elsevier Interactive Patient Education 2016 [place] K. Damarrion Mimbs M.D.

## 2015-05-28 DIAGNOSIS — M5441 Lumbago with sciatica, right side: Secondary | ICD-10-CM | POA: Diagnosis not present

## 2015-05-29 ENCOUNTER — Other Ambulatory Visit: Payer: Self-pay | Admitting: Family Medicine

## 2015-05-29 MED ORDER — ALENDRONATE SODIUM 70 MG PO TABS: 70.0000 mg | ORAL_TABLET | ORAL | Status: DC

## 2015-06-02 DIAGNOSIS — D0439 Carcinoma in situ of skin of other parts of face: Secondary | ICD-10-CM | POA: Diagnosis not present

## 2015-06-02 DIAGNOSIS — D485 Neoplasm of uncertain behavior of skin: Secondary | ICD-10-CM | POA: Diagnosis not present

## 2015-06-10 ENCOUNTER — Telehealth: Payer: Self-pay | Admitting: Internal Medicine

## 2015-06-10 NOTE — Telephone Encounter (Signed)
Acknowledged . Please take off med list as patient preference  thanks

## 2015-06-10 NOTE — Telephone Encounter (Signed)
Pt said she is not taking the following med alendronate (FOSAMAX) 70 MG tablet  She said she read the side effects and it will do her more harm than good

## 2015-06-18 ENCOUNTER — Ambulatory Visit (INDEPENDENT_AMBULATORY_CARE_PROVIDER_SITE_OTHER): Payer: Medicare HMO | Admitting: Pulmonary Disease

## 2015-06-18 ENCOUNTER — Encounter: Payer: Self-pay | Admitting: Pulmonary Disease

## 2015-06-18 VITALS — BP 122/78 | HR 70 | Ht 64.0 in | Wt 99.2 lb

## 2015-06-18 DIAGNOSIS — J449 Chronic obstructive pulmonary disease, unspecified: Secondary | ICD-10-CM

## 2015-06-18 DIAGNOSIS — Z0000 Encounter for general adult medical examination without abnormal findings: Secondary | ICD-10-CM | POA: Diagnosis not present

## 2015-06-18 DIAGNOSIS — F17200 Nicotine dependence, unspecified, uncomplicated: Secondary | ICD-10-CM

## 2015-06-18 DIAGNOSIS — I10 Essential (primary) hypertension: Secondary | ICD-10-CM | POA: Diagnosis not present

## 2015-06-18 DIAGNOSIS — I209 Angina pectoris, unspecified: Secondary | ICD-10-CM | POA: Diagnosis not present

## 2015-06-18 DIAGNOSIS — I259 Chronic ischemic heart disease, unspecified: Secondary | ICD-10-CM | POA: Diagnosis not present

## 2015-06-18 NOTE — Assessment & Plan Note (Signed)
Continue Spiriva daily Xopenex on when necessary basis only

## 2015-06-18 NOTE — Assessment & Plan Note (Signed)
Smoking cessation again emphasized 

## 2015-06-18 NOTE — Patient Instructions (Signed)
Use spiriva daily

## 2015-06-18 NOTE — Progress Notes (Signed)
   Subjective:    Patient ID: [name] [name], female    DOB: 15-Jan-1928, 82 y.o.   MRN: [id number]  HPI  82 yo female smoker with COPD GOLD II w/ recurrent LLL atx .   07/2010 >>Spirometry >>FEV1 61%  She was first admitted 07/2010 for hypoxia & LLL atelectasis.  Ct chest showed Volume loss in the inferior left hemithorax with filling defect of the left lower and less so left upper lobe  Bronchoscopy showed LLL endobronchial narrowing, without discrete mass. Papule on rt mainstem bronchus was brushed - cytology all neg. BAL cx, afb, fungal neg  Re- admitted 09/2010 for pneumonia & LLL atx. Rpt bscopy >>thick mucoid secretions with difficulty passing scope. Cytology was neg for malignancy . Path showed AMYLOID DEPOSITION AND DYSTROPHIC CALCIFICATIONS.  CT sinuses 09/2010 neg  CT 06/2011.>>No residual lymphadenopathy. No evidence of metastatic breast cancer. Stable chronic lung disease with biapical scarring, diffuse calcifications of the tracheobronchial tree and mucous impaction of the lingular bronchus  Living will/DNR   06/18/2015  Chief Complaint  Patient presents with  . Follow-up    breathing is doing fine.  Wants to discuss RX that she received by Dr. Regis Bill and doesn't want to take it. CAT: 0   She has an active lifestyle-she is able to carry out all activities including mowing her own grass. 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 Pleasant, well-nourished,  in no distress ENT - no lesions, no post nasal drip Neck: No JVD, no thyromegaly, no carotid bruits Lungs: no use of accessory muscles, no dullness to percussion, clear without rales or rhonchi  Cardiovascular: Rhythm regular, heart sounds  normal, no murmurs or gallops, no peripheral edema Musculoskeletal: No deformities, no cyanosis or clubbing        Assessment & Plan:

## 2015-06-26 DIAGNOSIS — D0430 Carcinoma in situ of skin of unspecified part of face: Secondary | ICD-10-CM | POA: Diagnosis not present

## 2015-06-26 DIAGNOSIS — L570 Actinic keratosis: Secondary | ICD-10-CM | POA: Diagnosis not present

## 2015-07-11 ENCOUNTER — Encounter: Payer: Self-pay | Admitting: Family Medicine

## 2015-07-11 DIAGNOSIS — Z853 Personal history of malignant neoplasm of breast: Secondary | ICD-10-CM | POA: Diagnosis not present

## 2015-07-11 DIAGNOSIS — Z5111 Encounter for antineoplastic chemotherapy: Secondary | ICD-10-CM | POA: Diagnosis not present

## 2015-07-11 DIAGNOSIS — Z1231 Encounter for screening mammogram for malignant neoplasm of breast: Secondary | ICD-10-CM | POA: Diagnosis not present

## 2015-07-11 LAB — HM MAMMOGRAPHY

## 2015-07-25 ENCOUNTER — Encounter (HOSPITAL_COMMUNITY): Payer: Self-pay | Admitting: Emergency Medicine

## 2015-07-25 ENCOUNTER — Ambulatory Visit (HOSPITAL_COMMUNITY): Payer: Medicare HMO

## 2015-07-25 ENCOUNTER — Ambulatory Visit (HOSPITAL_COMMUNITY)
Admission: EM | Admit: 2015-07-25 | Discharge: 2015-07-25 | Disposition: A | Discharge status: Home / Self Care | Payer: Medicare HMO | Attending: Family Medicine | Admitting: Family Medicine

## 2015-07-25 DIAGNOSIS — Z79899 Other long term (current) drug therapy: Secondary | ICD-10-CM | POA: Diagnosis not present

## 2015-07-25 DIAGNOSIS — I10 Essential (primary) hypertension: Secondary | ICD-10-CM | POA: Diagnosis not present

## 2015-07-25 DIAGNOSIS — Z85828 Personal history of other malignant neoplasm of skin: Secondary | ICD-10-CM | POA: Diagnosis not present

## 2015-07-25 DIAGNOSIS — J441 Chronic obstructive pulmonary disease with (acute) exacerbation: Secondary | ICD-10-CM | POA: Insufficient documentation

## 2015-07-25 DIAGNOSIS — Z7982 Long term (current) use of aspirin: Secondary | ICD-10-CM | POA: Insufficient documentation

## 2015-07-25 DIAGNOSIS — R0602 Shortness of breath: Secondary | ICD-10-CM | POA: Diagnosis not present

## 2015-07-25 DIAGNOSIS — Z87891 Personal history of nicotine dependence: Secondary | ICD-10-CM | POA: Insufficient documentation

## 2015-07-25 DIAGNOSIS — Z888 Allergy status to other drugs, medicaments and biological substances status: Secondary | ICD-10-CM | POA: Insufficient documentation

## 2015-07-25 DIAGNOSIS — Z885 Allergy status to narcotic agent status: Secondary | ICD-10-CM | POA: Diagnosis not present

## 2015-07-25 DIAGNOSIS — R05 Cough: Secondary | ICD-10-CM | POA: Diagnosis not present

## 2015-07-25 DIAGNOSIS — Z853 Personal history of malignant neoplasm of breast: Secondary | ICD-10-CM | POA: Insufficient documentation

## 2015-07-25 DIAGNOSIS — I2510 Atherosclerotic heart disease of native coronary artery without angina pectoris: Secondary | ICD-10-CM | POA: Diagnosis not present

## 2015-07-25 DIAGNOSIS — I739 Peripheral vascular disease, unspecified: Secondary | ICD-10-CM | POA: Diagnosis not present

## 2015-07-25 MED ORDER — DEXAMETHASONE SODIUM PHOSPHATE 10 MG/ML IJ SOLN
8.0000 mg | Freq: Once | INTRAMUSCULAR | Status: AC
  Administered 2015-07-25: 8 mg via INTRAMUSCULAR

## 2015-07-25 MED ORDER — DEXAMETHASONE SODIUM PHOSPHATE 10 MG/ML IJ SOLN
INTRAMUSCULAR | Status: AC
  Filled 2015-07-25: qty 1

## 2015-07-25 MED ORDER — SODIUM CHLORIDE 0.9 % IN NEBU
INHALATION_SOLUTION | RESPIRATORY_TRACT | Status: AC
  Filled 2015-07-25: qty 3

## 2015-07-25 MED ORDER — LEVALBUTEROL TARTRATE 45 MCG/ACT IN AERO: 2.0000 | INHALATION_SPRAY | Freq: Four times a day (QID) | RESPIRATORY_TRACT | Status: DC | PRN

## 2015-07-25 MED ORDER — IPRATROPIUM-ALBUTEROL 0.5-2.5 (3) MG/3ML IN SOLN
3.0000 mL | Freq: Once | RESPIRATORY_TRACT | Status: AC
  Administered 2015-07-25: 3 mL via RESPIRATORY_TRACT

## 2015-07-25 MED ORDER — ALBUTEROL SULFATE (2.5 MG/3ML) 0.083% IN NEBU
INHALATION_SOLUTION | RESPIRATORY_TRACT | Status: AC
  Filled 2015-07-25: qty 3

## 2015-07-25 MED ORDER — ALBUTEROL SULFATE (2.5 MG/3ML) 0.083% IN NEBU
2.5000 mg | INHALATION_SOLUTION | Freq: Once | RESPIRATORY_TRACT | Status: AC
  Administered 2015-07-25: 2.5 mg via RESPIRATORY_TRACT

## 2015-07-25 MED ORDER — IPRATROPIUM-ALBUTEROL 0.5-2.5 (3) MG/3ML IN SOLN
RESPIRATORY_TRACT | Status: AC
  Filled 2015-07-25: qty 3

## 2015-07-25 MED ORDER — PREDNISONE 20 MG PO TABS: ORAL_TABLET | ORAL | Status: DC

## 2015-07-25 NOTE — Discharge Instructions (Signed)
Chronic Obstructive Pulmonary Disease Exacerbation Chronic obstructive pulmonary disease (COPD) is a common lung condition in which airflow from the lungs is limited. COPD is a general term that can be used to describe many different lung problems that limit airflow, including chronic bronchitis and emphysema. COPD exacerbations are episodes when breathing symptoms become much worse and require extra treatment. Without treatment, COPD exacerbations can be life threatening, and frequent COPD exacerbations can cause further damage to your lungs. CAUSES 1. Respiratory infections. 2. Exposure to smoke. 3. Exposure to air pollution, chemical fumes, or dust. Sometimes there is no apparent cause or trigger. RISK FACTORS  Smoking cigarettes.  Older age.  Frequent prior COPD exacerbations. SIGNS AND SYMPTOMS  Increased coughing.  Increased thick spit (sputum) production.  Increased wheezing.  Increased shortness of breath.  Rapid breathing.  Chest tightness. DIAGNOSIS Your medical history, a physical exam, and tests will help your health care provider make a diagnosis. Tests may include:  A chest X-ray.  Basic lab tests.  Sputum testing.  An arterial blood gas test. TREATMENT Depending on the severity of your COPD exacerbation, you may need to be admitted to a hospital for treatment. Some of the treatments commonly used to treat COPD exacerbations are:   Antibiotic medicines.  Bronchodilators. These are drugs that expand the air passages. They may be given with an inhaler or nebulizer. Spacer devices may be needed to help improve drug delivery.  Corticosteroid medicines.  Supplemental oxygen therapy.  Airway clearing techniques, such as noninvasive ventilation (NIV) and positive expiratory pressure (PEP). These provide respiratory support through a mask or other noninvasive device. HOME CARE INSTRUCTIONS  Do not smoke. Quitting smoking is very important to prevent COPD from  getting worse and exacerbations from happening as often.  Avoid exposure to all substances that irritate the airway, especially to tobacco smoke.  If you were prescribed an antibiotic medicine, finish it all even if you start to feel better.  Take all medicines as directed by your health care provider.It is important to use correct technique with inhaled medicines.  Drink enough fluids to keep your urine clear or pale yellow (unless you have a medical condition that requires fluid restriction).  Use a cool mist vaporizer. This makes it easier to clear your chest when you cough.  If you have a home nebulizer and oxygen, continue to use them as directed.  Maintain all necessary vaccinations to prevent infections.  Exercise regularly.  Eat a healthy diet.  Keep all follow-up appointments as directed by your health care provider. SEEK IMMEDIATE MEDICAL CARE IF:  You have worsening shortness of breath.  You have trouble talking.  You have severe chest pain.  You have blood in your sputum.  You have a fever.  You have weakness, vomit repeatedly, or faint.  You feel confused.  You continue to get worse. MAKE SURE YOU:  Understand these instructions.  Will watch your condition.  Will get help right away if you are not doing well or get worse.   This information is not intended to replace advice given to you by your health care provider. Make sure you discuss any questions you have with your health care provider.   Document Released: 11/15/2006 Document Revised: 02/08/2014 Document Reviewed: 09/22/2012 Elsevier Interactive Patient Education 2016 [employer].  How to Use an Inhaler Using your inhaler correctly is very important. Good technique will make sure that the medicine reaches your lungs.  HOW TO USE AN INHALER: 4. Take the cap off  the inhaler. 5. If this is the first time using your inhaler, you need to prime it. Shake the inhaler for 5 seconds. Release four  puffs into the air, away from your face. Ask your doctor for help if you have questions. 6. Shake the inhaler for 5 seconds. 7. Turn the inhaler so the bottle is above the mouthpiece. 8. Put your pointer finger on top of the bottle. Your thumb holds the bottom of the inhaler. 9. Open your mouth. 10. Either hold the inhaler away from your mouth (the width of 2 fingers) or place your lips tightly around the mouthpiece. Ask your doctor which way to use your inhaler. 11. Breathe out as much air as possible. 12. Breathe in and push down on the bottle 1 time to release the medicine. You will feel the medicine go in your mouth and throat. 13. Continue to take a deep breath in very slowly. Try to fill your lungs. 14. After you have breathed in completely, hold your breath for 10 seconds. This will help the medicine to settle in your lungs. If you cannot hold your breath for 10 seconds, hold it for as long as you can before you breathe out. 15. Breathe out slowly, through pursed lips. Whistling is an example of pursed lips. 16. If your doctor has told you to take more than 1 puff, wait at least 15-30 seconds between puffs. This will help you get the best results from your medicine. Do not use the inhaler more than your doctor tells you to. 17. Put the cap back on the inhaler. 18. Follow the directions from your doctor or from the inhaler package about cleaning the inhaler. If you use more than one inhaler, ask your doctor which inhalers to use and what order to use them in. Ask your doctor to help you figure out when you will need to refill your inhaler.  If you use a steroid inhaler, always rinse your mouth with water after your last puff, gargle and spit out the water. Do not swallow the water. GET HELP IF:  The inhaler medicine only partially helps to stop wheezing or shortness of breath.  You are having trouble using your inhaler.  You have some increase in thick spit (phlegm). GET HELP RIGHT AWAY  IF:  The inhaler medicine does not help your wheezing or shortness of breath or you have tightness in your chest.  You have dizziness, headaches, or fast heart rate.  You have chills, fever, or night sweats.  You have a large increase of thick spit, or your thick spit is bloody. MAKE SURE YOU:   Understand these instructions.  Will watch your condition.  Will get help right away if you are not doing well or get worse.   This information is not intended to replace advice given to you by your health care provider. Make sure you discuss any questions you have with your health care provider.   Document Released: 10/28/2007 Document Revised: 11/08/2012 Document Reviewed: 08/17/2012 Elsevier Interactive Patient Education [employer].

## 2015-07-25 NOTE — ED Notes (Signed)
Patient reports trouble breathing, started yesterday.  Also reports cough and soreness in back and chest with coughing

## 2015-07-25 NOTE — ED Provider Notes (Signed)
CSN: [id number]     Arrival date & time 07/25/15  1257 History   First MD Initiated Contact with Patient 07/25/15 1334     Chief Complaint  Patient presents with  . Shortness of Breath   (Consider location/radiation/quality/duration/timing/severity/associated sxs/prior Treatment) HPI Comments: 82 year old female with a history of COPD with exacerbation, hypertension, DJD, spinal stenosis, abnormal LFTs, personal history of malignant neoplasm of the breast, nephrolithiasis, hypothyroidism, unstable angina with CAD, carotid artery disease, renal artery stenosis and peripheral vascular disease presents to the urgent care today stating that she cannot breathe beginning 2 days ago. He is complaining congestion, cough and the inability to expectorate the secretions. She states she is using her supper Reva and probably more than is prescribed. When asked about rescue inhalers she is not sure what she has available but believes it is Xopenex. She does not recall using this recently. She does not have available albuterol or other medications or nebulizers. She is complaining of increasing weakness, fatigue, decreased ability to ambulate more than just a short distance associated with her dyspnea. She is alert and talkative but in short sentences. Denies chest pain.   Past Medical History  Diagnosis Date  . POSTHERPETIC NEURALGIA 08/25/2006  . CARCINOMA, BASAL CELL 02/13/2006  . NEOPLASM, SKIN, UNCERTAIN BEHAVIOR [id number]  . HYPOTHYROIDISM 01/30/2007  . Unspecified vitamin D deficiency 02/07/2007  . HYPERLIPIDEMIA 03/10/2008  . UNSPECIFIED ANEMIA 03/18/2008  . THROMBOCYTOPENIA 07/30/2008  . LOSS, HEARING NOS 08/25/2006  . HYPERTENSION 02/13/2006  . UNSTABLE ANGINA 12/12/2009  . CORONARY ARTERY DISEASE 02/13/2006  . CAROTID ARTERY DISEASE 10/03/2006  . RENAL ARTERY STENOSIS 08/01/2008  . PERIPHERAL VASCULAR DISEASE 02/13/2006  . Chronic rhinitis 12/13/2006  . CHRONIC OBSTRUCTIVE PULMONARY DISEASE, ACUTE  EXACERBATION 11/17/2009  . DERMATITIS 09/25/2007  . DEGENERATIVE JOINT DISEASE 09/27/2006  . OSTEOARTHRITIS, HAND 12/16/2008  . SPINAL STENOSIS 11/01/2006  . LIVER FUNCTION TESTS, ABNORMAL 01/22/2009  . UNS ADVRS EFF UNS RX MEDICINAL&BIOLOGICAL SBSTNC 01/30/2007  . Personal history of malignant neoplasm of breast 07/24/2008  . NEPHROLITHIASIS, HX OF 02/27/2010  . Vitreous detachment   . Shingles     recurrent  . Head trauma     closed  . Personal history of urinary calculi   . Post-traumatic wound infection ([place]) 12/01/2010    Mild streaking superiorly and on one day of septra  Will give rocephin and add keflex  adn follow up   . Near syncope 09/18/2014   Past Surgical History  Procedure Laterality Date  . Lumbar laminectomy    . Cataract extraction    . Abdominal hysterectomy    . Breast lumpectomy    . Tonsillectomy    . Coronary angioplasty with stent placement      bilat iliac stents, left renal artery and rt coronary artery last myoview 8/09 with EF 70%  . Skin cancer excision    . Breast lumpectomy  1998   Family History  Problem Relation Age of Onset  . Breast cancer Daughter   . Tuberculosis Father    Social History  Substance Use Topics  . Smoking status: Former Smoker -- 1.00 packs/day for 30 years    Types: Cigarettes    Quit date: 06/15/2010  . Smokeless tobacco: Never Used     Comment: 2-3 cigs per week/// 06/30/12  . Alcohol Use: No     Comment: socially   OB History    No data available     Review of Systems  Constitutional: Positive for activity change  and fatigue. Negative for fever.  HENT: Positive for sore throat. Negative for ear pain and postnasal drip.   Eyes: Negative.   Respiratory: Positive for cough, chest tightness, shortness of breath and wheezing.   Cardiovascular: Negative for palpitations.  Gastrointestinal: Negative.   Musculoskeletal: Negative for neck pain.  Skin: Negative.   Neurological: Positive for weakness and  light-headedness. Negative for seizures.    Allergies  Statins; Oxycodone; Hydrocodone-acetaminophen; and Tequin  Home Medications   Prior to Admission medications   Medication Sig Start Date End Date Taking? Authorizing Provider  amLODipine (NORVASC) 10 MG tablet Take 1 tablet (10 mg total) by mouth daily. 09/30/14   Lelon Perla, MD  aspirin (BAYER ASPIRIN) 325 MG tablet Take 1 tablet (325 mg total) by mouth daily. 09/30/14   Kathrynn Ducking, MD  carboxymethylcellulose (REFRESH PLUS) 0.5 % SOLN Place 1 drop into both eyes 4 (four) times daily.    Historical Provider, MD  Cholecalciferol (VITAMIN D) 1000 UNITS capsule Take 1,000 Units by mouth daily.      Historical Provider, MD  fish oil-omega-3 fatty acids 1000 MG capsule Take 1 g by mouth daily.     Historical Provider, MD  isosorbide mononitrate (IMDUR) 30 MG 24 hr tablet Take 1 tablet (30 mg total) by mouth daily. 11/06/14   Lelon Perla, MD  levalbuterol [facility] HFA) 45 MCG/ACT inhaler Inhale 2 puffs into the lungs every 6 (six) hours as needed for wheezing. 07/25/15   Janne Napoleon, NP  metoprolol succinate (TOPROL-XL) 25 MG 24 hr tablet Take 1 tablet (25 mg total) by mouth daily. 09/20/14   Lelon Perla, MD  Multiple Vitamin (MULTIVITAMIN) capsule Take 1 capsule by mouth daily.    Historical Provider, MD  predniSONE (DELTASONE) 20 MG tablet Take 2 tabs po on first day, 2 tabs second day, 2 tabs third day, 1 tab fourth day, 1 tab 5th day. Take with food. 07/25/15   Janne Napoleon, NP  tiotropium (SPIRIVA) 18 MCG inhalation capsule Place 1 capsule (18 mcg total) into inhaler and inhale daily. 07/24/14   Tammy S Parrett, NP  traMADol (ULTRAM) 50 MG tablet TAKE ONE TABLET BY MOUTH EVERY EIGHT HOURS AS NEEDED FOR PAIN 06/28/12   Burnis Medin, MD   Meds Ordered and Administered this Visit   Medications  ipratropium-albuterol (DUONEB) 0.5-2.5 (3) MG/3ML nebulizer solution 3 mL (3 mLs Nebulization Given 07/25/15 1411)  dexamethasone  (DECADRON) injection 8 mg (8 mg Intramuscular Given 07/25/15 1411)  albuterol (PROVENTIL) (2.5 MG/3ML) 0.083% nebulizer solution 2.5 mg (2.5 mg Nebulization Given 07/25/15 1604)    BP 127/63 mmHg  Pulse 78  Temp(Src) 98.2 F (36.8 C) (Oral)  Resp 26  SpO2 94% No data found.   Physical Exam  Constitutional: She is oriented to person, place, and time. She appears well-developed and well-nourished. No distress.  Thin, frail, little shaky and needing assistance in getting onto the exam table.  HENT:  Although the patient states she has thrush I am unable see any evidence such as white spots are patches. There is no deep erythema or exudates to the oropharynx. Airway is widely patent.  Eyes: EOM are normal.  Neck: Normal range of motion. Neck supple.  Cardiovascular: Normal rate and regular rhythm.   Pulmonary/Chest: Effort normal. She has wheezes.  Increase in respiratory effort. Inspiratory and expiratory rhonchi and wheezes. Prolonged expiratory phase. There is bilateral "rubbery rhonchi" versus pleural friction rub. Suprasternal and supraclavicular retraction with inspiration.  Musculoskeletal: She  exhibits no edema.  Neurological: She is alert and oriented to person, place, and time. No cranial nerve deficit. She exhibits normal muscle tone.  Skin: Skin is warm and dry. No rash noted. No erythema.  Nursing note and vitals reviewed.   ED Course  Procedures (including critical care time)  Labs Review Labs Reviewed - No data to display  Imaging Review Dg Chest 2 View  07/25/2015  CLINICAL DATA:  Cough, shortness of breath, dyspnea, hypoxia, COPD EXAM: CHEST  2 VIEW COMPARISON:  11/03/2014 FINDINGS: Normal heart size, mediastinal contours, and pulmonary vascularity. Atherosclerotic calcification aorta. Emphysematous changes with stable biapical scarring. Minimal atelectasis LEFT base. No acute infiltrate, pleural effusion or pneumothorax. Surgical clips RIGHT axilla. Bones  demineralized with thoracolumbar scoliosis. IMPRESSION: COPD changes with biapical scarring and minimal LEFT base atelectasis. Aortic atherosclerosis. Electronically Signed   By: Lavonia Dana M.D.   On: 07/25/2015 15:30     Visual Acuity Review  Right Eye Distance:   Left Eye Distance:   Bilateral Distance:    Right Eye Near:   Left Eye Near:    Bilateral Near:         MDM   1. COPD with exacerbation ([place])     Auscultation after the patient received the initial DuoNeb revealed much improved air movement and decrease in wheeze. She still has moderate rhonchi bilaterally. She states she feels much much better and is breathing better and wants to leave. She will receive one more albuterol 0.25 mg. Chest x-ray results as above reviewed with patient. She is afebrile and states that she is getting antsy and is wishing to leave as soon as possible. Auscultation after the second albuterol nebulizer patient states she continues to feel well. There is modest improvement with air movement. She continues to have some rhonchi. She is insistent upon leaving now as soon as possible, she feels better and wants to go home. She is being discharged in improved and stable condition.     Janne Napoleon, NP 07/25/15 1620

## 2015-07-25 NOTE — ED Notes (Signed)
Notified amanda in hospital radiology that patient coming for xray

## 2015-08-04 DIAGNOSIS — L989 Disorder of the skin and subcutaneous tissue, unspecified: Secondary | ICD-10-CM | POA: Diagnosis not present

## 2015-08-04 DIAGNOSIS — L821 Other seborrheic keratosis: Secondary | ICD-10-CM | POA: Diagnosis not present

## 2015-08-04 DIAGNOSIS — Z85828 Personal history of other malignant neoplasm of skin: Secondary | ICD-10-CM | POA: Diagnosis not present

## 2015-08-19 NOTE — Progress Notes (Signed)
HPI: FU coronary artery disease, status post stenting of the right coronary artery in December 2005. Patient had cardiac catheterization in November of 2011 secondary to chest pain which revealed left main normal. The LAD had proximal luminal irregularities. First diagonal was large with ostial 25% stenosis. Circumflex in the AV groove had mid long 25% stenosis. Obtuse marginal was large and normal. The right coronary artery with long in-stent 30% stenosis. PDA was moderate size and normal. Posterolateral was small and normal. EF 65. Echo in June of 2012 showed normal LV function, mild AI and MR. Last renal Dopplers were in August 2010. Renal arteries showed no stenosis. There was a right renal mass. However dedicated ultrasound showed a renal cyst. She also has a history of cerebrovascular disease. Her most recent carotid Dopplers were performed in Sept 2014 showed a 40-59% bilateral stenosis. Followup by neurology. She also has peripheral vascular disease and has seen Dr. Burt Knack. An arteriogram revealed occlusion of the left SFA. It was unfavorable for intervention and medical therapy was recommended if possible. ABIs in May 2016 showed moderate decrease bilaterally. Patient is being treated medically. Since I last saw her, She has mild dyspnea on exertion but no orthopnea, PND or pedal edema. She denies chest pain or syncope.  Current Outpatient Prescriptions  Medication Sig Dispense Refill  . amLODipine (NORVASC) 10 MG tablet Take 1 tablet (10 mg total) by mouth daily. 90 tablet 2  . aspirin (BAYER ASPIRIN) 325 MG tablet Take 1 tablet (325 mg total) by mouth daily.    . carboxymethylcellulose (REFRESH PLUS) 0.5 % SOLN Place 1 drop into both eyes 4 (four) times daily.    . Cholecalciferol (VITAMIN D) 1000 UNITS capsule Take 1,000 Units by mouth daily.      . fish oil-omega-3 fatty acids 1000 MG capsule Take 1 g by mouth daily.     . isosorbide mononitrate (IMDUR) 30 MG 24 hr tablet Take 1  tablet (30 mg total) by mouth daily. 90 tablet 3  . levalbuterol (XOPENEX HFA) 45 MCG/ACT inhaler Inhale 2 puffs into the lungs every 6 (six) hours as needed for wheezing. 1 Inhaler 0  . metoprolol succinate (TOPROL-XL) 25 MG 24 hr tablet Take 1 tablet (25 mg total) by mouth daily. 30 tablet 12  . tiotropium (SPIRIVA) 18 MCG inhalation capsule Place 1 capsule (18 mcg total) into inhaler and inhale daily. 30 capsule 6  . traMADol (ULTRAM) 50 MG tablet TAKE ONE TABLET BY MOUTH EVERY EIGHT HOURS AS NEEDED FOR PAIN 40 tablet 0   No current facility-administered medications for this visit.     Past Medical History  Diagnosis Date  . POSTHERPETIC NEURALGIA 08/25/2006  . CARCINOMA, BASAL CELL 02/13/2006  . NEOPLASM, SKIN, UNCERTAIN BEHAVIOR [id number]  . HYPOTHYROIDISM 01/30/2007  . Unspecified vitamin D deficiency 02/07/2007  . HYPERLIPIDEMIA 03/10/2008  . UNSPECIFIED ANEMIA 03/18/2008  . THROMBOCYTOPENIA 07/30/2008  . LOSS, HEARING NOS 08/25/2006  . HYPERTENSION 02/13/2006  . UNSTABLE ANGINA 12/12/2009  . CORONARY ARTERY DISEASE 02/13/2006  . CAROTID ARTERY DISEASE 10/03/2006  . RENAL ARTERY STENOSIS 08/01/2008  . PERIPHERAL VASCULAR DISEASE 02/13/2006  . Chronic rhinitis 12/13/2006  . CHRONIC OBSTRUCTIVE PULMONARY DISEASE, ACUTE EXACERBATION 11/17/2009  . DERMATITIS 09/25/2007  . DEGENERATIVE JOINT DISEASE 09/27/2006  . OSTEOARTHRITIS, HAND 12/16/2008  . SPINAL STENOSIS 11/01/2006  . LIVER FUNCTION TESTS, ABNORMAL 01/22/2009  . UNS ADVRS EFF UNS RX MEDICINAL&BIOLOGICAL SBSTNC 01/30/2007  . Personal history of malignant neoplasm of breast 07/24/2008  .  NEPHROLITHIASIS, HX OF 02/27/2010  . Vitreous detachment   . Shingles     recurrent  . Head trauma     closed  . Personal history of urinary calculi   . Post-traumatic wound infection ([place]) 12/01/2010    Mild streaking superiorly and on one day of septra  Will give rocephin and add keflex  adn follow up   . Near syncope 09/18/2014    Past Surgical  History  Procedure Laterality Date  . Lumbar laminectomy    . Cataract extraction    . Abdominal hysterectomy    . Breast lumpectomy    . Tonsillectomy    . Coronary angioplasty with stent placement      bilat iliac stents, left renal artery and rt coronary artery last myoview 8/09 with EF 70%  . Skin cancer excision    . Breast lumpectomy  1998    Social History   Social History  . Marital Status: Widowed    Spouse Name: N/A  . Number of Children: N/A  . Years of Education: 13   Occupational History  . retired    Social History Main Topics  . Smoking status: Former Smoker -- 1.00 packs/day for 30 years    Types: Cigarettes    Quit date: 06/15/2010  . Smokeless tobacco: Never Used     Comment: 2-3 cigs per week/// 06/30/12  . Alcohol Use: No     Comment: socially  . Drug Use: No  . Sexual Activity: Not on file   Other Topics Concern  . Not on file   Social History Narrative   Widowed   5 hours of sleep   Has friends checking on her   No Pets      Patient drinks about 6 cups of caffeine daily.   Patient is right handed.    Family History  Problem Relation Age of Onset  . Breast cancer Daughter   . Tuberculosis Father     ROS: Patient complains of weakness, weight loss of 10 pounds and cough/sinusitis but no fevers or chills, hemoptysis, dysphasia, odynophagia, melena, hematochezia, dysuria, hematuria, rash, seizure activity, orthopnea, PND, pedal edema, claudication. Remaining systems are negative.  Physical Exam: Well-developed frail in no acute distress.  Skin is warm and dry.  HEENT is normal.  Neck is supple.  Chest with diminished BS throughout Cardiovascular exam is irregular Abdominal exam nontender or distended. No masses palpated. Extremities show no edema. neuro grossly intact  ECG-Sinus rhythm with PACs. Normal axis. Septal infarct.   A/P  1 Hyperlipidemia-continue diet. Intolerant to statins.  2 hypertension-blood pressure  controlled. Continue present medications.  3 coronary artery disease-continue aspirin. Intolerant to statins.  4 Carotid artery disease-now followed by neurology. Continue aspirin.  5 renal artery stenosis-continue aspirin.  6 peripheral vascular disease-followed by Dr. Burt Knack.  7 tobacco abuse-patient counseled on discontinuing.  8 Sinusitis/bronchitis-we have provided a Z-Pak today. She will follow-up with pulmonary for this issue.  9 weight loss-perivascular follow-up with primary care.  Kirk Ruths, MD

## 2015-08-21 ENCOUNTER — Ambulatory Visit (INDEPENDENT_AMBULATORY_CARE_PROVIDER_SITE_OTHER): Payer: Medicare HMO | Admitting: Cardiology

## 2015-08-21 ENCOUNTER — Encounter: Payer: Self-pay | Admitting: Cardiology

## 2015-08-21 VITALS — BP 122/52 | HR 80 | Ht 64.0 in | Wt 97.0 lb

## 2015-08-21 DIAGNOSIS — I2510 Atherosclerotic heart disease of native coronary artery without angina pectoris: Secondary | ICD-10-CM

## 2015-08-21 DIAGNOSIS — I10 Essential (primary) hypertension: Secondary | ICD-10-CM

## 2015-08-21 DIAGNOSIS — E785 Hyperlipidemia, unspecified: Secondary | ICD-10-CM

## 2015-08-21 DIAGNOSIS — I739 Peripheral vascular disease, unspecified: Secondary | ICD-10-CM

## 2015-08-21 MED ORDER — AZITHROMYCIN 500 MG PO TABS: 500.0000 mg | ORAL_TABLET | Freq: Every day | ORAL | Status: DC

## 2015-08-21 NOTE — Patient Instructions (Signed)
Medication Instructions:   TAKE Z PACK 500 MG ONE DAY THEN 250 MG FOR FOUR DAYS   If you need a refill on your cardiac medications before your next appointment, please call your pharmacy.  Labwork: NONE ORDER TODAY    Testing/Procedures: NONE ORDER TODAY    Follow-Up: Your physician wants you to follow-up in: [name] [name] will receive a reminder letter in the mail two months in advance. If you don't receive a letter, please call our office to schedule the follow-up appointment.     Any Other Special Instructions Will Be Listed Below (If Applicable).

## 2015-09-10 DIAGNOSIS — L853 Xerosis cutis: Secondary | ICD-10-CM | POA: Diagnosis not present

## 2015-09-10 DIAGNOSIS — L570 Actinic keratosis: Secondary | ICD-10-CM | POA: Diagnosis not present

## 2015-09-10 DIAGNOSIS — L821 Other seborrheic keratosis: Secondary | ICD-10-CM | POA: Diagnosis not present

## 2015-09-10 DIAGNOSIS — L299 Pruritus, unspecified: Secondary | ICD-10-CM | POA: Diagnosis not present

## 2015-09-10 DIAGNOSIS — Z85828 Personal history of other malignant neoplasm of skin: Secondary | ICD-10-CM | POA: Diagnosis not present

## 2015-09-10 DIAGNOSIS — D1800 Hemangioma unspecified site: Secondary | ICD-10-CM | POA: Diagnosis not present

## 2015-09-10 DIAGNOSIS — D225 Melanocytic nevi of trunk: Secondary | ICD-10-CM | POA: Diagnosis not present

## 2015-10-08 ENCOUNTER — Other Ambulatory Visit: Payer: Self-pay | Admitting: Adult Health

## 2015-10-09 ENCOUNTER — Other Ambulatory Visit: Payer: Self-pay | Admitting: Adult Health

## 2015-10-10 ENCOUNTER — Other Ambulatory Visit: Payer: Self-pay | Admitting: Pulmonary Disease

## 2015-10-27 DIAGNOSIS — C44729 Squamous cell carcinoma of skin of left lower limb, including hip: Secondary | ICD-10-CM | POA: Diagnosis not present

## 2015-10-27 DIAGNOSIS — Z23 Encounter for immunization: Secondary | ICD-10-CM | POA: Diagnosis not present

## 2015-10-27 DIAGNOSIS — L570 Actinic keratosis: Secondary | ICD-10-CM | POA: Diagnosis not present

## 2015-10-27 DIAGNOSIS — D485 Neoplasm of uncertain behavior of skin: Secondary | ICD-10-CM | POA: Diagnosis not present

## 2015-10-31 ENCOUNTER — Telehealth: Payer: Self-pay | Admitting: *Deleted

## 2015-10-31 ENCOUNTER — Other Ambulatory Visit: Payer: Self-pay | Admitting: *Deleted

## 2015-10-31 MED ORDER — METOPROLOL SUCCINATE ER 25 MG PO TB24: 25.0000 mg | ORAL_TABLET | Freq: Every day | ORAL | 2 refills | Status: DC

## 2015-10-31 MED ORDER — ISOSORBIDE MONONITRATE ER 30 MG PO TB24: 30.0000 mg | ORAL_TABLET | Freq: Every day | ORAL | 2 refills | Status: DC

## 2015-11-02 ENCOUNTER — Other Ambulatory Visit: Payer: Self-pay | Admitting: Cardiovascular Disease

## 2015-11-03 DIAGNOSIS — C44729 Squamous cell carcinoma of skin of left lower limb, including hip: Secondary | ICD-10-CM | POA: Diagnosis not present

## 2015-11-03 MED ORDER — NITROGLYCERIN 0.4 MG SL SUBL: 0.4000 mg | SUBLINGUAL_TABLET | SUBLINGUAL | 1 refills | Status: DC | PRN

## 2015-11-03 NOTE — Telephone Encounter (Signed)
Rx for SL NTG sent to the pharmacy.

## 2015-11-06 ENCOUNTER — Other Ambulatory Visit: Payer: Self-pay | Admitting: Pulmonary Disease

## 2015-11-07 ENCOUNTER — Other Ambulatory Visit: Payer: Self-pay | Admitting: Cardiovascular Disease

## 2015-11-07 NOTE — Telephone Encounter (Signed)
Dr. Crenshaw pt. °

## 2015-11-10 ENCOUNTER — Other Ambulatory Visit: Payer: Self-pay | Admitting: *Deleted

## 2015-11-10 MED ORDER — AMLODIPINE BESYLATE 10 MG PO TABS: 10.0000 mg | ORAL_TABLET | Freq: Every day | ORAL | 1 refills | Status: DC

## 2015-11-11 ENCOUNTER — Ambulatory Visit: Payer: Medicare HMO | Admitting: Family Medicine

## 2015-11-11 ENCOUNTER — Other Ambulatory Visit: Payer: Self-pay | Admitting: *Deleted

## 2015-11-11 MED ORDER — TIOTROPIUM BROMIDE MONOHYDRATE 18 MCG IN CAPS: ORAL_CAPSULE | RESPIRATORY_TRACT | 0 refills | Status: DC

## 2015-11-11 NOTE — Telephone Encounter (Signed)
Paper refill on Spivriva rx sent to pharmacy

## 2015-11-12 ENCOUNTER — Emergency Department (HOSPITAL_COMMUNITY): Payer: Medicare HMO

## 2015-11-12 ENCOUNTER — Encounter (HOSPITAL_COMMUNITY): Payer: Self-pay

## 2015-11-12 ENCOUNTER — Inpatient Hospital Stay (HOSPITAL_COMMUNITY)
Admission: EM | Admit: 2015-11-12 | Discharge: 2015-11-18 | DRG: 177 | Disposition: A | Discharge status: Home Health | Payer: Medicare HMO | Attending: Internal Medicine | Admitting: Internal Medicine

## 2015-11-12 DIAGNOSIS — R0600 Dyspnea, unspecified: Secondary | ICD-10-CM

## 2015-11-12 DIAGNOSIS — Z85828 Personal history of other malignant neoplasm of skin: Secondary | ICD-10-CM

## 2015-11-12 DIAGNOSIS — E785 Hyperlipidemia, unspecified: Secondary | ICD-10-CM | POA: Diagnosis present

## 2015-11-12 DIAGNOSIS — J9811 Atelectasis: Secondary | ICD-10-CM | POA: Diagnosis not present

## 2015-11-12 DIAGNOSIS — I110 Hypertensive heart disease with heart failure: Secondary | ICD-10-CM | POA: Diagnosis present

## 2015-11-12 DIAGNOSIS — I2510 Atherosclerotic heart disease of native coronary artery without angina pectoris: Secondary | ICD-10-CM | POA: Diagnosis not present

## 2015-11-12 DIAGNOSIS — J441 Chronic obstructive pulmonary disease with (acute) exacerbation: Secondary | ICD-10-CM | POA: Diagnosis present

## 2015-11-12 DIAGNOSIS — J9601 Acute respiratory failure with hypoxia: Secondary | ICD-10-CM | POA: Diagnosis present

## 2015-11-12 DIAGNOSIS — I10 Essential (primary) hypertension: Secondary | ICD-10-CM | POA: Diagnosis present

## 2015-11-12 DIAGNOSIS — R069 Unspecified abnormalities of breathing: Secondary | ICD-10-CM | POA: Diagnosis not present

## 2015-11-12 DIAGNOSIS — F17200 Nicotine dependence, unspecified, uncomplicated: Secondary | ICD-10-CM | POA: Diagnosis present

## 2015-11-12 DIAGNOSIS — E43 Unspecified severe protein-calorie malnutrition: Secondary | ICD-10-CM | POA: Insufficient documentation

## 2015-11-12 DIAGNOSIS — Z79899 Other long term (current) drug therapy: Secondary | ICD-10-CM

## 2015-11-12 DIAGNOSIS — Z853 Personal history of malignant neoplasm of breast: Secondary | ICD-10-CM

## 2015-11-12 DIAGNOSIS — R0602 Shortness of breath: Secondary | ICD-10-CM | POA: Diagnosis not present

## 2015-11-12 DIAGNOSIS — Z87442 Personal history of urinary calculi: Secondary | ICD-10-CM

## 2015-11-12 DIAGNOSIS — I5031 Acute diastolic (congestive) heart failure: Secondary | ICD-10-CM | POA: Diagnosis present

## 2015-11-12 DIAGNOSIS — Z87891 Personal history of nicotine dependence: Secondary | ICD-10-CM | POA: Diagnosis not present

## 2015-11-12 DIAGNOSIS — R1310 Dysphagia, unspecified: Secondary | ICD-10-CM

## 2015-11-12 DIAGNOSIS — K224 Dyskinesia of esophagus: Secondary | ICD-10-CM | POA: Diagnosis not present

## 2015-11-12 DIAGNOSIS — Z803 Family history of malignant neoplasm of breast: Secondary | ICD-10-CM

## 2015-11-12 DIAGNOSIS — Z955 Presence of coronary angioplasty implant and graft: Secondary | ICD-10-CM

## 2015-11-12 DIAGNOSIS — E559 Vitamin D deficiency, unspecified: Secondary | ICD-10-CM | POA: Diagnosis present

## 2015-11-12 DIAGNOSIS — Z7982 Long term (current) use of aspirin: Secondary | ICD-10-CM

## 2015-11-12 DIAGNOSIS — J90 Pleural effusion, not elsewhere classified: Secondary | ICD-10-CM | POA: Diagnosis not present

## 2015-11-12 DIAGNOSIS — Z681 Body mass index (BMI) 19 or less, adult: Secondary | ICD-10-CM

## 2015-11-12 DIAGNOSIS — Z23 Encounter for immunization: Secondary | ICD-10-CM | POA: Diagnosis not present

## 2015-11-12 DIAGNOSIS — E876 Hypokalemia: Secondary | ICD-10-CM | POA: Diagnosis present

## 2015-11-12 DIAGNOSIS — J690 Pneumonitis due to inhalation of food and vomit: Secondary | ICD-10-CM | POA: Diagnosis not present

## 2015-11-12 DIAGNOSIS — F17210 Nicotine dependence, cigarettes, uncomplicated: Secondary | ICD-10-CM | POA: Diagnosis present

## 2015-11-12 LAB — CBC WITH DIFFERENTIAL/PLATELET
BASOS ABS: 0 10*3/uL (ref 0.0–0.1)
Basophils Relative: 0 %
EOS ABS: 0.1 10*3/uL (ref 0.0–0.7)
EOS PCT: 1 %
HCT: 40.9 % (ref 36.0–46.0)
Hemoglobin: 13.2 g/dL (ref 12.0–15.0)
Lymphocytes Relative: 13 %
Lymphs Abs: 1.1 10*3/uL (ref 0.7–4.0)
MCH: 29.7 pg (ref 26.0–34.0)
MCHC: 32.3 g/dL (ref 30.0–36.0)
MCV: 92.1 fL (ref 78.0–100.0)
MONO ABS: 0.9 10*3/uL (ref 0.1–1.0)
Monocytes Relative: 10 %
Neutro Abs: 6.4 10*3/uL (ref 1.7–7.7)
Neutrophils Relative %: 76 %
Platelets: 160 10*3/uL (ref 150–400)
RBC: 4.44 MIL/uL (ref 3.87–5.11)
RDW: 14.7 % (ref 11.5–15.5)
WBC: 8.4 10*3/uL (ref 4.0–10.5)

## 2015-11-12 LAB — I-STAT VENOUS BLOOD GAS, ED
ACID-BASE EXCESS: 2 mmol/L (ref 0.0–2.0)
Bicarbonate: 28 mmol/L (ref 20.0–28.0)
O2 SAT: 43 %
TCO2: 29 mmol/L (ref 0–100)
pCO2, Ven: 49.8 mmHg (ref 44.0–60.0)
pH, Ven: 7.357 (ref 7.250–7.430)
pO2, Ven: 26 mmHg — CL (ref 32.0–45.0)

## 2015-11-12 LAB — I-STAT CG4 LACTIC ACID, ED
LACTIC ACID, VENOUS: 1.36 mmol/L (ref 0.5–1.9)
Lactic Acid, Venous: 1.41 mmol/L (ref 0.5–1.9)

## 2015-11-12 LAB — INFLUENZA PANEL BY PCR (TYPE A & B)
H1N1 flu by pcr: NOT DETECTED
INFLBPCR: NEGATIVE
Influenza A By PCR: NEGATIVE

## 2015-11-12 LAB — BRAIN NATRIURETIC PEPTIDE: B NATRIURETIC PEPTIDE 5: 251.9 pg/mL — AB (ref 0.0–100.0)

## 2015-11-12 LAB — COMPREHENSIVE METABOLIC PANEL
ALK PHOS: 94 U/L (ref 38–126)
ALT: 19 U/L (ref 14–54)
ANION GAP: 8 (ref 5–15)
AST: 28 U/L (ref 15–41)
Albumin: 3.1 g/dL — ABNORMAL LOW (ref 3.5–5.0)
BILIRUBIN TOTAL: 0.5 mg/dL (ref 0.3–1.2)
BUN: 20 mg/dL (ref 6–20)
CALCIUM: 9.4 mg/dL (ref 8.9–10.3)
CO2: 26 mmol/L (ref 22–32)
Chloride: 108 mmol/L (ref 101–111)
Creatinine, Ser: 0.95 mg/dL (ref 0.44–1.00)
GFR calc Af Amer: >60 mL/min (ref >60)
GFR calc non Af Amer: 53 mL/min — ABNORMAL LOW (ref >60)
GLUCOSE: 109 mg/dL — AB (ref 65–99)
Potassium: 4.3 mmol/L (ref 3.5–5.1)
SODIUM: 142 mmol/L (ref 135–145)
TOTAL PROTEIN: 6.2 g/dL — AB (ref 6.5–8.1)

## 2015-11-12 LAB — I-STAT TROPONIN, ED
TROPONIN I, POC: 0.02 ng/mL (ref 0.00–0.08)
Troponin i, poc: 0 ng/mL (ref 0.00–0.08)

## 2015-11-12 MED ORDER — TIOTROPIUM BROMIDE MONOHYDRATE 18 MCG IN CAPS
18.0000 ug | ORAL_CAPSULE | Freq: Every day | RESPIRATORY_TRACT | Status: DC
  Administered 2015-11-13 – 2015-11-18 (×6): 18 ug via RESPIRATORY_TRACT
  Filled 2015-11-12 (×3): qty 5

## 2015-11-12 MED ORDER — LEVALBUTEROL HCL 1.25 MG/0.5ML IN NEBU
1.2500 mg | INHALATION_SOLUTION | Freq: Once | RESPIRATORY_TRACT | Status: AC
  Administered 2015-11-12: 1.25 mg via RESPIRATORY_TRACT
  Filled 2015-11-12: qty 0.5

## 2015-11-12 MED ORDER — SODIUM CHLORIDE 0.9 % IV BOLUS (SEPSIS)
1000.0000 mL | Freq: Once | INTRAVENOUS | Status: AC
  Administered 2015-11-12: 1000 mL via INTRAVENOUS

## 2015-11-12 MED ORDER — ISOSORBIDE MONONITRATE ER 30 MG PO TB24
30.0000 mg | ORAL_TABLET | Freq: Every day | ORAL | Status: DC
Start: 2015-11-13 — End: 2015-11-18
  Administered 2015-11-13 – 2015-11-18 (×6): 30 mg via ORAL
  Filled 2015-11-12 (×6): qty 1

## 2015-11-12 MED ORDER — DM-GUAIFENESIN ER 30-600 MG PO TB12
1.0000 | ORAL_TABLET | Freq: Two times a day (BID) | ORAL | Status: DC
  Administered 2015-11-12 – 2015-11-18 (×12): 1 via ORAL
  Filled 2015-11-12 (×12): qty 1

## 2015-11-12 MED ORDER — LEVALBUTEROL HCL 1.25 MG/0.5ML IN NEBU
1.2500 mg | INHALATION_SOLUTION | Freq: Four times a day (QID) | RESPIRATORY_TRACT | Status: DC | PRN
Start: 2015-11-12 — End: 2015-11-13
  Administered 2015-11-12: 1.25 mg via RESPIRATORY_TRACT
  Filled 2015-11-12: qty 0.5

## 2015-11-12 MED ORDER — TRAMADOL HCL 50 MG PO TABS
50.0000 mg | ORAL_TABLET | Freq: Three times a day (TID) | ORAL | Status: DC | PRN
  Filled 2015-11-12 (×2): qty 1

## 2015-11-12 MED ORDER — DOXYCYCLINE HYCLATE 100 MG IV SOLR
100.0000 mg | Freq: Two times a day (BID) | INTRAVENOUS | Status: DC
  Administered 2015-11-13 – 2015-11-17 (×9): 100 mg via INTRAVENOUS
  Filled 2015-11-12 (×10): qty 100

## 2015-11-12 MED ORDER — METHYLPREDNISOLONE SODIUM SUCC 125 MG IJ SOLR
60.0000 mg | Freq: Four times a day (QID) | INTRAMUSCULAR | Status: DC
  Administered 2015-11-12 – 2015-11-15 (×11): 60 mg via INTRAVENOUS
  Filled 2015-11-12 (×11): qty 2

## 2015-11-12 MED ORDER — METOPROLOL SUCCINATE ER 25 MG PO TB24
25.0000 mg | ORAL_TABLET | Freq: Every day | ORAL | Status: DC
Start: 2015-11-13 — End: 2015-11-18
  Administered 2015-11-13 – 2015-11-18 (×6): 25 mg via ORAL
  Filled 2015-11-12 (×6): qty 1

## 2015-11-12 MED ORDER — DOXYCYCLINE HYCLATE 100 MG IV SOLR
100.0000 mg | Freq: Once | INTRAVENOUS | Status: DC
  Filled 2015-11-12: qty 100

## 2015-11-12 MED ORDER — DOXYCYCLINE HYCLATE 100 MG IV SOLR
100.0000 mg | Freq: Once | INTRAVENOUS | Status: AC
  Administered 2015-11-12: 100 mg via INTRAVENOUS
  Filled 2015-11-12: qty 100

## 2015-11-12 MED ORDER — HEPARIN SODIUM (PORCINE) 5000 UNIT/ML IJ SOLN
5000.0000 [IU] | Freq: Three times a day (TID) | INTRAMUSCULAR | Status: DC
  Administered 2015-11-12 – 2015-11-18 (×17): 5000 [IU] via SUBCUTANEOUS
  Filled 2015-11-12 (×18): qty 1

## 2015-11-12 MED ORDER — AMLODIPINE BESYLATE 10 MG PO TABS
10.0000 mg | ORAL_TABLET | Freq: Every day | ORAL | Status: DC
  Administered 2015-11-13 – 2015-11-18 (×6): 10 mg via ORAL
  Filled 2015-11-12 (×7): qty 1

## 2015-11-12 MED ORDER — ASPIRIN 81 MG PO CHEW
81.0000 mg | CHEWABLE_TABLET | Freq: Every day | ORAL | Status: DC
  Administered 2015-11-13 – 2015-11-18 (×6): 81 mg via ORAL
  Filled 2015-11-12 (×6): qty 1

## 2015-11-12 MED ORDER — IPRATROPIUM BROMIDE 0.02 % IN SOLN
1.0000 mg | Freq: Once | RESPIRATORY_TRACT | Status: AC
  Administered 2015-11-12: 1 mg via RESPIRATORY_TRACT
  Filled 2015-11-12: qty 5

## 2015-11-12 MED ORDER — IPRATROPIUM BROMIDE 0.02 % IN SOLN
0.5000 mg | Freq: Four times a day (QID) | RESPIRATORY_TRACT | Status: DC | PRN
  Administered 2015-11-12 – 2015-11-13 (×2): 0.5 mg via RESPIRATORY_TRACT
  Filled 2015-11-12 (×3): qty 2.5

## 2015-11-12 MED ORDER — POLYVINYL ALCOHOL 1.4 % OP SOLN
1.0000 [drp] | Freq: Four times a day (QID) | OPHTHALMIC | Status: DC
  Administered 2015-11-12 – 2015-11-18 (×19): 1 [drp] via OPHTHALMIC
  Filled 2015-11-12: qty 15

## 2015-11-12 MED ORDER — INFLUENZA VAC SPLIT QUAD 0.5 ML IM SUSY
0.5000 mL | PREFILLED_SYRINGE | INTRAMUSCULAR | Status: DC
  Filled 2015-11-12: qty 0.5

## 2015-11-12 NOTE — ED Provider Notes (Addendum)
[facility] DEPT Provider Note   CSN: [id number] Arrival date & time: 11/12/15  1142     History   Chief Complaint Chief Complaint  Patient presents with  . Shortness of Breath    HPI [name] [name] is a 82 y.o. female.  HPI 82 year old female with extensive past medical history including COPD who presents with shortness of breath. Patient states that over the last several days, she has felt generally weak. Over the last several hours, she had acute worsening and shortness of breath. She states that she has had a mildly productive cough also worsened over the last several hours. She called EMS due to her work of breathing. On their arrival, patient was satting less than 80% on room air. She was given Solu-Medrol, magnesium, and albuterol. She slowly improved to 88% on nonrebreather en route. Currently, she feels improved but endorses persistent shortness of breath. Denies any chest pain associated with this. Denies any fever. She has had mild sputum production. No lower extremity edema.  Past Medical History:  Diagnosis Date  . CARCINOMA, BASAL CELL 02/13/2006  . CAROTID ARTERY DISEASE 10/03/2006  . CHRONIC OBSTRUCTIVE PULMONARY DISEASE, ACUTE EXACERBATION 11/17/2009  . Chronic rhinitis 12/13/2006  . CORONARY ARTERY DISEASE 02/13/2006  . DEGENERATIVE JOINT DISEASE 09/27/2006  . DERMATITIS 09/25/2007  . Head trauma    closed  . HYPERLIPIDEMIA 03/10/2008  . HYPERTENSION 02/13/2006  . HYPOTHYROIDISM 01/30/2007  . LIVER FUNCTION TESTS, ABNORMAL 01/22/2009  . LOSS, HEARING NOS 08/25/2006  . Near syncope 09/18/2014  . NEOPLASM, SKIN, UNCERTAIN BEHAVIOR [id number]  . NEPHROLITHIASIS, HX OF 02/27/2010  . OSTEOARTHRITIS, HAND 12/16/2008  . PERIPHERAL VASCULAR DISEASE 02/13/2006  . Personal history of malignant neoplasm of breast 07/24/2008  . Personal history of urinary calculi   . Post-traumatic wound infection 12/01/2010   Mild streaking superiorly and on one day of septra  Will  give rocephin and add keflex  adn follow up   . POSTHERPETIC NEURALGIA 08/25/2006  . RENAL ARTERY STENOSIS 08/01/2008  . Shingles    recurrent  . SPINAL STENOSIS 11/01/2006  . THROMBOCYTOPENIA 07/30/2008  . UNS ADVRS EFF UNS RX MEDICINAL&BIOLOGICAL SBSTNC 01/30/2007  . UNSPECIFIED ANEMIA 03/18/2008  . Unspecified vitamin D deficiency 02/07/2007  . UNSTABLE ANGINA 12/12/2009  . Vitreous detachment     Patient Active Problem List   Diagnosis Date Noted  . COPD exacerbation ([place]) 11/12/2015  . Statin intolerance 10/11/2014  . Near syncope 09/18/2014  . Headache disorder 09/18/2014  . Laceration of right lower leg with complication [id number]  . Post-traumatic wound infection Sep 07, 2013  . Loss or death of family member 20-Nov-2012  . Rash and nonspecific skin eruption 2012/11/20  . Pain of right breast 08/15/2012  . Low blood potassium 08/15/2012  . Urinary frequency 08/15/2012  . HX: breast cancer 08/15/2012  . TOBACCO USE 04/21/2012  . Drug side effects 10/24/2011  . Arthritis 05/31/2011  . Pain of left heel 05/31/2011  . Hypertension 05/31/2011  . Hyperglycemia 05/31/2011  . Infected cyst of skin 12/29/2010  . Leg cramps 12/12/2010  . Glossitis 11/26/2010  . Thrombocytopenia ([place]) 07/21/2010  . Traumatic ecchymosis of lower leg 07/21/2010  . Adverse drug effect 07/21/2010  . Atelectasis 07/16/2010  . Tinnitus 06/04/2010  . NEPHROLITHIASIS, HX OF 02/27/2010  . HIP PAIN 02/26/2010  . FEMORAL BRUIT, RIGHT 01/02/2010  . UNSTABLE ANGINA 12/12/2009  . ESSENTIAL HYPERTENSION, BENIGN 06/03/2009  . LIVER FUNCTION TESTS, ABNORMAL 01/22/2009  . NEOPLASM, SKIN, UNCERTAIN BEHAVIOR 99991111  . OSTEOARTHRITIS,  HAND 12/16/2008  . UNSPECIFIED DISORDER OF KIDNEY AND URETER 09/17/2008  . RENAL ARTERY STENOSIS 08/01/2008  . THROMBOCYTOPENIA 07/30/2008  . PERSONAL HISTORY OF MALIGNANT NEOPLASM OF BREAST 07/24/2008  . WEIGHT LOSS 06/11/2008  . GROIN PAIN 06/11/2008  . ACCIDENTAL FALL, HX  OF 06/11/2008  . INSOMNIA 04/05/2008  . UNSPECIFIED ANEMIA 03/18/2008  . HYPERLIPIDEMIA 03/10/2008  . UNDERWEIGHT 01/17/2008  . DERMATITIS 09/25/2007  . UNSPECIFIED VITAMIN D DEFICIENCY 02/07/2007  . HYPOTHYROIDISM 01/30/2007  . CHRONIC RHINITIS 12/13/2006  . COPD (chronic obstructive pulmonary disease) ([place]) 11/21/2006  . SPINAL STENOSIS 11/01/2006  . CAROTID ARTERY DISEASE 10/03/2006  . DEGENERATIVE JOINT DISEASE 09/27/2006  . LOSS, HEARING NOS 08/25/2006  . TMJ PAIN 08/25/2006  . CARCINOMA, BASAL CELL 02/13/2006  . HYPERTENSION 02/13/2006  . Coronary atherosclerosis 02/13/2006  . Peripheral vascular disease ([place]) 02/13/2006  . HEMATURIA, MICROSCOPIC, HX OF 02/13/2006    Past Surgical History:  Procedure Laterality Date  . ABDOMINAL HYSTERECTOMY    . BREAST LUMPECTOMY    . BREAST LUMPECTOMY  1998  . CATARACT EXTRACTION    . CORONARY ANGIOPLASTY WITH STENT PLACEMENT     bilat iliac stents, left renal artery and rt coronary artery last myoview 8/09 with EF 70%  . LUMBAR LAMINECTOMY    . SKIN CANCER EXCISION    . TONSILLECTOMY      OB History    No data available       Home Medications    Prior to Admission medications   Medication Sig Start Date End Date Taking? Authorizing Provider  amLODipine (NORVASC) 10 MG tablet Take 1 tablet (10 mg total) by mouth daily. 11/10/15  Yes Lelon Perla, MD  aspirin 81 MG chewable tablet Chew 81 mg by mouth daily.   Yes Historical Provider, MD  carboxymethylcellulose (REFRESH PLUS) 0.5 % SOLN Place 1 drop into both eyes 4 (four) times daily.   Yes Historical Provider, MD  Cholecalciferol (VITAMIN D) 1000 UNITS capsule Take 1,000 Units by mouth daily.     Yes Historical Provider, MD  fish oil-omega-3 fatty acids 1000 MG capsule Take 1 g by mouth daily.    Yes Historical Provider, MD  isosorbide mononitrate (IMDUR) 30 MG 24 hr tablet Take 1 tablet (30 mg total) by mouth daily. 10/31/15  Yes Lelon Perla, MD  levalbuterol  (XOPENEX HFA) 45 MCG/ACT inhaler INHALE 2 PUFFS BY MOUTH EVERY 6 HOURS AS NEEDED FOR WHEEZING 10/15/15  Yes Rigoberto Noel, MD  metoprolol succinate (TOPROL-XL) 25 MG 24 hr tablet Take 1 tablet (25 mg total) by mouth daily. 10/31/15  Yes Lelon Perla, MD  nitroGLYCERIN (NITROSTAT) 0.4 MG SL tablet Place 1 tablet (0.4 mg total) under the tongue every 5 (five) minutes as needed for chest pain. 11/03/15 02/01/16 Yes Sherren Mocha, MD  tiotropium (SPIRIVA HANDIHALER) 18 MCG inhalation capsule INHALE CONTENTS OF 1 CAPSULE ONCE DAILY USING HANDIHALER 11/11/15  Yes Rigoberto Noel, MD  traMADol (ULTRAM) 50 MG tablet TAKE ONE TABLET BY MOUTH EVERY EIGHT HOURS AS NEEDED FOR PAIN 06/28/12  Yes Burnis Medin, MD  aspirin (BAYER ASPIRIN) 325 MG tablet Take 1 tablet (325 mg total) by mouth daily. Patient not taking: Reported on 11/12/2015 09/30/14   Kathrynn Ducking, MD  azithromycin (ZITHROMAX) 500 MG tablet Take 1 tablet (500 mg total) by mouth daily. TAKE 500 MG ONE DAY THEN 250 MG FOR 4 DAYS Patient not taking: Reported on 11/12/2015 08/21/15   Lelon Perla, MD  Family History Family History  Problem Relation Age of Onset  . Breast cancer Daughter   . Tuberculosis Father     Social History Social History  Substance Use Topics  . Smoking status: Former Smoker    Packs/day: 1.00    Years: 30.00    Types: Cigarettes    Quit date: 06/15/2010  . Smokeless tobacco: Never Used     Comment: 2-3 cigs per week/// 06/30/12  . Alcohol use No     Comment: socially     Allergies   Statins; Oxycodone; Albuterol; Hydrocodone-acetaminophen; and Tequin   Review of Systems Review of Systems  Constitutional: Negative for chills, fatigue and fever.  HENT: Positive for congestion. Negative for rhinorrhea.   Eyes: Negative for visual disturbance.  Respiratory: Positive for cough, shortness of breath and wheezing.   Cardiovascular: Negative for chest pain.  Gastrointestinal: Negative for abdominal pain,  diarrhea, nausea and vomiting.  Genitourinary: Negative for dysuria and flank pain.  Musculoskeletal: Negative for neck pain and neck stiffness.  Skin: Negative for rash and wound.  Allergic/Immunologic: Negative for immunocompromised state.  Neurological: Negative for syncope, weakness and headaches.  All other systems reviewed and are negative.    Physical Exam Updated Vital Signs BP 127/57   Pulse 71   Temp 98.5 F (36.9 C) (Oral)   Resp 16   Ht 5\' 4"  (1.626 m)   Wt 90 lb (40.8 kg)   SpO2 95%   BMI 15.45 kg/m   Physical Exam  Constitutional: She is oriented to person, place, and time. She appears well-developed and well-nourished. She appears distressed.  HENT:  Head: Normocephalic and atraumatic.  Eyes: Conjunctivae are normal.  Neck: Neck supple.  Cardiovascular: Normal rate, regular rhythm and normal heart sounds.  Exam reveals no friction rub.   No murmur heard. Pulmonary/Chest: Accessory muscle usage present. Tachypnea noted. She is in respiratory distress. She has decreased breath sounds. She has wheezes in the right upper field, the right middle field, the right lower field, the left upper field, the left middle field and the left lower field. She has no rales.  Abdominal: She exhibits no distension.  Musculoskeletal: She exhibits no edema.  Neurological: She is alert and oriented to person, place, and time. She exhibits normal muscle tone.  Skin: Skin is warm. Capillary refill takes less than 2 seconds.  Psychiatric: She has a normal mood and affect.  Nursing note and vitals reviewed.    ED Treatments / Results  Labs (all labs ordered are listed, but only abnormal results are displayed) Labs Reviewed  COMPREHENSIVE METABOLIC PANEL - Abnormal; Notable for the following:       Result Value   Glucose, Bld 109 (*)    Total Protein 6.2 (*)    Albumin 3.1 (*)    GFR calc non Af Amer 53 (*)    All other components within normal limits  BRAIN NATRIURETIC PEPTIDE  - Abnormal; Notable for the following:    B Natriuretic Peptide 251.9 (*)    All other components within normal limits  I-STAT VENOUS BLOOD GAS, ED - Abnormal; Notable for the following:    pO2, Ven 26.0 (*)    All other components within normal limits  CBC WITH DIFFERENTIAL/PLATELET  INFLUENZA PANEL BY PCR (TYPE A & B, H1N1)  I-STAT CG4 LACTIC ACID, ED  Randolm Idol, ED  I-STAT CG4 LACTIC ACID, ED  Randolm Idol, ED    EKG  EKG Interpretation  Date/Time:  Wednesday November 12 2015  11:58:22 EDT Ventricular Rate:  79 PR Interval:    QRS Duration: 131 QT Interval:  394 QTC Calculation: 443 R Axis:   63 Text Interpretation:  Sinus rhythm Anterior infarct, old Confirmed by Jabaree Mercado MD, Joshuwa Vecchio [phone]) on 11/12/2015 4:17:49 PM       Radiology Dg Chest Portable 1 View  Result Date: 11/12/2015 CLINICAL DATA:  Shortness of breath. History of COPD, coronary artery disease with stent placement, former heavy smoker. EXAM: PORTABLE CHEST 1 VIEW COMPARISON:  PA and lateral chest x-ray of July 25, 2015 FINDINGS: The lungs remain hyperinflated. There is no pneumothorax pleural effusion, or alveolar infiltrate. Minimal density blunts the right lateral costophrenic angle but this is stable. The heart and pulmonary vascularity are normal. There is calcification within the wall of the aortic arch. The bony thorax exhibits no acute abnormality. There is chronic dextrocurvature of the thoracolumbar spine. There are surgical clips in the right axillary region. IMPRESSION: COPD.  No pneumonia nor CHF. Aortic atherosclerosis. Electronically Signed   By: David  Martinique M.D.   On: 11/12/2015 12:25    Procedures .Critical Care Performed by: Duffy Bruce Authorized by: Duffy Bruce   Critical care provider statement:    Critical care time (minutes):  35   Critical care time was exclusive of:  Separately billable procedures and treating other patients   Critical care was necessary to treat  or prevent imminent or life-threatening deterioration of the following conditions:  Respiratory failure   Critical care was time spent personally by me on the following activities:  Development of treatment plan with patient or surrogate, discussions with primary provider, evaluation of patient's response to treatment, examination of patient, obtaining history from patient or surrogate, ordering and performing treatments and interventions, ordering and review of radiographic studies, ordering and review of laboratory studies, pulse oximetry, re-evaluation of patient's condition and review of old charts   I assumed direction of critical care for this patient from another provider in my specialty: no     (including critical care time)  Medications Ordered in ED Medications  doxycycline (VIBRAMYCIN) 100 mg in dextrose 5 % 250 mL IVPB (100 mg Intravenous Transfusing/Transfer 11/12/15 1619)  levalbuterol (XOPENEX) nebulizer solution 1.25 mg (1.25 mg Nebulization Given 11/12/15 1221)  ipratropium (ATROVENT) nebulizer solution 1 mg (1 mg Nebulization Given 11/12/15 1218)  sodium chloride 0.9 % bolus 1,000 mL (1,000 mLs Intravenous Transfusing/Transfer 11/12/15 1619)  levalbuterol (XOPENEX) nebulizer solution 1.25 mg (1.25 mg Nebulization Given 11/12/15 1435)     Initial Impression / Assessment and Plan / ED Course  I have reviewed the triage vital signs and the nursing notes.  Pertinent labs & imaging results that were available during my care of the patient were reviewed by me and considered in my medical decision making (see chart for details).  Clinical Course  82 year old female with past medical history of COPD who presents with acute onset of shortness of breath in the setting of several days of sputum production. On arrival, patient has severe work of breathing with diffuse wheezing. Suspect primary COPD exacerbation. Patient has new onset hypoxic respiratory distress as well. Chest x-ray is  clear without evidence of pneumonia. Labwork is overall unremarkable. EKG is nonischemic and troponin is negative. Will continue breathing treatments and reassess.  Patient remains hypoxic despite breathing treatments 2. Will admit for continued management of acute hypoxic respiratory failure due to COPD.  Final Clinical Impressions(s) / ED Diagnoses   Final diagnoses:  COPD exacerbation ([place])  Acute respiratory failure  with hypoxia [facility])    New Prescriptions New Prescriptions   No medications on file     Duffy Bruce, MD 11/12/15 1619    Duffy Bruce, MD 11/12/15 1623

## 2015-11-12 NOTE — ED Notes (Signed)
Attempted report 

## 2015-11-12 NOTE — H&P (Signed)
History and Physical    [name] [name] [id number] DOB: 10-25-1927 DOA: 11/12/2015  PCP: Lottie Dawson, MD  Patient coming from: home  Chief Complaint: shortness of breath  HPI: [name] [name] is a 82 y.o. female with medical history significant of COPD, tobacco abuse, essential HTN, CAD, skin cancer LLE (2 weeks ago with skin graft), who had sudden onset of shortness of breath this morning. She states that her mobility has been limited after her skin cancer biopsy and skin graft procedure 2 weeks ago. She admits to nonproductive cough, but no fevers or chills. She denies any chest pain, no peripheral edema, no nausea or vomiting, no abdominal pain. She states that she has had decreased appetite over the past 2 weeks after her procedure. She is currently smoking but has cut down to 3 cigarettes a day. She does not require any oxygen at home.  ED Course: Patient given Xopenex, ipratropium, started on doxycycline.  Review of Systems: As per HPI otherwise 10 point review of systems negative.   Past Medical History:  Diagnosis Date  . CARCINOMA, BASAL CELL 02/13/2006  . CAROTID ARTERY DISEASE 10/03/2006  . CHRONIC OBSTRUCTIVE PULMONARY DISEASE, ACUTE EXACERBATION 11/17/2009  . Chronic rhinitis 12/13/2006  . CORONARY ARTERY DISEASE 02/13/2006  . DEGENERATIVE JOINT DISEASE 09/27/2006  . DERMATITIS 09/25/2007  . Head trauma    closed  . HYPERLIPIDEMIA 03/10/2008  . HYPERTENSION 02/13/2006  . HYPOTHYROIDISM 01/30/2007  . LIVER FUNCTION TESTS, ABNORMAL 01/22/2009  . LOSS, HEARING NOS 08/25/2006  . Near syncope 09/18/2014  . NEOPLASM, SKIN, UNCERTAIN BEHAVIOR [id number]  . NEPHROLITHIASIS, HX OF 02/27/2010  . OSTEOARTHRITIS, HAND 12/16/2008  . PERIPHERAL VASCULAR DISEASE 02/13/2006  . Personal history of malignant neoplasm of breast 07/24/2008  . Personal history of urinary calculi   . Post-traumatic wound infection 12/01/2010   Mild streaking superiorly and on one day of  septra  Will give rocephin and add keflex  adn follow up   . POSTHERPETIC NEURALGIA 08/25/2006  . RENAL ARTERY STENOSIS 08/01/2008  . Shingles    recurrent  . SPINAL STENOSIS 11/01/2006  . THROMBOCYTOPENIA 07/30/2008  . UNS ADVRS EFF UNS RX MEDICINAL&BIOLOGICAL SBSTNC 01/30/2007  . UNSPECIFIED ANEMIA 03/18/2008  . Unspecified vitamin D deficiency 02/07/2007  . UNSTABLE ANGINA 12/12/2009  . Vitreous detachment     Past Surgical History:  Procedure Laterality Date  . ABDOMINAL HYSTERECTOMY    . BREAST LUMPECTOMY    . BREAST LUMPECTOMY  1998  . CATARACT EXTRACTION    . CORONARY ANGIOPLASTY WITH STENT PLACEMENT     bilat iliac stents, left renal artery and rt coronary artery last myoview 8/09 with EF 70%  . LUMBAR LAMINECTOMY    . SKIN CANCER EXCISION    . TONSILLECTOMY       reports that she quit smoking about 5 years ago. Her smoking use included Cigarettes. She has a 30.00 pack-year smoking history. She has never used smokeless tobacco. She reports that she does not drink alcohol or use drugs.  Allergies  Allergen Reactions  . Statins Other (See Comments)    Elevated liver enzymes  . Oxycodone Anxiety    Also states, "It makes me feel like a zombie"  . Albuterol     Shakes   . Hydrocodone-Acetaminophen Nausea Only    Dizziness  . Tequin Other (See Comments)    Patient doesn't recall    Family History  Problem Relation Age of Onset  . Breast cancer Daughter   . Tuberculosis Father  Prior to Admission medications   Medication Sig Start Date End Date Taking? Authorizing Provider  amLODipine (NORVASC) 10 MG tablet Take 1 tablet (10 mg total) by mouth daily. 11/10/15  Yes Lelon Perla, MD  aspirin 81 MG chewable tablet Chew 81 mg by mouth daily.   Yes Historical Provider, MD  carboxymethylcellulose (REFRESH PLUS) 0.5 % SOLN Place 1 drop into both eyes 4 (four) times daily.   Yes Historical Provider, MD  Cholecalciferol (VITAMIN D) 1000 UNITS capsule Take 1,000 Units by  mouth daily.     Yes Historical Provider, MD  fish oil-omega-3 fatty acids 1000 MG capsule Take 1 g by mouth daily.    Yes Historical Provider, MD  isosorbide mononitrate (IMDUR) 30 MG 24 hr tablet Take 1 tablet (30 mg total) by mouth daily. 10/31/15  Yes Lelon Perla, MD  levalbuterol (XOPENEX HFA) 45 MCG/ACT inhaler INHALE 2 PUFFS BY MOUTH EVERY 6 HOURS AS NEEDED FOR WHEEZING 10/15/15  Yes Rigoberto Noel, MD  metoprolol succinate (TOPROL-XL) 25 MG 24 hr tablet Take 1 tablet (25 mg total) by mouth daily. 10/31/15  Yes Lelon Perla, MD  nitroGLYCERIN (NITROSTAT) 0.4 MG SL tablet Place 1 tablet (0.4 mg total) under the tongue every 5 (five) minutes as needed for chest pain. 11/03/15 02/01/16 Yes Sherren Mocha, MD  tiotropium (SPIRIVA HANDIHALER) 18 MCG inhalation capsule INHALE CONTENTS OF 1 CAPSULE ONCE DAILY USING HANDIHALER 11/11/15  Yes Rigoberto Noel, MD  traMADol (ULTRAM) 50 MG tablet TAKE ONE TABLET BY MOUTH EVERY EIGHT HOURS AS NEEDED FOR PAIN 06/28/12  Yes Burnis Medin, MD  aspirin (BAYER ASPIRIN) 325 MG tablet Take 1 tablet (325 mg total) by mouth daily. Patient not taking: Reported on 11/12/2015 09/30/14   Kathrynn Ducking, MD  azithromycin (ZITHROMAX) 500 MG tablet Take 1 tablet (500 mg total) by mouth daily. TAKE 500 MG ONE DAY THEN 250 MG FOR 4 DAYS Patient not taking: Reported on 11/12/2015 08/21/15   Lelon Perla, MD    Physical Exam: Vitals:   11/12/15 1445 11/12/15 1530 11/12/15 1545 11/12/15 1615  BP: 127/57 133/55 (!) 128/54 130/63  Pulse: 71 80 71 (!) 56  Resp: 16 15 26 19   Temp:      TempSrc:      SpO2: 95% 94% 97% 93%  Weight:      Height:        Constitutional: NAD, calm, comfortable Eyes: PERRL, lids and conjunctivae normal ENMT: Mucous membranes are moist. Posterior pharynx clear of any exudate or lesions. Neck: normal, supple, no masses, no thyromegaly Respiratory: +bilateral rhonchi, no wheezing, no crackles. Normal respiratory effort. No accessory  muscle use. +Amado O2  Cardiovascular: Regular rate and rhythm, no murmurs / rubs / gallops. No extremity edema.  Abdomen: no tenderness, no masses palpated. No hepatosplenomegaly. Bowel sounds positive.  Musculoskeletal: no clubbing / cyanosis. No joint deformity upper and lower extremities. Good ROM, no contractures. Normal muscle tone.  Skin: +left leg bandaged after skin cancer removal procedure, no edema, no drainage  Neurologic: CN 2-12 grossly intact. Sensation intact, DTR normal. Strength 5/5 in all 4.  Psychiatric: Normal judgment and insight. Alert and oriented x 3. Normal mood.   Labs on Admission: I have personally reviewed following labs and imaging studies  CBC:  Recent Labs Lab 11/12/15 1205  WBC 8.4  NEUTROABS 6.4  HGB 13.2  HCT 40.9  MCV 92.1  PLT 0000000   Basic Metabolic Panel:  Recent Labs Lab 11/12/15  1205  NA 142  K 4.3  CL 108  CO2 26  GLUCOSE 109*  BUN 20  CREATININE 0.95  CALCIUM 9.4   GFR: Estimated Creatinine Clearance: 27.9 mL/min (by C-G formula based on SCr of 0.95 mg/dL). Liver Function Tests:  Recent Labs Lab 11/12/15 1205  AST 28  ALT 19  ALKPHOS 94  BILITOT 0.5  PROT 6.2*  ALBUMIN 3.1*   No results for input(s): LIPASE, AMYLASE in the last 168 hours. No results for input(s): AMMONIA in the last 168 hours. Coagulation Profile: No results for input(s): INR, PROTIME in the last 168 hours. Cardiac Enzymes: No results for input(s): CKTOTAL, CKMB, CKMBINDEX, TROPONINI in the last 168 hours. BNP (last 3 results) No results for input(s): PROBNP in the last 8760 hours. HbA1C: No results for input(s): HGBA1C in the last 72 hours. CBG: No results for input(s): GLUCAP in the last 168 hours. Lipid Profile: No results for input(s): CHOL, HDL, LDLCALC, TRIG, CHOLHDL, LDLDIRECT in the last 72 hours. Thyroid Function Tests: No results for input(s): TSH, T4TOTAL, FREET4, T3FREE, THYROIDAB in the last 72 hours. Anemia Panel: No results for  input(s): VITAMINB12, FOLATE, FERRITIN, TIBC, IRON, RETICCTPCT in the last 72 hours. Urine analysis:    Component Value Date/Time   COLORURINE YELLOW 09/21/2010 0236   APPEARANCEUR CLEAR 09/21/2010 0236   LABSPEC 1.016 09/21/2010 0236   PHURINE 5.5 09/21/2010 0236   GLUCOSEU NEGATIVE 09/21/2010 0236   HGBUR MODERATE (A) 09/21/2010 0236   HGBUR large 02/27/2010 0951   BILIRUBINUR 1+ 08/12/2014 0937   KETONESUR negative 08/15/2012 1103   KETONESUR NEGATIVE 09/21/2010 0236   PROTEINUR Trace 08/12/2014 0937   PROTEINUR NEGATIVE 09/21/2010 0236   UROBILINOGEN 0.2 08/12/2014 0937   UROBILINOGEN 0.2 09/21/2010 0236   NITRITE Negative 08/12/2014 0937   NITRITE NEGATIVE 09/21/2010 0236   LEUKOCYTESUR Negative 08/12/2014 0937   Sepsis Labs: !!!!!!!!!!!!!!!!!!!!!!!!!!!!!!!!!!!!!!!!!!!! @LABRCNTIP (procalcitonin:4,lacticidven:4) )No results found for this or any previous visit (from the past 240 hour(s)).   Radiological Exams on Admission: Dg Chest Portable 1 View  Result Date: 11/12/2015 CLINICAL DATA:  Shortness of breath. History of COPD, coronary artery disease with stent placement, former heavy smoker. EXAM: PORTABLE CHEST 1 VIEW COMPARISON:  PA and lateral chest x-ray of July 25, 2015 FINDINGS: The lungs remain hyperinflated. There is no pneumothorax pleural effusion, or alveolar infiltrate. Minimal density blunts the right lateral costophrenic angle but this is stable. The heart and pulmonary vascularity are normal. There is calcification within the wall of the aortic arch. The bony thorax exhibits no acute abnormality. There is chronic dextrocurvature of the thoracolumbar spine. There are surgical clips in the right axillary region. IMPRESSION: COPD.  No pneumonia nor CHF. Aortic atherosclerosis. Electronically Signed   By: David  Martinique M.D.   On: 11/12/2015 12:25   Chest x-ray was reviewed independently. Hyperinflated lungs. No infiltrate suggestive of pneumonia.  EKG: Independently  reviewed. Normal sinus rhythm, rate 71, with left anterior fascicular block. J-point elevation present in V3, otherwise no significant ST T wave changes.  Assessment/Plan Principal Problem:   COPD exacerbation (HCC) Active Problems:   Essential hypertension, benign   TOBACCO USE   Acute respiratory failure with hypoxia (HCC)   CAD (coronary artery disease), native coronary artery  COPD exacerbation -Solu-Medrol -Doxycycline -DuoNeb -Continue Spiriva -Check influenza    Acute hypoxemic respiratory failure -Secondary to COPD exacerbation, less likely PE or DVT (well's critera 3%, low pre-test probability)  -Continue nasal cannula oxygen to maintain sat greater than 92%  Essential hypertension -Continue Norvasc, Toprol   CAD -Continue aspirin, Imdur, Toprol  Tobacco abuse -Cessation counseling   DVT prophylaxis: subq hep Code Status: FULL  Family Communication: close friends at bedside  Disposition Plan: Observation, pending improvement of COPD exacerbation. Consults called: None  Admission status: Observation    Dessa Phi, DO [facility] Pager [phone]  If 7PM-7AM, please contact night-coverage [email or web address] Password [email or web address] 11/12/2015, 5:20 PM

## 2015-11-12 NOTE — Progress Notes (Signed)
Patient admitted from E.D.. With oxygen at 3 lpm/[place].Awake alert and oriented x 4.Skin issues -has a dressing bandage wrap around on her left lower leg ,as per patient,Cancer was surgically excised and they placed a skin graft on that excised area.The source of skin graft is from the left hip and is dressed also.Both sites dressing are clean dry and intact.

## 2015-11-12 NOTE — ED Notes (Signed)
EKG given to Dr. Isaacs 

## 2015-11-12 NOTE — ED Notes (Signed)
Pt stayed between 90-93 O2 while ambulating on room air.

## 2015-11-12 NOTE — ED Triage Notes (Signed)
Pt reports SOB that began aprox. 2 hrs ago.  Pt used home inhaler with no relief.  Pt given 125mg  Solu-medrol, 2mg  Mag and 5mg  Albuterol PTA.  Pt reports allergy to Albuterol.  Pt sat 88% on NRB per EMS.

## 2015-11-13 DIAGNOSIS — Z955 Presence of coronary angioplasty implant and graft: Secondary | ICD-10-CM | POA: Diagnosis not present

## 2015-11-13 DIAGNOSIS — Z85828 Personal history of other malignant neoplasm of skin: Secondary | ICD-10-CM | POA: Diagnosis not present

## 2015-11-13 DIAGNOSIS — I10 Essential (primary) hypertension: Secondary | ICD-10-CM | POA: Diagnosis not present

## 2015-11-13 DIAGNOSIS — Z7982 Long term (current) use of aspirin: Secondary | ICD-10-CM | POA: Diagnosis not present

## 2015-11-13 DIAGNOSIS — Z681 Body mass index (BMI) 19 or less, adult: Secondary | ICD-10-CM | POA: Diagnosis not present

## 2015-11-13 DIAGNOSIS — I110 Hypertensive heart disease with heart failure: Secondary | ICD-10-CM | POA: Diagnosis not present

## 2015-11-13 DIAGNOSIS — K224 Dyskinesia of esophagus: Secondary | ICD-10-CM | POA: Diagnosis not present

## 2015-11-13 DIAGNOSIS — E559 Vitamin D deficiency, unspecified: Secondary | ICD-10-CM | POA: Diagnosis present

## 2015-11-13 DIAGNOSIS — Z23 Encounter for immunization: Secondary | ICD-10-CM | POA: Diagnosis not present

## 2015-11-13 DIAGNOSIS — J9811 Atelectasis: Secondary | ICD-10-CM | POA: Diagnosis not present

## 2015-11-13 DIAGNOSIS — E43 Unspecified severe protein-calorie malnutrition: Secondary | ICD-10-CM | POA: Diagnosis not present

## 2015-11-13 DIAGNOSIS — Z803 Family history of malignant neoplasm of breast: Secondary | ICD-10-CM | POA: Diagnosis not present

## 2015-11-13 DIAGNOSIS — I5031 Acute diastolic (congestive) heart failure: Secondary | ICD-10-CM | POA: Diagnosis not present

## 2015-11-13 DIAGNOSIS — E785 Hyperlipidemia, unspecified: Secondary | ICD-10-CM | POA: Diagnosis present

## 2015-11-13 DIAGNOSIS — Z853 Personal history of malignant neoplasm of breast: Secondary | ICD-10-CM | POA: Diagnosis not present

## 2015-11-13 DIAGNOSIS — J441 Chronic obstructive pulmonary disease with (acute) exacerbation: Secondary | ICD-10-CM | POA: Diagnosis not present

## 2015-11-13 DIAGNOSIS — J90 Pleural effusion, not elsewhere classified: Secondary | ICD-10-CM | POA: Diagnosis not present

## 2015-11-13 DIAGNOSIS — E876 Hypokalemia: Secondary | ICD-10-CM | POA: Diagnosis not present

## 2015-11-13 DIAGNOSIS — J449 Chronic obstructive pulmonary disease, unspecified: Secondary | ICD-10-CM | POA: Diagnosis not present

## 2015-11-13 DIAGNOSIS — J9601 Acute respiratory failure with hypoxia: Secondary | ICD-10-CM | POA: Diagnosis not present

## 2015-11-13 DIAGNOSIS — R0600 Dyspnea, unspecified: Secondary | ICD-10-CM | POA: Diagnosis not present

## 2015-11-13 DIAGNOSIS — F17210 Nicotine dependence, cigarettes, uncomplicated: Secondary | ICD-10-CM | POA: Diagnosis present

## 2015-11-13 DIAGNOSIS — J690 Pneumonitis due to inhalation of food and vomit: Secondary | ICD-10-CM | POA: Diagnosis not present

## 2015-11-13 DIAGNOSIS — Z87442 Personal history of urinary calculi: Secondary | ICD-10-CM | POA: Diagnosis not present

## 2015-11-13 DIAGNOSIS — Z79899 Other long term (current) drug therapy: Secondary | ICD-10-CM | POA: Diagnosis not present

## 2015-11-13 DIAGNOSIS — R69 Illness, unspecified: Secondary | ICD-10-CM | POA: Diagnosis not present

## 2015-11-13 LAB — CBC
HCT: 35.6 % — ABNORMAL LOW (ref 36.0–46.0)
Hemoglobin: 11.2 g/dL — ABNORMAL LOW (ref 12.0–15.0)
MCH: 29 pg (ref 26.0–34.0)
MCHC: 31.5 g/dL (ref 30.0–36.0)
MCV: 92.2 fL (ref 78.0–100.0)
PLATELETS: 147 10*3/uL — AB (ref 150–400)
RBC: 3.86 MIL/uL — ABNORMAL LOW (ref 3.87–5.11)
RDW: 14.8 % (ref 11.5–15.5)
WBC: 9.6 10*3/uL (ref 4.0–10.5)

## 2015-11-13 LAB — GLUCOSE, CAPILLARY
GLUCOSE-CAPILLARY: 154 mg/dL — AB (ref 65–99)
GLUCOSE-CAPILLARY: 171 mg/dL — AB (ref 65–99)

## 2015-11-13 LAB — BASIC METABOLIC PANEL
Anion gap: 8 (ref 5–15)
BUN: 19 mg/dL (ref 6–20)
CALCIUM: 7.9 mg/dL — AB (ref 8.9–10.3)
CHLORIDE: 112 mmol/L — AB (ref 101–111)
CO2: 21 mmol/L — AB (ref 22–32)
CREATININE: 1 mg/dL (ref 0.44–1.00)
GFR calc Af Amer: 58 mL/min — ABNORMAL LOW (ref >60)
GFR calc non Af Amer: 50 mL/min — ABNORMAL LOW (ref >60)
Glucose, Bld: 217 mg/dL — ABNORMAL HIGH (ref 65–99)
Potassium: 3.4 mmol/L — ABNORMAL LOW (ref 3.5–5.1)
SODIUM: 141 mmol/L (ref 135–145)

## 2015-11-13 MED ORDER — INSULIN ASPART 100 UNIT/ML ~~LOC~~ SOLN
0.0000 [IU] | Freq: Three times a day (TID) | SUBCUTANEOUS | Status: DC
  Administered 2015-11-13: 2 [IU] via SUBCUTANEOUS
  Administered 2015-11-14: 1 [IU] via SUBCUTANEOUS
  Administered 2015-11-15: 2 [IU] via SUBCUTANEOUS
  Administered 2015-11-16: 1 [IU] via SUBCUTANEOUS
  Administered 2015-11-16: 3 [IU] via SUBCUTANEOUS
  Administered 2015-11-17: 2 [IU] via SUBCUTANEOUS
  Administered 2015-11-17: 1 [IU] via SUBCUTANEOUS

## 2015-11-13 MED ORDER — GUAIFENESIN-DM 100-10 MG/5ML PO SYRP
5.0000 mL | ORAL_SOLUTION | ORAL | Status: DC | PRN
  Administered 2015-11-13: 5 mL via ORAL
  Filled 2015-11-13: qty 5

## 2015-11-13 MED ORDER — LEVALBUTEROL HCL 1.25 MG/0.5ML IN NEBU
1.2500 mg | INHALATION_SOLUTION | RESPIRATORY_TRACT | Status: DC | PRN
  Administered 2015-11-13: 1.25 mg via RESPIRATORY_TRACT

## 2015-11-13 MED ORDER — LEVALBUTEROL HCL 1.25 MG/0.5ML IN NEBU
INHALATION_SOLUTION | RESPIRATORY_TRACT | Status: AC
Start: 2015-11-13 — End: 2015-11-13
  Administered 2015-11-13: 1.25 mg via RESPIRATORY_TRACT
  Filled 2015-11-13: qty 0.5

## 2015-11-13 MED ORDER — IPRATROPIUM BROMIDE 0.02 % IN SOLN
0.5000 mg | RESPIRATORY_TRACT | Status: DC | PRN
  Administered 2015-11-13 (×2): 0.5 mg via RESPIRATORY_TRACT
  Filled 2015-11-13 (×2): qty 2.5

## 2015-11-13 MED ORDER — POTASSIUM CHLORIDE CRYS ER 20 MEQ PO TBCR
20.0000 meq | EXTENDED_RELEASE_TABLET | Freq: Once | ORAL | Status: AC
  Administered 2015-11-13: 20 meq via ORAL
  Filled 2015-11-13: qty 1

## 2015-11-13 MED ORDER — LEVALBUTEROL HCL 1.25 MG/0.5ML IN NEBU
1.2500 mg | INHALATION_SOLUTION | Freq: Four times a day (QID) | RESPIRATORY_TRACT | Status: DC
  Administered 2015-11-13 – 2015-11-14 (×3): 1.25 mg via RESPIRATORY_TRACT
  Filled 2015-11-13 (×4): qty 0.5

## 2015-11-13 MED ORDER — IPRATROPIUM BROMIDE 0.02 % IN SOLN
RESPIRATORY_TRACT | Status: AC
  Administered 2015-11-13: 0.5 mg via RESPIRATORY_TRACT
  Filled 2015-11-13: qty 2.5

## 2015-11-13 NOTE — Evaluation (Signed)
Physical Therapy Evaluation Patient Details Name: [name] [name] MRN: [id number] DOB: 1927-12-25 Today's Date: 11/13/2015   History of Present Illness  82 y.o. female admitted with respiratory failure. PMH of COPD, CAD, PVD, spinal stenosis, back surgery, breast cancer, recent skin graft LLE (per chart 2 weeks ago, per pt 3 days ago).   Clinical Impression  Pt admitted with above diagnosis. Pt currently with functional limitations due to the deficits listed below (see PT Problem List). Pt ambulated 200' with RW without loss of balance, SaO2 93-95% on RA. She has some deconditioning related to recent LLE skin graft.  Pt will benefit from skilled PT to increase their independence and safety with mobility to allow discharge to the venue listed below.       Follow Up Recommendations Home health PT    Equipment Recommendations  None recommended by PT    Recommendations for Other Services       Precautions / Restrictions Precautions Precautions: Fall Precaution Comments: 1 fall in past year, fell off deck while staining it (? due to fumes) Restrictions Weight Bearing Restrictions: No      Mobility  Bed Mobility Overal bed mobility: Needs Assistance Bed Mobility: Supine to Sit     Supine to sit: Modified independent (Device/Increase time);HOB elevated        Transfers Overall transfer level: Needs assistance Equipment used: Rolling walker (2 wheeled) Transfers: Sit to/from Stand Sit to Stand: Min guard         General transfer comment: good hand placement  Ambulation/Gait Ambulation/Gait assistance: Min guard Ambulation Distance (Feet): 200 Feet Assistive device: Rolling walker (2 wheeled) Gait Pattern/deviations: Step-through pattern;Decreased stride length   Gait velocity interpretation: at or above normal speed for age/gender General Gait Details: steady, no LOB, SaO2 93-95% on RA walking, 2/4 dyspnea, pt reports her legs "feel weak" when walking, this is  chronic to to "back problems" but is worse than her baseline  Stairs            Wheelchair Mobility    Modified Rankin (Stroke Patients Only)       Balance Overall balance assessment: Modified Independent                                           Pertinent Vitals/Pain Pain Assessment: No/denies pain    Home Living Family/patient expects to be discharged to:: Private residence Living Arrangements: Alone Available Help at Discharge: Neighbor;Available PRN/intermittently Type of Home: House Home Access: Stairs to enter Entrance Stairs-Rails: None Entrance Stairs-Number of Steps: 1 Home Layout: One level Home Equipment: Walker - 2 wheels;Toilet riser;Shower seat      Prior Function Level of Independence: Independent with assistive device(s)         Comments: walks with RW, drives     Hand Dominance        Extremity/Trunk Assessment   Upper Extremity Assessment: Generalized weakness           Lower Extremity Assessment: Generalized weakness      Cervical / Trunk Assessment: Kyphotic  Communication   Communication: No difficulties  Cognition Arousal/Alertness: Awake/alert Behavior During Therapy: WFL for tasks assessed/performed Overall Cognitive Status: Within Functional Limits for tasks assessed                      General Comments      Exercises  Assessment/Plan    PT Assessment Patient needs continued PT services  PT Problem List Decreased strength;Decreased activity tolerance;Decreased mobility          PT Treatment Interventions Gait training;Functional mobility training;Therapeutic exercise;Therapeutic activities;Patient/family education    PT Goals (Current goals can be found in the Care Plan section)  Acute Rehab PT Goals Patient Stated Goal: to get stronger PT Goal Formulation: With patient Time For Goal Achievement: 11/27/15 Potential to Achieve Goals: Good    Frequency Min 3X/week    Barriers to discharge        Co-evaluation               End of Session Equipment Utilized During Treatment: Gait belt Activity Tolerance: Patient tolerated treatment well Patient left: in chair;with call bell/phone within reach;with family/visitor present Nurse Communication: Mobility status    Functional Assessment Tool Used: clinical judgement Functional Limitation: Mobility: Walking and moving around Mobility: Walking and Moving Around Current Status [id number]): At least 20 percent but less than 40 percent impaired, limited or restricted Mobility: Walking and Moving Around Goal Status [phone]): At least 1 percent but less than 20 percent impaired, limited or restricted    Time: 1346-1414 PT Time Calculation (min) (ACUTE ONLY): 28 min   Charges:   PT Evaluation $PT Eval Low Complexity: 1 Procedure PT Treatments $Gait Training: 8-22 mins   PT G Codes:   PT G-Codes **NOT FOR INPATIENT CLASS** Functional Assessment Tool Used: clinical judgement Functional Limitation: Mobility: Walking and moving around Mobility: Walking and Moving Around Current Status [id number]): At least 20 percent but less than 40 percent impaired, limited or restricted Mobility: Walking and Moving Around Goal Status [phone]): At least 1 percent but less than 20 percent impaired, limited or restricted    Philomena Doheny 11/13/2015, 2:28 PM [phone]

## 2015-11-13 NOTE — Progress Notes (Signed)
RT called for PRN.  Pt PRN schedule not met at this time because it is too soon to last dose at 1458.  RN made aware of this and told RT that he will administer Guaifenesin and recall RT if needed.

## 2015-11-13 NOTE — Progress Notes (Addendum)
Patient ID: [name] [name], female   DOB: 12/07/1927, 82 y.o.   MRN: [id number]    PROGRESS NOTE    [name] [name]  [id number] DOB: February 04, 1927 DOA: 11/12/2015  PCP: Lottie Dawson, MD   Brief Narrative:  82 y.o. female with medical history significant of COPD, tobacco abuse, essential HTN, CAD, skin cancer LLE (2 weeks ago with skin graft), who had sudden onset of shortness of breath this morning. She states that her mobility has been limited after her skin cancer biopsy and skin graft procedure 2 weeks ago. She admits to nonproductive cough, but no fevers or chills. She denies any chest pain, no peripheral edema, no nausea or vomiting, no abdominal pain.   Assessment & Plan:   Principal Problem:   Acute hypoxic respiratory failure secondary to acute COPD exacerbation ([name]) - pt says she feels better but her breath sounds are still course with exp wheezing and rhonchi  - will continue solumedrol IV for now 60 mg IV Q6 hours - suspect we can plan on tapering down solumedrol in next 24 hours - will also continue duonebs scheduled and as needed, currently on doxy IV as pt with dysphagia  - continue Spiriva - still unable to taper off oxygen as pt quickly desaturates down to 80''s off [place]   Active Problems:   Essential hypertension, benign - SBP in 150's this AM - continue Norvasc, Metoprolol, Imdur     TOBACCO USE - consultation provided - OK to use Nicotine patch if needed     Hypokalemia - mild, supplement, BMP in AM    Dysphagia - esophagogram ordered   DVT prophylaxis: Heparin SQ Code Status: Full  Family Communication: Patient at bedside, daughter who lived in [place] updated over the phone  Disposition Plan: Home in 2-3 days   Consultants:   None  Procedures:   None  Antimicrobials:   Doxycycline 10/11 -->    Subjective: Stil with exertional dyspnea.   Objective: Vitals:   11/12/15 2006 11/13/15 0458 11/13/15 0817 11/13/15 1042  BP:  (!) 142/54 (!) 143/51 (!) 150/65 (!) 154/54  Pulse: 68 72 75 69  Resp: 18 19 17    Temp: 97.5 F (36.4 C) 97.8 F (36.6 C) 98.2 F (36.8 C)   TempSrc: Oral Oral Oral   SpO2: 95% 96% 96%   Weight: 40.5 kg (89 lb 4.6 oz)     Height:        Intake/Output Summary (Last 24 hours) at 11/13/15 1308 Last data filed at 11/13/15 0946  Gross per 24 hour  Intake              370 ml  Output              700 ml  Net             -330 ml   Filed Weights   11/12/15 1149 11/12/15 2006  Weight: 40.8 kg (90 lb) 40.5 kg (89 lb 4.6 oz)    Examination:  General exam: Appears calm and comfortable  Respiratory system: Course breath sounds at bases with exp wheezing and rhonchi at bases  Cardiovascular system: RRR. No JVD, murmurs, rubs, gallops or clicks. No pedal edema. Gastrointestinal system: Abdomen is nondistended, soft and nontender. No organomegaly or masses felt.  Central nervous system: Alert and oriented. No focal neurological deficits. Extremities: Symmetric 5 x 5 power. Skin: No rashes, lesions or ulcers Psychiatry: Judgement and insight appear normal. Mood & affect appropriate.   Data Reviewed:  I have personally reviewed following labs and imaging studies  CBC:  Recent Labs Lab 11/12/15 1205 11/13/15 0410  WBC 8.4 9.6  NEUTROABS 6.4  --   HGB 13.2 11.2*  HCT 40.9 35.6*  MCV 92.1 92.2  PLT 160 [id number]*   Basic Metabolic Panel:  Recent Labs Lab 11/12/15 1205 11/13/15 0410  NA 142 141  K 4.3 3.4*  CL 108 112*  CO2 26 21*  GLUCOSE 109* 217*  BUN 20 19  CREATININE 0.95 1.00  CALCIUM 9.4 7.9*   Liver Function Tests:  Recent Labs Lab 11/12/15 1205  AST 28  ALT 19  ALKPHOS 94  BILITOT 0.5  PROT 6.2*  ALBUMIN 3.1*   Radiology Studies: Dg Chest Portable 1 View  Result Date: 11/12/2015 CLINICAL DATA:  Shortness of breath. History of COPD, coronary artery disease with stent placement, former heavy smoker. EXAM: PORTABLE CHEST 1 VIEW COMPARISON:  PA and lateral  chest x-ray of July 25, 2015 FINDINGS: The lungs remain hyperinflated. There is no pneumothorax pleural effusion, or alveolar infiltrate. Minimal density blunts the right lateral costophrenic angle but this is stable. The heart and pulmonary vascularity are normal. There is calcification within the wall of the aortic arch. The bony thorax exhibits no acute abnormality. There is chronic dextrocurvature of the thoracolumbar spine. There are surgical clips in the right axillary region. IMPRESSION: COPD.  No pneumonia nor CHF. Aortic atherosclerosis. Electronically Signed   By: David  Martinique M.D.   On: 11/12/2015 12:25      Scheduled Meds: . amLODipine  10 mg Oral Daily  . aspirin  81 mg Oral Daily  . dextromethorphan-guaiFENesin  1 tablet Oral BID  . doxycycline (VIBRAMYCIN) IV  100 mg Intravenous Q12H  . heparin  5,000 Units Subcutaneous Q8H  . Influenza vac split quadrivalent PF  0.5 mL Intramuscular Tomorrow-1000  . isosorbide mononitrate  30 mg Oral Daily  . methylPREDNISolone (SOLU-MEDROL) injection  60 mg Intravenous Q6H  . metoprolol succinate  25 mg Oral Daily  . polyvinyl alcohol  1 drop Both Eyes QID  . tiotropium  18 mcg Inhalation Daily   Continuous Infusions:    LOS: 0 days   Time spent: 20 minutes   Faye Ramsay, MD [facility] Pager [phone]  If 7PM-7AM, please contact night-coverage [email or web address] Password [email or web address] 11/13/2015, 1:08 PM

## 2015-11-13 NOTE — Care Management Obs Status (Signed)
[place] NOTIFICATION   Patient Details  Name: TYSHONDA SIVERS MRN: [id number] Date of Birth: 03/25/1927   Medicare Observation Status Notification Given:  Yes    Zuri Lascala, Rory Percy, RN 11/13/2015, 2:32 PM

## 2015-11-13 NOTE — Progress Notes (Signed)
Inpatient Diabetes Program Recommendations  AACE/ADA: New Consensus Statement on Inpatient Glycemic Control (2015)  Target Ranges:  Prepandial:   less than 140 mg/dL      Peak postprandial:   less than 180 mg/dL (1-2 hours)      Critically ill patients:  140 - 180 mg/dL   Lab Results  Component Value Date   GLUCAP 173 (H) 09/21/2010   HGBA1C 6.0 08/15/2012    Review of Glycemic Control- lab glucose  Results for [name] [name], [name] [name] (MRN [id number]) as of 11/13/2015 12:01  Ref. Range 11/12/2015 12:14 11/12/2015 14:35 11/12/2015 14:37 11/12/2015 16:33 11/13/2015 04:10  Glucose Latest Ref Range: 65 - 99 mg/dL     217 (H)    Diabetes history: none noted Outpatient Diabetes medications: none Current orders for Inpatient glycemic control: none * patient on steroids  Inpatient Diabetes Program Recommendations: Please consider checking CBG tid and hs- lab glucose elevated this am.   Per ADA recommendations "consider performing an A1C on all patients with diabetes or hyperglycemia admitted to the hospital if not performed in the prior 3 months".   Gentry Fitz, RN, BA, MHA, CDE Diabetes Coordinator Inpatient Diabetes Program  [phone] (Team Pager) [phone] ([place]) 11/13/2015 12:03 PM

## 2015-11-14 ENCOUNTER — Inpatient Hospital Stay (HOSPITAL_COMMUNITY): Payer: Medicare HMO

## 2015-11-14 LAB — BASIC METABOLIC PANEL
Anion gap: 7 (ref 5–15)
BUN: 23 mg/dL — AB (ref 6–20)
CHLORIDE: 110 mmol/L (ref 101–111)
CO2: 25 mmol/L (ref 22–32)
Calcium: 9.2 mg/dL (ref 8.9–10.3)
Creatinine, Ser: 1.08 mg/dL — ABNORMAL HIGH (ref 0.44–1.00)
GFR, EST AFRICAN AMERICAN: 53 mL/min — AB (ref >60)
GFR, EST NON AFRICAN AMERICAN: 45 mL/min — AB (ref >60)
Glucose, Bld: 170 mg/dL — ABNORMAL HIGH (ref 65–99)
POTASSIUM: 3.8 mmol/L (ref 3.5–5.1)
SODIUM: 142 mmol/L (ref 135–145)

## 2015-11-14 LAB — CBC
HCT: 39.4 % (ref 36.0–46.0)
HEMOGLOBIN: 12.3 g/dL (ref 12.0–15.0)
MCH: 28.6 pg (ref 26.0–34.0)
MCHC: 31.2 g/dL (ref 30.0–36.0)
MCV: 91.6 fL (ref 78.0–100.0)
PLATELETS: 173 10*3/uL (ref 150–400)
RBC: 4.3 MIL/uL (ref 3.87–5.11)
RDW: 15 % (ref 11.5–15.5)
WBC: 17.5 10*3/uL — AB (ref 4.0–10.5)

## 2015-11-14 LAB — GLUCOSE, CAPILLARY
GLUCOSE-CAPILLARY: 133 mg/dL — AB (ref 65–99)
GLUCOSE-CAPILLARY: 151 mg/dL — AB (ref 65–99)
Glucose-Capillary: 133 mg/dL — ABNORMAL HIGH (ref 65–99)
Glucose-Capillary: 142 mg/dL — ABNORMAL HIGH (ref 65–99)

## 2015-11-14 MED ORDER — FUROSEMIDE 10 MG/ML IJ SOLN
20.0000 mg | Freq: Once | INTRAMUSCULAR | Status: AC
  Administered 2015-11-14: 20 mg via INTRAVENOUS
  Filled 2015-11-14: qty 2

## 2015-11-14 MED ORDER — ENSURE ENLIVE PO LIQD
237.0000 mL | Freq: Two times a day (BID) | ORAL | Status: DC
  Administered 2015-11-14: 237 mL via ORAL

## 2015-11-14 MED ORDER — PREMIER PROTEIN SHAKE
11.0000 [oz_av] | Freq: Three times a day (TID) | ORAL | Status: DC
  Administered 2015-11-14 – 2015-11-17 (×10): 11 [oz_av] via ORAL
  Filled 2015-11-14 (×3): qty 325.31

## 2015-11-14 MED ORDER — LEVALBUTEROL HCL 1.25 MG/0.5ML IN NEBU
1.2500 mg | INHALATION_SOLUTION | RESPIRATORY_TRACT | Status: DC
  Administered 2015-11-14 – 2015-11-16 (×15): 1.25 mg via RESPIRATORY_TRACT
  Filled 2015-11-14 (×15): qty 0.5

## 2015-11-14 NOTE — Progress Notes (Addendum)
Patient ID: [name] [name], female   DOB: Jun 24, 1927, 82 y.o.   MRN: [id number]    PROGRESS NOTE    [name] [name]  [id number] DOB: 1927/09/13 DOA: 11/12/2015  PCP: Lottie Dawson, MD   Brief Narrative:  82 y.o. female with medical history significant of COPD, tobacco abuse, essential HTN, CAD, skin cancer LLE (2 weeks ago with skin graft), who had sudden onset of shortness of breath this morning. She states that her mobility has been limited after her skin cancer biopsy and skin graft procedure 2 weeks ago. She admits to nonproductive cough, but no fevers or chills. She denies any chest pain, no peripheral edema, no nausea or vomiting, no abdominal pain.   Assessment & Plan:   Principal Problem:   Acute hypoxic respiratory failure secondary to acute COPD exacerbation (HCC) - breath sounds are still course with exp wheezing and rhonchi  - will continue solumedrol IV for now 60 mg IV Q6 hours with no plan to taper down yet until breath sounds improve  - will also continue duonebs scheduled and as needed, currently on doxy IV and will continue same regimen day #3 - continue Spiriva - CT chest pending this AM for clearer evaluation - pending CT chest and clinical status, may need PCCM input  - add flutter valve   Active Problems:   Essential hypertension, benign - SBP in 130's this AM - continue Norvasc, Metoprolol, Imdur     TOBACCO USE - consultation provided - OK to use Nicotine patch if needed     Hypokalemia - supplemented and WNL this AM    Dysphagia - esophagogram technically difficult exam due to patient's frail physical condition and inability to perform rapid bolus swallowing.  - noted mild vestibular penetration, without evidence of aspiration, no evidence of esophageal mass, stricture, or obstruction - Extrinsic impression on posterior cervical esophagus due to prominent cervical spine osteophytosis. Mild esophageal dysmotility - OK to advance  diet as pt able to tolerate     Leukocytosis - suspect from steroids - CBC in AM     Underweight, severe PCM  - Body mass index is 15.21 kg/m.  DVT prophylaxis: Heparin SQ Code Status: Full  Family Communication: Patient at bedside, daughter who lives in [place] updated over the phone  Disposition Plan: Home in 2-3 days   Consultants:   None  Procedures:   None  Antimicrobials:   Doxycycline 10/11 -->   Subjective: Stil with exertional dyspnea.   Objective: Vitals:   11/14/15 0456 11/14/15 0941 11/14/15 0944 11/14/15 0948  BP: (!) 153/61   (!) 137/59  Pulse: 68   89  Resp: 19   19  Temp: 98.3 F (36.8 C)   97.9 F (36.6 C)  TempSrc: Oral   Oral  SpO2: 97% 98% 98% 96%  Weight:      Height:        Intake/Output Summary (Last 24 hours) at 11/14/15 1106 Last data filed at 11/14/15 0930  Gross per 24 hour  Intake              350 ml  Output              300 ml  Net               50 ml   Filed Weights   11/12/15 1149 11/12/15 2006 11/13/15 2044  Weight: 40.8 kg (90 lb) 40.5 kg (89 lb 4.6 oz) 40.2 kg (88 lb 10 oz)  Examination:  General exam: Appears calm and comfortable  Respiratory system: Course breath sounds bilaterally with exp wheezing and rhonchi in upper and lower lobes  Cardiovascular system: RRR. No JVD, murmurs, rubs, gallops or clicks. No pedal edema. Gastrointestinal system: Abdomen is nondistended, soft and nontender. No organomegaly or masses felt.  Central nervous system: Alert and oriented. No focal neurological deficits. Extremities: Symmetric 5 x 5 power. Skin: No rashes, lesions or ulcers Psychiatry: Judgement and insight appear normal. Mood & affect appropriate.   Data Reviewed: I have personally reviewed following labs and imaging studies  CBC:  Recent Labs Lab 11/12/15 1205 11/13/15 0410 11/14/15 0433  WBC 8.4 9.6 17.5*  NEUTROABS 6.4  --   --   HGB 13.2 11.2* 12.3  HCT 40.9 35.6* 39.4  MCV 92.1 92.2 91.6  PLT 160 147*  [id number]   Basic Metabolic Panel:  Recent Labs Lab 11/12/15 1205 11/13/15 0410 11/14/15 0433  NA 142 141 142  K 4.3 3.4* 3.8  CL 108 112* 110  CO2 26 21* 25  GLUCOSE 109* 217* 170*  BUN 20 19 23*  CREATININE 0.95 1.00 1.08*  CALCIUM 9.4 7.9* 9.2   Liver Function Tests:  Recent Labs Lab 11/12/15 1205  AST 28  ALT 19  ALKPHOS 94  BILITOT 0.5  PROT 6.2*  ALBUMIN 3.1*   Radiology Studies: Dg Esophagus Result Date: 11/14/2015 CLINICAL DATA:  Technically difficult exam due to patient's frail physical condition and inability to perform rapid bolus swallowing. Mild vestibular penetration, without evidence of aspiration. No evidence of esophageal mass, stricture, or obstruction. Extrinsic impression on posterior cervical esophagus due to prominent cervical spine osteophytosis. Mild esophageal dysmotility.   Dg Chest Portable 1 View Result Date: 11/12/2015 CLINICAL DATA: :COPD.  No pneumonia nor CHF. Aortic atherosclerosis.     Scheduled Meds: . amLODipine  10 mg Oral Daily  . aspirin  81 mg Oral Daily  . dextromethorphan-guaiFENesin  1 tablet Oral BID  . doxycycline (VIBRAMYCIN) IV  100 mg Intravenous Q12H  . heparin  5,000 Units Subcutaneous Q8H  . Influenza vac split quadrivalent PF  0.5 mL Intramuscular Tomorrow-1000  . insulin aspart  0-9 Units Subcutaneous TID WC  . isosorbide mononitrate  30 mg Oral Daily  . levalbuterol  1.25 mg Nebulization Q4H  . methylPREDNISolone (SOLU-MEDROL) injection  60 mg Intravenous Q6H  . metoprolol succinate  25 mg Oral Daily  . polyvinyl alcohol  1 drop Both Eyes QID  . tiotropium  18 mcg Inhalation Daily   Continuous Infusions:    LOS: 1 day   Time spent: 20 minutes   Faye Ramsay, MD [facility] Pager [phone]  If 7PM-7AM, please contact night-coverage [email or web address] Password [email or web address] 11/14/2015, 11:06 AM

## 2015-11-14 NOTE — Care Management Note (Signed)
Case Management Note  Patient Details  Name: [name] [name] MRN: [id number] Date of Birth: [birth year]  Subjective/Objective:    CM following for progression and d/c planning.                 Action/Plan: 11/14/2015 Met with pt re HHRN and HHPT, pt states that if she has a copay she will be unable to afford these services. [facility] notified of orders and will ask them to discuss any possible cost with pt prior to d/c. Pt has DME, states that her daughter who lives in [place] may come for a visit and she is a [employer] scientist. Currently pt lives alone with support of neighbors and friends. States that she was instructed by her daughter to stay off of her leg post skin graft. Therefore pt hesitant to do PT at this time. No DME needs.  Pt still SOB with frequent cough.   Expected Discharge Date:  11/17/2015.              Expected Discharge Plan:  [place]  In-House Referral:  NA  Discharge planning Services  CM Consult  Post Acute Care Choice:  Home Health Choice offered to:  Patient  DME Arranged:    DME Agency:     HH Arranged:  RN, PT Tat Momoli Agency:  [facility]  Status of Service:  In process, will continue to follow  If discussed at Long Length of Stay Meetings, dates discussed:    Additional Comments:  Adron Bene, RN 11/14/2015, 5:02 PM

## 2015-11-14 NOTE — Progress Notes (Signed)
Initial Nutrition Assessment  DOCUMENTATION CODES:   Severe malnutrition in context of chronic illness, Underweight  INTERVENTION:   - Discontinue Ensure Enlive oral nutrition supplement per pt request. - Provide Premier Protein oral nutrition supplement TID between meals. Each provides 160 kcal and 30 grams protein. - Encourage PO intake. - Will continue to monitor for nutrition-related needs.  NUTRITION DIAGNOSIS:   Malnutrition related to chronic illness as evidenced by percent weight loss of 14.6% in 7 months, severe depletion of muscle mass, severe depletion of body fat.  GOAL:   Patient will meet greater than or equal to 90% of their needs  MONITOR:   PO intake, Supplement acceptance, Labs, Weight trends  REASON FOR ASSESSMENT:   Consult Assessment of nutrition requirement/status  ASSESSMENT:   82 y.o. female with medical history significant of COPD, tobacco abuse, essential HTN, CAD, skin cancer LLE (2 weeks ago with skin graft), who had sudden onset of shortness of breath this morning. She states that her mobility has been limited after her skin cancer biopsy and skin graft procedure 2 weeks ago. She admits to nonproductive cough, but no fevers or chills. She denies any chest pain, no peripheral edema, no nausea or vomiting, no abdominal pain. She states that she has had decreased appetite over the past 2 weeks after her procedure. She is currently smoking but has cut down to 3 cigarettes a day. She does not require any oxygen at home.  Spoke with pt at bedside as she was finishing lunch. Pt had consumed approximately 75% of lunch meal. States she is very full. Pt reports appetite had been "slacking" PTA for some time but has improved since admission. Per chart, pt has been consuming 75-100% of all meals since admission. Pt states she is "eating too much." Intern discussed importance of frequent PO intake and encouraged pt to continue to eat three meals per day both during  admission and after discharge.  Pt reports eating 1 meal in the evening per day PTA: large salad; chicken, steak, fish, or pork chops; baked potato or broccoli; and 1/2 can of beer. Pt states she ate 2-3 meals per day as a child but transitioned to eating 1 meal per day during the entirety of her adulthood due to job schedule and raising 2 children. Pt states that in the past week PTA, she has been eating some Cheerios in the morning and in the evening for dinner that were left at her house by her daughter. States she might continue to consume these in the morning after discharge. Intern encouraged breakfast intake. Pt cooks her own meals.  Pt reveals main reason why intake has recently declined is related to her recent leg surgery and back pain. Pt states that the pain has decreased her mobility and that she has less energy than usual.  Pt states she has been given oral nutrition supplements in the past and expresses that she does not like the consistency. Ensure Enlive supplements BID ordered now. Will discontinue Ensure Enlive order and initiate Premier Protein TID due to thinner consistency. Pt amenable to change.  Per chart, pt has lost 6.9 kg in past 7 months (14.6%, significant for timeframe). Pt states her UBW is 120# and that she last weighed this "about 1 year ago."  Medications reviewed and include 20 mg IV Lasix once, 5,000 units IV heparin every 8 hrs, sliding scale Novolog  Labs reviewed and include elevated BUN (23 mg/dL), elevated creatinine (1.08 mg/dL) CBGs: 133-171 mg/dL, trending down  NFPE: Exam completed. Severe fat depletion, severe muscle depletion, and no edema noted.  Diet Order:  Diet regular Room service appropriate? Yes; Fluid consistency: Thin  Skin:  Reviewed, no issues  Last BM:  11/10/15  Height:   Ht Readings from Last 1 Encounters:  11/12/15 5\' 4"  (1.626 m)    Weight:   Wt Readings from Last 1 Encounters:  11/13/15 88 lb 10 oz (40.2 kg)    Ideal  Body Weight:  54.5 kg  BMI:  Body mass index is 15.21 kg/m.  Estimated Nutritional Needs:   Kcal:  1300-1500 kcal (33-36 kcal/kg)  Protein:  52-64 grams (1.4-1.6 g/kg)  Fluid:  1.3-1.5 L/day  EDUCATION NEEDS:   No education needs identified at this time  Jeb Levering Dietetic Intern Pager Number: [phone]

## 2015-11-14 NOTE — Progress Notes (Signed)
Physical Therapy Treatment Patient Details Name: [name] [name] MRN: [id number] DOB: 05-10-1927 Today's Date: 12-09-2015    History of Present Illness 82 y.o. female admitted with respiratory failure. PMH of COPD, CAD, PVD, spinal stenosis, back surgery, breast cancer, recent skin graft LLE (per chart 2 weeks ago, per pt 3 days ago).     PT Comments    Patient doing well with mobility and gait.  Follow Up Recommendations  Home health PT     Equipment Recommendations  None recommended by PT    Recommendations for Other Services       Precautions / Restrictions Precautions Precautions: Fall Precaution Comments: 1 fall in past year, fell off deck while staining it (? due to fumes) Restrictions Weight Bearing Restrictions: No    Mobility  Bed Mobility               General bed mobility comments: Patient in chair  Transfers Overall transfer level: Needs assistance Equipment used: Rolling walker (2 wheeled) Transfers: Sit to/from Stand Sit to Stand: Min guard         General transfer comment: No physical assist needed.  Ambulation/Gait Ambulation/Gait assistance: Min guard Ambulation Distance (Feet): 200 Feet Assistive device: Rolling walker (2 wheeled) Gait Pattern/deviations: Step-through pattern;Decreased stride length   Gait velocity interpretation: at or above normal speed for age/gender General Gait Details: Patient demonstrates safe use of RW.  No loss of balance during gait.  Quick gait speed.  Required 2 standing rest breaks.  Dyspnea 3/4.   Stairs            Wheelchair Mobility    Modified Rankin (Stroke Patients Only)       Balance Overall balance assessment: No apparent balance deficits (not formally assessed)                                  Cognition Arousal/Alertness: Awake/alert Behavior During Therapy: WFL for tasks assessed/performed Overall Cognitive Status: Within Functional Limits for tasks  assessed                      Exercises      General Comments General comments (skin integrity, edema, etc.): Bandaging noted LLE      Pertinent Vitals/Pain Pain Assessment: Faces Faces Pain Scale: Hurts a little bit Pain Location: LE's Pain Descriptors / Indicators: Sore Pain Intervention(s): Monitored during session;Repositioned    Home Living                      Prior Function            PT Goals (current goals can now be found in the care plan section) Progress towards PT goals: Progressing toward goals    Frequency    Min 3X/week      PT Plan Current plan remains appropriate    Co-evaluation             End of Session Equipment Utilized During Treatment: Gait belt Activity Tolerance: Patient tolerated treatment well Patient left: in chair;with call bell/phone within reach;with nursing/sitter in room     Time: [id number] PT Time Calculation (min) (ACUTE ONLY): 15 min  Charges:  $Gait Training: 8-22 mins                    G Codes:      Despina Pole 09-Dec-2015, 2:44 PM Carita Pian. Rosana Hoes, PT,  [place] Pager [phone]

## 2015-11-15 DIAGNOSIS — I10 Essential (primary) hypertension: Secondary | ICD-10-CM

## 2015-11-15 DIAGNOSIS — J9601 Acute respiratory failure with hypoxia: Secondary | ICD-10-CM

## 2015-11-15 DIAGNOSIS — E43 Unspecified severe protein-calorie malnutrition: Secondary | ICD-10-CM

## 2015-11-15 LAB — CBC
HCT: 41.5 % (ref 36.0–46.0)
HEMOGLOBIN: 13.2 g/dL (ref 12.0–15.0)
MCH: 29 pg (ref 26.0–34.0)
MCHC: 31.8 g/dL (ref 30.0–36.0)
MCV: 91.2 fL (ref 78.0–100.0)
PLATELETS: 173 10*3/uL (ref 150–400)
RBC: 4.55 MIL/uL (ref 3.87–5.11)
RDW: 15.3 % (ref 11.5–15.5)
WBC: 14.4 10*3/uL — ABNORMAL HIGH (ref 4.0–10.5)

## 2015-11-15 LAB — BASIC METABOLIC PANEL
Anion gap: 9 (ref 5–15)
BUN: 37 mg/dL — AB (ref 6–20)
CALCIUM: 9.4 mg/dL (ref 8.9–10.3)
CHLORIDE: 108 mmol/L (ref 101–111)
CO2: 27 mmol/L (ref 22–32)
CREATININE: 1.01 mg/dL — AB (ref 0.44–1.00)
GFR, EST AFRICAN AMERICAN: 57 mL/min — AB (ref >60)
GFR, EST NON AFRICAN AMERICAN: 49 mL/min — AB (ref >60)
Glucose, Bld: 127 mg/dL — ABNORMAL HIGH (ref 65–99)
Potassium: 3.4 mmol/L — ABNORMAL LOW (ref 3.5–5.1)
SODIUM: 144 mmol/L (ref 135–145)

## 2015-11-15 LAB — GLUCOSE, CAPILLARY
GLUCOSE-CAPILLARY: 119 mg/dL — AB (ref 65–99)
GLUCOSE-CAPILLARY: 102 mg/dL — AB (ref 65–99)
GLUCOSE-CAPILLARY: 151 mg/dL — AB (ref 65–99)

## 2015-11-15 LAB — HEMOGLOBIN A1C
HEMOGLOBIN A1C: 5.5 % (ref 4.8–5.6)
MEAN PLASMA GLUCOSE: 111 mg/dL

## 2015-11-15 MED ORDER — SENNOSIDES-DOCUSATE SODIUM 8.6-50 MG PO TABS
1.0000 | ORAL_TABLET | Freq: Two times a day (BID) | ORAL | Status: AC
  Administered 2015-11-15 (×2): 1 via ORAL
  Filled 2015-11-15 (×2): qty 1

## 2015-11-15 MED ORDER — AMOXICILLIN-POT CLAVULANATE 875-125 MG PO TABS
1.0000 | ORAL_TABLET | Freq: Two times a day (BID) | ORAL | Status: DC
  Administered 2015-11-15 – 2015-11-16 (×3): 1 via ORAL
  Filled 2015-11-15 (×3): qty 1

## 2015-11-15 MED ORDER — METHYLPREDNISOLONE SODIUM SUCC 125 MG IJ SOLR
60.0000 mg | Freq: Two times a day (BID) | INTRAMUSCULAR | Status: DC
  Administered 2015-11-15 – 2015-11-17 (×5): 60 mg via INTRAVENOUS
  Filled 2015-11-15 (×5): qty 2

## 2015-11-15 MED ORDER — POLYETHYLENE GLYCOL 3350 17 G PO PACK
17.0000 g | PACK | Freq: Every day | ORAL | Status: DC
  Administered 2015-11-15 – 2015-11-18 (×4): 17 g via ORAL
  Filled 2015-11-15 (×3): qty 1

## 2015-11-15 MED ORDER — FUROSEMIDE 10 MG/ML IJ SOLN
40.0000 mg | Freq: Once | INTRAMUSCULAR | Status: AC
  Administered 2015-11-15: 40 mg via INTRAVENOUS
  Filled 2015-11-15: qty 4

## 2015-11-15 NOTE — Progress Notes (Signed)
Patient ID: [name] [name], female   DOB: 1927-04-14, 82 y.o.   MRN: [id number]    PROGRESS NOTE    [name] [name]  [id number] DOB: Mar 05, 1927 DOA: 11/12/2015  PCP: Lottie Dawson, MD   Brief Narrative:  82 y.o. female with medical history significant of COPD, tobacco abuse, essential HTN, CAD, skin cancer LLE (2 weeks ago with skin graft), who had sudden onset of shortness of breath this morning. She states that her mobility has been limited after her skin cancer biopsy and skin graft procedure 2 weeks ago. She admits to nonproductive cough, but no fevers or chills. She denies any chest pain, no peripheral edema, no nausea or vomiting, no abdominal pain.   Assessment & Plan:   Principal Problem:   Acute hypoxic respiratory failure secondary to acute COPD exacerbation (HCC), aspiration pneumonia, acute diastolic CHF - breath sounds are still course with exp wheezing and rhonchi  - will continue solumedrol IV for now 60 mg IV but we can taper down from Q6 to Q12 hours  - will also continue duonebs scheduled and as needed, currently on doxy IV and will continue same regimen day #4, due to concern for aspiration will add Augmentin to doxy  - continue Spiriva - CT chest notable for emphysema bilateral endobronchial disease, narrowing and occlusion of multiple airways, ? related to mucus and aspiration, bilateral pleural effusions with bibasilar atelectasis, centrilobular emphysema - added lasix as well to see if this will help, on 10/13 given lasix 20 mg IV but since not much improvement, will increase to 40 mg IV QD today 10/14 - ECHO pending   Active Problems:   Essential hypertension, benign - SBP in 140's this AM - continue Norvasc, Metoprolol, Imdur     TOBACCO USE - consultation provided - OK to use Nicotine patch if needed     Hypokalemia - supplement and repeat BMP in AM    Dysphagia - esophagogram technically difficult exam due to patient's frail  physical condition and inability to perform rapid bolus swallowing.  - noted mild vestibular penetration, without evidence of aspiration, no evidence of esophageal mass, stricture, or obstruction - Extrinsic impression on posterior cervical esophagus due to prominent cervical spine osteophytosis. Mild esophageal dysmotility - OK to advance diet as pt able to tolerate     Leukocytosis - suspect from steroids, WBC trending down  - CBC in AM    Underweight, severe PCM  - Body mass index is 15.25 kg/m.  DVT prophylaxis: Heparin SQ Code Status: Full  Family Communication: Patient at bedside, daughter who lives in [place] updated over the phone  Disposition Plan: Home in 2-3 days   Consultants:   None  Procedures:   None  Antimicrobials:   Doxycycline 10/11 -->   Augmentin 10/14 -->  Subjective: Stil with exertional dyspnea.   Objective: Vitals:   11/14/15 2338 11/15/15 0354 11/15/15 0523 11/15/15 0919  BP:   (!) 145/50 (!) 148/60  Pulse: 76 84 80 70  Resp: 18 18 19 20   Temp:   98.4 F (36.9 C) 97.2 F (36.2 C)  TempSrc:   Oral Oral  SpO2:   93% 95%  Weight:      Height:        Intake/Output Summary (Last 24 hours) at 11/15/15 1218 Last data filed at 11/15/15 0900  Gross per 24 hour  Intake             1055 ml  Output  400 ml  Net              655 ml   Filed Weights   11/12/15 2006 11/13/15 2044 11/14/15 2116  Weight: 40.5 kg (89 lb 4.6 oz) 40.2 kg (88 lb 10 oz) 40.3 kg (88 lb 13.5 oz)    Examination:  General exam: Appears calm and comfortable  Respiratory system: Course breath sounds bilaterally with exp wheezing and rhonchi in upper and lower lobes  Cardiovascular system: RRR. No JVD, murmurs, rubs, gallops or clicks. No pedal edema. Gastrointestinal system: Abdomen is nondistended, soft and nontender. No organomegaly or masses felt.  Central nervous system: Alert and oriented. No focal neurological deficits. Extremities: Symmetric 5 x 5  power.  Data Reviewed: I have personally reviewed following labs and imaging studies  CBC:  Recent Labs Lab 11/12/15 1205 11/13/15 0410 11/14/15 0433 11/15/15 0606  WBC 8.4 9.6 17.5* 14.4*  NEUTROABS 6.4  --   --   --   HGB 13.2 11.2* 12.3 13.2  HCT 40.9 35.6* 39.4 41.5  MCV 92.1 92.2 91.6 91.2  PLT 160 147* 173 [id number]   Basic Metabolic Panel:  Recent Labs Lab 11/12/15 1205 11/13/15 0410 11/14/15 0433 11/15/15 0606  NA 142 141 142 144  K 4.3 3.4* 3.8 3.4*  CL 108 112* 110 108  CO2 26 21* 25 27  GLUCOSE 109* 217* 170* 127*  BUN 20 19 23* 37*  CREATININE 0.95 1.00 1.08* 1.01*  CALCIUM 9.4 7.9* 9.2 9.4   Liver Function Tests:  Recent Labs Lab 11/12/15 1205  AST 28  ALT 19  ALKPHOS 94  BILITOT 0.5  PROT 6.2*  ALBUMIN 3.1*   Radiology Studies: Dg Esophagus Result Date: 11/14/2015 CLINICAL DATA:  Technically difficult exam due to patient's frail physical condition and inability to perform rapid bolus swallowing. Mild vestibular penetration, without evidence of aspiration. No evidence of esophageal mass, stricture, or obstruction. Extrinsic impression on posterior cervical esophagus due to prominent cervical spine osteophytosis. Mild esophageal dysmotility.   Dg Chest Portable 1 View Result Date: 11/12/2015 CLINICAL DATA: :COPD.  No pneumonia nor CHF. Aortic atherosclerosis.     Scheduled Meds: . amLODipine  10 mg Oral Daily  . aspirin  81 mg Oral Daily  . dextromethorphan-guaiFENesin  1 tablet Oral BID  . doxycycline (VIBRAMYCIN) IV  100 mg Intravenous Q12H  . heparin  5,000 Units Subcutaneous Q8H  . Influenza vac split quadrivalent PF  0.5 mL Intramuscular Tomorrow-1000  . insulin aspart  0-9 Units Subcutaneous TID WC  . isosorbide mononitrate  30 mg Oral Daily  . levalbuterol  1.25 mg Nebulization Q4H  . [START ON 11/16/2015] methylPREDNISolone (SOLU-MEDROL) injection  60 mg Intravenous Q12H  . metoprolol succinate  25 mg Oral Daily  . polyethylene  glycol  17 g Oral Daily  . polyvinyl alcohol  1 drop Both Eyes QID  . protein supplement shake  11 oz Oral TID BM  . senna-docusate  1 tablet Oral BID  . tiotropium  18 mcg Inhalation Daily   Continuous Infusions:    LOS: 2 days   Time spent: 20 minutes   Faye Ramsay, MD [facility] Pager [phone]  If 7PM-7AM, please contact night-coverage [email or web address] Password [email or web address] 11/15/2015, 12:18 PM

## 2015-11-16 ENCOUNTER — Inpatient Hospital Stay (HOSPITAL_COMMUNITY): Payer: Medicare HMO

## 2015-11-16 DIAGNOSIS — R0600 Dyspnea, unspecified: Secondary | ICD-10-CM

## 2015-11-16 LAB — GLUCOSE, CAPILLARY
GLUCOSE-CAPILLARY: 121 mg/dL — AB (ref 65–99)
GLUCOSE-CAPILLARY: 102 mg/dL — AB (ref 65–99)
Glucose-Capillary: 109 mg/dL — ABNORMAL HIGH (ref 65–99)
Glucose-Capillary: 241 mg/dL — ABNORMAL HIGH (ref 65–99)

## 2015-11-16 LAB — BASIC METABOLIC PANEL
Anion gap: 10 (ref 5–15)
BUN: 42 mg/dL — AB (ref 6–20)
CO2: 26 mmol/L (ref 22–32)
Calcium: 9.4 mg/dL (ref 8.9–10.3)
Chloride: 108 mmol/L (ref 101–111)
Creatinine, Ser: 1.09 mg/dL — ABNORMAL HIGH (ref 0.44–1.00)
GFR calc Af Amer: 52 mL/min — ABNORMAL LOW (ref >60)
GFR, EST NON AFRICAN AMERICAN: 45 mL/min — AB (ref >60)
GLUCOSE: 130 mg/dL — AB (ref 65–99)
POTASSIUM: 3.1 mmol/L — AB (ref 3.5–5.1)
Sodium: 144 mmol/L (ref 135–145)

## 2015-11-16 LAB — ECHOCARDIOGRAM COMPLETE
HEIGHTINCHES: 64 in
WEIGHTICAEL: 1421.53 [oz_av]

## 2015-11-16 LAB — CBC
HEMATOCRIT: 41.1 % (ref 36.0–46.0)
Hemoglobin: 13.2 g/dL (ref 12.0–15.0)
MCH: 29.1 pg (ref 26.0–34.0)
MCHC: 32.1 g/dL (ref 30.0–36.0)
MCV: 90.7 fL (ref 78.0–100.0)
PLATELETS: 151 10*3/uL (ref 150–400)
RBC: 4.53 MIL/uL (ref 3.87–5.11)
RDW: 15.4 % (ref 11.5–15.5)
WBC: 9.7 10*3/uL (ref 4.0–10.5)

## 2015-11-16 MED ORDER — AMOXICILLIN-POT CLAVULANATE 500-125 MG PO TABS
1.0000 | ORAL_TABLET | Freq: Two times a day (BID) | ORAL | Status: DC
  Administered 2015-11-16 – 2015-11-18 (×4): 500 mg via ORAL
  Filled 2015-11-16 (×4): qty 1

## 2015-11-16 MED ORDER — POTASSIUM CHLORIDE CRYS ER 20 MEQ PO TBCR
40.0000 meq | EXTENDED_RELEASE_TABLET | Freq: Once | ORAL | Status: AC
  Administered 2015-11-16: 40 meq via ORAL
  Filled 2015-11-16: qty 2

## 2015-11-16 MED ORDER — FLUCONAZOLE 100 MG PO TABS
100.0000 mg | ORAL_TABLET | Freq: Every day | ORAL | Status: DC
  Administered 2015-11-16 – 2015-11-18 (×3): 100 mg via ORAL
  Filled 2015-11-16 (×3): qty 1

## 2015-11-16 MED ORDER — LEVALBUTEROL HCL 1.25 MG/0.5ML IN NEBU
1.2500 mg | INHALATION_SOLUTION | Freq: Four times a day (QID) | RESPIRATORY_TRACT | Status: DC
  Administered 2015-11-17 – 2015-11-18 (×5): 1.25 mg via RESPIRATORY_TRACT
  Filled 2015-11-16 (×6): qty 0.5

## 2015-11-16 NOTE — Care Management (Signed)
CM met with patient at bedside to discuss decision on [facility] services. Patient reiterated, she is not interested if there is a co-pay. Contacted [facility]erm[facility] Liaison for [facility] regarding patient's concerns, was informed that [facility] cannot provided [facility] services to [facility] beneficiaries at this time.  Updated patient choices somewhat limited with [employer]. Discussed [facility], referral faxed to [facility] awaiting response from [facility] concerning [facility] and copays. Also discussed patient may benefit for [facility] for community CM for care transition, patient is agreeable, referral placed as well. Patient verbalized understanding. Unit CM will follow up with care transitional plan.

## 2015-11-16 NOTE — Progress Notes (Signed)
Echocardiogram 2D Echocardiogram has been performed.  Aggie Cosier 11/16/2015, 10:39 AM

## 2015-11-16 NOTE — Progress Notes (Signed)
Patient ID: [name] [name], female   DOB: August 27, 1927, 82 y.o.   MRN: [id number]    PROGRESS NOTE    [name] [name]  [id number] DOB: 1927-07-19 DOA: 11/12/2015  PCP: Lottie Dawson, MD   Brief Narrative:  82 y.o. female with medical history significant of COPD, tobacco abuse, essential HTN, CAD, skin cancer LLE (2 weeks ago with skin graft), who had sudden onset of shortness of breath this morning. She states that her mobility has been limited after her skin cancer biopsy and skin graft procedure 2 weeks ago. She admits to nonproductive cough, but no fevers or chills. She denies any chest pain, no peripheral edema, no nausea or vomiting, no abdominal pain.   Assessment & Plan:   Principal Problem:   Acute hypoxic respiratory failure secondary to acute COPD exacerbation (HCC), aspiration pneumonia, acute diastolic CHF - breath sounds are still course with exp wheezing and rhonchi  - will continue solumedrol IV 60 mg IV BID - will also continue duonebs scheduled and as needed, currently on doxy IV and will continue same regimen day #5, due to concern for aspiration added Augmentin 10/14 - continue Spiriva - CT chest notable for emphysema bilateral endobronchial disease, narrowing and occlusion of multiple airways, ? related to mucus and aspiration, bilateral pleural effusions with bibasilar atelectasis, centrilobular emphysema - added lasix as well to see if this will help, on 10/13 given lasix 20 mg IV but since not much improvement, increased Lasix to 40 mg IV QD 10/14, weight is down by 2 lbs and pt does sound better on lung exam, will continue same dose  - ECHO done but results still pending   Active Problems:   Essential hypertension, benign - SBP in 130's this AM - continue Norvasc, Metoprolol, Imdur     TOBACCO USE - consultation provided - OK to use Nicotine patch if needed     Hypokalemia - supplement and repeat BMP in AM    Dysphagia - esophagogram  technically difficult exam due to patient's frail physical condition and inability to perform rapid bolus swallowing.  - noted mild vestibular penetration, without evidence of aspiration, no evidence of esophageal mass, stricture, or obstruction - Extrinsic impression on posterior cervical esophagus due to prominent cervical spine osteophytosis. Mild esophageal dysmotility - OK to advance diet as pt able to tolerate     Leukocytosis - suspect from steroids, WBC trending down and now WNL - CBC in AM    Underweight, severe PCM  - Body mass index is 15.25 kg/m.  DVT prophylaxis: Heparin SQ Code Status: Full  Family Communication: Patient at bedside, daughter has my number to call for updates  Disposition Plan: Home in 1-2 days   Consultants:   None  Procedures:   None  Antimicrobials:   Doxycycline 10/11 -->   Augmentin 10/14 -->  Subjective: Stil with exertional dyspnea.   Objective: Vitals:   11/15/15 2133 11/16/15 0501 11/16/15 0849 11/16/15 0851  BP: (!) 149/55 (!) 138/53    Pulse: 65 71    Resp: 18 16    Temp: 98.4 F (36.9 C) 98.4 F (36.9 C)    TempSrc: Oral Oral    SpO2: 94% 92% 93% 92%  Weight:      Height:        Intake/Output Summary (Last 24 hours) at 11/16/15 1109 Last data filed at 11/16/15 0144  Gross per 24 hour  Intake              250  ml  Output              350 ml  Net             -100 ml   Filed Weights   11/12/15 2006 11/13/15 2044 11/14/15 2116  Weight: 40.5 kg (89 lb 4.6 oz) 40.2 kg (88 lb 10 oz) 40.3 kg (88 lb 13.5 oz)    Examination:  General exam: Appears calm and comfortable  Respiratory system: better air movement with bilateral rhonchi and diminished breath sounds at bases  Cardiovascular system: RRR. No JVD, murmurs, rubs, gallops or clicks. No pedal edema. Gastrointestinal system: Abdomen is nondistended, soft and nontender. No organomegaly or masses felt.  Central nervous system: Alert and oriented. No focal neurological  deficits. Extremities: Symmetric 5 x 5 power.  Data Reviewed: I have personally reviewed following labs and imaging studies  CBC:  Recent Labs Lab 11/12/15 1205 11/13/15 0410 11/14/15 0433 11/15/15 0606 11/16/15 0525  WBC 8.4 9.6 17.5* 14.4* 9.7  NEUTROABS 6.4  --   --   --   --   HGB 13.2 11.2* 12.3 13.2 13.2  HCT 40.9 35.6* 39.4 41.5 41.1  MCV 92.1 92.2 91.6 91.2 90.7  PLT 160 147* 173 173 [id number]   Basic Metabolic Panel:  Recent Labs Lab 11/12/15 1205 11/13/15 0410 11/14/15 0433 11/15/15 0606 11/16/15 0525  NA 142 141 142 144 144  K 4.3 3.4* 3.8 3.4* 3.1*  CL 108 112* 110 108 108  CO2 26 21* 25 27 26   GLUCOSE 109* 217* 170* 127* 130*  BUN 20 19 23* 37* 42*  CREATININE 0.95 1.00 1.08* 1.01* 1.09*  CALCIUM 9.4 7.9* 9.2 9.4 9.4   Liver Function Tests:  Recent Labs Lab 11/12/15 1205  AST 28  ALT 19  ALKPHOS 94  BILITOT 0.5  PROT 6.2*  ALBUMIN 3.1*   Radiology Studies: Dg Esophagus Result Date: 11/14/2015 CLINICAL DATA:  Technically difficult exam due to patient's frail physical condition and inability to perform rapid bolus swallowing. Mild vestibular penetration, without evidence of aspiration. No evidence of esophageal mass, stricture, or obstruction. Extrinsic impression on posterior cervical esophagus due to prominent cervical spine osteophytosis. Mild esophageal dysmotility.   Dg Chest Portable 1 View Result Date: 11/12/2015 CLINICAL DATA: :COPD.  No pneumonia nor CHF. Aortic atherosclerosis.     Scheduled Meds: . amLODipine  10 mg Oral Daily  . amoxicillin-clavulanate  1 tablet Oral Q12H  . aspirin  81 mg Oral Daily  . dextromethorphan-guaiFENesin  1 tablet Oral BID  . doxycycline (VIBRAMYCIN) IV  100 mg Intravenous Q12H  . heparin  5,000 Units Subcutaneous Q8H  . Influenza vac split quadrivalent PF  0.5 mL Intramuscular Tomorrow-1000  . insulin aspart  0-9 Units Subcutaneous TID WC  . isosorbide mononitrate  30 mg Oral Daily  . levalbuterol   1.25 mg Nebulization Q4H  . methylPREDNISolone (SOLU-MEDROL) injection  60 mg Intravenous Q12H  . metoprolol succinate  25 mg Oral Daily  . polyethylene glycol  17 g Oral Daily  . polyvinyl alcohol  1 drop Both Eyes QID  . potassium chloride  40 mEq Oral Once  . protein supplement shake  11 oz Oral TID BM  . tiotropium  18 mcg Inhalation Daily   Continuous Infusions:    LOS: 3 days   Time spent: 20 minutes   Faye Ramsay, MD [facility] Pager [phone]  If 7PM-7AM, please contact night-coverage [email or web address] Password [email or web address] 11/16/2015, 11:09 AM

## 2015-11-17 LAB — BASIC METABOLIC PANEL
ANION GAP: 9 (ref 5–15)
BUN: 40 mg/dL — ABNORMAL HIGH (ref 6–20)
CALCIUM: 9.3 mg/dL (ref 8.9–10.3)
CHLORIDE: 109 mmol/L (ref 101–111)
CO2: 26 mmol/L (ref 22–32)
Creatinine, Ser: 0.93 mg/dL (ref 0.44–1.00)
GFR calc non Af Amer: 54 mL/min — ABNORMAL LOW (ref >60)
Glucose, Bld: 152 mg/dL — ABNORMAL HIGH (ref 65–99)
Potassium: 4.4 mmol/L (ref 3.5–5.1)
Sodium: 144 mmol/L (ref 135–145)

## 2015-11-17 LAB — CBC
HEMATOCRIT: 40 % (ref 36.0–46.0)
HEMOGLOBIN: 12.9 g/dL (ref 12.0–15.0)
MCH: 29.5 pg (ref 26.0–34.0)
MCHC: 32.3 g/dL (ref 30.0–36.0)
MCV: 91.3 fL (ref 78.0–100.0)
Platelets: 120 10*3/uL — ABNORMAL LOW (ref 150–400)
RBC: 4.38 MIL/uL (ref 3.87–5.11)
RDW: 15.4 % (ref 11.5–15.5)
WBC: 9.4 10*3/uL (ref 4.0–10.5)

## 2015-11-17 LAB — GLUCOSE, CAPILLARY
GLUCOSE-CAPILLARY: 164 mg/dL — AB (ref 65–99)
GLUCOSE-CAPILLARY: 110 mg/dL — AB (ref 65–99)
Glucose-Capillary: 161 mg/dL — ABNORMAL HIGH (ref 65–99)
Glucose-Capillary: 131 mg/dL — ABNORMAL HIGH (ref 65–99)
Glucose-Capillary: 115 mg/dL — ABNORMAL HIGH (ref 65–99)

## 2015-11-17 MED ORDER — PREDNISONE 50 MG PO TABS
50.0000 mg | ORAL_TABLET | Freq: Every day | ORAL | Status: DC
  Administered 2015-11-18: 50 mg via ORAL
  Filled 2015-11-17: qty 1

## 2015-11-17 MED ORDER — DOXYCYCLINE HYCLATE 100 MG PO TABS
100.0000 mg | ORAL_TABLET | Freq: Two times a day (BID) | ORAL | Status: DC
  Administered 2015-11-17 – 2015-11-18 (×3): 100 mg via ORAL
  Filled 2015-11-17 (×3): qty 1

## 2015-11-17 NOTE — Progress Notes (Signed)
Physical Therapy Treatment Patient Details Name: [name] [name] MRN: [id number] DOB: 1927/03/25 Today's Date: 11/17/2015    History of Present Illness 82 y.o. female admitted with respiratory failure. PMH of COPD, CAD, PVD, spinal stenosis, back surgery, breast cancer, recent skin graft LLE (per chart 2 weeks ago, per pt 3 days ago).     PT Comments    Continuing progress with activity tolerance and functional mobility; On track for dc home in 1-2 days (tomorrow is fine from a mobility standpoint); no need for standing rest breaks, no DOE noted with amb on Room Air  Follow Up Recommendations  Home health PT     Equipment Recommendations  None recommended by PT    Recommendations for Other Services       Precautions / Restrictions Precautions Precautions: Fall Precaution Comments: 1 fall in past year, fell off deck while staining it (? due to fumes)    Mobility  Bed Mobility Overal bed mobility: Needs Assistance Bed Mobility: Sit to Supine       Sit to supine: Supervision   General bed mobility comments: Cues for best positioning in bed  Transfers Overall transfer level: Needs assistance Equipment used: Rolling walker (2 wheeled) Transfers: Sit to/from Stand Sit to Stand: Supervision         General transfer comment: No physical assist needed. Cues for hand placement  Ambulation/Gait Ambulation/Gait assistance: Supervision Ambulation Distance (Feet): 200 Feet Assistive device: Rolling walker (2 wheeled) Gait Pattern/deviations: Step-through pattern   Gait velocity interpretation: at or above normal speed for age/gender General Gait Details: Patient demonstrates safe use of RW.  No loss of balance during gait.  Quick gait speed.  No standing rest breaks required; no DOE noted   Financial trader Rankin (Stroke Patients Only)       Balance Overall balance assessment: No apparent balance deficits (not  formally assessed)                                  Cognition Arousal/Alertness: Awake/alert Behavior During Therapy: WFL for tasks assessed/performed Overall Cognitive Status: Within Functional Limits for tasks assessed                      Exercises      General Comments General comments (skin integrity, edema, etc.): Bandage L lower leg      Pertinent Vitals/Pain Pain Assessment: Faces Faces Pain Scale: Hurts a little bit Pain Location: LEs and pt reports chronic back pain Pain Descriptors / Indicators: Sore Pain Intervention(s): Monitored during session    Home Living                      Prior Function            PT Goals (current goals can now be found in the care plan section) Acute Rehab PT Goals Patient Stated Goal: to get stronger PT Goal Formulation: With patient Time For Goal Achievement: 11/27/15 Potential to Achieve Goals: Good Progress towards PT goals: Progressing toward goals    Frequency    Min 3X/week      PT Plan Current plan remains appropriate    Co-evaluation             End of Session   Activity Tolerance: Patient tolerated treatment well Patient left: in bed;with  call bell/phone within reach     Time: 1336-1356 PT Time Calculation (min) (ACUTE ONLY): 20 min  Charges:  $Gait Training: 8-22 mins                    G Codes:      Roney Marion Hamff 11/17/2015, 2:05 PM  Roney Marion, [place] Pager [phone] Office [phone]

## 2015-11-17 NOTE — Care Management Note (Signed)
Case Management Note  Patient Details  Name: KIMEKO HOFMANN MRN: [id number] Date of Birth: 02-27-1927  Subjective/Objective:     CM following for progression and d/c planning.                Action/Plan: 11/17/2015 Note weekend CM made referral to [facility] for [facility] services. Per [facility] rep, Bethena Roys this pt will have no copay, as this was a great concern for this pt. Also noted [facility] referral, however [facility] is unable to work with [name], as this CM discussed with [facility] last week and pt was informed.   Expected Discharge Date:  11/13/15               Expected Discharge Plan:  [place]  In-House Referral:  NA  Discharge planning Services  CM Consult  Post Acute Care Choice:  Home Health Choice offered to:  Patient  DME Arranged:    DME Agency:     HH Arranged:  RN, PT HH Agency:  [place]  Status of Service:  Completed, signed off  If discussed at [place] of Stay Meetings, dates discussed:    Additional Comments:  Adron Bene, RN 11/17/2015, 11:03 AM

## 2015-11-17 NOTE — Progress Notes (Signed)
Patient ID: [name] [name], female   DOB: July 15, 1927, 82 y.o.   MRN: [id number]    PROGRESS NOTE    [name] [name]  [id number] DOB: [birth year] DOA: 11/12/2015  PCP: Lottie Dawson, MD   Brief Narrative:  82 y.o. female with medical history significant of COPD, tobacco abuse, essential HTN, CAD, skin cancer LLE (2 weeks ago with skin graft), who had sudden onset of shortness of breath this morning. She states that her mobility has been limited after her skin cancer biopsy and skin graft procedure 2 weeks ago. She admits to nonproductive cough, but no fevers or chills. She denies any chest pain, no peripheral edema, no nausea or vomiting, no abdominal pain.   Assessment & Plan:   Principal Problem:   Acute hypoxic respiratory failure secondary to acute COPD exacerbation (HCC), aspiration pneumonia, acute diastolic CHF - breath sounds are more clear this AM but still with rhonchi  - will also continue duonebs scheduled and as needed, currently on doxy IV and will continue same regimen day #6, due to concern for aspiration added Augmentin 10/14 - continue Spiriva - CT chest notable for emphysema bilateral endobronchial disease, narrowing and occlusion of multiple airways, ? related to mucus and aspiration, bilateral pleural effusions with bibasilar atelectasis, centrilobular emphysema - added lasix as well to see if this will help, on 10/13 given lasix 20 mg IV but since not much improvement, increased Lasix to 40 mg IV QD 10/14, weight is down by 2 lbs and pt does sound better on lung exam, will continue same dose  - ECHO done EF, stable but grade I diastolic CHF noted  - change solumedrol to oral Prednisone today and continue to taper down   Active Problems:   Essential hypertension, benign - SBP in 130's this AM - continue Norvasc, Metoprolol, Imdur     TOBACCO USE - consultation provided - OK to use Nicotine patch if needed     Hypokalemia - supplemented and  WNL this AM    Dysphagia - esophagogram technically difficult exam due to patient's frail physical condition and inability to perform rapid bolus swallowing.  - noted mild vestibular penetration, without evidence of aspiration, no evidence of esophageal mass, stricture, or obstruction - Extrinsic impression on posterior cervical esophagus due to prominent cervical spine osteophytosis. Mild esophageal dysmotility - OK to advance diet as pt able to tolerate     Leukocytosis - suspect from steroids, WBC trending down and now WNL - CBC in AM    Underweight, severe PCM  - Body mass index is 15.25 kg/m.  DVT prophylaxis: Heparin SQ Code Status: Full  Family Communication: Patient at bedside, daughter has my number to call for updates  Disposition Plan: Home in 1-2 days   Consultants:   None  Procedures:   None  Antimicrobials:   Doxycycline 10/11 -->   Augmentin 10/14 -->  Subjective: Stil with exertional dyspnea.   Objective: Vitals:   11/17/15 0801 11/17/15 1000 11/17/15 1122 11/17/15 1136  BP:  (!) 131/44 (!) 138/59   Pulse:  62    Resp:  18    Temp:  98.6 F (37 C)    TempSrc:  Oral    SpO2: 98% 99%  100%  Weight:      Height:        Intake/Output Summary (Last 24 hours) at 11/17/15 1156 Last data filed at 11/17/15 0900  Gross per 24 hour  Intake  240 ml  Output                0 ml  Net              240 ml   Filed Weights   11/12/15 2006 11/13/15 2044 11/14/15 2116  Weight: 40.5 kg (89 lb 4.6 oz) 40.2 kg (88 lb 10 oz) 40.3 kg (88 lb 13.5 oz)    Examination:  General exam: Appears calm and comfortable  Respiratory system: better air movement with bilateral rhonchi but overall better  Cardiovascular system: RRR. No JVD, murmurs, rubs, gallops or clicks. No pedal edema. Gastrointestinal system: Abdomen is nondistended, soft and nontender. No organomegaly or masses felt.  Central nervous system: Alert and oriented. No focal neurological  deficits. Extremities: Symmetric 5 x 5 power.  Data Reviewed: I have personally reviewed following labs and imaging studies  CBC:  Recent Labs Lab 11/12/15 1205 11/13/15 0410 11/14/15 0433 11/15/15 0606 11/16/15 0525 11/17/15 0704  WBC 8.4 9.6 17.5* 14.4* 9.7 9.4  NEUTROABS 6.4  --   --   --   --   --   HGB 13.2 11.2* 12.3 13.2 13.2 12.9  HCT 40.9 35.6* 39.4 41.5 41.1 40.0  MCV 92.1 92.2 91.6 91.2 90.7 91.3  PLT 160 147* 173 173 151 [id number]*   Basic Metabolic Panel:  Recent Labs Lab 11/13/15 0410 11/14/15 0433 11/15/15 0606 11/16/15 0525 11/17/15 0704  NA 141 142 144 144 144  K 3.4* 3.8 3.4* 3.1* 4.4  CL 112* 110 108 108 109  CO2 21* 25 27 26 26   GLUCOSE 217* 170* 127* 130* 152*  BUN 19 23* 37* 42* 40*  CREATININE 1.00 1.08* 1.01* 1.09* 0.93  CALCIUM 7.9* 9.2 9.4 9.4 9.3   Liver Function Tests:  Recent Labs Lab 11/12/15 1205  AST 28  ALT 19  ALKPHOS 94  BILITOT 0.5  PROT 6.2*  ALBUMIN 3.1*   Radiology Studies: Dg Esophagus Result Date: 11/14/2015 CLINICAL DATA:  Technically difficult exam due to patient's frail physical condition and inability to perform rapid bolus swallowing. Mild vestibular penetration, without evidence of aspiration. No evidence of esophageal mass, stricture, or obstruction. Extrinsic impression on posterior cervical esophagus due to prominent cervical spine osteophytosis. Mild esophageal dysmotility.   Dg Chest Portable 1 View Result Date: 11/12/2015 CLINICAL DATA: :COPD.  No pneumonia nor CHF. Aortic atherosclerosis.     Scheduled Meds: . amLODipine  10 mg Oral Daily  . amoxicillin-clavulanate  1 tablet Oral BID  . aspirin  81 mg Oral Daily  . dextromethorphan-guaiFENesin  1 tablet Oral BID  . doxycycline (VIBRAMYCIN) IV  100 mg Intravenous Q12H  . fluconazole  100 mg Oral Daily  . heparin  5,000 Units Subcutaneous Q8H  . Influenza vac split quadrivalent PF  0.5 mL Intramuscular Tomorrow-1000  . insulin aspart  0-9 Units  Subcutaneous TID WC  . isosorbide mononitrate  30 mg Oral Daily  . levalbuterol  1.25 mg Nebulization QID  . methylPREDNISolone (SOLU-MEDROL) injection  60 mg Intravenous Q12H  . metoprolol succinate  25 mg Oral Daily  . polyethylene glycol  17 g Oral Daily  . polyvinyl alcohol  1 drop Both Eyes QID  . protein supplement shake  11 oz Oral TID BM  . tiotropium  18 mcg Inhalation Daily   Continuous Infusions:    LOS: 4 days   Time spent: 20 minutes   Faye Ramsay, MD [facility] Pager [phone]  If 7PM-7AM, please contact night-coverage [email or web address]  Password [email or web address] 11/17/2015, 11:56 AM

## 2015-11-17 NOTE — Care Management Important Message (Signed)
Important Message  Patient Details  Name: [name] [name] MRN: [id number] Date of Birth: [birth year]   Medicare Important Message Given:  Yes    [name] 11/17/2015, 2:07 PM

## 2015-11-18 LAB — CBC
HEMATOCRIT: 40 % (ref 36.0–46.0)
HEMOGLOBIN: 12.8 g/dL (ref 12.0–15.0)
MCH: 29.4 pg (ref 26.0–34.0)
MCHC: 32 g/dL (ref 30.0–36.0)
MCV: 92 fL (ref 78.0–100.0)
Platelets: 133 10*3/uL — ABNORMAL LOW (ref 150–400)
RBC: 4.35 MIL/uL (ref 3.87–5.11)
RDW: 15.5 % (ref 11.5–15.5)
WBC: 11.5 10*3/uL — ABNORMAL HIGH (ref 4.0–10.5)

## 2015-11-18 LAB — BASIC METABOLIC PANEL
Anion gap: 7 (ref 5–15)
BUN: 46 mg/dL — AB (ref 6–20)
CHLORIDE: 106 mmol/L (ref 101–111)
CO2: 30 mmol/L (ref 22–32)
Calcium: 9.2 mg/dL (ref 8.9–10.3)
Creatinine, Ser: 0.88 mg/dL (ref 0.44–1.00)
GFR calc Af Amer: >60 mL/min (ref >60)
GFR calc non Af Amer: 58 mL/min — ABNORMAL LOW (ref >60)
GLUCOSE: 93 mg/dL (ref 65–99)
POTASSIUM: 4.3 mmol/L (ref 3.5–5.1)
SODIUM: 143 mmol/L (ref 135–145)

## 2015-11-18 LAB — GLUCOSE, CAPILLARY
GLUCOSE-CAPILLARY: 89 mg/dL (ref 65–99)
Glucose-Capillary: 84 mg/dL (ref 65–99)

## 2015-11-18 MED ORDER — FUROSEMIDE 20 MG PO TABS: 20.0000 mg | ORAL_TABLET | Freq: Every day | ORAL | 0 refills | Status: DC

## 2015-11-18 MED ORDER — FUROSEMIDE 10 MG/ML IJ SOLN
40.0000 mg | Freq: Once | INTRAMUSCULAR | Status: AC
  Administered 2015-11-18: 40 mg via INTRAVENOUS
  Filled 2015-11-18: qty 4

## 2015-11-18 MED ORDER — PNEUMOCOCCAL 13-VAL CONJ VACC IM SUSP
0.5000 mL | Freq: Once | INTRAMUSCULAR | Status: AC
  Administered 2015-11-18: 0.5 mL via INTRAMUSCULAR
  Filled 2015-11-18: qty 0.5

## 2015-11-18 MED ORDER — AMOXICILLIN-POT CLAVULANATE 500-125 MG PO TABS: 1.0000 | ORAL_TABLET | Freq: Two times a day (BID) | ORAL | 0 refills | Status: DC

## 2015-11-18 MED ORDER — LEVALBUTEROL HCL 1.25 MG/0.5ML IN NEBU: 1.2500 mg | INHALATION_SOLUTION | RESPIRATORY_TRACT | 3 refills | Status: DC | PRN

## 2015-11-18 MED ORDER — FLUCONAZOLE 100 MG PO TABS: 100.0000 mg | ORAL_TABLET | Freq: Every day | ORAL | 0 refills | Status: DC

## 2015-11-18 MED ORDER — PREDNISONE 10 MG PO TABS: ORAL_TABLET | ORAL | 0 refills | Status: DC

## 2015-11-18 NOTE — Progress Notes (Signed)
Patient d/c to home: IV discontinued, vaccination given, patient educated on new medications, prescriptions provided, patient confirmed she has all belongings, follow up appointments scheduled and reviewed. Patient left floor via wheelchair with tech.

## 2015-11-18 NOTE — Care Management Note (Signed)
Case Management Note  Patient Details  Name: [name] [name] MRN: [id number] Date of Birth: 26-Aug-1927  Subjective/Objective:        CM continuing to follow for progression and d/c planning.             Action/Plan: 11/18/2015 Plan to d/c pt to home today. [facility] notified of plan to d/c today and HHRN, HHPT and [place] aide. Nebulizer ordered for home use. [facility] to deliver to room.  Expected Discharge Date:  11/13/15               Expected Discharge Plan:  [place]  In-House Referral:  NA  Discharge planning Services  CM Consult  Post Acute Care Choice:  Home Health Choice offered to:  Patient  DME Arranged:   nebulizer DME Agency:   [facility]  HH Arranged:  RN, PT [place] Agency:  [place]  Status of Service:  Completed, signed off  If discussed at [place] of Stay Meetings, dates discussed:    Additional Comments:  Adron Bene, RN 11/18/2015, 2:05 PM

## 2015-11-18 NOTE — Discharge Instructions (Signed)
Chronic Obstructive Pulmonary Disease Chronic obstructive pulmonary disease (COPD) is a common lung condition in which airflow from the lungs is limited. COPD is a general term that can be used to describe many different lung problems that limit airflow, including both chronic bronchitis and emphysema. If you have COPD, your lung function will probably never return to normal, but there are measures you can take to improve lung function and make yourself feel better. CAUSES   Smoking (common).  Exposure to secondhand smoke.  Genetic problems.  Chronic inflammatory lung diseases or recurrent infections. SYMPTOMS  Shortness of breath, especially with physical activity.  Deep, persistent (chronic) cough with a large amount of thick mucus.  Wheezing.  Rapid breaths (tachypnea).  Gray or bluish discoloration (cyanosis) of the skin, especially in your fingers, toes, or lips.  Fatigue.  Weight loss.  Frequent infections or episodes when breathing symptoms become much worse (exacerbations).  Chest tightness. DIAGNOSIS Your health care provider will take a medical history and perform a physical examination to diagnose COPD. Additional tests for COPD may include:  Lung (pulmonary) function tests.  Chest X-ray.  CT scan.  Blood tests. TREATMENT  Treatment for COPD may include:  Inhaler and nebulizer medicines. These help manage the symptoms of COPD and make your breathing more comfortable.  Supplemental oxygen. Supplemental oxygen is only helpful if you have a low oxygen level in your blood.  Exercise and physical activity. These are beneficial for nearly all people with COPD.  Lung surgery or transplant.  Nutrition therapy to gain weight, if you are underweight.  Pulmonary rehabilitation. This may involve working with a team of health care providers and specialists, such as respiratory, occupational, and physical therapists. HOME CARE INSTRUCTIONS  Take all medicines  (inhaled or pills) as directed by your health care provider.  Avoid over-the-counter medicines or cough syrups that dry up your airway (such as antihistamines) and slow down the elimination of secretions unless instructed otherwise by your health care provider.  If you are a smoker, the most important thing that you can do is stop smoking. Continuing to smoke will cause further lung damage and breathing trouble. Ask your health care provider for help with quitting smoking. He or she can direct you to community resources or hospitals that provide support.  Avoid exposure to irritants such as smoke, chemicals, and fumes that aggravate your breathing.  Use oxygen therapy and pulmonary rehabilitation if directed by your health care provider. If you require home oxygen therapy, ask your health care provider whether you should purchase a pulse oximeter to measure your oxygen level at home.  Avoid contact with individuals who have a contagious illness.  Avoid extreme temperature and humidity changes.  Eat healthy foods. Eating smaller, more frequent meals and resting before meals may help you maintain your strength.  Stay active, but balance activity with periods of rest. Exercise and physical activity will help you maintain your ability to do things you want to do.  Preventing infection and hospitalization is very important when you have COPD. Make sure to receive all the vaccines your health care provider recommends, especially the pneumococcal and influenza vaccines. Ask your health care provider whether you need a pneumonia vaccine.  Learn and use relaxation techniques to manage stress.  Learn and use controlled breathing techniques as directed by your health care provider. Controlled breathing techniques include:  Pursed lip breathing. Start by breathing in (inhaling) through your nose for 1 second. Then, purse your lips as if you were   going to whistle and breathe out (exhale) through the  pursed lips for 2 seconds.  Diaphragmatic breathing. Start by putting one hand on your abdomen just above your waist. Inhale slowly through your nose. The hand on your abdomen should move out. Then purse your lips and exhale slowly. You should be able to feel the hand on your abdomen moving in as you exhale.  Learn and use controlled coughing to clear mucus from your lungs. Controlled coughing is a series of short, progressive coughs. The steps of controlled coughing are: 1. Lean your head slightly forward. 2. Breathe in deeply using diaphragmatic breathing. 3. Try to hold your breath for 3 seconds. 4. Keep your mouth slightly open while coughing twice. 5. Spit any mucus out into a tissue. 6. Rest and repeat the steps once or twice as needed. SEEK MEDICAL CARE IF:  You are coughing up more mucus than usual.  There is a change in the color or thickness of your mucus.  Your breathing is more labored than usual.  Your breathing is faster than usual. SEEK IMMEDIATE MEDICAL CARE IF:  You have shortness of breath while you are resting.  You have shortness of breath that prevents you from:  Being able to talk.  Performing your usual physical activities.  You have chest pain lasting longer than 5 minutes.  Your skin color is more cyanotic than usual.  You measure low oxygen saturations for longer than 5 minutes with a pulse oximeter. MAKE SURE YOU:  Understand these instructions.  Will watch your condition.  Will get help right away if you are not doing well or get worse.   This information is not intended to replace advice given to you by your health care provider. Make sure you discuss any questions you have with your health care provider.   Document Released: 10/28/2004 Document Revised: 02/08/2014 Document Reviewed: 09/14/2012 Elsevier Interactive Patient Education 2016 [employer]  

## 2015-11-18 NOTE — Discharge Summary (Addendum)
Physician Discharge Summary  [name] [name] [id number] DOB: [birth year] DOA: 11/12/2015  PCP: Lottie Dawson, MD  Admit date: 11/12/2015 Discharge date: 11/18/2015  Recommendations for Outpatient Follow-up:  1. Pt will need to follow up with PCP in 1-2 weeks post discharge 2. Please obtain BMP to evaluate electrolytes and kidney function 3. Please also check CBC to evaluate Hg and Hct levels 4. Pt was instructed to take Lasix and to monitor her weight closely, she was told her weight today is 88 lbs 5. Pt has an appointment scheduled with [facility] cardiology Kathlen Mody for follow up  6. Pt also advised to complete therapy with ABX and prednisone taper   Discharge Diagnoses:  Principal Problem:   COPD exacerbation ([place]) Active Problems:   Essential hypertension, benign  Discharge Condition: Stable  Diet recommendation: Heart healthy diet discussed in details   Brief Narrative:  81 y.o.femalewith medical history significant of COPD, tobacco abuse, essential HTN, CAD, skin cancer LLE (2 weeks ago with skin graft), who had sudden onset of shortness of breath this morning. She states that her mobility has been limited after her skin cancer biopsy and skin graft procedure 2 weeks ago. She admits to nonproductive cough, but no fevers or chills. She denies any chest pain, no peripheral edema, no nausea or vomiting, no abdominal pain.   Assessment & Plan:   Principal Problem:   Acute hypoxic respiratory failure secondary to acute COPD exacerbation (HCC), aspiration pneumonia present on admission, acute diastolic CHF present on admission  - breath sounds are more clear this AM but still with mild rhonchi at bases  - pt complete treatment with doxycycline but needs to complete therapy with Augmentin as noted below  - continue Spiriva - CT chest notable for emphysema bilateral endobronchial disease, narrowing and occlusion of multiple airways, ? related to mucus and aspiration,  bilateral pleural effusions with bibasilar atelectasis, centrilobular emphysema - added lasix as well to see if this will help, on 10/13 given lasix 20 mg IV but since not much improvement, increased Lasix to 40 mg IV QD 10/14, weight is down by 2 lbs and pt does sound better on lung exam, will continue low dose lasix upon discharge  - ECHO done EF, stable but grade I diastolic CHF noted  - taper down prednisone as outlined below - follow up with cardiology [facility] as outlined below   Active Problems:   Essential hypertension, benign - continue Norvasc, Metoprolol, Imdur     TOBACCO USE - consultation provided    Hypokalemia - supplemented     Dysphagia - esophagogram technically difficult exam due to patient's frail physical condition and inability to perform rapid bolus swallowing.  - noted mild vestibular penetration, without evidence of aspiration, no evidence of esophageal mass, stricture, or obstruction - Extrinsic impression on posterior cervical esophagus due to prominent cervical spine osteophytosis. Mild esophageal dysmotility    Leukocytosis - suspect from steroids, WBC trending down and now WNL    Underweight, severe PCM  - Body mass index is 15.25 kg/m.  DVT prophylaxis: Heparin SQ Code Status: Full  Family Communication: Patient at bedside, daughter over the phone  Disposition Plan: Home   Consultants:   None  Procedures:   None  Antimicrobials:   Doxycycline 10/11 --> 10/17  Augmentin 10/14 -->  Procedures/Studies: Ct Chest Wo Contrast  Result Date: 11/14/2015 CLINICAL DATA:  Dyspnea and cough for 3 days.  COPD. EXAM: CT CHEST WITHOUT CONTRAST TECHNIQUE: Multidetector CT imaging of the chest was  performed following the standard protocol without IV contrast. COMPARISON:  05/26/2011 FINDINGS: Cardiovascular: Ascending thoracic aorta measures 3.8 cm. Calcifications throughout the thoracic aorta. Coronary artery calcifications. Mediastinum/Nodes:  Surgical clips in the right axilla. No significant chest lymphadenopathy. No significant pericardial fluid. Esophagus is unremarkable. Mild heterogeneity in the thyroid tissue. Lungs/Pleura: Trachea is patent. Again noted is extensive scarring at the lung apices which is similar to the previous examination. There appears to be wall thickening involving the mainstem bronchi and proximal lobar airways. There is severe narrowing at the origin of the left lower lobar bronchus on sequence 3, image 70. Areas of circumferential narrowing the left upper lobe airways on sequence 3, image 71 and 60. There is complete opacification or occlusion of the presumed right middle lobe bronchus on sequence 3, image 78. Lobar and segmental wall thickening involving the right upper lobe, best seen on image number 51. Occlusion or opacification in multiple of the small airways in the left lower lobe. In addition, there is mild centrilobular emphysema. Chronic thickening and irregularity along the anterior pleura in the right upper lobe. Patient does not have a distinct right middle lobe. There appears to be very small right minor fissure. Small right pleural effusion with compressive atelectasis. Tiny left pleural effusion. Small dependent pleural-based densities in left lower lobe are probably related to atelectasis. There is a stable pleural-based nodule along the posterior left upper lobe on sequence 3, image 23 that measures 3 mm. Upper Abdomen: High-density contrast in the stomach. Slightly dense structure in left kidney measures 7 mm and could represent a hyperdense or proteinaceous cyst. This is probably an incidental finding. There is a left renal artery stent. Musculoskeletal: No acute abnormality. IMPRESSION: Bilateral endobronchial disease. Narrowing and occlusion of multiple airways. Findings could be related to mucus and aspiration. Underlying bronchial inflammation but cannot be excluded. Small bilateral pleural effusions  with bibasilar atelectasis. Mild centrilobular emphysema. Coronary artery calcifications. Indeterminate 7 mm hyperdense structure in the left kidney. This probably represents a small proteinaceous or hemorrhagic cyst. Electronically Signed   By: Markus Daft M.D.   On: 11/14/2015 13:33   Dg Esophagus  Result Date: 11/14/2015 CLINICAL DATA:  Dysphagia with solid food sticking in throat and chest. EXAM: ESOPHOGRAM/BARIUM SWALLOW TECHNIQUE: Single contrast examination was performed using  thin barium. FLUOROSCOPY TIME:  Fluoroscopy Time: 3 minutes 24 seconds ; low-dose pulsed fluoroscopy Radiation Exposure Index (if provided by the fluoroscopic device): 9.1 mGy Number of Acquired Spot Images: 0 COMPARISON:  None. FINDINGS: Exam was technically difficult due to patient's frail physical condition and inability to perform rapid swallowing. Mild vestibular penetration was seen during swallowing, however there is no evidence of aspiration. Extrinsic impression is seen on the posterior cervical esophagus due to prominent cervical spine osteophytosis at levels of C5-6 and C6-7. This does not result in obstruction to passage of contrast. Esophagus was incompletely distended due to patient's inability to perform rapid swallowing, however No no esophageal mass or stricture identified. No definite findings of esophagitis noted. Mild esophageal dysmotility noted with breakup of primary peristalsis. No evidence of hiatal hernia or gastroesophgeal reflux. IMPRESSION: Technically difficult exam due to patient's frail physical condition and inability to perform rapid bolus swallowing. Mild vestibular penetration, without evidence of aspiration. No evidence of esophageal mass, stricture, or obstruction. Extrinsic impression on posterior cervical esophagus due to prominent cervical spine osteophytosis. Mild esophageal dysmotility. Electronically Signed   By: Earle Gell M.D.   On: 11/14/2015 09:32   Dg Chest Portable  1  View  Result Date: 11/12/2015 CLINICAL DATA:  Shortness of breath. History of COPD, coronary artery disease with stent placement, former heavy smoker. EXAM: PORTABLE CHEST 1 VIEW COMPARISON:  PA and lateral chest x-ray of July 25, 2015 FINDINGS: The lungs remain hyperinflated. There is no pneumothorax pleural effusion, or alveolar infiltrate. Minimal density blunts the right lateral costophrenic angle but this is stable. The heart and pulmonary vascularity are normal. There is calcification within the wall of the aortic arch. The bony thorax exhibits no acute abnormality. There is chronic dextrocurvature of the thoracolumbar spine. There are surgical clips in the right axillary region. IMPRESSION: COPD.  No pneumonia nor CHF. Aortic atherosclerosis. Electronically Signed   By: David  Martinique M.D.   On: 11/12/2015 12:25     Discharge Exam: Vitals:   11/18/15 0602 11/18/15 0934  BP: (!) 163/59 (!) 146/59  Pulse: (!) 120 69  Resp: 18 16  Temp: 98.5 F (36.9 C) 98.2 F (36.8 C)   Vitals:   11/18/15 0602 11/18/15 0752 11/18/15 0755 11/18/15 0934  BP: (!) 163/59   (!) 146/59  Pulse: (!) 120   69  Resp: 18   16  Temp: 98.5 F (36.9 C)   98.2 F (36.8 C)  TempSrc: Oral   Oral  SpO2: 97% 95% 95% 95%  Weight:      Height:        General: Pt is alert, follows commands appropriately, not in acute distress Cardiovascular: Regular rate and rhythm, S1/S2 +, no murmurs, no rubs, no gallops Respiratory: Minimal rhonchi at bases  Abdominal: Soft, non tender, non distended, bowel sounds +, no guarding Extremities: no edema, no cyanosis, pulses palpable bilaterally DP and PT Neuro: Grossly nonfocal  Discharge Instructions  Discharge Instructions    Diet - low sodium heart healthy    Complete by:  As directed    Increase activity slowly    Complete by:  As directed        Medication List    TAKE these medications   amLODipine 10 MG tablet Commonly known as:  NORVASC Take 1 tablet (10  mg total) by mouth daily.   amoxicillin-clavulanate 500-125 MG tablet Commonly known as:  AUGMENTIN Take 1 tablet (500 mg total) by mouth 2 (two) times daily.   aspirin 81 MG chewable tablet Chew 81 mg by mouth daily.   carboxymethylcellulose 0.5 % Soln Commonly known as:  REFRESH PLUS Place 1 drop into both eyes 4 (four) times daily.   fish oil-omega-3 fatty acids 1000 MG capsule Take 1 g by mouth daily.   fluconazole 100 MG tablet Commonly known as:  DIFLUCAN Take 1 tablet (100 mg total) by mouth daily.   furosemide 20 MG tablet Commonly known as:  LASIX Take 1 tablet (20 mg total) by mouth daily.   isosorbide mononitrate 30 MG 24 hr tablet Commonly known as:  IMDUR Take 1 tablet (30 mg total) by mouth daily.   levalbuterol 1.25 MG/0.5ML nebulizer solution Commonly known as:  XOPENEX Take 1.25 mg by nebulization every 4 (four) hours as needed for wheezing or shortness of breath.   levalbuterol 45 MCG/ACT inhaler Commonly known as:  XOPENEX HFA INHALE 2 PUFFS BY MOUTH EVERY 6 HOURS AS NEEDED FOR WHEEZING   metoprolol succinate 25 MG 24 hr tablet Commonly known as:  TOPROL-XL Take 1 tablet (25 mg total) by mouth daily.   nitroGLYCERIN 0.4 MG SL tablet Commonly known as:  NITROSTAT Place 1 tablet (0.4 mg total)  under the tongue every 5 (five) minutes as needed for chest pain.   predniSONE 10 MG tablet Commonly known as:  DELTASONE Take 50 mg tablet 10/18 and taper down by 10 mg daily until competed   tiotropium 18 MCG inhalation capsule Commonly known as:  SPIRIVA HANDIHALER INHALE CONTENTS OF 1 CAPSULE ONCE DAILY USING HANDIHALER   traMADol 50 MG tablet Commonly known as:  ULTRAM TAKE ONE TABLET BY MOUTH EVERY EIGHT HOURS AS NEEDED FOR PAIN   Vitamin D 1000 units capsule Take 1,000 Units by mouth daily.      Follow-up Information    Faye Ramsay, MD .   Specialty:  Internal Medicine Why:  call me as needed [phone] Dr. Mellody Memos  information: [address] [place] [address] [phone]            The results of significant diagnostics from this hospitalization (including imaging, microbiology, ancillary and laboratory) are listed below for reference.     Microbiology: No results found for this or any previous visit (from the past 240 hour(s)).   Labs: Basic Metabolic Panel:  Recent Labs Lab 11/14/15 0433 11/15/15 0606 11/16/15 0525 11/17/15 0704 11/18/15 0353  NA 142 144 144 144 143  K 3.8 3.4* 3.1* 4.4 4.3  CL 110 108 108 109 106  CO2 25 27 26 26 30   GLUCOSE 170* 127* 130* 152* 93  BUN 23* 37* 42* 40* 46*  CREATININE 1.08* 1.01* 1.09* 0.93 0.88  CALCIUM 9.2 9.4 9.4 9.3 9.2   Liver Function Tests:  Recent Labs Lab 11/12/15 1205  AST 28  ALT 19  ALKPHOS 94  BILITOT 0.5  PROT 6.2*  ALBUMIN 3.1*   CBC:  Recent Labs Lab 11/12/15 1205  11/14/15 0433 11/15/15 0606 11/16/15 0525 11/17/15 0704 11/18/15 0353  WBC 8.4  < > 17.5* 14.4* 9.7 9.4 11.5*  NEUTROABS 6.4  --   --   --   --   --   --   HGB 13.2  < > 12.3 13.2 13.2 12.9 12.8  HCT 40.9  < > 39.4 41.5 41.1 40.0 40.0  MCV 92.1  < > 91.6 91.2 90.7 91.3 92.0  PLT 160  < > 173 173 151 120* 133*  < > = values in this interval not displayed.  BNP (last 3 results)  Recent Labs  11/12/15 1205  BNP 251.9*   CBG:  Recent Labs Lab 11/17/15 0752 11/17/15 1153 11/17/15 1727 11/17/15 2202 11/18/15 0826  GLUCAP 131* 164* 115* 110* 89   SIGNED: Time coordinating discharge: 30 minutes  MAGICK-Elianis Fischbach, MD  [facility] 11/18/2015, 11:13 AM Pager [phone]  If 7PM-7AM, please contact night-coverage [email or web address] Password [email or web address]

## 2015-11-19 ENCOUNTER — Telehealth: Payer: Self-pay

## 2015-11-19 ENCOUNTER — Encounter: Payer: Self-pay | Admitting: Physician Assistant

## 2015-11-19 ENCOUNTER — Telehealth: Payer: Self-pay | Admitting: Internal Medicine

## 2015-11-19 DIAGNOSIS — J449 Chronic obstructive pulmonary disease, unspecified: Secondary | ICD-10-CM | POA: Diagnosis not present

## 2015-11-19 DIAGNOSIS — C44702 Unspecified malignant neoplasm of skin of right lower limb, including hip: Secondary | ICD-10-CM

## 2015-11-19 DIAGNOSIS — J690 Pneumonitis due to inhalation of food and vomit: Secondary | ICD-10-CM | POA: Diagnosis not present

## 2015-11-19 DIAGNOSIS — M6281 Muscle weakness (generalized): Secondary | ICD-10-CM | POA: Diagnosis not present

## 2015-11-19 DIAGNOSIS — I5032 Chronic diastolic (congestive) heart failure: Secondary | ICD-10-CM | POA: Diagnosis not present

## 2015-11-19 DIAGNOSIS — R2689 Other abnormalities of gait and mobility: Secondary | ICD-10-CM

## 2015-11-19 DIAGNOSIS — E440 Moderate protein-calorie malnutrition: Secondary | ICD-10-CM

## 2015-11-19 DIAGNOSIS — Z9114 Patient's other noncompliance with medication regimen: Secondary | ICD-10-CM | POA: Diagnosis not present

## 2015-11-19 NOTE — Telephone Encounter (Signed)
[name] [name] is needing verbal orders for home health visits twice a wk for 2wk and once a wk for 2 wks, also needs PT and OT evaluation. Pt had surgeon wound to  Left leg from skin graft that derm will be following.

## 2015-11-19 NOTE — Telephone Encounter (Signed)
Transition Care Management Follow-up Telephone Call  How have you been since you were released from the hospital? Pt reports that she is tired today. Pt had visit today from [facility] for nursing and therapy.    Do you understand why you were in the hospital? yes   Do you understand the discharge instructions? yes  Items Reviewed:  Medications reviewed: yes, but pt did not get the 3 rx's she was give at discharge, pt states that she was not given the paper rx's so she needs rx's sent to pharmacy   Allergies reviewed: yes  Dietary changes reviewed: no  Referrals reviewed: no   Functional Questionnaire:   Activities of Daily Living (ADLs):   She states they are independent in the following: ambulation, grooming and dressing States they require assistance with the following: ambulation, grooming and dressing   Any transportation issues/concerns?: yes, pt states that she cannot drive and needs someone to bring her to any appts   Any patient concerns? no   Confirmed importance and date/time of follow-up visits scheduled: yes   Confirmed with patient if condition begins to worsen call PCP or go to the ER.  Patient was given the [place] line [phone]: yes   MISTY - pt needs did not want to schedule hospital f/u at this time, she states that due to her leg wound she is having trouble getting around and is not able to get out of the house. Please call her Monday to schedule hosp f/u appt. Thanks!

## 2015-11-19 NOTE — Telephone Encounter (Signed)
Pt does not have a hospital follow up.  Send to pulm?

## 2015-11-20 ENCOUNTER — Telehealth: Payer: Self-pay | Admitting: Internal Medicine

## 2015-11-20 DIAGNOSIS — M6281 Muscle weakness (generalized): Secondary | ICD-10-CM | POA: Diagnosis not present

## 2015-11-20 DIAGNOSIS — J449 Chronic obstructive pulmonary disease, unspecified: Secondary | ICD-10-CM | POA: Diagnosis not present

## 2015-11-20 DIAGNOSIS — I5032 Chronic diastolic (congestive) heart failure: Secondary | ICD-10-CM | POA: Diagnosis not present

## 2015-11-20 DIAGNOSIS — J690 Pneumonitis due to inhalation of food and vomit: Secondary | ICD-10-CM | POA: Diagnosis not present

## 2015-11-20 DIAGNOSIS — Z9114 Patient's other noncompliance with medication regimen: Secondary | ICD-10-CM | POA: Diagnosis not present

## 2015-11-20 NOTE — Telephone Encounter (Signed)
PT would like to have verbal order to see pt 1x this week and 2x for 4 weeks

## 2015-11-21 ENCOUNTER — Telehealth: Payer: Self-pay | Admitting: Internal Medicine

## 2015-11-21 DIAGNOSIS — M6281 Muscle weakness (generalized): Secondary | ICD-10-CM | POA: Diagnosis not present

## 2015-11-21 DIAGNOSIS — J690 Pneumonitis due to inhalation of food and vomit: Secondary | ICD-10-CM | POA: Diagnosis not present

## 2015-11-21 DIAGNOSIS — I5032 Chronic diastolic (congestive) heart failure: Secondary | ICD-10-CM | POA: Diagnosis not present

## 2015-11-21 DIAGNOSIS — Z9114 Patient's other noncompliance with medication regimen: Secondary | ICD-10-CM | POA: Diagnosis not present

## 2015-11-21 DIAGNOSIS — J449 Chronic obstructive pulmonary disease, unspecified: Secondary | ICD-10-CM | POA: Diagnosis not present

## 2015-11-21 NOTE — Telephone Encounter (Signed)
I cannot tell what  Medications  she needs   See other messages   Needs OV to figure all of this out.    Can ask cards or pulmonary  .who will be seeing her to  Advise.

## 2015-11-21 NOTE — Telephone Encounter (Signed)
Okay for a verbal order?

## 2015-11-21 NOTE — Telephone Encounter (Signed)
Verbal orders for medical social consult not eating well [name] [name] states that looks though the pt does not have a lot of food he will be looking into getting her on Meals on Wheels), pt forgetting to take medication or taking too much pt thinks that she took 2 doses of Lasix, talking with step-daughter to change living arrangement.  [name] [name] state that the pt state that she has lost 30lbs within a month.  Hospitalization for pneumonia and COPD aspiration.

## 2015-11-21 NOTE — Telephone Encounter (Signed)
Please have her make OV  30 minutes  Preferred with fam member  To be able to help out with care and proper orders    I have not seen her Face to face since APril  And orders need to come from a recent face to face  Encounter   By ordering clinician .

## 2015-11-21 NOTE — Telephone Encounter (Signed)
Okay for verbal orders. 

## 2015-11-21 NOTE — Telephone Encounter (Signed)
Please call an get pt scheduled for a 30 min OV.

## 2015-11-21 NOTE — Telephone Encounter (Signed)
Cannot do order for PT until seen    No face to face since April and not on the schedule  . ( cardiology sees her next week and can opine on this  Or  Pulmonary if they  wants to do these orders )

## 2015-11-21 NOTE — Telephone Encounter (Signed)
Please call pt and get pt scheduled for appointment.

## 2015-11-24 DIAGNOSIS — J690 Pneumonitis due to inhalation of food and vomit: Secondary | ICD-10-CM | POA: Diagnosis not present

## 2015-11-24 DIAGNOSIS — M6281 Muscle weakness (generalized): Secondary | ICD-10-CM | POA: Diagnosis not present

## 2015-11-24 DIAGNOSIS — I5032 Chronic diastolic (congestive) heart failure: Secondary | ICD-10-CM | POA: Diagnosis not present

## 2015-11-24 DIAGNOSIS — J449 Chronic obstructive pulmonary disease, unspecified: Secondary | ICD-10-CM | POA: Diagnosis not present

## 2015-11-24 DIAGNOSIS — Z9114 Patient's other noncompliance with medication regimen: Secondary | ICD-10-CM | POA: Diagnosis not present

## 2015-11-25 DIAGNOSIS — J449 Chronic obstructive pulmonary disease, unspecified: Secondary | ICD-10-CM | POA: Diagnosis not present

## 2015-11-25 DIAGNOSIS — Z9114 Patient's other noncompliance with medication regimen: Secondary | ICD-10-CM | POA: Diagnosis not present

## 2015-11-25 DIAGNOSIS — J690 Pneumonitis due to inhalation of food and vomit: Secondary | ICD-10-CM | POA: Diagnosis not present

## 2015-11-25 DIAGNOSIS — M6281 Muscle weakness (generalized): Secondary | ICD-10-CM | POA: Diagnosis not present

## 2015-11-25 DIAGNOSIS — I5032 Chronic diastolic (congestive) heart failure: Secondary | ICD-10-CM | POA: Diagnosis not present

## 2015-11-25 NOTE — Telephone Encounter (Signed)
Per DrPanosh she would prefer Pulmonary to handle PT and OT orders as pt was hospitalized for COPD flare.   LM for Evelena Peat with this information. Nothing further needed.

## 2015-11-25 NOTE — Telephone Encounter (Signed)
Spoke with pt and advised that she needs hospital f/u scheduled. She agreed to schedule appt with DrPanosh 11/27/15 @ 10:45am. Pt aware. Nothing further needed.

## 2015-11-26 ENCOUNTER — Ambulatory Visit (INDEPENDENT_AMBULATORY_CARE_PROVIDER_SITE_OTHER): Payer: Medicare HMO | Admitting: Physician Assistant

## 2015-11-26 ENCOUNTER — Encounter: Payer: Self-pay | Admitting: Physician Assistant

## 2015-11-26 VITALS — BP 124/60 | HR 72 | Ht 64.0 in | Wt 92.8 lb

## 2015-11-26 DIAGNOSIS — I779 Disorder of arteries and arterioles, unspecified: Secondary | ICD-10-CM

## 2015-11-26 DIAGNOSIS — I10 Essential (primary) hypertension: Secondary | ICD-10-CM | POA: Diagnosis not present

## 2015-11-26 DIAGNOSIS — I5032 Chronic diastolic (congestive) heart failure: Secondary | ICD-10-CM | POA: Diagnosis not present

## 2015-11-26 DIAGNOSIS — E7800 Pure hypercholesterolemia, unspecified: Secondary | ICD-10-CM

## 2015-11-26 DIAGNOSIS — I2510 Atherosclerotic heart disease of native coronary artery without angina pectoris: Secondary | ICD-10-CM

## 2015-11-26 DIAGNOSIS — I739 Peripheral vascular disease, unspecified: Secondary | ICD-10-CM

## 2015-11-26 DIAGNOSIS — Z720 Tobacco use: Secondary | ICD-10-CM

## 2015-11-26 LAB — BASIC METABOLIC PANEL
BUN: 45 mg/dL — AB (ref 7–25)
CHLORIDE: 104 mmol/L (ref 98–110)
CO2: 20 mmol/L (ref 20–31)
CREATININE: 1.34 mg/dL — AB (ref 0.60–0.88)
Calcium: 9.1 mg/dL (ref 8.6–10.4)
Glucose, Bld: 78 mg/dL (ref 65–99)
POTASSIUM: 5.4 mmol/L — AB (ref 3.5–5.3)
Sodium: 139 mmol/L (ref 135–146)

## 2015-11-26 MED ORDER — FUROSEMIDE 20 MG PO TABS: 20.0000 mg | ORAL_TABLET | ORAL | Status: DC | PRN

## 2015-11-26 NOTE — Patient Instructions (Addendum)
Medication Instructions:  CHANGE your Lasix (Furosemide) to 20 mg once daily as needed  If your weight increases 3 lbs or more in 1 day (24 hours) or you notice that your legs are swollen, you should take Lasix that day. If your breathing seems to be worse and your weight has increased from one day to the next, you should take Lasix that day.  Labwork: Today - BMET  Testing/Procedures: Your physician has requested that you have a carotid duplex. This test is an ultrasound of the carotid arteries in your neck. It looks at blood flow through these arteries that supply the brain with blood. Allow one hour for this exam. There are no restrictions or special instructions.  Follow-Up: Dr. Kirk Ruths in 6-8 weeks.  Any Other Special Instructions Will Be Listed Below (If Applicable). If you need a refill on your cardiac medications before your next appointment, please call your pharmacy.

## 2015-11-26 NOTE — Progress Notes (Signed)
Pre visit review using our clinic review tool, if applicable. No additional management support is needed unless otherwise documented below in the visit note.  Chief Complaint  Patient presents with  . Hospitalization Follow-up    HPI: Patient  [name] [name]  82 y.o. comes in today for fu ttcm visit Care visit hospitalized for  See below summary     CDheart failure  Monitoring  Lab and lasix   pob secondary to  Copd rep failure   Has PVD  To do carotid study soon  Pulm:  resp failure copd   Stopped tobacco since hosp .  Skin surgery graft [facility] surgery  Center  Wound care ? [name] nurse?   CV saw Nicki Reaper weaver yesterday and had blood test  Hard to get  Blood sample from multiple sticks  Not know what yet to do with the  Lasix  Has fu dr Stanford Breed in December .  Nutrition  Doesn't feel like cooking so not that great  Friends gave her some  Ensure     Needs Home health referrals .  To take over   Lives alone   Has form for [employer] trash help cannot handle  The  Large trash receptacle.    Admit date: 11/12/2015 Discharge date: 11/18/2015  Recommendations for Outpatient Follow-up:  1. Pt will need to follow up with PCP in 1-2 weeks post discharge 2. Please obtain BMP to evaluate electrolytes and kidney function 3. Please also check CBC to evaluate Hg and Hct levels 4. Pt was instructed to take Lasix and to monitor her weight closely, she was told her weight today is 88 lbs 5. Pt has an appointment scheduled with [facility] cardiology Kathlen Mody for follow up  6. Pt also advised to complete therapy with ABX and prednisone taper   Discharge Diagnoses:  Principal Problem:   COPD exacerbation ([place]) Active Problems:   Essential hypertension, benign  Discharge Condition: Stable  Diet recommendation: Heart healthy diet discussed in details   Brief Narrative: 82 y.o.femalewith medical history significant of COPD, tobacco abuse, essential HTN, CAD, skin cancer LLE (2 weeks  ago with skin graft), who had sudden onset of shortness of breath this morning. She states that her mobility has been limited after her skin cancer biopsy and skin graft procedure 2 weeks ago. She admits to nonproductive cough, but no fevers or chills. She denies any chest pain, no peripheral edema, no nausea or vomiting, no abdominal pain.  Health Maintenance  Topic Date Due  . ZOSTAVAX  11/27/1989  . MAMMOGRAM  07/10/2016  . TETANUS/TDAP  10/11/2020  . INFLUENZA VACCINE  Completed  . DEXA SCAN  Completed  . PNA vac Low Risk Adult  Completed      ROS:  finifhing antibiotic  augmentin  arhtirits   Right ring finger  Stuck and dec rom no injury GEN/ HEENT: No fever, significant weight changes  Very underweight  No falling  Unsteady  And weaker than perviously  nosweats headaches vision problems hearing changes, CV/ PULM; No chest pain baseline shortness of breath  nocough, syncope,edema  GI /GU: No adominal pain, vomiting, change in bowel habits. No blood in the stool. No significant GU symptoms. SKIN/HEME: ,no acute skin rashes suspicious lesions or bleeding.  Skin graft left leg  No lymphadenopathy, nodules, masses.  NEURO/ PSYCH:  No new neurologic signs such as weakness numbness. IMM/ Allergy: No unusual infections.  Allergy .   REST of 12 system review [place]  except as per HPI   Past Medical History:  Diagnosis Date  . CARCINOMA, BASAL CELL 02/13/2006  . CAROTID ARTERY DISEASE 10/03/2006  . CHRONIC OBSTRUCTIVE PULMONARY DISEASE, ACUTE EXACERBATION 11/17/2009  . Chronic rhinitis 12/13/2006  . CORONARY ARTERY DISEASE 02/13/2006  . DEGENERATIVE JOINT DISEASE 09/27/2006  . DERMATITIS 09/25/2007  . Head trauma    closed  . HYPERLIPIDEMIA 03/10/2008  . HYPERTENSION 02/13/2006  . HYPOTHYROIDISM 01/30/2007  . LIVER FUNCTION TESTS, ABNORMAL 01/22/2009  . LOSS, HEARING NOS 08/25/2006  . Near syncope 09/18/2014  . NEOPLASM, SKIN, UNCERTAIN BEHAVIOR [id number]  . NEPHROLITHIASIS, HX OF 02/27/2010  .  OSTEOARTHRITIS, HAND 12/16/2008  . PERIPHERAL VASCULAR DISEASE 02/13/2006  . Personal history of malignant neoplasm of breast 07/24/2008  . Personal history of urinary calculi   . Post-traumatic wound infection 12/01/2010   Mild streaking superiorly and on one day of septra  Will give rocephin and add keflex  adn follow up   . POSTHERPETIC NEURALGIA 08/25/2006  . RENAL ARTERY STENOSIS 08/01/2008  . Shingles    recurrent  . SPINAL STENOSIS 11/01/2006  . THROMBOCYTOPENIA 07/30/2008  . UNS ADVRS EFF UNS RX MEDICINAL&BIOLOGICAL SBSTNC 01/30/2007  . UNSPECIFIED ANEMIA 03/18/2008  . Unspecified vitamin D deficiency 02/07/2007  . UNSTABLE ANGINA 12/12/2009  . Vitreous detachment     Past Surgical History:  Procedure Laterality Date  . ABDOMINAL HYSTERECTOMY    . BREAST LUMPECTOMY    . BREAST LUMPECTOMY  1998  . CATARACT EXTRACTION    . CORONARY ANGIOPLASTY WITH STENT PLACEMENT     bilat iliac stents, left renal artery and rt coronary artery last myoview 8/09 with EF 70%  . LUMBAR LAMINECTOMY    . SKIN CANCER EXCISION    . TONSILLECTOMY      Family History  Problem Relation Age of Onset  . Breast cancer Daughter   . Tuberculosis Father     Social History   Social History  . Marital status: Widowed    Spouse name: N/A  . Number of children: N/A  . Years of education: 63   Occupational History  . retired    Social History Main Topics  . Smoking status: Former Smoker    Packs/day: 1.00    Years: 30.00    Types: Cigarettes    Quit date: 06/15/2010  . Smokeless tobacco: Never Used     Comment: 2-3 cigs per week/// 06/30/12  . Alcohol use No     Comment: socially  . Drug use: No  . Sexual activity: Not Asked   Other Topics Concern  . None   Social History Narrative   Widowed   5 hours of sleep   Has friends checking on her   No Pets      Patient drinks about 6 cups of caffeine daily.   Patient is right handed.    Outpatient Medications Prior to Visit  Medication Sig  Dispense Refill  . amLODipine (NORVASC) 10 MG tablet Take 1 tablet (10 mg total) by mouth daily. 90 tablet 1  . amoxicillin-clavulanate (AUGMENTIN) 500-125 MG tablet Take 1 tablet (500 mg total) by mouth 2 (two) times daily. 8 tablet 0  . aspirin 81 MG chewable tablet Chew 81 mg by mouth daily.    . carboxymethylcellulose (REFRESH PLUS) 0.5 % SOLN Place 1 drop into both eyes 4 (four) times daily.    . Cholecalciferol (VITAMIN D) 1000 UNITS capsule Take 1,000 Units by mouth daily.      . fish oil-omega-3 fatty  acids 1000 MG capsule Take 1 g by mouth daily.     . furosemide (LASIX) 20 MG tablet Take 1 tablet (20 mg total) by mouth as needed.    . isosorbide mononitrate (IMDUR) 30 MG 24 hr tablet Take 1 tablet (30 mg total) by mouth daily. 90 tablet 2  . levalbuterol (XOPENEX HFA) 45 MCG/ACT inhaler INHALE 2 PUFFS BY MOUTH EVERY 6 HOURS AS NEEDED FOR WHEEZING 45 g 4  . levalbuterol (XOPENEX) 1.25 MG/0.5ML nebulizer solution Take 1.25 mg by nebulization every 4 (four) hours as needed for wheezing or shortness of breath. 1 each 3  . metoprolol succinate (TOPROL-XL) 25 MG 24 hr tablet Take 1 tablet (25 mg total) by mouth daily. 90 tablet 2  . nitroGLYCERIN (NITROSTAT) 0.4 MG SL tablet Place 1 tablet (0.4 mg total) under the tongue every 5 (five) minutes as needed for chest pain. 25 tablet 1  . tiotropium (SPIRIVA HANDIHALER) 18 MCG inhalation capsule INHALE CONTENTS OF 1 CAPSULE ONCE DAILY USING HANDIHALER 30 capsule 0  . traMADol (ULTRAM) 50 MG tablet TAKE ONE TABLET BY MOUTH EVERY EIGHT HOURS AS NEEDED FOR PAIN 40 tablet 0  . fluconazole (DIFLUCAN) 100 MG tablet Take 1 tablet (100 mg total) by mouth daily. 7 tablet 0  . predniSONE (DELTASONE) 10 MG tablet Take 50 mg tablet 10/18 and taper down by 10 mg daily until competed 15 tablet 0   No facility-administered medications prior to visit.      EXAM:  BP 120/86 (BP Location: Left Arm, Patient Position: Sitting, Cuff Size: Normal)   Pulse 73    Temp 97.5 F (36.4 C) (Oral)   Ht 5\' 4"  (1.626 m)   Wt 91 lb (41.3 kg)   SpO2 98%   BMI 15.62 kg/m   Body mass index is 15.62 kg/m.  Physical Exam: Vital signs reviewed [id number] is a well-developed frail  Think  wf nl cognition seen  alert cooperative    who appearsr stated age in no acute distress.   Walks cuatiously lle  With bandage and left thigh HEENT: normocephalic atraumatic , Eyes: PERRL EOM's full, conjunctiva clear, Nares: paten,t no deformity discharge or tenderness., Ears: no deformityTMs with normal landmarks. Mouth: clear OP, no lesions, edema.  Moist mucous membranes. Dentition in adequate repair. NECK: supple without masses, thyromegaly or bruits. CHEST/PULM:   Dec bs  No rale rhonchi  No retractions CV: PMI is nondisplaced, S1 S2 no gallops, murmurs, rubs.No JVD .  AExtremtities:  No clubbing cyanosis or edema, no acute joint swelling or redness no focal atrophy  Very decreased  muscel mass  djd hands with dec rom r ring finger ? Contracted ?  NEURO:  Oriented x3, cranial nerves 3-12 appear to be intact, no obvious focal weakness,gait within normal limits no abnormal reflexes or asymmetrical SKIN: No acute rashes normal turgor, color,   There is  bruising  Arms from venipuncture?  no petechiae.left shin with bandage and upper thigh for graft area senile purpura also  PSYCH: Oriented, good eye contact, no obvious depression anxiety, cognition and judgment appear normal.   Ambulatory  By self  Slow but   Independent gait  LN: no cervical  adenopathy  Lab Results  Component Value Date   WBC 11.5 (H) 11/18/2015   HGB 12.8 11/18/2015   HCT 40.0 11/18/2015   PLT 133 (L) 11/18/2015   GLUCOSE 78 11/26/2015   CHOL 160 08/02/2014   TRIG 57.0 08/02/2014   HDL 64.70 08/02/2014  LDLCALC 84 08/02/2014   ALT 19 11/12/2015   AST 28 11/12/2015   NA 139 11/26/2015   K 5.4 (H) 11/26/2015   CL 104 11/26/2015   CREATININE 1.34 (H) 11/26/2015   BUN 45 (H) 11/26/2015   CO2 20  11/26/2015   TSH 2.04 08/02/2014   INR 1.04 09/20/2010   HGBA1C 5.5 11/14/2015   Wt Readings from Last 3 Encounters:  11/27/15 91 lb (41.3 kg)  11/26/15 92 lb 12.8 oz (42.1 kg)  11/17/15 106 lb (48.1 kg)     ASSESSMENT AND PLAN:  Discussed the following assessment and plan:  Protein-calorie malnutrition, unspecified severity (HCC)  Chronic obstructive pulmonary disease, unspecified COPD type (HCC)  Thrombocytopenia (HCC)  HX: breast cancer  Underweight  Advanced age  Hospital discharge follow-up  Essential hypertension  Frailty  Decreased energy  Medication management  Senile purpura ([place]) I am Concerned mostly about food access nutrition recovery from her hospitalization. Will order home health to include OT PT bit more importantly nutrition and social work. Encouraged her to stay tobacco free she bemoans  loss of her independence somewhat. Uncertain how much food insecurity is an issue.  Ensure is certainly ok    Reviewed the lab tests done by Richardson Dopp reviewed last night with increased creatinine. Cardiology will contact her about this but also reviewed it. Want her to have a CBC and differential when she has her next blood draw in a week for chemistries because of her history of low platelets etc. Skin surgeon will follow-up on her left lower extremity wound. Pulmonary to follow-up on her severe COPD history of respiratory failure. Plan follow-up visit in 2 months if convenient for her earlier as needed. Discussed with her at length importance of nutrition and want her to gain weight that is fluid. Encouraged to  stay tobacco free. I completed her form today for [employer] collection she would not be able to handle the stress receptor goals. Patient Care Team: Burnis Medin, MD as PCP - General Rigoberto Noel, MD (Pulmonary Disease) Lelon Perla, MD (Cardiology) Suella Broad, MD (Physical Medicine and Rehabilitation) Sherren Mocha, MD as  Consulting Physician (Cardiology) Patient Instructions  Please note what  You are supposed to do with the lasix    See lab results   I want you to have  Cbc diff platelt count when repeat  BMp in a week .   We will put in an official referral to home health to include OT PT nutrition and social work. I am concerned about her ability to get good nutrition with your current clinical status. Congratulations and not using tobacco now is completely back in the hospital if you go back to tobacco.  ROV in 2 -months or there about IF weather is ok .    Ensure is ok   Want to avoid dehydration.    Standley Brooking. Sydney Hasten M.D.

## 2015-11-26 NOTE — Progress Notes (Signed)
Cardiology Office Note:    Date:  11/26/2015   ID:  [name] [name], DOB 08-22-1927, MRN [id number]  PCP:  Lottie Dawson, MD  Cardiologist:  Dr. Kirk Ruths (Dr. Sherren Mocha has seen her for PAD in the past)  Electrophysiologist:  n/a  Referring MD: Burnis Medin, MD   Chief Complaint  Patient presents with  . Hospitalization Follow-up    admx with COPD exacerbation +/- CHF    History of Present Illness:    [name] [name] is a 82 y.o. female with a hx of CAD s/p stenting to RCA in 2005, PAD with occluded L SFA by a-gram that was unfavorable for intervention and treated medically, carotid artery disease.  LHC in 2011 demonstrated patent RCA stent and no sig disease elsewhere.  Renal US in 8/10 demonstrated no RAS.  Last seen by Dr. Kirk Ruths in 7/17.  Admitted 10/11-10/17 with hypoxic respiratory failure secondary to AECOPD/aspiration pneumonia complicated by diastolic CHF. Chest CT demonstrated bilateral endobronchial disease with findings suggestive of mucus and aspiration, small bilateral effusions and mild centrilobular emphysema.  She was given IV Lasix with some improvement in symptoms. Echocardiogram demonstrated normal LV function, mild diastolic dysfunction, mild AI and mild MR and PASP 43 mmHg.  She was asked to FU here for diastolic CHF.    She is here with her friend today. She remembers me from seeing her approximately 6 years ago in the office. She denies chest discomfort. Her breathing is improving. She denies orthopnea PND. She denies edema. She denies syncope. She is no longer smoking.  Prior CV studies that were reviewed today include:    Echo 11/16/15 EF 60-65, no RWMA, Gr 1 DD, Ao sclerosis, mild AI, mild MR, severe LAE, mild RAE, mod TR, PASP 43, trivial pericardial effusion  ABIs 5/16 R 0.58; L 0.66 The right ABI is in the moderate to severely decreased range. The left ABI is in the moderately decreased range. Bilateral great  toe pressures are abnormal.  Carotid US 9/14 bilat ICA 40-59 FU 2 years  LHC 11/11 LM patent LAD irregularities, D1 ostial 25 LCx mid 25 RCA mid stent patent with 30 ISR EF 54  Past Medical History:  Diagnosis Date  . CARCINOMA, BASAL CELL 02/13/2006  . CAROTID ARTERY DISEASE 10/03/2006  . CHRONIC OBSTRUCTIVE PULMONARY DISEASE, ACUTE EXACERBATION 11/17/2009  . Chronic rhinitis 12/13/2006  . CORONARY ARTERY DISEASE 02/13/2006  . DEGENERATIVE JOINT DISEASE 09/27/2006  . DERMATITIS 09/25/2007  . Head trauma    closed  . HYPERLIPIDEMIA 03/10/2008  . HYPERTENSION 02/13/2006  . HYPOTHYROIDISM 01/30/2007  . LIVER FUNCTION TESTS, ABNORMAL 01/22/2009  . LOSS, HEARING NOS 08/25/2006  . Near syncope 09/18/2014  . NEOPLASM, SKIN, UNCERTAIN BEHAVIOR [id number]  . NEPHROLITHIASIS, HX OF 02/27/2010  . OSTEOARTHRITIS, HAND 12/16/2008  . PERIPHERAL VASCULAR DISEASE 02/13/2006  . Personal history of malignant neoplasm of breast 07/24/2008  . Personal history of urinary calculi   . Post-traumatic wound infection 12/01/2010   Mild streaking superiorly and on one day of septra  Will give rocephin and add keflex  adn follow up   . POSTHERPETIC NEURALGIA 08/25/2006  . RENAL ARTERY STENOSIS 08/01/2008  . Shingles    recurrent  . SPINAL STENOSIS 11/01/2006  . THROMBOCYTOPENIA 07/30/2008  . UNS ADVRS EFF UNS RX MEDICINAL&BIOLOGICAL SBSTNC 01/30/2007  . UNSPECIFIED ANEMIA 03/18/2008  . Unspecified vitamin D deficiency 02/07/2007  . UNSTABLE ANGINA 12/12/2009  . Vitreous detachment     Past Surgical History:  Procedure  Laterality Date  . ABDOMINAL HYSTERECTOMY    . BREAST LUMPECTOMY    . BREAST LUMPECTOMY  1998  . CATARACT EXTRACTION    . CORONARY ANGIOPLASTY WITH STENT PLACEMENT     bilat iliac stents, left renal artery and rt coronary artery last myoview 8/09 with EF 70%  . LUMBAR LAMINECTOMY    . SKIN CANCER EXCISION    . TONSILLECTOMY      Current Medications: Current Meds  Medication Sig  .  amLODipine (NORVASC) 10 MG tablet Take 1 tablet (10 mg total) by mouth daily.  Marland Kitchen amoxicillin-clavulanate (AUGMENTIN) 500-125 MG tablet Take 1 tablet (500 mg total) by mouth 2 (two) times daily.  Marland Kitchen aspirin 81 MG chewable tablet Chew 81 mg by mouth daily.  . carboxymethylcellulose (REFRESH PLUS) 0.5 % SOLN Place 1 drop into both eyes 4 (four) times daily.  . Cholecalciferol (VITAMIN D) 1000 UNITS capsule Take 1,000 Units by mouth daily.    . fish oil-omega-3 fatty acids 1000 MG capsule Take 1 g by mouth daily.   . fluconazole (DIFLUCAN) 100 MG tablet Take 1 tablet (100 mg total) by mouth daily.  . isosorbide mononitrate (IMDUR) 30 MG 24 hr tablet Take 1 tablet (30 mg total) by mouth daily.  Marland Kitchen levalbuterol (XOPENEX HFA) 45 MCG/ACT inhaler INHALE 2 PUFFS BY MOUTH EVERY 6 HOURS AS NEEDED FOR WHEEZING  . levalbuterol (XOPENEX) 1.25 MG/0.5ML nebulizer solution Take 1.25 mg by nebulization every 4 (four) hours as needed for wheezing or shortness of breath.  . metoprolol succinate (TOPROL-XL) 25 MG 24 hr tablet Take 1 tablet (25 mg total) by mouth daily.  . nitroGLYCERIN (NITROSTAT) 0.4 MG SL tablet Place 1 tablet (0.4 mg total) under the tongue every 5 (five) minutes as needed for chest pain.  . predniSONE (DELTASONE) 10 MG tablet Take 50 mg tablet 10/18 and taper down by 10 mg daily until competed  . tiotropium (SPIRIVA HANDIHALER) 18 MCG inhalation capsule INHALE CONTENTS OF 1 CAPSULE ONCE DAILY USING HANDIHALER  . traMADol (ULTRAM) 50 MG tablet TAKE ONE TABLET BY MOUTH EVERY EIGHT HOURS AS NEEDED FOR PAIN  . [DISCONTINUED] furosemide (LASIX) 20 MG tablet Take 1 tablet (20 mg total) by mouth daily.     Allergies:   Statins; Oxycodone; Albuterol; Hydrocodone-acetaminophen; and Tequin   Social History   Social History  . Marital status: Widowed    Spouse name: N/A  . Number of children: N/A  . Years of education: 25   Occupational History  . retired    Social History Main Topics  . Smoking  status: Former Smoker    Packs/day: 1.00    Years: 30.00    Types: Cigarettes    Quit date: 06/15/2010  . Smokeless tobacco: Never Used     Comment: 2-3 cigs per week/// 06/30/12  . Alcohol use No     Comment: socially  . Drug use: No  . Sexual activity: Not Asked   Other Topics Concern  . None   Social History Narrative   Widowed   5 hours of sleep   Has friends checking on her   No Pets      Patient drinks about 6 cups of caffeine daily.   Patient is right handed.     Family History:  The patient's family history includes Breast cancer in her daughter; Tuberculosis in her father.   ROS:   Please see the history of present illness.    Review of Systems  Constitution: Positive for chills,  decreased appetite, malaise/fatigue and weight loss.   All other systems reviewed and are negative.   EKGs/Labs/Other Test Reviewed:    EKG:  EKG is  ordered today.  The ekg ordered today demonstrates NSR, HR 72, normal axis, nonspecific ST-T wave changes, PACs, anteroseptal Q waves, increased artifact, QTc 413 ms, similar to prior tracings  Recent Labs: 11/12/2015: ALT 19; B Natriuretic Peptide 251.9 11/18/2015: BUN 46; Creatinine, Ser 0.88; Hemoglobin 12.8; Platelets 133; Potassium 4.3; Sodium 143   Recent Lipid Panel    Component Value Date/Time   CHOL 160 08/02/2014 0951   TRIG 57.0 08/02/2014 0951   TRIG 83 12/14/2005 1011   HDL 64.70 08/02/2014 0951   CHOLHDL 2 08/02/2014 0951   VLDL 11.4 08/02/2014 0951   LDLCALC 84 08/02/2014 0951     Physical Exam:    VS:  BP 124/60   Pulse 72   Ht 5\' 4"  (1.626 m)   Wt 92 lb 12.8 oz (42.1 kg)   SpO2 99%   BMI 15.93 kg/m     Wt Readings from Last 3 Encounters:  11/26/15 92 lb 12.8 oz (42.1 kg)  11/17/15 106 lb (48.1 kg)  08/21/15 97 lb (44 kg)     Physical Exam  Constitutional: She is oriented to person, place, and time. She appears well-developed. She appears cachectic.  HENT:  Head: Normocephalic and atraumatic.    Eyes: No scleral icterus.  Neck: No JVD present.  Cardiovascular: Normal rate, regular rhythm and normal heart sounds.   No murmur heard. Pulmonary/Chest: Effort normal. She has no wheezes. She has no rales.  Abdominal: Soft. There is no tenderness.  Musculoskeletal: She exhibits no edema.  Neurological: She is alert and oriented to person, place, and time.  Skin: Skin is warm and dry.  Psychiatric: She has a normal mood and affect.    ASSESSMENT:    1. Chronic diastolic CHF (congestive heart failure) ([place])   2. CAD in native artery   3. Bilateral carotid artery disease ([place])   4. Essential hypertension, benign   5. Pure hypercholesterolemia   6. Tobacco abuse    PLAN:    In order of problems listed above:  1. Chronic diastolic CHF - She was treated for volume excess during her recent admission with pulmonary infection. She is quite thin and frail. She has lost several pounds since discharge from the hospital. She does not appear to be volume overloaded on exam. I believe that she can change her Lasix to as needed. We discussed the importance of daily weights and when to take Lasix.  -  Change Lasix to 20 mg daily when necessary  2. CAD - Hx of stenting to the RCA.  No angina. Continue ASA, beta-blocker. She is intol of statins.  3. Carotid artery disease - No FU in 3 years.  Arrange Carotid US.  4. HTN - BP controlled.   6. HL - She is intol to statins.  7. Tobacco abuse - She has quit smoking.   Medication Adjustments/Labs and Tests Ordered: Current medicines are reviewed at length with the patient today.  Concerns regarding medicines are outlined above.  Medication changes, Labs and Tests ordered today are outlined in the Patient Instructions noted below. Patient Instructions  Medication Instructions:  CHANGE your Lasix (Furosemide) to 20 mg once daily as needed  If your weight increases 3 lbs or more in 1 day (24 hours) or you notice that your legs are swollen, you  should take Lasix that day.  If your breathing seems to be worse and your weight has increased from one day to the next, you should take Lasix that day.  Labwork: Today - BMET  Testing/Procedures: Your physician has requested that you have a carotid duplex. This test is an ultrasound of the carotid arteries in your neck. It looks at blood flow through these arteries that supply the brain with blood. Allow one hour for this exam. There are no restrictions or special instructions.  Follow-Up: Dr. Kirk Ruths in 6-8 weeks.  Any Other Special Instructions Will Be Listed Below (If Applicable). If you need a refill on your cardiac medications before your next appointment, please call your pharmacy.   Signed, Richardson Dopp, PA-C  11/26/2015 5:43 PM    [facility] [address], [place], [place]  [address] Phone: [phone]; Fax: [phone]

## 2015-11-27 ENCOUNTER — Telehealth: Payer: Self-pay | Admitting: Internal Medicine

## 2015-11-27 ENCOUNTER — Ambulatory Visit (INDEPENDENT_AMBULATORY_CARE_PROVIDER_SITE_OTHER): Payer: Medicare HMO | Admitting: Internal Medicine

## 2015-11-27 ENCOUNTER — Encounter: Payer: Self-pay | Admitting: Internal Medicine

## 2015-11-27 VITALS — BP 120/86 | HR 73 | Temp 97.5°F | Ht 64.0 in | Wt 91.0 lb

## 2015-11-27 DIAGNOSIS — R5383 Other fatigue: Secondary | ICD-10-CM

## 2015-11-27 DIAGNOSIS — E46 Unspecified protein-calorie malnutrition: Secondary | ICD-10-CM

## 2015-11-27 DIAGNOSIS — Z853 Personal history of malignant neoplasm of breast: Secondary | ICD-10-CM | POA: Diagnosis not present

## 2015-11-27 DIAGNOSIS — R54 Age-related physical debility: Secondary | ICD-10-CM

## 2015-11-27 DIAGNOSIS — R634 Abnormal weight loss: Secondary | ICD-10-CM

## 2015-11-27 DIAGNOSIS — J449 Chronic obstructive pulmonary disease, unspecified: Secondary | ICD-10-CM

## 2015-11-27 DIAGNOSIS — R636 Underweight: Secondary | ICD-10-CM

## 2015-11-27 DIAGNOSIS — Z09 Encounter for follow-up examination after completed treatment for conditions other than malignant neoplasm: Secondary | ICD-10-CM

## 2015-11-27 DIAGNOSIS — E43 Unspecified severe protein-calorie malnutrition: Secondary | ICD-10-CM

## 2015-11-27 DIAGNOSIS — D696 Thrombocytopenia, unspecified: Secondary | ICD-10-CM

## 2015-11-27 DIAGNOSIS — D692 Other nonthrombocytopenic purpura: Secondary | ICD-10-CM

## 2015-11-27 DIAGNOSIS — Z79899 Other long term (current) drug therapy: Secondary | ICD-10-CM

## 2015-11-27 DIAGNOSIS — I10 Essential (primary) hypertension: Secondary | ICD-10-CM

## 2015-11-27 NOTE — Patient Instructions (Addendum)
Please note what  You are supposed to do with the lasix    See lab results   I want you to have  Cbc diff platelt count when repeat  BMp in a week .   We will put in an official referral to home health to include OT PT nutrition and social work. I am concerned about her ability to get good nutrition with your current clinical status. Congratulations and not using tobacco now is completely back in the hospital if you go back to tobacco.  ROV in 2 -months or there about IF weather is ok .    Ensure is ok   Want to avoid dehydration.

## 2015-11-27 NOTE — Telephone Encounter (Signed)
Don with [facility] would like OT orders for  2 wk /1 1 wk/ 2 For ADL, IADL, exercise, transfer training, fall prevention, health promotion, infection prevention,  With OK to dc when goals are met for at maximum potential

## 2015-11-27 NOTE — Telephone Encounter (Signed)
Ok to do this   Saw her today and also want SW nutrition evaluation   See note

## 2015-11-28 ENCOUNTER — Other Ambulatory Visit: Payer: Self-pay | Admitting: Family Medicine

## 2015-11-28 ENCOUNTER — Encounter: Payer: Self-pay | Admitting: *Deleted

## 2015-11-28 ENCOUNTER — Telehealth: Payer: Self-pay | Admitting: *Deleted

## 2015-11-28 ENCOUNTER — Other Ambulatory Visit: Payer: Self-pay | Admitting: *Deleted

## 2015-11-28 DIAGNOSIS — D696 Thrombocytopenia, unspecified: Secondary | ICD-10-CM

## 2015-11-28 DIAGNOSIS — J690 Pneumonitis due to inhalation of food and vomit: Secondary | ICD-10-CM | POA: Diagnosis not present

## 2015-11-28 DIAGNOSIS — M6281 Muscle weakness (generalized): Secondary | ICD-10-CM | POA: Diagnosis not present

## 2015-11-28 DIAGNOSIS — J449 Chronic obstructive pulmonary disease, unspecified: Secondary | ICD-10-CM | POA: Diagnosis not present

## 2015-11-28 DIAGNOSIS — Z9114 Patient's other noncompliance with medication regimen: Secondary | ICD-10-CM | POA: Diagnosis not present

## 2015-11-28 DIAGNOSIS — I5032 Chronic diastolic (congestive) heart failure: Secondary | ICD-10-CM | POA: Diagnosis not present

## 2015-11-28 DIAGNOSIS — I10 Essential (primary) hypertension: Secondary | ICD-10-CM

## 2015-11-28 NOTE — Telephone Encounter (Signed)
Pt notified of lab results and findings. Pt states she eats banana's everyday. I asked pt to cut back on bananas to maybe 2-3 times a week. I did state though her K+ lab order was hemolyzed which is really causing her K+ to elevated. Kidney function worse. Pt advise to not take any lasix for 3 days and then to make sure she is only taking it PRN fot weight gain of 3 lb's or more x 1 day. Pt will have repeat bmet 11/1/17at the [facility] office when she goes for her Carotid U/S. Pt is agreeable to plan of care and verbalized all instructions by phone.

## 2015-11-28 NOTE — Telephone Encounter (Signed)
Spoke to [facility] and informed him to proceed with orders.  [name] [name] does not do nutrition eval.  Sent a message to Darlina Guys at [facility] to see if nutrition is a service they provide.  Waiting on a response.

## 2015-12-01 DIAGNOSIS — J690 Pneumonitis due to inhalation of food and vomit: Secondary | ICD-10-CM | POA: Diagnosis not present

## 2015-12-01 DIAGNOSIS — J449 Chronic obstructive pulmonary disease, unspecified: Secondary | ICD-10-CM | POA: Diagnosis not present

## 2015-12-01 DIAGNOSIS — Z9114 Patient's other noncompliance with medication regimen: Secondary | ICD-10-CM | POA: Diagnosis not present

## 2015-12-01 DIAGNOSIS — M6281 Muscle weakness (generalized): Secondary | ICD-10-CM | POA: Diagnosis not present

## 2015-12-01 DIAGNOSIS — I5032 Chronic diastolic (congestive) heart failure: Secondary | ICD-10-CM | POA: Diagnosis not present

## 2015-12-02 DIAGNOSIS — I5032 Chronic diastolic (congestive) heart failure: Secondary | ICD-10-CM | POA: Diagnosis not present

## 2015-12-02 DIAGNOSIS — J690 Pneumonitis due to inhalation of food and vomit: Secondary | ICD-10-CM | POA: Diagnosis not present

## 2015-12-02 DIAGNOSIS — Z9114 Patient's other noncompliance with medication regimen: Secondary | ICD-10-CM | POA: Diagnosis not present

## 2015-12-02 DIAGNOSIS — M6281 Muscle weakness (generalized): Secondary | ICD-10-CM | POA: Diagnosis not present

## 2015-12-02 DIAGNOSIS — J449 Chronic obstructive pulmonary disease, unspecified: Secondary | ICD-10-CM | POA: Diagnosis not present

## 2015-12-02 NOTE — Telephone Encounter (Signed)
[name] [name] sent me a message back stating that [facility] does do nutritional evaluations but do not accept the patient's insurance.    I have left a message at [facility] for someone on the nursing staff to give me a call and inform me if they can do nutrition eval and if they accept her insurance.  Waiting on a response.

## 2015-12-03 ENCOUNTER — Encounter (HOSPITAL_COMMUNITY): Payer: Medicare HMO

## 2015-12-03 DIAGNOSIS — J690 Pneumonitis due to inhalation of food and vomit: Secondary | ICD-10-CM | POA: Diagnosis not present

## 2015-12-03 DIAGNOSIS — Z9114 Patient's other noncompliance with medication regimen: Secondary | ICD-10-CM | POA: Diagnosis not present

## 2015-12-03 DIAGNOSIS — I5032 Chronic diastolic (congestive) heart failure: Secondary | ICD-10-CM | POA: Diagnosis not present

## 2015-12-03 DIAGNOSIS — J449 Chronic obstructive pulmonary disease, unspecified: Secondary | ICD-10-CM | POA: Diagnosis not present

## 2015-12-03 DIAGNOSIS — M6281 Muscle weakness (generalized): Secondary | ICD-10-CM | POA: Diagnosis not present

## 2015-12-03 NOTE — Telephone Encounter (Signed)
[name] from [facility] returned my call.  She stated she is able to do nutritional consult and [facility] does accept [name].  [facility] is part of [employer] so an order was placed.  I will follow up with [name] to make sure she has all information that is required.  [name]'s direct number is [phone].

## 2015-12-04 DIAGNOSIS — T8189XA Other complications of procedures, not elsewhere classified, initial encounter: Secondary | ICD-10-CM | POA: Diagnosis not present

## 2015-12-04 NOTE — Telephone Encounter (Signed)
Left a message for [facility] to call back.  Checking to see if pt was set up for services.

## 2015-12-05 NOTE — Telephone Encounter (Signed)
[name] [name] called back and informed me that she thought it was for tube feeding not an actual dietary consult as stated.  [facility] is no longer able to accept this patient.  She did reach out and [facility] will accept this patient and her insurance.  They have agreed to do dietary consult.  Will need to place a new home health referral for nursing and social services.  [name] [name] will then print the order and give to [facility].  I will follow up with [facility] once the new order is in.

## 2015-12-08 ENCOUNTER — Telehealth: Payer: Self-pay | Admitting: Internal Medicine

## 2015-12-08 ENCOUNTER — Other Ambulatory Visit: Payer: Self-pay | Admitting: Family Medicine

## 2015-12-08 DIAGNOSIS — R636 Underweight: Secondary | ICD-10-CM

## 2015-12-08 DIAGNOSIS — J449 Chronic obstructive pulmonary disease, unspecified: Secondary | ICD-10-CM | POA: Diagnosis not present

## 2015-12-08 DIAGNOSIS — R54 Age-related physical debility: Secondary | ICD-10-CM

## 2015-12-08 DIAGNOSIS — E46 Unspecified protein-calorie malnutrition: Secondary | ICD-10-CM

## 2015-12-08 DIAGNOSIS — I5032 Chronic diastolic (congestive) heart failure: Secondary | ICD-10-CM | POA: Diagnosis not present

## 2015-12-08 DIAGNOSIS — J690 Pneumonitis due to inhalation of food and vomit: Secondary | ICD-10-CM | POA: Diagnosis not present

## 2015-12-08 DIAGNOSIS — M6281 Muscle weakness (generalized): Secondary | ICD-10-CM | POA: Diagnosis not present

## 2015-12-08 DIAGNOSIS — Z9114 Patient's other noncompliance with medication regimen: Secondary | ICD-10-CM | POA: Diagnosis not present

## 2015-12-08 NOTE — Telephone Encounter (Signed)
[name] [name] would like verbal order for once a wk for 3 wks to follow pt healing wounds, [name] [name] has original wound care order from surgeon

## 2015-12-08 NOTE — Telephone Encounter (Signed)
Spoke to Kendall and informed her that the new order has been placed.  She will pass it along to [facility]. Notified her that I will follow up tomorrow afternoon (12/09/15).  [name] agreed.

## 2015-12-09 NOTE — Telephone Encounter (Signed)
Spoke to [place] and informed him that the orders will need to come from the surgeon.  Evelena Peat will Dentist.

## 2015-12-09 NOTE — Telephone Encounter (Signed)
Spoke to [place]. She will call her office and make sure the order was pulled off of [employer] and faxed to [facility].  She will call back before 5 PM today.

## 2015-12-10 DIAGNOSIS — I5032 Chronic diastolic (congestive) heart failure: Secondary | ICD-10-CM | POA: Diagnosis not present

## 2015-12-10 DIAGNOSIS — M6281 Muscle weakness (generalized): Secondary | ICD-10-CM | POA: Diagnosis not present

## 2015-12-10 DIAGNOSIS — J449 Chronic obstructive pulmonary disease, unspecified: Secondary | ICD-10-CM | POA: Diagnosis not present

## 2015-12-10 DIAGNOSIS — J690 Pneumonitis due to inhalation of food and vomit: Secondary | ICD-10-CM | POA: Diagnosis not present

## 2015-12-10 DIAGNOSIS — Z9114 Patient's other noncompliance with medication regimen: Secondary | ICD-10-CM | POA: Diagnosis not present

## 2015-12-10 NOTE — Telephone Encounter (Signed)
[name] called this morning to report "everything is good to go: with the pt.  Will now close the note.

## 2015-12-12 ENCOUNTER — Ambulatory Visit (HOSPITAL_COMMUNITY)
Admission: RE | Admit: 2015-12-12 | Discharge: 2015-12-12 | Disposition: A | Discharge status: Home / Self Care | Payer: Medicare HMO | Source: Ambulatory Visit | Attending: Cardiology | Admitting: Cardiology

## 2015-12-12 DIAGNOSIS — I6523 Occlusion and stenosis of bilateral carotid arteries: Secondary | ICD-10-CM | POA: Insufficient documentation

## 2015-12-12 DIAGNOSIS — I739 Peripheral vascular disease, unspecified: Secondary | ICD-10-CM

## 2015-12-12 DIAGNOSIS — I779 Disorder of arteries and arterioles, unspecified: Secondary | ICD-10-CM | POA: Diagnosis not present

## 2015-12-15 ENCOUNTER — Telehealth: Payer: Self-pay | Admitting: *Deleted

## 2015-12-15 ENCOUNTER — Encounter: Payer: Self-pay | Admitting: Physician Assistant

## 2015-12-15 DIAGNOSIS — I739 Peripheral vascular disease, unspecified: Principal | ICD-10-CM

## 2015-12-15 DIAGNOSIS — I779 Disorder of arteries and arterioles, unspecified: Secondary | ICD-10-CM

## 2015-12-15 NOTE — Telephone Encounter (Signed)
Pt notified of Carotid US results and findings by phone with verbal understanding. I will forward a copy of results to Dr. Regis Bill . Pt agreeable to repeat carotid US in 1 yr.

## 2015-12-19 ENCOUNTER — Ambulatory Visit (INDEPENDENT_AMBULATORY_CARE_PROVIDER_SITE_OTHER): Payer: Medicare HMO | Admitting: Adult Health

## 2015-12-19 ENCOUNTER — Encounter: Payer: Self-pay | Admitting: Adult Health

## 2015-12-19 VITALS — BP 122/78 | HR 60 | Temp 97.4°F | Ht 64.0 in | Wt 98.8 lb

## 2015-12-19 DIAGNOSIS — J441 Chronic obstructive pulmonary disease with (acute) exacerbation: Secondary | ICD-10-CM | POA: Diagnosis not present

## 2015-12-19 DIAGNOSIS — J189 Pneumonia, unspecified organism: Secondary | ICD-10-CM | POA: Diagnosis not present

## 2015-12-19 DIAGNOSIS — R69 Illness, unspecified: Secondary | ICD-10-CM | POA: Diagnosis not present

## 2015-12-19 DIAGNOSIS — F17200 Nicotine dependence, unspecified, uncomplicated: Secondary | ICD-10-CM

## 2015-12-19 NOTE — Patient Instructions (Addendum)
Take on Spiriva daily.  Work on not smoking .  Call back if you change your mind on chest xray . Order is in if you come back by here.  Follow up Dr. Elsworth Soho  In 6  months and As needed

## 2015-12-19 NOTE — Assessment & Plan Note (Signed)
Recent flare now resolving  Check cxr today  Smoking cessation discussed  Plan  . Patient Instructions  Take on Spiriva daily.  Work on not smoking .  Call back if you change your mind on chest xray . Order is in if you come back by here.  Follow up Dr. Elsworth Soho  In 6  months and As needed

## 2015-12-19 NOTE — Assessment & Plan Note (Signed)
Cessation discussed 

## 2015-12-19 NOTE — Assessment & Plan Note (Signed)
Recent PNA , clinically improved  Check cxr today   Plan  Patient Instructions  Take on Spiriva daily.  Work on not smoking .  Call back if you change your mind on chest xray . Order is in if you come back by here.  Follow up Dr. Elsworth Soho  In 6  months and As needed

## 2015-12-19 NOTE — Progress Notes (Signed)
Subjective:    Patient ID: [name] [name], female    DOB: 25-May-1927, 82 y.o.   MRN: [id number]  HPI  82 yo female smoker with COPD GOLD II w/ recurrent LLL atx .   07/2010 >>Spirometry >>FEV1 61%  She was first admitted 07/2010 for hypoxia & LLL atelectasis.  Ct chest showed Volume loss in the inferior left hemithorax with filling defect of the left lower and less so left upper lobe  Bronchoscopy showed LLL endobronchial narrowing, without discrete mass. Papule on rt mainstem bronchus was brushed - cytology all neg. BAL cx, afb, fungal neg  Re- admitted 09/2010 for pneumonia & LLL atx. Rpt bscopy >>thick mucoid secretions with difficulty passing scope. Cytology was neg for malignancy . Path showed AMYLOID DEPOSITION AND DYSTROPHIC CALCIFICATIONS.  CT sinuses 09/2010 neg  CT 06/2011.>>No residual lymphadenopathy. No evidence of metastatic breast cancer. Stable chronic lung disease with biapical scarring, diffuse calcifications of the tracheobronchial tree and mucous impaction of the lingular bronchus  Living will/DNR    12/19/2015 Follow up : COPD  Pt returns for 6 month follow up .  Was admitted last month for decompensated diastolic CHF , aspiration PNA and COPD exacerbation . She was treated with IV abx and steroids . She is starting to feel better. We discussed cxr today but she declines .  Denies any wheezing, chest congestion/tightness, fever, nausea or vomiting.  Remains on Spiriva daily . -says she does not take everyday .   Prevnar, Pneumovax , TDAP , Flu shot utd.  Still smoking some. Discussed cessation .   Had skin cancer on leg had to have skin graft. Has to do daily dressing changes.     Past Medical History:  Diagnosis Date  . CARCINOMA, BASAL CELL 02/13/2006  . CAROTID ARTERY DISEASE 10/03/2006   Carotid US 11/17: Stable 40-59% bilateral ICA stenosis >> f/u 1 year  . CHRONIC OBSTRUCTIVE PULMONARY DISEASE, ACUTE EXACERBATION 11/17/2009  . Chronic rhinitis  12/13/2006  . CORONARY ARTERY DISEASE 02/13/2006  . DEGENERATIVE JOINT DISEASE 09/27/2006  . DERMATITIS 09/25/2007  . Head trauma    closed  . HYPERLIPIDEMIA 03/10/2008  . HYPERTENSION 02/13/2006  . HYPOTHYROIDISM 01/30/2007  . LIVER FUNCTION TESTS, ABNORMAL 01/22/2009  . LOSS, HEARING NOS 08/25/2006  . Near syncope 09/18/2014  . NEOPLASM, SKIN, UNCERTAIN BEHAVIOR [id number]  . NEPHROLITHIASIS, HX OF 02/27/2010  . OSTEOARTHRITIS, HAND 12/16/2008  . PERIPHERAL VASCULAR DISEASE 02/13/2006  . Personal history of malignant neoplasm of breast 07/24/2008  . Personal history of urinary calculi   . Post-traumatic wound infection 12/01/2010   Mild streaking superiorly and on one day of septra  Will give rocephin and add keflex  adn follow up   . POSTHERPETIC NEURALGIA 08/25/2006  . RENAL ARTERY STENOSIS 08/01/2008  . Shingles    recurrent  . SPINAL STENOSIS 11/01/2006  . THROMBOCYTOPENIA 07/30/2008  . UNS ADVRS EFF UNS RX MEDICINAL&BIOLOGICAL SBSTNC 01/30/2007  . UNSPECIFIED ANEMIA 03/18/2008  . Unspecified vitamin D deficiency 02/07/2007  . UNSTABLE ANGINA 12/12/2009  . Vitreous detachment    Current Outpatient Prescriptions on File Prior to Visit  Medication Sig Dispense Refill  . amLODipine (NORVASC) 10 MG tablet Take 1 tablet (10 mg total) by mouth daily. 90 tablet 1  . aspirin 81 MG chewable tablet Chew 81 mg by mouth daily.    . carboxymethylcellulose (REFRESH PLUS) 0.5 % SOLN Place 1 drop into both eyes 4 (four) times daily.    . Cholecalciferol (VITAMIN D) 1000 UNITS capsule  Take 1,000 Units by mouth daily.      . fish oil-omega-3 fatty acids 1000 MG capsule Take 1 g by mouth daily.     . furosemide (LASIX) 20 MG tablet Take 1 tablet (20 mg total) by mouth as needed.    . isosorbide mononitrate (IMDUR) 30 MG 24 hr tablet Take 1 tablet (30 mg total) by mouth daily. 90 tablet 2  . levalbuterol (XOPENEX HFA) 45 MCG/ACT inhaler INHALE 2 PUFFS BY MOUTH EVERY 6 HOURS AS NEEDED FOR WHEEZING 45 g 4  .  levalbuterol (XOPENEX) 1.25 MG/0.5ML nebulizer solution Take 1.25 mg by nebulization every 4 (four) hours as needed for wheezing or shortness of breath. 1 each 3  . metoprolol succinate (TOPROL-XL) 25 MG 24 hr tablet Take 1 tablet (25 mg total) by mouth daily. 90 tablet 2  . nitroGLYCERIN (NITROSTAT) 0.4 MG SL tablet Place 1 tablet (0.4 mg total) under the tongue every 5 (five) minutes as needed for chest pain. 25 tablet 1  . tiotropium (SPIRIVA HANDIHALER) 18 MCG inhalation capsule INHALE CONTENTS OF 1 CAPSULE ONCE DAILY USING HANDIHALER 30 capsule 0  . traMADol (ULTRAM) 50 MG tablet TAKE ONE TABLET BY MOUTH EVERY EIGHT HOURS AS NEEDED FOR PAIN 40 tablet 0   No current facility-administered medications on file prior to visit.      Review of Systems Constitutional:   No  weight loss, night sweats,  Fevers, chills, + fatigue, or  lassitude.  HEENT:   No headaches,  Difficulty swallowing,  Tooth/dental problems, or  Sore throat,                No sneezing, itching, ear ache,  +nasal congestion, post nasal drip,   CV:  No chest pain,  Orthopnea, PND, swelling in lower extremities, anasarca, dizziness, palpitations, syncope.   GI  No heartburn, indigestion, abdominal pain, nausea, vomiting, diarrhea, change in bowel habits, loss of appetite, bloody stools.   Resp:  No coughing up of blood.  No change in color of mucus.  No wheezing.  No chest wall deformity  Skin: no rash or lesions.  GU: no dysuria, change in color of urine, no urgency or frequency.  No flank pain, no hematuria   MS:  No joint pain or swelling.  No decreased range of motion.  + gait issues   Psych:  No change in mood or affect. No depression or anxiety.  No memory loss.         Objective:   Physical Exam  Vitals:   12/19/15 0935  BP: 122/78  Pulse: 60  Temp: 97.4 F (36.3 C)  TempSrc: Oral  SpO2: 100%  Weight: 98 lb 12.8 oz (44.8 kg)  Height: 5\' 4"  (1.626 m)    GEN: A/Ox3; pleasant , NAD,  Frail ,      HEENT:  [place]/AT,  EACs-clear, TMs-wnl, NOSE-clear, THROAT-clear, no lesions, no postnasal drip or exudate noted.   NECK:  Supple w/ fair ROM; no JVD; normal carotid impulses w/o bruits; no thyromegaly or nodules palpated; no lymphadenopathy.    RESP  Decreased BS in bases , few trace rhonchi , . no accessory muscle use, no dullness to percussion  CARD:  RRR, no m/r/g  , no peripheral edema, pulses intact, no cyanosis or clubbing.  GI:   Soft & nt; nml bowel sounds; no organomegaly or masses detected.   Musco: Warm bil, no deformities or joint swelling noted. , unsteady   Neuro: alert, no focal deficits noted.  Skin: Warm, no lesions or rashes     Sarajane Fambrough NP-C  [facility]  12/19/2015

## 2015-12-19 NOTE — Addendum Note (Signed)
Addended by: Doroteo Glassman D on: 12/19/2015 11:14 AM   Modules accepted: Orders

## 2015-12-24 ENCOUNTER — Other Ambulatory Visit: Payer: Self-pay | Admitting: Pulmonary Disease

## 2015-12-24 NOTE — Progress Notes (Signed)
Reviewed & agree with plan  

## 2015-12-29 DIAGNOSIS — D485 Neoplasm of uncertain behavior of skin: Secondary | ICD-10-CM | POA: Diagnosis not present

## 2015-12-29 DIAGNOSIS — L570 Actinic keratosis: Secondary | ICD-10-CM | POA: Diagnosis not present

## 2016-01-12 NOTE — Progress Notes (Signed)
HPI: FU CAD; s/p stenting to RCA in 2005; also with PAD with occluded L SFA by a-gram that was unfavorable for intervention and treated medically, carotid artery disease. Since last seen, she has mild dyspnea on exertion but no orthopnea, PND, pedal edema, exertional chest pain or syncope.  Prior CV studies that were reviewed today include:    Echo 11/16/15 EF 60-65, no RWMA, Gr 1 DD, Ao sclerosis, mild AI, mild MR, severe LAE, mild RAE, mod TR, PASP 43, trivial pericardial effusion  ABIs 5/16 R 0.58; L 0.66 The right ABI is in the moderate to severely decreased range. The left ABI is in the moderately decreased range. Bilateral great toe pressures are abnormal.  Carotid US 11/17 40-59% bilateral stenosis; follow-up study 1 year  LHC 11/11 LM patent LAD irregularities, D1 ostial 25 LCx mid 25 RCA mid stent patent with 30 ISR EF 65   Current Outpatient Prescriptions  Medication Sig Dispense Refill  . amLODipine (NORVASC) 10 MG tablet Take 1 tablet (10 mg total) by mouth daily. 90 tablet 1  . aspirin 81 MG tablet Take 81 mg by mouth daily.    . carboxymethylcellulose (REFRESH PLUS) 0.5 % SOLN Place 1 drop into both eyes 4 (four) times daily.    . Cholecalciferol (VITAMIN D) 1000 UNITS capsule Take 1,000 Units by mouth daily.      . fish oil-omega-3 fatty acids 1000 MG capsule Take 1 g by mouth daily.     . isosorbide mononitrate (IMDUR) 30 MG 24 hr tablet Take 1 tablet (30 mg total) by mouth daily. 90 tablet 2  . levalbuterol (XOPENEX HFA) 45 MCG/ACT inhaler INHALE 2 PUFFS BY MOUTH EVERY 6 HOURS AS NEEDED FOR WHEEZING 45 g 4  . metoprolol succinate (TOPROL-XL) 25 MG 24 hr tablet Take 1 tablet (25 mg total) by mouth daily. 90 tablet 2  . nitroGLYCERIN (NITROSTAT) 0.4 MG SL tablet Place 1 tablet (0.4 mg total) under the tongue every 5 (five) minutes as needed for chest pain. 25 tablet 1  . SPIRIVA HANDIHALER 18 MCG inhalation capsule INHALE THE CONTENTS OF 1 CAPSULE VIA  INHALATION DEVICE EVERY DAY 30 capsule 5  . traMADol (ULTRAM) 50 MG tablet TAKE ONE TABLET BY MOUTH EVERY EIGHT HOURS AS NEEDED FOR PAIN 40 tablet 0   No current facility-administered medications for this visit.      Past Medical History:  Diagnosis Date  . CARCINOMA, BASAL CELL 02/13/2006  . CAROTID ARTERY DISEASE 10/03/2006   Carotid US 11/17: Stable 40-59% bilateral ICA stenosis >> f/u 1 year  . CHRONIC OBSTRUCTIVE PULMONARY DISEASE, ACUTE EXACERBATION 11/17/2009  . Chronic rhinitis 12/13/2006  . CORONARY ARTERY DISEASE 02/13/2006  . DEGENERATIVE JOINT DISEASE 09/27/2006  . DERMATITIS 09/25/2007  . Head trauma    closed  . HYPERLIPIDEMIA 03/10/2008  . HYPERTENSION 02/13/2006  . HYPOTHYROIDISM 01/30/2007  . LIVER FUNCTION TESTS, ABNORMAL 01/22/2009  . LOSS, HEARING NOS 08/25/2006  . Near syncope 09/18/2014  . NEOPLASM, SKIN, UNCERTAIN BEHAVIOR [id number]  . NEPHROLITHIASIS, HX OF 02/27/2010  . OSTEOARTHRITIS, HAND 12/16/2008  . PERIPHERAL VASCULAR DISEASE 02/13/2006  . Personal history of malignant neoplasm of breast 07/24/2008  . Personal history of urinary calculi   . Post-traumatic wound infection 12/01/2010   Mild streaking superiorly and on one day of septra  Will give rocephin and add keflex  adn follow up   . POSTHERPETIC NEURALGIA 08/25/2006  . RENAL ARTERY STENOSIS 08/01/2008  . Shingles    recurrent  .  SPINAL STENOSIS 11/01/2006  . THROMBOCYTOPENIA 07/30/2008  . UNS ADVRS EFF UNS RX MEDICINAL&BIOLOGICAL SBSTNC 01/30/2007  . UNSPECIFIED ANEMIA 03/18/2008  . Unspecified vitamin D deficiency 02/07/2007  . UNSTABLE ANGINA 12/12/2009  . Vitreous detachment     Past Surgical History:  Procedure Laterality Date  . ABDOMINAL HYSTERECTOMY    . BREAST LUMPECTOMY    . BREAST LUMPECTOMY  1998  . CATARACT EXTRACTION    . CORONARY ANGIOPLASTY WITH STENT PLACEMENT     bilat iliac stents, left renal artery and rt coronary artery last myoview 8/09 with EF 70%  . LUMBAR LAMINECTOMY    .  SKIN CANCER EXCISION    . TONSILLECTOMY      Social History   Social History  . Marital status: Widowed    Spouse name: N/A  . Number of children: N/A  . Years of education: 60   Occupational History  . retired    Social History Main Topics  . Smoking status: Former Smoker    Packs/day: 1.00    Years: 30.00    Types: Cigarettes    Quit date: 06/15/2010  . Smokeless tobacco: Never Used     Comment: 2-3 cigs per week/// 06/30/12  . Alcohol use No     Comment: socially  . Drug use: No  . Sexual activity: Not on file   Other Topics Concern  . Not on file   Social History Narrative   Widowed   5 hours of sleep   Has friends checking on her   No Pets      Patient drinks about 6 cups of caffeine daily.   Patient is right handed.    Family History  Problem Relation Age of Onset  . Breast cancer Daughter   . Tuberculosis Father     ROS: no fevers or chills, productive cough, hemoptysis, dysphasia, odynophagia, melena, hematochezia, dysuria, hematuria, rash, seizure activity, orthopnea, PND, pedal edema, claudication. Remaining systems are negative.  Physical Exam: Well-developed frail in no acute distress.  Skin is warm and dry.  HEENT is normal.  Neck is supple.  Chest expiratory rhonchi Cardiovascular exam is regular rate and rhythm. 2/6 systolic murmur Abdominal exam nontender or distended. No masses palpated. Extremities show no edema. neuro grossly intact  ECG-Sinus rhythm with occasional PACs and PVCs. Septal infarct.  A/P  1 Chronic diastolic congestive heart failure-patient remains euvolemic on examination.  2 coronary artery disease-continue aspirin. Intolerant to statins.  3 carotid artery disease-schedule follow-up carotid Dopplers November 2018. Continue aspirin.  4 hypertension-blood pressure controlled. Continue present medications.  5 hyperlipidemia-continue diet. Intolerant to statins.  6 tobacco abuse-patient counseled on  discontinuing.  7 renal artery stenosis-continue aspirin.  8 peripheral vascular disease-followed by Dr. Burt Knack.  Kirk Ruths, MD

## 2016-01-15 ENCOUNTER — Encounter: Payer: Self-pay | Admitting: Cardiology

## 2016-01-15 ENCOUNTER — Ambulatory Visit (INDEPENDENT_AMBULATORY_CARE_PROVIDER_SITE_OTHER): Payer: Medicare HMO | Admitting: Cardiology

## 2016-01-15 VITALS — BP 142/54 | HR 62 | Ht 64.0 in | Wt 101.0 lb

## 2016-01-15 DIAGNOSIS — I2510 Atherosclerotic heart disease of native coronary artery without angina pectoris: Secondary | ICD-10-CM

## 2016-01-15 DIAGNOSIS — Z720 Tobacco use: Secondary | ICD-10-CM | POA: Diagnosis not present

## 2016-01-15 DIAGNOSIS — I10 Essential (primary) hypertension: Secondary | ICD-10-CM

## 2016-01-15 DIAGNOSIS — E7800 Pure hypercholesterolemia, unspecified: Secondary | ICD-10-CM | POA: Diagnosis not present

## 2016-01-15 NOTE — Patient Instructions (Signed)
Your physician wants you to follow-up in: 6 MONTHS WITH DR CRENSHAW You will receive a reminder letter in the mail two months in advance. If you don't receive a letter, please call our office to schedule the follow-up appointment.   If you need a refill on your cardiac medications before your next appointment, please call your pharmacy.  

## 2016-01-27 ENCOUNTER — Ambulatory Visit (INDEPENDENT_AMBULATORY_CARE_PROVIDER_SITE_OTHER): Payer: Medicare HMO | Admitting: Internal Medicine

## 2016-01-27 ENCOUNTER — Encounter: Payer: Self-pay | Admitting: Internal Medicine

## 2016-01-27 VITALS — BP 144/76 | HR 72 | Temp 98.1°F | Ht 64.0 in | Wt 102.0 lb

## 2016-01-27 DIAGNOSIS — R54 Age-related physical debility: Secondary | ICD-10-CM | POA: Diagnosis not present

## 2016-01-27 DIAGNOSIS — R636 Underweight: Secondary | ICD-10-CM | POA: Diagnosis not present

## 2016-01-27 DIAGNOSIS — E46 Unspecified protein-calorie malnutrition: Secondary | ICD-10-CM | POA: Diagnosis not present

## 2016-01-27 DIAGNOSIS — Z853 Personal history of malignant neoplasm of breast: Secondary | ICD-10-CM | POA: Diagnosis not present

## 2016-01-27 DIAGNOSIS — I7090 Unspecified atherosclerosis: Secondary | ICD-10-CM

## 2016-01-27 DIAGNOSIS — Z7189 Other specified counseling: Secondary | ICD-10-CM

## 2016-01-27 DIAGNOSIS — F17200 Nicotine dependence, unspecified, uncomplicated: Secondary | ICD-10-CM

## 2016-01-27 NOTE — Patient Instructions (Signed)
Pay attention to nutrition .  As discussed .  Keep fu appts with your specialists .     ROV 4-6 months . Or as needed

## 2016-01-27 NOTE — Progress Notes (Signed)
Pre visit review using our clinic review tool, if applicable. No additional management support is needed unless otherwise documented below in the visit note.  Chief Complaint  Patient presents with  . Follow-up    HPI: [name] [name] 82 y.o.     Multiple med problems includiing vascular diseasePVD and CAD  chornic daistolic hf copd  Ht advancedage  fraily with hs of falling   Continued tobacco  Spinal tensosis  djd /OA  Underweight  Still kiving alone  rx for pna and had fu pulm and cards   Vit d level ok 4 17  rx for fosamax given  That she didn't take it because she states she doesn't really have osteoporosis she has osteoarthritis. Is not interested in getting a bone density and hasn't had a fracture. She's chagrined because home health is no longer coming to address her skin graft wound and it is expensive for her to 5 bandages. She still continues tobacco tried eating better. No falling. States that she has outlived her mom and "doesn't know why she still round". But denies a specific depression. Family lives in [place] wants her to move up there but she is not sure she wants to do that. ROS: See pertinent positives and negatives per HPI. No recentfalling  States her car is hanging on a thread mechanic patches without finances are difficult and limited.  Past Medical History:  Diagnosis Date  . CARCINOMA, BASAL CELL 02/13/2006  . CAROTID ARTERY DISEASE 10/03/2006   Carotid US 11/17: Stable 40-59% bilateral ICA stenosis >> f/u 1 year  . CHRONIC OBSTRUCTIVE PULMONARY DISEASE, ACUTE EXACERBATION 11/17/2009  . Chronic rhinitis 12/13/2006  . CORONARY ARTERY DISEASE 02/13/2006  . DEGENERATIVE JOINT DISEASE 09/27/2006  . DERMATITIS 09/25/2007  . Head trauma    closed  . HYPERLIPIDEMIA 03/10/2008  . HYPERTENSION 02/13/2006  . HYPOTHYROIDISM 01/30/2007  . LIVER FUNCTION TESTS, ABNORMAL 01/22/2009  . LOSS, HEARING NOS 08/25/2006  . Near syncope 09/18/2014  . NEOPLASM, SKIN, UNCERTAIN  BEHAVIOR [id number]  . NEPHROLITHIASIS, HX OF 02/27/2010  . OSTEOARTHRITIS, HAND 12/16/2008  . PERIPHERAL VASCULAR DISEASE 02/13/2006  . Personal history of malignant neoplasm of breast 07/24/2008  . Personal history of urinary calculi   . Post-traumatic wound infection 12/01/2010   Mild streaking superiorly and on one day of septra  Will give rocephin and add keflex  adn follow up   . POSTHERPETIC NEURALGIA 08/25/2006  . RENAL ARTERY STENOSIS 08/01/2008  . Shingles    recurrent  . SPINAL STENOSIS 11/01/2006  . THROMBOCYTOPENIA 07/30/2008  . UNS ADVRS EFF UNS RX MEDICINAL&BIOLOGICAL SBSTNC 01/30/2007  . UNSPECIFIED ANEMIA 03/18/2008  . Unspecified vitamin D deficiency 02/07/2007  . UNSTABLE ANGINA 12/12/2009  . Vitreous detachment     Family History  Problem Relation Age of Onset  . Breast cancer Daughter   . Tuberculosis Father     Social History   Social History  . Marital status: Widowed    Spouse name: N/A  . Number of children: N/A  . Years of education: 31   Occupational History  . retired    Social History Main Topics  . Smoking status: Former Smoker    Packs/day: 1.00    Years: 30.00    Types: Cigarettes    Quit date: 06/15/2010  . Smokeless tobacco: Never Used     Comment: 2-3 cigs per week/// 06/30/12  . Alcohol use No     Comment: socially  . Drug use: No  . Sexual  activity: Not Asked   Other Topics Concern  . None   Social History Narrative   Widowed   5 hours of sleep   Has friends checking on her   No Pets      Patient drinks about 6 cups of caffeine daily.   Patient is right handed.    Outpatient Medications Prior to Visit  Medication Sig Dispense Refill  . amLODipine (NORVASC) 10 MG tablet Take 1 tablet (10 mg total) by mouth daily. 90 tablet 1  . aspirin 81 MG tablet Take 81 mg by mouth daily.    . carboxymethylcellulose (REFRESH PLUS) 0.5 % SOLN Place 1 drop into both eyes 4 (four) times daily.    . Cholecalciferol (VITAMIN D) 1000 UNITS  capsule Take 1,000 Units by mouth daily.      . fish oil-omega-3 fatty acids 1000 MG capsule Take 1 g by mouth daily.     . isosorbide mononitrate (IMDUR) 30 MG 24 hr tablet Take 1 tablet (30 mg total) by mouth daily. 90 tablet 2  . levalbuterol (XOPENEX HFA) 45 MCG/ACT inhaler INHALE 2 PUFFS BY MOUTH EVERY 6 HOURS AS NEEDED FOR WHEEZING 45 g 4  . metoprolol succinate (TOPROL-XL) 25 MG 24 hr tablet Take 1 tablet (25 mg total) by mouth daily. 90 tablet 2  . nitroGLYCERIN (NITROSTAT) 0.4 MG SL tablet Place 1 tablet (0.4 mg total) under the tongue every 5 (five) minutes as needed for chest pain. 25 tablet 1  . SPIRIVA HANDIHALER 18 MCG inhalation capsule INHALE THE CONTENTS OF 1 CAPSULE VIA INHALATION DEVICE EVERY DAY 30 capsule 5  . traMADol (ULTRAM) 50 MG tablet TAKE ONE TABLET BY MOUTH EVERY EIGHT HOURS AS NEEDED FOR PAIN 40 tablet 0   No facility-administered medications prior to visit.      EXAM:  BP (!) 144/76 (BP Location: Left Arm, Patient Position: Sitting, Cuff Size: Normal)   Pulse 72   Temp 98.1 F (36.7 C) (Oral)   Ht 5\' 4"  (1.626 m)   Wt 102 lb (46.3 kg)   SpO2 96%   BMI 17.51 kg/m   Body mass index is 17.51 kg/m.  GENERAL: vitals reviewed and listed above, alert, oriented, Slender cognitively intact nontoxic. appears well hydrated and in no acute distress HEENT: atraumatic, conjunctiva  clear, no obvious abnormalities on inspection of external nose and ears NECK: no obvious masses on inspection palpation  LUNGS: Upper airway sounds large airway sounds noted breath sounds equal no retractions. CV: HRRR, no clubbing cyanosis or  peripheral edema nl cap refill  MS: moves all extremities left lower extremity shin area bandaged Telfa pad mild erythema. PSYCH: pleasant and cooperative, no obvious depression or anxiety normal speech and conversation. Wt Readings from Last 3 Encounters:  01/27/16 102 lb (46.3 kg)  01/15/16 101 lb (45.8 kg)  12/19/15 98 lb 12.8 oz (44.8 kg)    BP Readings from Last 3 Encounters:  01/27/16 (!) 144/76  01/15/16 (!) 142/54  12/19/15 122/78   Lab Results  Component Value Date   WBC 11.5 (H) 11/18/2015   HGB 12.8 11/18/2015   HCT 40.0 11/18/2015   PLT 133 (L) 11/18/2015   GLUCOSE 78 11/26/2015   CHOL 160 08/02/2014   TRIG 57.0 08/02/2014   HDL 64.70 08/02/2014   LDLCALC 84 08/02/2014   ALT 19 11/12/2015   AST 28 11/12/2015   NA 139 11/26/2015   K 5.4 (H) 11/26/2015   CL 104 11/26/2015   CREATININE 1.34 (H) 11/26/2015  BUN 45 (H) 11/26/2015   CO2 20 11/26/2015   TSH 2.04 08/02/2014   INR 1.04 09/20/2010   HGBA1C 5.5 11/14/2015     ASSESSMENT AND PLAN:  Discussed the following assessment and plan:  Underweight  Advanced age  DNR (do not resuscitate) discussion - see text   Protein-calorie malnutrition, unspecified severity ([place])  Tobacco use disorder  HX: breast cancer  Atherosclerosis  Weight is better    Discussion she has advanced directives is interested in the home DO NOT RESUSCITATE  discussed with her implications reviewed form filled out completed given to patient to put in a easily viewed area and tell her family about it also. Some Telfa bandages given discussed nutrition lifestyles decision. Okay to remain off of the alendronate and she is not interested in a bone density. 6 month fu dr Stanford Breed  Health Maintenance Due  Topic Date Due  . ZOSTAVAX  11/27/1989    -Patient advised to return or notify health care team  if symptoms worsen ,persist or new concerns arise.  Patient Instructions   Pay attention to nutrition .  As discussed .  Keep fu appts with your specialists .     ROV 4-6 months . Or as needed    Standley Brooking. Andruw Battie M.D.

## 2016-02-24 DIAGNOSIS — Z79891 Long term (current) use of opiate analgesic: Secondary | ICD-10-CM | POA: Diagnosis not present

## 2016-02-24 DIAGNOSIS — I10 Essential (primary) hypertension: Secondary | ICD-10-CM | POA: Diagnosis not present

## 2016-02-24 DIAGNOSIS — I209 Angina pectoris, unspecified: Secondary | ICD-10-CM | POA: Diagnosis not present

## 2016-02-24 DIAGNOSIS — R69 Illness, unspecified: Secondary | ICD-10-CM | POA: Diagnosis not present

## 2016-02-24 DIAGNOSIS — Z7982 Long term (current) use of aspirin: Secondary | ICD-10-CM | POA: Diagnosis not present

## 2016-02-24 DIAGNOSIS — M47816 Spondylosis without myelopathy or radiculopathy, lumbar region: Secondary | ICD-10-CM | POA: Diagnosis not present

## 2016-02-24 DIAGNOSIS — J449 Chronic obstructive pulmonary disease, unspecified: Secondary | ICD-10-CM | POA: Diagnosis not present

## 2016-02-24 DIAGNOSIS — Z681 Body mass index (BMI) 19 or less, adult: Secondary | ICD-10-CM | POA: Diagnosis not present

## 2016-02-24 DIAGNOSIS — Z0000 Encounter for general adult medical examination without abnormal findings: Secondary | ICD-10-CM | POA: Diagnosis not present

## 2016-02-24 DIAGNOSIS — Z8701 Personal history of pneumonia (recurrent): Secondary | ICD-10-CM | POA: Diagnosis not present

## 2016-03-02 ENCOUNTER — Telehealth: Payer: Self-pay | Admitting: Pulmonary Disease

## 2016-03-02 MED ORDER — LEVOFLOXACIN 500 MG PO TABS: 500.0000 mg | ORAL_TABLET | Freq: Every day | ORAL | 0 refills | Status: DC

## 2016-03-02 NOTE — Telephone Encounter (Signed)
Levaquin 500 daily for 7 days Would prefer if she gets seen in the office -okay to double booked me or see TP

## 2016-03-02 NOTE — Telephone Encounter (Signed)
Called and spoke to pt. Informed her of the recs per RA. Pt states there is no way she could come in, saying she is too weak. Rx sent to preferred pharmacy and advised the pt to contact us she begins to feel worse or not improve. Pt verbalized understanding and denied any further questions or concerns at this time.   Will send to RA as FYI.

## 2016-03-02 NOTE — Telephone Encounter (Signed)
Pt scheduled with TP Thursday 03/04/16 at 2:15. Pt states that she does not feel that she will be able to make it today or tomorrow. Nothing further needed.

## 2016-03-02 NOTE — Telephone Encounter (Signed)
Pt c/o flu-like symptoms - Cough, congestion with yellow mucus production, 101 fever,  increased SOB and chest tightness. Pt is very congested sounding on the phone and is hoarse. Pt had a very hard time talking on the phone d/t all the coughing and SOB. Using nebulizer meds as directed - Seem to help a lot to bring up phlegm and relieve some chest tightness. Pt requesting an antibiotic. Pt states that her current symptoms feel different than when she had Pneumonia in October 2017. Pt not sure if she needs to be seen or just have something called in.   [facility]  Please advise Dr Elsworth Soho. Thanks.

## 2016-03-02 NOTE — Telephone Encounter (Signed)
Okay-please offer her appointment during the week she feels better enough to come in Stay hydrated with plenty of oral fluids Tylenol for fever

## 2016-03-04 ENCOUNTER — Ambulatory Visit (INDEPENDENT_AMBULATORY_CARE_PROVIDER_SITE_OTHER): Payer: Medicare HMO | Admitting: Adult Health

## 2016-03-04 ENCOUNTER — Encounter: Payer: Self-pay | Admitting: Adult Health

## 2016-03-04 DIAGNOSIS — J441 Chronic obstructive pulmonary disease with (acute) exacerbation: Secondary | ICD-10-CM

## 2016-03-04 MED ORDER — PREDNISONE 10 MG PO TABS: ORAL_TABLET | ORAL | 0 refills | Status: DC

## 2016-03-04 NOTE — Patient Instructions (Signed)
Finish Levaquin .  Mucinex DM Twice daily As needed  Cough/congestion  Prednisone taper over next week.  Increase diet as tolerated.  Follow up Dr. Elsworth Soho  In 3 months and As needed   Please contact office for sooner follow up if symptoms do not improve or worsen or seek emergency care

## 2016-03-04 NOTE — Progress Notes (Signed)
@Patient  ID: [name] [name], female    DOB: [birth year], 83 y.o.   MRN: [id number]  Chief Complaint  Patient presents with  . Acute Visit    COPD/Cough     Referring provider: Burnis Medin, MD  HPI: 83 yo female smoker with COPD GOLD II w/ recurrent LLL atx .   07/2010 >>Spirometry >>FEV1 61%  She was first admitted 07/2010 for hypoxia & LLL atelectasis.  Ct chest showed Volume loss in the inferior left hemithorax with filling defect of the left lower and less so left upper lobe  Bronchoscopy showed LLL endobronchial narrowing, without discrete mass. Papule on rt mainstem bronchus was brushed - cytology all neg. BAL cx, afb, fungal neg  Re- admitted 09/2010 for pneumonia & LLL atx. Rpt bscopy >>thick mucoid secretions with difficulty passing scope. Cytology was neg for malignancy . Path showed AMYLOID DEPOSITION AND DYSTROPHIC CALCIFICATIONS.  CT sinuses 09/2010 neg  CT 06/2011.>>No residual lymphadenopathy. No evidence of metastatic breast cancer. Stable chronic lung disease with biapical scarring, diffuse calcifications of the tracheobronchial tree and mucous impaction of the lingular bronchus  Living will/DNR    03/04/2016 Acute OV : COPD  Pt presents for an acute office visit . Complains of 1 week of cough , congestion , thick yellow green mucus and wheezing . She was called in levaquin on day 3/7 . She is starting to feel better.  Cough and congestion are decreasing . She still has lingering wheezing.  Appetite is down but she is eating some and drinking ensure. No fever or hemoptysis   Allergies  Allergen Reactions  . Statins Other (See Comments)    Elevated liver enzymes  . Oxycodone Anxiety    Also states, "It makes me feel like a zombie"  . Albuterol     Shakes   . Hydrocodone-Acetaminophen Nausea Only    Dizziness  . Tequin Other (See Comments)    Patient doesn't recall    Immunization History  Administered Date(s) Administered  . H1N1 04/05/2008    . Influenza Split 10/21/2011  . Influenza Whole 11/21/2006, 11/07/2007, 12/16/2008, 10/13/2009, 10/12/2010  . Influenza, High Dose Seasonal PF 10/11/2014  . Influenza,inj,Quad PF,36+ Mos 10/24/2012, 10/03/2013  . Influenza-Unspecified 10/27/2015  . Pneumococcal Conjugate-13 11/05/2014, 11/18/2015  . Pneumococcal Polysaccharide-23 12/14/2010  . Tdap 10/12/2010    Past Medical History:  Diagnosis Date  . CARCINOMA, BASAL CELL 02/13/2006  . CAROTID ARTERY DISEASE 10/03/2006   Carotid US 11/17: Stable 40-59% bilateral ICA stenosis >> f/u 1 year  . CHRONIC OBSTRUCTIVE PULMONARY DISEASE, ACUTE EXACERBATION 11/17/2009  . Chronic rhinitis 12/13/2006  . CORONARY ARTERY DISEASE 02/13/2006  . DEGENERATIVE JOINT DISEASE 09/27/2006  . DERMATITIS 09/25/2007  . Head trauma    closed  . HYPERLIPIDEMIA 03/10/2008  . HYPERTENSION 02/13/2006  . HYPOTHYROIDISM 01/30/2007  . LIVER FUNCTION TESTS, ABNORMAL 01/22/2009  . LOSS, HEARING NOS 08/25/2006  . Near syncope 09/18/2014  . NEOPLASM, SKIN, UNCERTAIN BEHAVIOR 99991111  . NEPHROLITHIASIS, HX OF 02/27/2010  . OSTEOARTHRITIS, HAND 12/16/2008  . PERIPHERAL VASCULAR DISEASE 02/13/2006  . Personal history of malignant neoplasm of breast 07/24/2008  . Personal history of urinary calculi   . Post-traumatic wound infection 12/01/2010   Mild streaking superiorly and on one day of septra  Will give rocephin and add keflex  adn follow up   . POSTHERPETIC NEURALGIA 08/25/2006  . RENAL ARTERY STENOSIS 08/01/2008  . Shingles    recurrent  . SPINAL STENOSIS 11/01/2006  . THROMBOCYTOPENIA 07/30/2008  .  UNS ADVRS EFF UNS RX MEDICINAL&BIOLOGICAL SBSTNC 01/30/2007  . UNSPECIFIED ANEMIA 03/18/2008  . Unspecified vitamin D deficiency 02/07/2007  . UNSTABLE ANGINA 12/12/2009  . Vitreous detachment     Tobacco History: History  Smoking Status  . Former Smoker  . Packs/day: 1.00  . Years: 30.00  . Types: Cigarettes  . Quit date: 06/15/2010  Smokeless Tobacco  . Never  Used    Comment: 2-3 cigs per week/// 06/30/12   Counseling given: Not Answered   Outpatient Encounter Prescriptions as of 03/04/2016  Medication Sig  . amLODipine (NORVASC) 10 MG tablet Take 1 tablet (10 mg total) by mouth daily.  Marland Kitchen aspirin 81 MG tablet Take 81 mg by mouth daily.  . carboxymethylcellulose (REFRESH PLUS) 0.5 % SOLN Place 1 drop into both eyes 4 (four) times daily.  . Cholecalciferol (VITAMIN D) 1000 UNITS capsule Take 1,000 Units by mouth daily.    . fish oil-omega-3 fatty acids 1000 MG capsule Take 1 g by mouth daily.   . isosorbide mononitrate (IMDUR) 30 MG 24 hr tablet Take 1 tablet (30 mg total) by mouth daily.  Marland Kitchen levalbuterol (XOPENEX HFA) 45 MCG/ACT inhaler INHALE 2 PUFFS BY MOUTH EVERY 6 HOURS AS NEEDED FOR WHEEZING  . levalbuterol (XOPENEX) 0.63 MG/3ML nebulizer solution Take 0.63 mg by nebulization every 4 (four) hours as needed for wheezing or shortness of breath.  . levofloxacin (LEVAQUIN) 500 MG tablet Take 1 tablet (500 mg total) by mouth daily.  . metoprolol succinate (TOPROL-XL) 25 MG 24 hr tablet Take 1 tablet (25 mg total) by mouth daily.  Marland Kitchen SPIRIVA HANDIHALER 18 MCG inhalation capsule INHALE THE CONTENTS OF 1 CAPSULE VIA INHALATION DEVICE EVERY DAY  . traMADol (ULTRAM) 50 MG tablet TAKE ONE TABLET BY MOUTH EVERY EIGHT HOURS AS NEEDED FOR PAIN  . nitroGLYCERIN (NITROSTAT) 0.4 MG SL tablet Place 1 tablet (0.4 mg total) under the tongue every 5 (five) minutes as needed for chest pain.  . predniSONE (DELTASONE) 10 MG tablet 4 tabs for 2 days, then 3 tabs for 2 days, 2 tabs for 2 days, then 1 tab for 2 days, then stop   No facility-administered encounter medications on file as of 03/04/2016.      Review of Systems  Constitutional:   No  weight loss, night sweats,  Fevers, chills, fatigue, or  lassitude.  HEENT:   No headaches,  Difficulty swallowing,  Tooth/dental problems, or  Sore throat,                No sneezing, itching, ear ache,  +nasal congestion,  post nasal drip,   CV:  No chest pain,  Orthopnea, PND, swelling in lower extremities, anasarca, dizziness, palpitations, syncope.   GI  No heartburn, indigestion, abdominal pain, nausea, vomiting, diarrhea, change in bowel habits, loss of appetite, bloody stools.   Resp:   No wheezing.  No chest wall deformity  Skin: no rash or lesions.  GU: no dysuria, change in color of urine, no urgency or frequency.  No flank pain, no hematuria   MS:  No joint pain or swelling.  No decreased range of motion.  No back pain.    Physical Exam  BP 108/62 (BP Location: Left Arm, Cuff Size: Normal)   Pulse 73   Temp 98.3 F (36.8 C) (Oral)   Ht 5\' 4"  (1.626 m)   Wt 98 lb 9.6 oz (44.7 kg)   SpO2 95%   BMI 16.92 kg/m   GEN: A/Ox3; pleasant , NAD, thin  and frail    HEENT:  [place]/AT,  EACs-clear, TMs-wnl, NOSE-clear drainage , THROAT-clear, no lesions, no postnasal drip or exudate noted.   NECK:  Supple w/ fair ROM; no JVD; normal carotid impulses w/o bruits; no thyromegaly or nodules palpated; no lymphadenopathy.    RESP  Scattered rhonchi,  no accessory muscle use, no dullness to percussion  CARD:  RRR, no m/r/g, no peripheral edema, pulses intact, no cyanosis or clubbing.  GI:   Soft & nt; nml bowel sounds; no organomegaly or masses detected.   Musco: Warm bil, no deformities or joint swelling noted.   Neuro: alert, no focal deficits noted.    Skin: Warm, no lesions or rashes  Psych:  No change in mood or affect. No depression or anxiety.  No memory loss.  Lab Results:    Imaging: No results found.   Assessment & Plan:   COPD (chronic obstructive pulmonary disease) Exacerbation with bronchitis -improving on abx  Smoking cessation  She has recurrent LLL atx -cont on mucolytics. Recommended CXR , pt declined last ov , will discuss on return and consider follow up CT chest to follow abn areas (dating back to 2013)   Plan  Patient Instructions  Finish Levaquin .  Mucinex DM  Twice daily As needed  Cough/congestion  Prednisone taper over next week.  Increase diet as tolerated.  Follow up Dr. Elsworth Soho  In 3 months and As needed   Please contact office for sooner follow up if symptoms do not improve or worsen or seek emergency care          Rexene Edison, NP 03/04/2016

## 2016-03-04 NOTE — Assessment & Plan Note (Addendum)
Exacerbation with bronchitis -improving on abx  Smoking cessation  She has recurrent LLL atx -cont on mucolytics. Recommended CXR , pt declined last ov , will discuss on return and consider follow up CT chest to follow abn areas (dating back to 2013)   Plan  Patient Instructions  Finish Levaquin .  Mucinex DM Twice daily As needed  Cough/congestion  Prednisone taper over next week.  Increase diet as tolerated.  Follow up Dr. Elsworth Soho  In 3 months and As needed   Please contact office for sooner follow up if symptoms do not improve or worsen or seek emergency care

## 2016-03-05 NOTE — Progress Notes (Signed)
Reviewed & agree with plan  

## 2016-04-01 ENCOUNTER — Emergency Department (HOSPITAL_COMMUNITY): Payer: MEDICARE

## 2016-04-01 ENCOUNTER — Encounter (HOSPITAL_COMMUNITY): Payer: Self-pay

## 2016-04-01 ENCOUNTER — Inpatient Hospital Stay (HOSPITAL_COMMUNITY)
Admission: EM | Admit: 2016-04-01 | Discharge: 2016-04-05 | DRG: 536 | Disposition: A | Discharge status: Home Health | Payer: MEDICARE | Attending: Nephrology | Admitting: Nephrology

## 2016-04-01 DIAGNOSIS — S32810A Multiple fractures of pelvis with stable disruption of pelvic ring, initial encounter for closed fracture: Secondary | ICD-10-CM

## 2016-04-01 DIAGNOSIS — Z955 Presence of coronary angioplasty implant and graft: Secondary | ICD-10-CM | POA: Diagnosis not present

## 2016-04-01 DIAGNOSIS — Z79899 Other long term (current) drug therapy: Secondary | ICD-10-CM

## 2016-04-01 DIAGNOSIS — W108XXA Fall (on) (from) other stairs and steps, initial encounter: Secondary | ICD-10-CM | POA: Diagnosis present

## 2016-04-01 DIAGNOSIS — Y92008 Other place in unspecified non-institutional (private) residence as the place of occurrence of the external cause: Secondary | ICD-10-CM | POA: Diagnosis not present

## 2016-04-01 DIAGNOSIS — R001 Bradycardia, unspecified: Secondary | ICD-10-CM | POA: Diagnosis not present

## 2016-04-01 DIAGNOSIS — Z7982 Long term (current) use of aspirin: Secondary | ICD-10-CM | POA: Diagnosis not present

## 2016-04-01 DIAGNOSIS — I2510 Atherosclerotic heart disease of native coronary artery without angina pectoris: Secondary | ICD-10-CM | POA: Diagnosis not present

## 2016-04-01 DIAGNOSIS — S3993XA Unspecified injury of pelvis, initial encounter: Secondary | ICD-10-CM | POA: Diagnosis not present

## 2016-04-01 DIAGNOSIS — M25571 Pain in right ankle and joints of right foot: Secondary | ICD-10-CM | POA: Diagnosis not present

## 2016-04-01 DIAGNOSIS — S3282XA Multiple fractures of pelvis without disruption of pelvic ring, initial encounter for closed fracture: Secondary | ICD-10-CM | POA: Diagnosis not present

## 2016-04-01 DIAGNOSIS — F17200 Nicotine dependence, unspecified, uncomplicated: Secondary | ICD-10-CM | POA: Diagnosis present

## 2016-04-01 DIAGNOSIS — S329XXA Fracture of unspecified parts of lumbosacral spine and pelvis, initial encounter for closed fracture: Secondary | ICD-10-CM

## 2016-04-01 DIAGNOSIS — M25551 Pain in right hip: Secondary | ICD-10-CM | POA: Diagnosis not present

## 2016-04-01 DIAGNOSIS — I10 Essential (primary) hypertension: Secondary | ICD-10-CM | POA: Diagnosis not present

## 2016-04-01 DIAGNOSIS — I739 Peripheral vascular disease, unspecified: Secondary | ICD-10-CM | POA: Diagnosis not present

## 2016-04-01 DIAGNOSIS — Z87442 Personal history of urinary calculi: Secondary | ICD-10-CM | POA: Diagnosis not present

## 2016-04-01 DIAGNOSIS — F17210 Nicotine dependence, cigarettes, uncomplicated: Secondary | ICD-10-CM | POA: Diagnosis not present

## 2016-04-01 DIAGNOSIS — S79911A Unspecified injury of right hip, initial encounter: Secondary | ICD-10-CM | POA: Diagnosis not present

## 2016-04-01 DIAGNOSIS — IMO0002 Reserved for concepts with insufficient information to code with codable children: Secondary | ICD-10-CM

## 2016-04-01 DIAGNOSIS — Z853 Personal history of malignant neoplasm of breast: Secondary | ICD-10-CM | POA: Diagnosis not present

## 2016-04-01 DIAGNOSIS — J449 Chronic obstructive pulmonary disease, unspecified: Secondary | ICD-10-CM | POA: Diagnosis not present

## 2016-04-01 DIAGNOSIS — W19XXXA Unspecified fall, initial encounter: Secondary | ICD-10-CM | POA: Diagnosis present

## 2016-04-01 DIAGNOSIS — M79601 Pain in right arm: Secondary | ICD-10-CM | POA: Diagnosis not present

## 2016-04-01 DIAGNOSIS — Z803 Family history of malignant neoplasm of breast: Secondary | ICD-10-CM

## 2016-04-01 DIAGNOSIS — E785 Hyperlipidemia, unspecified: Secondary | ICD-10-CM | POA: Diagnosis present

## 2016-04-01 DIAGNOSIS — E039 Hypothyroidism, unspecified: Secondary | ICD-10-CM | POA: Diagnosis present

## 2016-04-01 DIAGNOSIS — S3210XA Unspecified fracture of sacrum, initial encounter for closed fracture: Secondary | ICD-10-CM | POA: Diagnosis not present

## 2016-04-01 DIAGNOSIS — D696 Thrombocytopenia, unspecified: Secondary | ICD-10-CM | POA: Diagnosis not present

## 2016-04-01 DIAGNOSIS — R69 Illness, unspecified: Secondary | ICD-10-CM | POA: Diagnosis not present

## 2016-04-01 DIAGNOSIS — S50811A Abrasion of right forearm, initial encounter: Secondary | ICD-10-CM | POA: Diagnosis not present

## 2016-04-01 LAB — BASIC METABOLIC PANEL
ANION GAP: 7 (ref 5–15)
BUN: 17 mg/dL (ref 6–20)
CALCIUM: 9 mg/dL (ref 8.9–10.3)
CO2: 26 mmol/L (ref 22–32)
Chloride: 107 mmol/L (ref 101–111)
Creatinine, Ser: 0.96 mg/dL (ref 0.44–1.00)
GFR, EST NON AFRICAN AMERICAN: 52 mL/min — AB (ref >60)
Glucose, Bld: 97 mg/dL (ref 65–99)
POTASSIUM: 3.5 mmol/L (ref 3.5–5.1)
Sodium: 140 mmol/L (ref 135–145)

## 2016-04-01 LAB — CBC WITH DIFFERENTIAL/PLATELET
BASOS ABS: 0 10*3/uL (ref 0.0–0.1)
BASOS PCT: 0 %
Eosinophils Absolute: 0.2 10*3/uL (ref 0.0–0.7)
Eosinophils Relative: 2 %
HEMATOCRIT: 38 % (ref 36.0–46.0)
Hemoglobin: 12.2 g/dL (ref 12.0–15.0)
LYMPHS PCT: 15 %
Lymphs Abs: 1.2 10*3/uL (ref 0.7–4.0)
MCH: 29.5 pg (ref 26.0–34.0)
MCHC: 32.1 g/dL (ref 30.0–36.0)
MCV: 92 fL (ref 78.0–100.0)
MONO ABS: 1 10*3/uL (ref 0.1–1.0)
Monocytes Relative: 12 %
NEUTROS ABS: 5.6 10*3/uL (ref 1.7–7.7)
Neutrophils Relative %: 71 %
PLATELETS: 126 10*3/uL — AB (ref 150–400)
RBC: 4.13 MIL/uL (ref 3.87–5.11)
RDW: 14.7 % (ref 11.5–15.5)
WBC: 7.9 10*3/uL (ref 4.0–10.5)

## 2016-04-01 MED ORDER — POLYVINYL ALCOHOL 1.4 % OP SOLN
1.0000 [drp] | Freq: Three times a day (TID) | OPHTHALMIC | Status: DC
  Administered 2016-04-02 – 2016-04-05 (×13): 1 [drp] via OPHTHALMIC
  Filled 2016-04-01: qty 15

## 2016-04-01 MED ORDER — MORPHINE SULFATE (PF) 4 MG/ML IV SOLN
2.0000 mg | INTRAVENOUS | Status: DC | PRN
  Administered 2016-04-01 – 2016-04-04 (×8): 2 mg via INTRAVENOUS
  Filled 2016-04-01 (×8): qty 1

## 2016-04-01 MED ORDER — ASPIRIN EC 81 MG PO TBEC
81.0000 mg | DELAYED_RELEASE_TABLET | Freq: Every day | ORAL | Status: DC
  Administered 2016-04-02 – 2016-04-05 (×4): 81 mg via ORAL
  Filled 2016-04-01 (×4): qty 1

## 2016-04-01 MED ORDER — LEVALBUTEROL HCL 0.63 MG/3ML IN NEBU
0.6300 mg | INHALATION_SOLUTION | RESPIRATORY_TRACT | Status: DC | PRN
  Administered 2016-04-03: 0.63 mg via RESPIRATORY_TRACT
  Filled 2016-04-01: qty 3

## 2016-04-01 MED ORDER — MORPHINE SULFATE (PF) 4 MG/ML IV SOLN
4.0000 mg | Freq: Once | INTRAVENOUS | Status: AC
  Administered 2016-04-01: 4 mg via INTRAVENOUS
  Filled 2016-04-01: qty 1

## 2016-04-01 MED ORDER — AMLODIPINE BESYLATE 10 MG PO TABS
10.0000 mg | ORAL_TABLET | Freq: Every day | ORAL | Status: DC
  Administered 2016-04-02 – 2016-04-05 (×4): 10 mg via ORAL
  Filled 2016-04-01 (×4): qty 1

## 2016-04-01 MED ORDER — MORPHINE SULFATE (PF) 4 MG/ML IV SOLN
INTRAVENOUS | Status: AC
  Administered 2016-04-03: 2 mg via INTRAVENOUS
  Filled 2016-04-01: qty 1

## 2016-04-01 MED ORDER — TIOTROPIUM BROMIDE MONOHYDRATE 18 MCG IN CAPS
18.0000 ug | ORAL_CAPSULE | Freq: Every day | RESPIRATORY_TRACT | Status: DC
  Administered 2016-04-03 – 2016-04-05 (×3): 18 ug via RESPIRATORY_TRACT
  Filled 2016-04-01 (×2): qty 5

## 2016-04-01 MED ORDER — METOPROLOL SUCCINATE ER 25 MG PO TB24
25.0000 mg | ORAL_TABLET | Freq: Every day | ORAL | Status: DC
Start: 2016-04-02 — End: 2016-04-05
  Administered 2016-04-02 – 2016-04-05 (×4): 25 mg via ORAL
  Filled 2016-04-01 (×4): qty 1

## 2016-04-01 MED ORDER — HYDROCODONE-ACETAMINOPHEN 5-325 MG PO TABS
1.0000 | ORAL_TABLET | Freq: Four times a day (QID) | ORAL | Status: DC | PRN
  Administered 2016-04-02: 2 via ORAL
  Filled 2016-04-01: qty 2

## 2016-04-01 MED ORDER — ENOXAPARIN SODIUM 30 MG/0.3ML ~~LOC~~ SOLN
30.0000 mg | Freq: Every day | SUBCUTANEOUS | Status: DC
  Administered 2016-04-02 – 2016-04-05 (×4): 30 mg via SUBCUTANEOUS
  Filled 2016-04-01 (×4): qty 0.3

## 2016-04-01 MED ORDER — SENNA 8.6 MG PO TABS
1.0000 | ORAL_TABLET | Freq: Two times a day (BID) | ORAL | Status: DC
  Administered 2016-04-02 – 2016-04-05 (×8): 8.6 mg via ORAL
  Filled 2016-04-01 (×8): qty 1

## 2016-04-01 NOTE — ED Provider Notes (Signed)
[facility] DEPT Provider Note   CSN: [id number] Arrival date & time: 04/01/16  1351   History   Chief Complaint Chief Complaint  Patient presents with  . Fall    HPI [name] [name] is a 83 y.o. female who presents with right hip pain, right arm pain, right ankle pain after a mechanical fall 2 days ago. She states that she was going to walk up stairs when she lost her balance and fell backwards. She landed on her buttocks and felt immediate onset of pain. She was able to stand up after the incident but then needed a walker afterwards. She's not been able to bear weight on that side since the injury. She also reports an injury of her forearm and ankle. She reports mild bruising and skin tears to the areas as well. She's had some recent shortness of breath and had to go on a a course of prednisone and antibiotics for a COPD exacerbation. She is a current smoker. She denies any fever, chills, syncope, chest pain, abdominal pain, left-sided pain, weakness, numbness or tingling. She is not on blood thinners.  HPI  Past Medical History:  Diagnosis Date  . CARCINOMA, BASAL CELL 02/13/2006  . CAROTID ARTERY DISEASE 10/03/2006   Carotid US 11/17: Stable 40-59% bilateral ICA stenosis >> f/u 1 year  . CHRONIC OBSTRUCTIVE PULMONARY DISEASE, ACUTE EXACERBATION 11/17/2009  . Chronic rhinitis 12/13/2006  . CORONARY ARTERY DISEASE 02/13/2006  . DEGENERATIVE JOINT DISEASE 09/27/2006  . DERMATITIS 09/25/2007  . Head trauma    closed  . HYPERLIPIDEMIA 03/10/2008  . HYPERTENSION 02/13/2006  . HYPOTHYROIDISM 01/30/2007  . LIVER FUNCTION TESTS, ABNORMAL 01/22/2009  . LOSS, HEARING NOS 08/25/2006  . Near syncope 09/18/2014  . NEOPLASM, SKIN, UNCERTAIN BEHAVIOR [id number]  . NEPHROLITHIASIS, HX OF 02/27/2010  . OSTEOARTHRITIS, HAND 12/16/2008  . PERIPHERAL VASCULAR DISEASE 02/13/2006  . Personal history of malignant neoplasm of breast 07/24/2008  . Personal history of urinary calculi   .  Post-traumatic wound infection 12/01/2010   Mild streaking superiorly and on one day of septra  Will give rocephin and add keflex  adn follow up   . POSTHERPETIC NEURALGIA 08/25/2006  . RENAL ARTERY STENOSIS 08/01/2008  . Shingles    recurrent  . SPINAL STENOSIS 11/01/2006  . THROMBOCYTOPENIA 07/30/2008  . UNS ADVRS EFF UNS RX MEDICINAL&BIOLOGICAL SBSTNC 01/30/2007  . UNSPECIFIED ANEMIA 03/18/2008  . Unspecified vitamin D deficiency 02/07/2007  . UNSTABLE ANGINA 12/12/2009  . Vitreous detachment     Patient Active Problem List   Diagnosis Date Noted  . CAP (community acquired pneumonia) 12/19/2015  . Advanced age 59/26/2017  . Protein-calorie malnutrition ([place]) 11/27/2015  . Frailty 11/27/2015  . Protein-calorie malnutrition, severe 11/15/2015  . COPD exacerbation ([place]) 11/12/2015  . Acute respiratory failure with hypoxia ([place]) 11/12/2015  . CAD (coronary artery disease), native coronary artery 11/12/2015  . Statin intolerance 10/11/2014  . Headache disorder 09/18/2014  . Laceration of right lower leg with complication [id number]  . Post-traumatic wound infection 08/27/2013  . Rash and nonspecific skin eruption 11/09/2012  . Pain of right breast 08/15/2012  . Urinary frequency 08/15/2012  . HX: breast cancer 08/15/2012  . TOBACCO USE 04/21/2012  . Arthritis 05/31/2011  . Pain of left heel 05/31/2011  . Infected cyst of skin 12/29/2010  . Leg cramps 12/12/2010  . Glossitis 11/26/2010  . Thrombocytopenia ([place]) 07/21/2010  . Traumatic ecchymosis of lower leg 07/21/2010  . Atelectasis 07/16/2010  . Tinnitus 06/04/2010  . NEPHROLITHIASIS, HX OF  02/27/2010  . HIP PAIN 02/26/2010  . FEMORAL BRUIT, RIGHT 01/02/2010  . UNSTABLE ANGINA 12/12/2009  . Essential hypertension, benign 06/03/2009  . LIVER FUNCTION TESTS, ABNORMAL 01/22/2009  . NEOPLASM, SKIN, UNCERTAIN BEHAVIOR 99991111  . OSTEOARTHRITIS, HAND 12/16/2008  . UNSPECIFIED DISORDER OF KIDNEY AND URETER 09/17/2008  . RENAL  ARTERY STENOSIS 08/01/2008  . THROMBOCYTOPENIA 07/30/2008  . PERSONAL HISTORY OF MALIGNANT NEOPLASM OF BREAST 07/24/2008  . WEIGHT LOSS 06/11/2008  . GROIN PAIN 06/11/2008  . INSOMNIA 04/05/2008  . UNSPECIFIED ANEMIA 03/18/2008  . HYPERLIPIDEMIA 03/10/2008  . UNDERWEIGHT 01/17/2008  . DERMATITIS 09/25/2007  . UNSPECIFIED VITAMIN D DEFICIENCY 02/07/2007  . HYPOTHYROIDISM 01/30/2007  . CHRONIC RHINITIS 12/13/2006  . COPD (chronic obstructive pulmonary disease) ([place]) 11/21/2006  . SPINAL STENOSIS 11/01/2006  . Carotid arterial disease ([place]) 10/03/2006  . DEGENERATIVE JOINT DISEASE 09/27/2006  . LOSS, HEARING NOS 08/25/2006  . TMJ PAIN 08/25/2006  . CARCINOMA, BASAL CELL 02/13/2006  . Coronary atherosclerosis 02/13/2006  . Peripheral vascular disease ([place]) 02/13/2006  . HEMATURIA, MICROSCOPIC, HX OF 02/13/2006    Past Surgical History:  Procedure Laterality Date  . ABDOMINAL HYSTERECTOMY    . BREAST LUMPECTOMY    . BREAST LUMPECTOMY  1998  . CATARACT EXTRACTION    . CORONARY ANGIOPLASTY WITH STENT PLACEMENT     bilat iliac stents, left renal artery and rt coronary artery last myoview 8/09 with EF 70%  . LUMBAR LAMINECTOMY    . SKIN CANCER EXCISION    . TONSILLECTOMY      OB History    No data available       Home Medications    Prior to Admission medications   Medication Sig Start Date End Date Taking? Authorizing Provider  amLODipine (NORVASC) 10 MG tablet Take 1 tablet (10 mg total) by mouth daily. 11/10/15   Lelon Perla, MD  aspirin 81 MG tablet Take 81 mg by mouth daily.    Historical Provider, MD  carboxymethylcellulose (REFRESH PLUS) 0.5 % SOLN Place 1 drop into both eyes 4 (four) times daily.    Historical Provider, MD  Cholecalciferol (VITAMIN D) 1000 UNITS capsule Take 1,000 Units by mouth daily.      Historical Provider, MD  fish oil-omega-3 fatty acids 1000 MG capsule Take 1 g by mouth daily.     Historical Provider, MD  isosorbide mononitrate (IMDUR)  30 MG 24 hr tablet Take 1 tablet (30 mg total) by mouth daily. 10/31/15   Lelon Perla, MD  levalbuterol (XOPENEX HFA) 45 MCG/ACT inhaler INHALE 2 PUFFS BY MOUTH EVERY 6 HOURS AS NEEDED FOR WHEEZING 10/15/15   Rigoberto Noel, MD  levalbuterol (XOPENEX) 0.63 MG/3ML nebulizer solution Take 0.63 mg by nebulization every 4 (four) hours as needed for wheezing or shortness of breath.    Historical Provider, MD  levofloxacin (LEVAQUIN) 500 MG tablet Take 1 tablet (500 mg total) by mouth daily. 03/02/16   Rigoberto Noel, MD  metoprolol succinate (TOPROL-XL) 25 MG 24 hr tablet Take 1 tablet (25 mg total) by mouth daily. 10/31/15   Lelon Perla, MD  nitroGLYCERIN (NITROSTAT) 0.4 MG SL tablet Place 1 tablet (0.4 mg total) under the tongue every 5 (five) minutes as needed for chest pain. 11/03/15 02/01/16  Sherren Mocha, MD  predniSONE (DELTASONE) 10 MG tablet 4 tabs for 2 days, then 3 tabs for 2 days, 2 tabs for 2 days, then 1 tab for 2 days, then stop 03/04/16   Melvenia Needles, NP  SPIRIVA HANDIHALER 18 MCG inhalation capsule INHALE THE CONTENTS OF 1 CAPSULE VIA INHALATION DEVICE EVERY DAY 12/24/15   Rigoberto Noel, MD  traMADol (ULTRAM) 50 MG tablet TAKE ONE TABLET BY MOUTH EVERY EIGHT HOURS AS NEEDED FOR PAIN 06/28/12   Burnis Medin, MD    Family History Family History  Problem Relation Age of Onset  . Breast cancer Daughter   . Tuberculosis Father     Social History Social History  Substance Use Topics  . Smoking status: Former Smoker    Packs/day: 1.00    Years: 30.00    Types: Cigarettes    Quit date: 06/15/2010  . Smokeless tobacco: Never Used     Comment: 2-3 cigs per week/// 06/30/12  . Alcohol use No     Comment: socially     Allergies   Statins; Oxycodone; Albuterol; Hydrocodone-acetaminophen; and Tequin   Review of Systems Review of Systems  Constitutional: Negative for chills and fever.  Respiratory: Positive for shortness of breath.   Cardiovascular: Negative for chest  pain.  Gastrointestinal: Negative for abdominal pain.  Musculoskeletal: Positive for arthralgias, gait problem and myalgias. Negative for back pain, joint swelling and neck pain.  Skin: Positive for wound.  Neurological: Negative for syncope, weakness, numbness and headaches.     Physical Exam Updated Vital Signs BP 118/66 (BP Location: Left Arm)   Pulse 80   Temp 99 F (37.2 C) (Oral)   Resp 18   SpO2 97%   Physical Exam  Constitutional: She is oriented to person, place, and time. She appears well-developed and well-nourished. No distress.  Thin elderly female lying in stretcher in NAD  HENT:  Head: Normocephalic and atraumatic.  Eyes: Conjunctivae are normal. Pupils are equal, round, and reactive to light. Right eye exhibits no discharge. Left eye exhibits no discharge. No scleral icterus.  Neck: Normal range of motion.  Cardiovascular: Normal rate and regular rhythm.  Exam reveals no gallop and no friction rub.   No murmur heard. Pulmonary/Chest: Effort normal. No respiratory distress. She has no decreased breath sounds. She has wheezes (scattered). She has rhonchi (scattered). She has no rales. She exhibits no tenderness.  Abdominal: Soft. Bowel sounds are normal. She exhibits no distension and no mass. There is no tenderness. There is no rebound and no guarding. No hernia.  Musculoskeletal:  Right forearm: Small skin tear with ecchymosis. Minimal tenderness. FROM of elbow and wrist  Right hip: Diffuse tenderness with significant tenderness over posterior hip. ROM deferred due to pain. FROM of knee.   Right ankle: Ecchymosis and skin tear over lateral ankle. FROM. N/V intact  Neurological: She is alert and oriented to person, place, and time.  Skin: Skin is warm and dry.  Psychiatric: She has a normal mood and affect. Her behavior is normal.  Nursing note and vitals reviewed.    ED Treatments / Results  Labs (all labs ordered are listed, but only abnormal results are  displayed) Labs Reviewed  BASIC METABOLIC PANEL - Abnormal; Notable for the following:       Result Value   GFR calc non Af Amer 52 (*)    All other components within normal limits  CBC WITH DIFFERENTIAL/PLATELET - Abnormal; Notable for the following:    Platelets 126 (*)    All other components within normal limits    EKG  EKG Interpretation None       Radiology Dg Forearm Right  Result Date: 04/01/2016 CLINICAL DATA:  83 y/o F; status  post fall 2 days ago with abrasions on the anterior right forearm just distal to midshaft. EXAM: RIGHT FOREARM - 2 VIEW COMPARISON:  None. FINDINGS: There is no evidence of fracture or other focal bone lesions. Soft tissues are unremarkable. IMPRESSION: No acute fracture or dislocation identified. Electronically Signed   By: Kristine Garbe M.D.   On: 04/01/2016 15:13   Ct Pelvis Wo Contrast  Result Date: 04/01/2016 CLINICAL DATA:  Fall 3 days ago with known right iliac and sacral fractures, full pelvis CT for further evaluation. EXAM: CT PELVIS WITHOUT CONTRAST TECHNIQUE: Multidetector CT imaging of the pelvis was performed following the standard protocol without intravenous contrast. COMPARISON:  None. FINDINGS: Urinary Tract: Bladder is partially decompressed. No definitive filling defect is seen. Large right renal cyst is noted. It measures approximately 5.1 cm. Bowel:  No obstructive changes are seen. Vascular/Lymphatic: Diffuse aortic calcifications are noted. There are changes consistent with iliac stents bilaterally. Reproductive: The uterus has been surgically removed. No pelvic mass lesion is noted. Other: No significant hematoma is identified. No free pelvic fluid is noted. Musculoskeletal: The previously seen superior and inferior pubic ramus fractures are again identified and stable. The known right iliac bone fracture extending into the sacroiliac joint is again seen with medial displacement of the more anterior portion of the iliac bone.  The fracture extends superiorly in the iliac bone all the way to the lateral aspect of the iliac wing. The more superior component is less displaced than the inferior component. Minimal sacral fracture is noted on the right similar to that seen on the prior exam. No definitive fracture is noted in the more superior aspect of the sacrum. Postsurgical changes are noted in the lower lumbar spine. No other definitive fractures are seen. IMPRESSION: The known right iliac bone extends superiorly into the lateral aspect of the iliac wing but is not significantly displaced superiorly. No new focal fractures are seen. The known the small sacral fracture on the right is noted and unchanged. The pubic ramus fractures are unchanged as well. Electronically Signed   By: Inez Catalina M.D.   On: 04/01/2016 19:39   Ct Hip Right Wo Contrast  Result Date: 04/01/2016 CLINICAL DATA:  Fall at home 3 days ago. Right hip pain and difficulty bearing weight. EXAM: CT OF THE RIGHT HIP WITHOUT CONTRAST TECHNIQUE: Multidetector CT imaging of the right hip was performed according to the standard protocol. Multiplanar CT image reconstructions were also generated. COMPARISON:  04/01/2016 radiographs FINDINGS: Bones/Joint/Cartilage Vertical fracture in the right iliac bone extending into the sacroiliac joint, with a fracture of the anterior margin of the sacrum shown on the top most image number 1/5. Neither the sacral nor the iliac bone fracture are completely included on this CT of the hip which does not include the entire bony pelvis. The upper portion of the right iliac bone is displaced 8 mm medially with respect to the lower portion of the right iliac bone. Mildly comminuted fracture of the inferior pubic ramus extending into the pubic body and adjacent superior pubic ramus, images 229-280 8/5. Ligaments Suboptimally assessed by CT. Muscles and Tendons Unremarkable Soft tissues Presacral edema and aches edema extending in the sciatic  notch. No direct bony impingement on the sciatic nerve is seen. IMPRESSION: 1. Mildly displaced fracture through the right iliac bone extending into the lower part of the right SI joint, and involving part of the adjacent sacrum ; with comminuted fractures of the right superior and inferior pubic ramus extending  into the pubic body. The iliac bone fracture is only partially included on today's exam which was focused on the hip rather than the entire bony pelvis. Based on the overall appearance I expect that this is a [place] type 2 lateral compression injury. It would probably be prudent to re- scan the entire bony pelvis to fully characterize the pelvic fractures. Electronically Signed   By: Van Clines M.D.   On: 04/01/2016 17:07   Dg Chest Port 1 View  Result Date: 04/01/2016 CLINICAL DATA:  Preop. EXAM: PORTABLE CHEST 1 VIEW COMPARISON:  11/12/2015 FINDINGS: There is hyperinflation of the lungs compatible with COPD. Heart is normal size. No confluent airspace opacities or effusions. No acute bony abnormality. Surgical clips in the right axilla. IMPRESSION: COPD.  No active disease. Electronically Signed   By: Rolm Baptise M.D.   On: 04/01/2016 15:27   Dg Hip Unilat  With Pelvis 2-3 Views Right  Result Date: 04/01/2016 CLINICAL DATA:  Fall. EXAM: DG HIP (WITH OR WITHOUT PELVIS) 2-3V RIGHT COMPARISON:  CT 07/26/2008 . FINDINGS: Degenerative changes lumbar spine and both hips. Deformity noted of the right pubis, unchanged from prior CT of 07/26/2008, consistent with old injury.No acute bony abnormality identified. No evidence of fracture. Bilateral iliac stents. Pelvic calcifications consistent with phleboliths. IMPRESSION: 1. Diffuse osteopenia and degenerative change. No acute bony abnormality identified. 2.  Bilateral iliac stents noted . Electronically Signed   By: Marcello Moores  Register   On: 04/01/2016 15:16    Procedures Procedures (including critical care time)  Medications Ordered in  ED Medications  morphine 4 MG/ML injection 4 mg (4 mg Intravenous Given 04/01/16 1559)  morphine 4 MG/ML injection 4 mg (4 mg Intravenous Given 04/01/16 1955)     Initial Impression / Assessment and Plan / ED Course  I have reviewed the triage vital signs and the nursing notes.  Pertinent labs & imaging results that were available during my care of the patient were reviewed by me and considered in my medical decision making (see chart for details).  83 year old female presents with multiple pelvic fractures after a fall 2 days ago. She is mildly hypertensive otherwise vital signs are normal. Lab work overall unremarkable. CT of hip and pelvis remarkable for right iliac fracture extending into the SI joint as well as comminuted superior and inferior pubic rami fractures. Spoke with Victorino December with with orthopedics who recommends nonweightbearing, PT, and admission. If she does well with PT it can be non-surgical. If not, he states she will need surgery. Unfortunately the only surgeon (Dr. Marcelino Scot) in [place] who would be able to surgical fix this fracture is out of town and will not be available till either Monday or Tuesday. She is understandably upset with admission but is agreeable.   Final Clinical Impressions(s) / ED Diagnoses   Final diagnoses:  Multiple closed fractures of pelvis without disruption of pelvic ring, initial encounter [facility])    New Prescriptions New Prescriptions   No medications on file     Recardo Evangelist, PA-C 04/02/16 1659

## 2016-04-01 NOTE — ED Provider Notes (Signed)
Medical screening examination/treatment/procedure(s) were conducted as a shared visit with non-physician practitioner(s) and myself.  I personally evaluated the patient during the encounter.  83 year old female who had a fall three days ago and had pelvis pain since that time but initially could bear weight but now cannot soshe came here for evaluation. On exam patient with tenderness in the posterior pelvis area but I did not attempt to have her bear weight. No other injuries elsewhere. Neurologically intact. Plan for ct's and disposition as needed.    EKG Interpretation  Date/Time:  Thursday April 01 2016 17:12:48 EST Ventricular Rate:  70 PR Interval:    QRS Duration: 87 QT Interval:  403 QTC Calculation: 435 R Axis:   13 Text Interpretation:  Unknown rhythm, irregular rate Anterior infarct, old Borderline repolarization abnormality Confirmed by [facility] MD, Rhonin Trott [phone]) on 04/01/2016 8:31:55 PM         Merrily Pew, MD 04/01/16 2310

## 2016-04-01 NOTE — H&P (Signed)
History and Physical    KARALEIGH HELLARD [id number] DOB: [birth year] DOA: 04/01/2016  Referring MD/NP/PA: Janetta Hora, PA-C PCP: Lottie Dawson, MD  Patient coming from: Home    Chief Complaint: Fall  HPI: [name] [name] is a 83 y.o. female with medical history significant of HTN, HLD, PVD s/p iliac stents, CAD, hypothyroidism, osteoarthritis,DDD, s/p laminectomy; who presents after having a fall at home 2 days ago. Patient reports she normally has to crawl up the steps and this time when she stood up on the landing lightheaded and fell backwards onto her right hip. Patient denies any significant loss of consciousness or trauma to her head. However, thereafter patient was noted to have significant pain on her right hip and forearm. She was able to bear weight, but needed the assistance of a walker. Tried possibly taking a tramadol without relief of symptoms. At the beginning of 03/2016 patient was on prednisone and antibiotics Levaquin for COPD exacerbation. Denies any significant focal weakness, chest pain, worsening shortness of breath, abdominal pain, nausea, vomiting, decreased appetite, or diarrhea. She admits to continuing to smoke approximately 1 ppd of cigarettes on average. Denies need of nicotine patch. Patient reports that she does not want to go to a nursing facility and would like to be able to go home if her pain is adequately controlled.  ED Course: Upon admission into the emergency department patient was seen to have vitals within normal limits. Lab work was significant for platelets 126, and all other labs noted to be within normal limits. Chest x-ray showed COPD, but no active disease. Imaging studies of the right hip and pelvis revealed multiple pelvic fractures. Dr. Victorino December of orthopedics was consulted, but recommended admission to [facility] with nonweightbearing until evaluation by physical therapy. If patient unable to ambulate may warrant a consultation by Dr.  Marcelino Scot. Patient was given a total of 8 mg of morphine IV while in the ED. Patient reports pain is a 0 out of 10 at this time.  Review of Systems: As per HPI otherwise 10 point review of systems negative.   Past Medical History:  Diagnosis Date  . CARCINOMA, BASAL CELL 02/13/2006  . CAROTID ARTERY DISEASE 10/03/2006   Carotid US 11/17: Stable 40-59% bilateral ICA stenosis >> f/u 1 year  . CHRONIC OBSTRUCTIVE PULMONARY DISEASE, ACUTE EXACERBATION 11/17/2009  . Chronic rhinitis 12/13/2006  . CORONARY ARTERY DISEASE 02/13/2006  . DEGENERATIVE JOINT DISEASE 09/27/2006  . DERMATITIS 09/25/2007  . Head trauma    closed  . HYPERLIPIDEMIA 03/10/2008  . HYPERTENSION 02/13/2006  . HYPOTHYROIDISM 01/30/2007  . LIVER FUNCTION TESTS, ABNORMAL 01/22/2009  . LOSS, HEARING NOS 08/25/2006  . Near syncope 09/18/2014  . NEOPLASM, SKIN, UNCERTAIN BEHAVIOR [id number]  . NEPHROLITHIASIS, HX OF 02/27/2010  . OSTEOARTHRITIS, HAND 12/16/2008  . PERIPHERAL VASCULAR DISEASE 02/13/2006  . Personal history of malignant neoplasm of breast 07/24/2008  . Personal history of urinary calculi   . Post-traumatic wound infection 12/01/2010   Mild streaking superiorly and on one day of septra  Will give rocephin and add keflex  adn follow up   . POSTHERPETIC NEURALGIA 08/25/2006  . RENAL ARTERY STENOSIS 08/01/2008  . Shingles    recurrent  . SPINAL STENOSIS 11/01/2006  . THROMBOCYTOPENIA 07/30/2008  . UNS ADVRS EFF UNS RX MEDICINAL&BIOLOGICAL SBSTNC 01/30/2007  . UNSPECIFIED ANEMIA 03/18/2008  . Unspecified vitamin D deficiency 02/07/2007  . UNSTABLE ANGINA 12/12/2009  . Vitreous detachment     Past Surgical History:  Procedure Laterality Date  .  ABDOMINAL HYSTERECTOMY    . BREAST LUMPECTOMY    . BREAST LUMPECTOMY  1998  . CATARACT EXTRACTION    . CORONARY ANGIOPLASTY WITH STENT PLACEMENT     bilat iliac stents, left renal artery and rt coronary artery last myoview 8/09 with EF 70%  . LUMBAR LAMINECTOMY    . SKIN CANCER  EXCISION    . TONSILLECTOMY       reports that she quit smoking about 5 years ago. Her smoking use included Cigarettes. She has a 30.00 pack-year smoking history. She has never used smokeless tobacco. She reports that she does not drink alcohol or use drugs.  Allergies  Allergen Reactions  . Statins Other (See Comments)    Elevated liver enzymes  . Oxycodone Anxiety    Also states, "It makes me feel like a zombie"  . Albuterol     Shakes   . Hydrocodone-Acetaminophen Nausea Only    Dizziness  . Tequin Other (See Comments)    Patient doesn't recall    Family History  Problem Relation Age of Onset  . Breast cancer Daughter   . Tuberculosis Father     Prior to Admission medications   Medication Sig Start Date End Date Taking? Authorizing Provider  amLODipine (NORVASC) 10 MG tablet Take 1 tablet (10 mg total) by mouth daily. 11/10/15  Yes Lelon Perla, MD  aspirin 81 MG tablet Take 81 mg by mouth daily.   Yes Historical Provider, MD  carboxymethylcellulose (REFRESH PLUS) 0.5 % SOLN Place 1 drop into both eyes 4 (four) times daily.   Yes Historical Provider, MD  Cholecalciferol (VITAMIN D) 1000 UNITS capsule Take 1,000 Units by mouth daily.     Yes Historical Provider, MD  fish oil-omega-3 fatty acids 1000 MG capsule Take 1 g by mouth daily.    Yes Historical Provider, MD  isosorbide mononitrate (IMDUR) 30 MG 24 hr tablet Take 1 tablet (30 mg total) by mouth daily. 10/31/15  Yes Lelon Perla, MD  levalbuterol (XOPENEX HFA) 45 MCG/ACT inhaler INHALE 2 PUFFS BY MOUTH EVERY 6 HOURS AS NEEDED FOR WHEEZING 10/15/15  Yes Rigoberto Noel, MD  levalbuterol (XOPENEX) 0.63 MG/3ML nebulizer solution Take 0.63 mg by nebulization every 4 (four) hours as needed for wheezing or shortness of breath.   Yes Historical Provider, MD  metoprolol succinate (TOPROL-XL) 25 MG 24 hr tablet Take 1 tablet (25 mg total) by mouth daily. 10/31/15  Yes Lelon Perla, MD  SPIRIVA HANDIHALER 18 MCG inhalation  capsule INHALE THE CONTENTS OF 1 CAPSULE VIA INHALATION DEVICE EVERY DAY 12/24/15  Yes Rigoberto Noel, MD  traMADol (ULTRAM) 50 MG tablet TAKE ONE TABLET BY MOUTH EVERY EIGHT HOURS AS NEEDED FOR PAIN 06/28/12  Yes Burnis Medin, MD  levofloxacin (LEVAQUIN) 500 MG tablet Take 1 tablet (500 mg total) by mouth daily. Patient not taking: Reported on 04/01/2016 03/02/16   Rigoberto Noel, MD  nitroGLYCERIN (NITROSTAT) 0.4 MG SL tablet Place 1 tablet (0.4 mg total) under the tongue every 5 (five) minutes as needed for chest pain. 11/03/15 02/01/16  Sherren Mocha, MD  predniSONE (DELTASONE) 10 MG tablet 4 tabs for 2 days, then 3 tabs for 2 days, 2 tabs for 2 days, then 1 tab for 2 days, then stop Patient not taking: Reported on 04/01/2016 03/04/16   Melvenia Needles, NP    Physical Exam:   Constitutional: Thin appearing elderly female in some mild discomfort while sitting on hospital gurney. Vitals:  04/01/16 1919 04/01/16 1945 04/01/16 2000 04/01/16 2015  BP: 158/61 (!) 143/45 (!) 150/54 (!) 149/47  Pulse:  (!) 59 65 63  Resp: 12 18 14 17   Temp:      TempSrc:      SpO2: 95% 97% 99% 95%   Eyes: PERRL, lids and conjunctivae normal ENMT: Mucous membranes are moist. Posterior pharynx clear of any exudate or lesions.Normal dentition.  Neck: normal, supple, no masses, no thyromegaly Respiratory: Normal respiratory rate, prolonged expiratory phase, no significant wheezes, or crackles appreciated.  Cardiovascular: Regular rate and rhythm, no murmurs / rubs / gallops. No extremity edema. 2+ pedal pulses. No carotid bruits.  Abdomen: no tenderness, no masses palpated. No hepatosplenomegaly. Bowel sounds positive.  Musculoskeletal: no clubbing / cyanosis. Decreased range of motion of the right hip Skin:  multiple bruises of the upper and lower extremity. Neurologic: CN 2-12 grossly intact. Sensation intact, DTR normal. Strength 5/5 in all 4.  Psychiatric: Normal judgment and insight. Alert and oriented x 3.  Normal mood.     Labs on Admission: I have personally reviewed following labs and imaging studies  CBC:  Recent Labs Lab 04/01/16 1548  WBC 7.9  NEUTROABS 5.6  HGB 12.2  HCT 38.0  MCV 92.0  PLT [id number]*   Basic Metabolic Panel:  Recent Labs Lab 04/01/16 1548  NA 140  K 3.5  CL 107  CO2 26  GLUCOSE 97  BUN 17  CREATININE 0.96  CALCIUM 9.0   GFR: CrCl cannot be calculated (Unknown ideal weight.). Liver Function Tests: No results for input(s): AST, ALT, ALKPHOS, BILITOT, PROT, ALBUMIN in the last 168 hours. No results for input(s): LIPASE, AMYLASE in the last 168 hours. No results for input(s): AMMONIA in the last 168 hours. Coagulation Profile: No results for input(s): INR, PROTIME in the last 168 hours. Cardiac Enzymes: No results for input(s): CKTOTAL, CKMB, CKMBINDEX, TROPONINI in the last 168 hours. BNP (last 3 results) No results for input(s): PROBNP in the last 8760 hours. HbA1C: No results for input(s): HGBA1C in the last 72 hours. CBG: No results for input(s): GLUCAP in the last 168 hours. Lipid Profile: No results for input(s): CHOL, HDL, LDLCALC, TRIG, CHOLHDL, LDLDIRECT in the last 72 hours. Thyroid Function Tests: No results for input(s): TSH, T4TOTAL, FREET4, T3FREE, THYROIDAB in the last 72 hours. Anemia Panel: No results for input(s): VITAMINB12, FOLATE, FERRITIN, TIBC, IRON, RETICCTPCT in the last 72 hours. Urine analysis:    Component Value Date/Time   COLORURINE YELLOW 09/21/2010 0236   APPEARANCEUR CLEAR 09/21/2010 0236   LABSPEC 1.016 09/21/2010 0236   PHURINE 5.5 09/21/2010 0236   GLUCOSEU NEGATIVE 09/21/2010 0236   HGBUR MODERATE (A) 09/21/2010 0236   HGBUR large 02/27/2010 0951   BILIRUBINUR 1+ 08/12/2014 0937   KETONESUR negative 08/15/2012 1103   KETONESUR NEGATIVE 09/21/2010 0236   PROTEINUR Trace 08/12/2014 0937   PROTEINUR NEGATIVE 09/21/2010 0236   UROBILINOGEN 0.2 08/12/2014 0937   UROBILINOGEN 0.2 09/21/2010 0236    NITRITE Negative 08/12/2014 0937   NITRITE NEGATIVE 09/21/2010 0236   LEUKOCYTESUR Negative 08/12/2014 0937   Sepsis Labs: No results found for this or any previous visit (from the past 240 hour(s)).   Radiological Exams on Admission: Dg Forearm Right  Result Date: 04/01/2016 CLINICAL DATA:  83 y/o F; status post fall 2 days ago with abrasions on the anterior right forearm just distal to midshaft. EXAM: RIGHT FOREARM - 2 VIEW COMPARISON:  None. FINDINGS: There is no evidence of fracture or  other focal bone lesions. Soft tissues are unremarkable. IMPRESSION: No acute fracture or dislocation identified. Electronically Signed   By: Kristine Garbe M.D.   On: 04/01/2016 15:13   Ct Pelvis Wo Contrast  Result Date: 04/01/2016 CLINICAL DATA:  Fall 3 days ago with known right iliac and sacral fractures, full pelvis CT for further evaluation. EXAM: CT PELVIS WITHOUT CONTRAST TECHNIQUE: Multidetector CT imaging of the pelvis was performed following the standard protocol without intravenous contrast. COMPARISON:  None. FINDINGS: Urinary Tract: Bladder is partially decompressed. No definitive filling defect is seen. Large right renal cyst is noted. It measures approximately 5.1 cm. Bowel:  No obstructive changes are seen. Vascular/Lymphatic: Diffuse aortic calcifications are noted. There are changes consistent with iliac stents bilaterally. Reproductive: The uterus has been surgically removed. No pelvic mass lesion is noted. Other: No significant hematoma is identified. No free pelvic fluid is noted. Musculoskeletal: The previously seen superior and inferior pubic ramus fractures are again identified and stable. The known right iliac bone fracture extending into the sacroiliac joint is again seen with medial displacement of the more anterior portion of the iliac bone. The fracture extends superiorly in the iliac bone all the way to the lateral aspect of the iliac wing. The more superior component is less  displaced than the inferior component. Minimal sacral fracture is noted on the right similar to that seen on the prior exam. No definitive fracture is noted in the more superior aspect of the sacrum. Postsurgical changes are noted in the lower lumbar spine. No other definitive fractures are seen. IMPRESSION: The known right iliac bone extends superiorly into the lateral aspect of the iliac wing but is not significantly displaced superiorly. No new focal fractures are seen. The known the small sacral fracture on the right is noted and unchanged. The pubic ramus fractures are unchanged as well. Electronically Signed   By: Inez Catalina M.D.   On: 04/01/2016 19:39   Ct Hip Right Wo Contrast  Result Date: 04/01/2016 CLINICAL DATA:  Fall at home 3 days ago. Right hip pain and difficulty bearing weight. EXAM: CT OF THE RIGHT HIP WITHOUT CONTRAST TECHNIQUE: Multidetector CT imaging of the right hip was performed according to the standard protocol. Multiplanar CT image reconstructions were also generated. COMPARISON:  04/01/2016 radiographs FINDINGS: Bones/Joint/Cartilage Vertical fracture in the right iliac bone extending into the sacroiliac joint, with a fracture of the anterior margin of the sacrum shown on the top most image number 1/5. Neither the sacral nor the iliac bone fracture are completely included on this CT of the hip which does not include the entire bony pelvis. The upper portion of the right iliac bone is displaced 8 mm medially with respect to the lower portion of the right iliac bone. Mildly comminuted fracture of the inferior pubic ramus extending into the pubic body and adjacent superior pubic ramus, images 229-280 8/5. Ligaments Suboptimally assessed by CT. Muscles and Tendons Unremarkable Soft tissues Presacral edema and aches edema extending in the sciatic notch. No direct bony impingement on the sciatic nerve is seen. IMPRESSION: 1. Mildly displaced fracture through the right iliac bone extending  into the lower part of the right SI joint, and involving part of the adjacent sacrum ; with comminuted fractures of the right superior and inferior pubic ramus extending into the pubic body. The iliac bone fracture is only partially included on today's exam which was focused on the hip rather than the entire bony pelvis. Based on the overall appearance I  expect that this is a [place] type 2 lateral compression injury. It would probably be prudent to re- scan the entire bony pelvis to fully characterize the pelvic fractures. Electronically Signed   By: Van Clines M.D.   On: 04/01/2016 17:07   Dg Chest Port 1 View  Result Date: 04/01/2016 CLINICAL DATA:  Preop. EXAM: PORTABLE CHEST 1 VIEW COMPARISON:  11/12/2015 FINDINGS: There is hyperinflation of the lungs compatible with COPD. Heart is normal size. No confluent airspace opacities or effusions. No acute bony abnormality. Surgical clips in the right axilla. IMPRESSION: COPD.  No active disease. Electronically Signed   By: Rolm Baptise M.D.   On: 04/01/2016 15:27   Dg Hip Unilat  With Pelvis 2-3 Views Right  Result Date: 04/01/2016 CLINICAL DATA:  Fall. EXAM: DG HIP (WITH OR WITHOUT PELVIS) 2-3V RIGHT COMPARISON:  CT 07/26/2008 . FINDINGS: Degenerative changes lumbar spine and both hips. Deformity noted of the right pubis, unchanged from prior CT of 07/26/2008, consistent with old injury.No acute bony abnormality identified. No evidence of fracture. Bilateral iliac stents. Pelvic calcifications consistent with phleboliths. IMPRESSION: 1. Diffuse osteopenia and degenerative change. No acute bony abnormality identified. 2.  Bilateral iliac stents noted . Electronically Signed   By: Marcello Moores  Register   On: 04/01/2016 15:16    EKG: Independently reviewed. Atrial fibrillation at 70 bpm  Assessment/Plan Multiple pelvic fractures 2/2 Fall: Acute. Patient reports falling 2 days ago and had been ambulating, but noticed significant pain and  therefore came in for further evaluation. Multiple pelvic fractures seen on imaging studies. Orthopedics consulted, but recommended physical therapy to eval and if surgery needed with likely need to be performed by Dr. Marcelino Scot. - Admit to a MedSurg bed  - Hydrocodone/ Morphine prn moderate to severe pain respectively - Bowel regimen of Senna scheduled  - Nonweightbearing; except with Physical therapy  - Physical Therapy to eval and treat - Appreciate orthopedic consultative services. Depending on patient's ability to walk with physical therapy will determine if patient needs to be evaluated by Dr. Marcelino Scot for possible surgical intervention. However, Dr. Marcelino Scot out of town until 04/05/16. - Social work and care management consult  COPD, without acute exacerbation: Stable. - Continue home regimen of Spiriva, Xopenex neb prn  Essential hypertension - Continue amlodipine , Metoprolol, isosorbide mononitrate  Thrombocytopenia: Chronic. Platelets 126 on admission. - Continue to monitor  Tobacco abuse: Patient reports smoking at least one pack of cigarettes per day on average. Patient declines nicotine patch. - Counseled on the need for cessation of tobacco   DVT prophylaxis: Lovenox Code Status: Full Family Communication: Discussed plan of care with the patient and neighbors who are present at bedside. Disposition Plan: TBD  Consults called: Orthopedics Admission status: Observation, but may need to be transitioned to inpatient if requiring surgery  Norval Morton MD [facility] Pager [phone]  If 7PM-7AM, please contact night-coverage [email or web address] Password [facility]  04/01/2016, 8:44 PM

## 2016-04-01 NOTE — ED Triage Notes (Signed)
Pt reports she fell walking up steps the other day. She reports she lost her balance. Now reports right hip pain and right arm pain. Pt unable to bear weight on the hip.

## 2016-04-01 NOTE — ED Notes (Signed)
Pt given [place] sandwich meal, graham crackers, and coca-cola.

## 2016-04-02 ENCOUNTER — Observation Stay (HOSPITAL_COMMUNITY): Payer: MEDICARE

## 2016-04-02 DIAGNOSIS — I10 Essential (primary) hypertension: Secondary | ICD-10-CM

## 2016-04-02 DIAGNOSIS — S32810A Multiple fractures of pelvis with stable disruption of pelvic ring, initial encounter for closed fracture: Principal | ICD-10-CM

## 2016-04-02 DIAGNOSIS — S3282XA Multiple fractures of pelvis without disruption of pelvic ring, initial encounter for closed fracture: Secondary | ICD-10-CM | POA: Diagnosis not present

## 2016-04-02 DIAGNOSIS — S32511A Fracture of superior rim of right pubis, initial encounter for closed fracture: Secondary | ICD-10-CM | POA: Diagnosis not present

## 2016-04-02 DIAGNOSIS — W19XXXA Unspecified fall, initial encounter: Secondary | ICD-10-CM | POA: Diagnosis not present

## 2016-04-02 DIAGNOSIS — D696 Thrombocytopenia, unspecified: Secondary | ICD-10-CM

## 2016-04-02 LAB — BASIC METABOLIC PANEL
ANION GAP: 11 (ref 5–15)
BUN: 17 mg/dL (ref 6–20)
CHLORIDE: 104 mmol/L (ref 101–111)
CO2: 23 mmol/L (ref 22–32)
Calcium: 8.9 mg/dL (ref 8.9–10.3)
Creatinine, Ser: 1.05 mg/dL — ABNORMAL HIGH (ref 0.44–1.00)
GFR calc Af Amer: 54 mL/min — ABNORMAL LOW (ref >60)
GFR, EST NON AFRICAN AMERICAN: 47 mL/min — AB (ref >60)
Glucose, Bld: 150 mg/dL — ABNORMAL HIGH (ref 65–99)
POTASSIUM: 3 mmol/L — AB (ref 3.5–5.1)
Sodium: 138 mmol/L (ref 135–145)

## 2016-04-02 LAB — CBC
HEMATOCRIT: 36.3 % (ref 36.0–46.0)
HEMOGLOBIN: 11.4 g/dL — AB (ref 12.0–15.0)
MCH: 29.1 pg (ref 26.0–34.0)
MCHC: 31.4 g/dL (ref 30.0–36.0)
MCV: 92.6 fL (ref 78.0–100.0)
PLATELETS: 124 10*3/uL — AB (ref 150–400)
RBC: 3.92 MIL/uL (ref 3.87–5.11)
RDW: 14.7 % (ref 11.5–15.5)
WBC: 6.8 10*3/uL (ref 4.0–10.5)

## 2016-04-02 LAB — T4, FREE: Free T4: 1.11 ng/dL (ref 0.61–1.12)

## 2016-04-02 LAB — TSH: TSH: 8.334 u[IU]/mL — ABNORMAL HIGH (ref 0.350–4.500)

## 2016-04-02 MED ORDER — ONDANSETRON HCL 4 MG/2ML IJ SOLN
4.0000 mg | Freq: Four times a day (QID) | INTRAMUSCULAR | Status: DC | PRN
  Administered 2016-04-02: 4 mg via INTRAVENOUS

## 2016-04-02 MED ORDER — HYDROCODONE-ACETAMINOPHEN 5-325 MG PO TABS: 1.0000 | ORAL_TABLET | ORAL | Status: DC | PRN

## 2016-04-02 MED ORDER — ONDANSETRON HCL 4 MG/2ML IJ SOLN
INTRAMUSCULAR | Status: AC
  Filled 2016-04-02: qty 2

## 2016-04-02 MED ORDER — TRAMADOL HCL 50 MG PO TABS
100.0000 mg | ORAL_TABLET | Freq: Four times a day (QID) | ORAL | Status: DC | PRN
  Administered 2016-04-02 – 2016-04-05 (×4): 100 mg via ORAL
  Filled 2016-04-02 (×4): qty 2

## 2016-04-02 MED ORDER — PROMETHAZINE HCL 25 MG/ML IJ SOLN
6.2500 mg | Freq: Once | INTRAMUSCULAR | Status: AC
  Administered 2016-04-02: 6.25 mg via INTRAVENOUS
  Filled 2016-04-02: qty 1

## 2016-04-02 NOTE — Care Management Obs Status (Signed)
[place] NOTIFICATION   Patient Details  Name: [name] [name] MRN: [id number] Date of Birth: 07-21-1927   Medicare Observation Status Notification Given:  Yes    Marilu Favre, RN 04/02/2016, 9:31 AM

## 2016-04-02 NOTE — Care Management Note (Addendum)
Case Management Note  Patient Details  Name: CHAUNTAY BUER MRN: [id number] Date of Birth: 03-31-1927  Subjective/Objective:                    Action/Plan:   Expected Discharge Date:                  Expected Discharge Plan:  [place]  In-House Referral:     Discharge planning Services  CM Consult  Post Acute Care Choice:  Home Health Choice offered to:  Patient  DME Arranged:  3-N-1 DME Agency:  [place]:  Nurse's Aide, OT, PT, RN [facility] Agency:  [place]  Status of Service:  Completed, signed off  If discussed at [place] of Stay Meetings, dates discussed:    Additional Comments:  Marilu Favre, RN 04/02/2016, 12:24 PM

## 2016-04-02 NOTE — Clinical Social Work Note (Signed)
CSW consulted for SNF placement. P/T recommending home with home health. CSW consulted with RNCM, who stated pt refusing SNF. RNCM will continue to follow pt for d/c planning. CSW sigining off as no further needs identified. Reconsult if new needs arise.   Oretha Ellis, [place], [place] Work [phone]

## 2016-04-02 NOTE — Care Management Note (Signed)
Case Management Note  Patient Details  Name: MEIGAN TOLLES MRN: [id number] Date of Birth: 1927-03-22  Subjective/Objective:                    Action/Plan:  Discussed discharge planning with patient. Patient wants to discharge to home with home health through [place] . Patient does live alone ,however, she has neighbors who can stay with her and bring her food. Will visit with patient after PT eval.  Expected Discharge Date:                  Expected Discharge Plan:  [place]  In-House Referral:     Discharge planning Services  CM Consult  Post Acute Care Choice:  Home Health Choice offered to:  Patient  DME Arranged:    DME Agency:     HH Arranged:    [place] Agency:     Status of Service:  In process, will continue to follow  If discussed at Long Length of Stay Meetings, dates discussed:    Additional Comments:  Marilu Favre, RN 04/02/2016, 9:32 AM

## 2016-04-02 NOTE — Consult Note (Signed)
ORTHOPAEDIC CONSULTATION  REQUESTING PHYSICIAN: Dron Tanna Furry, MD  PCP:  Lottie Dawson, MD  Chief Complaint: Right hip pain  HPI: [name] [name] is a 83 y.o. female who complains of right hip pain and difficulty walking.  She has a very complex medical history.  She states after a fall three days ago she had increasing pain and difficulty walking, and sustained a fall onto the right hip.  She had a feeling of dizziness but denies LOC or head trauma.  In the ED I discussed her care with the ED providers and we recommended TDWB to the RLE and admission for PT and potential admission to rehab if needed.  Currently she is adamant about not wanting to go to rehab or have surgery, but she seems to be understanding now that this may be a possibility.  Past Medical History:  Diagnosis Date  . CARCINOMA, BASAL CELL 02/13/2006  . CAROTID ARTERY DISEASE 10/03/2006   Carotid US 11/17: Stable 40-59% bilateral ICA stenosis >> f/u 1 year  . CHRONIC OBSTRUCTIVE PULMONARY DISEASE, ACUTE EXACERBATION 11/17/2009  . Chronic rhinitis 12/13/2006  . CORONARY ARTERY DISEASE 02/13/2006  . DEGENERATIVE JOINT DISEASE 09/27/2006  . DERMATITIS 09/25/2007  . Head trauma    closed  . HYPERLIPIDEMIA 03/10/2008  . HYPERTENSION 02/13/2006  . HYPOTHYROIDISM 01/30/2007  . LIVER FUNCTION TESTS, ABNORMAL 01/22/2009  . LOSS, HEARING NOS 08/25/2006  . Near syncope 09/18/2014  . NEOPLASM, SKIN, UNCERTAIN BEHAVIOR [id number]  . NEPHROLITHIASIS, HX OF 02/27/2010  . OSTEOARTHRITIS, HAND 12/16/2008  . PERIPHERAL VASCULAR DISEASE 02/13/2006  . Personal history of malignant neoplasm of breast 07/24/2008  . Personal history of urinary calculi   . Post-traumatic wound infection 12/01/2010   Mild streaking superiorly and on one day of septra  Will give rocephin and add keflex  adn follow up   . POSTHERPETIC NEURALGIA 08/25/2006  . RENAL ARTERY STENOSIS 08/01/2008  . Shingles    recurrent  . SPINAL STENOSIS  11/01/2006  . THROMBOCYTOPENIA 07/30/2008  . UNS ADVRS EFF UNS RX MEDICINAL&BIOLOGICAL SBSTNC 01/30/2007  . UNSPECIFIED ANEMIA 03/18/2008  . Unspecified vitamin D deficiency 02/07/2007  . UNSTABLE ANGINA 12/12/2009  . Vitreous detachment    Past Surgical History:  Procedure Laterality Date  . ABDOMINAL HYSTERECTOMY    . BREAST LUMPECTOMY    . BREAST LUMPECTOMY  1998  . CATARACT EXTRACTION    . CORONARY ANGIOPLASTY WITH STENT PLACEMENT     bilat iliac stents, left renal artery and rt coronary artery last myoview 8/09 with EF 70%  . LUMBAR LAMINECTOMY    . SKIN CANCER EXCISION    . TONSILLECTOMY     Social History   Social History  . Marital status: Widowed    Spouse name: N/A  . Number of children: N/A  . Years of education: 42   Occupational History  . retired    Social History Main Topics  . Smoking status: Former Smoker    Packs/day: 1.00    Years: 30.00    Types: Cigarettes    Quit date: 06/15/2010  . Smokeless tobacco: Never Used     Comment: 2-3 cigs per week/// 06/30/12  . Alcohol use No     Comment: socially  . Drug use: No  . Sexual activity: Not Asked   Other Topics Concern  . None   Social History Narrative   Widowed   5 hours of sleep   Has friends checking on her   No Pets  Patient drinks about 6 cups of caffeine daily.   Patient is right handed.   Family History  Problem Relation Age of Onset  . Breast cancer Daughter   . Tuberculosis Father    Allergies  Allergen Reactions  . Statins Other (See Comments)    Elevated liver enzymes  . Oxycodone Anxiety    Also states, "It makes me feel like a zombie"  . Albuterol     Shakes   . Hydrocodone-Acetaminophen Nausea Only    Dizziness  . Tequin Other (See Comments)    Patient doesn't recall   Prior to Admission medications   Medication Sig Start Date End Date Taking? Authorizing Provider  amLODipine (NORVASC) 10 MG tablet Take 1 tablet (10 mg total) by mouth daily. 11/10/15  Yes Lelon Perla, MD  aspirin 81 MG tablet Take 81 mg by mouth daily.   Yes Historical Provider, MD  carboxymethylcellulose (REFRESH PLUS) 0.5 % SOLN Place 1 drop into both eyes 4 (four) times daily.   Yes Historical Provider, MD  Cholecalciferol (VITAMIN D) 1000 UNITS capsule Take 1,000 Units by mouth daily.     Yes Historical Provider, MD  fish oil-omega-3 fatty acids 1000 MG capsule Take 1 g by mouth daily.    Yes Historical Provider, MD  isosorbide mononitrate (IMDUR) 30 MG 24 hr tablet Take 1 tablet (30 mg total) by mouth daily. 10/31/15  Yes Lelon Perla, MD  levalbuterol (XOPENEX HFA) 45 MCG/ACT inhaler INHALE 2 PUFFS BY MOUTH EVERY 6 HOURS AS NEEDED FOR WHEEZING 10/15/15  Yes Rigoberto Noel, MD  levalbuterol (XOPENEX) 0.63 MG/3ML nebulizer solution Take 0.63 mg by nebulization every 4 (four) hours as needed for wheezing or shortness of breath.   Yes Historical Provider, MD  metoprolol succinate (TOPROL-XL) 25 MG 24 hr tablet Take 1 tablet (25 mg total) by mouth daily. 10/31/15  Yes Lelon Perla, MD  SPIRIVA HANDIHALER 18 MCG inhalation capsule INHALE THE CONTENTS OF 1 CAPSULE VIA INHALATION DEVICE EVERY DAY 12/24/15  Yes Rigoberto Noel, MD  traMADol (ULTRAM) 50 MG tablet TAKE ONE TABLET BY MOUTH EVERY EIGHT HOURS AS NEEDED FOR PAIN 06/28/12  Yes Burnis Medin, MD  levofloxacin (LEVAQUIN) 500 MG tablet Take 1 tablet (500 mg total) by mouth daily. Patient not taking: Reported on 04/01/2016 03/02/16   Rigoberto Noel, MD  nitroGLYCERIN (NITROSTAT) 0.4 MG SL tablet Place 1 tablet (0.4 mg total) under the tongue every 5 (five) minutes as needed for chest pain. 11/03/15 02/01/16  Sherren Mocha, MD  predniSONE (DELTASONE) 10 MG tablet 4 tabs for 2 days, then 3 tabs for 2 days, 2 tabs for 2 days, then 1 tab for 2 days, then stop Patient not taking: Reported on 04/01/2016 03/04/16   Melvenia Needles, NP   Dg Forearm Right  Result Date: 04/01/2016 CLINICAL DATA:  83 y/o F; status post fall 2 days ago with abrasions  on the anterior right forearm just distal to midshaft. EXAM: RIGHT FOREARM - 2 VIEW COMPARISON:  None. FINDINGS: There is no evidence of fracture or other focal bone lesions. Soft tissues are unremarkable. IMPRESSION: No acute fracture or dislocation identified. Electronically Signed   By: Kristine Garbe M.D.   On: 04/01/2016 15:13   Ct Pelvis Wo Contrast  Result Date: 04/01/2016 CLINICAL DATA:  Fall 3 days ago with known right iliac and sacral fractures, full pelvis CT for further evaluation. EXAM: CT PELVIS WITHOUT CONTRAST TECHNIQUE: Multidetector CT imaging of the pelvis  was performed following the standard protocol without intravenous contrast. COMPARISON:  None. FINDINGS: Urinary Tract: Bladder is partially decompressed. No definitive filling defect is seen. Large right renal cyst is noted. It measures approximately 5.1 cm. Bowel:  No obstructive changes are seen. Vascular/Lymphatic: Diffuse aortic calcifications are noted. There are changes consistent with iliac stents bilaterally. Reproductive: The uterus has been surgically removed. No pelvic mass lesion is noted. Other: No significant hematoma is identified. No free pelvic fluid is noted. Musculoskeletal: The previously seen superior and inferior pubic ramus fractures are again identified and stable. The known right iliac bone fracture extending into the sacroiliac joint is again seen with medial displacement of the more anterior portion of the iliac bone. The fracture extends superiorly in the iliac bone all the way to the lateral aspect of the iliac wing. The more superior component is less displaced than the inferior component. Minimal sacral fracture is noted on the right similar to that seen on the prior exam. No definitive fracture is noted in the more superior aspect of the sacrum. Postsurgical changes are noted in the lower lumbar spine. No other definitive fractures are seen. IMPRESSION: The known right iliac bone extends superiorly  into the lateral aspect of the iliac wing but is not significantly displaced superiorly. No new focal fractures are seen. The known the small sacral fracture on the right is noted and unchanged. The pubic ramus fractures are unchanged as well. Electronically Signed   By: Inez Catalina M.D.   On: 04/01/2016 19:39   Dg Pelvis Comp Min 3v  Result Date: 04/02/2016 CLINICAL DATA:  Right pelvic fractures. EXAM: JUDET PELVIS - 3+ VIEW COMPARISON:  04/01/2016 pelvic CT. FINDINGS: Inlet, outlet and frontal views of the pelvis were obtained. Re- demonstrated are fractures of the medial superior and inferior right pubic rami involving the right pubic body, with minimal 4 mm inferior displacement of the medial fracture fragment on the inlet view. There is limited visualization of a comminuted fracture involving the medial right iliac bone extending to the lower right sacroiliac joint, with extension of the fracture into the right iliac wing, without appreciable displacement on these views. Minimal diastasis of the lower right sacroiliac joint . No hip dislocation. Vascular stents overlie the atherosclerotic italic iliac vessels. IMPRESSION: 1. Minimally displaced fractures of the medial superior and inferior right pubic rami involving the right pubic body. 2. Limited visualization of comminuted medial right iliac bone fracture extending to the lower right sacroiliac joint and right iliac wing, without appreciable displacement. 3. Minimal diastasis of the lower right sacroiliac joint. Electronically Signed   By: Ilona Sorrel M.D.   On: 04/02/2016 16:35   Ct Hip Right Wo Contrast  Result Date: 04/01/2016 CLINICAL DATA:  Fall at home 3 days ago. Right hip pain and difficulty bearing weight. EXAM: CT OF THE RIGHT HIP WITHOUT CONTRAST TECHNIQUE: Multidetector CT imaging of the right hip was performed according to the standard protocol. Multiplanar CT image reconstructions were also generated. COMPARISON:  04/01/2016 radiographs  FINDINGS: Bones/Joint/Cartilage Vertical fracture in the right iliac bone extending into the sacroiliac joint, with a fracture of the anterior margin of the sacrum shown on the top most image number 1/5. Neither the sacral nor the iliac bone fracture are completely included on this CT of the hip which does not include the entire bony pelvis. The upper portion of the right iliac bone is displaced 8 mm medially with respect to the lower portion of the right iliac bone. Mildly  comminuted fracture of the inferior pubic ramus extending into the pubic body and adjacent superior pubic ramus, images 229-280 8/5. Ligaments Suboptimally assessed by CT. Muscles and Tendons Unremarkable Soft tissues Presacral edema and aches edema extending in the sciatic notch. No direct bony impingement on the sciatic nerve is seen. IMPRESSION: 1. Mildly displaced fracture through the right iliac bone extending into the lower part of the right SI joint, and involving part of the adjacent sacrum ; with comminuted fractures of the right superior and inferior pubic ramus extending into the pubic body. The iliac bone fracture is only partially included on today's exam which was focused on the hip rather than the entire bony pelvis. Based on the overall appearance I expect that this is a Young and Carrizozo type 2 lateral compression injury. It would probably be prudent to re- scan the entire bony pelvis to fully characterize the pelvic fractures. Electronically Signed   By: Van Clines M.D.   On: 04/01/2016 17:07   Dg Chest Port 1 View  Result Date: 04/01/2016 CLINICAL DATA:  Preop. EXAM: PORTABLE CHEST 1 VIEW COMPARISON:  11/12/2015 FINDINGS: There is hyperinflation of the lungs compatible with COPD. Heart is normal size. No confluent airspace opacities or effusions. No acute bony abnormality. Surgical clips in the right axilla. IMPRESSION: COPD.  No active disease. Electronically Signed   By: Rolm Baptise M.D.   On: 04/01/2016 15:27    Dg Hip Unilat  With Pelvis 2-3 Views Right  Result Date: 04/01/2016 CLINICAL DATA:  Fall. EXAM: DG HIP (WITH OR WITHOUT PELVIS) 2-3V RIGHT COMPARISON:  CT 07/26/2008 . FINDINGS: Degenerative changes lumbar spine and both hips. Deformity noted of the right pubis, unchanged from prior CT of 07/26/2008, consistent with old injury.No acute bony abnormality identified. No evidence of fracture. Bilateral iliac stents. Pelvic calcifications consistent with phleboliths. IMPRESSION: 1. Diffuse osteopenia and degenerative change. No acute bony abnormality identified. 2.  Bilateral iliac stents noted . Electronically Signed   By: Marcello Moores  Register   On: 04/01/2016 15:16    Positive ROS: All other systems have been reviewed and were otherwise negative with the exception of those mentioned in the HPI and as above.  Physical Exam: General: Alert, no acute distress Cardiovascular: No pedal edema Respiratory: No cyanosis, no use of accessory musculature GI: No organomegaly, abdomen is soft and non-tender Skin: No lesions in the area of chief complaint Neurologic: Sensation intact distally Psychiatric: Patient is competent for consent with normal mood and affect Lymphatic: No axillary or cervical lymphadenopathy  MUSCULOSKELETAL:  RLE/hip- TTP along right SI joint, minimally tender in midline.  + pain with Logroll right leg, unable to perform Stinchfield 2/2 pain.  +NVI distally  Assessment: Right LC II pelvic ring fracture  Plan: -will plan for non op treatment now with TDWB to the RLE -I have ordered pelvis xrays for comparison views on Monday -We will see how she does from mobility and pain standpoint over the weekend and reassess Monday for any need for change of plans, with our Trauma Ortho surgeon Dr. Marcelino Scot -I have explained at length with the patient that returning home in her current state would be AMA and dangerous and she expressed understanding and seems willing to participate in our  plan     Nicholes Stairs, [place] [phone]    04/02/2016 7:12 PM

## 2016-04-02 NOTE — Progress Notes (Addendum)
PROGRESS NOTE    [name] [name]  [id number] DOB: 01/16/1928 DOA: 04/01/2016 PCP: Lottie Dawson, MD   Brief Narrative: 83 y.o. female with medical history significant of HTN, HLD, PVD s/p iliac stents, CAD, hypothyroidism, osteoarthritis,DDD, s/p laminectomy; who presents after having a fall at home 2 days ago. Imaging studies of the right hip and pelvis revealed multiple pelvic fractures. Dr. Victorino December of orthopedics was consulted, but recommended admission to [facility] with nonweightbearing until evaluation by physical therapy. Assessment & Plan:    # Multiple pelvic fractures ([place]) secondary due to fall. I discussed with orthopedics Dr. Stann Mainland for the consult. Patient will be evaluated today. Also discussed with the physical therapist. -Patient reported that her pain is currently controlled. She clearly does not want any surgical procedure and wanted to go home with outpatient physical therapy. She said she has very good supporting neighbors. -Continue pain management with hydrocortisone and IV morphine for breakthrough pain. -We'll consult case management team for safe discharge planning. -TSH elevated, will check free t3 and free T4. Needs PCP follow ups.   # Essential hypertension, benign: Blood pressure acceptable. Continue amlodipine and metoprolol.  # COPD (chronic obstructive pulmonary disease) ([place]): Stable continue Spiriva and breathing treatment as needed    #Thrombocytopenia ([place]), chronic: No sign of bleeding. Continue to monitor    # TOBACCO USE: Education provided to the patient.   DVT prophylaxis: Lovenox subcutaneous Code Status: Full code Family Communication: No family present at bedside Disposition Plan: Likely discharge home with home care services. Patient declined to go to rehabilitation center or SNF    Consultants:   Orthopedics  Procedures: None Antimicrobials: None  Subjective: Patient was seen and examined at bedside. She reported  feeling better. Pain is stable and improved with the current pain medication. Denied nausea vomiting chest pain or shortness of breath. She wanted to go home with home care services  Objective: Vitals:   04/01/16 2130 04/01/16 2309 04/02/16 0000 04/02/16 0149  BP: 119/66 114/61 (!) 135/52 (!) 147/58  Pulse: 71 (!) 53 (!) 56 (!) 58  Resp: (!) 28 20 18 17   Temp:    98 F (36.7 C)  TempSrc:    Oral  SpO2: 95% 96% 94% 97%  Weight:    47.6 kg (104 lb 14.4 oz)    Intake/Output Summary (Last 24 hours) at 04/02/16 1046 Last data filed at 04/01/16 2034  Gross per 24 hour  Intake                0 ml  Output              550 ml  Net             -550 ml   Filed Weights   04/02/16 0149  Weight: 47.6 kg (104 lb 14.4 oz)    Examination:  General exam: Appears calm and comfortable  Respiratory system: Clear to auscultation. Respiratory effort normal. No wheezing or crackle Cardiovascular system: S1 & S2 heard, RRR.  No pedal edema. Gastrointestinal system: Abdomen is nondistended, soft and nontender. Normal bowel sounds heard. Central nervous system: Alert and oriented. No focal neurological deficits. Extremities: Able to move lower extremities  Skin: No rashes, lesions or ulcers Psychiatry: Judgement and insight appear normal. Mood & affect appropriate.     Data Reviewed: I have personally reviewed following labs and imaging studies  CBC:  Recent Labs Lab 04/01/16 1548 04/02/16 0103  WBC 7.9 6.8  NEUTROABS 5.6  --  HGB 12.2 11.4*  HCT 38.0 36.3  MCV 92.0 92.6  PLT 126* [id number]*   Basic Metabolic Panel:  Recent Labs Lab 04/01/16 1548 04/02/16 0103  NA 140 138  K 3.5 3.0*  CL 107 104  CO2 26 23  GLUCOSE 97 150*  BUN 17 17  CREATININE 0.96 1.05*  CALCIUM 9.0 8.9   GFR: Estimated Creatinine Clearance: 28.9 mL/min (by C-G formula based on SCr of 1.05 mg/dL (H)). Liver Function Tests: No results for input(s): AST, ALT, ALKPHOS, BILITOT, PROT, ALBUMIN in the last 168  hours. No results for input(s): LIPASE, AMYLASE in the last 168 hours. No results for input(s): AMMONIA in the last 168 hours. Coagulation Profile: No results for input(s): INR, PROTIME in the last 168 hours. Cardiac Enzymes: No results for input(s): CKTOTAL, CKMB, CKMBINDEX, TROPONINI in the last 168 hours. BNP (last 3 results) No results for input(s): PROBNP in the last 8760 hours. HbA1C: No results for input(s): HGBA1C in the last 72 hours. CBG: No results for input(s): GLUCAP in the last 168 hours. Lipid Profile: No results for input(s): CHOL, HDL, LDLCALC, TRIG, CHOLHDL, LDLDIRECT in the last 72 hours. Thyroid Function Tests:  Recent Labs  04/02/16 0103 04/02/16 0844  TSH 8.334*  --   FREET4  --  1.11   Anemia Panel: No results for input(s): VITAMINB12, FOLATE, FERRITIN, TIBC, IRON, RETICCTPCT in the last 72 hours. Sepsis Labs: No results for input(s): PROCALCITON, LATICACIDVEN in the last 168 hours.  No results found for this or any previous visit (from the past 240 hour(s)).       Radiology Studies: Dg Forearm Right  Result Date: 04/01/2016 CLINICAL DATA:  83 y/o F; status post fall 2 days ago with abrasions on the anterior right forearm just distal to midshaft. EXAM: RIGHT FOREARM - 2 VIEW COMPARISON:  None. FINDINGS: There is no evidence of fracture or other focal bone lesions. Soft tissues are unremarkable. IMPRESSION: No acute fracture or dislocation identified. Electronically Signed   By: Kristine Garbe M.D.   On: 04/01/2016 15:13   Ct Pelvis Wo Contrast  Result Date: 04/01/2016 CLINICAL DATA:  Fall 3 days ago with known right iliac and sacral fractures, full pelvis CT for further evaluation. EXAM: CT PELVIS WITHOUT CONTRAST TECHNIQUE: Multidetector CT imaging of the pelvis was performed following the standard protocol without intravenous contrast. COMPARISON:  None. FINDINGS: Urinary Tract: Bladder is partially decompressed. No definitive filling defect  is seen. Large right renal cyst is noted. It measures approximately 5.1 cm. Bowel:  No obstructive changes are seen. Vascular/Lymphatic: Diffuse aortic calcifications are noted. There are changes consistent with iliac stents bilaterally. Reproductive: The uterus has been surgically removed. No pelvic mass lesion is noted. Other: No significant hematoma is identified. No free pelvic fluid is noted. Musculoskeletal: The previously seen superior and inferior pubic ramus fractures are again identified and stable. The known right iliac bone fracture extending into the sacroiliac joint is again seen with medial displacement of the more anterior portion of the iliac bone. The fracture extends superiorly in the iliac bone all the way to the lateral aspect of the iliac wing. The more superior component is less displaced than the inferior component. Minimal sacral fracture is noted on the right similar to that seen on the prior exam. No definitive fracture is noted in the more superior aspect of the sacrum. Postsurgical changes are noted in the lower lumbar spine. No other definitive fractures are seen. IMPRESSION: The known right iliac bone extends  superiorly into the lateral aspect of the iliac wing but is not significantly displaced superiorly. No new focal fractures are seen. The known the small sacral fracture on the right is noted and unchanged. The pubic ramus fractures are unchanged as well. Electronically Signed   By: Inez Catalina M.D.   On: 04/01/2016 19:39   Ct Hip Right Wo Contrast  Result Date: 04/01/2016 CLINICAL DATA:  Fall at home 3 days ago. Right hip pain and difficulty bearing weight. EXAM: CT OF THE RIGHT HIP WITHOUT CONTRAST TECHNIQUE: Multidetector CT imaging of the right hip was performed according to the standard protocol. Multiplanar CT image reconstructions were also generated. COMPARISON:  04/01/2016 radiographs FINDINGS: Bones/Joint/Cartilage Vertical fracture in the right iliac bone extending  into the sacroiliac joint, with a fracture of the anterior margin of the sacrum shown on the top most image number 1/5. Neither the sacral nor the iliac bone fracture are completely included on this CT of the hip which does not include the entire bony pelvis. The upper portion of the right iliac bone is displaced 8 mm medially with respect to the lower portion of the right iliac bone. Mildly comminuted fracture of the inferior pubic ramus extending into the pubic body and adjacent superior pubic ramus, images 229-280 8/5. Ligaments Suboptimally assessed by CT. Muscles and Tendons Unremarkable Soft tissues Presacral edema and aches edema extending in the sciatic notch. No direct bony impingement on the sciatic nerve is seen. IMPRESSION: 1. Mildly displaced fracture through the right iliac bone extending into the lower part of the right SI joint, and involving part of the adjacent sacrum ; with comminuted fractures of the right superior and inferior pubic ramus extending into the pubic body. The iliac bone fracture is only partially included on today's exam which was focused on the hip rather than the entire bony pelvis. Based on the overall appearance I expect that this is a Young and Collins type 2 lateral compression injury. It would probably be prudent to re- scan the entire bony pelvis to fully characterize the pelvic fractures. Electronically Signed   By: Van Clines M.D.   On: 04/01/2016 17:07   Dg Chest Port 1 View  Result Date: 04/01/2016 CLINICAL DATA:  Preop. EXAM: PORTABLE CHEST 1 VIEW COMPARISON:  11/12/2015 FINDINGS: There is hyperinflation of the lungs compatible with COPD. Heart is normal size. No confluent airspace opacities or effusions. No acute bony abnormality. Surgical clips in the right axilla. IMPRESSION: COPD.  No active disease. Electronically Signed   By: Rolm Baptise M.D.   On: 04/01/2016 15:27   Dg Hip Unilat  With Pelvis 2-3 Views Right  Result Date: 04/01/2016 CLINICAL DATA:   Fall. EXAM: DG HIP (WITH OR WITHOUT PELVIS) 2-3V RIGHT COMPARISON:  CT 07/26/2008 . FINDINGS: Degenerative changes lumbar spine and both hips. Deformity noted of the right pubis, unchanged from prior CT of 07/26/2008, consistent with old injury.No acute bony abnormality identified. No evidence of fracture. Bilateral iliac stents. Pelvic calcifications consistent with phleboliths. IMPRESSION: 1. Diffuse osteopenia and degenerative change. No acute bony abnormality identified. 2.  Bilateral iliac stents noted . Electronically Signed   By: Marcello Moores  Register   On: 04/01/2016 15:16        Scheduled Meds: . amLODipine  10 mg Oral Daily  . aspirin EC  81 mg Oral Daily  . enoxaparin (LOVENOX) injection  30 mg Subcutaneous Daily  . metoprolol succinate  25 mg Oral Daily  . ondansetron      .  polyvinyl alcohol  1 drop Both Eyes TID AC & HS  . senna  1 tablet Oral BID  . tiotropium  18 mcg Inhalation Daily   Continuous Infusions:   LOS: 0 days    Dashana Guizar Tanna Furry, MD [facility] Pager [phone]  If 7PM-7AM, please contact night-coverage [email or web address] Password [facility] 04/02/2016, 10:46 AM

## 2016-04-02 NOTE — Progress Notes (Signed)
Patient with multiple questions regarding care and discharge plans. Explained to patient multiple times but patient is not accepting explanation. MD paged and Md stated that he explained the care to the patient and still awaiting ortho . Patient may signed off AMA if she insist to go home. Explained to the patient about MD advice.

## 2016-04-02 NOTE — Evaluation (Signed)
Physical Therapy Evaluation Patient Details Name: [name] [name] MRN: [id number] DOB: Apr 10, 1927 Today's Date: 04/02/2016   History of Present Illness  83 y.o. female with medical history significant of HTN, HLD, PVD s/p iliac stents, CAD, hypothyroidism, osteoarthritis,DDD, s/p laminectomy; who presents after having a fall at home 2 days ago.  X-ray revealed multiple pelvic fractures.  MD felt no surgery warranted and wanted to see how pt did with PT with mobility.  PMH includes HTN, COPD, Smoker.  Clinical Impression  Pt admitted with above diagnosis. Pt currently with functional limitations due to the deficits listed below (see PT Problem List). Pt was able to ambulate with RW with some right hip pain but was able to power through and ambulate without physical assist and good safety overall.  Discussed with pt her assist available at home and pt states that she will have a neighbor to provide all her meals and assist her with ADLs.  She can call anytime she needs anything and neighbors are there.  It would benefit pt to have 3N1 to have beside her recliner and pt agrees.  HHPT, [place] and HHAide recommended as well. Will continue PT until pt d/cs from hospital.    Pt will benefit from skilled PT to increase their independence and safety with mobility to allow discharge to the venue listed below.    Follow Up Recommendations Home health PT;Supervision - Intermittent (HHOT)HHaide    Equipment Recommendations  3in1 (PT)    Recommendations for Other Services       Precautions / Restrictions Precautions Precautions: Fall Restrictions Weight Bearing Restrictions: No RLE Weight Bearing: Weight bearing as tolerated LLE Weight Bearing: Weight bearing as tolerated      Mobility  Bed Mobility Overal bed mobility: Needs Assistance Bed Mobility: Supine to Sit     Supine to sit: Min assist;HOB elevated     General bed mobility comments: Pt needed assist for trunk elevation however pt  states she sleeps in recliner at home.  Transfers Overall transfer level: Needs assistance Equipment used: Rolling walker (2 wheeled) Transfers: Sit to/from Stand Sit to Stand: Supervision         General transfer comment: No assist needed. Pt takes incr time due to pain.  Cued for hand placement.   Ambulation/Gait Ambulation/Gait assistance: Supervision Ambulation Distance (Feet): 23 Feet Assistive device: Rolling walker (2 wheeled) Gait Pattern/deviations: Step-to pattern;Decreased step length - right;Decreased step length - left;Decreased stance time - right;Decreased weight shift to right;Decreased stride length;Antalgic   Gait velocity interpretation: Below normal speed for age/gender General Gait Details: Pt taking small steps due to pain.  Cued initially to not step too far into RW.  Pt was able to demonstrate appropriate technique with RW as she progressed her ambulation.  Pt very cautious.        Balance Overall balance assessment: Needs assistance;History of Falls Sitting-balance support: No upper extremity supported;Feet supported Sitting balance-Leahy Scale: Fair     Standing balance support: Bilateral upper extremity supported;During functional activity Standing balance-Leahy Scale: Poor Standing balance comment: relies on UEs for support due to pain in pelvis and right hip                             Pertinent Vitals/Pain Pain Assessment: 0-10 Pain Score: 5  Pain Location: right hip Pain Descriptors / Indicators: Aching;Grimacing;Guarding Pain Intervention(s): Limited activity within patient's tolerance;Monitored during session;Repositioned    Home Living Family/patient expects to be discharged  to:: Private residence Living Arrangements: Alone Available Help at Discharge: Neighbor;Available PRN/intermittently Type of Home: House Home Access: Stairs to enter Entrance Stairs-Rails: None Entrance Stairs-Number of Steps: 1 Home Layout: One  level Home Equipment: Walker - 2 wheels;Toilet riser;Shower seat;Cane - single point      Prior Function Level of Independence: Independent         Comments: Drives     Hand Dominance   Dominant Hand: Right    Extremity/Trunk Assessment   Upper Extremity Assessment Upper Extremity Assessment: Defer to OT evaluation    Lower Extremity Assessment Lower Extremity Assessment: RLE deficits/detail;LLE deficits/detail RLE Deficits / Details: grossly 3/5 LLE Deficits / Details: grossly 3/5    Cervical / Trunk Assessment Cervical / Trunk Assessment: Normal  Communication   Communication: No difficulties  Cognition Arousal/Alertness: Awake/alert Behavior During Therapy: WFL for tasks assessed/performed Overall Cognitive Status: Within Functional Limits for tasks assessed                                 Assessment/Plan    PT Assessment Patient needs continued PT services  PT Problem List Decreased activity tolerance;Decreased balance;Decreased mobility;Decreased knowledge of use of DME;Decreased safety awareness;Decreased knowledge of precautions;Pain       PT Treatment Interventions DME instruction;Gait training;Functional mobility training;Therapeutic activities;Therapeutic exercise;Balance training;Stair training;Patient/family education    PT Goals (Current goals can be found in the Care Plan section)  Acute Rehab PT Goals Patient Stated Goal: to go home PT Goal Formulation: With patient Time For Goal Achievement: 04/09/16 Potential to Achieve Goals: Good    Frequency Min 5X/week   Barriers to discharge        Co-evaluation               End of Session Equipment Utilized During Treatment: Gait belt Activity Tolerance: Patient limited by fatigue;Patient limited by pain Patient left: in chair;with call bell/phone within reach;with chair alarm set Nurse Communication: Mobility status PT Visit Diagnosis: Unsteadiness on feet (R26.81)     Functional Assessment Tool Used: AM-PAC 6 Clicks Basic Mobility Functional Limitation: Mobility: Walking and moving around Mobility: Walking and Moving Around Current Status [id number]): At least 1 percent but less than 20 percent impaired, limited or restricted Mobility: Walking and Moving Around Goal Status [phone]): At least 1 percent but less than 20 percent impaired, limited or restricted    Time: [id number] PT Time Calculation (min) (ACUTE ONLY): 18 min   Charges:   PT Evaluation $PT Eval Low Complexity: 1 Procedure     PT G Codes:   PT G-Codes **NOT FOR INPATIENT CLASS** Functional Assessment Tool Used: AM-PAC 6 Clicks Basic Mobility Functional Limitation: Mobility: Walking and moving around Mobility: Walking and Moving Around Current Status [id number]): At least 1 percent but less than 20 percent impaired, limited or restricted Mobility: Walking and Moving Around Goal Status [phone]): At least 1 percent but less than 20 percent impaired, limited or restricted     Denice Paradise 04/02/2016, 11:31 AM [facility] Acute Rehabilitation [phone] (pager)

## 2016-04-02 NOTE — ED Notes (Signed)
Pt becoming incr uncomfortable in ED stretcher. Pt insists on sitting up in bed, though I feel as though this would make pain, discomfort worse d/t sacral fractures. Pt continues to be insistent on upright positioning.

## 2016-04-02 NOTE — Progress Notes (Signed)
I have reviewed the CT scans and will be placing a formal consult note later today.  Her WB status will be for TDWB to the RLE.  We will see how she mobilizes and how her pain is over the weekend and our Ortho Trauma specialist will be available Monday for consideration of surgery to stabilize if need be.   Full Consult note upcoming.   Victorino December, MD [facility]

## 2016-04-03 DIAGNOSIS — E785 Hyperlipidemia, unspecified: Secondary | ICD-10-CM | POA: Diagnosis not present

## 2016-04-03 DIAGNOSIS — Z79899 Other long term (current) drug therapy: Secondary | ICD-10-CM | POA: Diagnosis not present

## 2016-04-03 DIAGNOSIS — Y92008 Other place in unspecified non-institutional (private) residence as the place of occurrence of the external cause: Secondary | ICD-10-CM | POA: Diagnosis not present

## 2016-04-03 DIAGNOSIS — Z7982 Long term (current) use of aspirin: Secondary | ICD-10-CM | POA: Diagnosis not present

## 2016-04-03 DIAGNOSIS — S3282XA Multiple fractures of pelvis without disruption of pelvic ring, initial encounter for closed fracture: Secondary | ICD-10-CM | POA: Diagnosis present

## 2016-04-03 DIAGNOSIS — S32810A Multiple fractures of pelvis with stable disruption of pelvic ring, initial encounter for closed fracture: Secondary | ICD-10-CM | POA: Diagnosis not present

## 2016-04-03 DIAGNOSIS — E039 Hypothyroidism, unspecified: Secondary | ICD-10-CM | POA: Diagnosis not present

## 2016-04-03 DIAGNOSIS — J449 Chronic obstructive pulmonary disease, unspecified: Secondary | ICD-10-CM | POA: Diagnosis not present

## 2016-04-03 DIAGNOSIS — R001 Bradycardia, unspecified: Secondary | ICD-10-CM | POA: Diagnosis not present

## 2016-04-03 DIAGNOSIS — S32501A Unspecified fracture of right pubis, initial encounter for closed fracture: Secondary | ICD-10-CM | POA: Diagnosis not present

## 2016-04-03 DIAGNOSIS — Z853 Personal history of malignant neoplasm of breast: Secondary | ICD-10-CM | POA: Diagnosis not present

## 2016-04-03 DIAGNOSIS — R69 Illness, unspecified: Secondary | ICD-10-CM | POA: Diagnosis not present

## 2016-04-03 DIAGNOSIS — Z955 Presence of coronary angioplasty implant and graft: Secondary | ICD-10-CM | POA: Diagnosis not present

## 2016-04-03 DIAGNOSIS — D696 Thrombocytopenia, unspecified: Secondary | ICD-10-CM | POA: Diagnosis not present

## 2016-04-03 DIAGNOSIS — Z87442 Personal history of urinary calculi: Secondary | ICD-10-CM | POA: Diagnosis not present

## 2016-04-03 DIAGNOSIS — S32511A Fracture of superior rim of right pubis, initial encounter for closed fracture: Secondary | ICD-10-CM | POA: Diagnosis not present

## 2016-04-03 DIAGNOSIS — I10 Essential (primary) hypertension: Secondary | ICD-10-CM | POA: Diagnosis not present

## 2016-04-03 DIAGNOSIS — F17210 Nicotine dependence, cigarettes, uncomplicated: Secondary | ICD-10-CM | POA: Diagnosis not present

## 2016-04-03 DIAGNOSIS — M79601 Pain in right arm: Secondary | ICD-10-CM | POA: Diagnosis not present

## 2016-04-03 DIAGNOSIS — M25571 Pain in right ankle and joints of right foot: Secondary | ICD-10-CM | POA: Diagnosis not present

## 2016-04-03 DIAGNOSIS — I739 Peripheral vascular disease, unspecified: Secondary | ICD-10-CM | POA: Diagnosis not present

## 2016-04-03 DIAGNOSIS — Z803 Family history of malignant neoplasm of breast: Secondary | ICD-10-CM | POA: Diagnosis not present

## 2016-04-03 DIAGNOSIS — I2510 Atherosclerotic heart disease of native coronary artery without angina pectoris: Secondary | ICD-10-CM | POA: Diagnosis not present

## 2016-04-03 DIAGNOSIS — W108XXA Fall (on) (from) other stairs and steps, initial encounter: Secondary | ICD-10-CM | POA: Diagnosis not present

## 2016-04-03 LAB — BASIC METABOLIC PANEL
ANION GAP: 8 (ref 5–15)
BUN: 25 mg/dL — AB (ref 6–20)
CO2: 27 mmol/L (ref 22–32)
Calcium: 8.7 mg/dL — ABNORMAL LOW (ref 8.9–10.3)
Chloride: 105 mmol/L (ref 101–111)
Creatinine, Ser: 1.11 mg/dL — ABNORMAL HIGH (ref 0.44–1.00)
GFR, EST AFRICAN AMERICAN: 51 mL/min — AB (ref >60)
GFR, EST NON AFRICAN AMERICAN: 44 mL/min — AB (ref >60)
Glucose, Bld: 88 mg/dL (ref 65–99)
POTASSIUM: 3.6 mmol/L (ref 3.5–5.1)
SODIUM: 140 mmol/L (ref 135–145)

## 2016-04-03 LAB — T3, FREE: T3, Free: 3.6 pg/mL (ref 2.0–4.4)

## 2016-04-03 NOTE — Progress Notes (Signed)
Subjective:    Patient reports pain as 1 on 0-10 scale. Doing well this morning.   Objective: Vital signs in last 24 hours: Temp:  [97.9 F (36.6 C)-98.3 F (36.8 C)] 98.3 F (36.8 C) (03/03 0525) Pulse Rate:  [57-66] 57 (03/03 0525) Resp:  [16] 16 (03/03 0525) BP: (139-154)/(47-52) 154/47 (03/03 0525) SpO2:  [93 %-98 %] 93 % (03/03 0525)  Intake/Output from previous day: 03/02 0701 - 03/03 0700 In: 356 [P.O.:356] Out: 100 [Urine:100] Intake/Output this shift: Total I/O In: -  Out: 220 [Urine:220]   Recent Labs  04/01/16 1548 04/02/16 0103  HGB 12.2 11.4*    Recent Labs  04/01/16 1548 04/02/16 0103  WBC 7.9 6.8  RBC 4.13 3.92  HCT 38.0 36.3  PLT 126* 124*    Recent Labs  04/02/16 0103 04/03/16 0539  NA 138 140  K 3.0* 3.6  CL 104 105  CO2 23 27  BUN 17 25*  CREATININE 1.05* 1.11*  GLUCOSE 150* 88  CALCIUM 8.9 8.7*   No results for input(s): LABPT, INR in the last 72 hours.  Dorsiflexion/Plantar flexion intact  Assessment/Plan:    Waiting for Dr. Marcelino Scot to evaluate on Monday.  Mahek Schlesinger A 04/03/2016, 8:33 AM

## 2016-04-03 NOTE — Progress Notes (Signed)
Physical Therapy Treatment Patient Details Name: [name] [name] MRN: [id number] DOB: April 19, 1927 Today's Date: 04/03/2016    History of Present Illness 83 y.o. female with medical history significant of HTN, HLD, PVD s/p iliac stents, CAD, hypothyroidism, osteoarthritis,DDD, s/p laminectomy; who presents after having a fall at home 2 days ago.  X-ray revealed multiple pelvic fractures.  MD felt no surgery warranted and wanted to see how pt did with PT with mobility.  PMH includes HTN, COPD, Smoker.    PT Comments    Patient is very pleasant, received pain medication just prior to PT arrival.  MIN Guard to MIN assist overall for mobility in room today.  Breathing more labored today with COPD, maintained O2 saturation in low 90's, but low activity tolerance bursts as result.  Patient is 4 days post pelvic fracture trauma, anticipate pain levels to start getting better soon.  Patient remains as good rehab candidate and will remain on PT roster.  Follow Up Recommendations  Home health PT;Supervision - Intermittent (HHOT; [facility].  Patient had [facility] before = good)     Equipment Recommendations  3in1 (PT)    Recommendations for Other Services       Precautions / Restrictions Precautions Precautions: Fall Restrictions Weight Bearing Restrictions: Yes RLE Weight Bearing: Touchdown weight bearing LLE Weight Bearing: Weight bearing as tolerated    Mobility  Bed Mobility Overal bed mobility: Needs Assistance Bed Mobility: Supine to Sit     Supine to sit: Min assist;HOB elevated     General bed mobility comments: Pt needed assist for trunk elevation however pt states she sleeps in recliner at home.  Transfers Overall transfer level: Needs assistance Equipment used: Rolling walker (2 wheeled) Transfers: Sit to/from Stand Sit to Stand: Min assist         General transfer comment: Mild assist and cues needed. Pt takes incr time due to pain.      Ambulation/Gait Ambulation/Gait assistance: Min guard Ambulation Distance (Feet): 10 Feet (2x) Assistive device: Rolling walker (2 wheeled) Gait Pattern/deviations: Step-to pattern;Decreased step length - right;Decreased step length - left;Decreased stance time - right;Decreased weight shift to right;Decreased stride length;Antalgic   Gait velocity interpretation: Below normal speed for age/gender General Gait Details: Pt taking small steps due to pain.  Cued initially to not step too far into RW.  Pt was able to demonstrate appropriate technique with RW as she progressed her ambulation.  Pt very cautious.     Stairs            Wheelchair Mobility    Modified Rankin (Stroke Patients Only)       Balance Overall balance assessment: Needs assistance;History of Falls Sitting-balance support: No upper extremity supported;Feet supported Sitting balance-Leahy Scale: Fair     Standing balance support: Bilateral upper extremity supported;During functional activity Standing balance-Leahy Scale: Poor Standing balance comment: relies on UEs for support due to pain in pelvis and right hip                    Cognition Arousal/Alertness: Awake/alert Behavior During Therapy: WFL for tasks assessed/performed Overall Cognitive Status: Within Functional Limits for tasks assessed                      Exercises      General Comments General comments (skin integrity, edema, etc.): COPD, breathing ok on room air, low 90's saturation.  Breathing impacted performance today.      Pertinent Vitals/Pain Pain Assessment: 0-10 Pain Score:  6  Pain Location: right hip Pain Descriptors / Indicators: Aching;Grimacing;Guarding Pain Intervention(s): RN gave pain meds during session;Monitored during session;Limited activity within patient's tolerance    Home Living                      Prior Function            PT Goals (current goals can now be found in the care  plan section) Acute Rehab PT Goals Patient Stated Goal: to go home PT Goal Formulation: With patient Time For Goal Achievement: 04/09/16 Potential to Achieve Goals: Good Progress towards PT goals: Not progressing toward goals - comment (Pain limiting function today, patient motivated still)    Frequency    Min 5X/week      PT Plan Current plan remains appropriate    Co-evaluation             End of Session Equipment Utilized During Treatment: Gait belt Activity Tolerance: Patient limited by fatigue;Patient limited by pain Patient left: in bed;with call bell/phone within reach Nurse Communication: Mobility status PT Visit Diagnosis: Unsteadiness on feet (R26.81)     Time: [id number] PT Time Calculation (min) (ACUTE ONLY): 35 min  Charges:  $Gait Training: 8-22 mins $Therapeutic Activity: 8-22 mins                    G Codes:       [name] [name] L 2016/04/26, 10:31 AM

## 2016-04-03 NOTE — Progress Notes (Signed)
PROGRESS NOTE    [name] [name]  [id number] DOB: 10-24-1927 DOA: 04/01/2016 PCP: Lottie Dawson, MD   Brief Narrative: 83 y.o. female with medical history significant of HTN, HLD, PVD s/p iliac stents, CAD, hypothyroidism, osteoarthritis,DDD, s/p laminectomy; who presents after having a fall at home 2 days ago. Imaging studies of the right hip and pelvis revealed multiple pelvic fractures. Dr. Victorino December of orthopedics was consulted, but recommended admission to [facility] with nonweightbearing until evaluation by physical therapy. Assessment & Plan:    # Multiple pelvic fractures ([place]) secondary due to fall.  -Also consult evaluated the patient. Waiting for Dr. Carlean Jews evaluation on Monday as per orthopedics team. For now continue pain management of physical therapy and supportive care. -I discussed with the patient and she agreed with the plan. -TSH elevated, will check free t3 and free T4. Needs PCP follow ups.   # Essential hypertension, benign: Blood pressure acceptable. Continue amlodipine and metoprolol. Patient has mild pericardia. Continue to monitor heart rate.  # COPD (chronic obstructive pulmonary disease) ([place]): Stable continue Spiriva and breathing treatment as needed    #Thrombocytopenia ([place]), chronic: No sign of bleeding. Continue to monitor. Repeat CBC in the morning.    # TOBACCO USE: Education provided to the patient.   DVT prophylaxis: Lovenox subcutaneous Code Status: Full code Family Communication: No family present at bedside Disposition Plan: Likely discharge home with home care services. Patient declined to go to rehabilitation center or SNF    Consultants:   Orthopedics  Procedures: None Antimicrobials: None  Subjective: Patient was seen and examined at bedside. No new event. Denied nausea vomiting chest pain or shortness of breath. Denies pain. Objective: Vitals:   04/02/16 0149 04/02/16 1235 04/02/16 2221 04/03/16 0525  BP: (!) 147/58  (!) 139/47 (!) 141/52 (!) 154/47  Pulse: (!) 58 66 64 (!) 57  Resp: 17  16 16   Temp: 98 F (36.7 C) 98.1 F (36.7 C) 97.9 F (36.6 C) 98.3 F (36.8 C)  TempSrc: Oral Oral Oral Oral  SpO2: 97% 98% 97% 93%  Weight: 47.6 kg (104 lb 14.4 oz)       Intake/Output Summary (Last 24 hours) at 04/03/16 1257 Last data filed at 04/03/16 0746  Gross per 24 hour  Intake              120 ml  Output              220 ml  Net             -100 ml   Filed Weights   04/02/16 0149  Weight: 47.6 kg (104 lb 14.4 oz)    Examination:  General exam: Elderly female lying on bed comfortable, not in distress.  Respiratory system: Clear bilateral. Respiratory effort normal. No wheezing or crackle Cardiovascular system: Regular rate rhythm, S1-S2 normal.  No pedal edema. Gastrointestinal system: Abdomen is nondistended, soft and nontender. Normal bowel sounds heard. Central nervous system: Alert, awake, oriented. No focal neurological sign. Extremities: Able to move lower extremities  Skin: No rashes, lesions or ulcers Psychiatry: Judgement and insight appear normal. Mood & affect appropriate.     Data Reviewed: I have personally reviewed following labs and imaging studies  CBC:  Recent Labs Lab 04/01/16 1548 04/02/16 0103  WBC 7.9 6.8  NEUTROABS 5.6  --   HGB 12.2 11.4*  HCT 38.0 36.3  MCV 92.0 92.6  PLT 126* [id number]*   Basic Metabolic Panel:  Recent Labs Lab 04/01/16 1548 04/02/16  0103 04/03/16 0539  NA 140 138 140  K 3.5 3.0* 3.6  CL 107 104 105  CO2 26 23 27   GLUCOSE 97 150* 88  BUN 17 17 25*  CREATININE 0.96 1.05* 1.11*  CALCIUM 9.0 8.9 8.7*   GFR: Estimated Creatinine Clearance: 27.3 mL/min (by C-G formula based on SCr of 1.11 mg/dL (H)). Liver Function Tests: No results for input(s): AST, ALT, ALKPHOS, BILITOT, PROT, ALBUMIN in the last 168 hours. No results for input(s): LIPASE, AMYLASE in the last 168 hours. No results for input(s): AMMONIA in the last 168  hours. Coagulation Profile: No results for input(s): INR, PROTIME in the last 168 hours. Cardiac Enzymes: No results for input(s): CKTOTAL, CKMB, CKMBINDEX, TROPONINI in the last 168 hours. BNP (last 3 results) No results for input(s): PROBNP in the last 8760 hours. HbA1C: No results for input(s): HGBA1C in the last 72 hours. CBG: No results for input(s): GLUCAP in the last 168 hours. Lipid Profile: No results for input(s): CHOL, HDL, LDLCALC, TRIG, CHOLHDL, LDLDIRECT in the last 72 hours. Thyroid Function Tests:  Recent Labs  04/02/16 0103 04/02/16 0844 04/02/16 0845  TSH 8.334*  --   --   FREET4  --  1.11  --   T3FREE  --   --  3.6   Anemia Panel: No results for input(s): VITAMINB12, FOLATE, FERRITIN, TIBC, IRON, RETICCTPCT in the last 72 hours. Sepsis Labs: No results for input(s): PROCALCITON, LATICACIDVEN in the last 168 hours.  No results found for this or any previous visit (from the past 240 hour(s)).       Radiology Studies: Dg Forearm Right  Result Date: 04/01/2016 CLINICAL DATA:  83 y/o F; status post fall 2 days ago with abrasions on the anterior right forearm just distal to midshaft. EXAM: RIGHT FOREARM - 2 VIEW COMPARISON:  None. FINDINGS: There is no evidence of fracture or other focal bone lesions. Soft tissues are unremarkable. IMPRESSION: No acute fracture or dislocation identified. Electronically Signed   By: Kristine Garbe M.D.   On: 04/01/2016 15:13   Ct Pelvis Wo Contrast  Result Date: 04/01/2016 CLINICAL DATA:  Fall 3 days ago with known right iliac and sacral fractures, full pelvis CT for further evaluation. EXAM: CT PELVIS WITHOUT CONTRAST TECHNIQUE: Multidetector CT imaging of the pelvis was performed following the standard protocol without intravenous contrast. COMPARISON:  None. FINDINGS: Urinary Tract: Bladder is partially decompressed. No definitive filling defect is seen. Large right renal cyst is noted. It measures approximately 5.1  cm. Bowel:  No obstructive changes are seen. Vascular/Lymphatic: Diffuse aortic calcifications are noted. There are changes consistent with iliac stents bilaterally. Reproductive: The uterus has been surgically removed. No pelvic mass lesion is noted. Other: No significant hematoma is identified. No free pelvic fluid is noted. Musculoskeletal: The previously seen superior and inferior pubic ramus fractures are again identified and stable. The known right iliac bone fracture extending into the sacroiliac joint is again seen with medial displacement of the more anterior portion of the iliac bone. The fracture extends superiorly in the iliac bone all the way to the lateral aspect of the iliac wing. The more superior component is less displaced than the inferior component. Minimal sacral fracture is noted on the right similar to that seen on the prior exam. No definitive fracture is noted in the more superior aspect of the sacrum. Postsurgical changes are noted in the lower lumbar spine. No other definitive fractures are seen. IMPRESSION: The known right iliac bone extends  superiorly into the lateral aspect of the iliac wing but is not significantly displaced superiorly. No new focal fractures are seen. The known the small sacral fracture on the right is noted and unchanged. The pubic ramus fractures are unchanged as well. Electronically Signed   By: Inez Catalina M.D.   On: 04/01/2016 19:39   Dg Pelvis Comp Min 3v  Result Date: 04/02/2016 CLINICAL DATA:  Right pelvic fractures. EXAM: [facility]ET PELVIS - 3+ VIEW COMPARISON:  04/01/2016 pelvic CT. FINDINGS: Inlet, outlet and frontal views of the pelvis were obtained. Re- demonstrated are fractures of the medial superior and inferior right pubic rami involving the right pubic body, with minimal 4 mm inferior displacement of the medial fracture fragment on the inlet view. There is limited visualization of a comminuted fracture involving the medial right iliac bone extending  to the lower right sacroiliac joint, with extension of the fracture into the right iliac wing, without appreciable displacement on these views. Minimal diastasis of the lower right sacroiliac joint . No hip dislocation. Vascular stents overlie the atherosclerotic italic iliac vessels. IMPRESSION: 1. Minimally displaced fractures of the medial superior and inferior right pubic rami involving the right pubic body. 2. Limited visualization of comminuted medial right iliac bone fracture extending to the lower right sacroiliac joint and right iliac wing, without appreciable displacement. 3. Minimal diastasis of the lower right sacroiliac joint. Electronically Signed   By: Ilona Sorrel M.D.   On: 04/02/2016 16:35   Ct Hip Right Wo Contrast  Result Date: 04/01/2016 CLINICAL DATA:  Fall at home 3 days ago. Right hip pain and difficulty bearing weight. EXAM: CT OF THE RIGHT HIP WITHOUT CONTRAST TECHNIQUE: Multidetector CT imaging of the right hip was performed according to the standard protocol. Multiplanar CT image reconstructions were also generated. COMPARISON:  04/01/2016 radiographs FINDINGS: Bones/Joint/Cartilage Vertical fracture in the right iliac bone extending into the sacroiliac joint, with a fracture of the anterior margin of the sacrum shown on the top most image number 1/5. Neither the sacral nor the iliac bone fracture are completely included on this CT of the hip which does not include the entire bony pelvis. The upper portion of the right iliac bone is displaced 8 mm medially with respect to the lower portion of the right iliac bone. Mildly comminuted fracture of the inferior pubic ramus extending into the pubic body and adjacent superior pubic ramus, images 229-280 8/5. Ligaments Suboptimally assessed by CT. Muscles and Tendons Unremarkable Soft tissues Presacral edema and aches edema extending in the sciatic notch. No direct bony impingement on the sciatic nerve is seen. IMPRESSION: 1. Mildly displaced  fracture through the right iliac bone extending into the lower part of the right SI joint, and involving part of the adjacent sacrum ; with comminuted fractures of the right superior and inferior pubic ramus extending into the pubic body. The iliac bone fracture is only partially included on today's exam which was focused on the hip rather than the entire bony pelvis. Based on the overall appearance I expect that this is a [place] type 2 lateral compression injury. It would probably be prudent to re- scan the entire bony pelvis to fully characterize the pelvic fractures. Electronically Signed   By: Van Clines M.D.   On: 04/01/2016 17:07   Dg Chest Port 1 View  Result Date: 04/01/2016 CLINICAL DATA:  Preop. EXAM: PORTABLE CHEST 1 VIEW COMPARISON:  11/12/2015 FINDINGS: There is hyperinflation of the lungs compatible with COPD. Heart is  normal size. No confluent airspace opacities or effusions. No acute bony abnormality. Surgical clips in the right axilla. IMPRESSION: COPD.  No active disease. Electronically Signed   By: Rolm Baptise M.D.   On: 04/01/2016 15:27   Dg Hip Unilat  With Pelvis 2-3 Views Right  Result Date: 04/01/2016 CLINICAL DATA:  Fall. EXAM: DG HIP (WITH OR WITHOUT PELVIS) 2-3V RIGHT COMPARISON:  CT 07/26/2008 . FINDINGS: Degenerative changes lumbar spine and both hips. Deformity noted of the right pubis, unchanged from prior CT of 07/26/2008, consistent with old injury.No acute bony abnormality identified. No evidence of fracture. Bilateral iliac stents. Pelvic calcifications consistent with phleboliths. IMPRESSION: 1. Diffuse osteopenia and degenerative change. No acute bony abnormality identified. 2.  Bilateral iliac stents noted . Electronically Signed   By: Marcello Moores  Register   On: 04/01/2016 15:16        Scheduled Meds: . amLODipine  10 mg Oral Daily  . aspirin EC  81 mg Oral Daily  . enoxaparin (LOVENOX) injection  30 mg Subcutaneous Daily  . metoprolol succinate   25 mg Oral Daily  . polyvinyl alcohol  1 drop Both Eyes TID AC & HS  . senna  1 tablet Oral BID  . tiotropium  18 mcg Inhalation Daily   Continuous Infusions:   LOS: 0 days    Rayn Enderson Tanna Furry, MD [facility] Pager [phone]  If 7PM-7AM, please contact night-coverage [email or web address] Password [facility] 04/03/2016, 12:57 PM

## 2016-04-04 DIAGNOSIS — S3282XA Multiple fractures of pelvis without disruption of pelvic ring, initial encounter for closed fracture: Secondary | ICD-10-CM | POA: Diagnosis not present

## 2016-04-04 DIAGNOSIS — D696 Thrombocytopenia, unspecified: Secondary | ICD-10-CM | POA: Diagnosis not present

## 2016-04-04 DIAGNOSIS — I10 Essential (primary) hypertension: Secondary | ICD-10-CM | POA: Diagnosis not present

## 2016-04-04 DIAGNOSIS — S32810A Multiple fractures of pelvis with stable disruption of pelvic ring, initial encounter for closed fracture: Secondary | ICD-10-CM | POA: Diagnosis not present

## 2016-04-04 LAB — CBC
HCT: 39.3 % (ref 36.0–46.0)
HEMOGLOBIN: 12.4 g/dL (ref 12.0–15.0)
MCH: 30.1 pg (ref 26.0–34.0)
MCHC: 31.6 g/dL (ref 30.0–36.0)
MCV: 95.4 fL (ref 78.0–100.0)
Platelets: 124 10*3/uL — ABNORMAL LOW (ref 150–400)
RBC: 4.12 MIL/uL (ref 3.87–5.11)
RDW: 14.8 % (ref 11.5–15.5)
WBC: 6.2 10*3/uL (ref 4.0–10.5)

## 2016-04-04 NOTE — Progress Notes (Signed)
    Subjective:    Patient reports pain as 2 on 0-10 scale.   Denies CP or SOB.  Voiding without difficulty. Positive flatus. Objective: Vital signs in last 24 hours: Temp:  [97.4 F (36.3 C)-98.5 F (36.9 C)] 98.5 F (36.9 C) (03/04 0422) Pulse Rate:  [53-58] 56 (03/04 0422) Resp:  [16-17] 16 (03/04 0422) BP: (129-141)/(44-57) 129/57 (03/04 0422) SpO2:  [97 %-99 %] 98 % (03/04 0422)  Intake/Output from previous day: 03/03 0701 - 03/04 0700 In: 700 [P.O.:700] Out: 795 [Urine:795] Intake/Output this shift: No intake/output data recorded.  Labs:  Recent Labs  04/01/16 1548 04/02/16 0103 04/04/16 0431  HGB 12.2 11.4* 12.4    Recent Labs  04/02/16 0103 04/04/16 0431  WBC 6.8 6.2  RBC 3.92 4.12  HCT 36.3 39.3  PLT 124* 124*    Recent Labs  04/02/16 0103 04/03/16 0539  NA 138 140  K 3.0* 3.6  CL 104 105  CO2 23 27  BUN 17 25*  CREATININE 1.05* 1.11*  GLUCOSE 150* 88  CALCIUM 8.9 8.7*   No results for input(s): LABPT, INR in the last 72 hours.  Physical Exam: Neurologically intact Intact pulses distally No cellulitis present Compartment soft  Assessment/Plan:    Up with therapy  Await further recommendation tomorrow from Drs Stann Mainland and Marcelino Scot No new changes  [name] [name] D for Dr. Melina [name] [facility] [phone] 04/04/2016, 7:57 AM

## 2016-04-04 NOTE — Progress Notes (Signed)
Physical Therapy Treatment Patient Details Name: [name] [name] MRN: [id number] DOB: 11-17-1927 Today's Date: 04/04/2016    History of Present Illness 83 y.o. female with medical history significant of HTN, HLD, PVD s/p iliac stents, CAD, hypothyroidism, osteoarthritis,DDD, s/p laminectomy; who presents after having a fall at home 2 days ago.  X-ray revealed multiple pelvic fractures.  MD felt no surgery warranted and wanted to see how pt did with PT with mobility.  PMH includes HTN, COPD, Smoker.    PT Comments    Noting slow, but present, progress towards goals; activity tolerance is increased, and [name] [name] is able to keep TDWB RLE well with RW;   She is able to name many friends and neighbors who can help her at home; Will need to practice the one step she has to enter her home;   Also will make sure to note any additional recommendations from Dr. Marcelino [name]    Follow Up Recommendations  Home health PT;Supervision - Intermittent ([name] [name], [name] [name]; liked working with [name] [name] previously)     Equipment Recommendations  3in1 (PT)    Recommendations for Other Services OT consult     Precautions / Restrictions Precautions Precautions: Fall Restrictions Weight Bearing Restrictions: Yes RLE Weight Bearing: Touchdown weight bearing LLE Weight Bearing: Weight bearing as tolerated    Mobility  Bed Mobility               General bed mobility comments: pt was sitting EOB at beginning of session  Transfers Overall transfer level: Needs assistance Equipment used: Rolling walker (2 wheeled) Transfers: Sit to/from Stand Sit to Stand: Min assist         General transfer comment: Mild assist and cues needed. Pt takes incr time due to pain.     Ambulation/Gait Ambulation/Gait assistance: Min guard Ambulation Distance (Feet): 15 Feet Assistive device: Rolling walker (2 wheeled) Gait Pattern/deviations: Step-to pattern;Decreased step length - right;Decreased step length -  left;Decreased stance time - right;Decreased weight shift to right;Decreased stride length;Antalgic     General Gait Details: Pt taking small steps due to pain.  Cued initially to not step too far into RW.  Pt was able to demonstrate appropriate technique with RW as she progressed her ambulation.  Pt very cautious.     Stairs            Wheelchair Mobility    Modified Rankin (Stroke Patients Only)       Balance     Sitting balance-Leahy Scale: Fair       Standing balance-Leahy Scale: Poor Standing balance comment: relies on UEs for support due to pain in pelvis and right hip                    Cognition Arousal/Alertness: Awake/alert Behavior During Therapy: WFL for tasks assessed/performed Overall Cognitive Status: Within Functional Limits for tasks assessed                      Exercises      General Comments General comments (skin integrity, edema, etc.): Session conducted on Room Air, and O2 sats remained greater than or equal to 94%      Pertinent Vitals/Pain Pain Assessment: Faces Faces Pain Scale: Hurts even more Pain Location: R groin with movement; tends to use hands to assist R thigh when moving in bed Pain Descriptors / Indicators: Aching;Grimacing;Guarding Pain Intervention(s): Premedicated before session;Monitored during session    Home Living  Prior Function            PT Goals (current goals can now be found in the care plan section) Acute Rehab PT Goals Patient Stated Goal: to go home PT Goal Formulation: With patient Time For Goal Achievement: 04/09/16 Potential to Achieve Goals: Good Progress towards PT goals: Progressing toward goals    Frequency    Min 5X/week      PT Plan Current plan remains appropriate    Co-evaluation             End of Session Equipment Utilized During Treatment: Gait belt Activity Tolerance: Patient limited by fatigue Patient left: in chair;with  call bell/phone within reach Nurse Communication: Mobility status PT Visit Diagnosis: Unsteadiness on feet (R26.81);Pain Pain - Right/Left: Right Pain - part of body:  (groin)     Time: [id number] PT Time Calculation (min) (ACUTE ONLY): 19 min  Charges:  $Gait Training: 8-22 mins                    G Codes:       [name] [name] 2016/04/15, 1:11 PM  [name] [name], [place] Pager [phone] Office [phone]

## 2016-04-04 NOTE — Progress Notes (Signed)
PROGRESS NOTE    ALISSA KNECHT  [id number] DOB: 04/21/1927 DOA: 04/01/2016 PCP: Lottie Dawson, MD   Brief Narrative: 83 y.o. female with medical history significant of HTN, HLD, PVD s/p iliac stents, CAD, hypothyroidism, osteoarthritis,DDD, s/p laminectomy; who presents after having a fall at home 2 days ago. Imaging studies of the right hip and pelvis revealed multiple pelvic fractures. Dr. Victorino December of orthopedics was consulted, but recommended admission to [facility] with nonweightbearing until evaluation by physical therapy. Assessment & Plan:    # Multiple pelvic fractures ([place]) secondary due to fall.  -Also consult evaluated the patient. Waiting for Dr. Carlean Jews evaluation on Monday as per orthopedics team. For now continue pain management of physical therapy and supportive care. -I discussed with the patient and she agreed with the plan. -TSH elevated with normal free t3 and free T4. Needs PCP follow ups.   # Essential hypertension, benign: Blood pressure acceptable. Continue amlodipine and metoprolol. Patient has  borderline bradycardia at night. Continue to monitor heart rate.  # COPD (chronic obstructive pulmonary disease) ([place]): Stable continue Spiriva and breathing treatment as needed    #Thrombocytopenia ([place]), chronic: No sign of bleeding. Continue to monitor. Repeat CBC in the morning.    # TOBACCO USE: Education provided to the patient.   DVT prophylaxis: Lovenox subcutaneous Code Status: Full code Family Communication: No family present at bedside Disposition Plan: Likely discharge home with home care services. Patient declined to go to rehabilitation center or SNF    Consultants:   Orthopedics  Procedures: None Antimicrobials: None  Subjective: Patient was seen and examined at bedside. No new event. Denied nausea vomiting chest pain, shortness of breath.  Objective: Vitals:   04/03/16 1506 04/03/16 2317 04/04/16 0422 04/04/16 1004  BP:  (!)  141/44 (!) 129/57 (!) 132/57  Pulse: (!) 58 (!) 53 (!) 56 71  Resp: 16 17 16    Temp: 97.9 F (36.6 C) 97.4 F (36.3 C) 98.5 F (36.9 C)   TempSrc: Oral Oral Oral   SpO2: 97% 99% 98% 94%  Weight:        Intake/Output Summary (Last 24 hours) at 04/04/16 1246 Last data filed at 04/04/16 [id number]  Gross per 24 hour  Intake              700 ml  Output              575 ml  Net              125 ml   Filed Weights   04/02/16 0149  Weight: 47.6 kg (104 lb 14.4 oz)    Examination:  General exam: Elderly pleasant female sitting on bed comfortable Respiratory system: Clear bilateral. Respiratory effort normal. No wheezing or crackle Cardiovascular system: Regular rate rhythm, S1-S2 normal.  No pedal edema. Gastrointestinal system: Abdomen is nondistended, soft and nontender. Normal bowel sounds heard. Central nervous system: Alert, awake, oriented. No focal neurological sign. Extremities: Able to move lower extremities  Skin: No rashes, lesions or ulcers Psychiatry: Judgement and insight appear normal. Mood & affect appropriate.     Data Reviewed: I have personally reviewed following labs and imaging studies  CBC:  Recent Labs Lab 04/01/16 1548 04/02/16 0103 04/04/16 0431  WBC 7.9 6.8 6.2  NEUTROABS 5.6  --   --   HGB 12.2 11.4* 12.4  HCT 38.0 36.3 39.3  MCV 92.0 92.6 95.4  PLT 126* 124* [id number]*   Basic Metabolic Panel:  Recent Labs Lab 04/01/16 1548  04/02/16 0103 04/03/16 0539  NA 140 138 140  K 3.5 3.0* 3.6  CL 107 104 105  CO2 26 23 27   GLUCOSE 97 150* 88  BUN 17 17 25*  CREATININE 0.96 1.05* 1.11*  CALCIUM 9.0 8.9 8.7*   GFR: Estimated Creatinine Clearance: 27.3 mL/min (by C-G formula based on SCr of 1.11 mg/dL (H)). Liver Function Tests: No results for input(s): AST, ALT, ALKPHOS, BILITOT, PROT, ALBUMIN in the last 168 hours. No results for input(s): LIPASE, AMYLASE in the last 168 hours. No results for input(s): AMMONIA in the last 168 hours. Coagulation  Profile: No results for input(s): INR, PROTIME in the last 168 hours. Cardiac Enzymes: No results for input(s): CKTOTAL, CKMB, CKMBINDEX, TROPONINI in the last 168 hours. BNP (last 3 results) No results for input(s): PROBNP in the last 8760 hours. HbA1C: No results for input(s): HGBA1C in the last 72 hours. CBG: No results for input(s): GLUCAP in the last 168 hours. Lipid Profile: No results for input(s): CHOL, HDL, LDLCALC, TRIG, CHOLHDL, LDLDIRECT in the last 72 hours. Thyroid Function Tests:  Recent Labs  04/02/16 0103 04/02/16 0844 04/02/16 0845  TSH 8.334*  --   --   FREET4  --  1.11  --   T3FREE  --   --  3.6   Anemia Panel: No results for input(s): VITAMINB12, FOLATE, FERRITIN, TIBC, IRON, RETICCTPCT in the last 72 hours. Sepsis Labs: No results for input(s): PROCALCITON, LATICACIDVEN in the last 168 hours.  No results found for this or any previous visit (from the past 240 hour(s)).       Radiology Studies: Dg Pelvis Comp Min 3v  Result Date: 04/02/2016 CLINICAL DATA:  Right pelvic fractures. EXAM: [facility] PELVIS - 3+ VIEW COMPARISON:  04/01/2016 pelvic CT. FINDINGS: Inlet, outlet and frontal views of the pelvis were obtained. Re- demonstrated are fractures of the medial superior and inferior right pubic rami involving the right pubic body, with minimal 4 mm inferior displacement of the medial fracture fragment on the inlet view. There is limited visualization of a comminuted fracture involving the medial right iliac bone extending to the lower right sacroiliac joint, with extension of the fracture into the right iliac wing, without appreciable displacement on these views. Minimal diastasis of the lower right sacroiliac joint . No hip dislocation. Vascular stents overlie the atherosclerotic italic iliac vessels. IMPRESSION: 1. Minimally displaced fractures of the medial superior and inferior right pubic rami involving the right pubic body. 2. Limited visualization of  comminuted medial right iliac bone fracture extending to the lower right sacroiliac joint and right iliac wing, without appreciable displacement. 3. Minimal diastasis of the lower right sacroiliac joint. Electronically Signed   By: Ilona Sorrel M.D.   On: 04/02/2016 16:35        Scheduled Meds: . amLODipine  10 mg Oral Daily  . aspirin EC  81 mg Oral Daily  . enoxaparin (LOVENOX) injection  30 mg Subcutaneous Daily  . metoprolol succinate  25 mg Oral Daily  . polyvinyl alcohol  1 drop Both Eyes TID AC & HS  . senna  1 tablet Oral BID  . tiotropium  18 mcg Inhalation Daily   Continuous Infusions:   LOS: 1 day    Elyon Zoll Tanna Furry, MD [facility] Pager [phone]  If 7PM-7AM, please contact night-coverage [email or web address] Password [facility] 04/04/2016, 12:46 PM

## 2016-04-05 ENCOUNTER — Inpatient Hospital Stay (HOSPITAL_COMMUNITY): Payer: MEDICARE

## 2016-04-05 DIAGNOSIS — I10 Essential (primary) hypertension: Secondary | ICD-10-CM | POA: Diagnosis not present

## 2016-04-05 DIAGNOSIS — S32810A Multiple fractures of pelvis with stable disruption of pelvic ring, initial encounter for closed fracture: Secondary | ICD-10-CM | POA: Diagnosis not present

## 2016-04-05 DIAGNOSIS — S32511A Fracture of superior rim of right pubis, initial encounter for closed fracture: Secondary | ICD-10-CM | POA: Diagnosis not present

## 2016-04-05 DIAGNOSIS — D696 Thrombocytopenia, unspecified: Secondary | ICD-10-CM | POA: Diagnosis not present

## 2016-04-05 DIAGNOSIS — S3282XA Multiple fractures of pelvis without disruption of pelvic ring, initial encounter for closed fracture: Secondary | ICD-10-CM | POA: Diagnosis not present

## 2016-04-05 DIAGNOSIS — S32501A Unspecified fracture of right pubis, initial encounter for closed fracture: Secondary | ICD-10-CM | POA: Diagnosis not present

## 2016-04-05 MED ORDER — ENOXAPARIN SODIUM 30 MG/0.3ML ~~LOC~~ SOLN: 30.0000 mg | SUBCUTANEOUS | 0 refills | Status: DC

## 2016-04-05 MED ORDER — ACETAMINOPHEN 500 MG PO TABS: 500.0000 mg | ORAL_TABLET | Freq: Four times a day (QID) | ORAL | 0 refills | Status: DC | PRN

## 2016-04-05 NOTE — Discharge Instructions (Signed)
Orthopaedic Trauma Service Discharge Instructions   General Discharge Instructions  WEIGHT BEARING STATUS: Touchdown weightbearing right leg and weightbearing as tolerated left leg with use of walker  RANGE OF MOTION/ACTIVITY: Unrestricted range of motion bilateral lower extremities   PAIN MEDICATION USE AND EXPECTATIONS  You have likely been given narcotic medications to help control your pain.  After a traumatic event that results in an fracture (broken bone) with or without surgery, it is ok to use narcotic pain medications to help control one's pain.  We understand that everyone responds to pain differently and each individual patient will be evaluated on a regular basis for the continued need for narcotic medications. Ideally, narcotic medication use should last no more than 6-8 weeks (coinciding with fracture healing).   As a patient it is your responsibility as well to monitor narcotic medication use and report the amount and frequency you use these medications when you come to your office visit.   We would also advise that if you are using narcotic medications, you should take a dose prior to therapy to maximize you participation.  IF YOU ARE ON NARCOTIC MEDICATIONS IT IS NOT PERMISSIBLE TO OPERATE A MOTOR VEHICLE (MOTORCYCLE/CAR/TRUCK/MOPED) OR HEAVY MACHINERY DO NOT MIX NARCOTICS WITH OTHER CNS (CENTRAL NERVOUS SYSTEM) DEPRESSANTS SUCH AS ALCOHOL  Diet: as you were eating previously.  Can use over the counter stool softeners and bowel preparations, such as Miralax, to help with bowel movements.  Narcotics can be constipating.  Be sure to drink plenty of fluids    STOP SMOKING OR USING NICOTINE PRODUCTS!!!!  As discussed nicotine severely impairs your body's ability to heal surgical and traumatic wounds but also impairs bone healing.  Wounds and bone heal by forming microscopic blood vessels (angiogenesis) and nicotine is a vasoconstrictor (essentially, shrinks blood vessels).   Therefore, if vasoconstriction occurs to these microscopic blood vessels they essentially disappear and are unable to deliver necessary nutrients to the healing tissue.  This is one modifiable factor that you can do to dramatically increase your chances of healing your injury.    (This means no smoking, no nicotine gum, patches, etc)  DO NOT USE NONSTEROIDAL ANTI-INFLAMMATORY DRUGS (NSAID'S)  Using products such as Advil (ibuprofen), Aleve (naproxen), Motrin (ibuprofen) for additional pain control during fracture healing can delay and/or prevent the healing response.  If you would like to take over the counter (OTC) medication, Tylenol (acetaminophen) is ok.  However, some narcotic medications that are given for pain control contain acetaminophen as well. Therefore, you should not exceed more than 4000 mg of tylenol in a day if you do not have liver disease.  Also note that there are may OTC medicines, such as cold medicines and allergy medicines that my contain tylenol as well.  If you have any questions about medications and/or interactions please ask your doctor/PA or your pharmacist.      ICE AND ELEVATE INJURED/OPERATIVE EXTREMITY  Using ice and elevating the injured extremity above your heart can help with swelling and pain control.  Icing in a pulsatile fashion, such as 20 minutes on and 20 minutes off, can be followed.    Do not place ice directly on skin. Make sure there is a barrier between to skin and the ice pack.    Using frozen items such as frozen peas works well as the conform nicely to the are that needs to be iced.  USE AN ACE WRAP OR TED HOSE FOR SWELLING CONTROL  In addition to icing and  elevation, Ace wraps or TED hose are used to help limit and resolve swelling.  It is recommended to use Ace wraps or TED hose until you are informed to stop.    When using Ace Wraps start the wrapping distally (farthest away from the body) and wrap proximally (closer to the body)   Example: If you  had surgery on your leg or thing and you do not have a splint on, start the ace wrap at the toes and work your way up to the thigh        If you had surgery on your upper extremity and do not have a splint on, start the ace wrap at your fingers and work your way up to the upper arm  IF YOU ARE IN A SPLINT OR CAST DO NOT [place]   If your splint gets wet for any reason please contact the office immediately. You may shower in your splint or cast as long as you keep it dry.  This can be done by wrapping in a cast cover or garbage back (or similar)  Do Not stick any thing down your splint or cast such as pencils, money, or hangers to try and scratch yourself with.  If you feel itchy take benadryl as prescribed on the bottle for itching  IF YOU ARE IN A CAM BOOT (BLACK BOOT)  You may remove boot periodically. Perform daily dressing changes as noted below.  Wash the liner of the boot regularly and wear a sock when wearing the boot. It is recommended that you sleep in the boot until told otherwise  CALL THE OFFICE WITH ANY QUESTIONS OR CONCERNS: [phone]

## 2016-04-05 NOTE — Discharge Summary (Signed)
Physician Discharge Summary  [name] [name] [id number] DOB: 14-Sep-1927 DOA: 04/01/2016  PCP: Lottie Dawson, MD  Admit date: 04/01/2016 Discharge date: 04/05/2016  Admitted From:Home Disposition:Home with home care services (pt declined rehab or SNF on discharge)  Recommendations for Outpatient Follow-up:  1. Follow up with PCP and orthopedics in 1-2 weeks 2. Please obtain BMP/CBC in one week  Home Health: yes Equipment/Devices:walker Discharge Condition:stable CODE STATUS:full Diet recommendation: heart healthy  Brief/Interim Summary: 83 y.o.femalewith medical history significant of HTN, HLD, PVD s/p iliac stents, CAD, hypothyroidism, osteoarthritis,DDD, s/p laminectomy; who presents after having a fall at home 2 days ago. Imaging studies of the right hip and pelvis revealed multiple pelvic fractures. Dr. Victorino December of orthopedics was consulted, butrecommended admission to [facility] with nonweightbearing until evaluation by physical therapy.  # Multiple pelvic fractures ([place]) secondary due to fall.  -Evaluated by orthopedics. I discussed with Ainsley Spinner PA-C who saw patient with Dr. Marcelino Scot recommended that patient will be managed nonoperatively and can discharge home with home care services today. Recommended outpatient follow-up. Patient was evaluated by PT, OT. Patient clearly declined SNF or rehabilitation center on discharge. She said she has very wonderful neighbors and were very supportive. She verbalized understanding. Her pain is controlled. She needs to work with physical therapist and a question for at least. Prescription for Lovenox provided for 14 days as per orthopedics recommendation. Recommended to monitor for any sign of bleeding or bruises. -TSH elevated with normal free t3 and free T4. Needs PCP follow ups.   # Essential hypertension, benign: Blood pressure acceptable. Continue amlodipine and metoprolol. Patient has  borderline bradycardia at night. Continue to  monitor heart rate.  # COPD (chronic obstructive pulmonary disease) ([place]): Stable     #Thrombocytopenia ([place]), chronic: No sign of bleeding. Continue to monitor. Platelet count is stable.    # TOBACCO USE: Education provided to the patient.   Patient was pelvic fracture seen by orthopedics. I discussed with the orthopedic team today. Also recommended nonoperative management, rehabilitation and Lovenox subcutaneous and discharge. Patient is very eager to go home. Home care services ordered and case manager was consulted to arrange for medications and discharge planning.. As per orthopedic team, weight-bearing as tolerated.  Discharge Diagnoses:  Principal Problem:   Multiple pelvic fractures ([place]) Active Problems:   Essential hypertension, benign   COPD (chronic obstructive pulmonary disease) ([facility])   Thrombocytopenia ([facility])   TOBACCO USE   Fall   Multiple closed pelvic fractures without disruption of pelvic circle [facility])    Discharge Instructions  Discharge Instructions    Call MD for:  difficulty breathing, headache or visual disturbances    Complete by:  As directed    Call MD for:  extreme fatigue    Complete by:  As directed    Call MD for:  hives    Complete by:  As directed    Call MD for:  persistant dizziness or light-headedness    Complete by:  As directed    Call MD for:  persistant nausea and vomiting    Complete by:  As directed    Call MD for:  severe uncontrolled pain    Complete by:  As directed    Call MD for:  temperature >100.4    Complete by:  As directed    Diet - low sodium heart healthy    Complete by:  As directed    Discharge instructions    Complete by:  As directed    Please  follow up with PCP and Orthopedics in 1-2 weeks. Please check thyroid function test with your PCP in 4-6 weeks.   Increase activity slowly    Complete by:  As directed    Touch down weight bearing    Complete by:  As directed    Laterality:  right   Extremity:  Lower    Weight bearing as tolerated    Complete by:  As directed    Laterality:  left   Extremity:  Lower     Allergies as of 04/05/2016      Reactions   Statins Other (See Comments)   Elevated liver enzymes   Oxycodone Anxiety   Also states, "It makes me feel like a zombie"   Albuterol    Shakes    Hydrocodone-acetaminophen Nausea Only   Dizziness   Tequin Other (See Comments)   Patient doesn't recall      Medication List    STOP taking these medications   levofloxacin 500 MG tablet Commonly known as:  LEVAQUIN   nitroGLYCERIN 0.4 MG SL tablet Commonly known as:  NITROSTAT   predniSONE 10 MG tablet Commonly known as:  DELTASONE     TAKE these medications   acetaminophen 500 MG tablet Commonly known as:  TYLENOL Take 1 tablet (500 mg total) by mouth every 6 (six) hours as needed.   amLODipine 10 MG tablet Commonly known as:  NORVASC Take 1 tablet (10 mg total) by mouth daily.   aspirin 81 MG tablet Take 81 mg by mouth daily.   carboxymethylcellulose 0.5 % Soln Commonly known as:  REFRESH PLUS Place 1 drop into both eyes 4 (four) times daily.   enoxaparin 30 MG/0.3ML injection Commonly known as:  LOVENOX Inject 0.3 mLs (30 mg total) into the skin daily.   fish oil-omega-3 fatty acids 1000 MG capsule Take 1 g by mouth daily.   isosorbide mononitrate 30 MG 24 hr tablet Commonly known as:  IMDUR Take 1 tablet (30 mg total) by mouth daily.   levalbuterol 0.63 MG/3ML nebulizer solution Commonly known as:  XOPENEX Take 0.63 mg by nebulization every 4 (four) hours as needed for wheezing or shortness of breath.   levalbuterol 45 MCG/ACT inhaler Commonly known as:  XOPENEX HFA INHALE 2 PUFFS BY MOUTH EVERY 6 HOURS AS NEEDED FOR WHEEZING   metoprolol succinate 25 MG 24 hr tablet Commonly known as:  TOPROL-XL Take 1 tablet (25 mg total) by mouth daily.   SPIRIVA HANDIHALER 18 MCG inhalation capsule Generic drug:  tiotropium INHALE THE CONTENTS OF 1 CAPSULE VIA  INHALATION DEVICE EVERY DAY   traMADol 50 MG tablet Commonly known as:  ULTRAM TAKE ONE TABLET BY MOUTH EVERY EIGHT HOURS AS NEEDED FOR PAIN   Vitamin D 1000 units capsule Take 1,000 Units by mouth daily.            Durable Medical Equipment        Start     Ordered   04/02/16 1220  For home use only DME 3 n 1  Once     04/02/16 1220     Follow-up Information    HANDY,MICHAEL H, MD. Schedule an appointment as soon as possible for a visit in 2 week(s).   Specialty:  Orthopedic Surgery Why:  follow up for pelvis fractures  Contact information: [place] [place] [address] [phone]        Lottie Dawson, MD. Schedule an appointment as soon as possible for a visit  in 1 week(s).   Specialties:  Internal Medicine, Pediatrics Contact information: [address] [place] [address] [phone]          Allergies  Allergen Reactions  . Statins Other (See Comments)    Elevated liver enzymes  . Oxycodone Anxiety    Also states, "It makes me feel like a zombie"  . Albuterol     Shakes   . Hydrocodone-Acetaminophen Nausea Only    Dizziness  . Tequin Other (See Comments)    Patient doesn't recall    Consultations: Orthopedics  Procedures/Studies: None  Subjective: Patient was seen and examined at bedside. Patient reported feeling good. Has minimal pain in her pelvis with movement. Working with physical therapist. Lorelee Cover to go home. Denied nausea vomiting chest pain or shortness of breath.  Discharge Exam: Vitals:   04/05/16 0453 04/05/16 0926  BP: 137/70 (!) 118/44  Pulse: (!) 58 65  Resp: 16 17  Temp: 98.4 F (36.9 C)    Vitals:   04/04/16 2031 04/05/16 0453 04/05/16 0926 04/05/16 0941  BP: (!) 138/54 137/70 (!) 118/44   Pulse: (!) 55 (!) 58 65   Resp: 18 16 17    Temp: 98.3 F (36.8 C) 98.4 F (36.9 C)    TempSrc: Oral Oral    SpO2: 98% 96%  97%  Weight:        General: Pleasant elderly female  sitting on bed comfortable, not in distress Cardiovascular: RRR, S1/S2 +, no rubs, no gallops Respiratory: CTA bilaterally, no wheezing, no rhonchi Abdominal: Soft, NT, ND, bowel sounds + Extremities: no edema, no cyanosis Neurologic: Alert, awake, oriented 3. Patient has mental capacity to make clinical assessment.   The results of significant diagnostics from this hospitalization (including imaging, microbiology, ancillary and laboratory) are listed below for reference.     Microbiology: No results found for this or any previous visit (from the past 240 hour(s)).   Labs: BNP (last 3 results)  Recent Labs  11/12/15 1205  BNP [id number]*   Basic Metabolic Panel:  Recent Labs Lab 04/01/16 1548 04/02/16 0103 04/03/16 0539  NA 140 138 140  K 3.5 3.0* 3.6  CL 107 104 105  CO2 26 23 27   GLUCOSE 97 150* 88  BUN 17 17 25*  CREATININE 0.96 1.05* 1.11*  CALCIUM 9.0 8.9 8.7*   Liver Function Tests: No results for input(s): AST, ALT, ALKPHOS, BILITOT, PROT, ALBUMIN in the last 168 hours. No results for input(s): LIPASE, AMYLASE in the last 168 hours. No results for input(s): AMMONIA in the last 168 hours. CBC:  Recent Labs Lab 04/01/16 1548 04/02/16 0103 04/04/16 0431  WBC 7.9 6.8 6.2  NEUTROABS 5.6  --   --   HGB 12.2 11.4* 12.4  HCT 38.0 36.3 39.3  MCV 92.0 92.6 95.4  PLT 126* 124* 124*   Cardiac Enzymes: No results for input(s): CKTOTAL, CKMB, CKMBINDEX, TROPONINI in the last 168 hours. BNP: Invalid input(s): POCBNP CBG: No results for input(s): GLUCAP in the last 168 hours. D-Dimer No results for input(s): DDIMER in the last 72 hours. Hgb A1c No results for input(s): HGBA1C in the last 72 hours. Lipid Profile No results for input(s): CHOL, HDL, LDLCALC, TRIG, CHOLHDL, LDLDIRECT in the last 72 hours. Thyroid function studies No results for input(s): TSH, T4TOTAL, T3FREE, THYROIDAB in the last 72 hours.  Invalid input(s): FREET3 Anemia work up No results  for input(s): VITAMINB12, FOLATE, FERRITIN, TIBC, IRON, RETICCTPCT in the last 72 hours. Urinalysis  Component Value Date/Time   COLORURINE YELLOW 09/21/2010 0236   APPEARANCEUR CLEAR 09/21/2010 0236   LABSPEC 1.016 09/21/2010 0236   PHURINE 5.5 09/21/2010 0236   GLUCOSEU NEGATIVE 09/21/2010 0236   HGBUR MODERATE (A) 09/21/2010 0236   HGBUR large 02/27/2010 0951   BILIRUBINUR 1+ 08/12/2014 0937   KETONESUR negative 08/15/2012 1103   KETONESUR NEGATIVE 09/21/2010 0236   PROTEINUR Trace 08/12/2014 0937   PROTEINUR NEGATIVE 09/21/2010 0236   UROBILINOGEN 0.2 08/12/2014 0937   UROBILINOGEN 0.2 09/21/2010 0236   NITRITE Negative 08/12/2014 0937   NITRITE NEGATIVE 09/21/2010 0236   LEUKOCYTESUR Negative 08/12/2014 0937   Sepsis Labs Invalid input(s): PROCALCITONIN,  WBC,  LACTICIDVEN Microbiology No results found for this or any previous visit (from the past 240 hour(s)).   Time coordinating discharge: 28 minutes  SIGNED:   Rosita Fire, MD  [facility] 04/05/2016, 1:48 PM  If 7PM-7AM, please contact night-coverage [email or web address] Password [email or web address]

## 2016-04-05 NOTE — Progress Notes (Signed)
Pt discharged to home.  Discharge instructions explained to pt.  Pt has no questions at the time of discharge.  IV removed.  Pt states she has all belongings.  Pt taken off unit via wheelchair by staff.

## 2016-04-05 NOTE — Care Management Note (Addendum)
Case Management Note  Patient Details  Name: [name] [name] MRN: [id number] Date of Birth: [birth year]  Subjective/Objective:                    Action/Plan: Patient has 3 in1 delivered to room already. Aware she needs Lovenox , she uses [facility] on [address] called same their system is down so they are unable to fill any prescriptions , it is unknown when system will be back up , instructed to call [facility] on [address] same done was told all [facility] system in [place] are down and no [facility] can fill prescription until system is back up which is unkown when. Pharmacy recommended patient use another pharmacy .  Placed benefits check awaiting results.   Called [facility] on [address] , they have medication in stock. Called prescription in and faxed insurance information to [employer] [facility] [place] at [phone]. Explained to patient NCM will call Lanelle Bal back once she has received fax and Lanelle Bal will run insurance and at that time she can tell us patient's co pay. Also , explained NCM has call into INS company for co pay. Patient voiced understanding , however, stated " I don't care what the co pay is , I'll get it".   Spoke with Lanelle Bal at [facility] [place] pay is $3.35 and medication is in stock. Patient aware and will go there for Lovenox  Expected Discharge Date:  04/05/16               Expected Discharge Plan:  [place]  In-House Referral:     Discharge planning Services  CM Consult  Post Acute Care Choice:  Home Health Choice offered to:  Patient  DME Arranged:  3-N-1 DME Agency:  [place]:  Nurse's Aide, OT, PT, RN [facility] Agency:  [place]  Status of Service:  Completed, signed off  If discussed at [place] of Stay Meetings, dates discussed:    Additional Comments:  Marilu Favre, RN 04/05/2016, 2:48 PM

## 2016-04-05 NOTE — Evaluation (Signed)
Occupational Therapy Evaluation Patient Details Name: [name] [name] MRN: [id number] DOB: 1927-07-19 Today's Date: 04/05/2016    History of Present Illness 83 y.o. female with medical history significant of HTN, HLD, PVD s/p iliac stents, CAD, hypothyroidism, osteoarthritis,DDD, s/p laminectomy; who presents after having a fall at home 2 days ago.  X-ray revealed multiple pelvic fractures.  MD felt no surgery warranted and wanted to see how pt did with PT with mobility.  PMH includes HTN, COPD, Smoker.   Clinical Impression   Patient evaluated by Occupational Therapy with no further acute OT needs identified. All education has been completed and the patient has no further questions. Pt is able to perform ADLs at supervision/set up - min guard assist.  She was provided with reacher.  She is pending discharge home with assist from friends.  Recommend HHOT at discharge.  No further acute needs.  See below for any follow-up Occupational Therapy or equipment needs. OT is signing off. Thank you for this referral.      Follow Up Recommendations  Home health OT;Supervision - Intermittent    Equipment Recommendations  None recommended by OT    Recommendations for Other Services       Precautions / Restrictions Precautions Precautions: Fall Restrictions Weight Bearing Restrictions: Yes RLE Weight Bearing: Touchdown weight bearing LLE Weight Bearing: Weight bearing as tolerated      Mobility Bed Mobility Overal bed mobility: Needs Assistance Bed Mobility: Supine to Sit;Sit to Supine     Supine to sit: HOB elevated;Min assist Sit to supine: Supervision   General bed mobility comments: Pt requires increased time.  She reports she plans to sleep in recliner   Transfers Overall transfer level: Needs assistance Equipment used: Rolling walker (2 wheeled) Transfers: Sit to/from Stand Sit to Stand: Supervision         General transfer comment: Cues needed. Pt takes incr time  due to pain.       Balance Overall balance assessment: Needs assistance;History of Falls Sitting-balance support: No upper extremity supported;Feet supported Sitting balance-Leahy Scale: Good     Standing balance support: Bilateral upper extremity supported;During functional activity Standing balance-Leahy Scale: Poor Standing balance comment: relies on UEs for support due to pain in pelvis and right hip                            ADL Overall ADL's : Needs assistance/impaired Eating/Feeding: Independent   Grooming: Wash/dry hands;Wash/dry face;Oral care;Applying deodorant;Brushing hair;Set up;Sitting   Upper Body Bathing: Set up;Sitting   Lower Body Bathing: Sitting/lateral leans;Sit to/from stand;Min guard   Upper Body Dressing : Set up;Sitting   Lower Body Dressing: Minimal assistance;Sit to/from stand Lower Body Dressing Details (indicate cue type and reason): Pt requires Min A for slipper on Rt foot.  She was provided with reacher to assist with LB ADLs  Toilet Transfer: Min guard;Ambulation;Comfort height toilet;Regular Toilet;Grab bars;RW   Toileting- Water quality scientist and Hygiene: Min guard;Sit to/from Nurse, children's Details (indicate cue type and reason): Pt able to verbalize safe technique - backing up to tub, sitting back on seat and swinging LEs into tub  Functional mobility during ADLs: Min guard;Rolling walker General ADL Comments: Pt very motivated.  discussed use of bag on walker.  Pt reports neighbors will assist her with meals and groceries      Vision         Perception     Praxis  Pertinent Vitals/Pain Pain Assessment: Faces Faces Pain Scale: Hurts even more Pain Location: R groin with movement; tends to use hands to assist R thigh when moving in bed Pain Descriptors / Indicators: Aching;Grimacing;Guarding Pain Intervention(s): Monitored during session;Limited activity within patient's tolerance     Hand  Dominance Right   Extremity/Trunk Assessment Upper Extremity Assessment Upper Extremity Assessment: Overall WFL for tasks assessed   Lower Extremity Assessment Lower Extremity Assessment: Defer to PT evaluation   Cervical / Trunk Assessment Cervical / Trunk Assessment: Normal   Communication Communication Communication: No difficulties   Cognition Arousal/Alertness: Awake/alert Behavior During Therapy: WFL for tasks assessed/performed Overall Cognitive Status: Within Functional Limits for tasks assessed                     General Comments       Exercises       Shoulder Instructions      Home Living Family/patient expects to be discharged to:: Private residence Living Arrangements: Alone Available Help at Discharge: Neighbor;Available PRN/intermittently Type of Home: House Home Access: Stairs to enter [employer] of Steps: 1 Entrance Stairs-Rails: None Home Layout: One level     Bathroom Shower/Tub: Teacher, early years/pre: Standard     Home Equipment: Environmental consultant - 2 wheels;Toilet riser;Shower seat;Cane - single point          Prior Functioning/Environment Level of Independence: Independent        Comments: Drives        OT Problem List: Decreased strength;Decreased activity tolerance;Impaired balance (sitting and/or standing);Pain      OT Treatment/Interventions:      OT Goals(Current goals can be found in the care plan section) Acute Rehab OT Goals Patient Stated Goal: to go home OT Goal Formulation: All assessment and education complete, DC therapy  OT Frequency:     Barriers to D/C:            Co-evaluation              End of Session Equipment Utilized During Treatment: Rolling walker Nurse Communication: Mobility status  Activity Tolerance: Patient tolerated treatment well Patient left: in bed;with call bell/phone within reach  OT Visit Diagnosis: Pain Pain - Right/Left: Right Pain - part of body:  Hip                ADL either performed or assessed with clinical judgement  Time: [id number] OT Time Calculation (min): 22 min Charges:  OT General Charges $OT Visit: 1 Procedure OT Evaluation $OT Eval Low Complexity: 1 Procedure G-Codes:     Lucille Passy, OTR/L [id number]   Lucille Passy M 04/05/2016, 4:36 PM

## 2016-04-05 NOTE — Consult Note (Signed)
Orthopaedic Trauma Service (OTS) Consult   Reason for Consult: R LC 2 pelvic ring fracture  Referring Physician: Eli Hose, MD (Ortho)   HPI: [name] [name] is an 83 y.o. white female with complex medical history who sustained a ground-level approximately 3 days prior to admission. Patient was at her neighbor's house when she lost her balance on a concrete slab and fell on her right hip. Patient did have pain initially but then did not think much of it. Over the next several days she had increasing difficulty mobilizing. As such she presented to the hospital emergency room department 04/01/2016. These have right-sided pelvic ring fracture. Patient was admitted to the medical service due to her medical comorbidities with orthopedic consult. Case was discussed with Dr. Stann Mainland on the day of her presentation. Felt that making the patient touchdown weightbearing on her right leg and weightbearing as tolerated and allowing her to work with therapy to see how she would progress to be most prudent thing to do given that it was a low energy mechanism even though it is technically an LC 2 pattern. Patient has worked with therapy over the weekend and has steadily progressed. She is also quite adamant about not having surgery. She does live by herself but does state that she has a lot of family and friends support nearby. She is checked regularly by family and friends  She states that she does not use a cane or a walker but does have the device is home from previous surgeries including back surgery several years ago, laminectomy by Dr.Cohen.   Specifically asked the patient as she loses her balance on a regular basis and she denies this.  Patient denies any additional pain elsewhere.  She did sustain a tear to her lateral right ankle which is healing well  Past Medical History:  Diagnosis Date  . CARCINOMA, BASAL CELL 02/13/2006  . CAROTID ARTERY DISEASE 10/03/2006   Carotid US 11/17: Stable 40-59%  bilateral ICA stenosis >> f/u 1 year  . CHRONIC OBSTRUCTIVE PULMONARY DISEASE, ACUTE EXACERBATION 11/17/2009  . Chronic rhinitis 12/13/2006  . CORONARY ARTERY DISEASE 02/13/2006  . DEGENERATIVE JOINT DISEASE 09/27/2006  . DERMATITIS 09/25/2007  . Head trauma    closed  . HYPERLIPIDEMIA 03/10/2008  . HYPERTENSION 02/13/2006  . HYPOTHYROIDISM 01/30/2007  . LIVER FUNCTION TESTS, ABNORMAL 01/22/2009  . LOSS, HEARING NOS 08/25/2006  . Near syncope 09/18/2014  . NEOPLASM, SKIN, UNCERTAIN BEHAVIOR [id number]  . NEPHROLITHIASIS, HX OF 02/27/2010  . OSTEOARTHRITIS, HAND 12/16/2008  . PERIPHERAL VASCULAR DISEASE 02/13/2006  . Personal history of malignant neoplasm of breast 07/24/2008  . Personal history of urinary calculi   . Post-traumatic wound infection 12/01/2010   Mild streaking superiorly and on one day of septra  Will give rocephin and add keflex  adn follow up   . POSTHERPETIC NEURALGIA 08/25/2006  . RENAL ARTERY STENOSIS 08/01/2008  . Shingles    recurrent  . SPINAL STENOSIS 11/01/2006  . THROMBOCYTOPENIA 07/30/2008  . UNS ADVRS EFF UNS RX MEDICINAL&BIOLOGICAL SBSTNC 01/30/2007  . UNSPECIFIED ANEMIA 03/18/2008  . Unspecified vitamin D deficiency 02/07/2007  . UNSTABLE ANGINA 12/12/2009  . Vitreous detachment     Past Surgical History:  Procedure Laterality Date  . ABDOMINAL HYSTERECTOMY    . BREAST LUMPECTOMY    . BREAST LUMPECTOMY  1998  . CATARACT EXTRACTION    . CORONARY ANGIOPLASTY WITH STENT PLACEMENT     bilat iliac stents, left renal artery and rt coronary artery last myoview 8/09  with EF 70%  . LUMBAR LAMINECTOMY    . SKIN CANCER EXCISION    . TONSILLECTOMY      Family History  Problem Relation Age of Onset  . Breast cancer Daughter   . Tuberculosis Father     Social History:  reports that she quit smoking about 5 years ago. Her smoking use included Cigarettes. She has a 30.00 pack-year smoking history. She has never used smokeless tobacco. She reports that she does not  drink alcohol or use drugs.  Allergies:  Allergies  Allergen Reactions  . Statins Other (See Comments)    Elevated liver enzymes  . Oxycodone Anxiety    Also states, "It makes me feel like a zombie"  . Albuterol     Shakes   . Hydrocodone-Acetaminophen Nausea Only    Dizziness  . Tequin Other (See Comments)    Patient doesn't recall    Medications: I have reviewed the patient's current medications.  Results for orders placed or performed during the hospital encounter of 04/01/16 (from the past 48 hour(s))  CBC     Status: Abnormal   Collection Time: 04/04/16  4:31 AM  Result Value Ref Range   WBC 6.2 4.0 - 10.5 K/uL   RBC 4.12 3.87 - 5.11 MIL/uL   Hemoglobin 12.4 12.0 - 15.0 g/dL   HCT 39.3 36.0 - 46.0 %   MCV 95.4 78.0 - 100.0 fL   MCH 30.1 26.0 - 34.0 pg   MCHC 31.6 30.0 - 36.0 g/dL   RDW 14.8 11.5 - 15.5 %   Platelets 124 (L) 150 - 400 K/uL     Review of Systems  Constitutional: Negative for chills and fever.  Respiratory: Negative for shortness of breath and wheezing.   Cardiovascular: Negative for chest pain and palpitations.  Gastrointestinal: Negative for nausea and vomiting.  Musculoskeletal:       Low back and R groin pain    Blood pressure (!) 118/44, pulse 65, temperature 98.4 F (36.9 C), temperature source Oral, resp. rate 17, weight 47.6 kg (104 lb 14.4 oz), SpO2 97 %. Physical Exam  Constitutional: She is cooperative.  A very pleasant but frail appearing white female Comfortable, no acute distress  Cardiovascular: Normal rate and regular rhythm.   Pulmonary/Chest:  Unlabored  Musculoskeletal:  Pelvis and bilateral lower extremities Inspection:   No gross deformities appreciated   Clinical appearance of her extremities appeared to be appropriate   Skin tear to the lateral right ankle is healing well. No signs of infection Bony eval:   Right ankle is nontender   Right knee is nontender   Mild right groin pain   No increases in pain with axial  loading or logrolling of her hip.   Patient does have some mild discomfort referred to her hip with active flexion of the hip   No significant pain with lateral compression of the pelvis      Left lower extremity is nontender    Soft tissue:    Very thin appearing, low muscle mass     No open wounds or lesions to R hip/pelvis    Skin tear lateral R ankle healing well        Surgical scar Left lower leg well healed   ROM:   Excellent active ROM R knee and ankle   Able to perform active hip flexion but pain limits full motion       L leg ROM intact  Sensation:   DPN, SPN, TN sensation  grossly intact B  Motor:   EHL, FHL, AT, PT, peroneals, gastroc motor function intact B    + quad set B     + knee flexion B    + hip flexion B      Vascular:    + DP pulses B     No appreciable swelling B LEx  Neurological: She is alert.  Nursing note and vitals reviewed.  Imaging  CT and xrays of pelvis  R  LC 2 pelvic Ring fracture   Assessment/Plan:  83 y/o female s/p ground level fall with R LC 2 pelvic ring fracture   -right lateral compression type 2 pelvic ring fracture s/p ground level fall  Patient has been working with therapy  Although slowed is still progressing in a positive fashion   Feel that we can manage patient injury nonoperatively. Also films and CT scan indicate poor bone quality which would likely lead to suboptimal fixation.  Patient also feels strongly about managing this nonoperatively.   Suspect that given that this was a low energy mechanism she still has some stability present. She does appear to have significant arthritis in her lumbar spine and pelvis. This likely that her iliac wing was the weakest area which fractured.   Continue touchdown weightbearing on her right leg for another 8 weeks. Weight-bear as tolerated on the left leg  Patient must use a walker for balance and ambulation and this was reiterated to the patient.   As long as patient has a 24  hour supervision she can discharge home otherwise she will need a skilled nursing facility. Subsequent falls could lead to further exacerbation of her injury.  - Pain management:  Minimize narcotics. Continue with current regimen  Patient seems quite stoic  Patient does have chronic pain related to her back  - Medical issues   Per primary team  - DVT/PE prophylaxis:  Currently on Lovenox  Would recommend 14 days of Lovenox at discharge. This would likely cover the patient while she is somewhat incapacitated. I would anticipate her being much more mobile in about 2 weeks.   - Metabolic Bone Disease:  Patient did have a normal bone density scan back in 2014. I do not see any more recent bone density scans. Her fracture pattern as well as the appearance for a bone CT scan would indicate that they would be within the osteopenic or osteoporotic range. Would recommend bone density scan as an outpatient  Check vitamin D level  - Activity:  Touchdown weightbearing right leg, weight-bear as tolerated left leg, walker use for ambulation and balance  - Dispo:  Continue with therapy  Patient will likely be ready for discharge tomorrow  Follow-up with orthopedics in 2 weeks   Jari Pigg, PA-C [facility] [phone] (P) 04/05/2016, 9:59 AM

## 2016-04-05 NOTE — Progress Notes (Signed)
Physical Therapy Treatment Patient Details Name: [name] [name] MRN: [id number] DOB: July 15, 1927 Today's Date: 04/05/2016    History of Present Illness 83 y.o. female with medical history significant of HTN, HLD, PVD s/p iliac stents, CAD, hypothyroidism, osteoarthritis,DDD, s/p laminectomy; who presents after having a fall at home 2 days ago.  X-ray revealed multiple pelvic fractures.  MD felt no surgery warranted and wanted to see how pt did with PT with mobility.  PMH includes HTN, COPD, Smoker.    PT Comments    Pt admitted with above diagnosis. Pt currently with functional limitations due to the deficits listed below (see PT Problem List). Pt was able to ambulate with RW with supervision.  Pt with good safety overall.  Pt hurting in groin and right hip today per pt and stated she has a small door jam to get into home and refused to practice.   Pt will benefit from skilled PT to increase their independence and safety with mobility to allow discharge to the venue listed below.     Follow Up Recommendations  Home health PT;Supervision - Intermittent (HHOT, Aide; liked working with Alvis Lemmings previously)     Equipment Recommendations  3in1 (PT)    Recommendations for Other Services OT consult     Precautions / Restrictions Precautions Precautions: Fall Restrictions Weight Bearing Restrictions: Yes RLE Weight Bearing: Touchdown weight bearing LLE Weight Bearing: Weight bearing as tolerated    Mobility  Bed Mobility Overal bed mobility: Needs Assistance Bed Mobility: Supine to Sit;Sit to Supine     Supine to sit: HOB elevated;Min assist Sit to supine: Min assist   General bed mobility comments: Pt was able to get into  and OOB  with min assist.  Does not get in bed at home.  Sleeps in recliner.   Transfers Overall transfer level: Needs assistance Equipment used: Rolling walker (2 wheeled) Transfers: Sit to/from Stand Sit to Stand: Supervision         General  transfer comment: Cues needed. Pt takes incr time due to pain.     Ambulation/Gait Ambulation/Gait assistance: Supervision Ambulation Distance (Feet): 45 Feet Assistive device: Rolling walker (2 wheeled) Gait Pattern/deviations: Step-to pattern;Decreased step length - right;Decreased step length - left;Decreased stance time - right;Decreased weight shift to right;Decreased stride length;Antalgic   Gait velocity interpretation: Below normal speed for age/gender General Gait Details: Pt taking small steps due to pain.  Cued initially to not step too far into RW.  Pt was able to demonstrate appropriate technique with RW as she progressed her ambulation.  Pt very cautious.  Took incr time to ambulate 45 feet. Pt keeps weight off right hip performing TDWB.    Stairs            Wheelchair Mobility    Modified Rankin (Stroke Patients Only)       Balance Overall balance assessment: Needs assistance;History of Falls Sitting-balance support: No upper extremity supported;Feet supported Sitting balance-Leahy Scale: Fair     Standing balance support: Bilateral upper extremity supported;During functional activity Standing balance-Leahy Scale: Poor Standing balance comment: relies on UEs for support due to pain in pelvis and right hip                    Cognition Arousal/Alertness: Awake/alert Behavior During Therapy: WFL for tasks assessed/performed Overall Cognitive Status: Within Functional Limits for tasks assessed                      Exercises  General Comments General comments (skin integrity, edema, etc.): Pt on RA with sats >90% throughout.      Pertinent Vitals/Pain Pain Assessment: Faces Faces Pain Scale: Hurts whole lot Pain Location: R groin with movement; tends to use hands to assist R thigh when moving in bed Pain Descriptors / Indicators: Aching;Grimacing;Guarding Pain Intervention(s): Limited activity within patient's tolerance;Monitored  during session;Repositioned;Patient requesting pain meds-RN notified  VSS  Home Living                      Prior Function            PT Goals (current goals can now be found in the care plan section) Progress towards PT goals: Progressing toward goals    Frequency    Min 5X/week      PT Plan Current plan remains appropriate    Co-evaluation             End of Session Equipment Utilized During Treatment: Gait belt Activity Tolerance: Patient limited by fatigue Patient left: with call bell/phone within reach;in bed Nurse Communication: Mobility status;Patient requests pain meds PT Visit Diagnosis: Unsteadiness on feet (R26.81);Pain Pain - Right/Left: Right Pain - part of body:  (groin)     Time: [id number] PT Time Calculation (min) (ACUTE ONLY): 23 min  Charges:  $Gait Training: 23-37 mins                    G Codes:  Functional Assessment Tool Used: AM-PAC 6 Clicks Basic Mobility Functional Limitation: Mobility: Walking and moving around Mobility: Walking and Moving Around Current Status [id number]): At least 1 percent but less than 20 percent impaired, limited or restricted Mobility: Walking and Moving Around Goal Status [phone]): At least 1 percent but less than 20 percent impaired, limited or restricted    Denice Paradise 04/05/2016, 1:09 PM Jermeka Schlotterbeck,PT Acute Rehabilitation [phone] (pager)

## 2016-04-05 NOTE — Care Management Note (Signed)
Case Management Note  Patient Details  Name: [name] [name] MRN: [id number] Date of Birth: September 10, 1927  Subjective/Objective:                    Action/Plan:   Expected Discharge Date:                  Expected Discharge Plan:  [place]  In-House Referral:     Discharge planning Services  CM Consult  Post Acute Care Choice:  Home Health Choice offered to:  Patient  DME Arranged:  3-N-1 DME Agency:  [place]:  Nurse's Aide, OT, PT, RN [facility] Agency:  [place]  Status of Service:  Completed, signed off  If discussed at [place] of Stay Meetings, dates discussed:    Additional Comments:  Marilu Favre, RN 04/05/2016, 11:35 AM

## 2016-04-06 DIAGNOSIS — R262 Difficulty in walking, not elsewhere classified: Secondary | ICD-10-CM | POA: Diagnosis not present

## 2016-04-06 DIAGNOSIS — S3282XD Multiple fractures of pelvis without disruption of pelvic ring, subsequent encounter for fracture with routine healing: Secondary | ICD-10-CM | POA: Diagnosis not present

## 2016-04-06 DIAGNOSIS — Z5181 Encounter for therapeutic drug level monitoring: Secondary | ICD-10-CM | POA: Diagnosis not present

## 2016-04-06 LAB — VITAMIN D 25 HYDROXY (VIT D DEFICIENCY, FRACTURES): Vit D, 25-Hydroxy: 33.8 ng/mL (ref 30.0–100.0)

## 2016-04-07 ENCOUNTER — Telehealth: Payer: Self-pay | Admitting: Internal Medicine

## 2016-04-07 DIAGNOSIS — S3282XD Multiple fractures of pelvis without disruption of pelvic ring, subsequent encounter for fracture with routine healing: Secondary | ICD-10-CM | POA: Diagnosis not present

## 2016-04-07 DIAGNOSIS — Z5181 Encounter for therapeutic drug level monitoring: Secondary | ICD-10-CM | POA: Diagnosis not present

## 2016-04-07 DIAGNOSIS — R262 Difficulty in walking, not elsewhere classified: Secondary | ICD-10-CM | POA: Diagnosis not present

## 2016-04-07 NOTE — Telephone Encounter (Signed)
Verbal orders to continue PT for 2 x 3 weeks 1 x 3 weeks effective today

## 2016-04-07 NOTE — Telephone Encounter (Signed)
Please advise 

## 2016-04-08 ENCOUNTER — Telehealth: Payer: Self-pay | Admitting: Emergency Medicine

## 2016-04-08 ENCOUNTER — Telehealth: Payer: Self-pay | Admitting: Internal Medicine

## 2016-04-08 DIAGNOSIS — R262 Difficulty in walking, not elsewhere classified: Secondary | ICD-10-CM | POA: Diagnosis not present

## 2016-04-08 DIAGNOSIS — Z5181 Encounter for therapeutic drug level monitoring: Secondary | ICD-10-CM | POA: Diagnosis not present

## 2016-04-08 DIAGNOSIS — S3282XD Multiple fractures of pelvis without disruption of pelvic ring, subsequent encounter for fracture with routine healing: Secondary | ICD-10-CM | POA: Diagnosis not present

## 2016-04-08 NOTE — Telephone Encounter (Signed)
Pt is scheduled for a face to face

## 2016-04-08 NOTE — Telephone Encounter (Signed)
Ok to do this but  I  Cannot do  A form or assessment  Except as recommended by  PT    I cannot provide any other information  Since 12 17   Wt Readings from Last 3 Encounters:  04/02/16 104 lb 14.4 oz (47.6 kg)  03/04/16 98 lb 9.6 oz (44.7 kg)  01/27/16 102 lb (46.3 kg)     I have not seen her Face to face for this problem  seh was dced 3 5  Make sure she has OV with me  30 minutes   Before  April 5th

## 2016-04-08 NOTE — Telephone Encounter (Signed)
° ° ° °  Don with [facility] home health call to ask an order for a wheel chair pt is unable to walk without assistance. He said this is a urgent request and script for wheel chair  can be sent to [facility]   He said they will need face sheet with weight   OT PLAN OF CARE   2 TIMES A WEEK FOR 2 WEEKS  1 TIME A WEEK FOR 2 WEEK FOR abl,transfer exercise Iadl and wheel chair mobility    Face sheet   [phone]

## 2016-04-08 NOTE — Telephone Encounter (Signed)
Called pt to set up an appointment before April 5.... No answer/busy

## 2016-04-08 NOTE — Telephone Encounter (Signed)
Please advise 

## 2016-04-08 NOTE — Telephone Encounter (Signed)
Called Don at [facility] and left a voicemail with Dr. Regis Bill recommendations to go ahead with the PT but not the other orders until pt has been seen in the office.

## 2016-04-09 DIAGNOSIS — Z5181 Encounter for therapeutic drug level monitoring: Secondary | ICD-10-CM | POA: Diagnosis not present

## 2016-04-09 DIAGNOSIS — R262 Difficulty in walking, not elsewhere classified: Secondary | ICD-10-CM | POA: Diagnosis not present

## 2016-04-09 DIAGNOSIS — S3282XD Multiple fractures of pelvis without disruption of pelvic ring, subsequent encounter for fracture with routine healing: Secondary | ICD-10-CM | POA: Diagnosis not present

## 2016-04-09 NOTE — Telephone Encounter (Signed)
Ok to continue PT  Until we see her

## 2016-04-10 DIAGNOSIS — R69 Illness, unspecified: Secondary | ICD-10-CM | POA: Diagnosis not present

## 2016-04-12 ENCOUNTER — Ambulatory Visit: Payer: Medicare HMO | Admitting: Internal Medicine

## 2016-04-13 DIAGNOSIS — S3282XD Multiple fractures of pelvis without disruption of pelvic ring, subsequent encounter for fracture with routine healing: Secondary | ICD-10-CM | POA: Diagnosis not present

## 2016-04-13 DIAGNOSIS — R262 Difficulty in walking, not elsewhere classified: Secondary | ICD-10-CM | POA: Diagnosis not present

## 2016-04-13 DIAGNOSIS — Z5181 Encounter for therapeutic drug level monitoring: Secondary | ICD-10-CM | POA: Diagnosis not present

## 2016-04-14 DIAGNOSIS — S3282XD Multiple fractures of pelvis without disruption of pelvic ring, subsequent encounter for fracture with routine healing: Secondary | ICD-10-CM | POA: Diagnosis not present

## 2016-04-14 DIAGNOSIS — Z5181 Encounter for therapeutic drug level monitoring: Secondary | ICD-10-CM | POA: Diagnosis not present

## 2016-04-14 DIAGNOSIS — R262 Difficulty in walking, not elsewhere classified: Secondary | ICD-10-CM | POA: Diagnosis not present

## 2016-04-15 DIAGNOSIS — Z5181 Encounter for therapeutic drug level monitoring: Secondary | ICD-10-CM | POA: Diagnosis not present

## 2016-04-15 DIAGNOSIS — R262 Difficulty in walking, not elsewhere classified: Secondary | ICD-10-CM | POA: Diagnosis not present

## 2016-04-15 DIAGNOSIS — S3282XD Multiple fractures of pelvis without disruption of pelvic ring, subsequent encounter for fracture with routine healing: Secondary | ICD-10-CM | POA: Diagnosis not present

## 2016-04-16 DIAGNOSIS — R262 Difficulty in walking, not elsewhere classified: Secondary | ICD-10-CM | POA: Diagnosis not present

## 2016-04-16 DIAGNOSIS — Z5181 Encounter for therapeutic drug level monitoring: Secondary | ICD-10-CM | POA: Diagnosis not present

## 2016-04-16 DIAGNOSIS — S3282XD Multiple fractures of pelvis without disruption of pelvic ring, subsequent encounter for fracture with routine healing: Secondary | ICD-10-CM | POA: Diagnosis not present

## 2016-04-19 DIAGNOSIS — S3282XD Multiple fractures of pelvis without disruption of pelvic ring, subsequent encounter for fracture with routine healing: Secondary | ICD-10-CM | POA: Diagnosis not present

## 2016-04-19 DIAGNOSIS — R262 Difficulty in walking, not elsewhere classified: Secondary | ICD-10-CM | POA: Diagnosis not present

## 2016-04-19 DIAGNOSIS — Z5181 Encounter for therapeutic drug level monitoring: Secondary | ICD-10-CM | POA: Diagnosis not present

## 2016-04-20 ENCOUNTER — Encounter: Payer: Self-pay | Admitting: Internal Medicine

## 2016-04-20 ENCOUNTER — Ambulatory Visit (INDEPENDENT_AMBULATORY_CARE_PROVIDER_SITE_OTHER): Payer: Medicare HMO | Admitting: Internal Medicine

## 2016-04-20 VITALS — BP 110/70 | HR 70 | Temp 97.8°F | Ht 64.0 in | Wt 95.4 lb

## 2016-04-20 DIAGNOSIS — D696 Thrombocytopenia, unspecified: Secondary | ICD-10-CM | POA: Diagnosis not present

## 2016-04-20 DIAGNOSIS — R54 Age-related physical debility: Secondary | ICD-10-CM

## 2016-04-20 DIAGNOSIS — R262 Difficulty in walking, not elsewhere classified: Secondary | ICD-10-CM | POA: Diagnosis not present

## 2016-04-20 DIAGNOSIS — E46 Unspecified protein-calorie malnutrition: Secondary | ICD-10-CM | POA: Diagnosis not present

## 2016-04-20 DIAGNOSIS — Z9181 History of falling: Secondary | ICD-10-CM

## 2016-04-20 DIAGNOSIS — R7989 Other specified abnormal findings of blood chemistry: Secondary | ICD-10-CM

## 2016-04-20 DIAGNOSIS — S3282XD Multiple fractures of pelvis without disruption of pelvic ring, subsequent encounter for fracture with routine healing: Secondary | ICD-10-CM | POA: Diagnosis not present

## 2016-04-20 DIAGNOSIS — F17200 Nicotine dependence, unspecified, uncomplicated: Secondary | ICD-10-CM | POA: Diagnosis not present

## 2016-04-20 DIAGNOSIS — M8000XA Age-related osteoporosis with current pathological fracture, unspecified site, initial encounter for fracture: Secondary | ICD-10-CM

## 2016-04-20 DIAGNOSIS — Z09 Encounter for follow-up examination after completed treatment for conditions other than malignant neoplasm: Secondary | ICD-10-CM | POA: Diagnosis not present

## 2016-04-20 DIAGNOSIS — L89159 Pressure ulcer of sacral region, unspecified stage: Secondary | ICD-10-CM | POA: Diagnosis not present

## 2016-04-20 DIAGNOSIS — S3282XS Multiple fractures of pelvis without disruption of pelvic ring, sequela: Secondary | ICD-10-CM | POA: Diagnosis not present

## 2016-04-20 DIAGNOSIS — Z5181 Encounter for therapeutic drug level monitoring: Secondary | ICD-10-CM | POA: Diagnosis not present

## 2016-04-20 DIAGNOSIS — J449 Chronic obstructive pulmonary disease, unspecified: Secondary | ICD-10-CM | POA: Diagnosis not present

## 2016-04-20 DIAGNOSIS — R946 Abnormal results of thyroid function studies: Secondary | ICD-10-CM

## 2016-04-20 DIAGNOSIS — R69 Illness, unspecified: Secondary | ICD-10-CM | POA: Diagnosis not present

## 2016-04-20 LAB — BASIC METABOLIC PANEL
BUN: 29 mg/dL — AB (ref 6–23)
CALCIUM: 9.5 mg/dL (ref 8.4–10.5)
CHLORIDE: 109 meq/L (ref 96–112)
CO2: 29 meq/L (ref 19–32)
CREATININE: 0.8 mg/dL (ref 0.40–1.20)
GFR: 72.22 mL/min (ref >60.00)
GLUCOSE: 109 mg/dL — AB (ref 70–99)
Potassium: 4.7 mEq/L (ref 3.5–5.1)
Sodium: 144 mEq/L (ref 135–145)

## 2016-04-20 LAB — T3, FREE: T3 FREE: 3 pg/mL (ref 2.3–4.2)

## 2016-04-20 LAB — CBC WITH DIFFERENTIAL/PLATELET
BASOS PCT: 0.9 % (ref 0.0–3.0)
Basophils Absolute: 0.1 10*3/uL (ref 0.0–0.1)
EOS ABS: 0.1 10*3/uL (ref 0.0–0.7)
Eosinophils Relative: 1.8 % (ref 0.0–5.0)
HEMATOCRIT: 40 % (ref 36.0–46.0)
Hemoglobin: 13 g/dL (ref 12.0–15.0)
LYMPHS ABS: 0.9 10*3/uL (ref 0.7–4.0)
LYMPHS PCT: 11.6 % — AB (ref 12.0–46.0)
MCHC: 32.5 g/dL (ref 30.0–36.0)
MCV: 89.9 fl (ref 78.0–100.0)
MONO ABS: 0.7 10*3/uL (ref 0.1–1.0)
Monocytes Relative: 9 % (ref 3.0–12.0)
NEUTROS ABS: 5.7 10*3/uL (ref 1.4–7.7)
NEUTROS PCT: 76.7 % (ref 43.0–77.0)
PLATELETS: 251 10*3/uL (ref 150.0–400.0)
RBC: 4.44 Mil/uL (ref 3.87–5.11)
RDW: 15 % (ref 11.5–15.5)
WBC: 7.4 10*3/uL (ref 4.0–10.5)

## 2016-04-20 LAB — T4, FREE: FREE T4: 0.93 ng/dL (ref 0.60–1.60)

## 2016-04-20 LAB — HEPATIC FUNCTION PANEL
ALT: 13 U/L (ref 0–35)
AST: 22 U/L (ref 0–37)
Albumin: 3.7 g/dL (ref 3.5–5.2)
Alkaline Phosphatase: 170 U/L — ABNORMAL HIGH (ref 39–117)
BILIRUBIN TOTAL: 0.3 mg/dL (ref 0.2–1.2)
Bilirubin, Direct: 0.1 mg/dL (ref 0.0–0.3)
TOTAL PROTEIN: 5.9 g/dL — AB (ref 6.0–8.3)

## 2016-04-20 LAB — TSH: TSH: 0.9 u[IU]/mL (ref 0.35–4.50)

## 2016-04-20 LAB — VITAMIN D 25 HYDROXY (VIT D DEFICIENCY, FRACTURES): VITD: 49.83 ng/mL (ref 30.00–100.00)

## 2016-04-20 NOTE — Patient Instructions (Signed)
Will notify you  of labs when available.  Will do a referral  About the   Pressure sore.  Area   May implement rx for  Osteoporosis  Medication  Such as prolia.  Nutrition. ` is important in healing   Boost or other supplements may also help .   ROV in   1-2 months or as needed .

## 2016-04-20 NOTE — Progress Notes (Signed)
Chief Complaint  Patient presents with  . Follow-up    HPI: [name] [name] 83 y.o. come in for Chronic disease management   Post hospital  for falling and fracture of pelvis See report below. Her follow-up was delayed by a snow day. She apparently was going across the street and fell on the cement and had to pull pelvic fracture on the right. When hospitalized was also noted to have an abnormal TSH. COPD continuing to smoke. She was discharged home although she lives by herself is frail and underweight because she has many neighbors friends who are helping her get food and needs met. Home health has been coming out but finishing up. She asks about sore on her bottom that has been dressed a couple times. It does hurt to sit she's been sitting long periods of time. No fever new shortness of breath. Still continues to smoke. She is here with her good friend today. ROS: See pertinent positives and negatives per HPI. Apparently is on Xopenex nebulizer.  Past Medical History:  Diagnosis Date  . CARCINOMA, BASAL CELL 02/13/2006  . CAROTID ARTERY DISEASE 10/03/2006   Carotid US 11/17: Stable 40-59% bilateral ICA stenosis >> f/u 1 year  . CHRONIC OBSTRUCTIVE PULMONARY DISEASE, ACUTE EXACERBATION 11/17/2009  . Chronic rhinitis 12/13/2006  . CORONARY ARTERY DISEASE 02/13/2006  . DEGENERATIVE JOINT DISEASE 09/27/2006  . DERMATITIS 09/25/2007  . Head trauma    closed  . HYPERLIPIDEMIA 03/10/2008  . HYPERTENSION 02/13/2006  . HYPOTHYROIDISM 01/30/2007  . LIVER FUNCTION TESTS, ABNORMAL 01/22/2009  . LOSS, HEARING NOS 08/25/2006  . Near syncope 09/18/2014  . NEOPLASM, SKIN, UNCERTAIN BEHAVIOR 20/94/7096  . NEPHROLITHIASIS, HX OF 02/27/2010  . OSTEOARTHRITIS, HAND 12/16/2008  . PERIPHERAL VASCULAR DISEASE 02/13/2006  . Personal history of malignant neoplasm of breast 07/24/2008  . Personal history of urinary calculi   . Post-traumatic wound infection 12/01/2010   Mild streaking superiorly and  on one day of septra  Will give rocephin and add keflex  adn follow up   . POSTHERPETIC NEURALGIA 08/25/2006  . RENAL ARTERY STENOSIS 08/01/2008  . Shingles    recurrent  . SPINAL STENOSIS 11/01/2006  . THROMBOCYTOPENIA 07/30/2008  . UNS ADVRS EFF UNS RX MEDICINAL&BIOLOGICAL SBSTNC 01/30/2007  . UNSPECIFIED ANEMIA 03/18/2008  . Unspecified vitamin D deficiency 02/07/2007  . UNSTABLE ANGINA 12/12/2009  . Vitreous detachment     Family History  Problem Relation Age of Onset  . Breast cancer Daughter   . Tuberculosis Father     Social History   Social History  . Marital status: Widowed    Spouse name: N/A  . Number of children: N/A  . Years of education: 36   Occupational History  . retired    Social History Main Topics  . Smoking status: Former Smoker    Packs/day: 1.00    Years: 30.00    Types: Cigarettes    Quit date: 06/15/2010  . Smokeless tobacco: Never Used     Comment: 2-3 cigs per week/// 06/30/12  . Alcohol use No     Comment: socially  . Drug use: No  . Sexual activity: Not Asked   Other Topics Concern  . None   Social History Narrative   Widowed   5 hours of sleep   Has friends checking on her   No Pets      Patient drinks about 6 cups of caffeine daily.   Patient is right handed.    Outpatient Medications Prior to  Visit  Medication Sig Dispense Refill  . acetaminophen (TYLENOL) 500 MG tablet Take 1 tablet (500 mg total) by mouth every 6 (six) hours as needed. 30 tablet 0  . amLODipine (NORVASC) 10 MG tablet Take 1 tablet (10 mg total) by mouth daily. 90 tablet 1  . aspirin 81 MG tablet Take 81 mg by mouth daily.    . carboxymethylcellulose (REFRESH PLUS) 0.5 % SOLN Place 1 drop into both eyes 4 (four) times daily.    . Cholecalciferol (VITAMIN D) 1000 UNITS capsule Take 1,000 Units by mouth daily.      . fish oil-omega-3 fatty acids 1000 MG capsule Take 1 g by mouth daily.     . isosorbide mononitrate (IMDUR) 30 MG 24 hr tablet Take 1 tablet (30 mg  total) by mouth daily. 90 tablet 2  . levalbuterol (XOPENEX HFA) 45 MCG/ACT inhaler INHALE 2 PUFFS BY MOUTH EVERY 6 HOURS AS NEEDED FOR WHEEZING 45 g 4  . levalbuterol (XOPENEX) 0.63 MG/3ML nebulizer solution Take 0.63 mg by nebulization every 4 (four) hours as needed for wheezing or shortness of breath.    . metoprolol succinate (TOPROL-XL) 25 MG 24 hr tablet Take 1 tablet (25 mg total) by mouth daily. 90 tablet 2  . SPIRIVA HANDIHALER 18 MCG inhalation capsule INHALE THE CONTENTS OF 1 CAPSULE VIA INHALATION DEVICE EVERY DAY 30 capsule 5  . traMADol (ULTRAM) 50 MG tablet TAKE ONE TABLET BY MOUTH EVERY EIGHT HOURS AS NEEDED FOR PAIN 40 tablet 0  . enoxaparin (LOVENOX) 30 MG/0.3ML injection Inject 0.3 mLs (30 mg total) into the skin daily. 4.2 mL 0   No facility-administered medications prior to visit.      EXAM:  BP 110/70 (BP Location: Left Arm, Patient Position: Sitting, Cuff Size: Normal)   Pulse 70   Temp 97.8 F (36.6 C) (Oral)   Ht 5' 4"  (1.626 m)   Wt 95 lb 6.4 oz (43.3 kg)   SpO2 93%   BMI 16.38 kg/m   Body mass index is 16.38 kg/m.  GENERAL: vitals reviewed and listed above, alert, oriented, slender white female in the 1C in her wheelchair in no acute distress. Has occasional smoker's cough but no respiratory distress to normal color.  HEENT: atraumatic, conjunctiva  clear, no obvious abnormalities on inspection of external nose and ears NECK: no obvious masses on inspection palpation  LUNGS: No retractions decreased air movement no rales acute wheezes. CV: HRRR distant, no clubbing cyanosis or  peripheral edema nl cap refill  MS: moves all extremities sitting in a wheelchair no weightbearing on the right. Skin sacral area has kissing crusted area without weeping or discharge with some erythema. It is covered by a Telfa pad. No discharge on the pad. PSYCH: pleasant and cooperative, no obvious depression or anxiety Lab Results  Component Value Date   WBC 7.4 04/20/2016    HGB 13.0 04/20/2016   HCT 40.0 04/20/2016   PLT 251.0 04/20/2016   GLUCOSE 109 (H) 04/20/2016   CHOL 160 08/02/2014   TRIG 57.0 08/02/2014   HDL 64.70 08/02/2014   LDLCALC 84 08/02/2014   ALT 13 04/20/2016   AST 22 04/20/2016   NA 144 04/20/2016   K 4.7 04/20/2016   CL 109 04/20/2016   CREATININE 0.80 04/20/2016   BUN 29 (H) 04/20/2016   CO2 29 04/20/2016   TSH 0.90 04/20/2016   INR 1.04 09/20/2010   HGBA1C 5.5 11/14/2015   BP Readings from Last 3 Encounters:  04/20/16 110/70  04/05/16 (!) 124/52  03/04/16 108/62   Wt Readings from Last 3 Encounters:  04/20/16 95 lb 6.4 oz (43.3 kg)  04/02/16 104 lb 14.4 oz (47.6 kg)  03/04/16 98 lb 9.6 oz (44.7 kg)     ASSESSMENT AND PLAN:  Discussed the following assessment and plan:  Multiple closed fractures of pelvis without disruption of pelvic ring, sequela - Plan: Basic metabolic panel, CBC with Differential/Platelet, VITAMIN D 25 Hydroxy (Vit-D Deficiency, Fractures), Hepatic function panel, TSH, T4, free, T3, free, Ambulatory referral to Home Health  Protein-calorie malnutrition, unspecified severity ([place]) - Plan: VITAMIN D 25 Hydroxy (Vit-D Deficiency, Fractures), Hepatic function panel  Tobacco use disorder  Chronic obstructive pulmonary disease, unspecified COPD type ([place]) - Plan: Basic metabolic panel, CBC with Differential/Platelet, VITAMIN D 25 Hydroxy (Vit-D Deficiency, Fractures), Hepatic function panel  Thrombocytopenia ([facility]) - Plan: Basic metabolic panel, CBC with Differential/Platelet, VITAMIN D 25 Hydroxy (Vit-D Deficiency, Fractures), Hepatic function panel  Decubitus ulcer of sacral region, unspecified ulcer stage - Plan: Ambulatory referral to Home Health  Abnormal TSH - Elevated TSH with normal T4 when in hospital. - Plan: TSH, T4, free, T3, free  Hospital discharge follow-up  Hx of falling, presenting hazards to health  Osteoporosis with current pathological fracture, unspecified osteoporosis type,  initial encounter  Advanced age Reviewed plan still told to stop smoking which she doesn't plan on doing caution with care at home living alone fall prevention sacral breakdown area have home health referral to evaluate and help with care at home. Discussed history of fracture in the past did not want to get a bone density because she had broken a bone. Now I suggest she may be a candidate for osteoporosis therapy high risk Evaluate candidacy for prolia  I know the cost is a big issue with her medications.  Renal function today then plan follow-up visit in a couple months. I think the situation is very high risk living by herself even though she has helped because of her history of recurrent falls. I have she would've gone through rehabilitation for nutrition and tobacco cessation reasons -Patient advised to return or notify health care team  if  new concerns arise. Total visit 64mns > 50% spent counseling and coordinating care as indicated in above note and in instructions to patient .     Patient Instructions  Will notify you  of labs when available.  Will do a referral  About the   Pressure sore.  Area   May implement rx for  Osteoporosis  Medication  Such as prolia.  Nutrition. ` is important in healing   Boost or other supplements may also help .   ROV in   1-2 months or as needed .       WStandley Brooking Cross Jorge M.D.  Admit date: 04/01/2016 Discharge date: 04/05/2016  Admitted From:Home Disposition:Home with home care services (pt declined rehab or SNF on discharge)  Recommendations for Outpatient Follow-up:  1. Follow up with PCP and orthopedics in 1-2 weeks 2. Please obtain BMP/CBC in one week  Home Health: yes Equipment/Devices:walker Discharge Condition:stable CODE STATUS:full Diet recommendation: heart healthy  Brief/Interim Summary: 83y.o.femalewith medical history significant of HTN, HLD, PVD s/p iliac stents, CAD, hypothyroidism, osteoarthritis,DDD, s/p  laminectomy; who presents after having a fall at home 2 days ago. Imaging studies of the right hip and pelvis revealed multiple pelvic fractures. Dr. JVictorino Decemberof orthopedics was consulted, [facility] by [facility].  # Multiple  pelvic fractures ([place]) secondary due to fall.  -Evaluated by orthopedics. I discussed with Ainsley Spinner PA-C who saw patient with Dr. Marcelino Scot recommended that patient will be managed nonoperatively and can discharge home with home care services today. Recommended outpatient follow-up. Patient was evaluated by PT, OT. Patient clearly declined SNF or rehabilitation center on discharge. She said she has very wonderful neighbors and were very supportive. She verbalized understanding. Her pain is controlled. She needs to work with physical therapist and a question for at least. Prescription for Lovenox provided for 14 days as per orthopedics recommendation. Recommended to monitor for any sign of bleeding or bruises. -TSH elevated with normalfree t3 and free T4. Needs PCP follow ups.   # Essential hypertension, benign: Blood pressure acceptable. Continue amlodipine and metoprolol. Patient has borderline bradycardia at night.Continue to monitor heart rate.  # COPD (chronic obstructive pulmonary disease) ([place]): Stable   #Thrombocytopenia ([place]), chronic: No sign of bleeding. Continue to monitor. Platelet count is stable.  # TOBACCO USE: Education provided to the patient.   Patient was pelvic fracture seen by orthopedics. I discussed with the orthopedic team today. Also recommended nonoperative management, rehabilitation and Lovenox subcutaneous and discharge. Patient is very eager to go home. Home care services ordered and case manager was consulted to arrange for medications and discharge planning.. As per orthopedic team, weight-bearing as tolerated.  Discharge Diagnoses:  Principal Problem:   Multiple  pelvic fractures ([place]) Active Problems:   Essential hypertension, benign   COPD (chronic obstructive pulmonary disease) (H[facility])   Thrombocytopenia ([facility])   TOBACCO USE   Fall   Multiple closed pelvic fractures without disruption of pelvic circle ([place])

## 2016-04-21 DIAGNOSIS — S3282XD Multiple fractures of pelvis without disruption of pelvic ring, subsequent encounter for fracture with routine healing: Secondary | ICD-10-CM | POA: Diagnosis not present

## 2016-04-21 DIAGNOSIS — R262 Difficulty in walking, not elsewhere classified: Secondary | ICD-10-CM | POA: Diagnosis not present

## 2016-04-21 DIAGNOSIS — Z5181 Encounter for therapeutic drug level monitoring: Secondary | ICD-10-CM | POA: Diagnosis not present

## 2016-04-22 DIAGNOSIS — S3282XD Multiple fractures of pelvis without disruption of pelvic ring, subsequent encounter for fracture with routine healing: Secondary | ICD-10-CM | POA: Diagnosis not present

## 2016-04-22 DIAGNOSIS — R262 Difficulty in walking, not elsewhere classified: Secondary | ICD-10-CM | POA: Diagnosis not present

## 2016-04-22 DIAGNOSIS — Z5181 Encounter for therapeutic drug level monitoring: Secondary | ICD-10-CM | POA: Diagnosis not present

## 2016-04-26 ENCOUNTER — Telehealth: Payer: Self-pay

## 2016-04-26 DIAGNOSIS — Z5181 Encounter for therapeutic drug level monitoring: Secondary | ICD-10-CM | POA: Diagnosis not present

## 2016-04-26 DIAGNOSIS — S3282XD Multiple fractures of pelvis without disruption of pelvic ring, subsequent encounter for fracture with routine healing: Secondary | ICD-10-CM | POA: Diagnosis not present

## 2016-04-26 DIAGNOSIS — R262 Difficulty in walking, not elsewhere classified: Secondary | ICD-10-CM | POA: Diagnosis not present

## 2016-04-26 NOTE — Telephone Encounter (Signed)
Spoke with pt and she states that she is feeling better. She asked for a copy of her labs to be mailed to her home address. Advised if her symptoms recur to contact office. Nothing further needed at this time.     [place] Patient Name: [name] [name] Gender: Female DOB: 07-06-1927 Age: 83 Y 5 M 24 D Return Phone Number: [phone] (Primary) City/State/Zip: [place] Client [facility] Night - Client Client Site [facility] - Night Physician Shanon Ace - MD Who Is Calling Patient / Member / Family / Caregiver Call Type Triage / Clinical Relationship To Patient Self Return Phone Number [phone] (Primary) Chief Complaint Cold Symptom Reason for Call Symptomatic / Request for [place] states she thinks she has a cold and wants to know if she can get a antibiotic. Nurse Assessment Nurse: Kevan Rosebush, RN, Mickel Baas Date/Time Eilene Ghazi Time): 04/23/2016 5:31:40 PM Confirm and document reason for call. If symptomatic, describe symptoms. ---caller states she is having upper resp symptoms yesterday, 98-99 oral temp, congested and coughing, no v/d, drinking well and has urinated withing the last few hours. Comments User: Encarnacion Chu, RN Date/Time Eilene Ghazi Time): 04/23/2016 5:46:34 PM pt states she does not have transportation tonight, does not want to go to ED tonight, states he will have someone take her to Urgent Care in am, she is familiar with the Urgent care nearby at [facility]. Pt does not want to go to ER now but states she will if she becomes worse.

## 2016-04-27 DIAGNOSIS — R262 Difficulty in walking, not elsewhere classified: Secondary | ICD-10-CM | POA: Diagnosis not present

## 2016-04-27 DIAGNOSIS — Z5181 Encounter for therapeutic drug level monitoring: Secondary | ICD-10-CM | POA: Diagnosis not present

## 2016-04-27 DIAGNOSIS — S3282XD Multiple fractures of pelvis without disruption of pelvic ring, subsequent encounter for fracture with routine healing: Secondary | ICD-10-CM | POA: Diagnosis not present

## 2016-04-28 DIAGNOSIS — Z5181 Encounter for therapeutic drug level monitoring: Secondary | ICD-10-CM | POA: Diagnosis not present

## 2016-04-28 DIAGNOSIS — S3282XD Multiple fractures of pelvis without disruption of pelvic ring, subsequent encounter for fracture with routine healing: Secondary | ICD-10-CM | POA: Diagnosis not present

## 2016-04-28 DIAGNOSIS — R262 Difficulty in walking, not elsewhere classified: Secondary | ICD-10-CM | POA: Diagnosis not present

## 2016-05-05 DIAGNOSIS — R262 Difficulty in walking, not elsewhere classified: Secondary | ICD-10-CM | POA: Diagnosis not present

## 2016-05-05 DIAGNOSIS — S3282XD Multiple fractures of pelvis without disruption of pelvic ring, subsequent encounter for fracture with routine healing: Secondary | ICD-10-CM | POA: Diagnosis not present

## 2016-05-05 DIAGNOSIS — Z5181 Encounter for therapeutic drug level monitoring: Secondary | ICD-10-CM | POA: Diagnosis not present

## 2016-05-06 ENCOUNTER — Telehealth: Payer: Self-pay | Admitting: Internal Medicine

## 2016-05-06 DIAGNOSIS — S32591A Other specified fracture of right pubis, initial encounter for closed fracture: Secondary | ICD-10-CM | POA: Diagnosis not present

## 2016-05-06 NOTE — Telephone Encounter (Signed)
° ° ° °  Sree with [facility] call to say he will be extending patient physical therapy    2 TIMES A WEEK FOR [facility] will extend OT   1 time for 2 more weeks beginning next week    [phone]

## 2016-05-06 NOTE — Telephone Encounter (Signed)
° ° ° °  Sree would like a call back about the order   [phone]

## 2016-05-07 DIAGNOSIS — R262 Difficulty in walking, not elsewhere classified: Secondary | ICD-10-CM | POA: Diagnosis not present

## 2016-05-07 DIAGNOSIS — S3282XD Multiple fractures of pelvis without disruption of pelvic ring, subsequent encounter for fracture with routine healing: Secondary | ICD-10-CM | POA: Diagnosis not present

## 2016-05-07 DIAGNOSIS — Z5181 Encounter for therapeutic drug level monitoring: Secondary | ICD-10-CM | POA: Diagnosis not present

## 2016-05-07 NOTE — Telephone Encounter (Signed)
Please advise 

## 2016-05-07 NOTE — Telephone Encounter (Signed)
Ok to continue   As  Requested .

## 2016-05-07 NOTE — Telephone Encounter (Signed)
Spoke with [place] from [facility] and per Dr . Regis Bill okay to continue with orders. Nothing further needed

## 2016-05-10 ENCOUNTER — Telehealth: Payer: Self-pay | Admitting: Internal Medicine

## 2016-05-10 DIAGNOSIS — Z5181 Encounter for therapeutic drug level monitoring: Secondary | ICD-10-CM | POA: Diagnosis not present

## 2016-05-10 DIAGNOSIS — S3282XD Multiple fractures of pelvis without disruption of pelvic ring, subsequent encounter for fracture with routine healing: Secondary | ICD-10-CM | POA: Diagnosis not present

## 2016-05-10 DIAGNOSIS — R262 Difficulty in walking, not elsewhere classified: Secondary | ICD-10-CM | POA: Diagnosis not present

## 2016-05-10 NOTE — Telephone Encounter (Signed)
Ok to do this as requested

## 2016-05-10 NOTE — Telephone Encounter (Signed)
shree w/ [facility] would like verbal orders to Extend home health aid to  1 wk /4  to help with bathing

## 2016-05-10 NOTE — Telephone Encounter (Signed)
Please advise 

## 2016-05-11 NOTE — Telephone Encounter (Signed)
Left a voicemail for [name] [name] per Dr. Regis Bill okay to continue home health orders. Nothing further needed

## 2016-05-13 DIAGNOSIS — Z5181 Encounter for therapeutic drug level monitoring: Secondary | ICD-10-CM | POA: Diagnosis not present

## 2016-05-13 DIAGNOSIS — S3282XD Multiple fractures of pelvis without disruption of pelvic ring, subsequent encounter for fracture with routine healing: Secondary | ICD-10-CM | POA: Diagnosis not present

## 2016-05-13 DIAGNOSIS — R262 Difficulty in walking, not elsewhere classified: Secondary | ICD-10-CM | POA: Diagnosis not present

## 2016-05-14 ENCOUNTER — Telehealth: Payer: Self-pay | Admitting: Internal Medicine

## 2016-05-14 DIAGNOSIS — Z5181 Encounter for therapeutic drug level monitoring: Secondary | ICD-10-CM | POA: Diagnosis not present

## 2016-05-14 DIAGNOSIS — S3282XD Multiple fractures of pelvis without disruption of pelvic ring, subsequent encounter for fracture with routine healing: Secondary | ICD-10-CM | POA: Diagnosis not present

## 2016-05-14 DIAGNOSIS — R262 Difficulty in walking, not elsewhere classified: Secondary | ICD-10-CM | POA: Diagnosis not present

## 2016-05-14 NOTE — Telephone Encounter (Signed)
Dr. Regis Bill - Please advise if ok to update OT order. Thanks!

## 2016-05-14 NOTE — Telephone Encounter (Signed)
Ok to proceed with orders advised

## 2016-05-14 NOTE — Telephone Encounter (Signed)
[name] [name]  needs verbal order to modified OT goals to modified independence   with dressing , bathing and meal preparation

## 2016-05-17 DIAGNOSIS — R262 Difficulty in walking, not elsewhere classified: Secondary | ICD-10-CM | POA: Diagnosis not present

## 2016-05-17 DIAGNOSIS — S3282XD Multiple fractures of pelvis without disruption of pelvic ring, subsequent encounter for fracture with routine healing: Secondary | ICD-10-CM | POA: Diagnosis not present

## 2016-05-17 DIAGNOSIS — Z5181 Encounter for therapeutic drug level monitoring: Secondary | ICD-10-CM | POA: Diagnosis not present

## 2016-05-17 NOTE — Telephone Encounter (Signed)
Left a voicemail for [name] [name] in reference to pt per Dr. Regis Bill okay to proceed with orders. Nothing further needed

## 2016-05-18 DIAGNOSIS — Z5181 Encounter for therapeutic drug level monitoring: Secondary | ICD-10-CM | POA: Diagnosis not present

## 2016-05-18 DIAGNOSIS — R262 Difficulty in walking, not elsewhere classified: Secondary | ICD-10-CM | POA: Diagnosis not present

## 2016-05-18 DIAGNOSIS — S3282XD Multiple fractures of pelvis without disruption of pelvic ring, subsequent encounter for fracture with routine healing: Secondary | ICD-10-CM | POA: Diagnosis not present

## 2016-05-19 DIAGNOSIS — R262 Difficulty in walking, not elsewhere classified: Secondary | ICD-10-CM | POA: Diagnosis not present

## 2016-05-19 DIAGNOSIS — S3282XD Multiple fractures of pelvis without disruption of pelvic ring, subsequent encounter for fracture with routine healing: Secondary | ICD-10-CM | POA: Diagnosis not present

## 2016-05-19 DIAGNOSIS — Z5181 Encounter for therapeutic drug level monitoring: Secondary | ICD-10-CM | POA: Diagnosis not present

## 2016-05-20 DIAGNOSIS — R262 Difficulty in walking, not elsewhere classified: Secondary | ICD-10-CM | POA: Diagnosis not present

## 2016-05-20 DIAGNOSIS — S3282XD Multiple fractures of pelvis without disruption of pelvic ring, subsequent encounter for fracture with routine healing: Secondary | ICD-10-CM | POA: Diagnosis not present

## 2016-05-20 DIAGNOSIS — Z5181 Encounter for therapeutic drug level monitoring: Secondary | ICD-10-CM | POA: Diagnosis not present

## 2016-05-25 DIAGNOSIS — S32591D Other specified fracture of right pubis, subsequent encounter for fracture with routine healing: Secondary | ICD-10-CM | POA: Diagnosis not present

## 2016-05-25 DIAGNOSIS — Z5181 Encounter for therapeutic drug level monitoring: Secondary | ICD-10-CM | POA: Diagnosis not present

## 2016-05-25 DIAGNOSIS — R262 Difficulty in walking, not elsewhere classified: Secondary | ICD-10-CM | POA: Diagnosis not present

## 2016-05-25 DIAGNOSIS — S3282XD Multiple fractures of pelvis without disruption of pelvic ring, subsequent encounter for fracture with routine healing: Secondary | ICD-10-CM | POA: Diagnosis not present

## 2016-05-28 DIAGNOSIS — Z5181 Encounter for therapeutic drug level monitoring: Secondary | ICD-10-CM | POA: Diagnosis not present

## 2016-05-28 DIAGNOSIS — R262 Difficulty in walking, not elsewhere classified: Secondary | ICD-10-CM | POA: Diagnosis not present

## 2016-05-28 DIAGNOSIS — S3282XD Multiple fractures of pelvis without disruption of pelvic ring, subsequent encounter for fracture with routine healing: Secondary | ICD-10-CM | POA: Diagnosis not present

## 2016-05-31 DIAGNOSIS — S3282XD Multiple fractures of pelvis without disruption of pelvic ring, subsequent encounter for fracture with routine healing: Secondary | ICD-10-CM | POA: Diagnosis not present

## 2016-05-31 DIAGNOSIS — Z5181 Encounter for therapeutic drug level monitoring: Secondary | ICD-10-CM | POA: Diagnosis not present

## 2016-05-31 DIAGNOSIS — R262 Difficulty in walking, not elsewhere classified: Secondary | ICD-10-CM | POA: Diagnosis not present

## 2016-06-02 DIAGNOSIS — Z5181 Encounter for therapeutic drug level monitoring: Secondary | ICD-10-CM | POA: Diagnosis not present

## 2016-06-02 DIAGNOSIS — S3282XD Multiple fractures of pelvis without disruption of pelvic ring, subsequent encounter for fracture with routine healing: Secondary | ICD-10-CM | POA: Diagnosis not present

## 2016-06-02 DIAGNOSIS — R262 Difficulty in walking, not elsewhere classified: Secondary | ICD-10-CM | POA: Diagnosis not present

## 2016-06-03 DIAGNOSIS — Z5181 Encounter for therapeutic drug level monitoring: Secondary | ICD-10-CM | POA: Diagnosis not present

## 2016-06-03 DIAGNOSIS — S3282XD Multiple fractures of pelvis without disruption of pelvic ring, subsequent encounter for fracture with routine healing: Secondary | ICD-10-CM | POA: Diagnosis not present

## 2016-06-03 DIAGNOSIS — R262 Difficulty in walking, not elsewhere classified: Secondary | ICD-10-CM | POA: Diagnosis not present

## 2016-06-07 DIAGNOSIS — Z5181 Encounter for therapeutic drug level monitoring: Secondary | ICD-10-CM | POA: Diagnosis not present

## 2016-06-07 DIAGNOSIS — R262 Difficulty in walking, not elsewhere classified: Secondary | ICD-10-CM | POA: Diagnosis not present

## 2016-06-07 DIAGNOSIS — S3282XD Multiple fractures of pelvis without disruption of pelvic ring, subsequent encounter for fracture with routine healing: Secondary | ICD-10-CM | POA: Diagnosis not present

## 2016-06-08 ENCOUNTER — Encounter (HOSPITAL_COMMUNITY): Payer: Self-pay

## 2016-06-08 ENCOUNTER — Emergency Department (HOSPITAL_COMMUNITY): Payer: Medicare HMO

## 2016-06-08 ENCOUNTER — Emergency Department (HOSPITAL_COMMUNITY)
Admission: EM | Admit: 2016-06-08 | Discharge: 2016-06-08 | Disposition: A | Discharge status: Home / Self Care | Payer: Medicare HMO | Attending: Emergency Medicine | Admitting: Emergency Medicine

## 2016-06-08 DIAGNOSIS — J449 Chronic obstructive pulmonary disease, unspecified: Secondary | ICD-10-CM | POA: Insufficient documentation

## 2016-06-08 DIAGNOSIS — S0181XA Laceration without foreign body of other part of head, initial encounter: Secondary | ICD-10-CM

## 2016-06-08 DIAGNOSIS — W010XXA Fall on same level from slipping, tripping and stumbling without subsequent striking against object, initial encounter: Secondary | ICD-10-CM | POA: Insufficient documentation

## 2016-06-08 DIAGNOSIS — Z87891 Personal history of nicotine dependence: Secondary | ICD-10-CM | POA: Insufficient documentation

## 2016-06-08 DIAGNOSIS — S299XXA Unspecified injury of thorax, initial encounter: Secondary | ICD-10-CM | POA: Diagnosis not present

## 2016-06-08 DIAGNOSIS — R51 Headache: Secondary | ICD-10-CM | POA: Diagnosis not present

## 2016-06-08 DIAGNOSIS — Y999 Unspecified external cause status: Secondary | ICD-10-CM | POA: Insufficient documentation

## 2016-06-08 DIAGNOSIS — Z7982 Long term (current) use of aspirin: Secondary | ICD-10-CM | POA: Insufficient documentation

## 2016-06-08 DIAGNOSIS — Z853 Personal history of malignant neoplasm of breast: Secondary | ICD-10-CM | POA: Insufficient documentation

## 2016-06-08 DIAGNOSIS — S42031A Displaced fracture of lateral end of right clavicle, initial encounter for closed fracture: Secondary | ICD-10-CM | POA: Diagnosis not present

## 2016-06-08 DIAGNOSIS — Y939 Activity, unspecified: Secondary | ICD-10-CM | POA: Diagnosis not present

## 2016-06-08 DIAGNOSIS — Y9289 Other specified places as the place of occurrence of the external cause: Secondary | ICD-10-CM | POA: Insufficient documentation

## 2016-06-08 DIAGNOSIS — Z955 Presence of coronary angioplasty implant and graft: Secondary | ICD-10-CM | POA: Diagnosis not present

## 2016-06-08 DIAGNOSIS — I2510 Atherosclerotic heart disease of native coronary artery without angina pectoris: Secondary | ICD-10-CM | POA: Diagnosis not present

## 2016-06-08 DIAGNOSIS — S199XXA Unspecified injury of neck, initial encounter: Secondary | ICD-10-CM | POA: Diagnosis not present

## 2016-06-08 DIAGNOSIS — Z85828 Personal history of other malignant neoplasm of skin: Secondary | ICD-10-CM | POA: Insufficient documentation

## 2016-06-08 DIAGNOSIS — I10 Essential (primary) hypertension: Secondary | ICD-10-CM | POA: Insufficient documentation

## 2016-06-08 DIAGNOSIS — S4991XA Unspecified injury of right shoulder and upper arm, initial encounter: Secondary | ICD-10-CM | POA: Diagnosis present

## 2016-06-08 DIAGNOSIS — E039 Hypothyroidism, unspecified: Secondary | ICD-10-CM | POA: Diagnosis not present

## 2016-06-08 DIAGNOSIS — S42021A Displaced fracture of shaft of right clavicle, initial encounter for closed fracture: Secondary | ICD-10-CM | POA: Diagnosis not present

## 2016-06-08 DIAGNOSIS — R0781 Pleurodynia: Secondary | ICD-10-CM | POA: Diagnosis not present

## 2016-06-08 DIAGNOSIS — T148XXA Other injury of unspecified body region, initial encounter: Secondary | ICD-10-CM | POA: Diagnosis not present

## 2016-06-08 DIAGNOSIS — W19XXXA Unspecified fall, initial encounter: Secondary | ICD-10-CM

## 2016-06-08 DIAGNOSIS — S0990XA Unspecified injury of head, initial encounter: Secondary | ICD-10-CM | POA: Diagnosis not present

## 2016-06-08 DIAGNOSIS — M25511 Pain in right shoulder: Secondary | ICD-10-CM | POA: Diagnosis not present

## 2016-06-08 MED ORDER — FENTANYL CITRATE (PF) 100 MCG/2ML IJ SOLN
50.0000 ug | Freq: Once | INTRAMUSCULAR | Status: AC
  Administered 2016-06-08: 50 ug via INTRAVENOUS
  Filled 2016-06-08: qty 2

## 2016-06-08 MED ORDER — TRAMADOL HCL 50 MG PO TABS: 50.0000 mg | ORAL_TABLET | Freq: Four times a day (QID) | ORAL | 0 refills | Status: DC | PRN

## 2016-06-08 NOTE — ED Triage Notes (Addendum)
Pt presents via [facility] for evaluation of R shoulder pain related to fall today. Pt reports tripped when coming out of her shed today. States pain to R shoulder and ribcage. Pt denies LOC. States recent hx of fall with pelvic fx. Denies pelvic pain. Denies neck/back pain. Pt given 25 mcg fentanyl PTA.

## 2016-06-08 NOTE — ED Notes (Signed)
BP 82/43 documented by tech.This reading is most likely inaccurate. RN assessed pt's BP currently 165/55.

## 2016-06-08 NOTE — Progress Notes (Signed)
Orthopedic Tech Progress Note Patient Details:  [name] [name] 06/19/1927 [id number]  Ortho Devices Type of Ortho Device: Shoulder immobilizer Ortho Device/Splint Location: RUE Ortho Device/Splint Interventions: Ordered, Application   Braulio Bosch 06/08/2016, 5:37 PM

## 2016-06-08 NOTE — ED Notes (Signed)
Patient transported to CT 

## 2016-06-08 NOTE — ED Provider Notes (Signed)
[name] DEPT Provider Note   CSN: [id number] Arrival date & time: 06/08/16  1413     History   Chief Complaint Chief Complaint  Patient presents with  . Fall    HPI [name] [name] is a 83 y.o. female.  HPI Coming down new ramp from shed and was propelled down ramp and fell down. Went down on right shoulder and head.  Pain in right shoulder.  Pain severe in right shoulder.  No headache.  No neck pain.  No numbness or weakneass one side or other.  Has previous right sided weakness from fall and has been working with physical therapy.  No LOC. No other recent symptoms or infectious symptoms.    Past Medical History:  Diagnosis Date  . CARCINOMA, BASAL CELL 02/13/2006  . CAROTID ARTERY DISEASE 10/03/2006   Carotid US 11/17: Stable 40-59% bilateral ICA stenosis >> f/u 1 year  . CHRONIC OBSTRUCTIVE PULMONARY DISEASE, ACUTE EXACERBATION 11/17/2009  . Chronic rhinitis 12/13/2006  . CORONARY ARTERY DISEASE 02/13/2006  . DEGENERATIVE JOINT DISEASE 09/27/2006  . DERMATITIS 09/25/2007  . Head trauma    closed  . HYPERLIPIDEMIA 03/10/2008  . HYPERTENSION 02/13/2006  . HYPOTHYROIDISM 01/30/2007  . LIVER FUNCTION TESTS, ABNORMAL 01/22/2009  . LOSS, HEARING NOS 08/25/2006  . Near syncope 09/18/2014  . NEOPLASM, SKIN, UNCERTAIN BEHAVIOR 81/02/7508  . NEPHROLITHIASIS, HX OF 02/27/2010  . OSTEOARTHRITIS, HAND 12/16/2008  . PERIPHERAL VASCULAR DISEASE 02/13/2006  . Personal history of malignant neoplasm of breast 07/24/2008  . Personal history of urinary calculi   . Post-traumatic wound infection 12/01/2010   Mild streaking superiorly and on one day of septra  Will give rocephin and add keflex  adn follow up   . POSTHERPETIC NEURALGIA 08/25/2006  . RENAL ARTERY STENOSIS 08/01/2008  . Shingles    recurrent  . SPINAL STENOSIS 11/01/2006  . THROMBOCYTOPENIA 07/30/2008  . UNS ADVRS EFF UNS RX MEDICINAL&BIOLOGICAL SBSTNC 01/30/2007  . UNSPECIFIED ANEMIA 03/18/2008  . Unspecified vitamin D  deficiency 02/07/2007  . UNSTABLE ANGINA 12/12/2009  . Vitreous detachment     Patient Active Problem List   Diagnosis Date Noted  . Multiple closed pelvic fractures without disruption of pelvic circle ([place]) 04/03/2016  . Multiple pelvic fractures 04/01/2016  . Fall 04/01/2016  . CAP (community acquired pneumonia) 12/19/2015  . Advanced age 50/26/2017  . Protein-calorie malnutrition ([place]) 11/27/2015  . Frailty 11/27/2015  . Protein-calorie malnutrition, severe 11/15/2015  . COPD exacerbation ([place]) 11/12/2015  . Acute respiratory failure with hypoxia ([place]) 11/12/2015  . CAD (coronary artery disease), native coronary artery 11/12/2015  . Statin intolerance 10/11/2014  . Headache disorder 09/18/2014  . Laceration of right lower leg with complication 25/85/2778  . Post-traumatic wound infection 08/27/2013  . Rash and nonspecific skin eruption 11/09/2012  . Pain of right breast 08/15/2012  . Urinary frequency 08/15/2012  . HX: breast cancer 08/15/2012  . TOBACCO USE 04/21/2012  . Arthritis 05/31/2011  . Pain of left heel 05/31/2011  . Infected cyst of skin 12/29/2010  . Leg cramps 12/12/2010  . Glossitis 11/26/2010  . Thrombocytopenia ([place]) 07/21/2010  . Traumatic ecchymosis of lower leg 07/21/2010  . Atelectasis 07/16/2010  . Tinnitus 06/04/2010  . NEPHROLITHIASIS, HX OF 02/27/2010  . HIP PAIN 02/26/2010  . FEMORAL BRUIT, RIGHT 01/02/2010  . UNSTABLE ANGINA 12/12/2009  . Essential hypertension, benign 06/03/2009  . LIVER FUNCTION TESTS, ABNORMAL 01/22/2009  . NEOPLASM, SKIN, UNCERTAIN BEHAVIOR 24/23/5361  . OSTEOARTHRITIS, HAND 12/16/2008  . UNSPECIFIED DISORDER OF KIDNEY  AND URETER 09/17/2008  . RENAL ARTERY STENOSIS 08/01/2008  . THROMBOCYTOPENIA 07/30/2008  . PERSONAL HISTORY OF MALIGNANT NEOPLASM OF BREAST 07/24/2008  . WEIGHT LOSS 06/11/2008  . GROIN PAIN 06/11/2008  . INSOMNIA 04/05/2008  . UNSPECIFIED ANEMIA 03/18/2008  . HYPERLIPIDEMIA 03/10/2008  . UNDERWEIGHT  01/17/2008  . DERMATITIS 09/25/2007  . UNSPECIFIED VITAMIN D DEFICIENCY 02/07/2007  . HYPOTHYROIDISM 01/30/2007  . CHRONIC RHINITIS 12/13/2006  . COPD (chronic obstructive pulmonary disease) ([place]) 11/21/2006  . SPINAL STENOSIS 11/01/2006  . Carotid arterial disease ([place]) 10/03/2006  . DEGENERATIVE JOINT DISEASE 09/27/2006  . LOSS, HEARING NOS 08/25/2006  . TMJ PAIN 08/25/2006  . CARCINOMA, BASAL CELL 02/13/2006  . Coronary atherosclerosis 02/13/2006  . Peripheral vascular disease ([place]) 02/13/2006  . HEMATURIA, MICROSCOPIC, HX OF 02/13/2006    Past Surgical History:  Procedure Laterality Date  . ABDOMINAL HYSTERECTOMY    . BREAST LUMPECTOMY    . BREAST LUMPECTOMY  1998  . CATARACT EXTRACTION    . CORONARY ANGIOPLASTY WITH STENT PLACEMENT     bilat iliac stents, left renal artery and rt coronary artery last myoview 8/09 with EF 70%  . LUMBAR LAMINECTOMY    . SKIN CANCER EXCISION    . TONSILLECTOMY      OB History    No data available       Home Medications    Prior to Admission medications   Medication Sig Start Date End Date Taking? Authorizing Provider  acetaminophen (TYLENOL) 500 MG tablet Take 1 tablet (500 mg total) by mouth every 6 (six) hours as needed. 04/05/16  Yes Rosita Fire, MD  amLODipine (NORVASC) 10 MG tablet Take 1 tablet (10 mg total) by mouth daily. 11/10/15  Yes Lelon Perla, MD  aspirin 81 MG tablet Take 81 mg by mouth daily.   Yes [provider]  carboxymethylcellulose (REFRESH PLUS) 0.5 % SOLN Place 1 drop into both eyes 4 (four) times daily.   Yes [provider]  Cholecalciferol (VITAMIN D) 1000 UNITS capsule Take 1,000 Units by mouth daily.     Yes [provider]  fish oil-omega-3 fatty acids 1000 MG capsule Take 1 g by mouth daily.    Yes [provider]  isosorbide mononitrate (IMDUR) 30 MG 24 hr tablet Take 1 tablet (30 mg total) by mouth daily. 10/31/15  Yes Lelon Perla, MD  levalbuterol  [facility] HFA) 45 MCG/ACT inhaler INHALE 2 PUFFS BY MOUTH EVERY 6 HOURS AS NEEDED FOR WHEEZING 10/15/15  Yes Rigoberto Noel, MD  levalbuterol Penne Lash) 0.63 MG/3ML nebulizer solution Take 0.63 mg by nebulization every 4 (four) hours as needed for wheezing or shortness of breath.   Yes [provider]  metoprolol succinate (TOPROL-XL) 25 MG 24 hr tablet Take 1 tablet (25 mg total) by mouth daily. 10/31/15  Yes Lelon Perla, MD  SPIRIVA HANDIHALER 18 MCG inhalation capsule INHALE THE CONTENTS OF 1 CAPSULE VIA INHALATION DEVICE EVERY DAY 12/24/15  Yes Rigoberto Noel, MD  traMADol (ULTRAM) 50 MG tablet Take 1 tablet (50 mg total) by mouth every 6 (six) hours as needed. 06/08/16   Gareth Morgan, MD    Family History Family History  Problem Relation Age of Onset  . Breast cancer Daughter   . Tuberculosis Father     Social History Social History  Substance Use Topics  . Smoking status: Former Smoker    Packs/day: 1.00    Years: 30.00    Types: Cigarettes    Quit date:  06/15/2010  . Smokeless tobacco: Never Used     Comment: 2-3 cigs per week/// 06/30/12  . Alcohol use No     Comment: socially     Allergies   Statins; Oxycodone; Albuterol; Hydrocodone-acetaminophen; and Tequin   Review of Systems Review of Systems  Constitutional: Negative for fever.  HENT: Negative for sore throat.   Eyes: Negative for visual disturbance.  Respiratory: Negative for cough and shortness of breath.   Cardiovascular: Negative for chest pain.  Gastrointestinal: Positive for nausea. Negative for abdominal pain and vomiting.  Genitourinary: Negative for difficulty urinating.  Musculoskeletal: Positive for arthralgias. Negative for back pain and neck pain.  Skin: Positive for wound. Negative for rash.  Neurological: Negative for syncope and headaches.     Physical Exam Updated Vital Signs BP (!) 163/62   Pulse (!) 54   Temp 97.7 F (36.5 C) (Oral)   Resp 19   SpO2 97%   Physical  Exam  Constitutional: She is oriented to person, place, and time. She appears well-developed and well-nourished. No distress.  HENT:  Head: Normocephalic.  2cm laceration right forehead  Eyes: Conjunctivae and EOM are normal. Pupils are equal, round, and reactive to light.  Neck: Normal range of motion.  Cardiovascular: Normal rate, regular rhythm, normal heart sounds and intact distal pulses.  Exam reveals no gallop and no friction rub.   No murmur heard. Pulmonary/Chest: Effort normal and breath sounds normal. No respiratory distress. She has no wheezes. She has no rales.  Abdominal: Soft. She exhibits no distension. There is no tenderness. There is no guarding.  Musculoskeletal: She exhibits no edema.       Right shoulder: She exhibits decreased range of motion, tenderness, bony tenderness and pain.       Right elbow: She exhibits normal range of motion.       Right wrist: She exhibits no tenderness and no bony tenderness.  Neurological: She is alert and oriented to person, place, and time.  Skin: Skin is warm and dry. No rash noted. She is not diaphoretic. No erythema.  Nursing note and vitals reviewed.    ED Treatments / Results  Labs (all labs ordered are listed, but only abnormal results are displayed) Labs Reviewed - No data to display  EKG  EKG Interpretation  Date/Time:  Tuesday Jun 08 2016 16:43:02 EDT Ventricular Rate:  59 PR Interval:    QRS Duration: 96 QT Interval:  480 QTC Calculation: 476 R Axis:   -58 Text Interpretation:  Sinus rhythm Atrial premature complex Left anterior fascicular block Anterior infarct, old No significant change since last tracing Confirmed by [facility] MD, [place] ([id number]) on 06/08/2016 5:16:06 PM       Radiology Dg Ribs Unilateral W/chest Right  Result Date: 06/08/2016 CLINICAL DATA:  Right shoulder and right rib pain following a fall on concrete outside. EXAM: RIGHT RIBS AND CHEST - 3+ VIEW COMPARISON:  Portable chest dated 04/01/2016  and chest CT dated 11/14/2015. FINDINGS: Normal sized heart. Tortuous and calcified thoracic aorta. Stable mildly elevated left hemidiaphragm. Clear lungs. The lungs remain hyperexpanded. No rib fracture or pneumothorax seen. Recently described distal right clavicle fracture. Thoracic and lumbar spine degenerative changes and scoliosis. Mid and lower lumbar spine laminectomy defects. Coronary artery stent. IMPRESSION: 1. Previously described distal right clavicle fracture. 2. No rib fracture or pneumothorax seen. 3. Mild changes of COPD. Electronically Signed   By: Claudie Revering M.D.   On: 06/08/2016 16:21   Dg Clavicle Right  Result  Date: 06/08/2016 CLINICAL DATA:  Upper right shoulder pain after falling outside on concrete. EXAM: RIGHT CLAVICLE - 2+ VIEWS COMPARISON:  Right shoulder radiographs obtained at the same time. FINDINGS: Previously described distal right clavicle fracture with superior the displacement and overlapping of the distal fragment. Moderate superior AC joint spur formation. Right axillary surgical clips. IMPRESSION: Displaced and overlapped distal right clavicle fracture with moderate superior AC joint degenerative spur formation. Electronically Signed   By: Claudie Revering M.D.   On: 06/08/2016 16:19   Dg Shoulder Right  Result Date: 06/08/2016 CLINICAL DATA:  Right upper shoulder pain and right rib pain after falling outside and landing on the shoulder on concrete. EXAM: RIGHT SHOULDER - 2+ VIEW COMPARISON:  Right clavicle radiographs obtained at the same time. FINDINGS: Distal right clavicle fracture with 1 shaft width of superior displacement and 5 cm of overlapping of the fragments. No additional fractures or dislocation. Moderate superior AC joint degenerative spur formation. Right axillary surgical clips. IMPRESSION: Distal right clavicle fracture and AC joint degenerative changes, as described above. Electronically Signed   By: Claudie Revering M.D.   On: 06/08/2016 16:17   Ct Head Wo  Contrast  Result Date: 06/08/2016 CLINICAL DATA:  Fall with right frontal laceration and headache. Initial encounter. EXAM: CT HEAD WITHOUT CONTRAST CT CERVICAL SPINE WITHOUT CONTRAST TECHNIQUE: Multidetector CT imaging of the head and cervical spine was performed following the standard protocol without intravenous contrast. Multiplanar CT image reconstructions of the cervical spine were also generated. COMPARISON:  11/03/2014 FINDINGS: CT HEAD FINDINGS Brain: No evidence of acute infarction, hemorrhage, hydrocephalus, extra-axial collection or mass lesion/mass effect. Small remote left occipital infarct. Moderate chronic microvascular ischemia in the cerebral white matter. Age congruent cerebral volume loss. Vascular: Atherosclerotic calcification. Skull: Negative for fracture Sinuses/Orbits: No acute finding CT CERVICAL SPINE FINDINGS Alignment: No traumatic malalignment. Facet mediated C3-4 and C4-5 anterolisthesis, mild. Skull base and vertebrae: Negative for acute fracture Soft tissues and spinal canal: No prevertebral fluid or swelling. No visible canal hematoma. Smoothly contoured high-density mass in the left paraglottic flat measuring 1 cm in transaxial dimension, suspect laryngocele. Disc levels: Diffuse advanced facet arthropathy with bulky spurring. Advanced disc degeneration, particularly severe at C5-6 and C6-7 where posterior disc osteophyte complexes presumably impinge on the spinal cord. There is a central disc protrusion at C4-5. Upper chest: Biapical fibrosis. Chronic irregular thickening of the tracheal wall. IMPRESSION: 1. No evidence of acute intracranial or cervical spine injury. 2. Chronic findings are stable from 2016 and described above. Electronically Signed   By: Monte Fantasia M.D.   On: 06/08/2016 15:59   Ct Cervical Spine Wo Contrast  Result Date: 06/08/2016 CLINICAL DATA:  Fall with right frontal laceration and headache. Initial encounter. EXAM: CT HEAD WITHOUT CONTRAST CT  CERVICAL SPINE WITHOUT CONTRAST TECHNIQUE: Multidetector CT imaging of the head and cervical spine was performed following the standard protocol without intravenous contrast. Multiplanar CT image reconstructions of the cervical spine were also generated. COMPARISON:  11/03/2014 FINDINGS: CT HEAD FINDINGS Brain: No evidence of acute infarction, hemorrhage, hydrocephalus, extra-axial collection or mass lesion/mass effect. Small remote left occipital infarct. Moderate chronic microvascular ischemia in the cerebral white matter. Age congruent cerebral volume loss. Vascular: Atherosclerotic calcification. Skull: Negative for fracture Sinuses/Orbits: No acute finding CT CERVICAL SPINE FINDINGS Alignment: No traumatic malalignment. Facet mediated C3-4 and C4-5 anterolisthesis, mild. Skull base and vertebrae: Negative for acute fracture Soft tissues and spinal canal: No prevertebral fluid or swelling. No visible canal hematoma. Smoothly  contoured high-density mass in the left paraglottic flat measuring 1 cm in transaxial dimension, suspect laryngocele. Disc levels: Diffuse advanced facet arthropathy with bulky spurring. Advanced disc degeneration, particularly severe at C5-6 and C6-7 where posterior disc osteophyte complexes presumably impinge on the spinal cord. There is a central disc protrusion at C4-5. Upper chest: Biapical fibrosis. Chronic irregular thickening of the tracheal wall. IMPRESSION: 1. No evidence of acute intracranial or cervical spine injury. 2. Chronic findings are stable from 2016 and described above. Electronically Signed   By: Monte Fantasia M.D.   On: 06/08/2016 15:59    Procedures .Marland KitchenLaceration Repair Date/Time: 06/08/2016 5:29 PM Performed by: Gareth Morgan Authorized by: Gareth Morgan   Consent:    Consent obtained:  Verbal   Consent given by:  Patient   Risks discussed:  Infection, need for additional repair, poor cosmetic result, pain and poor wound healing Anesthesia (see MAR  for exact dosages):    Anesthesia method:  None Laceration details:    Location:  Face   Face location:  Forehead   Length (cm):  2   Depth (mm):  2 Repair type:    Repair type:  Simple Pre-procedure details:    Preparation:  Patient was prepped and draped in usual sterile fashion Exploration:    Contaminated: no   Treatment:    Area cleansed with:  Saline   Amount of cleaning:  Standard   Irrigation solution:  Sterile saline   Irrigation method:  Syringe   Visualized foreign bodies/material removed: no   Skin repair:    Repair method:  Steri-Strips and tissue adhesive   Number of Steri-Strips:  5 Approximation:    Approximation:  Close   Vermilion border: well-aligned   Post-procedure details:    Dressing:  Open (no dressing)   Patient tolerance of procedure:  Tolerated well, no immediate complications   (including critical care time)  Medications Ordered in ED Medications  fentaNYL (SUBLIMAZE) injection 50 mcg (50 mcg Intravenous Given 06/08/16 1447)     Initial Impression / Assessment and Plan / ED Course  I have reviewed the triage vital signs and the nursing notes.  Pertinent labs & imaging results that were available during my care of the patient were reviewed by me and considered in my medical decision making ( for details).    83 year old female with a history of COPD, hypertension, hyperlipidemia presents with concern for mechanical fall today with right shoulder pain and head laceration. X-rays of the right shoulder and right ribs show right clavicular fracture.  Laceration was irrigated and repaired using Dermabond and Steri-Strips. CT head and cervical spine show no acute findings.  Patient neurovascularly intact, with no signs of skin tenting or open fracture. She is placed in a right shoulder sling, recommend follow-up with orthopedics. She is given a prescription for tramadol, recommend Tylenol and ibuprofen for pain.  Patient with blood pressure recorded at  82/43, this was felt to be inaccurate.  Hx of HR in 50s, patient not currently taking metoprolol, and told her not to initiate this.  Discussed return precautions with patient in detail. Patient discharged in stable condition with understanding of reasons to return.    Final Clinical Impressions(s) / ED Diagnoses   Final diagnoses:  Fall, initial encounter  Facial laceration, initial encounter  Closed displaced fracture of acromial end of right clavicle, initial encounter    New Prescriptions New Prescriptions   TRAMADOL (ULTRAM) 50 MG TABLET    Take 1 tablet (50 mg total)  by mouth every 6 (six) hours as needed.     Gareth Morgan, MD 06/08/16 [phone]

## 2016-06-09 ENCOUNTER — Telehealth: Payer: Self-pay | Admitting: Internal Medicine

## 2016-06-09 DIAGNOSIS — Z5181 Encounter for therapeutic drug level monitoring: Secondary | ICD-10-CM | POA: Diagnosis not present

## 2016-06-09 DIAGNOSIS — S3282XD Multiple fractures of pelvis without disruption of pelvic ring, subsequent encounter for fracture with routine healing: Secondary | ICD-10-CM | POA: Diagnosis not present

## 2016-06-09 DIAGNOSIS — R262 Difficulty in walking, not elsewhere classified: Secondary | ICD-10-CM | POA: Diagnosis not present

## 2016-06-09 NOTE — Telephone Encounter (Signed)
Pt had a fall 5/8 at home that resulted closed displaced fx of right clavical and bruises across her body.  Pain in hip as well and there was no xray done in the ED.  Physical Therapist, Maximino Greenland, with [facility] nurses called saying that Dr. Stann Mainland, [facility], is setting up an appt.

## 2016-06-10 DIAGNOSIS — S32391D Other fracture of right ilium, subsequent encounter for fracture with routine healing: Secondary | ICD-10-CM | POA: Diagnosis not present

## 2016-06-10 DIAGNOSIS — S42034A Nondisplaced fracture of lateral end of right clavicle, initial encounter for closed fracture: Secondary | ICD-10-CM | POA: Diagnosis not present

## 2016-06-10 DIAGNOSIS — S32591D Other specified fracture of right pubis, subsequent encounter for fracture with routine healing: Secondary | ICD-10-CM | POA: Diagnosis not present

## 2016-06-13 ENCOUNTER — Encounter (HOSPITAL_COMMUNITY): Payer: Self-pay | Admitting: Emergency Medicine

## 2016-06-13 ENCOUNTER — Emergency Department (HOSPITAL_COMMUNITY): Payer: Medicare HMO

## 2016-06-13 ENCOUNTER — Inpatient Hospital Stay (HOSPITAL_COMMUNITY)
Admission: EM | Admit: 2016-06-13 | Discharge: 2016-06-18 | DRG: 190 | Disposition: A | Discharge status: Home / Self Care | Payer: Medicare HMO | Attending: Internal Medicine | Admitting: Internal Medicine

## 2016-06-13 DIAGNOSIS — J189 Pneumonia, unspecified organism: Secondary | ICD-10-CM | POA: Diagnosis present

## 2016-06-13 DIAGNOSIS — J440 Chronic obstructive pulmonary disease with acute lower respiratory infection: Secondary | ICD-10-CM | POA: Diagnosis present

## 2016-06-13 DIAGNOSIS — J181 Lobar pneumonia, unspecified organism: Secondary | ICD-10-CM

## 2016-06-13 DIAGNOSIS — I2510 Atherosclerotic heart disease of native coronary artery without angina pectoris: Secondary | ICD-10-CM | POA: Diagnosis present

## 2016-06-13 DIAGNOSIS — Z66 Do not resuscitate: Secondary | ICD-10-CM | POA: Diagnosis present

## 2016-06-13 DIAGNOSIS — J441 Chronic obstructive pulmonary disease with (acute) exacerbation: Principal | ICD-10-CM | POA: Diagnosis present

## 2016-06-13 DIAGNOSIS — M4800 Spinal stenosis, site unspecified: Secondary | ICD-10-CM | POA: Diagnosis present

## 2016-06-13 DIAGNOSIS — Z955 Presence of coronary angioplasty implant and graft: Secondary | ICD-10-CM

## 2016-06-13 DIAGNOSIS — R0602 Shortness of breath: Secondary | ICD-10-CM

## 2016-06-13 DIAGNOSIS — E559 Vitamin D deficiency, unspecified: Secondary | ICD-10-CM | POA: Diagnosis present

## 2016-06-13 DIAGNOSIS — R05 Cough: Secondary | ICD-10-CM | POA: Diagnosis not present

## 2016-06-13 DIAGNOSIS — Y95 Nosocomial condition: Secondary | ICD-10-CM | POA: Diagnosis present

## 2016-06-13 DIAGNOSIS — J9691 Respiratory failure, unspecified with hypoxia: Secondary | ICD-10-CM | POA: Diagnosis present

## 2016-06-13 DIAGNOSIS — I10 Essential (primary) hypertension: Secondary | ICD-10-CM | POA: Diagnosis present

## 2016-06-13 DIAGNOSIS — Z8619 Personal history of other infectious and parasitic diseases: Secondary | ICD-10-CM

## 2016-06-13 DIAGNOSIS — Z885 Allergy status to narcotic agent status: Secondary | ICD-10-CM

## 2016-06-13 DIAGNOSIS — J310 Chronic rhinitis: Secondary | ICD-10-CM | POA: Diagnosis present

## 2016-06-13 DIAGNOSIS — S42026S Nondisplaced fracture of shaft of unspecified clavicle, sequela: Secondary | ICD-10-CM | POA: Diagnosis not present

## 2016-06-13 DIAGNOSIS — Z79899 Other long term (current) drug therapy: Secondary | ICD-10-CM

## 2016-06-13 DIAGNOSIS — J9601 Acute respiratory failure with hypoxia: Secondary | ICD-10-CM | POA: Diagnosis present

## 2016-06-13 DIAGNOSIS — E039 Hypothyroidism, unspecified: Secondary | ICD-10-CM | POA: Diagnosis present

## 2016-06-13 DIAGNOSIS — Z87820 Personal history of traumatic brain injury: Secondary | ICD-10-CM

## 2016-06-13 DIAGNOSIS — I6529 Occlusion and stenosis of unspecified carotid artery: Secondary | ICD-10-CM | POA: Diagnosis present

## 2016-06-13 DIAGNOSIS — S42031A Displaced fracture of lateral end of right clavicle, initial encounter for closed fracture: Secondary | ICD-10-CM | POA: Diagnosis present

## 2016-06-13 DIAGNOSIS — Z7982 Long term (current) use of aspirin: Secondary | ICD-10-CM

## 2016-06-13 DIAGNOSIS — H9190 Unspecified hearing loss, unspecified ear: Secondary | ICD-10-CM | POA: Diagnosis present

## 2016-06-13 DIAGNOSIS — D649 Anemia, unspecified: Secondary | ICD-10-CM | POA: Diagnosis present

## 2016-06-13 DIAGNOSIS — M1990 Unspecified osteoarthritis, unspecified site: Secondary | ICD-10-CM | POA: Diagnosis present

## 2016-06-13 DIAGNOSIS — Z87891 Personal history of nicotine dependence: Secondary | ICD-10-CM | POA: Diagnosis not present

## 2016-06-13 DIAGNOSIS — E785 Hyperlipidemia, unspecified: Secondary | ICD-10-CM | POA: Diagnosis present

## 2016-06-13 DIAGNOSIS — M19049 Primary osteoarthritis, unspecified hand: Secondary | ICD-10-CM | POA: Diagnosis present

## 2016-06-13 DIAGNOSIS — Z881 Allergy status to other antibiotic agents status: Secondary | ICD-10-CM

## 2016-06-13 DIAGNOSIS — I739 Peripheral vascular disease, unspecified: Secondary | ICD-10-CM | POA: Diagnosis present

## 2016-06-13 DIAGNOSIS — R296 Repeated falls: Secondary | ICD-10-CM | POA: Diagnosis present

## 2016-06-13 DIAGNOSIS — G8929 Other chronic pain: Secondary | ICD-10-CM | POA: Diagnosis present

## 2016-06-13 DIAGNOSIS — Z888 Allergy status to other drugs, medicaments and biological substances status: Secondary | ICD-10-CM

## 2016-06-13 LAB — URINALYSIS, ROUTINE W REFLEX MICROSCOPIC
Bacteria, UA: NONE SEEN
Glucose, UA: NEGATIVE mg/dL
KETONES UR: NEGATIVE mg/dL
LEUKOCYTES UA: NEGATIVE
Nitrite: NEGATIVE
PH: 5 (ref 5.0–8.0)
Protein, ur: 30 mg/dL — AB
Specific Gravity, Urine: 1.021 (ref 1.005–1.030)

## 2016-06-13 LAB — CBC WITH DIFFERENTIAL/PLATELET
Basophils Absolute: 0 10*3/uL (ref 0.0–0.1)
Basophils Relative: 0 %
Eosinophils Absolute: 0 10*3/uL (ref 0.0–0.7)
Eosinophils Relative: 0 %
HEMATOCRIT: 38.9 % (ref 36.0–46.0)
HEMOGLOBIN: 12.5 g/dL (ref 12.0–15.0)
LYMPHS ABS: 0.9 10*3/uL (ref 0.7–4.0)
Lymphocytes Relative: 12 %
MCH: 29.6 pg (ref 26.0–34.0)
MCHC: 32.1 g/dL (ref 30.0–36.0)
MCV: 92 fL (ref 78.0–100.0)
MONOS PCT: 11 %
Monocytes Absolute: 0.9 10*3/uL (ref 0.1–1.0)
NEUTROS ABS: 6 10*3/uL (ref 1.7–7.7)
Neutrophils Relative %: 77 %
Platelets: 204 10*3/uL (ref 150–400)
RBC: 4.23 MIL/uL (ref 3.87–5.11)
RDW: 15.4 % (ref 11.5–15.5)
WBC: 7.8 10*3/uL (ref 4.0–10.5)

## 2016-06-13 LAB — COMPREHENSIVE METABOLIC PANEL
ALT: 26 U/L (ref 14–54)
ANION GAP: 10 (ref 5–15)
AST: 39 U/L (ref 15–41)
Albumin: 2.5 g/dL — ABNORMAL LOW (ref 3.5–5.0)
Alkaline Phosphatase: 173 U/L — ABNORMAL HIGH (ref 38–126)
BUN: 21 mg/dL — ABNORMAL HIGH (ref 6–20)
CALCIUM: 8.9 mg/dL (ref 8.9–10.3)
CO2: 25 mmol/L (ref 22–32)
Chloride: 102 mmol/L (ref 101–111)
Creatinine, Ser: 1.07 mg/dL — ABNORMAL HIGH (ref 0.44–1.00)
GFR calc Af Amer: 53 mL/min — ABNORMAL LOW (ref >60)
GFR, EST NON AFRICAN AMERICAN: 46 mL/min — AB (ref >60)
GLUCOSE: 112 mg/dL — AB (ref 65–99)
POTASSIUM: 3.4 mmol/L — AB (ref 3.5–5.1)
Sodium: 137 mmol/L (ref 135–145)
Total Bilirubin: 1 mg/dL (ref 0.3–1.2)
Total Protein: 6.1 g/dL — ABNORMAL LOW (ref 6.5–8.1)

## 2016-06-13 LAB — I-STAT CG4 LACTIC ACID, ED: Lactic Acid, Venous: 1.49 mmol/L (ref 0.5–1.9)

## 2016-06-13 LAB — PROTIME-INR
INR: 1.07
Prothrombin Time: 13.9 seconds (ref 11.4–15.2)

## 2016-06-13 MED ORDER — ENOXAPARIN SODIUM 40 MG/0.4ML ~~LOC~~ SOLN
40.0000 mg | SUBCUTANEOUS | Status: DC
  Administered 2016-06-13: 40 mg via SUBCUTANEOUS
  Filled 2016-06-13: qty 0.4

## 2016-06-13 MED ORDER — METOPROLOL SUCCINATE ER 25 MG PO TB24
25.0000 mg | ORAL_TABLET | Freq: Every day | ORAL | Status: DC
  Administered 2016-06-13: 25 mg via ORAL
  Filled 2016-06-13 (×2): qty 1

## 2016-06-13 MED ORDER — ONDANSETRON HCL 4 MG/2ML IJ SOLN
4.0000 mg | Freq: Four times a day (QID) | INTRAMUSCULAR | Status: DC | PRN
  Administered 2016-06-13: 4 mg via INTRAVENOUS
  Filled 2016-06-13: qty 2

## 2016-06-13 MED ORDER — ISOSORBIDE MONONITRATE ER 30 MG PO TB24
30.0000 mg | ORAL_TABLET | Freq: Every day | ORAL | Status: DC
  Administered 2016-06-14 – 2016-06-18 (×4): 30 mg via ORAL
  Filled 2016-06-13 (×5): qty 1

## 2016-06-13 MED ORDER — DEXTROSE 5 % IV SOLN
1.0000 g | INTRAVENOUS | Status: DC
  Administered 2016-06-14 – 2016-06-15 (×2): 1 g via INTRAVENOUS
  Filled 2016-06-13 (×3): qty 1

## 2016-06-13 MED ORDER — ALBUTEROL SULFATE (2.5 MG/3ML) 0.083% IN NEBU
2.5000 mg | INHALATION_SOLUTION | Freq: Once | RESPIRATORY_TRACT | Status: AC
  Administered 2016-06-13: 2.5 mg via RESPIRATORY_TRACT
  Filled 2016-06-13: qty 3

## 2016-06-13 MED ORDER — POLYVINYL ALCOHOL 1.4 % OP SOLN
1.0000 [drp] | Freq: Four times a day (QID) | OPHTHALMIC | Status: DC
  Administered 2016-06-13 – 2016-06-18 (×15): 1 [drp] via OPHTHALMIC
  Filled 2016-06-13: qty 15

## 2016-06-13 MED ORDER — AMLODIPINE BESYLATE 10 MG PO TABS
10.0000 mg | ORAL_TABLET | Freq: Every day | ORAL | Status: DC
  Administered 2016-06-14 – 2016-06-18 (×4): 10 mg via ORAL
  Filled 2016-06-13 (×5): qty 1

## 2016-06-13 MED ORDER — ACETAMINOPHEN 325 MG PO TABS
650.0000 mg | ORAL_TABLET | Freq: Once | ORAL | Status: AC
  Administered 2016-06-13: 650 mg via ORAL
  Filled 2016-06-13: qty 2

## 2016-06-13 MED ORDER — LEVALBUTEROL HCL 0.63 MG/3ML IN NEBU
0.6300 mg | INHALATION_SOLUTION | RESPIRATORY_TRACT | Status: DC | PRN
  Administered 2016-06-14 – 2016-06-15 (×3): 0.63 mg via RESPIRATORY_TRACT
  Filled 2016-06-13 (×4): qty 3

## 2016-06-13 MED ORDER — ONDANSETRON HCL 4 MG PO TABS: 4.0000 mg | ORAL_TABLET | Freq: Four times a day (QID) | ORAL | Status: DC | PRN

## 2016-06-13 MED ORDER — DEXTROSE 5 % IV SOLN
1.0000 g | Freq: Once | INTRAVENOUS | Status: AC
  Administered 2016-06-13: 1 g via INTRAVENOUS
  Filled 2016-06-13: qty 1

## 2016-06-13 MED ORDER — ASPIRIN EC 81 MG PO TBEC
81.0000 mg | DELAYED_RELEASE_TABLET | Freq: Every day | ORAL | Status: DC
  Administered 2016-06-14 – 2016-06-18 (×5): 81 mg via ORAL
  Filled 2016-06-13 (×5): qty 1

## 2016-06-13 MED ORDER — ACETAMINOPHEN 325 MG PO TABS
650.0000 mg | ORAL_TABLET | Freq: Four times a day (QID) | ORAL | Status: DC | PRN
  Administered 2016-06-13 – 2016-06-17 (×4): 650 mg via ORAL
  Filled 2016-06-13 (×4): qty 2

## 2016-06-13 MED ORDER — SODIUM CHLORIDE 0.9 % IV SOLN
INTRAVENOUS | Status: AC
  Administered 2016-06-13: 17:00:00 via INTRAVENOUS

## 2016-06-13 MED ORDER — VANCOMYCIN HCL 500 MG IV SOLR
500.0000 mg | INTRAVENOUS | Status: DC
  Administered 2016-06-13: 500 mg via INTRAVENOUS
  Filled 2016-06-13: qty 500

## 2016-06-13 MED ORDER — POLYETHYLENE GLYCOL 3350 17 G PO PACK
17.0000 g | PACK | Freq: Every day | ORAL | Status: DC | PRN
  Administered 2016-06-14 – 2016-06-15 (×2): 17 g via ORAL
  Filled 2016-06-13 (×2): qty 1

## 2016-06-13 MED ORDER — PREDNISOLONE 5 MG PO TABS
40.0000 mg | ORAL_TABLET | Freq: Every day | ORAL | Status: DC
  Filled 2016-06-13: qty 8

## 2016-06-13 MED ORDER — METHYLPREDNISOLONE SODIUM SUCC 125 MG IJ SOLR
125.0000 mg | Freq: Once | INTRAMUSCULAR | Status: AC
  Administered 2016-06-13: 125 mg via INTRAVENOUS
  Filled 2016-06-13: qty 2

## 2016-06-13 MED ORDER — TIOTROPIUM BROMIDE MONOHYDRATE 18 MCG IN CAPS
18.0000 ug | ORAL_CAPSULE | Freq: Every day | RESPIRATORY_TRACT | Status: DC
  Administered 2016-06-14 – 2016-06-18 (×5): 18 ug via RESPIRATORY_TRACT
  Filled 2016-06-13 (×2): qty 5

## 2016-06-13 MED ORDER — GUAIFENESIN ER 600 MG PO TB12
600.0000 mg | ORAL_TABLET | Freq: Two times a day (BID) | ORAL | Status: DC
  Administered 2016-06-13 – 2016-06-18 (×10): 600 mg via ORAL
  Filled 2016-06-13 (×10): qty 1

## 2016-06-13 MED ORDER — ACETAMINOPHEN 650 MG RE SUPP: 650.0000 mg | Freq: Four times a day (QID) | RECTAL | Status: DC | PRN

## 2016-06-13 NOTE — Progress Notes (Signed)
Admitted patient from E.D.Alert and oriented x 4 ,with oxygen at  2 lpm. Educated on fall prevention protocol,placed in bed comfortably with bed alarm on sensitive scale.Multiple skin bruised and skin tear noted.1.Granulating skin tear on her right lateral upper forearm 2.5.x 2. 2Granulating skin tear on her right lateral forehead 1x 1 3. Granulating skin tear on her left lateral forearm 1 x1 . 4. Granulating skin tear on her left lateral leg 2 x 3. 5.Healing scab wound on right ankle .5x.5 . 6.Healed wound in the inner lower fissure of her buttock .5 x.5. Skin assessed with Lupita Dawn.N.

## 2016-06-13 NOTE — ED Triage Notes (Signed)
Pt reports that she thinks she has pneumonia. Per visitors pt had fever and home and is not eating.

## 2016-06-13 NOTE — H&P (Addendum)
[facility] History and Physical  [name] [name] EYC:[id number] DOB: 1927-10-04 DOA: 06/13/2016  Referring physician:  PCP: Burnis Medin, MD   Chief Complaint: "All of a sudden I had trouble breathing."  HPI: [name] [name] is a 83 y.o. female  with past mental history significant for coronary artery disease hypertension hypothyroid peripheral vascular COPD disease presents to the hospital with chief complaint of shortness of breath. Patient acutely began to have short of breath today. She did not have any of her breathing medication at home. She'll let a neighbor know what was going on. Transport to the hospital was organized.  ED course: Patient given 2 breathing treatments with little improvement in her respiratory status. Still requiring oxygen. Hospitalist agreed to admit patient.  Review of Systems:  As per HPI otherwise 10 point review of systems negative.    Past Medical History:  Diagnosis Date  . CARCINOMA, BASAL CELL 02/13/2006  . CAROTID ARTERY DISEASE 10/03/2006   Carotid US 11/17: Stable 40-59% bilateral ICA stenosis >> f/u 1 year  . CHRONIC OBSTRUCTIVE PULMONARY DISEASE, ACUTE EXACERBATION 11/17/2009  . Chronic rhinitis 12/13/2006  . CORONARY ARTERY DISEASE 02/13/2006  . DEGENERATIVE JOINT DISEASE 09/27/2006  . DERMATITIS 09/25/2007  . Head trauma    closed  . HYPERLIPIDEMIA 03/10/2008  . HYPERTENSION 02/13/2006  . HYPOTHYROIDISM 01/30/2007  . LIVER FUNCTION TESTS, ABNORMAL 01/22/2009  . LOSS, HEARING NOS 08/25/2006  . Near syncope 09/18/2014  . NEOPLASM, SKIN, UNCERTAIN BEHAVIOR 14/97/0263  . NEPHROLITHIASIS, HX OF 02/27/2010  . OSTEOARTHRITIS, HAND 12/16/2008  . PERIPHERAL VASCULAR DISEASE 02/13/2006  . Personal history of malignant neoplasm of breast 07/24/2008  . Personal history of urinary calculi   . Post-traumatic wound infection 12/01/2010   Mild streaking superiorly and on one day of septra  Will give rocephin and add keflex  adn follow  up   . POSTHERPETIC NEURALGIA 08/25/2006  . RENAL ARTERY STENOSIS 08/01/2008  . Shingles    recurrent  . SPINAL STENOSIS 11/01/2006  . THROMBOCYTOPENIA 07/30/2008  . UNS ADVRS EFF UNS RX MEDICINAL&BIOLOGICAL SBSTNC 01/30/2007  . UNSPECIFIED ANEMIA 03/18/2008  . Unspecified vitamin D deficiency 02/07/2007  . UNSTABLE ANGINA 12/12/2009  . Vitreous detachment    Past Surgical History:  Procedure Laterality Date  . ABDOMINAL HYSTERECTOMY    . BREAST LUMPECTOMY    . BREAST LUMPECTOMY  1998  . CATARACT EXTRACTION    . CORONARY ANGIOPLASTY WITH STENT PLACEMENT     bilat iliac stents, left renal artery and rt coronary artery last myoview 8/09 with EF 70%  . LUMBAR LAMINECTOMY    . SKIN CANCER EXCISION    . TONSILLECTOMY     Social History:  reports that she quit smoking about 6 years ago. Her smoking use included Cigarettes. She has a 30.00 pack-year smoking history. She has never used smokeless tobacco. She reports that she does not drink alcohol or use drugs.  Allergies  Allergen Reactions  . Statins Other (See Comments)    Elevated liver enzymes  . Oxycodone Anxiety and Other (See Comments)    Also states, "It makes me feel like a zombie"  . Albuterol Other (See Comments)    Shakes   . Hydrocodone-Acetaminophen Nausea Only    Dizziness  . Tequin Other (See Comments)    Patient doesn't recall    Family History  Problem Relation Age of Onset  . Breast cancer Daughter   . Tuberculosis Father      Prior to Admission medications  Medication Sig Start Date End Date Taking? Authorizing Provider  acetaminophen (TYLENOL) 500 MG tablet Take 1 tablet (500 mg total) by mouth every 6 (six) hours as needed. Patient taking differently: Take 500 mg by mouth every 6 (six) hours as needed for moderate pain or headache.  04/05/16  Yes Rosita Fire, MD  amLODipine (NORVASC) 10 MG tablet Take 1 tablet (10 mg total) by mouth daily. 11/10/15  Yes Lelon Perla, MD  aspirin 81 MG tablet  Take 81 mg by mouth daily.   Yes [provider]  carboxymethylcellulose (REFRESH PLUS) 0.5 % SOLN Place 1 drop into both eyes 4 (four) times daily.   Yes [provider]  Cholecalciferol (VITAMIN D) 1000 UNITS capsule Take 1,000 Units by mouth daily.     Yes [provider]  fish oil-omega-3 fatty acids 1000 MG capsule Take 1 g by mouth daily.    Yes [provider]  isosorbide mononitrate (IMDUR) 30 MG 24 hr tablet Take 1 tablet (30 mg total) by mouth daily. 10/31/15  Yes Lelon Perla, MD  metoprolol succinate (TOPROL-XL) 25 MG 24 hr tablet Take 1 tablet (25 mg total) by mouth daily. 10/31/15  Yes Lelon Perla, MD  SPIRIVA HANDIHALER 18 MCG inhalation capsule INHALE THE CONTENTS OF 1 CAPSULE VIA INHALATION DEVICE EVERY DAY 12/24/15  Yes Rigoberto Noel, MD  traMADol (ULTRAM) 50 MG tablet Take 1 tablet (50 mg total) by mouth every 6 (six) hours as needed. Patient taking differently: Take 50 mg by mouth every 6 (six) hours as needed for moderate pain.  06/08/16  Yes Schlossman, Junie Panning, MD  levalbuterol (XOPENEX HFA) 45 MCG/ACT inhaler INHALE 2 PUFFS BY MOUTH EVERY 6 HOURS AS NEEDED FOR WHEEZING 10/15/15   Rigoberto Noel, MD  levalbuterol Penne Lash) 0.63 MG/3ML nebulizer solution Take 0.63 mg by nebulization every 4 (four) hours as needed for wheezing or shortness of breath.    [provider]   Physical Exam: Vitals:   06/13/16 1412 06/13/16 1430 06/13/16 1445 06/13/16 1458  BP:  (!) 147/63 (!) 126/53   Pulse:  65 64   Resp:  (!) 28 (!) 29   Temp:      SpO2:  97% 98% (!) 89%  Weight: 42.6 kg (94 lb)       Wt Readings from Last 3 Encounters:  06/13/16 42.6 kg (94 lb)  04/20/16 43.3 kg (95 lb 6.4 oz)  04/02/16 47.6 kg (104 lb 14.4 oz)    General:  Appears calm and comfortable, Alert and oriented 3 Eyes:  PERRL, EOMI, normal lids, iris ENT:  grossly normal hearing, lips & tongue Neck:  no LAD, masses or thyromegaly Cardiovascular:  RRR, no  m/r/g. No LE edema.  Respiratory:  Respiratory distress, tachypnea, diffuse wheezing, use recession muscles Abdomen:  soft, ntnd Skin:  no rash or induration seen on limited exam, Steri-Strips on forehead on right Musculoskeletal:  grossly normal tone BUE/BLE, swelling of right shoulder (due to recent injury) Psychiatric:  grossly normal mood and affect, speech fluent and appropriate Neurologic:  CN 2-12 grossly intact, moves all extremities in coordinated fashion.          Labs on Admission:  Basic Metabolic Panel:  Recent Labs Lab 06/13/16 1223  NA 137  K 3.4*  CL 102  CO2 25  GLUCOSE 112*  BUN 21*  CREATININE 1.07*  CALCIUM 8.9   Liver Function Tests:  Recent Labs Lab 06/13/16 1223  AST 39  ALT 26  ALKPHOS 173*  BILITOT 1.0  PROT 6.1*  ALBUMIN 2.5*   No results for input(s): LIPASE, AMYLASE in the last 168 hours. No results for input(s): AMMONIA in the last 168 hours. CBC:  Recent Labs Lab 06/13/16 1223  WBC 7.8  NEUTROABS 6.0  HGB 12.5  HCT 38.9  MCV 92.0  PLT 204   Cardiac Enzymes: No results for input(s): CKTOTAL, CKMB, CKMBINDEX, TROPONINI in the last 168 hours.  BNP (last 3 results)  Recent Labs  11/12/15 1205  BNP 251.9*    ProBNP (last 3 results) No results for input(s): PROBNP in the last 8760 hours.   Serum creatinine: 1.07 mg/dL (H) 06/13/16 1223 Estimated creatinine clearance: 25.4 mL/min (A)  CBG: No results for input(s): GLUCAP in the last 168 hours.  Radiological Exams on Admission: Dg Chest Portable 1 View  Result Date: 06/13/2016 CLINICAL DATA:  Fever, decreased p.o. intake.  Pneumonia? EXAM: PORTABLE CHEST 1 VIEW COMPARISON:  Chest x-rays dated 06/08/2016, 04/01/2016 and 11/12/2015. FINDINGS: New ill-defined airspace opacity at the right lung base, highly suggestive of pneumonia. Lungs otherwise clear. No pleural effusion or pneumothorax seen. Heart size is stable. Atherosclerotic changes noted at the aortic arch.  IMPRESSION: Right lower lobe pneumonia. Aortic atherosclerosis. Electronically Signed   By: Franki Cabot M.D.   On: 06/13/2016 13:45    EKG: Independently reviewed. No STEMI.  Assessment/Plan Active Problems:   Respiratory failure with hypoxia (HCC)  Acute respiratory failure Right middle lobe pneumonia w/ COPD component Scheduled DuoNeb's When necessary albuterol Antibiotic-vancomycin and cefepime given the emergency room, will continue cefepime only Oxygen therapy Continuous pulse oximetry Sputum culture Mucinex Flutter valve Incentive spirometer Respiratory therapy consult  Hypertension When necessary hydralazine 10 mg IV as needed for severe blood pressure Cont norvasc, toprol-xl  CAD Cont asa 81mg , Imdur  COPD QD spiriva Prn xopenex  Chronic pain Hold tramadol  Code Status: DNR DVT Prophylaxis: lovenox Family Communication: neighbors at bedside Disposition Plan: Pending Improvement  Status: tele obs  Elwin Mocha, MD Family Medicine [facility] [email or web address] Password [email or web address]

## 2016-06-13 NOTE — ED Provider Notes (Signed)
[facility] DEPT Provider Note   CSN: [id number] Arrival date & time: 06/13/16  1210     History   Chief Complaint Chief Complaint  Patient presents with  . Fever    HPI [name] [name] is a 83 y.o. female.  HPI  83 y.o. female with a hx of HTN, HLD, CAD, COPD, presents to the Emergency Department today complaining of fever at home. Tmax 101F yesterday. Has been struggling with URI symptoms consisting of rhinorrhea, sinus congestion. Notes productive cough since Wednesday with decreased appetite and weakness. No N/V/D. No chest pain. Notes shortness of breath. No abdominal pain. No dysuria. No meds PTA. No recent sick contacts. Noted recent hospitalization in March. No other symptoms noted.   Past Medical History:  Diagnosis Date  . CARCINOMA, BASAL CELL 02/13/2006  . CAROTID ARTERY DISEASE 10/03/2006   Carotid US 11/17: Stable 40-59% bilateral ICA stenosis >> f/u 1 year  . CHRONIC OBSTRUCTIVE PULMONARY DISEASE, ACUTE EXACERBATION 11/17/2009  . Chronic rhinitis 12/13/2006  . CORONARY ARTERY DISEASE 02/13/2006  . DEGENERATIVE JOINT DISEASE 09/27/2006  . DERMATITIS 09/25/2007  . Head trauma    closed  . HYPERLIPIDEMIA 03/10/2008  . HYPERTENSION 02/13/2006  . HYPOTHYROIDISM 01/30/2007  . LIVER FUNCTION TESTS, ABNORMAL 01/22/2009  . LOSS, HEARING NOS 08/25/2006  . Near syncope 09/18/2014  . NEOPLASM, SKIN, UNCERTAIN BEHAVIOR 71/69/6789  . NEPHROLITHIASIS, HX OF 02/27/2010  . OSTEOARTHRITIS, HAND 12/16/2008  . PERIPHERAL VASCULAR DISEASE 02/13/2006  . Personal history of malignant neoplasm of breast 07/24/2008  . Personal history of urinary calculi   . Post-traumatic wound infection 12/01/2010   Mild streaking superiorly and on one day of septra  Will give rocephin and add keflex  adn follow up   . POSTHERPETIC NEURALGIA 08/25/2006  . RENAL ARTERY STENOSIS 08/01/2008  . Shingles    recurrent  . SPINAL STENOSIS 11/01/2006  . THROMBOCYTOPENIA 07/30/2008  . UNS ADVRS EFF UNS RX  MEDICINAL&BIOLOGICAL SBSTNC 01/30/2007  . UNSPECIFIED ANEMIA 03/18/2008  . Unspecified vitamin D deficiency 02/07/2007  . UNSTABLE ANGINA 12/12/2009  . Vitreous detachment     Patient Active Problem List   Diagnosis Date Noted  . Multiple closed pelvic fractures without disruption of pelvic circle ([place]) 04/03/2016  . Multiple pelvic fractures 04/01/2016  . Fall 04/01/2016  . CAP (community acquired pneumonia) 12/19/2015  . Advanced age 11/27/2015  . Protein-calorie malnutrition ([place]) 11/27/2015  . Frailty 11/27/2015  . Protein-calorie malnutrition, severe 11/15/2015  . COPD exacerbation ([place]) 11/12/2015  . Acute respiratory failure with hypoxia ([place]) 11/12/2015  . CAD (coronary artery disease), native coronary artery 11/12/2015  . Statin intolerance 10/11/2014  . Headache disorder 09/18/2014  . Laceration of right lower leg with complication 38/11/1749  . Post-traumatic wound infection 08/27/2013  . Rash and nonspecific skin eruption 11/09/2012  . Pain of right breast 08/15/2012  . Urinary frequency 08/15/2012  . HX: breast cancer 08/15/2012  . TOBACCO USE 04/21/2012  . Arthritis 05/31/2011  . Pain of left heel 05/31/2011  . Infected cyst of skin 12/29/2010  . Leg cramps 12/12/2010  . Glossitis 11/26/2010  . Thrombocytopenia ([place]) 07/21/2010  . Traumatic ecchymosis of lower leg 07/21/2010  . Atelectasis 07/16/2010  . Tinnitus 06/04/2010  . NEPHROLITHIASIS, HX OF 02/27/2010  . HIP PAIN 02/26/2010  . FEMORAL BRUIT, RIGHT 01/02/2010  . UNSTABLE ANGINA 12/12/2009  . Essential hypertension, benign 06/03/2009  . LIVER FUNCTION TESTS, ABNORMAL 01/22/2009  . NEOPLASM, SKIN, UNCERTAIN BEHAVIOR 02/58/5277  . OSTEOARTHRITIS, HAND 12/16/2008  . UNSPECIFIED  DISORDER OF KIDNEY AND URETER 09/17/2008  . RENAL ARTERY STENOSIS 08/01/2008  . THROMBOCYTOPENIA 07/30/2008  . PERSONAL HISTORY OF MALIGNANT NEOPLASM OF BREAST 07/24/2008  . WEIGHT LOSS 06/11/2008  . GROIN PAIN 06/11/2008  .  INSOMNIA 04/05/2008  . UNSPECIFIED ANEMIA 03/18/2008  . HYPERLIPIDEMIA 03/10/2008  . UNDERWEIGHT 01/17/2008  . DERMATITIS 09/25/2007  . UNSPECIFIED VITAMIN D DEFICIENCY 02/07/2007  . HYPOTHYROIDISM 01/30/2007  . CHRONIC RHINITIS 12/13/2006  . COPD (chronic obstructive pulmonary disease) ([place]) 11/21/2006  . SPINAL STENOSIS 11/01/2006  . Carotid arterial disease ([place]) 10/03/2006  . DEGENERATIVE JOINT DISEASE 09/27/2006  . LOSS, HEARING NOS 08/25/2006  . TMJ PAIN 08/25/2006  . CARCINOMA, BASAL CELL 02/13/2006  . Coronary atherosclerosis 02/13/2006  . Peripheral vascular disease ([place]) 02/13/2006  . HEMATURIA, MICROSCOPIC, HX OF 02/13/2006    Past Surgical History:  Procedure Laterality Date  . ABDOMINAL HYSTERECTOMY    . BREAST LUMPECTOMY    . BREAST LUMPECTOMY  1998  . CATARACT EXTRACTION    . CORONARY ANGIOPLASTY WITH STENT PLACEMENT     bilat iliac stents, left renal artery and rt coronary artery last myoview 8/09 with EF 70%  . LUMBAR LAMINECTOMY    . SKIN CANCER EXCISION    . TONSILLECTOMY      OB History    No data available       Home Medications    Prior to Admission medications   Medication Sig Start Date End Date Taking? Authorizing Provider  acetaminophen (TYLENOL) 500 MG tablet Take 1 tablet (500 mg total) by mouth every 6 (six) hours as needed. 04/05/16   Rosita Fire, MD  amLODipine (NORVASC) 10 MG tablet Take 1 tablet (10 mg total) by mouth daily. 11/10/15   Lelon Perla, MD  aspirin 81 MG tablet Take 81 mg by mouth daily.    [provider]  carboxymethylcellulose (REFRESH PLUS) 0.5 % SOLN Place 1 drop into both eyes 4 (four) times daily.    [provider]  Cholecalciferol (VITAMIN D) 1000 UNITS capsule Take 1,000 Units by mouth daily.      [provider]  fish oil-omega-3 fatty acids 1000 MG capsule Take 1 g by mouth daily.     [provider]  isosorbide mononitrate (IMDUR) 30 MG 24 hr tablet Take 1 tablet  (30 mg total) by mouth daily. 10/31/15   Lelon Perla, MD  levalbuterol (XOPENEX HFA) 45 MCG/ACT inhaler INHALE 2 PUFFS BY MOUTH EVERY 6 HOURS AS NEEDED FOR WHEEZING 10/15/15   Rigoberto Noel, MD  levalbuterol Penne Lash) 0.63 MG/3ML nebulizer solution Take 0.63 mg by nebulization every 4 (four) hours as needed for wheezing or shortness of breath.    [provider]  metoprolol succinate (TOPROL-XL) 25 MG 24 hr tablet Take 1 tablet (25 mg total) by mouth daily. 10/31/15   Lelon Perla, MD  SPIRIVA HANDIHALER 18 MCG inhalation capsule INHALE THE CONTENTS OF 1 CAPSULE VIA INHALATION DEVICE EVERY DAY 12/24/15   Rigoberto Noel, MD  traMADol (ULTRAM) 50 MG tablet Take 1 tablet (50 mg total) by mouth every 6 (six) hours as needed. 06/08/16   Gareth Morgan, MD    Family History Family History  Problem Relation Age of Onset  . Breast cancer Daughter   . Tuberculosis Father     Social History Social History  Substance Use Topics  . Smoking status: Former Smoker    Packs/day: 1.00    Years: 30.00    Types: Cigarettes  Quit date: 06/15/2010  . Smokeless tobacco: Never Used     Comment: 2-3 cigs per week/// 06/30/12  . Alcohol use No     Comment: socially     Allergies   Statins; Oxycodone; Albuterol; Hydrocodone-acetaminophen; and Tequin   Review of Systems Review of Systems ROS reviewed and all are negative for acute change except as noted in the HPI.  Physical Exam Updated Vital Signs BP (!) 110/43   Pulse 69   Temp 99 F (37.2 C)   Resp (!) 22   SpO2 90%   Physical Exam  Constitutional: She is oriented to person, place, and time. Vital signs are normal. She appears well-developed and well-nourished.  Appears Sickly. Increased work of breathing  HENT:  Head: Normocephalic and atraumatic.  Right Ear: Hearing normal.  Left Ear: Hearing normal.  Eyes: Conjunctivae and EOM are normal. Pupils are equal, round, and reactive to light.  Neck: Normal range of  motion. Neck supple.  Cardiovascular: Normal rate, regular rhythm, normal heart sounds and intact distal pulses.   Pulmonary/Chest: Accessory muscle usage present. Tachypnea noted. No respiratory distress. She has wheezes in the right upper field, the right lower field, the left upper field and the left lower field. She has rhonchi in the right upper field and the right lower field.  Coughing with productive sputum  Abdominal: Soft. There is no tenderness.  Musculoskeletal: Normal range of motion.  Neurological: She is alert and oriented to person, place, and time.  Skin: Skin is warm and dry.  Psychiatric: She has a normal mood and affect. Her speech is normal and behavior is normal. Thought content normal.  Nursing note and vitals reviewed.  ED Treatments / Results  Labs (all labs ordered are listed, but only abnormal results are displayed) Labs Reviewed  COMPREHENSIVE METABOLIC PANEL - Abnormal; Notable for the following:       Result Value   Potassium 3.4 (*)    Glucose, Bld 112 (*)    BUN 21 (*)    Creatinine, Ser 1.07 (*)    Total Protein 6.1 (*)    Albumin 2.5 (*)    Alkaline Phosphatase 173 (*)    GFR calc non Af Amer 46 (*)    GFR calc Af Amer 53 (*)    All other components within normal limits  CULTURE, BLOOD (ROUTINE X 2)  CULTURE, BLOOD (ROUTINE X 2)  CBC WITH DIFFERENTIAL/PLATELET  PROTIME-INR  URINALYSIS, ROUTINE W REFLEX MICROSCOPIC  I-STAT CG4 LACTIC ACID, ED  I-STAT CG4 LACTIC ACID, ED    EKG  EKG Interpretation  Date/Time:  Sunday Jun 13 2016 12:45:47 EDT Ventricular Rate:  64 PR Interval:    QRS Duration: 79 QT Interval:  432 QTC Calculation: 446 R Axis:   -57 Text Interpretation:  Sinus rhythm Inferior infarct, old Anterior infarct, old no acute changes  Confirmed by LIU MD, DANA [phone]) on 06/13/2016 1:52:07 PM       Radiology Dg Chest Portable 1 View  Result Date: 06/13/2016 CLINICAL DATA:  Fever, decreased p.o. intake.  Pneumonia? EXAM:  PORTABLE CHEST 1 VIEW COMPARISON:  Chest x-rays dated 06/08/2016, 04/01/2016 and 11/12/2015. FINDINGS: New ill-defined airspace opacity at the right lung base, highly suggestive of pneumonia. Lungs otherwise clear. No pleural effusion or pneumothorax seen. Heart size is stable. Atherosclerotic changes noted at the aortic arch. IMPRESSION: Right lower lobe pneumonia. Aortic atherosclerosis. Electronically Signed   By: Franki Cabot M.D.   On: 06/13/2016 13:45    Procedures Procedures (  including critical care time)  Medications Ordered in ED Medications  ceFEPIme (MAXIPIME) 1 g in dextrose 5 % 50 mL IVPB (not administered)   Initial Impression / Assessment and Plan / ED Course  I have reviewed the triage vital signs and the nursing notes.  Pertinent labs & imaging results that were available during my care of the patient were reviewed by me and considered in my medical decision making (see chart for details).  Final Clinical Impressions(s) / ED Diagnoses  {I have reviewed and evaluated the relevant laboratory values. {I have reviewed and evaluated the relevant imaging studies. {I have interpreted the relevant EKG. {I have reviewed the relevant previous healthcare records.  {I obtained HPI from historian. {Patient discussed with supervising physician.  ED Course:  Assessment: Pt is a 83 y.o. female with hx HTN, HLD, CAD, COPD who presents with fever since yesterday. URI symptoms since Wednesday. On exam, pt appears sickly, uncomfortable with increased work of breathing. VSS, but does show oxygen requirement due to 88% on RA. Placed 2L [place] with O2 sat in low 90s. Temp 62F. Lungs with bilateral wheeze and rhonchi RLL. Productive sputum produced. Heart RRR. Abdomen nontender soft. iStat lactic 1.49. CBC without leukocytosis. BMP unremarkable. Blood cultures drawn. CXR shows RLL Pneumonia. HCAP due to hospitalization in March. Given Vanc/Cefepime in ED. Given x 2 albuterol treatments with continued  wheeze and work of breathing. Plan is to Admit to medicine due to likely failed outpatient ABX therapy and new oxygen requirement 2/2 pneumonia.   Disposition/Plan:  Admit Pt acknowledges and agrees with plan  Supervising Physician Forde Dandy, MD  Final diagnoses:  HCAP (healthcare-associated pneumonia)    New Prescriptions New Prescriptions   No medications on file     Shary Decamp, Hershal Coria 06/13/16 1504    Forde Dandy, MD 06/13/16 [phone]

## 2016-06-13 NOTE — ED Notes (Signed)
Called the pt's name x2.  No response.

## 2016-06-13 NOTE — Progress Notes (Signed)
Pharmacy Antibiotic Note  [name] [name] is a 83 y.o. female admitted on 06/13/2016 with pneumonia.  Pharmacy has been consulted for Vancomycin dosing. Cefepime 1g IV x1 ordered by ED MD.   SCr 1.07, CrCl ~ 25 mL/min. Wt 42.6 kg WBC within normal limits, ANC 6. Tmax 99.  Lactic acid 1.49.   Plan: Vancomycin 500mg  IV every 24 hours.  Goal trough 15-20 mcg/mL.  Follow-up renal function, clinical status, and culture results.  Follow-up for continuation orders for Cefepime    Temp (24hrs), Avg:99 F (37.2 C), Min:99 F (37.2 C), Max:99 F (37.2 C)   Recent Labs Lab 06/13/16 1223 06/13/16 1240  WBC 7.8  --   CREATININE 1.07*  --   LATICACIDVEN  --  1.49    CrCl cannot be calculated (Unknown ideal weight.).    Allergies  Allergen Reactions  . Statins Other (See Comments)    Elevated liver enzymes  . Oxycodone Anxiety    Also states, "It makes me feel like a zombie"  . Albuterol     Shakes   . Hydrocodone-Acetaminophen Nausea Only    Dizziness  . Tequin Other (See Comments)    Patient doesn't recall    Antimicrobials this admission: Vancomycin 5/13 >> Cefepime 5/13 x1  Dose adjustments this admission:   Microbiology results: 5/13 BCx:    Thank you for allowing pharmacy to be a part of this patient's care.  Sloan Leiter, PharmD, BCPS Clinical Pharmacist Clinical phone 06/13/2016 until 3:30 PM- [phone] After hours, please call [phone] 06/13/2016 1:56 PM

## 2016-06-14 ENCOUNTER — Encounter (HOSPITAL_COMMUNITY): Payer: Self-pay | Admitting: *Deleted

## 2016-06-14 DIAGNOSIS — M4800 Spinal stenosis, site unspecified: Secondary | ICD-10-CM | POA: Diagnosis present

## 2016-06-14 DIAGNOSIS — R05 Cough: Secondary | ICD-10-CM | POA: Diagnosis not present

## 2016-06-14 DIAGNOSIS — J9601 Acute respiratory failure with hypoxia: Secondary | ICD-10-CM | POA: Diagnosis not present

## 2016-06-14 DIAGNOSIS — H9190 Unspecified hearing loss, unspecified ear: Secondary | ICD-10-CM | POA: Diagnosis present

## 2016-06-14 DIAGNOSIS — J189 Pneumonia, unspecified organism: Secondary | ICD-10-CM | POA: Diagnosis not present

## 2016-06-14 DIAGNOSIS — J440 Chronic obstructive pulmonary disease with acute lower respiratory infection: Secondary | ICD-10-CM | POA: Diagnosis not present

## 2016-06-14 DIAGNOSIS — I739 Peripheral vascular disease, unspecified: Secondary | ICD-10-CM | POA: Diagnosis not present

## 2016-06-14 DIAGNOSIS — E039 Hypothyroidism, unspecified: Secondary | ICD-10-CM | POA: Diagnosis present

## 2016-06-14 DIAGNOSIS — R296 Repeated falls: Secondary | ICD-10-CM | POA: Diagnosis present

## 2016-06-14 DIAGNOSIS — S42026S Nondisplaced fracture of shaft of unspecified clavicle, sequela: Secondary | ICD-10-CM | POA: Diagnosis not present

## 2016-06-14 DIAGNOSIS — G8929 Other chronic pain: Secondary | ICD-10-CM | POA: Diagnosis present

## 2016-06-14 DIAGNOSIS — J441 Chronic obstructive pulmonary disease with (acute) exacerbation: Secondary | ICD-10-CM | POA: Diagnosis not present

## 2016-06-14 DIAGNOSIS — I10 Essential (primary) hypertension: Secondary | ICD-10-CM | POA: Diagnosis present

## 2016-06-14 DIAGNOSIS — J181 Lobar pneumonia, unspecified organism: Secondary | ICD-10-CM

## 2016-06-14 DIAGNOSIS — S42031A Displaced fracture of lateral end of right clavicle, initial encounter for closed fracture: Secondary | ICD-10-CM | POA: Diagnosis present

## 2016-06-14 DIAGNOSIS — I6529 Occlusion and stenosis of unspecified carotid artery: Secondary | ICD-10-CM | POA: Diagnosis not present

## 2016-06-14 DIAGNOSIS — I2510 Atherosclerotic heart disease of native coronary artery without angina pectoris: Secondary | ICD-10-CM | POA: Diagnosis not present

## 2016-06-14 DIAGNOSIS — Z7982 Long term (current) use of aspirin: Secondary | ICD-10-CM | POA: Diagnosis not present

## 2016-06-14 DIAGNOSIS — D649 Anemia, unspecified: Secondary | ICD-10-CM | POA: Diagnosis not present

## 2016-06-14 DIAGNOSIS — J310 Chronic rhinitis: Secondary | ICD-10-CM | POA: Diagnosis present

## 2016-06-14 DIAGNOSIS — E559 Vitamin D deficiency, unspecified: Secondary | ICD-10-CM | POA: Diagnosis present

## 2016-06-14 DIAGNOSIS — J9691 Respiratory failure, unspecified with hypoxia: Secondary | ICD-10-CM | POA: Diagnosis not present

## 2016-06-14 DIAGNOSIS — R0602 Shortness of breath: Secondary | ICD-10-CM | POA: Diagnosis not present

## 2016-06-14 DIAGNOSIS — Z66 Do not resuscitate: Secondary | ICD-10-CM | POA: Diagnosis not present

## 2016-06-14 DIAGNOSIS — Y95 Nosocomial condition: Secondary | ICD-10-CM | POA: Diagnosis not present

## 2016-06-14 DIAGNOSIS — Z87820 Personal history of traumatic brain injury: Secondary | ICD-10-CM | POA: Diagnosis not present

## 2016-06-14 DIAGNOSIS — M1990 Unspecified osteoarthritis, unspecified site: Secondary | ICD-10-CM | POA: Diagnosis present

## 2016-06-14 DIAGNOSIS — E785 Hyperlipidemia, unspecified: Secondary | ICD-10-CM | POA: Diagnosis present

## 2016-06-14 DIAGNOSIS — Z955 Presence of coronary angioplasty implant and graft: Secondary | ICD-10-CM | POA: Diagnosis not present

## 2016-06-14 DIAGNOSIS — M19049 Primary osteoarthritis, unspecified hand: Secondary | ICD-10-CM | POA: Diagnosis present

## 2016-06-14 LAB — BASIC METABOLIC PANEL
ANION GAP: 12 (ref 5–15)
BUN: 26 mg/dL — ABNORMAL HIGH (ref 6–20)
CO2: 22 mmol/L (ref 22–32)
Calcium: 8.4 mg/dL — ABNORMAL LOW (ref 8.9–10.3)
Chloride: 106 mmol/L (ref 101–111)
Creatinine, Ser: 1.05 mg/dL — ABNORMAL HIGH (ref 0.44–1.00)
GFR calc Af Amer: 54 mL/min — ABNORMAL LOW (ref >60)
GFR, EST NON AFRICAN AMERICAN: 47 mL/min — AB (ref >60)
Glucose, Bld: 174 mg/dL — ABNORMAL HIGH (ref 65–99)
POTASSIUM: 4.9 mmol/L (ref 3.5–5.1)
SODIUM: 140 mmol/L (ref 135–145)

## 2016-06-14 LAB — MRSA PCR SCREENING: MRSA by PCR: NEGATIVE

## 2016-06-14 LAB — CBC
HEMATOCRIT: 40.8 % (ref 36.0–46.0)
HEMOGLOBIN: 13 g/dL (ref 12.0–15.0)
MCH: 29.5 pg (ref 26.0–34.0)
MCHC: 31.9 g/dL (ref 30.0–36.0)
MCV: 92.5 fL (ref 78.0–100.0)
Platelets: 183 10*3/uL (ref 150–400)
RBC: 4.41 MIL/uL (ref 3.87–5.11)
RDW: 15.4 % (ref 11.5–15.5)
WBC: 7.4 10*3/uL (ref 4.0–10.5)

## 2016-06-14 MED ORDER — PREDNISONE 20 MG PO TABS
40.0000 mg | ORAL_TABLET | Freq: Every day | ORAL | Status: DC
  Administered 2016-06-14 – 2016-06-17 (×4): 40 mg via ORAL
  Filled 2016-06-14 (×4): qty 2

## 2016-06-14 MED ORDER — METOPROLOL SUCCINATE ER 25 MG PO TB24
25.0000 mg | ORAL_TABLET | Freq: Every day | ORAL | Status: DC
  Administered 2016-06-15 – 2016-06-18 (×2): 25 mg via ORAL
  Filled 2016-06-14 (×4): qty 1

## 2016-06-14 MED ORDER — ENOXAPARIN SODIUM 30 MG/0.3ML ~~LOC~~ SOLN
30.0000 mg | SUBCUTANEOUS | Status: DC
  Administered 2016-06-14 – 2016-06-17 (×4): 30 mg via SUBCUTANEOUS
  Filled 2016-06-14 (×4): qty 0.3

## 2016-06-14 NOTE — Progress Notes (Signed)
PT Cancellation Note  Patient Details Name: [name] MRN: [id number] DOB: 04-Nov-1927   Cancelled Treatment:    Reason Eval/Treat Not Completed: Other (comment)   Politely declining PT as she had just gotten comfortable;   Will follow up later today as time allows;  Otherwise, will follow up for PT tomorrow;   Thank you,  Roney Marion, PT  Acute Rehabilitation Services Pager [phone] Office [place] 06/14/2016, 12:39 PM

## 2016-06-14 NOTE — Progress Notes (Signed)
Altered MD Vann of HR 51 and lopressor 25mg PO due.  Awaiting response   Paulla Fore, RN

## 2016-06-14 NOTE — Clinical Social Work Note (Signed)
Clinical Social Work Assessment  Patient Details  Name: [name] [name] MRN: [id number] Date of Birth: 1927-11-22  Date of referral:  06/14/16               Reason for consult:  Facility Placement                Permission sought to share information with:    Permission granted to share information::     Name::     [name] [name]   Agency::     Relationship::  other  Contact Information:  [phone]   Housing/Transportation Living arrangements for the past 2 months:  Single Family Home Source of Information:  Patient Patient Interpreter Needed:  None Criminal Activity/Legal Involvement Pertinent to Current Situation/Hospitalization:  No - Comment as needed Significant Relationships:  Neighbor Lives with:  Self Do you feel safe going back to the place where you live?  Yes Need for family participation in patient care:  Yes (Comment) (receives assistance from neighbor)  Care giving concerns:  CSW received consult for possible SNF placement at time of discharge. Patient lives alone and family lives out of state. Patient receives assistance with some chores from neighbor, but neighbor undergoes dialysis 3x/week and is not available for extended periods to assist. Patient has had several falls and medical team concerned about safety at home.   Social Worker assessment / plan: CSW spoke with patient regarding possibility of rehab at SNF or option of PT at home. Patient requires PT evaluation. CSW will follow up with patient to discuss PT recommendations.  Employment status:  Retired Nurse, adult PT Recommendations:  Not assessed at this time Information / Referral to community resources:     Patient/Family's Response to care:  Patient was not amenable to SNF placement. Patient indicated she has previously received PT at home and would prefer home PT again.   Patient/Family's Understanding of and Emotional Response to Diagnosis, Current Treatment,  and Prognosis: Patient indicated she fell at home because of a mistake when taking things out of her shed, and stated she can manage well at home with help form her neighbor. Patient values independence and is hopeful she will recover well with PT at home.  Emotional Assessment Appearance:  Appears stated age Attitude/Demeanor/Rapport:  Other (appropriate) Affect (typically observed):  Pleasant, Calm, Appropriate Orientation:  Oriented to Self, Oriented to Place, Oriented to  Time, Oriented to Situation Alcohol / Substance use:  Not Applicable Psych involvement (Current and /or in the community):  No (Comment)  Discharge Needs  Concerns to be addressed:  Care Coordination, Discharge Planning Concerns Readmission within the last 30 days:  No Current discharge risk:  Lives alone Barriers to Discharge:  Continued Medical Work up   [name] [name], [place] 06/14/2016, 11:06 AM

## 2016-06-14 NOTE — Progress Notes (Addendum)
Pt states she can not breath. Checked O2 saturation = 98% on 2 liters of O2. Pt is very Congested. Administered breathing tx,    Pt at rest on RA O2 saturation 92%.  Pt states she would like to walk later today.      O2 saturation on RA 92%   Pt ambulated to doorway O2 dropped to 83-88% while on 3 Liters of O2.  Pt is coughing up green sputum.    Paulla Fore, RN

## 2016-06-14 NOTE — Progress Notes (Signed)
PROGRESS NOTE    [name] [name]  SWN:[id number] DOB: 1927/11/17 DOA: 06/13/2016 PCP: Burnis Medin, MD   Outpatient Specialists:     Brief Narrative:  [name] [name] is a 83 y.o. female  with past mental history significant for coronary artery disease hypertension hypothyroid peripheral vascular COPD disease presents to the hospital with chief complaint of shortness of breath. Patient acutely began to have short of breath today. She did not have any of her breathing medication at home. She'll let a neighbor know what was going on. Transport to the hospital was organized.   Assessment & Plan:   Active Problems:   Respiratory failure with hypoxia (HCC)   Lobar pneumonia (HCC)   Acute respiratory failure due to right middle lobe pneumonia (recent hospitalization) w/ COPD component Scheduled DuoNeb's When necessary albuterol Antibiotic-vancomycin and cefepime given the emergency room, will continue cefepime only -MRSA swab Oxygen therapy- wean as tolerated Sputum culture Mucinex Flutter valve Incentive spirometer -PO prednisone   Hypertension When necessary hydralazine 10 mg IV as needed for severe blood pressure Cont home meds: norvasc, toprol-xl  CAD Cont asa 81mg , Imdur  COPD QD spiriva Prn xopenex  Chronic pain Hold tramadol  Recent clavicular fracture -PT eval  DVT prophylaxis:  Lovenox  Code Status: DNR   Family Communication:   Disposition Plan:     Consultants:    Subjective: Still with some SOB-- lower back hurts from a "callus" per patient  Objective: Vitals:   06/14/16 0416 06/14/16 0906 06/14/16 0948 06/14/16 1000  BP: (!) 117/51  (!) 126/36 (!) 128/51  Pulse: (!) 48  (!) 51 (!) 51  Resp: 20  17   Temp: 97.6 F (36.4 C)  97.4 F (36.3 C)   TempSrc: Oral  Oral   SpO2: 98% 98% 100%   Weight:        Intake/Output Summary (Last 24 hours) at 06/14/16 1126 Last data filed at 06/14/16 0900  Gross per 24 hour    Intake              410 ml  Output                0 ml  Net              410 ml   Filed Weights   06/13/16 1412 06/13/16 2031  Weight: 42.6 kg (94 lb) 44.2 kg (97 lb 8 oz)    Examination:  General exam: uncomfortable appearing Respiratory system: diffuse wheezing, rhales Cardiovascular system: S1 & S2 heard, RRR. No JVD, murmurs, rubs, gallops or clicks. No pedal edema. Gastrointestinal system: Abdomen is nondistended, soft and nontender. No organomegaly or masses felt. Normal bowel sounds heard. Central nervous system: Alert and oriented. No focal neurological deficits. Psychiatry: Judgement and insight appear normal. Mood & affect appropriate.     Data Reviewed: I have personally reviewed following labs and imaging studies  CBC:  Recent Labs Lab 06/13/16 1223 06/14/16 0539  WBC 7.8 7.4  NEUTROABS 6.0  --   HGB 12.5 13.0  HCT 38.9 40.8  MCV 92.0 92.5  PLT 204 938   Basic Metabolic Panel:  Recent Labs Lab 06/13/16 1223 06/14/16 0539  NA 137 140  K 3.4* 4.9  CL 102 106  CO2 25 22  GLUCOSE 112* 174*  BUN 21* 26*  CREATININE 1.07* 1.05*  CALCIUM 8.9 8.4*   GFR: Estimated Creatinine Clearance: 26.8 mL/min (A) (by C-G formula based on SCr of 1.05 mg/dL (H)). Liver  Function Tests:  Recent Labs Lab 06/13/16 1223  AST 39  ALT 26  ALKPHOS 173*  BILITOT 1.0  PROT 6.1*  ALBUMIN 2.5*   No results for input(s): LIPASE, AMYLASE in the last 168 hours. No results for input(s): AMMONIA in the last 168 hours. Coagulation Profile:  Recent Labs Lab 06/13/16 1223  INR 1.07   Cardiac Enzymes: No results for input(s): CKTOTAL, CKMB, CKMBINDEX, TROPONINI in the last 168 hours. BNP (last 3 results) No results for input(s): PROBNP in the last 8760 hours. HbA1C: No results for input(s): HGBA1C in the last 72 hours. CBG: No results for input(s): GLUCAP in the last 168 hours. Lipid Profile: No results for input(s): CHOL, HDL, LDLCALC, TRIG, CHOLHDL, LDLDIRECT in  the last 72 hours. Thyroid Function Tests: No results for input(s): TSH, T4TOTAL, FREET4, T3FREE, THYROIDAB in the last 72 hours. Anemia Panel: No results for input(s): VITAMINB12, FOLATE, FERRITIN, TIBC, IRON, RETICCTPCT in the last 72 hours. Urine analysis:    Component Value Date/Time   COLORURINE YELLOW 06/13/2016 1505   APPEARANCEUR HAZY (A) 06/13/2016 1505   LABSPEC 1.021 06/13/2016 1505   PHURINE 5.0 06/13/2016 1505   GLUCOSEU NEGATIVE 06/13/2016 1505   HGBUR MODERATE (A) 06/13/2016 1505   HGBUR large 02/27/2010 0951   BILIRUBINUR SMALL (A) 06/13/2016 1505   BILIRUBINUR 1+ 08/12/2014 0937   KETONESUR NEGATIVE 06/13/2016 1505   PROTEINUR 30 (A) 06/13/2016 1505   UROBILINOGEN 0.2 08/12/2014 0937   UROBILINOGEN 0.2 09/21/2010 0236   NITRITE NEGATIVE 06/13/2016 1505   LEUKOCYTESUR NEGATIVE 06/13/2016 1505     )No results found for this or any previous visit (from the past 240 hour(s)).    Anti-infectives    Start     Dose/Rate Route Frequency Ordered Stop   06/14/16 1430  ceFEPIme (MAXIPIME) 1 g in dextrose 5 % 50 mL IVPB     1 g 100 mL/hr over 30 Minutes Intravenous Every 24 hours 06/13/16 1547     06/13/16 1430  vancomycin (VANCOCIN) 500 mg in sodium chloride 0.9 % 100 mL IVPB  Status:  Discontinued     500 mg 100 mL/hr over 60 Minutes Intravenous Every 24 hours 06/13/16 1419 06/13/16 1546   06/13/16 1400  ceFEPIme (MAXIPIME) 1 g in dextrose 5 % 50 mL IVPB     1 g 100 mL/hr over 30 Minutes Intravenous  Once 06/13/16 1353 06/13/16 1520       Radiology Studies: Dg Chest Portable 1 View  Result Date: 06/13/2016 CLINICAL DATA:  Fever, decreased p.o. intake.  Pneumonia? EXAM: PORTABLE CHEST 1 VIEW COMPARISON:  Chest x-rays dated 06/08/2016, 04/01/2016 and 11/12/2015. FINDINGS: New ill-defined airspace opacity at the right lung base, highly suggestive of pneumonia. Lungs otherwise clear. No pleural effusion or pneumothorax seen. Heart size is stable. Atherosclerotic  changes noted at the aortic arch. IMPRESSION: Right lower lobe pneumonia. Aortic atherosclerosis. Electronically Signed   By: Franki Cabot M.D.   On: 06/13/2016 13:45        Scheduled Meds: . amLODipine  10 mg Oral Daily  . aspirin EC  81 mg Oral Daily  . enoxaparin (LOVENOX) injection  30 mg Subcutaneous Q24H  . guaiFENesin  600 mg Oral BID  . isosorbide mononitrate  30 mg Oral Daily  . [START ON 06/15/2016] metoprolol succinate  25 mg Oral Daily  . polyvinyl alcohol  1 drop Both Eyes QID  . predniSONE  40 mg Oral Q breakfast  . tiotropium  18 mcg Inhalation Daily  Continuous Infusions: . ceFEPime (MAXIPIME) IV       LOS: 0 days    Time spent: 25 min    [place], DO [facility] Pager [phone]  If 7PM-7AM, please contact night-coverage [email or web address] Password [facility] 06/14/2016, 11:26 AM

## 2016-06-15 LAB — CBC
HEMATOCRIT: 36.1 % (ref 36.0–46.0)
HEMOGLOBIN: 11.3 g/dL — AB (ref 12.0–15.0)
MCH: 28.8 pg (ref 26.0–34.0)
MCHC: 31.3 g/dL (ref 30.0–36.0)
MCV: 91.9 fL (ref 78.0–100.0)
Platelets: 228 10*3/uL (ref 150–400)
RBC: 3.93 MIL/uL (ref 3.87–5.11)
RDW: 15.2 % (ref 11.5–15.5)
WBC: 12.1 10*3/uL — AB (ref 4.0–10.5)

## 2016-06-15 LAB — BASIC METABOLIC PANEL
ANION GAP: 8 (ref 5–15)
BUN: 38 mg/dL — ABNORMAL HIGH (ref 6–20)
CHLORIDE: 107 mmol/L (ref 101–111)
CO2: 26 mmol/L (ref 22–32)
Calcium: 8.9 mg/dL (ref 8.9–10.3)
Creatinine, Ser: 1.15 mg/dL — ABNORMAL HIGH (ref 0.44–1.00)
GFR calc non Af Amer: 42 mL/min — ABNORMAL LOW (ref >60)
GFR, EST AFRICAN AMERICAN: 48 mL/min — AB (ref >60)
GLUCOSE: 139 mg/dL — AB (ref 65–99)
POTASSIUM: 4.2 mmol/L (ref 3.5–5.1)
Sodium: 141 mmol/L (ref 135–145)

## 2016-06-15 MED ORDER — LEVALBUTEROL HCL 0.63 MG/3ML IN NEBU
0.6300 mg | INHALATION_SOLUTION | Freq: Three times a day (TID) | RESPIRATORY_TRACT | Status: DC
  Administered 2016-06-15 – 2016-06-18 (×9): 0.63 mg via RESPIRATORY_TRACT
  Filled 2016-06-15 (×9): qty 3

## 2016-06-15 MED ORDER — TRAMADOL HCL 50 MG PO TABS
50.0000 mg | ORAL_TABLET | Freq: Four times a day (QID) | ORAL | Status: DC | PRN
  Administered 2016-06-17: 50 mg via ORAL
  Filled 2016-06-15: qty 1

## 2016-06-15 NOTE — Progress Notes (Signed)
Paged Dr Eliseo Squires, order clarification, T/O dc cont. Pulse ox and tele.

## 2016-06-15 NOTE — Progress Notes (Addendum)
PROGRESS NOTE    [name] [name]  JIR:[id number] DOB: 10/18/1927 DOA: 06/13/2016 PCP: Burnis Medin, MD   Outpatient Specialists:     Brief Narrative:  [name] [name] is a 83 y.o. female  with past mental history significant for coronary artery disease hypertension hypothyroid peripheral vascular COPD disease presents to the hospital with chief complaint of shortness of breath. Patient acutely began to have short of breath today. She did not have any of her breathing medication at home. She'll let a neighbor know what was going on. Transport to the hospital was organized.   Assessment & Plan:   Active Problems:   Respiratory failure with hypoxia (HCC)   Lobar pneumonia (HCC)   Acute respiratory failure due to right middle lobe pneumonia (recent hospitalization) w/ COPD component Scheduled DuoNeb's When necessary albuterol Antibiotic-vancomycin and cefepime given the emergency room, will continue cefepime only -MRSA swab negative Oxygen therapy- wean as tolerated- down to 1L Sputum culture no collected -blood cultures pending Mucinex Flutter valve Incentive spirometer -PO prednisone   Hypertension When necessary hydralazine 10 mg IV as needed for severe blood pressure Cont home meds: norvasc, toprol-xl  CAD Cont asa 81mg , Imdur  COPD QD spiriva Prn xopenex  Chronic pain resume tramadol  Recent clavicular fracture -PT eval  DVT prophylaxis:  Lovenox  Code Status: DNR   Family Communication:   Disposition Plan:     Consultants:    Subjective: Patient lives alone and plans to go back home with the help of 2 friends.  Very independent and has been working on her garden  Objective: Vitals:   06/14/16 1800 06/14/16 2103 06/15/16 0300 06/15/16 0721  BP: (!) 109/39 (!) 119/42 (!) 124/50   Pulse: 61 61 (!) 54   Resp: 18 18 18    Temp: 97.7 F (36.5 C) 97.5 F (36.4 C) 97.9 F (36.6 C)   TempSrc: Oral Oral Oral   SpO2: 97% 96%  95% 95%  Weight:  45 kg (99 lb 1.6 oz)    Height:        Intake/Output Summary (Last 24 hours) at 06/15/16 0833 Last data filed at 06/15/16 0459  Gross per 24 hour  Intake              480 ml  Output               50 ml  Net              430 ml   Filed Weights   06/13/16 1412 06/13/16 2031 06/14/16 2103  Weight: 42.6 kg (94 lb) 44.2 kg (97 lb 8 oz) 45 kg (99 lb 1.6 oz)    Examination:  General exam: more relaxed, no increased work of breathing Respiratory system: moving more air, poor effort Cardiovascular system: rrr , No pedal edema. Gastrointestinal system: +BS, soft Central nervous system: A+Ox3, NAd Psychiatry: Judgement and insight appear normal. Mood & affect appropriate.     Data Reviewed: I have personally reviewed following labs and imaging studies  CBC:  Recent Labs Lab 06/13/16 1223 06/14/16 0539 06/15/16 0601  WBC 7.8 7.4 12.1*  NEUTROABS 6.0  --   --   HGB 12.5 13.0 11.3*  HCT 38.9 40.8 36.1  MCV 92.0 92.5 91.9  PLT 204 183 751   Basic Metabolic Panel:  Recent Labs Lab 06/13/16 1223 06/14/16 0539 06/15/16 0601  NA 137 140 141  K 3.4* 4.9 4.2  CL 102 106 107  CO2 25 22 26  GLUCOSE 112* 174* 139*  BUN 21* 26* 38*  CREATININE 1.07* 1.05* 1.15*  CALCIUM 8.9 8.4* 8.9   GFR: Estimated Creatinine Clearance: 24.9 mL/min (A) (by C-G formula based on SCr of 1.15 mg/dL (H)). Liver Function Tests:  Recent Labs Lab 06/13/16 1223  AST 39  ALT 26  ALKPHOS 173*  BILITOT 1.0  PROT 6.1*  ALBUMIN 2.5*   No results for input(s): LIPASE, AMYLASE in the last 168 hours. No results for input(s): AMMONIA in the last 168 hours. Coagulation Profile:  Recent Labs Lab 06/13/16 1223  INR 1.07   Cardiac Enzymes: No results for input(s): CKTOTAL, CKMB, CKMBINDEX, TROPONINI in the last 168 hours. BNP (last 3 results) No results for input(s): PROBNP in the last 8760 hours. HbA1C: No results for input(s): HGBA1C in the last 72 hours. CBG: No  results for input(s): GLUCAP in the last 168 hours. Lipid Profile: No results for input(s): CHOL, HDL, LDLCALC, TRIG, CHOLHDL, LDLDIRECT in the last 72 hours. Thyroid Function Tests: No results for input(s): TSH, T4TOTAL, FREET4, T3FREE, THYROIDAB in the last 72 hours. Anemia Panel: No results for input(s): VITAMINB12, FOLATE, FERRITIN, TIBC, IRON, RETICCTPCT in the last 72 hours. Urine analysis:    Component Value Date/Time   COLORURINE YELLOW 06/13/2016 1505   APPEARANCEUR HAZY (A) 06/13/2016 1505   LABSPEC 1.021 06/13/2016 1505   PHURINE 5.0 06/13/2016 1505   GLUCOSEU NEGATIVE 06/13/2016 1505   HGBUR MODERATE (A) 06/13/2016 1505   HGBUR large 02/27/2010 0951   BILIRUBINUR SMALL (A) 06/13/2016 1505   BILIRUBINUR 1+ 08/12/2014 0937   KETONESUR NEGATIVE 06/13/2016 1505   PROTEINUR 30 (A) 06/13/2016 1505   UROBILINOGEN 0.2 08/12/2014 0937   UROBILINOGEN 0.2 09/21/2010 0236   NITRITE NEGATIVE 06/13/2016 1505   LEUKOCYTESUR NEGATIVE 06/13/2016 1505     ) Recent Results (from the past 240 hour(s))  Culture, blood (Routine x 2)     Status: None (Preliminary result)   Collection Time: 06/13/16 12:52 PM  Result Value Ref Range Status   Specimen Description BLOOD RIGHT FOREARM  Final   Special Requests IN PEDIATRIC BOTTLE Blood Culture adequate volume  Final   Culture NO GROWTH < 24 HOURS  Final   Report Status PENDING  Incomplete  Culture, blood (Routine x 2)     Status: None (Preliminary result)   Collection Time: 06/13/16  1:55 PM  Result Value Ref Range Status   Specimen Description BLOOD LEFT ARM  Final   Special Requests   Final    BOTTLES DRAWN AEROBIC AND ANAEROBIC Blood Culture adequate volume   Culture NO GROWTH < 24 HOURS  Final   Report Status PENDING  Incomplete  MRSA PCR Screening     Status: None   Collection Time: 06/14/16 11:36 AM  Result Value Ref Range Status   MRSA by PCR NEGATIVE NEGATIVE Final    Comment:        The GeneXpert MRSA Assay (FDA approved  for NASAL specimens only), is one component of a comprehensive MRSA colonization surveillance program. It is not intended to diagnose MRSA infection nor to guide or monitor treatment for MRSA infections.       Anti-infectives    Start     Dose/Rate Route Frequency Ordered Stop   06/14/16 1430  ceFEPIme (MAXIPIME) 1 g in dextrose 5 % 50 mL IVPB     1 g 100 mL/hr over 30 Minutes Intravenous Every 24 hours 06/13/16 1547     06/13/16 1430  vancomycin (VANCOCIN) 500  mg in sodium chloride 0.9 % 100 mL IVPB  Status:  Discontinued     500 mg 100 mL/hr over 60 Minutes Intravenous Every 24 hours 06/13/16 1419 06/13/16 1546   06/13/16 1400  ceFEPIme (MAXIPIME) 1 g in dextrose 5 % 50 mL IVPB     1 g 100 mL/hr over 30 Minutes Intravenous  Once 06/13/16 1353 06/13/16 1520       Radiology Studies: Dg Chest Portable 1 View  Result Date: 06/13/2016 CLINICAL DATA:  Fever, decreased p.o. intake.  Pneumonia? EXAM: PORTABLE CHEST 1 VIEW COMPARISON:  Chest x-rays dated 06/08/2016, 04/01/2016 and 11/12/2015. FINDINGS: New ill-defined airspace opacity at the right lung base, highly suggestive of pneumonia. Lungs otherwise clear. No pleural effusion or pneumothorax seen. Heart size is stable. Atherosclerotic changes noted at the aortic arch. IMPRESSION: Right lower lobe pneumonia. Aortic atherosclerosis. Electronically Signed   By: Franki Cabot M.D.   On: 06/13/2016 13:45        Scheduled Meds: . amLODipine  10 mg Oral Daily  . aspirin EC  81 mg Oral Daily  . enoxaparin (LOVENOX) injection  30 mg Subcutaneous Q24H  . guaiFENesin  600 mg Oral BID  . isosorbide mononitrate  30 mg Oral Daily  . metoprolol succinate  25 mg Oral Daily  . polyvinyl alcohol  1 drop Both Eyes QID  . predniSONE  40 mg Oral Q breakfast  . tiotropium  18 mcg Inhalation Daily   Continuous Infusions: . ceFEPime (MAXIPIME) IV Stopped (06/14/16 1602)     LOS: 1 day    Time spent: 25 min    [place],  DO [facility] Pager [phone]  If 7PM-7AM, please contact night-coverage [email or web address] Password [facility] 06/15/2016, 8:33 AM

## 2016-06-15 NOTE — Progress Notes (Signed)
Orthopedic Tech Progress Note Patient Details:  [name] [birth year] [id number]  Ortho Devices Type of Ortho Device: Sling immobilizer Ortho Device/Splint Interventions: Application   [place] Pink 06/15/2016, 11:30 AM

## 2016-06-15 NOTE — Evaluation (Signed)
Physical Therapy Evaluation Patient Details Name: [name] [name] MRN: [id number] DOB: September 13, 1927 Today's Date: 06/15/2016   History of Present Illness  [name] [name] is a 83 y.o. female  with past mental history significant for coronary artery disease hypertension hypothyroid peripheral vascular COPD disease presents to the hospital with chief complaint of shortness of breath.  REcent falls with R distal clavicular fx (ok to use for ADLs, no lifting over 5 pounds), and pelvic fxs treated non-operatively  Clinical Impression   Pt admitted with above diagnosis. Pt currently with functional limitations due to the deficits listed below (see PT Problem List). Difficult dc situation as she lives alone, and while we may rec SNF for post-acute rehab, she will decline; would likel to max out HHservices; with pain in hips and R shoulder - she may need to rely mostly on wheelchair for mobility until her shoulder heals;  Pt will benefit from skilled PT to increase their independence and safety with mobility to allow discharge to the venue listed below.       Follow Up Recommendations Home health PT;Supervision/Assistance - 24 hour;Other (comment) (HHOT, RN)    Equipment Recommendations  None recommended by PT    Recommendations for Other Services OT consult     Precautions / Restrictions Precautions Precautions: Fall Restrictions Weight Bearing Restrictions:  (no lifting over 5 lbs R UE)      Mobility  Bed Mobility Overal bed mobility: Needs Assistance Bed Mobility: Rolling;Sidelying to Sit Rolling: Supervision Sidelying to sit: Mod assist       General bed mobility comments: Mod assist to push to sit from L side  Transfers Overall transfer level: Needs assistance Equipment used: Rolling walker (2 wheeled) Transfers: Sit to/from Stand Sit to Stand: Min assist         General transfer comment: Cus for hand placement  Ambulation/Gait Ambulation/Gait assistance:  Min guard Ambulation Distance (Feet): 10 Feet (x2) Assistive device: Rolling walker (2 wheeled) Gait Pattern/deviations: Decreased step length - right;Decreased step length - left   Gait velocity interpretation: Below normal speed for age/gender General Gait Details: Amb distance limited by pain in hip and R shoulder; walked on room air and O2 sats remained greater than or equal to 92%  Stairs            Wheelchair Mobility    Modified Rankin (Stroke Patients Only)       Balance Overall balance assessment: Needs assistance   Sitting balance-Leahy Scale: Fair       Standing balance-Leahy Scale: Poor                               Pertinent Vitals/Pain Pain Assessment: Faces Faces Pain Scale: Hurts even more Pain Location: R hip and shoulder with walkign using RW Pain Descriptors / Indicators: Aching;Discomfort;Grimacing Pain Intervention(s): Limited activity within patient's tolerance    Home Living Family/patient expects to be discharged to:: Private residence Living Arrangements: Alone Available Help at Discharge: Neighbor;Available PRN/intermittently Type of Home: House Home Access: Stairs to enter Entrance Stairs-Rails: None Entrance Stairs-Number of Steps: 1 Home Layout: One level Home Equipment: Walker - 2 wheels;Toilet riser;Shower seat;Cane - single point;Wheelchair - manual      Prior Function Level of Independence: Independent with assistive device(s)         Comments: Uses wheelchair when she needs to move more quickly     Hand Dominance   Dominant Hand: Right  Extremity/Trunk Assessment   Upper Extremity Assessment Upper Extremity Assessment: Defer to OT evaluation    Lower Extremity Assessment Lower Extremity Assessment: Generalized weakness (hip pain with walking)    Cervical / Trunk Assessment Cervical / Trunk Assessment: Kyphotic  Communication   Communication: No difficulties  Cognition Arousal/Alertness:  Awake/alert Behavior During Therapy: WFL for tasks assessed/performed Overall Cognitive Status: Within Functional Limits for tasks assessed                                        General Comments      Exercises     Assessment/Plan    PT Assessment Patient needs continued PT services  PT Problem List Decreased strength;Decreased range of motion;Decreased activity tolerance;Decreased balance;Decreased mobility;Decreased coordination;Decreased knowledge of use of DME;Decreased knowledge of precautions;Pain;Cardiopulmonary status limiting activity       PT Treatment Interventions DME instruction;Gait training;Functional mobility training;Therapeutic activities;Therapeutic exercise;Balance training;Patient/family education;Wheelchair mobility training    PT Goals (Current goals can be found in the Care Plan section)  Acute Rehab PT Goals Patient Stated Goal: wants to get home PT Goal Formulation: With patient Time For Goal Achievement: 06/29/16 Potential to Achieve Goals: Fair    Frequency Min 3X/week   Barriers to discharge Decreased caregiver support Lives alone; must be modified independent    Co-evaluation               AM-PAC PT "6 Clicks" Daily Activity  Outcome Measure Difficulty turning over in bed (including adjusting bedclothes, sheets and blankets)?: Total Difficulty moving from lying on back to sitting on the side of the bed? : Total Difficulty sitting down on and standing up from a chair with arms (e.g., wheelchair, bedside commode, etc,.)?: Total Help needed moving to and from a bed to chair (including a wheelchair)?: A Little Help needed walking in hospital room?: A Little Help needed climbing 3-5 steps with a railing? : A Lot 6 Click Score: 11    End of Session Equipment Utilized During Treatment: Gait belt Activity Tolerance: Patient tolerated treatment well Patient left: in bed;with call bell/phone within reach;Other (comment)  (sitting EOB) Nurse Communication: Mobility status PT Visit Diagnosis: Unsteadiness on feet (R26.81);Other abnormalities of gait and mobility (R26.89);Pain Pain - Right/Left: Right (shoulder; bilateral hips) Pain - part of body: Shoulder    Time: 1245-8099 PT Time Calculation (min) (ACUTE ONLY): 28 min   Charges:   PT Evaluation $PT Eval Moderate Complexity: 1 Procedure PT Treatments $Gait Training: 8-22 mins   PT G Codes:        Roney Marion, PT  Acute Rehabilitation Services Pager [phone] Office [place] 06/15/2016, 5:23 PM

## 2016-06-16 DIAGNOSIS — S42026S Nondisplaced fracture of shaft of unspecified clavicle, sequela: Secondary | ICD-10-CM

## 2016-06-16 MED ORDER — DOXYCYCLINE HYCLATE 100 MG PO TABS
100.0000 mg | ORAL_TABLET | Freq: Two times a day (BID) | ORAL | Status: DC
  Administered 2016-06-16 – 2016-06-18 (×5): 100 mg via ORAL
  Filled 2016-06-16 (×5): qty 1

## 2016-06-16 NOTE — Progress Notes (Signed)
PROGRESS NOTE    [name] [name]  GLO:[id number] DOB: October 05, 1927 DOA: 06/13/2016 PCP: Burnis Medin, MD   Outpatient Specialists:     Brief Narrative:  [name] [name] is a 83 y.o. female  with past mental history significant for coronary artery disease hypertension hypothyroid peripheral vascular COPD disease presents to the hospital with chief complaint of shortness of breath. Patient acutely began to have short of breath today. She did not have any of her breathing medication at home. She'll let a neighbor know what was going on. Transport to the hospital was organized.   Assessment & Plan:   Active Problems:   Respiratory failure with hypoxia (HCC)   Lobar pneumonia (HCC)   Acute respiratory failure due to right middle lobe pneumonia (recent hospitalization) w/ COPD component Scheduled DuoNeb's When necessary albuterol Antibiotic-vancomycin and cefepime given the emergency room- wean to PO doxy -MRSA swab negative Oxygen therapy- wean as tolerated- down to 1L Sputum culture no collected -blood cultures pending Mucinex Flutter valve Incentive spirometer -PO prednisone   Hypertension When necessary hydralazine 10 mg IV as needed for severe blood pressure Cont home meds: norvasc, toprol-xl  CAD Cont asa 81mg , Imdur  COPD QD spiriva Prn xopenex  Chronic pain resume tramadol  Recent clavicular fracture -PT eval  -refusing SNF-- says family will come down to either stay with her or take her back to [place]   DVT prophylaxis:  Lovenox  Code Status: DNR   Family Communication: Called Kim-- no answer  Disposition Plan:     Consultants:    Subjective: Anxious to go home but says she is coughing more today  Objective: Vitals:   06/15/16 1743 06/15/16 2019 06/15/16 2059 06/16/16 0845  BP: (!) 116/49  (!) 134/48   Pulse: 62  67   Resp: 18  18   Temp: 98.2 F (36.8 C)  98 F (36.7 C)   TempSrc: Oral  Oral   SpO2: 98% 97% 97% 93%   Weight:   46.3 kg (102 lb 1.6 oz)   Height:        Intake/Output Summary (Last 24 hours) at 06/16/16 0937 Last data filed at 06/16/16 0600  Gross per 24 hour  Intake              600 ml  Output                0 ml  Net              600 ml   Filed Weights   06/13/16 2031 06/14/16 2103 06/15/16 2059  Weight: 44.2 kg (97 lb 8 oz) 45 kg (99 lb 1.6 oz) 46.3 kg (102 lb 1.6 oz)    Examination:  General exam: coughing more but in chair eating breakfast- not on O2 Respiratory system: more congested, occ wheezing Cardiovascular system: rrr , No pedal edema. Gastrointestinal system: +BS, soft Central nervous system: A+Ox3, NAd Psychiatry: Judgement and insight appear normal. Mood & affect appropriate.     Data Reviewed: I have personally reviewed following labs and imaging studies  CBC:  Recent Labs Lab 06/13/16 1223 06/14/16 0539 06/15/16 0601  WBC 7.8 7.4 12.1*  NEUTROABS 6.0  --   --   HGB 12.5 13.0 11.3*  HCT 38.9 40.8 36.1  MCV 92.0 92.5 91.9  PLT 204 183 188   Basic Metabolic Panel:  Recent Labs Lab 06/13/16 1223 06/14/16 0539 06/15/16 0601  NA 137 140 141  K 3.4* 4.9 4.2  CL  102 106 107  CO2 25 22 26   GLUCOSE 112* 174* 139*  BUN 21* 26* 38*  CREATININE 1.07* 1.05* 1.15*  CALCIUM 8.9 8.4* 8.9   GFR: Estimated Creatinine Clearance: 25.7 mL/min (A) (by C-G formula based on SCr of 1.15 mg/dL (H)). Liver Function Tests:  Recent Labs Lab 06/13/16 1223  AST 39  ALT 26  ALKPHOS 173*  BILITOT 1.0  PROT 6.1*  ALBUMIN 2.5*   No results for input(s): LIPASE, AMYLASE in the last 168 hours. No results for input(s): AMMONIA in the last 168 hours. Coagulation Profile:  Recent Labs Lab 06/13/16 1223  INR 1.07   Cardiac Enzymes: No results for input(s): CKTOTAL, CKMB, CKMBINDEX, TROPONINI in the last 168 hours. BNP (last 3 results) No results for input(s): PROBNP in the last 8760 hours. HbA1C: No results for input(s): HGBA1C in the last 72  hours. CBG: No results for input(s): GLUCAP in the last 168 hours. Lipid Profile: No results for input(s): CHOL, HDL, LDLCALC, TRIG, CHOLHDL, LDLDIRECT in the last 72 hours. Thyroid Function Tests: No results for input(s): TSH, T4TOTAL, FREET4, T3FREE, THYROIDAB in the last 72 hours. Anemia Panel: No results for input(s): VITAMINB12, FOLATE, FERRITIN, TIBC, IRON, RETICCTPCT in the last 72 hours. Urine analysis:    Component Value Date/Time   COLORURINE YELLOW 06/13/2016 1505   APPEARANCEUR HAZY (A) 06/13/2016 1505   LABSPEC 1.021 06/13/2016 1505   PHURINE 5.0 06/13/2016 1505   GLUCOSEU NEGATIVE 06/13/2016 1505   HGBUR MODERATE (A) 06/13/2016 1505   HGBUR large 02/27/2010 0951   BILIRUBINUR SMALL (A) 06/13/2016 1505   BILIRUBINUR 1+ 08/12/2014 0937   KETONESUR NEGATIVE 06/13/2016 1505   PROTEINUR 30 (A) 06/13/2016 1505   UROBILINOGEN 0.2 08/12/2014 0937   UROBILINOGEN 0.2 09/21/2010 0236   NITRITE NEGATIVE 06/13/2016 1505   LEUKOCYTESUR NEGATIVE 06/13/2016 1505     ) Recent Results (from the past 240 hour(s))  Culture, blood (Routine x 2)     Status: None (Preliminary result)   Collection Time: 06/13/16 12:52 PM  Result Value Ref Range Status   Specimen Description BLOOD RIGHT FOREARM  Final   Special Requests IN PEDIATRIC BOTTLE Blood Culture adequate volume  Final   Culture NO GROWTH 2 DAYS  Final   Report Status PENDING  Incomplete  Culture, blood (Routine x 2)     Status: None (Preliminary result)   Collection Time: 06/13/16  1:55 PM  Result Value Ref Range Status   Specimen Description BLOOD LEFT ARM  Final   Special Requests   Final    BOTTLES DRAWN AEROBIC AND ANAEROBIC Blood Culture adequate volume   Culture NO GROWTH 2 DAYS  Final   Report Status PENDING  Incomplete  MRSA PCR Screening     Status: None   Collection Time: 06/14/16 11:36 AM  Result Value Ref Range Status   MRSA by PCR NEGATIVE NEGATIVE Final    Comment:        The GeneXpert MRSA Assay  (FDA approved for NASAL specimens only), is one component of a comprehensive MRSA colonization surveillance program. It is not intended to diagnose MRSA infection nor to guide or monitor treatment for MRSA infections.       Anti-infectives    Start     Dose/Rate Route Frequency Ordered Stop   06/16/16 1000  doxycycline (VIBRA-TABS) tablet 100 mg     100 mg Oral Every 12 hours 06/16/16 0827     06/14/16 1430  ceFEPIme (MAXIPIME) 1 g in dextrose 5 %  50 mL IVPB  Status:  Discontinued     1 g 100 mL/hr over 30 Minutes Intravenous Every 24 hours 06/13/16 1547 06/16/16 0827   06/13/16 1430  vancomycin (VANCOCIN) 500 mg in sodium chloride 0.9 % 100 mL IVPB  Status:  Discontinued     500 mg 100 mL/hr over 60 Minutes Intravenous Every 24 hours 06/13/16 1419 06/13/16 1546   06/13/16 1400  ceFEPIme (MAXIPIME) 1 g in dextrose 5 % 50 mL IVPB     1 g 100 mL/hr over 30 Minutes Intravenous  Once 06/13/16 1353 06/13/16 1520       Radiology Studies: No results found.      Scheduled Meds: . amLODipine  10 mg Oral Daily  . aspirin EC  81 mg Oral Daily  . doxycycline  100 mg Oral Q12H  . enoxaparin (LOVENOX) injection  30 mg Subcutaneous Q24H  . guaiFENesin  600 mg Oral BID  . isosorbide mononitrate  30 mg Oral Daily  . levalbuterol  0.63 mg Nebulization TID  . metoprolol succinate  25 mg Oral Daily  . polyvinyl alcohol  1 drop Both Eyes QID  . predniSONE  40 mg Oral Q breakfast  . tiotropium  18 mcg Inhalation Daily   Continuous Infusions:    LOS: 2 days    Time spent: 25 min    Luther, DO [facility] Pager [phone]  If 7PM-7AM, please contact night-coverage [email or web address] Password [facility] 06/16/2016, 9:37 AM

## 2016-06-16 NOTE — Progress Notes (Signed)
Physical Therapy Treatment Patient Details Name: [name] [name] MRN: [id number] DOB: 1927/02/13 Today's Date: 06/16/2016    History of Present Illness [name] [name] is a 83 y.o. female  with past mental history significant for coronary artery disease hypertension hypothyroid peripheral vascular COPD disease presents to the hospital with chief complaint of shortness of breath.  REcent falls with R distal clavicular fx (ok to use for ADLs, no lifting over 5 pounds), and pelvic fxs treated non-operatively    PT Comments    Pt performed increased mobility and progressed gait distance with use of RW.  Pt remains to c/o pain.  Pt will continue to benefit from skilled rehab in acute setting before as she is not interested in receiving rehab in a post acute setting.   Will continue to recommend [facility] therapies at this time.  Pt is agreeable to HHPT.  Pt reports her daughter is coming from [place] to stay with her for a week.   Follow Up Recommendations  Home health PT;Supervision/Assistance - 24 hour;Other (comment) (HHOT and RN)     Equipment Recommendations  None recommended by PT    Recommendations for Other Services OT consult     Precautions / Restrictions Precautions Precautions: Fall Restrictions Weight Bearing Restrictions: No    Mobility  Bed Mobility Overal bed mobility: Needs Assistance Bed Mobility: Rolling;Sidelying to Sit Rolling: Supervision Sidelying to sit: Supervision       General bed mobility comments: Cues for hand placement.  Assist and guidance to improve ease and decrease pain.    Transfers Overall transfer level: Needs assistance Equipment used: Rolling walker (2 wheeled) Transfers: Sit to/from Stand Sit to Stand: Min guard         General transfer comment: Cues for hand placement to and from seated surface.  encouraged patient to limit use of RUE to and from seated surface.    Ambulation/Gait Ambulation/Gait assistance: Min  guard Ambulation Distance (Feet): 40 Feet Assistive device: Rolling walker (2 wheeled) Gait Pattern/deviations: Decreased step length - right;Decreased step length - left;Antalgic     General Gait Details: Amb distance limited by pain in hip and R shoulder; walked on room air unable to obtain O2 sats on portable pulse oximeter.     Stairs            Wheelchair Mobility    Modified Rankin (Stroke Patients Only)       Balance Overall balance assessment: Needs assistance   Sitting balance-Leahy Scale: Fair       Standing balance-Leahy Scale: Fair                              Cognition Arousal/Alertness: Awake/alert Behavior During Therapy: WFL for tasks assessed/performed Overall Cognitive Status: Within Functional Limits for tasks assessed                                        Exercises      General Comments        Pertinent Vitals/Pain Pain Assessment: 0-10 Pain Score: 5  Pain Location: R hip and shoulder, tail bone.  Pain Descriptors / Indicators: Aching;Discomfort;Grimacing Pain Intervention(s): Monitored during session    Home Living                      Prior Function  PT Goals (current goals can now be found in the care plan section) Acute Rehab PT Goals Patient Stated Goal: wants to get home Potential to Achieve Goals: Fair Progress towards PT goals: Progressing toward goals    Frequency    Min 3X/week      PT Plan Current plan remains appropriate    Co-evaluation              AM-PAC PT "6 Clicks" Daily Activity  Outcome Measure  Difficulty turning over in bed (including adjusting bedclothes, sheets and blankets)?: A Lot Difficulty moving from lying on back to sitting on the side of the bed? : A Lot Difficulty sitting down on and standing up from a chair with arms (e.g., wheelchair, bedside commode, etc,.)?: A Lot Help needed moving to and from a bed to chair (including a  wheelchair)?: A Little Help needed walking in hospital room?: A Little Help needed climbing 3-5 steps with a railing? : A Lot 6 Click Score: 14    End of Session Equipment Utilized During Treatment: Gait belt Activity Tolerance: Patient tolerated treatment well Patient left: in bed;with call bell/phone within reach Nurse Communication: Mobility status PT Visit Diagnosis: Unsteadiness on feet (R26.81);Other abnormalities of gait and mobility (R26.89);Pain Pain - Right/Left: Right (shoulder and Bilateral Hips) Pain - part of body: Shoulder     Time: 6286-3817 PT Time Calculation (min) (ACUTE ONLY): 20 min  Charges:  $Gait Training: 8-22 mins                    G Codes:       Governor Rooks, PTA pager [phone]    Cristela Blue 06/16/2016, 5:57 PM

## 2016-06-17 MED ORDER — PREDNISONE 20 MG PO TABS
30.0000 mg | ORAL_TABLET | Freq: Every day | ORAL | Status: DC
  Administered 2016-06-18: 30 mg via ORAL
  Filled 2016-06-17: qty 1

## 2016-06-17 NOTE — Progress Notes (Signed)
Physical Therapy Treatment Patient Details Name: [name] [name] MRN: [id number] DOB: 02/24/1927 Today's Date: 06/17/2016    History of Present Illness [name] [name] is a 83 y.o. female  with past mental history significant for coronary artery disease hypertension hypothyroid peripheral vascular COPD disease presents to the hospital with chief complaint of shortness of breath.  REcent falls with R distal clavicular fx (ok to use for ADLs, no lifting over 5 pounds), and pelvic fxs treated non-operatively    PT Comments    Pt presents with improved tolerance for gait distance this session despite increased discomfort. Pt requires increased assistance to get EOB this session, but requires decreased assistance once in standing. Pt continues to want HHPT and is not open to SNF placement.    Follow Up Recommendations  Home health PT;Supervision/Assistance - 24 hour;Other (comment)     Equipment Recommendations  None recommended by PT    Recommendations for Other Services OT consult     Precautions / Restrictions Precautions Precautions: Fall Precaution Comments: Fall prior to admission Restrictions Weight Bearing Restrictions: No    Mobility  Bed Mobility Overal bed mobility: Needs Assistance Bed Mobility: Rolling;Sidelying to Sit Rolling: Min assist Sidelying to sit: Min assist       General bed mobility comments: Min A this session due to pain.   Transfers Overall transfer level: Needs assistance Equipment used: Rolling walker (2 wheeled) Transfers: Sit to/from Stand Sit to Stand: Min guard         General transfer comment: Cues for hand placement to and from seated surface.  encouraged patient to limit use of RUE to and from seated surface.    Ambulation/Gait Ambulation/Gait assistance: Min guard Ambulation Distance (Feet): 60 Feet Assistive device: Rolling walker (2 wheeled) Gait Pattern/deviations: Decreased step length - right;Decreased step  length - left;Antalgic Gait velocity: decreased Gait velocity interpretation: Below normal speed for age/gender General Gait Details: increased gait distance with RW this session despite pt not wanting perform activity.  continues with slow cadence   Stairs            Wheelchair Mobility    Modified Rankin (Stroke Patients Only)       Balance Overall balance assessment: Needs assistance Sitting-balance support: No upper extremity supported;Feet supported Sitting balance-Leahy Scale: Good     Standing balance support: No upper extremity supported Standing balance-Leahy Scale: Fair                              Cognition Arousal/Alertness: Awake/alert Behavior During Therapy: WFL for tasks assessed/performed Overall Cognitive Status: Within Functional Limits for tasks assessed                                        Exercises      General Comments        Pertinent Vitals/Pain Pain Assessment: 0-10 Pain Score: 5  Pain Location: R hip and shoulder, tail bone.  Pain Descriptors / Indicators: Aching;Discomfort;Grimacing Pain Intervention(s): Monitored during session;Repositioned    Home Living                      Prior Function            PT Goals (current goals can now be found in the care plan section) Acute Rehab PT Goals Patient Stated Goal: wants to get  home Progress towards PT goals: Progressing toward goals    Frequency    Min 3X/week      PT Plan Current plan remains appropriate    Co-evaluation              AM-PAC PT "6 Clicks" Daily Activity  Outcome Measure  Difficulty turning over in bed (including adjusting bedclothes, sheets and blankets)?: Total Difficulty moving from lying on back to sitting on the side of the bed? : Total Difficulty sitting down on and standing up from a chair with arms (e.g., wheelchair, bedside commode, etc,.)?: A Lot Help needed moving to and from a bed to chair  (including a wheelchair)?: A Little Help needed walking in hospital room?: A Little Help needed climbing 3-5 steps with a railing? : A Lot 6 Click Score: 12    End of Session Equipment Utilized During Treatment: Gait belt Activity Tolerance: Patient tolerated treatment well Patient left: in bed;with call bell/phone within reach Nurse Communication: Mobility status PT Visit Diagnosis: Unsteadiness on feet (R26.81);Other abnormalities of gait and mobility (R26.89);Pain Pain - Right/Left: Right Pain - part of body: Shoulder     Time: 6578-4696 PT Time Calculation (min) (ACUTE ONLY): 27 min  Charges:  $Gait Training: 8-22 mins $Therapeutic Activity: 8-22 mins                    G Codes:       Scheryl Marten PT, DPT  [phone]    Jacqulyn Liner Sloan Leiter 06/17/2016, 1:21 PM

## 2016-06-17 NOTE — Evaluation (Signed)
Occupational Therapy Evaluation Patient Details Name: [name] [name] MRN: [id number] DOB: 10/18/1927 Today's Date: 06/17/2016    History of Present Illness [name] [name] is a 83 y.o. female  with past mental history significant for coronary artery disease hypertension hypothyroid peripheral vascular COPD disease presents to the hospital with chief complaint of shortness of breath.  REcent falls with R distal clavicular fx (ok to use for ADLs, no lifting over 5 pounds), and pelvic fxs treated non-operatively   Clinical Impression   Pt reports she was managing ADL at mod I level PTA; occasional help from aide. Currently pt overall min assist for functional mobility and ADL with the exception of mod-max assist for LB ADL. Pt presenting with pain, decreased activity tolerance, generalized weakness, and hx of falls impacting her independence and safety with ADL and functional mobility. Pt planning to d/c home with 24/7 supervision initially from her daughter who is coming down from [place] to stay with pt. Recommending HHOT for follow up to maximize independence and safety with ADL and functional mobility upon return home. Pt would benefit from continued skilled OT to address established goals.    Follow Up Recommendations  Home health OT;Supervision/Assistance - 24 hour    Equipment Recommendations  None recommended by OT    Recommendations for Other Services       Precautions / Restrictions Precautions Precautions: Fall Precaution Comments: Fall prior to admission Restrictions Weight Bearing Restrictions: No      Mobility Bed Mobility Overal bed mobility: Needs Assistance Bed Mobility: Supine to Sit Rolling: Min assist Sidelying to sit: Min assist Supine to sit: Min assist     General bed mobility comments: Min assist for trunk elevation to sitting. Pt able to manage LEs off EOB and scooting hips out to EOB in sitting  Transfers Overall transfer level: Needs  assistance Equipment used: Rolling walker (2 wheeled) Transfers: Sit to/from Stand Sit to Stand: Min assist         General transfer comment: Min assist for balance. Good hand placement with RW    Balance Overall balance assessment: Needs assistance Sitting-balance support: Feet supported;No upper extremity supported Sitting balance-Leahy Scale: Good     Standing balance support: Bilateral upper extremity supported Standing balance-Leahy Scale: Poor Standing balance comment: RW for support                           ADL either performed or assessed with clinical judgement   ADL Overall ADL's : Needs assistance/impaired Eating/Feeding: Set up;Sitting   Grooming: Minimal assistance;Sitting   Upper Body Bathing: Minimal assistance;Sitting   Lower Body Bathing: Moderate assistance;Sit to/from stand   Upper Body Dressing : Minimal assistance;Sitting   Lower Body Dressing: Maximal assistance;Sit to/from stand   Toilet Transfer: Minimal assistance;Ambulation;RW Toilet Transfer Details (indicate cue type and reason): Simulated by sit to stand from EOB with functional mobility.,         Functional mobility during ADLs: Minimal assistance;Rolling walker       Vision         Perception     Praxis      Pertinent Vitals/Pain Pain Assessment: Faces Pain Score: 5  Faces Pain Scale: Hurts little more Pain Location: hips and back Pain Descriptors / Indicators: Sore Pain Intervention(s): Monitored during session;Repositioned     Hand Dominance Right   Extremity/Trunk Assessment Upper Extremity Assessment Upper Extremity Assessment: RUE deficits/detail RUE Deficits / Details: hx of clavicular fx RUE: Unable  to fully assess due to pain;Unable to fully assess due to immobilization   Lower Extremity Assessment Lower Extremity Assessment: Defer to PT evaluation   Cervical / Trunk Assessment Cervical / Trunk Assessment: Kyphotic   Communication  Communication Communication: No difficulties   Cognition Arousal/Alertness: Awake/alert Behavior During Therapy: WFL for tasks assessed/performed Overall Cognitive Status: Within Functional Limits for tasks assessed                                     General Comments       Exercises     Shoulder Instructions      Home Living Family/patient expects to be discharged to:: Private residence Living Arrangements: Alone Available Help at Discharge: Neighbor;Family;Available PRN/intermittently (daughter coming to stay initially) Type of Home: House Home Access: Stairs to enter [employer] of Steps: 1 Entrance Stairs-Rails: None Home Layout: One level     Bathroom Shower/Tub: Teacher, early years/pre: Standard     Home Equipment: Environmental consultant - 2 wheels;Toilet riser;Cane - single point;Wheelchair - manual;Tub bench          Prior Functioning/Environment Level of Independence: Independent with assistive device(s)        Comments: Uses wheelchair when she needs to move more quickly. Aide has been assisting some with ADL        OT Problem List: Decreased strength;Decreased activity tolerance;Impaired balance (sitting and/or standing);Decreased knowledge of use of DME or AE;Cardiopulmonary status limiting activity;Pain;Impaired UE functional use      OT Treatment/Interventions: Self-care/ADL training;Energy conservation;DME and/or AE instruction;Therapeutic activities;Patient/family education;Balance training    OT Goals(Current goals can be found in the care plan section) Acute Rehab OT Goals Patient Stated Goal: go home OT Goal Formulation: With patient Time For Goal Achievement: 07/01/16 Potential to Achieve Goals: Good ADL Goals Pt Will Perform Grooming: with supervision;standing Pt Will Perform Upper Body Bathing: with supervision;sitting Pt Will Perform Lower Body Bathing: with supervision;sit to/from stand Pt Will Transfer to  Toilet: with supervision;ambulating;bedside commode (over toilet) Pt Will Perform Toileting - Clothing Manipulation and hygiene: with supervision;sit to/from stand  OT Frequency: Min 2X/week   Barriers to D/C:            Co-evaluation              AM-PAC PT "6 Clicks" Daily Activity     Outcome Measure Help from another person eating meals?: A Little Help from another person taking care of personal grooming?: A Little Help from another person toileting, which includes using toliet, bedpan, or urinal?: A Little Help from another person bathing (including washing, rinsing, drying)?: A Lot Help from another person to put on and taking off regular upper body clothing?: A Little Help from another person to put on and taking off regular lower body clothing?: A Lot 6 Click Score: 16   End of Session Equipment Utilized During Treatment: Rolling walker;Gait belt  Activity Tolerance: Patient tolerated treatment well Patient left: in chair;with call bell/phone within reach;with chair alarm set  OT Visit Diagnosis: Unsteadiness on feet (R26.81);Pain;History of falling (Z91.81)                Time: 3154-0086 OT Time Calculation (min): 17 min Charges:  OT General Charges $OT Visit: 1 Procedure OT Evaluation $OT Eval Moderate Complexity: 1 Procedure G-Codes:     Keyla Milone A. Ulice Brilliant, M.S., OTR/L Pager: [place] 06/17/2016, 3:05 PM

## 2016-06-17 NOTE — Progress Notes (Signed)
PROGRESS NOTE    [name] [name]  GUY:[id number] DOB: 10/25/1927 DOA: 06/13/2016 PCP: Burnis Medin, MD   Outpatient Specialists:     Brief Narrative:  [name] [name] is a 83 y.o. female  with past mental history significant for coronary artery disease hypertension hypothyroid peripheral vascular COPD disease presents to the hospital with chief complaint of shortness of breath. Patient lives alone, and has had several falls lately.    Assessment & Plan:   Active Problems:   Respiratory failure with hypoxia (HCC)   Lobar pneumonia (HCC)   Acute respiratory failure due to right middle lobe pneumonia (recent hospitalization) w/ COPD component - DuoNeb's -PRN albuterol Antibiotic-vancomycin and cefepime given the emergency room- wean to PO doxy -MRSA swab negative Oxygen therapy- wean as tolerated-on RA currently Sputum culture not collected -blood cultures NGTD -Mucinex -PO prednisone -pulm hygiene  Hypertension Cont home meds: norvasc, toprol-xl  CAD Cont asa 81mg , Imdur  COPD QD spiriva Prn xopenex  Chronic pain resume tramadol  Recent clavicular fracture -as need sling  -refusing SNF-- says family will come down to either stay with her or take her back to [place]   DVT prophylaxis:  Lovenox  Code Status: DNR   Family Communication: Called Kim-- no answer  Disposition Plan:     Consultants:    Subjective: Still very congested  Objective: Vitals:   06/17/16 0514 06/17/16 0802 06/17/16 0805 06/17/16 1053  BP: (!) 169/54   (!) 146/51  Pulse: 82   (!) 59  Resp: 18     Temp: 98.3 F (36.8 C)     TempSrc: Oral     SpO2: 97% 98% 98%   Weight:      Height:        Intake/Output Summary (Last 24 hours) at 06/17/16 1207 Last data filed at 06/17/16 0853  Gross per 24 hour  Intake              660 ml  Output                0 ml  Net              660 ml   Filed Weights   06/14/16 2103 06/15/16 2059 06/16/16 2023  Weight: 45  kg (99 lb 1.6 oz) 46.3 kg (102 lb 1.6 oz) 45.2 kg (99 lb 11.2 oz)    Examination:  General exam: getting breathing treatments Respiratory system: rhales Cardiovascular system: rrr , No pedal edema. Gastrointestinal system: +BS, soft Central nervous system: A+Ox3, NAd Psychiatry: Judgement and insight appear normal. Mood & affect appropriate.     Data Reviewed: I have personally reviewed following labs and imaging studies  CBC:  Recent Labs Lab 06/13/16 1223 06/14/16 0539 06/15/16 0601  WBC 7.8 7.4 12.1*  NEUTROABS 6.0  --   --   HGB 12.5 13.0 11.3*  HCT 38.9 40.8 36.1  MCV 92.0 92.5 91.9  PLT 204 183 563   Basic Metabolic Panel:  Recent Labs Lab 06/13/16 1223 06/14/16 0539 06/15/16 0601  NA 137 140 141  K 3.4* 4.9 4.2  CL 102 106 107  CO2 25 22 26   GLUCOSE 112* 174* 139*  BUN 21* 26* 38*  CREATININE 1.07* 1.05* 1.15*  CALCIUM 8.9 8.4* 8.9   GFR: Estimated Creatinine Clearance: 25.1 mL/min (A) (by C-G formula based on SCr of 1.15 mg/dL (H)). Liver Function Tests:  Recent Labs Lab 06/13/16 1223  AST 39  ALT 26  ALKPHOS 173*  BILITOT 1.0  PROT 6.1*  ALBUMIN 2.5*   No results for input(s): LIPASE, AMYLASE in the last 168 hours. No results for input(s): AMMONIA in the last 168 hours. Coagulation Profile:  Recent Labs Lab 06/13/16 1223  INR 1.07   Cardiac Enzymes: No results for input(s): CKTOTAL, CKMB, CKMBINDEX, TROPONINI in the last 168 hours. BNP (last 3 results) No results for input(s): PROBNP in the last 8760 hours. HbA1C: No results for input(s): HGBA1C in the last 72 hours. CBG: No results for input(s): GLUCAP in the last 168 hours. Lipid Profile: No results for input(s): CHOL, HDL, LDLCALC, TRIG, CHOLHDL, LDLDIRECT in the last 72 hours. Thyroid Function Tests: No results for input(s): TSH, T4TOTAL, FREET4, T3FREE, THYROIDAB in the last 72 hours. Anemia Panel: No results for input(s): VITAMINB12, FOLATE, FERRITIN, TIBC, IRON,  RETICCTPCT in the last 72 hours. Urine analysis:    Component Value Date/Time   COLORURINE YELLOW 06/13/2016 1505   APPEARANCEUR HAZY (A) 06/13/2016 1505   LABSPEC 1.021 06/13/2016 1505   PHURINE 5.0 06/13/2016 1505   GLUCOSEU NEGATIVE 06/13/2016 1505   HGBUR MODERATE (A) 06/13/2016 1505   HGBUR large 02/27/2010 0951   BILIRUBINUR SMALL (A) 06/13/2016 1505   BILIRUBINUR 1+ 08/12/2014 0937   KETONESUR NEGATIVE 06/13/2016 1505   PROTEINUR 30 (A) 06/13/2016 1505   UROBILINOGEN 0.2 08/12/2014 0937   UROBILINOGEN 0.2 09/21/2010 0236   NITRITE NEGATIVE 06/13/2016 1505   LEUKOCYTESUR NEGATIVE 06/13/2016 1505     ) Recent Results (from the past 240 hour(s))  Culture, blood (Routine x 2)     Status: None (Preliminary result)   Collection Time: 06/13/16 12:52 PM  Result Value Ref Range Status   Specimen Description BLOOD RIGHT FOREARM  Final   Special Requests IN PEDIATRIC BOTTLE Blood Culture adequate volume  Final   Culture NO GROWTH 4 DAYS  Final   Report Status PENDING  Incomplete  Culture, blood (Routine x 2)     Status: None (Preliminary result)   Collection Time: 06/13/16  1:55 PM  Result Value Ref Range Status   Specimen Description BLOOD LEFT ARM  Final   Special Requests   Final    BOTTLES DRAWN AEROBIC AND ANAEROBIC Blood Culture adequate volume   Culture NO GROWTH 4 DAYS  Final   Report Status PENDING  Incomplete  MRSA PCR Screening     Status: None   Collection Time: 06/14/16 11:36 AM  Result Value Ref Range Status   MRSA by PCR NEGATIVE NEGATIVE Final    Comment:        The GeneXpert MRSA Assay (FDA approved for NASAL specimens only), is one component of a comprehensive MRSA colonization surveillance program. It is not intended to diagnose MRSA infection nor to guide or monitor treatment for MRSA infections.       Anti-infectives    Start     Dose/Rate Route Frequency Ordered Stop   06/16/16 1000  doxycycline (VIBRA-TABS) tablet 100 mg     100 mg Oral  Every 12 hours 06/16/16 0827     06/14/16 1430  ceFEPIme (MAXIPIME) 1 g in dextrose 5 % 50 mL IVPB  Status:  Discontinued     1 g 100 mL/hr over 30 Minutes Intravenous Every 24 hours 06/13/16 1547 06/16/16 0827   06/13/16 1430  vancomycin (VANCOCIN) 500 mg in sodium chloride 0.9 % 100 mL IVPB  Status:  Discontinued     500 mg 100 mL/hr over 60 Minutes Intravenous Every 24 hours 06/13/16 1419 06/13/16  1546   06/13/16 1400  ceFEPIme (MAXIPIME) 1 g in dextrose 5 % 50 mL IVPB     1 g 100 mL/hr over 30 Minutes Intravenous  Once 06/13/16 1353 06/13/16 1520       Radiology Studies: No results found.      Scheduled Meds: . amLODipine  10 mg Oral Daily  . aspirin EC  81 mg Oral Daily  . doxycycline  100 mg Oral Q12H  . enoxaparin (LOVENOX) injection  30 mg Subcutaneous Q24H  . guaiFENesin  600 mg Oral BID  . isosorbide mononitrate  30 mg Oral Daily  . levalbuterol  0.63 mg Nebulization TID  . metoprolol succinate  25 mg Oral Daily  . polyvinyl alcohol  1 drop Both Eyes QID  . predniSONE  40 mg Oral Q breakfast  . tiotropium  18 mcg Inhalation Daily   Continuous Infusions:    LOS: 3 days    Time spent: 25 min    [place], DO [facility] Pager [phone]  If 7PM-7AM, please contact night-coverage [email or web address] Password [email or web address] 06/17/2016, 12:07 PM

## 2016-06-18 ENCOUNTER — Inpatient Hospital Stay (HOSPITAL_COMMUNITY): Payer: Medicare HMO

## 2016-06-18 ENCOUNTER — Ambulatory Visit: Payer: Medicare HMO | Admitting: Pulmonary Disease

## 2016-06-18 DIAGNOSIS — R0602 Shortness of breath: Secondary | ICD-10-CM

## 2016-06-18 DIAGNOSIS — J9601 Acute respiratory failure with hypoxia: Secondary | ICD-10-CM

## 2016-06-18 LAB — CULTURE, BLOOD (ROUTINE X 2)
CULTURE: NO GROWTH
Culture: NO GROWTH
SPECIAL REQUESTS: ADEQUATE
Special Requests: ADEQUATE

## 2016-06-18 MED ORDER — METHYLPREDNISOLONE 4 MG PO TBPK: ORAL_TABLET | ORAL | 0 refills | Status: DC

## 2016-06-18 MED ORDER — FUROSEMIDE 10 MG/ML IJ SOLN
40.0000 mg | Freq: Once | INTRAMUSCULAR | Status: AC
  Administered 2016-06-18: 40 mg via INTRAVENOUS
  Filled 2016-06-18: qty 4

## 2016-06-18 MED ORDER — DOXYCYCLINE HYCLATE 100 MG PO TABS: 100.0000 mg | ORAL_TABLET | Freq: Two times a day (BID) | ORAL | 0 refills | Status: DC

## 2016-06-18 NOTE — Discharge Instructions (Signed)
Follow with Primary MD [name], Standley Brooking, MD in 7 days   Get CBC, CMP, 2 view Chest X ray checked  by Primary MD or SNF MD in 5-7 days ( we routinely change or add medications that can affect your baseline labs and fluid status, therefore we recommend that you get the mentioned basic workup next visit with your PCP, your PCP may decide not to get them or add new tests based on their clinical decision)  Activity: As tolerated with Full fall precautions use walker/cane & assistance as needed  Disposition Home    Diet:  Heart Healthy    For Heart failure patients - Check your Weight same time everyday, if you gain over 2 pounds, or you develop in leg swelling, experience more shortness of breath or chest pain, call your Primary MD immediately. Follow Cardiac Low Salt Diet and 1.5 lit/day fluid restriction.  On your next visit with your primary care physician please Get Medicines reviewed and adjusted.  Please request your Prim.MD to go over all Hospital Tests and Procedure/Radiological results at the follow up, please get all Hospital records sent to your Prim MD by signing hospital release before you go home.  If you experience worsening of your admission symptoms, develop shortness of breath, life threatening emergency, suicidal or homicidal thoughts you must seek medical attention immediately by calling 911 or calling your MD immediately  if symptoms less severe.  You Must read complete instructions/literature along with all the possible adverse reactions/side effects for all the Medicines you take and that have been prescribed to you. Take any new Medicines after you have completely understood and accpet all the possible adverse reactions/side effects.   Do not drive, operate heavy machinery, perform activities at heights, swimming or participation in water activities or provide baby sitting services if your were admitted for syncope or siezures until you have seen by Primary MD or a Neurologist  and advised to do so again.  Do not drive when taking Pain medications.    Do not take more than prescribed Pain, Sleep and Anxiety Medications  Special Instructions: If you have smoked or chewed Tobacco  in the last 2 yrs please stop smoking, stop any regular Alcohol  and or any Recreational drug use.  Wear Seat belts while driving.   Please note  You were cared for by a hospitalist during your hospital stay. If you have any questions about your discharge medications or the care you received while you were in the hospital after you are discharged, you can call the unit and asked to speak with the hospitalist on call if the hospitalist that took care of you is not available. Once you are discharged, your primary care physician will handle any further medical issues. Please note that NO REFILLS for any discharge medications will be authorized once you are discharged, as it is imperative that you return to your primary care physician (or establish a relationship with a primary care physician if you do not have one) for your aftercare needs so that they can reassess your need for medications and monitor your lab values.

## 2016-06-18 NOTE — Discharge Planning (Signed)
Pt given discharge instructions, prescriptions and follow up info. Denies questions. Neighbor to drive pt home. Home health set up for pt by case management.

## 2016-06-18 NOTE — Discharge Summary (Signed)
[name] [name]:[id number] DOB: 06/23/1927 DOA: 06/13/2016  PCP: Burnis Medin, MD  Admit date: 06/13/2016  Discharge date: 06/18/2016  Admitted From: Home  Disposition:  Home - refused SNF   Recommendations for Outpatient Follow-up:   Follow up with PCP in 1-2 weeks  PCP Please obtain BMP/CBC, 2 view CXR in 1week,  (see Discharge instructions)   PCP Please follow up on the following pending results: None   Home Health: PT, RN   Equipment/Devices: None  Consultations: None Discharge Condition: Fair   CODE STATUS: DNR   Diet Recommendation:  Heart Healthy    Chief Complaint  Patient presents with  . Fever     Brief history of present illness from the day of admission and additional interim summary    [name] C Gianniniis a 83 y.o.femalewith past mental history significant for coronary artery disease hypertension hypothyroid peripheral vascular COPD disease presents to the hospital with chief complaint of shortness of breath. Patient acutely began to have short of breath today. She did not have any of her breathing medication at home. She'll let a neighbor know what was going on. Transportto the hospital was organized.                                                                 Hospital Course     Acute hypoxic respiratory failure due to [facility] acquired pneumonia with mild COPD exacerbation. She was treated with empiric antibiotics along with supportive care with oxygen and nebulizer treatment, she is much better this morning, chest x-ray is cleared, she still has mild cough but no shortness of breath, no oxygen demand, she has been transitioned on oral doxycycline since yesterday by previous M.D. which will be continued for another 3 days post discharge. She will be placed on Medrol  Dosepak steroid taper. Flutter valve has been added for pulmonary toiletry and she has been instructed to take it home and use it 2-3 times every hour while awake, request PCP to monitor her clinically post discharge. Note patient is quite weak and frail and did qualify for SNF but she refused placement. Home PT and RN will be provided.  Hypertension. Continue home medications unchanged. No acute issue.  CAD. On aspirin, Imdur, beta blocker for secondary prevention. No chest pain no acute issues.  Chronic pain. Home medications continued.  Since falls and right clavicular fracture which happened about 10 days ago. As above qualified for SNF but refused, continue previous plan of care.   Discharge diagnosis     Active Problems:   Respiratory failure with hypoxia (HCC)   Lobar pneumonia ([place])    Discharge instructions    Discharge Instructions    Diet - low sodium heart healthy    Complete by:  As directed    Discharge  instructions    Complete by:  As directed    Follow with Primary MD [name], Standley Brooking, MD in 7 days   Get CBC, CMP, 2 view Chest X ray checked  by Primary MD or SNF MD in 5-7 days ( we routinely change or add medications that can affect your baseline labs and fluid status, therefore we recommend that you get the mentioned basic workup next visit with your PCP, your PCP may decide not to get them or add new tests based on their clinical decision)  Activity: As tolerated with Full fall precautions use walker/cane & assistance as needed  Disposition Home    Diet:  Heart Healthy    For Heart failure patients - Check your Weight same time everyday, if you gain over 2 pounds, or you develop in leg swelling, experience more shortness of breath or chest pain, call your Primary MD immediately. Follow Cardiac Low Salt Diet and 1.5 lit/day fluid restriction.  On your next visit with your primary care physician please Get Medicines reviewed and adjusted.  Please request your  Prim.MD to go over all Hospital Tests and Procedure/Radiological results at the follow up, please get all Hospital records sent to your Prim MD by signing hospital release before you go home.  If you experience worsening of your admission symptoms, develop shortness of breath, life threatening emergency, suicidal or homicidal thoughts you must seek medical attention immediately by calling 911 or calling your MD immediately  if symptoms less severe.  You Must read complete instructions/literature along with all the possible adverse reactions/side effects for all the Medicines you take and that have been prescribed to you. Take any new Medicines after you have completely understood and accpet all the possible adverse reactions/side effects.   Do not drive, operate heavy machinery, perform activities at heights, swimming or participation in water activities or provide baby sitting services if your were admitted for syncope or siezures until you have seen by Primary MD or a Neurologist and advised to do so again.  Do not drive when taking Pain medications.    Do not take more than prescribed Pain, Sleep and Anxiety Medications  Special Instructions: If you have smoked or chewed Tobacco  in the last 2 yrs please stop smoking, stop any regular Alcohol  and or any Recreational drug use.  Wear Seat belts while driving.   Please note  You were cared for by a hospitalist during your hospital stay. If you have any questions about your discharge medications or the care you received while you were in the hospital after you are discharged, you can call the unit and asked to speak with the hospitalist on call if the hospitalist that took care of you is not available. Once you are discharged, your primary care physician will handle any further medical issues. Please note that NO REFILLS for any discharge medications will be authorized once you are discharged, as it is imperative that you return to your primary  care physician (or establish a relationship with a primary care physician if you do not have one) for your aftercare needs so that they can reassess your need for medications and monitor your lab values.   Increase activity slowly    Complete by:  As directed       Discharge Medications   Allergies as of 06/18/2016      Reactions   Statins Other (See Comments)   Elevated liver enzymes   Oxycodone Anxiety, Other (See Comments)  Also states, "It makes me feel like a zombie"   Albuterol Other (See Comments)   Shakes    Hydrocodone-acetaminophen Nausea Only   Dizziness   Tequin Other (See Comments)   Patient doesn't recall      Medication List    TAKE these medications   acetaminophen 500 MG tablet Commonly known as:  TYLENOL Take 1 tablet (500 mg total) by mouth every 6 (six) hours as needed. What changed:  reasons to take this   amLODipine 10 MG tablet Commonly known as:  NORVASC Take 1 tablet (10 mg total) by mouth daily.   aspirin 81 MG tablet Take 81 mg by mouth daily.   carboxymethylcellulose 0.5 % Soln Commonly known as:  REFRESH PLUS Place 1 drop into both eyes 4 (four) times daily.   doxycycline 100 MG tablet Commonly known as:  VIBRA-TABS Take 1 tablet (100 mg total) by mouth every 12 (twelve) hours.   fish oil-omega-3 fatty acids 1000 MG capsule Take 1 g by mouth daily.   isosorbide mononitrate 30 MG 24 hr tablet Commonly known as:  IMDUR Take 1 tablet (30 mg total) by mouth daily.   levalbuterol 0.63 MG/3ML nebulizer solution Commonly known as:  XOPENEX Take 0.63 mg by nebulization every 4 (four) hours as needed for wheezing or shortness of breath.   levalbuterol 45 MCG/ACT inhaler Commonly known as:  XOPENEX HFA INHALE 2 PUFFS BY MOUTH EVERY 6 HOURS AS NEEDED FOR WHEEZING   methylPREDNISolone 4 MG Tbpk tablet Commonly known as:  MEDROL DOSEPAK follow package directions   metoprolol succinate 25 MG 24 hr tablet Commonly known as:   TOPROL-XL Take 1 tablet (25 mg total) by mouth daily.   SPIRIVA HANDIHALER 18 MCG inhalation capsule Generic drug:  tiotropium INHALE THE CONTENTS OF 1 CAPSULE VIA INHALATION DEVICE EVERY DAY   traMADol 50 MG tablet Commonly known as:  ULTRAM Take 1 tablet (50 mg total) by mouth every 6 (six) hours as needed. What changed:  reasons to take this   Vitamin D 1000 units capsule Take 1,000 Units by mouth daily.       Follow-up Information    [facility].   Contact information: [address] [place] [address] [phone]        Burnis Medin, MD. Schedule an appointment as soon as possible for a visit in 1 week(s).   Specialties:  Internal Medicine, Pediatrics Contact information: [place] [address] [phone]           Major procedures and Radiology Reports - PLEASE review detailed and final reports thoroughly  -         Dg Chest Port 1 View  Result Date: 06/18/2016 CLINICAL DATA:  Shortness of breath, cough and congestion. EXAM: PORTABLE CHEST 1 VIEW COMPARISON:  06/13/2016 and prior radiographs FINDINGS: The cardiomediastinal silhouette is unchanged with upper limits normal heart size. Bibasilar aeration has slightly improved. A small chronic right pleural effusion appears unchanged from 2017 CT. There is no evidence of pneumothorax. A distal right clavicle fracture is again identified. No other significant change. IMPRESSION: Improved bibasilar aeration without other significant change. Electronically Signed   By: Margarette Canada M.D.   On: 06/18/2016 07:22   Dg Chest Portable 1 View  Result Date: 06/13/2016 CLINICAL DATA:  Fever, decreased p.o. intake.  Pneumonia? EXAM: PORTABLE CHEST 1 VIEW COMPARISON:  Chest x-rays dated 06/08/2016, 04/01/2016 and 11/12/2015. FINDINGS: New ill-defined airspace opacity at the  right lung base, highly suggestive of pneumonia. Lungs otherwise clear. No pleural effusion  or pneumothorax seen. Heart size is stable. Atherosclerotic changes noted at the aortic arch. IMPRESSION: Right lower lobe pneumonia. Aortic atherosclerosis. Electronically Signed   By: Franki Cabot M.D.   On: 06/13/2016 13:45    Micro Results     Recent Results (from the past 240 hour(s))  Culture, blood (Routine x 2)     Status: None (Preliminary result)   Collection Time: 06/13/16 12:52 PM  Result Value Ref Range Status   Specimen Description BLOOD RIGHT FOREARM  Final   Special Requests IN PEDIATRIC BOTTLE Blood Culture adequate volume  Final   Culture NO GROWTH 4 DAYS  Final   Report Status PENDING  Incomplete  Culture, blood (Routine x 2)     Status: None (Preliminary result)   Collection Time: 06/13/16  1:55 PM  Result Value Ref Range Status   Specimen Description BLOOD LEFT ARM  Final   Special Requests   Final    BOTTLES DRAWN AEROBIC AND ANAEROBIC Blood Culture adequate volume   Culture NO GROWTH 4 DAYS  Final   Report Status PENDING  Incomplete  MRSA PCR Screening     Status: None   Collection Time: 06/14/16 11:36 AM  Result Value Ref Range Status   MRSA by PCR NEGATIVE NEGATIVE Final    Comment:        The GeneXpert MRSA Assay (FDA approved for NASAL specimens only), is one component of a comprehensive MRSA colonization surveillance program. It is not intended to diagnose MRSA infection nor to guide or monitor treatment for MRSA infections.     Today   Subjective    [name] [name] today has no headache,no chest abdominal pain,no new weakness tingling or numbness, feels much better wants to go home today.     Objective   Blood pressure (!) 129/54, pulse 68, temperature 98.2 F (36.8 C), temperature source Oral, resp. rate 18, height 5\' 2"  (1.575 m), weight 45.7 kg (100 lb 12.8 oz), SpO2 99 %.   Intake/Output Summary (Last 24 hours) at 06/18/16 1144 Last data filed at 06/18/16 0900  Gross per 24 hour  Intake              480 ml  Output                 0 ml  Net              480 ml    Exam Awake Alert, Oriented x 3, No new F.N deficits, Normal affect [place].AT,PERRAL Supple Neck,No JVD, No cervical lymphadenopathy appriciated.  Symmetrical Chest wall movement, Good air movement bilaterally, CTAB RRR,No Gallops,Rubs or new Murmurs, No Parasternal Heave +ve B.Sounds, Abd Soft, Non tender, No organomegaly appriciated, No rebound -guarding or rigidity. No Cyanosis, Clubbing or edema, No new Rash or bruise   Data Review   CBC w Diff: Lab Results  Component Value Date   WBC 12.1 (H) 06/15/2016   HGB 11.3 (L) 06/15/2016   HGB 13.1 08/30/2005   HCT 36.1 06/15/2016   HCT 38.5 08/30/2005   PLT 228 06/15/2016   PLT 203 08/30/2005   LYMPHOPCT 12 06/13/2016   LYMPHOPCT 35.1 08/30/2005   MONOPCT 11 06/13/2016   MONOPCT 7.2 08/30/2005   EOSPCT 0 06/13/2016   EOSPCT 0.7 08/30/2005   BASOPCT 0 06/13/2016   BASOPCT 0.5 08/30/2005    CMP: Lab Results  Component Value Date   NA 141 06/15/2016  K 4.2 06/15/2016   CL 107 06/15/2016   CO2 26 06/15/2016   BUN 38 (H) 06/15/2016   CREATININE 1.15 (H) 06/15/2016   CREATININE 1.34 (H) 11/26/2015   PROT 6.1 (L) 06/13/2016   ALBUMIN 2.5 (L) 06/13/2016   BILITOT 1.0 06/13/2016   ALKPHOS 173 (H) 06/13/2016   AST 39 06/13/2016   ALT 26 06/13/2016  .   Total Time in preparing paper work, data evaluation and todays exam - 34 minutes  Lala Lund M.D on 06/18/2016 at 11:44 AM  [facility]   Office  [phone]

## 2016-06-18 NOTE — Progress Notes (Signed)
Physical Therapy Treatment Patient Details Name: [name] [name] MRN: [id number] DOB: 10-28-1927 Today's Date: 06/18/2016    History of Present Illness [name] [name] is a 83 y.o. female  with past mental history significant for coronary artery disease hypertension hypothyroid peripheral vascular COPD disease presents to the hospital with chief complaint of shortness of breath.  REcent falls with R distal clavicular fx (ok to use for ADLs, no lifting over 5 pounds), and pelvic fxs treated non-operatively    PT Comments    Pt demonstrates improved ability to perform transfers and bed mobs with decreased assistance, but is not able to perform gait as far this session due to increased pain in right hip. Pt continues to require PT follow-up acutely and at discharge in order to improve her functional outcomes to return to prior level of function.     Follow Up Recommendations  Home health PT;Supervision/Assistance - 24 hour;Other (comment)     Equipment Recommendations  None recommended by PT    Recommendations for Other Services       Precautions / Restrictions Precautions Precautions: Fall Precaution Comments: Fall prior to admission Restrictions Weight Bearing Restrictions: No    Mobility  Bed Mobility Overal bed mobility: Needs Assistance Bed Mobility: Sit to Supine       Sit to supine: Min guard   General bed mobility comments: min guard to bring Le's into bed.   Transfers Overall transfer level: Needs assistance Equipment used: Rolling walker (2 wheeled) Transfers: Sit to/from Stand Sit to Stand: Min guard         General transfer comment: Min guard for safety this session from recliner  Ambulation/Gait Ambulation/Gait assistance: Min guard Ambulation Distance (Feet): 50 Feet Assistive device: Rolling walker (2 wheeled) Gait Pattern/deviations: Decreased step length - right;Decreased step length - left;Antalgic Gait velocity: decreased Gait  velocity interpretation: Below normal speed for age/gender General Gait Details: slight decrease in distance tolerance due to pain in right hip this session. Pt continues to have slow cadence   Stairs            Wheelchair Mobility    Modified Rankin (Stroke Patients Only)       Balance                                            Cognition Arousal/Alertness: Awake/alert Behavior During Therapy: WFL for tasks assessed/performed Overall Cognitive Status: Within Functional Limits for tasks assessed                                        Exercises      General Comments        Pertinent Vitals/Pain Pain Assessment: Faces Faces Pain Scale: Hurts even more Pain Location: hips and back with gait Pain Descriptors / Indicators: Sore Pain Intervention(s): Monitored during session;Premedicated before session;Repositioned    Home Living                      Prior Function            PT Goals (current goals can now be found in the care plan section) Acute Rehab PT Goals Patient Stated Goal: go home Progress towards PT goals: Progressing toward goals    Frequency    Min 3X/week  PT Plan Current plan remains appropriate    Co-evaluation              AM-PAC PT "6 Clicks" Daily Activity  Outcome Measure  Difficulty turning over in bed (including adjusting bedclothes, sheets and blankets)?: Total Difficulty moving from lying on back to sitting on the side of the bed? : Total Difficulty sitting down on and standing up from a chair with arms (e.g., wheelchair, bedside commode, etc,.)?: A Little Help needed moving to and from a bed to chair (including a wheelchair)?: A Little Help needed walking in hospital room?: A Little Help needed climbing 3-5 steps with a railing? : A Lot 6 Click Score: 13    End of Session Equipment Utilized During Treatment: Gait belt Activity Tolerance: Patient tolerated treatment  well Patient left: in bed;with call bell/phone within reach Nurse Communication: Mobility status PT Visit Diagnosis: Unsteadiness on feet (R26.81);Other abnormalities of gait and mobility (R26.89);Pain Pain - Right/Left: Right Pain - part of body: Shoulder     Time: 1700-1749 PT Time Calculation (min) (ACUTE ONLY): 16 min  Charges:  $Gait Training: 8-22 mins                    G Codes:       Scheryl Marten PT, DPT  [phone]    Jacqulyn Liner Sloan Leiter 06/18/2016, 12:43 PM

## 2016-06-18 NOTE — Care Management Note (Addendum)
Case Management Note  Patient Details  Name: [name] [name] MRN: [id number] Date of Birth: November 30, 1927  Subjective/Objective:                 Spoke with patient at the bedside she states she has RW WC BSC at home. She states she has used [place] in th epast and "loved" them. Referral made to [facility] for [facility]. Patient states she lives at home alone, and gets from family/ friends for transportation. No other CM needs identified at this time.  [facility] would not accept patient back, patient aware, would like to try [facility], referral made and patient accepted.    Action/Plan:   Expected Discharge Date:                  Expected Discharge Plan:  [place]  In-House Referral:     Discharge planning Services  CM Consult  Post Acute Care Choice:  Home Health Choice offered to:  Patient  DME Arranged:    DME Agency:     HH Arranged:  PT HH Agency:  [facility]  Status of Service:  In process, will continue to follow  If discussed at Long Length of Stay Meetings, dates discussed:    Additional Comments:  Carles Collet, RN 06/18/2016, 10:20 AM

## 2016-06-21 ENCOUNTER — Telehealth: Payer: Self-pay

## 2016-06-21 DIAGNOSIS — I2510 Atherosclerotic heart disease of native coronary artery without angina pectoris: Secondary | ICD-10-CM | POA: Diagnosis not present

## 2016-06-21 DIAGNOSIS — I10 Essential (primary) hypertension: Secondary | ICD-10-CM | POA: Diagnosis not present

## 2016-06-21 DIAGNOSIS — Z7982 Long term (current) use of aspirin: Secondary | ICD-10-CM | POA: Diagnosis not present

## 2016-06-21 DIAGNOSIS — J181 Lobar pneumonia, unspecified organism: Secondary | ICD-10-CM | POA: Diagnosis not present

## 2016-06-21 DIAGNOSIS — J440 Chronic obstructive pulmonary disease with acute lower respiratory infection: Secondary | ICD-10-CM | POA: Diagnosis not present

## 2016-06-21 DIAGNOSIS — J441 Chronic obstructive pulmonary disease with (acute) exacerbation: Secondary | ICD-10-CM | POA: Diagnosis not present

## 2016-06-21 DIAGNOSIS — S42001D Fracture of unspecified part of right clavicle, subsequent encounter for fracture with routine healing: Secondary | ICD-10-CM | POA: Diagnosis not present

## 2016-06-21 DIAGNOSIS — Z7951 Long term (current) use of inhaled steroids: Secondary | ICD-10-CM | POA: Diagnosis not present

## 2016-06-21 DIAGNOSIS — Z9181 History of falling: Secondary | ICD-10-CM | POA: Diagnosis not present

## 2016-06-21 DIAGNOSIS — G8929 Other chronic pain: Secondary | ICD-10-CM | POA: Diagnosis not present

## 2016-06-21 NOTE — Progress Notes (Signed)
Chief Complaint  Patient presents with  . Hospitalization Follow-up    HPI: [name] [name] 83 y.o. come in for fu  Another hospitalization  DC 5/18 for   For rll pna   After fa;;s   Clavicular fracture she is brought in today by a friend with arise. Her daughter was visiting and had to leave town yesterday. She was treated with antibiotics finished doxycycline as an outpatient and was given medical prednisolone Medrol pack. Daughter had her stop the medicine because after 2 doses she was hallucinating. Stop the medicine and her mental status improved. She is still coughing and wheezing and states that she can't get her mucus up. No fever Isn't driving at this time. Asked to refill nitroglycerin and has been not using her nebulizer since she's been home.  She has   Copd and vascular disasea lives [facility]    .  continuet some tobacco  ROS: See pertinent positives and negatives per HPI. No vomitng bleeding   Diarrhea  reso of ros no change   Past Medical History:  Diagnosis Date  . CARCINOMA, BASAL CELL 02/13/2006  . CAROTID ARTERY DISEASE 10/03/2006   Carotid US 11/17: Stable 40-59% bilateral ICA stenosis >> f/u 1 year  . CHRONIC OBSTRUCTIVE PULMONARY DISEASE, ACUTE EXACERBATION 11/17/2009  . Chronic rhinitis 12/13/2006  . CORONARY ARTERY DISEASE 02/13/2006  . DEGENERATIVE JOINT DISEASE 09/27/2006  . DERMATITIS 09/25/2007  . Head trauma    closed  . HYPERLIPIDEMIA 03/10/2008  . HYPERTENSION 02/13/2006  . HYPOTHYROIDISM 01/30/2007  . LIVER FUNCTION TESTS, ABNORMAL 01/22/2009  . LOSS, HEARING NOS 08/25/2006  . Near syncope 09/18/2014  . NEOPLASM, SKIN, UNCERTAIN BEHAVIOR 22/29/7989  . NEPHROLITHIASIS, HX OF 02/27/2010  . OSTEOARTHRITIS, HAND 12/16/2008  . PERIPHERAL VASCULAR DISEASE 02/13/2006  . Personal history of malignant neoplasm of breast 07/24/2008  . Personal history of urinary calculi   . Post-traumatic wound infection 12/01/2010   Mild streaking  superiorly and on one day of septra  Will give rocephin and add keflex  adn follow up   . POSTHERPETIC NEURALGIA 08/25/2006  . RENAL ARTERY STENOSIS 08/01/2008  . Shingles    recurrent  . SPINAL STENOSIS 11/01/2006  . THROMBOCYTOPENIA 07/30/2008  . UNS ADVRS EFF UNS RX MEDICINAL&BIOLOGICAL SBSTNC 01/30/2007  . UNSPECIFIED ANEMIA 03/18/2008  . Unspecified vitamin D deficiency 02/07/2007  . UNSTABLE ANGINA 12/12/2009  . Vitreous detachment     Family History  Problem Relation Age of Onset  . Breast cancer Daughter   . Tuberculosis Father     Social History   Social History  . Marital status: Widowed    Spouse name: N/A  . Number of children: N/A  . Years of education: 36   Occupational History  . retired    Social History Main Topics  . Smoking status: Former Smoker    Packs/day: 1.00    Years: 30.00    Types: Cigarettes    Quit date: 06/15/2010  . Smokeless tobacco: Never Used     Comment: 2-3 cigs per week/// 06/30/12  . Alcohol use No     Comment: socially  . Drug use: No  . Sexual activity: Not Asked   Other Topics Concern  . None   Social History Narrative   Widowed   5 hours of sleep   Has friends checking on her   No Pets      Patient drinks about 6 cups of caffeine daily.   Patient is right  handed.    Outpatient Medications Prior to Visit  Medication Sig Dispense Refill  . acetaminophen (TYLENOL) 500 MG tablet Take 1 tablet (500 mg total) by mouth every 6 (six) hours as needed. (Patient taking differently: Take 500 mg by mouth every 6 (six) hours as needed for moderate pain or headache. ) 30 tablet 0  . amLODipine (NORVASC) 10 MG tablet Take 1 tablet (10 mg total) by mouth daily. 90 tablet 1  . aspirin 81 MG tablet Take 81 mg by mouth daily.    . carboxymethylcellulose (REFRESH PLUS) 0.5 % SOLN Place 1 drop into both eyes 4 (four) times daily.    . Cholecalciferol (VITAMIN D) 1000 UNITS capsule Take 1,000 Units by mouth daily.      Marland Kitchen doxycycline  (VIBRA-TABS) 100 MG tablet Take 1 tablet (100 mg total) by mouth every 12 (twelve) hours. 6 tablet 0  . fish oil-omega-3 fatty acids 1000 MG capsule Take 1 g by mouth daily.     . isosorbide mononitrate (IMDUR) 30 MG 24 hr tablet Take 1 tablet (30 mg total) by mouth daily. 90 tablet 2  . levalbuterol (XOPENEX HFA) 45 MCG/ACT inhaler INHALE 2 PUFFS BY MOUTH EVERY 6 HOURS AS NEEDED FOR WHEEZING 45 g 4  . levalbuterol (XOPENEX) 0.63 MG/3ML nebulizer solution Take 0.63 mg by nebulization every 4 (four) hours as needed for wheezing or shortness of breath.    . methylPREDNISolone (MEDROL DOSEPAK) 4 MG TBPK tablet follow package directions 21 tablet 0  . metoprolol succinate (TOPROL-XL) 25 MG 24 hr tablet Take 1 tablet (25 mg total) by mouth daily. 90 tablet 2  . SPIRIVA HANDIHALER 18 MCG inhalation capsule INHALE THE CONTENTS OF 1 CAPSULE VIA INHALATION DEVICE EVERY DAY 30 capsule 5  . traMADol (ULTRAM) 50 MG tablet Take 1 tablet (50 mg total) by mouth every 6 (six) hours as needed. (Patient taking differently: Take 50 mg by mouth every 6 (six) hours as needed for moderate pain. ) 15 tablet 0   No facility-administered medications prior to visit.      EXAM:  BP 102/60 (BP Location: Right Arm, Patient Position: Sitting, Cuff Size: Normal)   Pulse (!) 55   Temp 98 F (36.7 C) (Oral)   Ht 5\' 2"  (1.575 m)   Wt 99 lb (44.9 kg)   SpO2 94%   BMI 18.11 kg/m   Body mass index is 18.11 kg/m.  GENERAL: vitals reviewed and listed above, alert, oriented,Frail very thin white female in no acute distress but was some intermittent coughing. Deep wet and loose. appears well hydrated HEENT: atraumatic, conjunctiva  clear, no obvious abnormalities on inspection of external nose and ears OP : no lesion edema or exudate  NECK: no obvious masses on inspection palpation  LUNGS: Minimal retractions diffuse rhonchi. Breath sounds present throughout. CV: HRRR, no clubbing cyanosis or  peripheral edema nl cap refill    MS: moves all extremities without noticeable focal  Abnormality Skin no acute findings no active bruising. Musculoskeletal right clavicle. Tender no bruising noted. PSYCH: pleasant and cooperative, appears to be cognitively intact. Lab Results  Component Value Date   WBC 12.1 (H) 06/15/2016   HGB 11.3 (L) 06/15/2016   HCT 36.1 06/15/2016   PLT 228 06/15/2016   GLUCOSE 139 (H) 06/15/2016   CHOL 160 08/02/2014   TRIG 57.0 08/02/2014   HDL 64.70 08/02/2014   LDLCALC 84 08/02/2014   ALT 26 06/13/2016   AST 39 06/13/2016   NA 141 06/15/2016  K 4.2 06/15/2016   CL 107 06/15/2016   CREATININE 1.15 (H) 06/15/2016   BUN 38 (H) 06/15/2016   CO2 26 06/15/2016   TSH 0.90 04/20/2016   INR 1.07 06/13/2016   HGBA1C 5.5 11/14/2015   BP Readings from Last 3 Encounters:  06/22/16 102/60  06/18/16 (!) 129/54  06/08/16 (!) 163/62   Wt Readings from Last 3 Encounters:  06/22/16 99 lb (44.9 kg)  06/17/16 100 lb 12.8 oz (45.7 kg)  04/20/16 95 lb 6.4 oz (43.3 kg)    ASSESSMENT AND PLAN:  Discussed the following assessment and plan:  Community acquired pneumonia of right lower lobe of lung (HCC)  Chronic obstructive pulmonary disease, unspecified COPD type (HCC)  Protein-calorie malnutrition, unspecified severity ([place])  Hx of falling, presenting hazards to health  Closed nondisplaced fracture of clavicle, unspecified laterality, unspecified part of clavicle, sequela  Medication side effect - Reported to have hallucinations with 1-2 doses of Medrol dose pack has been able to take prednisone in the past  Frailty  Advanced age  Underweight Recurrent falls frial osteoporosies  Are problematic w her  contintue tobacco and  Living alone . sghe has neighber support  Work on getting transportation ; daughter has filled fridge for  nutritional intake .   Med for  Osteoporosis prolia may need to be  Re initiated but for now her pulmonary status seems to be the most problematic. Advised  her to begin her nebulizer She apparently had CNS side effects from Medrol will put her on a prednisone taper for 7 days. We need to get her to follow up with pulmonary in the next 2-3 weeks follow-up x-ray as appropriate. She asked for refill of nitroglycerin however we'll have to send a message to cardiology as it appears to have been discontinued in the past.  Patient refused SNIF rehabilitation and hopefully will continue to improve as an outpatient. We discussed recurrent falls and prevention. Home health should be coming.  -Patient advised to return or notify health care team  if  new concerns arise. In the interim   Patient Instructions  Please start your  nebulizer  Treatments  and use every 4-6 hours is needed for wheezing and coughing. We can changeMedrol which caused hallucinations back to prednisone for the next week.  I can refill your nitroglycerin until cardiology can do this I want to do have a follow-up appointment with the pulmonary team in the next 2-3 weeks or if you're getting worse. Keep up your nutrition and hydration. Do not smoke tobacco as it will make it harder to cough up the phlegm.     Standley Brooking. Hilliard Borges M.D.

## 2016-06-21 NOTE — Telephone Encounter (Signed)
D/C 06/18/16 To: home  Spoke with pt and she states that she does not feel she is improving much. She is taking medications as directed. She is using flutter valve as directed. She has no questions or concerns.   Appt scheduled with Dr. Regis Bill - 06/22/16, pt aware. She is concerned about transportation to the appt. Spoke with pt's daughter and gave recommendations for non-emergent medical transportation companies. She will be calling them for possible transport.    Transition Care Management Follow-up Telephone Call  How have you been since you were released from the hospital? Not improving much, denies continued ShOB   Do you understand why you were in the hospital? yes   Do you understand the discharge instrcutions? yes  Items Reviewed:  Medications reviewed: yes  Allergies reviewed: yes  Dietary changes reviewed: yes  Referrals reviewed: yes   Functional Questionnaire:   Activities of Daily Living (ADLs):   She states they are independent in the following: ambulation, bathing and hygiene, feeding, continence, grooming, toileting and dressing States they require assistance with the following: driving   Any transportation issues/concerns?: yes   Any patient concerns? no   Confirmed importance and date/time of follow-up visits scheduled: yes   Confirmed with patient if condition begins to worsen call PCP or go to the ER.  Patient was given the Call-a-Nurse line [phone]: yes

## 2016-06-22 ENCOUNTER — Telehealth: Payer: Self-pay | Admitting: Internal Medicine

## 2016-06-22 ENCOUNTER — Encounter: Payer: Self-pay | Admitting: Internal Medicine

## 2016-06-22 ENCOUNTER — Telehealth: Payer: Self-pay

## 2016-06-22 ENCOUNTER — Ambulatory Visit (INDEPENDENT_AMBULATORY_CARE_PROVIDER_SITE_OTHER): Payer: Medicare HMO | Admitting: Internal Medicine

## 2016-06-22 VITALS — BP 102/60 | HR 55 | Temp 98.0°F | Ht 62.0 in | Wt 99.0 lb

## 2016-06-22 DIAGNOSIS — T887XXA Unspecified adverse effect of drug or medicament, initial encounter: Secondary | ICD-10-CM | POA: Diagnosis not present

## 2016-06-22 DIAGNOSIS — S42026S Nondisplaced fracture of shaft of unspecified clavicle, sequela: Secondary | ICD-10-CM | POA: Diagnosis not present

## 2016-06-22 DIAGNOSIS — S42001D Fracture of unspecified part of right clavicle, subsequent encounter for fracture with routine healing: Secondary | ICD-10-CM | POA: Diagnosis not present

## 2016-06-22 DIAGNOSIS — J449 Chronic obstructive pulmonary disease, unspecified: Secondary | ICD-10-CM

## 2016-06-22 DIAGNOSIS — R54 Age-related physical debility: Secondary | ICD-10-CM

## 2016-06-22 DIAGNOSIS — J441 Chronic obstructive pulmonary disease with (acute) exacerbation: Secondary | ICD-10-CM | POA: Diagnosis not present

## 2016-06-22 DIAGNOSIS — I10 Essential (primary) hypertension: Secondary | ICD-10-CM | POA: Diagnosis not present

## 2016-06-22 DIAGNOSIS — G8929 Other chronic pain: Secondary | ICD-10-CM | POA: Diagnosis not present

## 2016-06-22 DIAGNOSIS — Z9181 History of falling: Secondary | ICD-10-CM

## 2016-06-22 DIAGNOSIS — I2510 Atherosclerotic heart disease of native coronary artery without angina pectoris: Secondary | ICD-10-CM | POA: Diagnosis not present

## 2016-06-22 DIAGNOSIS — R636 Underweight: Secondary | ICD-10-CM

## 2016-06-22 DIAGNOSIS — S42009S Fracture of unspecified part of unspecified clavicle, sequela: Secondary | ICD-10-CM

## 2016-06-22 DIAGNOSIS — Z7951 Long term (current) use of inhaled steroids: Secondary | ICD-10-CM | POA: Diagnosis not present

## 2016-06-22 DIAGNOSIS — E46 Unspecified protein-calorie malnutrition: Secondary | ICD-10-CM

## 2016-06-22 DIAGNOSIS — Z7982 Long term (current) use of aspirin: Secondary | ICD-10-CM | POA: Diagnosis not present

## 2016-06-22 DIAGNOSIS — J440 Chronic obstructive pulmonary disease with acute lower respiratory infection: Secondary | ICD-10-CM | POA: Diagnosis not present

## 2016-06-22 DIAGNOSIS — J181 Lobar pneumonia, unspecified organism: Secondary | ICD-10-CM | POA: Diagnosis not present

## 2016-06-22 DIAGNOSIS — J189 Pneumonia, unspecified organism: Secondary | ICD-10-CM

## 2016-06-22 MED ORDER — PREDNISONE 10 MG PO TABS: ORAL_TABLET | ORAL | 0 refills | Status: DC

## 2016-06-22 NOTE — Telephone Encounter (Signed)
Please advise 

## 2016-06-22 NOTE — Patient Instructions (Addendum)
Please start your  nebulizer  Treatments  and use every 4-6 hours is needed for wheezing and coughing. We can changeMedrol which caused hallucinations back to prednisone for the next week.  I can refill your nitroglycerin until cardiology can do this I want to do have a follow-up appointment with the pulmonary team in the next 2-3 weeks or if you're getting worse. Keep up your nutrition and hydration. Do not smoke tobacco as it will make it harder to cough up the phlegm.

## 2016-06-22 NOTE — Telephone Encounter (Signed)
Ok to proceed  With this order

## 2016-06-22 NOTE — Telephone Encounter (Signed)
° ° °  Frankie a PT with [facility] call to ask for verbal orders for the below    2  Times a week for 3 weeks post hosp authorization orders she said    [phone]

## 2016-06-22 NOTE — Telephone Encounter (Signed)
Please see if pt taking imdur; if so continue and refill; refill ntg Kirk Ruths

## 2016-06-22 NOTE — Telephone Encounter (Signed)
Patient was seen today by Dr. Regis Bill for a post-hospital follow-up and asked for a refill on her nitroglycerin. Per chart notes this was d/c'd 04/05/16. Pt is also on Imdur. Please advise and/or refill medication as you would recommend.   Dr. Stanford Breed - Please refill or advise. Thanks!

## 2016-06-23 ENCOUNTER — Telehealth: Payer: Self-pay | Admitting: Internal Medicine

## 2016-06-23 MED ORDER — ISOSORBIDE MONONITRATE ER 30 MG PO TB24: 30.0000 mg | ORAL_TABLET | Freq: Every day | ORAL | 2 refills | Status: DC

## 2016-06-23 MED ORDER — NITROGLYCERIN 0.4 MG SL SUBL: 0.4000 mg | SUBLINGUAL_TABLET | SUBLINGUAL | 12 refills | Status: DC | PRN

## 2016-06-23 MED ORDER — NYSTATIN 100000 UNIT/ML MT SUSP: 5.0000 mL | Freq: Four times a day (QID) | OROMUCOSAL | 1 refills | Status: DC

## 2016-06-23 NOTE — Telephone Encounter (Signed)
Spoke with pt, she is only wanting the NTG if she needs it. She denies any problems. Refill sent to the pharmacy electronically.

## 2016-06-23 NOTE — Telephone Encounter (Signed)
Please advise 

## 2016-06-23 NOTE — Telephone Encounter (Signed)
I sent in nystatin   Liquid   For 14 days  Let us know if not getting   Better after a week

## 2016-06-23 NOTE — Telephone Encounter (Signed)
Pt calling to see if something can be called in for thrush that she has in her mouth and state that she mentioned it on yesterday but did not get anything for it.  Pharm:  [facility]

## 2016-06-23 NOTE — Telephone Encounter (Signed)
Sp[facility] with [facility] and per Dr. Regis Bill okay to proceed with orders. Nothing further needed

## 2016-06-24 DIAGNOSIS — I2510 Atherosclerotic heart disease of native coronary artery without angina pectoris: Secondary | ICD-10-CM | POA: Diagnosis not present

## 2016-06-24 DIAGNOSIS — I10 Essential (primary) hypertension: Secondary | ICD-10-CM | POA: Diagnosis not present

## 2016-06-24 DIAGNOSIS — Z7951 Long term (current) use of inhaled steroids: Secondary | ICD-10-CM | POA: Diagnosis not present

## 2016-06-24 DIAGNOSIS — S42001D Fracture of unspecified part of right clavicle, subsequent encounter for fracture with routine healing: Secondary | ICD-10-CM | POA: Diagnosis not present

## 2016-06-24 DIAGNOSIS — Z9181 History of falling: Secondary | ICD-10-CM | POA: Diagnosis not present

## 2016-06-24 DIAGNOSIS — Z7982 Long term (current) use of aspirin: Secondary | ICD-10-CM | POA: Diagnosis not present

## 2016-06-24 DIAGNOSIS — J181 Lobar pneumonia, unspecified organism: Secondary | ICD-10-CM | POA: Diagnosis not present

## 2016-06-24 DIAGNOSIS — J440 Chronic obstructive pulmonary disease with acute lower respiratory infection: Secondary | ICD-10-CM | POA: Diagnosis not present

## 2016-06-24 DIAGNOSIS — J441 Chronic obstructive pulmonary disease with (acute) exacerbation: Secondary | ICD-10-CM | POA: Diagnosis not present

## 2016-06-24 DIAGNOSIS — G8929 Other chronic pain: Secondary | ICD-10-CM | POA: Diagnosis not present

## 2016-06-24 NOTE — Telephone Encounter (Signed)
Pt is aware and will contact the office if not better

## 2016-06-25 DIAGNOSIS — G8929 Other chronic pain: Secondary | ICD-10-CM | POA: Diagnosis not present

## 2016-06-25 DIAGNOSIS — I10 Essential (primary) hypertension: Secondary | ICD-10-CM | POA: Diagnosis not present

## 2016-06-25 DIAGNOSIS — J181 Lobar pneumonia, unspecified organism: Secondary | ICD-10-CM | POA: Diagnosis not present

## 2016-06-25 DIAGNOSIS — J440 Chronic obstructive pulmonary disease with acute lower respiratory infection: Secondary | ICD-10-CM | POA: Diagnosis not present

## 2016-06-25 DIAGNOSIS — Z7982 Long term (current) use of aspirin: Secondary | ICD-10-CM | POA: Diagnosis not present

## 2016-06-25 DIAGNOSIS — Z9181 History of falling: Secondary | ICD-10-CM | POA: Diagnosis not present

## 2016-06-25 DIAGNOSIS — I2510 Atherosclerotic heart disease of native coronary artery without angina pectoris: Secondary | ICD-10-CM | POA: Diagnosis not present

## 2016-06-25 DIAGNOSIS — J441 Chronic obstructive pulmonary disease with (acute) exacerbation: Secondary | ICD-10-CM | POA: Diagnosis not present

## 2016-06-25 DIAGNOSIS — S42001D Fracture of unspecified part of right clavicle, subsequent encounter for fracture with routine healing: Secondary | ICD-10-CM | POA: Diagnosis not present

## 2016-06-25 DIAGNOSIS — Z7951 Long term (current) use of inhaled steroids: Secondary | ICD-10-CM | POA: Diagnosis not present

## 2016-06-30 DIAGNOSIS — J441 Chronic obstructive pulmonary disease with (acute) exacerbation: Secondary | ICD-10-CM | POA: Diagnosis not present

## 2016-06-30 DIAGNOSIS — J181 Lobar pneumonia, unspecified organism: Secondary | ICD-10-CM | POA: Diagnosis not present

## 2016-06-30 DIAGNOSIS — Z7982 Long term (current) use of aspirin: Secondary | ICD-10-CM | POA: Diagnosis not present

## 2016-06-30 DIAGNOSIS — S42001D Fracture of unspecified part of right clavicle, subsequent encounter for fracture with routine healing: Secondary | ICD-10-CM | POA: Diagnosis not present

## 2016-06-30 DIAGNOSIS — J440 Chronic obstructive pulmonary disease with acute lower respiratory infection: Secondary | ICD-10-CM | POA: Diagnosis not present

## 2016-06-30 DIAGNOSIS — G8929 Other chronic pain: Secondary | ICD-10-CM | POA: Diagnosis not present

## 2016-06-30 DIAGNOSIS — Z9181 History of falling: Secondary | ICD-10-CM | POA: Diagnosis not present

## 2016-06-30 DIAGNOSIS — I10 Essential (primary) hypertension: Secondary | ICD-10-CM | POA: Diagnosis not present

## 2016-06-30 DIAGNOSIS — I2510 Atherosclerotic heart disease of native coronary artery without angina pectoris: Secondary | ICD-10-CM | POA: Diagnosis not present

## 2016-06-30 DIAGNOSIS — Z7951 Long term (current) use of inhaled steroids: Secondary | ICD-10-CM | POA: Diagnosis not present

## 2016-07-02 ENCOUNTER — Telehealth: Payer: Self-pay | Admitting: Internal Medicine

## 2016-07-02 DIAGNOSIS — J441 Chronic obstructive pulmonary disease with (acute) exacerbation: Secondary | ICD-10-CM | POA: Diagnosis not present

## 2016-07-02 DIAGNOSIS — Z9181 History of falling: Secondary | ICD-10-CM | POA: Diagnosis not present

## 2016-07-02 DIAGNOSIS — J440 Chronic obstructive pulmonary disease with acute lower respiratory infection: Secondary | ICD-10-CM | POA: Diagnosis not present

## 2016-07-02 DIAGNOSIS — Z7982 Long term (current) use of aspirin: Secondary | ICD-10-CM | POA: Diagnosis not present

## 2016-07-02 DIAGNOSIS — S42001D Fracture of unspecified part of right clavicle, subsequent encounter for fracture with routine healing: Secondary | ICD-10-CM | POA: Diagnosis not present

## 2016-07-02 DIAGNOSIS — I10 Essential (primary) hypertension: Secondary | ICD-10-CM | POA: Diagnosis not present

## 2016-07-02 DIAGNOSIS — G8929 Other chronic pain: Secondary | ICD-10-CM | POA: Diagnosis not present

## 2016-07-02 DIAGNOSIS — Z7951 Long term (current) use of inhaled steroids: Secondary | ICD-10-CM | POA: Diagnosis not present

## 2016-07-02 DIAGNOSIS — J181 Lobar pneumonia, unspecified organism: Secondary | ICD-10-CM | POA: Diagnosis not present

## 2016-07-02 DIAGNOSIS — I2510 Atherosclerotic heart disease of native coronary artery without angina pectoris: Secondary | ICD-10-CM | POA: Diagnosis not present

## 2016-07-02 NOTE — Telephone Encounter (Signed)
FYI Pt is refusing home physical therapy and will be discharge

## 2016-07-02 NOTE — Telephone Encounter (Signed)
FYI

## 2016-07-02 NOTE — Telephone Encounter (Signed)
noted 

## 2016-07-06 DIAGNOSIS — I10 Essential (primary) hypertension: Secondary | ICD-10-CM | POA: Diagnosis not present

## 2016-07-06 DIAGNOSIS — J441 Chronic obstructive pulmonary disease with (acute) exacerbation: Secondary | ICD-10-CM | POA: Diagnosis not present

## 2016-07-06 DIAGNOSIS — I2510 Atherosclerotic heart disease of native coronary artery without angina pectoris: Secondary | ICD-10-CM | POA: Diagnosis not present

## 2016-07-06 DIAGNOSIS — J440 Chronic obstructive pulmonary disease with acute lower respiratory infection: Secondary | ICD-10-CM | POA: Diagnosis not present

## 2016-07-06 DIAGNOSIS — Z7982 Long term (current) use of aspirin: Secondary | ICD-10-CM | POA: Diagnosis not present

## 2016-07-06 DIAGNOSIS — Z9181 History of falling: Secondary | ICD-10-CM | POA: Diagnosis not present

## 2016-07-06 DIAGNOSIS — G8929 Other chronic pain: Secondary | ICD-10-CM | POA: Diagnosis not present

## 2016-07-06 DIAGNOSIS — J181 Lobar pneumonia, unspecified organism: Secondary | ICD-10-CM | POA: Diagnosis not present

## 2016-07-06 DIAGNOSIS — S42001D Fracture of unspecified part of right clavicle, subsequent encounter for fracture with routine healing: Secondary | ICD-10-CM | POA: Diagnosis not present

## 2016-07-06 DIAGNOSIS — Z7951 Long term (current) use of inhaled steroids: Secondary | ICD-10-CM | POA: Diagnosis not present

## 2016-07-15 DIAGNOSIS — J441 Chronic obstructive pulmonary disease with (acute) exacerbation: Secondary | ICD-10-CM | POA: Diagnosis not present

## 2016-07-15 DIAGNOSIS — Z7982 Long term (current) use of aspirin: Secondary | ICD-10-CM | POA: Diagnosis not present

## 2016-07-15 DIAGNOSIS — Z7951 Long term (current) use of inhaled steroids: Secondary | ICD-10-CM | POA: Diagnosis not present

## 2016-07-15 DIAGNOSIS — G8929 Other chronic pain: Secondary | ICD-10-CM | POA: Diagnosis not present

## 2016-07-15 DIAGNOSIS — I10 Essential (primary) hypertension: Secondary | ICD-10-CM | POA: Diagnosis not present

## 2016-07-15 DIAGNOSIS — Z9181 History of falling: Secondary | ICD-10-CM | POA: Diagnosis not present

## 2016-07-15 DIAGNOSIS — S42001D Fracture of unspecified part of right clavicle, subsequent encounter for fracture with routine healing: Secondary | ICD-10-CM | POA: Diagnosis not present

## 2016-07-15 DIAGNOSIS — I2510 Atherosclerotic heart disease of native coronary artery without angina pectoris: Secondary | ICD-10-CM | POA: Diagnosis not present

## 2016-07-15 DIAGNOSIS — J181 Lobar pneumonia, unspecified organism: Secondary | ICD-10-CM | POA: Diagnosis not present

## 2016-07-15 DIAGNOSIS — J440 Chronic obstructive pulmonary disease with acute lower respiratory infection: Secondary | ICD-10-CM | POA: Diagnosis not present

## 2016-08-12 ENCOUNTER — Other Ambulatory Visit: Payer: Self-pay | Admitting: *Deleted

## 2016-08-13 MED ORDER — METOPROLOL SUCCINATE ER 25 MG PO TB24: 25.0000 mg | ORAL_TABLET | Freq: Every day | ORAL | 0 refills | Status: DC

## 2016-11-01 DIAGNOSIS — R69 Illness, unspecified: Secondary | ICD-10-CM | POA: Diagnosis not present

## 2016-11-18 ENCOUNTER — Other Ambulatory Visit: Payer: Self-pay | Admitting: Cardiology

## 2016-12-02 DIAGNOSIS — M5441 Lumbago with sciatica, right side: Secondary | ICD-10-CM | POA: Diagnosis not present

## 2016-12-02 DIAGNOSIS — G8929 Other chronic pain: Secondary | ICD-10-CM | POA: Diagnosis not present

## 2016-12-06 ENCOUNTER — Other Ambulatory Visit: Payer: Self-pay | Admitting: *Deleted

## 2016-12-06 ENCOUNTER — Telehealth: Payer: Self-pay | Admitting: Cardiology

## 2016-12-06 MED ORDER — AMLODIPINE BESYLATE 10 MG PO TABS: 10.0000 mg | ORAL_TABLET | Freq: Every day | ORAL | 0 refills | Status: DC

## 2016-12-06 NOTE — Telephone Encounter (Signed)
°*  STAT* If patient is at the pharmacy, call can be transferred to refill team.   1. Which medications need to be refilled? (please list name of each medication and dose if known) amlodipine 10 mg  2. Which pharmacy/location (including street and city if local pharmacy) is medication to be sent to? [id number] - [place], [place]  3. Do they need a 30 day or 90 day supply? [place]

## 2016-12-07 ENCOUNTER — Telehealth: Payer: Self-pay | Admitting: Cardiology

## 2016-12-07 MED ORDER — AMLODIPINE BESYLATE 10 MG PO TABS: 10.0000 mg | ORAL_TABLET | Freq: Every day | ORAL | 0 refills | Status: DC

## 2016-12-07 NOTE — Telephone Encounter (Signed)
Follow up scheduled and Refill sent to the pharmacy electronically.  

## 2016-12-07 NOTE — Telephone Encounter (Signed)
New message     *STAT* If patient is at the pharmacy, call can be transferred to refill team.   1. Which medications need to be refilled? (please list name of each medication and dose if known) amLODipine (NORVASC) 10 MG tablet  2. Which pharmacy/location (including street and city if local pharmacy) is medication to be sent to? [facility] on [address]  3. Do they need a 30 day or 90 day supply? [address]

## 2016-12-16 ENCOUNTER — Ambulatory Visit: Payer: Medicare HMO | Admitting: Physician Assistant

## 2016-12-28 DIAGNOSIS — C50911 Malignant neoplasm of unspecified site of right female breast: Secondary | ICD-10-CM | POA: Diagnosis not present

## 2017-01-01 DIAGNOSIS — M5441 Lumbago with sciatica, right side: Secondary | ICD-10-CM | POA: Diagnosis not present

## 2017-01-04 ENCOUNTER — Ambulatory Visit: Payer: Medicare HMO | Admitting: Physician Assistant

## 2017-01-04 NOTE — Progress Notes (Deleted)
Cardiology Office Note    Date:  01/04/2017   ID:  [name] [name], DOB May 16, 1927, MRN [id number]  PCP:  Burnis Medin, MD  Cardiologist:  Dr. Stanford Breed   No chief complaint on file.   History of Present Illness:  [name] [name] is a 83 y.o. female with PMH of carotid artery diease, HTN, HLD, hypothyroidism, PAD with occluded L SFA and CAD s/p stenting to RCA 2005.  Last cardiac catheterization in November 2011 showed 25% ostial D1, mild irregularities in LAD, 25% left circumflex, patent stent in mid RCA with 30% in-stent restenosis.  Last ABI in May 2016 which was 0.58 on the right and 0.66 on the left.  Echocardiogram obtained on 11/16/2015 showed EF 60-65%, grade 1 DD, mild MR, mild AI, trivial pericardial effusion, RVSP 43 mmHg.  Carotid Doppler obtained in November 2017 showed moderate disease bilaterally.  He was last seen by Dr. Stanford Breed on 01/15/2016, she was doing well at the time.  She returned in May 2018 to the hospital with COPD exacerbation.  She was treated with doxycycline oxygen, nebulizer treatment.  Yes EKG Carotid US  Past Medical History:  Diagnosis Date  . CARCINOMA, BASAL CELL 02/13/2006  . CAROTID ARTERY DISEASE 10/03/2006   Carotid US 11/17: Stable 40-59% bilateral ICA stenosis >> f/u 1 year  . CHRONIC OBSTRUCTIVE PULMONARY DISEASE, ACUTE EXACERBATION 11/17/2009  . Chronic rhinitis 12/13/2006  . CORONARY ARTERY DISEASE 02/13/2006  . DEGENERATIVE JOINT DISEASE 09/27/2006  . DERMATITIS 09/25/2007  . Head trauma    closed  . HYPERLIPIDEMIA 03/10/2008  . HYPERTENSION 02/13/2006  . HYPOTHYROIDISM 01/30/2007  . LIVER FUNCTION TESTS, ABNORMAL 01/22/2009  . LOSS, HEARING NOS 08/25/2006  . Near syncope 09/18/2014  . NEOPLASM, SKIN, UNCERTAIN BEHAVIOR 40/98/1191  . NEPHROLITHIASIS, HX OF 02/27/2010  . OSTEOARTHRITIS, HAND 12/16/2008  . PERIPHERAL VASCULAR DISEASE 02/13/2006  . Personal history of malignant neoplasm of breast 07/24/2008  . Personal history  of urinary calculi   . Post-traumatic wound infection 12/01/2010   Mild streaking superiorly and on one day of septra  Will give rocephin and add keflex  adn follow up   . POSTHERPETIC NEURALGIA 08/25/2006  . RENAL ARTERY STENOSIS 08/01/2008  . Shingles    recurrent  . SPINAL STENOSIS 11/01/2006  . THROMBOCYTOPENIA 07/30/2008  . UNS ADVRS EFF UNS RX MEDICINAL&BIOLOGICAL SBSTNC 01/30/2007  . UNSPECIFIED ANEMIA 03/18/2008  . Unspecified vitamin D deficiency 02/07/2007  . UNSTABLE ANGINA 12/12/2009  . Vitreous detachment     Past Surgical History:  Procedure Laterality Date  . ABDOMINAL HYSTERECTOMY    . BREAST LUMPECTOMY    . BREAST LUMPECTOMY  1998  . CATARACT EXTRACTION    . CORONARY ANGIOPLASTY WITH STENT PLACEMENT     bilat iliac stents, left renal artery and rt coronary artery last myoview 8/09 with EF 70%  . LUMBAR LAMINECTOMY    . SKIN CANCER EXCISION    . TONSILLECTOMY      Current Medications: Outpatient Medications Prior to Visit  Medication Sig Dispense Refill  . acetaminophen (TYLENOL) 500 MG tablet Take 1 tablet (500 mg total) by mouth every 6 (six) hours as needed. (Patient taking differently: Take 500 mg by mouth every 6 (six) hours as needed for moderate pain or headache. ) 30 tablet 0  . amLODipine (NORVASC) 10 MG tablet Take 1 tablet (10 mg total) daily by mouth. 90 tablet 0  . aspirin 81 MG tablet Take 81 mg by mouth daily.    Marland Kitchen  carboxymethylcellulose (REFRESH PLUS) 0.5 % SOLN Place 1 drop into both eyes 4 (four) times daily.    . Cholecalciferol (VITAMIN D) 1000 UNITS capsule Take 1,000 Units by mouth daily.      Marland Kitchen doxycycline (VIBRA-TABS) 100 MG tablet Take 1 tablet (100 mg total) by mouth every 12 (twelve) hours. 6 tablet 0  . fish oil-omega-3 fatty acids 1000 MG capsule Take 1 g by mouth daily.     . isosorbide mononitrate (IMDUR) 30 MG 24 hr tablet Take 1 tablet (30 mg total) by mouth daily. 90 tablet 2  . levalbuterol (XOPENEX HFA) 45 MCG/ACT inhaler INHALE 2  PUFFS BY MOUTH EVERY 6 HOURS AS NEEDED FOR WHEEZING 45 g 4  . levalbuterol (XOPENEX) 0.63 MG/3ML nebulizer solution Take 0.63 mg by nebulization every 4 (four) hours as needed for wheezing or shortness of breath.    . methylPREDNISolone (MEDROL DOSEPAK) 4 MG TBPK tablet follow package directions 21 tablet 0  . metoprolol succinate (TOPROL-XL) 25 MG 24 hr tablet TAKE 1 TABLET(25 MG) BY MOUTH DAILY 30 tablet 0  . nitroGLYCERIN (NITROSTAT) 0.4 MG SL tablet Place 1 tablet (0.4 mg total) under the tongue every 5 (five) minutes as needed for chest pain. 25 tablet 12  . nystatin (MYCOSTATIN) 100000 UNIT/ML suspension Take 5 mLs (500,000 Units total) by mouth 4 (four) times daily. Swish and swallow 280 mL 1  . predniSONE (DELTASONE) 10 MG tablet Take pills per day,,4,4,4,2,2,2,1,1,1 or as directed 40 tablet 0  . SPIRIVA HANDIHALER 18 MCG inhalation capsule INHALE THE CONTENTS OF 1 CAPSULE VIA INHALATION DEVICE EVERY DAY 30 capsule 5  . traMADol (ULTRAM) 50 MG tablet Take 1 tablet (50 mg total) by mouth every 6 (six) hours as needed. (Patient taking differently: Take 50 mg by mouth every 6 (six) hours as needed for moderate pain. ) 15 tablet 0   No facility-administered medications prior to visit.      Allergies:   Medrol [methylprednisolone]; Statins; Oxycodone; Albuterol; Hydrocodone-acetaminophen; and Tequin   Social History   Socioeconomic History  . Marital status: Widowed    Spouse name: Not on file  . Number of children: Not on file  . Years of education: 32  . Highest education level: Not on file  Social Needs  . Financial resource strain: Not on file  . Food insecurity - worry: Not on file  . Food insecurity - inability: Not on file  . Transportation needs - medical: Not on file  . Transportation needs - non-medical: Not on file  Occupational History  . Occupation: retired  Tobacco Use  . Smoking status: Former Smoker    Packs/day: 1.00    Years: 30.00    Pack years: 30.00     Types: Cigarettes    Last attempt to quit: 06/15/2010    Years since quitting: 6.5  . Smokeless tobacco: Never Used  . Tobacco comment: 2-3 cigs per week/// 06/30/12  Substance and Sexual Activity  . Alcohol use: No    Comment: socially  . Drug use: No  . Sexual activity: Not on file  Other Topics Concern  . Not on file  Social History Narrative   Widowed   5 hours of sleep   Has friends checking on her   No Pets      Patient drinks about 6 cups of caffeine daily.   Patient is right handed.     Family History:  The patient's ***family history includes Breast cancer in her daughter; Tuberculosis in her  father.   ROS:   Please see the history of present illness.    ROS All other systems reviewed and are negative.   PHYSICAL EXAM:   VS:  There were no vitals taken for this visit.   GEN: Well nourished, well developed, in no acute distress  HEENT: normal  Neck: no JVD, carotid bruits, or masses Cardiac: ***RRR; no murmurs, rubs, or gallops,no edema  Respiratory:  clear to auscultation bilaterally, normal work of breathing GI: soft, nontender, nondistended, + BS MS: no deformity or atrophy  Skin: warm and dry, no rash Neuro:  Alert and Oriented x 3, Strength and sensation are intact Psych: euthymic mood, full affect  Wt Readings from Last 3 Encounters:  06/22/16 99 lb (44.9 kg)  06/17/16 100 lb 12.8 oz (45.7 kg)  04/20/16 95 lb 6.4 oz (43.3 kg)      Studies/Labs Reviewed:   EKG:  EKG is*** ordered today.  The ekg ordered today demonstrates ***  Recent Labs: 04/20/2016: TSH 0.90 06/13/2016: ALT 26 06/15/2016: BUN 38; Creatinine, Ser 1.15; Hemoglobin 11.3; Platelets 228; Potassium 4.2; Sodium 141   Lipid Panel    Component Value Date/Time   CHOL 160 08/02/2014 0951   TRIG 57.0 08/02/2014 0951   TRIG 83 12/14/2005 1011   HDL 64.70 08/02/2014 0951   CHOLHDL 2 08/02/2014 0951   VLDL 11.4 08/02/2014 0951   LDLCALC 84 08/02/2014 0951    Additional studies/ records  that were reviewed today include:    ABI 06/14/2014 Impressions The right ABI is in the moderate to severely decreased range. The left ABI is in the moderately decreased range. Bilateral great toe pressures are abnormal.     Echo 11/16/2015 LV EF: 60% -   65%  Study Conclusions  - Left ventricle: The cavity size was normal. Wall thickness was   normal. Systolic function was normal. The estimated ejection   fraction was in the range of 60% to 65%. Wall motion was normal;   there were no regional wall motion abnormalities. Doppler   parameters are consistent with abnormal left ventricular   relaxation (grade 1 diastolic dysfunction). Doppler parameters   are consistent with high ventricular filling pressure. - Aortic valve: Trileaflet; mildly thickened leaflets. Sclerosis   without stenosis. There was mild regurgitation. - Mitral valve: There was mild regurgitation. - Left atrium: The atrium was severely dilated. - Right atrium: The atrium was mildly dilated. - Tricuspid valve: Mildly thickened leaflets. There was moderate   regurgitation. - Pulmonary arteries: PA peak pressure: 43 mm Hg (S). - Pericardium, extracardiac: A trivial pericardial effusion was   identified.    Carotid US 12/12/2015 Heterogeneous plaque, bilaterally. Stable 40-59% bilateral ICA stenosis. >50% right ECA stenosis. Normal subclavian arteries, bilaterally. Patent vertebral arteries with antegrade flow.    ASSESSMENT:    No diagnosis found.   PLAN:  In order of problems listed above:  1. ***    Medication Adjustments/Labs and Tests Ordered: Current medicines are reviewed at length with the patient today.  Concerns regarding medicines are outlined above.  Medication changes, Labs and Tests ordered today are listed in the Patient Instructions below. There are no Patient Instructions on file for this visit.   Hilbert Corrigan, [place]  01/04/2017 1:29 PM    [facility]  [facility] [place], [place], [place]  [address] Phone: [phone]; Fax: [phone]

## 2017-01-20 NOTE — Progress Notes (Signed)
Occupational Therapy Evaluation Addendum:    Apr 27, 2016 1600  OT G-codes **NOT FOR INPATIENT CLASS**  Functional Assessment Tool Used Clinical judgement  Functional Limitation Self care  Self Care Current Status [phone]) CI  Self Care Goal Status ([id number]) CI  Lucille Passy, OTR/L [phone]

## 2017-01-31 ENCOUNTER — Encounter (HOSPITAL_COMMUNITY): Payer: Self-pay | Admitting: *Deleted

## 2017-01-31 ENCOUNTER — Emergency Department (HOSPITAL_COMMUNITY): Payer: Medicare HMO

## 2017-01-31 ENCOUNTER — Other Ambulatory Visit: Payer: Self-pay

## 2017-01-31 ENCOUNTER — Inpatient Hospital Stay (HOSPITAL_COMMUNITY)
Admission: EM | Admit: 2017-01-31 | Discharge: 2017-02-05 | DRG: 190 | Disposition: A | Discharge status: Home / Self Care | Payer: Medicare HMO | Attending: Internal Medicine | Admitting: Internal Medicine

## 2017-01-31 DIAGNOSIS — M19049 Primary osteoarthritis, unspecified hand: Secondary | ICD-10-CM | POA: Diagnosis present

## 2017-01-31 DIAGNOSIS — Z9089 Acquired absence of other organs: Secondary | ICD-10-CM

## 2017-01-31 DIAGNOSIS — R69 Illness, unspecified: Secondary | ICD-10-CM | POA: Diagnosis not present

## 2017-01-31 DIAGNOSIS — Z90710 Acquired absence of both cervix and uterus: Secondary | ICD-10-CM

## 2017-01-31 DIAGNOSIS — K219 Gastro-esophageal reflux disease without esophagitis: Secondary | ICD-10-CM | POA: Diagnosis present

## 2017-01-31 DIAGNOSIS — Z87891 Personal history of nicotine dependence: Secondary | ICD-10-CM | POA: Diagnosis not present

## 2017-01-31 DIAGNOSIS — J690 Pneumonitis due to inhalation of food and vomit: Secondary | ICD-10-CM | POA: Diagnosis present

## 2017-01-31 DIAGNOSIS — Z885 Allergy status to narcotic agent status: Secondary | ICD-10-CM

## 2017-01-31 DIAGNOSIS — Z888 Allergy status to other drugs, medicaments and biological substances status: Secondary | ICD-10-CM

## 2017-01-31 DIAGNOSIS — I10 Essential (primary) hypertension: Secondary | ICD-10-CM | POA: Diagnosis not present

## 2017-01-31 DIAGNOSIS — Z87442 Personal history of urinary calculi: Secondary | ICD-10-CM

## 2017-01-31 DIAGNOSIS — Z831 Family history of other infectious and parasitic diseases: Secondary | ICD-10-CM | POA: Diagnosis not present

## 2017-01-31 DIAGNOSIS — Z66 Do not resuscitate: Secondary | ICD-10-CM | POA: Diagnosis present

## 2017-01-31 DIAGNOSIS — Z955 Presence of coronary angioplasty implant and graft: Secondary | ICD-10-CM

## 2017-01-31 DIAGNOSIS — R0602 Shortness of breath: Secondary | ICD-10-CM | POA: Diagnosis not present

## 2017-01-31 DIAGNOSIS — Z85828 Personal history of other malignant neoplasm of skin: Secondary | ICD-10-CM | POA: Diagnosis not present

## 2017-01-31 DIAGNOSIS — Z7982 Long term (current) use of aspirin: Secondary | ICD-10-CM

## 2017-01-31 DIAGNOSIS — I119 Hypertensive heart disease without heart failure: Secondary | ICD-10-CM | POA: Diagnosis not present

## 2017-01-31 DIAGNOSIS — J9601 Acute respiratory failure with hypoxia: Secondary | ICD-10-CM | POA: Diagnosis present

## 2017-01-31 DIAGNOSIS — Z9849 Cataract extraction status, unspecified eye: Secondary | ICD-10-CM

## 2017-01-31 DIAGNOSIS — E559 Vitamin D deficiency, unspecified: Secondary | ICD-10-CM | POA: Diagnosis present

## 2017-01-31 DIAGNOSIS — E785 Hyperlipidemia, unspecified: Secondary | ICD-10-CM | POA: Diagnosis not present

## 2017-01-31 DIAGNOSIS — I779 Disorder of arteries and arterioles, unspecified: Secondary | ICD-10-CM | POA: Diagnosis present

## 2017-01-31 DIAGNOSIS — R05 Cough: Secondary | ICD-10-CM | POA: Diagnosis not present

## 2017-01-31 DIAGNOSIS — Z803 Family history of malignant neoplasm of breast: Secondary | ICD-10-CM | POA: Diagnosis not present

## 2017-01-31 DIAGNOSIS — I313 Pericardial effusion (noninflammatory): Secondary | ICD-10-CM | POA: Diagnosis present

## 2017-01-31 DIAGNOSIS — I2510 Atherosclerotic heart disease of native coronary artery without angina pectoris: Secondary | ICD-10-CM

## 2017-01-31 DIAGNOSIS — J441 Chronic obstructive pulmonary disease with (acute) exacerbation: Secondary | ICD-10-CM | POA: Diagnosis not present

## 2017-01-31 DIAGNOSIS — F17210 Nicotine dependence, cigarettes, uncomplicated: Secondary | ICD-10-CM | POA: Diagnosis present

## 2017-01-31 DIAGNOSIS — Z853 Personal history of malignant neoplasm of breast: Secondary | ICD-10-CM | POA: Diagnosis not present

## 2017-01-31 DIAGNOSIS — I739 Peripheral vascular disease, unspecified: Secondary | ICD-10-CM | POA: Diagnosis present

## 2017-01-31 DIAGNOSIS — J189 Pneumonia, unspecified organism: Secondary | ICD-10-CM | POA: Diagnosis present

## 2017-01-31 LAB — I-STAT TROPONIN, ED: Troponin i, poc: 0.03 ng/mL (ref 0.00–0.08)

## 2017-01-31 LAB — RESPIRATORY PANEL BY PCR
Adenovirus: NOT DETECTED
Bordetella pertussis: NOT DETECTED
CORONAVIRUS OC43-RVPPCR: NOT DETECTED
Chlamydophila pneumoniae: NOT DETECTED
Coronavirus 229E: NOT DETECTED
Coronavirus HKU1: NOT DETECTED
Coronavirus NL63: NOT DETECTED
INFLUENZA B-RVPPCR: NOT DETECTED
Influenza A: NOT DETECTED
METAPNEUMOVIRUS-RVPPCR: NOT DETECTED
MYCOPLASMA PNEUMONIAE-RVPPCR: NOT DETECTED
PARAINFLUENZA VIRUS 1-RVPPCR: NOT DETECTED
PARAINFLUENZA VIRUS 2-RVPPCR: NOT DETECTED
Parainfluenza Virus 3: NOT DETECTED
Parainfluenza Virus 4: NOT DETECTED
RESPIRATORY SYNCYTIAL VIRUS-RVPPCR: NOT DETECTED
RHINOVIRUS / ENTEROVIRUS - RVPPCR: NOT DETECTED

## 2017-01-31 LAB — BASIC METABOLIC PANEL
Anion gap: 12 (ref 5–15)
BUN: 21 mg/dL — AB (ref 6–20)
CHLORIDE: 103 mmol/L (ref 101–111)
CO2: 25 mmol/L (ref 22–32)
CREATININE: 1.1 mg/dL — AB (ref 0.44–1.00)
Calcium: 9.3 mg/dL (ref 8.9–10.3)
GFR calc Af Amer: 51 mL/min — ABNORMAL LOW (ref >60)
GFR calc non Af Amer: 44 mL/min — ABNORMAL LOW (ref >60)
Glucose, Bld: 98 mg/dL (ref 65–99)
POTASSIUM: 3.7 mmol/L (ref 3.5–5.1)
Sodium: 140 mmol/L (ref 135–145)

## 2017-01-31 LAB — CBC
HEMATOCRIT: 40.8 % (ref 36.0–46.0)
Hemoglobin: 13.1 g/dL (ref 12.0–15.0)
MCH: 30.5 pg (ref 26.0–34.0)
MCHC: 32.1 g/dL (ref 30.0–36.0)
MCV: 94.9 fL (ref 78.0–100.0)
PLATELETS: 192 10*3/uL (ref 150–400)
RBC: 4.3 MIL/uL (ref 3.87–5.11)
RDW: 13.5 % (ref 11.5–15.5)
WBC: 13.5 10*3/uL — AB (ref 4.0–10.5)

## 2017-01-31 LAB — I-STAT CG4 LACTIC ACID, ED
Lactic Acid, Venous: 1.66 mmol/L (ref 0.5–1.9)
Lactic Acid, Venous: 1.38 mmol/L (ref 0.5–1.9)

## 2017-01-31 MED ORDER — NITROGLYCERIN 0.4 MG SL SUBL: 0.4000 mg | SUBLINGUAL_TABLET | SUBLINGUAL | Status: DC | PRN

## 2017-01-31 MED ORDER — PREDNISONE 50 MG PO TABS: 50.0000 mg | ORAL_TABLET | Freq: Two times a day (BID) | ORAL | Status: DC

## 2017-01-31 MED ORDER — ASPIRIN 81 MG PO TABS: 81.0000 mg | ORAL_TABLET | Freq: Every day | ORAL | Status: DC

## 2017-01-31 MED ORDER — LEVALBUTEROL HCL 0.63 MG/3ML IN NEBU
0.6300 mg | INHALATION_SOLUTION | Freq: Once | RESPIRATORY_TRACT | Status: AC
Start: 2017-01-31 — End: 2017-01-31
  Administered 2017-01-31: 0.63 mg via RESPIRATORY_TRACT
  Filled 2017-01-31: qty 3

## 2017-01-31 MED ORDER — METOPROLOL SUCCINATE ER 25 MG PO TB24
25.0000 mg | ORAL_TABLET | Freq: Every day | ORAL | Status: DC
Start: 2017-01-31 — End: 2017-02-05
  Administered 2017-02-01 – 2017-02-05 (×6): 25 mg via ORAL
  Filled 2017-01-31 (×6): qty 1

## 2017-01-31 MED ORDER — SODIUM CHLORIDE 0.9% FLUSH: 3.0000 mL | INTRAVENOUS | Status: DC | PRN

## 2017-01-31 MED ORDER — HYPROMELLOSE (GONIOSCOPIC) 2.5 % OP SOLN
1.0000 [drp] | Freq: Four times a day (QID) | OPHTHALMIC | Status: DC
  Filled 2017-01-31: qty 15

## 2017-01-31 MED ORDER — ASPIRIN EC 81 MG PO TBEC
81.0000 mg | DELAYED_RELEASE_TABLET | Freq: Every day | ORAL | Status: DC
  Administered 2017-02-01 – 2017-02-05 (×5): 81 mg via ORAL
  Filled 2017-01-31 (×5): qty 1

## 2017-01-31 MED ORDER — CARBOXYMETHYLCELLULOSE SODIUM 0.5 % OP SOLN: 1.0000 [drp] | Freq: Four times a day (QID) | OPHTHALMIC | Status: DC

## 2017-01-31 MED ORDER — SODIUM CHLORIDE 0.9 % IV SOLN: 250.0000 mL | INTRAVENOUS | Status: DC | PRN

## 2017-01-31 MED ORDER — SODIUM CHLORIDE 0.9 % IV BOLUS (SEPSIS)
1000.0000 mL | Freq: Once | INTRAVENOUS | Status: AC
  Administered 2017-01-31: 1000 mL via INTRAVENOUS

## 2017-01-31 MED ORDER — LEVOFLOXACIN IN D5W 500 MG/100ML IV SOLN
500.0000 mg | Freq: Once | INTRAVENOUS | Status: AC
  Administered 2017-02-01: 500 mg via INTRAVENOUS
  Filled 2017-01-31 (×2): qty 100

## 2017-01-31 MED ORDER — PREDNISONE 20 MG PO TABS
60.0000 mg | ORAL_TABLET | Freq: Once | ORAL | Status: AC
  Administered 2017-01-31: 60 mg via ORAL
  Filled 2017-01-31: qty 3

## 2017-01-31 MED ORDER — ISOSORBIDE MONONITRATE ER 30 MG PO TB24
30.0000 mg | ORAL_TABLET | Freq: Every day | ORAL | Status: DC
  Administered 2017-02-01 – 2017-02-05 (×5): 30 mg via ORAL
  Filled 2017-01-31 (×5): qty 1

## 2017-01-31 MED ORDER — VITAMIN D 1000 UNITS PO CAPS: 1000.0000 [IU] | ORAL_CAPSULE | Freq: Every day | ORAL | Status: DC

## 2017-01-31 MED ORDER — LEVALBUTEROL HCL 0.63 MG/3ML IN NEBU
0.6300 mg | INHALATION_SOLUTION | RESPIRATORY_TRACT | Status: DC | PRN
  Administered 2017-02-01: 0.63 mg via RESPIRATORY_TRACT
  Filled 2017-01-31 (×2): qty 3

## 2017-01-31 MED ORDER — VITAMIN D 1000 UNITS PO TABS
1000.0000 [IU] | ORAL_TABLET | Freq: Every day | ORAL | Status: DC
  Administered 2017-02-01 – 2017-02-05 (×5): 1000 [IU] via ORAL
  Filled 2017-01-31 (×5): qty 1

## 2017-01-31 MED ORDER — POLYVINYL ALCOHOL 1.4 % OP SOLN
1.0000 [drp] | Freq: Four times a day (QID) | OPHTHALMIC | Status: DC
  Administered 2017-01-31 – 2017-02-05 (×18): 1 [drp] via OPHTHALMIC
  Filled 2017-01-31 (×2): qty 15

## 2017-01-31 MED ORDER — SODIUM CHLORIDE 0.9% FLUSH
3.0000 mL | Freq: Two times a day (BID) | INTRAVENOUS | Status: DC
  Administered 2017-02-01 – 2017-02-05 (×10): 3 mL via INTRAVENOUS

## 2017-01-31 MED ORDER — OMEGA-3 FATTY ACIDS 1000 MG PO CAPS: 1.0000 g | ORAL_CAPSULE | Freq: Every day | ORAL | Status: DC

## 2017-01-31 MED ORDER — ISOSORBIDE MONONITRATE ER 30 MG PO TB24: 30.0000 mg | ORAL_TABLET | Freq: Every day | ORAL | Status: DC

## 2017-01-31 MED ORDER — ALBUTEROL SULFATE (2.5 MG/3ML) 0.083% IN NEBU
5.0000 mg | INHALATION_SOLUTION | Freq: Once | RESPIRATORY_TRACT | Status: AC
  Administered 2017-01-31: 5 mg via RESPIRATORY_TRACT
  Filled 2017-01-31: qty 6

## 2017-01-31 MED ORDER — AMLODIPINE BESYLATE 10 MG PO TABS: 10.0000 mg | ORAL_TABLET | Freq: Every day | ORAL | Status: DC

## 2017-01-31 MED ORDER — LEVALBUTEROL TARTRATE 45 MCG/ACT IN AERO: 1.0000 | INHALATION_SPRAY | Freq: Four times a day (QID) | RESPIRATORY_TRACT | Status: DC | PRN

## 2017-01-31 MED ORDER — AMLODIPINE BESYLATE 10 MG PO TABS
10.0000 mg | ORAL_TABLET | Freq: Every day | ORAL | Status: DC
  Administered 2017-02-01 – 2017-02-05 (×5): 10 mg via ORAL
  Filled 2017-01-31 (×5): qty 1

## 2017-01-31 MED ORDER — GUAIFENESIN ER 600 MG PO TB12
600.0000 mg | ORAL_TABLET | Freq: Two times a day (BID) | ORAL | Status: DC
  Administered 2017-02-01 – 2017-02-05 (×10): 600 mg via ORAL
  Filled 2017-01-31 (×10): qty 1

## 2017-01-31 MED ORDER — OMEGA-3-ACID ETHYL ESTERS 1 G PO CAPS
1.0000 g | ORAL_CAPSULE | Freq: Every day | ORAL | Status: DC
  Administered 2017-02-01 – 2017-02-05 (×5): 1 g via ORAL
  Filled 2017-01-31 (×5): qty 1

## 2017-01-31 MED ORDER — LEVOFLOXACIN IN D5W 250 MG/50ML IV SOLN: 250.0000 mg | INTRAVENOUS | Status: DC

## 2017-01-31 NOTE — Progress Notes (Signed)
Pharmacy Antibiotic Note  [name] [name] is a 83 y.o. female admitted on 01/31/2017 with CP, SOB, cough concerning for COPD exacerbation. Pharmacy has been consulted for Levaquin dosing.   The patient is noted to have an unspecified allergy to Gatifloxacin however has tolerated Levaquin earlier this year. SCr 1.1, CrCl~20-30 ml/min.   Plan: 1. Levaquin 500 mg x 1 followed by 250 mg IV every 24 hours 2. Will continue to follow renal function, culture results, LOT, and antibiotic de-escalation plans   Height: 5\' 5"  (165.1 cm) Weight: 100 lb (45.4 kg) IBW/kg (Calculated) : 57  Temp (24hrs), Avg:99.9 F (37.7 C), Min:99.5 F (37.5 C), Max:100.2 F (37.9 C)  Recent Labs  Lab 01/31/17 1656 01/31/17 1716 01/31/17 1830  WBC 13.5*  --   --   CREATININE 1.10*  --   --   LATICACIDVEN  --  1.66 1.38    Estimated Creatinine Clearance: 25.8 mL/min (A) (by C-G formula based on SCr of 1.1 mg/dL (H)).    Allergies  Allergen Reactions  . Medrol [Methylprednisolone] Other (See Comments)    Reported to have hallucinations by daughter after 2 doses of Medrol has been able to take prednisone this was in the setting of pneumonia  . Statins Other (See Comments)    Elevated liver enzymes  . Oxycodone Anxiety and Other (See Comments)    Also states, "It makes me feel like a zombie"  . Albuterol Other (See Comments)    Shakes   . Hydrocodone-Acetaminophen Nausea Only    Dizziness  . Tequin Other (See Comments)    Patient doesn't recall    Antimicrobials this admission: LVQ 12/31 >>  Dose adjustments this admission: n/a  Microbiology results: 12/31 RCx >> 12/31 RVP >>  Thank you for allowing pharmacy to be a part of this patient's care.  Alycia Rossetti, PharmD, BCPS Clinical Pharmacist Pager: [phone] Clinical phone for 01/31/2017 : [phone] 01/31/2017 8:35 PM

## 2017-01-31 NOTE — ED Notes (Signed)
Peak flow checked by respiratory pt unable to move measurement

## 2017-01-31 NOTE — ED Notes (Signed)
Report attempted x 1

## 2017-01-31 NOTE — H&P (Signed)
History and Physical    [name] [name]:[id number] DOB: 09-18-1927 DOA: 01/31/2017  PCP: Burnis Medin, MD  Patient coming from:  home  Chief Complaint:  Cough, sob, wheezing  HPI: [name] [name] is a 83 y.o. female with medical history significant of copd not on home oxygen, PVD, CAD comes in with several days of nasal congestion, feeling awful, coughing, wheezing not better with her home xoponex.  Fevers also but subjective.  Chills.  No le edema or swelling.  Pt allergic to albuterol and solumedrol but she will take prednisone and xoponex.  Pt referred for admission for copde.  Review of Systems: As per HPI otherwise 10 point review of systems negative.   Past Medical History:  Diagnosis Date  . CARCINOMA, BASAL CELL 02/13/2006  . CAROTID ARTERY DISEASE 10/03/2006   Carotid US 11/17: Stable 40-59% bilateral ICA stenosis >> f/u 1 year  . CHRONIC OBSTRUCTIVE PULMONARY DISEASE, ACUTE EXACERBATION 11/17/2009  . Chronic rhinitis 12/13/2006  . CORONARY ARTERY DISEASE 02/13/2006  . DEGENERATIVE JOINT DISEASE 09/27/2006  . DERMATITIS 09/25/2007  . Head trauma    closed  . HYPERLIPIDEMIA 03/10/2008  . HYPERTENSION 02/13/2006  . HYPOTHYROIDISM 01/30/2007  . LIVER FUNCTION TESTS, ABNORMAL 01/22/2009  . LOSS, HEARING NOS 08/25/2006  . Near syncope 09/18/2014  . NEOPLASM, SKIN, UNCERTAIN BEHAVIOR 44/81/8563  . NEPHROLITHIASIS, HX OF 02/27/2010  . OSTEOARTHRITIS, HAND 12/16/2008  . PERIPHERAL VASCULAR DISEASE 02/13/2006  . Personal history of malignant neoplasm of breast 07/24/2008  . Personal history of urinary calculi   . Post-traumatic wound infection 12/01/2010   Mild streaking superiorly and on one day of septra  Will give rocephin and add keflex  adn follow up   . POSTHERPETIC NEURALGIA 08/25/2006  . RENAL ARTERY STENOSIS 08/01/2008  . Shingles    recurrent  . SPINAL STENOSIS 11/01/2006  . THROMBOCYTOPENIA 07/30/2008  . UNS ADVRS EFF UNS RX MEDICINAL&BIOLOGICAL SBSTNC  01/30/2007  . UNSPECIFIED ANEMIA 03/18/2008  . Unspecified vitamin D deficiency 02/07/2007  . UNSTABLE ANGINA 12/12/2009  . Vitreous detachment     Past Surgical History:  Procedure Laterality Date  . ABDOMINAL HYSTERECTOMY    . BREAST LUMPECTOMY    . BREAST LUMPECTOMY  1998  . CATARACT EXTRACTION    . CORONARY ANGIOPLASTY WITH STENT PLACEMENT     bilat iliac stents, left renal artery and rt coronary artery last myoview 8/09 with EF 70%  . LUMBAR LAMINECTOMY    . SKIN CANCER EXCISION    . TONSILLECTOMY       reports that she quit smoking about 6 years ago. Her smoking use included cigarettes. She has a 30.00 pack-year smoking history. she has never used smokeless tobacco. She reports that she does not drink alcohol or use drugs.  Allergies  Allergen Reactions  . Medrol [Methylprednisolone] Other (See Comments)    Reported to have hallucinations by daughter after 2 doses of Medrol has been able to take prednisone this was in the setting of pneumonia  . Statins Other (See Comments)    Elevated liver enzymes  . Oxycodone Anxiety and Other (See Comments)    Also states, "It makes me feel like a zombie"  . Albuterol Other (See Comments)    Shakes   . Hydrocodone-Acetaminophen Nausea Only    Dizziness  . Tequin Other (See Comments)    Patient doesn't recall    Family History  Problem Relation Age of Onset  . Breast cancer Daughter   . Tuberculosis Father  Prior to Admission medications   Medication Sig Start Date End Date Taking? Authorizing Provider  acetaminophen (TYLENOL) 500 MG tablet Take 1 tablet (500 mg total) by mouth every 6 (six) hours as needed. Patient taking differently: Take 500 mg by mouth every 6 (six) hours as needed for moderate pain or headache.  04/05/16  Yes Rosita Fire, MD  amLODipine (NORVASC) 10 MG tablet Take 1 tablet (10 mg total) daily by mouth. 12/07/16  Yes Lelon Perla, MD  aspirin 81 MG tablet Take 81 mg by mouth daily.   Yes  [provider]  carboxymethylcellulose (REFRESH PLUS) 0.5 % SOLN Place 1 drop into both eyes 4 (four) times daily.   Yes [provider]  Cholecalciferol (VITAMIN D) 1000 UNITS capsule Take 1,000 Units by mouth daily.     Yes [provider]  fish oil-omega-3 fatty acids 1000 MG capsule Take 1 g by mouth daily.    Yes [provider]  isosorbide mononitrate (IMDUR) 30 MG 24 hr tablet Take 1 tablet (30 mg total) by mouth daily. 06/23/16  Yes Lelon Perla, MD  levalbuterol [facility] HFA) 45 MCG/ACT inhaler INHALE 2 PUFFS BY MOUTH EVERY 6 HOURS AS NEEDED FOR WHEEZING 10/15/15  Yes Rigoberto Noel, MD  levalbuterol Penne Lash) 0.63 MG/3ML nebulizer solution Take 0.63 mg by nebulization every 4 (four) hours as needed for wheezing or shortness of breath.   Yes [provider]  SPIRIVA HANDIHALER 18 MCG inhalation capsule INHALE THE CONTENTS OF 1 CAPSULE VIA INHALATION DEVICE EVERY DAY 12/24/15  Yes Rigoberto Noel, MD  traMADol (ULTRAM) 50 MG tablet Take 1 tablet (50 mg total) by mouth every 6 (six) hours as needed. Patient taking differently: Take 50 mg by mouth every 6 (six) hours as needed for moderate pain.  06/08/16  Yes Gareth Morgan, MD  doxycycline (VIBRA-TABS) 100 MG tablet Take 1 tablet (100 mg total) by mouth every 12 (twelve) hours. Patient not taking: Reported on 01/31/2017 06/18/16   Thurnell Lose, MD  methylPREDNISolone (MEDROL DOSEPAK) 4 MG TBPK tablet follow package directions Patient not taking: Reported on 01/31/2017 06/18/16   Thurnell Lose, MD  metoprolol succinate (TOPROL-XL) 25 MG 24 hr tablet TAKE 1 TABLET(25 MG) BY MOUTH DAILY 11/18/16   Lelon Perla, MD  nitroGLYCERIN (NITROSTAT) 0.4 MG SL tablet Place 1 tablet (0.4 mg total) under the tongue every 5 (five) minutes as needed for chest pain. 06/23/16 09/21/16  Lelon Perla, MD  nystatin (MYCOSTATIN) 100000 UNIT/ML suspension Take 5 mLs (500,000 Units total) by mouth 4  (four) times daily. Swish and swallow Patient not taking: Reported on 01/31/2017 06/23/16   [name], Standley Brooking, MD  predniSONE (DELTASONE) 10 MG tablet Take pills per day,,4,4,4,2,2,2,1,1,1 or as directed Patient not taking: Reported on 01/31/2017 06/22/16   Burnis Medin, MD    Physical Exam: Vitals:   01/31/17 1719 01/31/17 1730 01/31/17 1800 01/31/17 1830  BP:  (!) 149/46 (!) 114/55 (!) 101/52  Pulse:  (!) 110 (!) 116 (!) 114  Resp:  (!) 28 (!) 28 (!) 27  Temp:      TempSrc:      SpO2:  97% 90% (!) 89%  Weight: 45.4 kg (100 lb)     Height: 5\' 5"  (1.651 m)         Constitutional: NAD, calm, comfortable Vitals:   01/31/17 1719 01/31/17 1730 01/31/17 1800 01/31/17 1830  BP:  (!) 149/46 (!) 114/55 (!) 101/52  Pulse:  Marland Kitchen)  110 (!) 116 (!) 114  Resp:  (!) 28 (!) 28 (!) 27  Temp:      TempSrc:      SpO2:  97% 90% (!) 89%  Weight: 45.4 kg (100 lb)     Height: 5\' 5"  (1.651 m)      Eyes: PERRL, lids and conjunctivae normal ENMT: Mucous membranes are moist. Posterior pharynx clear of any exudate or lesions.Normal dentition.  Neck: normal, supple, no masses, no thyromegaly Respiratory: diffuse bilateral wheezing, no crackles with rhonchi. Normal respiratory effort. No accessory muscle use.  Cardiovascular: Regular rate and rhythm, no murmurs / rubs / gallops. No extremity edema. 2+ pedal pulses. No carotid bruits.  Abdomen: no tenderness, no masses palpated. No hepatosplenomegaly. Bowel sounds positive.  Musculoskeletal: no clubbing / cyanosis. No joint deformity upper and lower extremities. Good ROM, no contractures. Normal muscle tone.  Skin: no rashes, lesions, ulcers. No induration Neurologic: CN 2-12 grossly intact. Sensation intact, DTR normal. Strength 5/5 in all 4.  Psychiatric: Normal judgment and insight. Alert and oriented x 3. Normal mood.    Labs on Admission: I have personally reviewed following labs and imaging studies  CBC: Recent Labs  Lab 01/31/17 1656  WBC  13.5*  HGB 13.1  HCT 40.8  MCV 94.9  PLT 833   Basic Metabolic Panel: Recent Labs  Lab 01/31/17 1656  NA 140  K 3.7  CL 103  CO2 25  GLUCOSE 98  BUN 21*  CREATININE 1.10*  CALCIUM 9.3   GFR: Estimated Creatinine Clearance: 25.8 mL/min (A) (by C-G formula based on SCr of 1.1 mg/dL (H)). Liver Function Tests: No results for input(s): AST, ALT, ALKPHOS, BILITOT, PROT, ALBUMIN in the last 168 hours. No results for input(s): LIPASE, AMYLASE in the last 168 hours. No results for input(s): AMMONIA in the last 168 hours. Coagulation Profile: No results for input(s): INR, PROTIME in the last 168 hours. Cardiac Enzymes: No results for input(s): CKTOTAL, CKMB, CKMBINDEX, TROPONINI in the last 168 hours. BNP (last 3 results) No results for input(s): PROBNP in the last 8760 hours. HbA1C: No results for input(s): HGBA1C in the last 72 hours. CBG: No results for input(s): GLUCAP in the last 168 hours. Lipid Profile: No results for input(s): CHOL, HDL, LDLCALC, TRIG, CHOLHDL, LDLDIRECT in the last 72 hours. Thyroid Function Tests: No results for input(s): TSH, T4TOTAL, FREET4, T3FREE, THYROIDAB in the last 72 hours. Anemia Panel: No results for input(s): VITAMINB12, FOLATE, FERRITIN, TIBC, IRON, RETICCTPCT in the last 72 hours. Urine analysis:    Component Value Date/Time   COLORURINE YELLOW 06/13/2016 1505   APPEARANCEUR HAZY (A) 06/13/2016 1505   LABSPEC 1.021 06/13/2016 1505   PHURINE 5.0 06/13/2016 1505   GLUCOSEU NEGATIVE 06/13/2016 1505   HGBUR MODERATE (A) 06/13/2016 1505   HGBUR large 02/27/2010 0951   BILIRUBINUR SMALL (A) 06/13/2016 1505   BILIRUBINUR 1+ 08/12/2014 0937   KETONESUR NEGATIVE 06/13/2016 1505   PROTEINUR 30 (A) 06/13/2016 1505   UROBILINOGEN 0.2 08/12/2014 0937   UROBILINOGEN 0.2 09/21/2010 0236   NITRITE NEGATIVE 06/13/2016 1505   LEUKOCYTESUR NEGATIVE 06/13/2016 1505   Sepsis Labs:  !!!!!!!!!!!!!!!!!!!!!!!!!!!!!!!!!!!!!!!!!!!! @LABRCNTIP (procalcitonin:4,lacticidven:4) )No results found for this or any previous visit (from the past 240 hour(s)).   Radiological Exams on Admission: Dg Chest Port 1 View  Result Date: 01/31/2017 CLINICAL DATA:  Shortness of breath, cough, history coronary artery disease, COPD EXAM: PORTABLE CHEST 1 VIEW COMPARISON:  Portable exam 1652 hours compared to 06/18/2016 FINDINGS: Normal heart size, mediastinal  contours, and pulmonary vascularity. Atherosclerotic calcification aorta. Emphysematous and bronchitic changes consistent with history of COPD. No acute infiltrate, pleural effusion or pneumothorax. Diffuse osseous demineralization with levoconvex thoracolumbar scoliosis. IMPRESSION: COPD changes without acute infiltrate. Electronically Signed   By: Lavonia Dana M.D.   On: 01/31/2017 17:15    Old chart reviewed Case discussed with edp  Assessment/Plan 83 yo female with copde and likely pna  Principal Problem:   COPD exacerbation ([place])- flu pending.  freq xoponex inh treatments,  Prednisone po as she will not take solumedrol.  Levaquin.    Active Problems:   Pneumonia- obtain sputum cx, levaquin   Essential hypertension, benign- stable cont home meds   Carotid arterial disease (HCC)- stable   Peripheral vascular disease (HCC)- stable   SOB (shortness of breath)- due to above   CAD (coronary artery disease), native coronary artery- stable     DVT prophylaxis:  scds Code Status:  full Family Communication:  none Disposition Plan:  Per day team Consults called:  none Admission status:  observation   Athanasia Stanwood A MD [facility]  If 7PM-7AM, please contact night-coverage [email or web address] Password [facility]  01/31/2017, 6:54 PM

## 2017-01-31 NOTE — ED Notes (Signed)
Report given to [place], RN.

## 2017-01-31 NOTE — Progress Notes (Signed)
RT attempted peak flow per MD order but pt unable to do at this time. Pt reports she is very weak and has no strength. Pt attempted x 3 tries but unable to move at all. ED MD made aware.

## 2017-01-31 NOTE — ED Provider Notes (Signed)
[facility] EMERGENCY DEPARTMENT Provider Note   CSN: [id number] Arrival date & time: 01/31/17  1524     History   Chief Complaint Chief Complaint  Patient presents with  . Chest Pain  . Cough  . Shortness of Breath    HPI [name] [name] is a 83 y.o. female.  HPI 83 year old female COPD presents today complaining of cough and dyspnea.  She with history of COPD, coronary artery disease.  Reports having cough that began on Saturday evening.  She has used her albuterol twice today.  She continues to be dyspneic with productive cough.  She also endorses rhinorrhea and sore throat.  She reports having her flu shot this year.  She has had some subjective fever but no chills.  She has had poor appetite but has not had nausea vomiting or diarrhea. Past Medical History:  Diagnosis Date  . CARCINOMA, BASAL CELL 02/13/2006  . CAROTID ARTERY DISEASE 10/03/2006   Carotid US 11/17: Stable 40-59% bilateral ICA stenosis >> f/u 1 year  . CHRONIC OBSTRUCTIVE PULMONARY DISEASE, ACUTE EXACERBATION 11/17/2009  . Chronic rhinitis 12/13/2006  . CORONARY ARTERY DISEASE 02/13/2006  . DEGENERATIVE JOINT DISEASE 09/27/2006  . DERMATITIS 09/25/2007  . Head trauma    closed  . HYPERLIPIDEMIA 03/10/2008  . HYPERTENSION 02/13/2006  . HYPOTHYROIDISM 01/30/2007  . LIVER FUNCTION TESTS, ABNORMAL 01/22/2009  . LOSS, HEARING NOS 08/25/2006  . Near syncope 09/18/2014  . NEOPLASM, SKIN, UNCERTAIN BEHAVIOR 60/73/7106  . NEPHROLITHIASIS, HX OF 02/27/2010  . OSTEOARTHRITIS, HAND 12/16/2008  . PERIPHERAL VASCULAR DISEASE 02/13/2006  . Personal history of malignant neoplasm of breast 07/24/2008  . Personal history of urinary calculi   . Post-traumatic wound infection 12/01/2010   Mild streaking superiorly and on one day of septra  Will give rocephin and add keflex  adn follow up   . POSTHERPETIC NEURALGIA 08/25/2006  . RENAL ARTERY STENOSIS 08/01/2008  . Shingles    recurrent  . SPINAL STENOSIS  11/01/2006  . THROMBOCYTOPENIA 07/30/2008  . UNS ADVRS EFF UNS RX MEDICINAL&BIOLOGICAL SBSTNC 01/30/2007  . UNSPECIFIED ANEMIA 03/18/2008  . Unspecified vitamin D deficiency 02/07/2007  . UNSTABLE ANGINA 12/12/2009  . Vitreous detachment     Patient Active Problem List   Diagnosis Date Noted  . Lobar pneumonia ([place]) 06/14/2016  . Respiratory failure with hypoxia ([place]) 06/13/2016  . Multiple closed pelvic fractures without disruption of pelvic circle ([place]) 04/03/2016  . Multiple pelvic fractures 04/01/2016  . Fall 04/01/2016  . Pneumonia 12/19/2015  . Advanced age 32/26/2017  . Protein-calorie malnutrition ([place]) 11/27/2015  . Frailty 11/27/2015  . Protein-calorie malnutrition, severe 11/15/2015  . COPD exacerbation ([place]) 11/12/2015  . Acute respiratory failure with hypoxia ([place]) 11/12/2015  . CAD (coronary artery disease), native coronary artery 11/12/2015  . Statin intolerance 10/11/2014  . Headache disorder 09/18/2014  . Laceration of right lower leg with complication 26/94/8546  . Post-traumatic wound infection 08/27/2013  . Rash and nonspecific skin eruption 11/09/2012  . Pain of right breast 08/15/2012  . Urinary frequency 08/15/2012  . HX: breast cancer 08/15/2012  . TOBACCO USE 04/21/2012  . Arthritis 05/31/2011  . Pain of left heel 05/31/2011  . Infected cyst of skin 12/29/2010  . Leg cramps 12/12/2010  . SOB (shortness of breath) 11/26/2010  . Glossitis 11/26/2010  . Thrombocytopenia ([place]) 07/21/2010  . Traumatic ecchymosis of lower leg 07/21/2010  . Atelectasis 07/16/2010  . Tinnitus 06/04/2010  . NEPHROLITHIASIS, HX OF 02/27/2010  . HIP PAIN 02/26/2010  .  FEMORAL BRUIT, RIGHT 01/02/2010  . UNSTABLE ANGINA 12/12/2009  . Essential hypertension, benign 06/03/2009  . LIVER FUNCTION TESTS, ABNORMAL 01/22/2009  . NEOPLASM, SKIN, UNCERTAIN BEHAVIOR 02/03/7251  . OSTEOARTHRITIS, HAND 12/16/2008  . UNSPECIFIED DISORDER OF KIDNEY AND URETER 09/17/2008  . RENAL ARTERY  STENOSIS 08/01/2008  . THROMBOCYTOPENIA 07/30/2008  . PERSONAL HISTORY OF MALIGNANT NEOPLASM OF BREAST 07/24/2008  . WEIGHT LOSS 06/11/2008  . GROIN PAIN 06/11/2008  . INSOMNIA 04/05/2008  . UNSPECIFIED ANEMIA 03/18/2008  . HYPERLIPIDEMIA 03/10/2008  . UNDERWEIGHT 01/17/2008  . DERMATITIS 09/25/2007  . UNSPECIFIED VITAMIN D DEFICIENCY 02/07/2007  . HYPOTHYROIDISM 01/30/2007  . CHRONIC RHINITIS 12/13/2006  . COPD (chronic obstructive pulmonary disease) ([place]) 11/21/2006  . SPINAL STENOSIS 11/01/2006  . Carotid arterial disease ([place]) 10/03/2006  . DEGENERATIVE JOINT DISEASE 09/27/2006  . LOSS, HEARING NOS 08/25/2006  . TMJ PAIN 08/25/2006  . CARCINOMA, BASAL CELL 02/13/2006  . Coronary atherosclerosis 02/13/2006  . Peripheral vascular disease ([place]) 02/13/2006  . HEMATURIA, MICROSCOPIC, HX OF 02/13/2006    Past Surgical History:  Procedure Laterality Date  . ABDOMINAL HYSTERECTOMY    . BREAST LUMPECTOMY    . BREAST LUMPECTOMY  1998  . CATARACT EXTRACTION    . CORONARY ANGIOPLASTY WITH STENT PLACEMENT     bilat iliac stents, left renal artery and rt coronary artery last myoview 8/09 with EF 70%  . LUMBAR LAMINECTOMY    . SKIN CANCER EXCISION    . TONSILLECTOMY      OB History    No data available       Home Medications    Prior to Admission medications   Medication Sig Start Date End Date Taking? Authorizing Provider  acetaminophen (TYLENOL) 500 MG tablet Take 1 tablet (500 mg total) by mouth every 6 (six) hours as needed. Patient taking differently: Take 500 mg by mouth every 6 (six) hours as needed for moderate pain or headache.  04/05/16   Rosita Fire, MD  amLODipine (NORVASC) 10 MG tablet Take 1 tablet (10 mg total) daily by mouth. 12/07/16   Lelon Perla, MD  aspirin 81 MG tablet Take 81 mg by mouth daily.    [provider]  carboxymethylcellulose (REFRESH PLUS) 0.5 % SOLN Place 1 drop into both eyes 4 (four) times daily.    [provider]  Cholecalciferol (VITAMIN D) 1000 UNITS capsule Take 1,000 Units by mouth daily.      [provider]  doxycycline (VIBRA-TABS) 100 MG tablet Take 1 tablet (100 mg total) by mouth every 12 (twelve) hours. 06/18/16   Thurnell Lose, MD  fish oil-omega-3 fatty acids 1000 MG capsule Take 1 g by mouth daily.     [provider]  isosorbide mononitrate (IMDUR) 30 MG 24 hr tablet Take 1 tablet (30 mg total) by mouth daily. 06/23/16   Lelon Perla, MD  levalbuterol (XOPENEX HFA) 45 MCG/ACT inhaler INHALE 2 PUFFS BY MOUTH EVERY 6 HOURS AS NEEDED FOR WHEEZING 10/15/15   Rigoberto Noel, MD  levalbuterol Penne Lash) 0.63 MG/3ML nebulizer solution Take 0.63 mg by nebulization every 4 (four) hours as needed for wheezing or shortness of breath.    [provider]  methylPREDNISolone (MEDROL DOSEPAK) 4 MG TBPK tablet follow package directions 06/18/16   Thurnell Lose, MD  metoprolol succinate (TOPROL-XL) 25 MG 24 hr tablet TAKE 1 TABLET(25 MG) BY MOUTH DAILY 11/18/16   Lelon Perla, MD  nitroGLYCERIN (NITROSTAT) 0.4 MG SL tablet Place 1 tablet (0.4  mg total) under the tongue every 5 (five) minutes as needed for chest pain. 06/23/16 09/21/16  Lelon Perla, MD  nystatin (MYCOSTATIN) 100000 UNIT/ML suspension Take 5 mLs (500,000 Units total) by mouth 4 (four) times daily. Swish and swallow 06/23/16   [name], Standley Brooking, MD  predniSONE (DELTASONE) 10 MG tablet Take pills per day,,4,4,4,2,2,2,1,1,1 or as directed 06/22/16   [name], Standley Brooking, MD  SPIRIVA HANDIHALER 18 MCG inhalation capsule INHALE THE CONTENTS OF 1 CAPSULE VIA INHALATION DEVICE EVERY DAY 12/24/15   Rigoberto Noel, MD  traMADol (ULTRAM) 50 MG tablet Take 1 tablet (50 mg total) by mouth every 6 (six) hours as needed. Patient taking differently: Take 50 mg by mouth every 6 (six) hours as needed for moderate pain.  06/08/16   Gareth Morgan, MD    Family History Family History  Problem Relation Age of  Onset  . Breast cancer Daughter   . Tuberculosis Father     Social History Social History   Tobacco Use  . Smoking status: Former Smoker    Packs/day: 1.00    Years: 30.00    Pack years: 30.00    Types: Cigarettes    Last attempt to quit: 06/15/2010    Years since quitting: 6.6  . Smokeless tobacco: Never Used  . Tobacco comment: 2-3 cigs per week/// 06/30/12  Substance Use Topics  . Alcohol use: No    Comment: socially  . Drug use: No     Allergies   Medrol [methylprednisolone]; Statins; Oxycodone; Albuterol; Hydrocodone-acetaminophen; and Tequin   Review of Systems Review of Systems  All other systems reviewed and are negative.    Physical Exam Updated Vital Signs BP (!) 95/46 (BP Location: Left Arm)   Pulse 78   Temp 99.5 F (37.5 C) (Oral)   Resp (!) 22   SpO2 93%   Physical Exam  Constitutional: She appears well-developed. She appears ill.  HENT:  Head: Normocephalic and atraumatic.  Eyes: Pupils are equal, round, and reactive to light.  Neck: Normal range of motion. Neck supple.  Cardiovascular: Normal pulses. A regularly irregular rhythm present.  Pulmonary/Chest:  Some increased work of breathing Diffuse rhonchi and expiratory wheezing.  Abdominal: Soft. Bowel sounds are normal.  Musculoskeletal: Normal range of motion.  Neurological: She is alert.  Skin: Skin is warm.  Diffuse skin lesions consistent with fragile elderly skin  Psychiatric: She has a normal mood and affect.  Nursing note and vitals reviewed.    ED Treatments / Results  Labs (all labs ordered are listed, but only abnormal results are displayed) Labs Reviewed  BASIC METABOLIC PANEL  CBC  I-STAT TROPONIN, ED  I-STAT CG4 LACTIC ACID, ED    EKG  EKG Interpretation  Date/Time:  Monday January 31 2017 15:38:08 EST Ventricular Rate:  89 PR Interval:  130 QRS Duration: 84 QT Interval:  364 QTC Calculation: 442 R Axis:   -22 Text Interpretation:  Sinus rhythm with  Premature supraventricular complexes Anteroseptal infarct , age undetermined Abnormal ECG Confirmed by Pattricia Boss [phone]) on 01/31/2017 3:53:50 PM       Radiology No results found.  Procedures Procedures (including critical care time)  Medications Ordered in ED Medications  albuterol (PROVENTIL) (2.5 MG/3ML) 0.083% nebulizer solution 5 mg (not administered)  predniSONE (DELTASONE) tablet 60 mg (not administered)  sodium chloride 0.9 % bolus 1,000 mL (not administered)   Patient's record states albuterol allergy but patient states she takes albuterol home without difficulty Also states allergy to Medrol  but patient states she has taken prednisone without difficulty.  Initial Impression / Assessment and Plan / ED Course  I have reviewed the triage vital signs and the nursing notes.  Pertinent labs & imaging results that were available during my care of the patient were reviewed by me and considered in my medical decision making (see chart for details).     83 y.o. Female smoker with copd presents with cough and congestion.  Fever here with continued increased wob after prednisone and neb x 2.  Sats at 92% with increased effort and rate.  Flu pending.  Plan admission for further treatment and evaluation.  Discussed with Dr.David and will see for admission Final Clinical Impressions(s) / ED Diagnoses   Final diagnoses:  COPD exacerbation [facility])    ED Discharge Orders    None       Pattricia Boss, MD 01/31/17 [phone]

## 2017-01-31 NOTE — ED Triage Notes (Signed)
Pt reports feeling bad since Saturday with chest pain, back pain, non productive cough and sob.

## 2017-01-31 NOTE — ED Notes (Signed)
Ordered full liquid tray

## 2017-02-01 ENCOUNTER — Other Ambulatory Visit: Payer: Self-pay

## 2017-02-01 DIAGNOSIS — E559 Vitamin D deficiency, unspecified: Secondary | ICD-10-CM | POA: Diagnosis present

## 2017-02-01 DIAGNOSIS — Z85828 Personal history of other malignant neoplasm of skin: Secondary | ICD-10-CM | POA: Diagnosis not present

## 2017-02-01 DIAGNOSIS — J441 Chronic obstructive pulmonary disease with (acute) exacerbation: Secondary | ICD-10-CM | POA: Diagnosis not present

## 2017-02-01 DIAGNOSIS — Z955 Presence of coronary angioplasty implant and graft: Secondary | ICD-10-CM | POA: Diagnosis not present

## 2017-02-01 DIAGNOSIS — Z853 Personal history of malignant neoplasm of breast: Secondary | ICD-10-CM | POA: Diagnosis not present

## 2017-02-01 DIAGNOSIS — Z87442 Personal history of urinary calculi: Secondary | ICD-10-CM | POA: Diagnosis not present

## 2017-02-01 DIAGNOSIS — I313 Pericardial effusion (noninflammatory): Secondary | ICD-10-CM | POA: Diagnosis not present

## 2017-02-01 DIAGNOSIS — K219 Gastro-esophageal reflux disease without esophagitis: Secondary | ICD-10-CM | POA: Diagnosis present

## 2017-02-01 DIAGNOSIS — Z885 Allergy status to narcotic agent status: Secondary | ICD-10-CM | POA: Diagnosis not present

## 2017-02-01 DIAGNOSIS — F17210 Nicotine dependence, cigarettes, uncomplicated: Secondary | ICD-10-CM | POA: Diagnosis present

## 2017-02-01 DIAGNOSIS — Z803 Family history of malignant neoplasm of breast: Secondary | ICD-10-CM | POA: Diagnosis not present

## 2017-02-01 DIAGNOSIS — J690 Pneumonitis due to inhalation of food and vomit: Secondary | ICD-10-CM | POA: Diagnosis not present

## 2017-02-01 DIAGNOSIS — R69 Illness, unspecified: Secondary | ICD-10-CM | POA: Diagnosis not present

## 2017-02-01 DIAGNOSIS — Z9849 Cataract extraction status, unspecified eye: Secondary | ICD-10-CM | POA: Diagnosis not present

## 2017-02-01 DIAGNOSIS — E785 Hyperlipidemia, unspecified: Secondary | ICD-10-CM | POA: Diagnosis not present

## 2017-02-01 DIAGNOSIS — Z888 Allergy status to other drugs, medicaments and biological substances status: Secondary | ICD-10-CM | POA: Diagnosis not present

## 2017-02-01 DIAGNOSIS — M19049 Primary osteoarthritis, unspecified hand: Secondary | ICD-10-CM | POA: Diagnosis present

## 2017-02-01 DIAGNOSIS — I119 Hypertensive heart disease without heart failure: Secondary | ICD-10-CM | POA: Diagnosis not present

## 2017-02-01 DIAGNOSIS — Z66 Do not resuscitate: Secondary | ICD-10-CM | POA: Diagnosis not present

## 2017-02-01 DIAGNOSIS — J9601 Acute respiratory failure with hypoxia: Secondary | ICD-10-CM | POA: Diagnosis not present

## 2017-02-01 DIAGNOSIS — Z9089 Acquired absence of other organs: Secondary | ICD-10-CM | POA: Diagnosis not present

## 2017-02-01 DIAGNOSIS — Z7982 Long term (current) use of aspirin: Secondary | ICD-10-CM | POA: Diagnosis not present

## 2017-02-01 DIAGNOSIS — Z831 Family history of other infectious and parasitic diseases: Secondary | ICD-10-CM | POA: Diagnosis not present

## 2017-02-01 DIAGNOSIS — R0602 Shortness of breath: Secondary | ICD-10-CM | POA: Diagnosis present

## 2017-02-01 DIAGNOSIS — I739 Peripheral vascular disease, unspecified: Secondary | ICD-10-CM | POA: Diagnosis not present

## 2017-02-01 DIAGNOSIS — Z90710 Acquired absence of both cervix and uterus: Secondary | ICD-10-CM | POA: Diagnosis not present

## 2017-02-01 DIAGNOSIS — J189 Pneumonia, unspecified organism: Secondary | ICD-10-CM | POA: Diagnosis not present

## 2017-02-01 LAB — CBC
HEMATOCRIT: 39.9 % (ref 36.0–46.0)
HEMOGLOBIN: 12.6 g/dL (ref 12.0–15.0)
MCH: 30.1 pg (ref 26.0–34.0)
MCHC: 31.6 g/dL (ref 30.0–36.0)
MCV: 95.2 fL (ref 78.0–100.0)
Platelets: 185 10*3/uL (ref 150–400)
RBC: 4.19 MIL/uL (ref 3.87–5.11)
RDW: 13.4 % (ref 11.5–15.5)
WBC: 11 10*3/uL — ABNORMAL HIGH (ref 4.0–10.5)

## 2017-02-01 LAB — BASIC METABOLIC PANEL
ANION GAP: 9 (ref 5–15)
BUN: 19 mg/dL (ref 6–20)
CHLORIDE: 106 mmol/L (ref 101–111)
CO2: 26 mmol/L (ref 22–32)
Calcium: 8.9 mg/dL (ref 8.9–10.3)
Creatinine, Ser: 0.89 mg/dL (ref 0.44–1.00)
GFR calc Af Amer: >60 mL/min (ref >60)
GFR, EST NON AFRICAN AMERICAN: 57 mL/min — AB (ref >60)
GLUCOSE: 185 mg/dL — AB (ref 65–99)
POTASSIUM: 3.6 mmol/L (ref 3.5–5.1)
Sodium: 141 mmol/L (ref 135–145)

## 2017-02-01 LAB — EXPECTORATED SPUTUM ASSESSMENT W REFEX TO RESP CULTURE

## 2017-02-01 LAB — EXPECTORATED SPUTUM ASSESSMENT W GRAM STAIN, RFLX TO RESP C

## 2017-02-01 MED ORDER — WHITE PETROLATUM EX OINT
TOPICAL_OINTMENT | CUTANEOUS | Status: AC
Start: 2017-02-01 — End: 2017-02-01
  Administered 2017-02-01: 15:00:00
  Filled 2017-02-01: qty 28.35

## 2017-02-01 MED ORDER — CHLORHEXIDINE GLUCONATE 0.12 % MT SOLN
15.0000 mL | Freq: Two times a day (BID) | OROMUCOSAL | Status: DC
  Administered 2017-02-01 – 2017-02-05 (×9): 15 mL via OROMUCOSAL
  Filled 2017-02-01 (×10): qty 15

## 2017-02-01 MED ORDER — PREDNISONE 50 MG PO TABS
50.0000 mg | ORAL_TABLET | Freq: Every day | ORAL | Status: DC
  Administered 2017-02-01 – 2017-02-05 (×5): 50 mg via ORAL
  Filled 2017-02-01 (×5): qty 1

## 2017-02-01 MED ORDER — AZITHROMYCIN 250 MG PO TABS: 500.0000 mg | ORAL_TABLET | ORAL | Status: DC

## 2017-02-01 MED ORDER — AZITHROMYCIN 250 MG PO TABS
500.0000 mg | ORAL_TABLET | ORAL | Status: DC
  Administered 2017-02-01 – 2017-02-02 (×2): 500 mg via ORAL
  Filled 2017-02-01 (×2): qty 2

## 2017-02-01 MED ORDER — LEVALBUTEROL HCL 0.63 MG/3ML IN NEBU
0.6300 mg | INHALATION_SOLUTION | RESPIRATORY_TRACT | Status: DC | PRN
  Administered 2017-02-01 (×2): 0.63 mg via RESPIRATORY_TRACT
  Filled 2017-02-01: qty 3

## 2017-02-01 MED ORDER — CEFPODOXIME PROXETIL 200 MG PO TABS
200.0000 mg | ORAL_TABLET | Freq: Two times a day (BID) | ORAL | Status: DC
  Administered 2017-02-01 – 2017-02-03 (×5): 200 mg via ORAL
  Filled 2017-02-01 (×6): qty 1

## 2017-02-01 MED ORDER — LEVALBUTEROL HCL 0.63 MG/3ML IN NEBU
0.6300 mg | INHALATION_SOLUTION | Freq: Four times a day (QID) | RESPIRATORY_TRACT | Status: DC
  Administered 2017-02-01: 0.63 mg via RESPIRATORY_TRACT
  Filled 2017-02-01: qty 3

## 2017-02-01 NOTE — Progress Notes (Signed)
PROGRESS NOTE  [name] [name] HAL:[id number] DOB: 11-30-1927 DOA: 01/31/2017 PCP: Burnis Medin, MD  HPI/Recap of past 24 hours:  [name] [name] is a 84 y.o. year old female with medical history significant for of copd not on home oxygen, PVD, CAD who presented on 01/31/2017 with nonproductive cough over several days, subjective fevers, and worsening shortness of breath and was found to have acute hypoxic respiratory failure of multifactorial etiology.    This morning, she notes improvement in her breathing but not by much.  She denies any current chest pain.  Assessment/Plan: Principal Problem: Acute hypoxic respiratory failure, secondary to multifactorial etiology.  COPD exacerbation and CAP - Continue schedule inhaler regimen, prednisone -Azithromycin and Cefpodoxime for CAP coverage  COPD exacerbation likely secondary to CAP.  Currently requiring 2 L oxygen, not on any home oxygen.  Noted history of allergy to Solu-Medrol -Continue scheduled DuoNeb's, prednisone therapy for 5 days -RT -Flutter valve for supportive care  CAP, recent sick contacts, subjective fevers and low-grade fever here as well as a T-max of 101 reported at home clinically concerning for pneumonia though no objective evidence on chest x-ray.  RVP panel negative.  Sputum culture negative -Azithromycin and Cefpodoxime - Fluoroquinolones avoided in setting of older age and increased risk for tendon rupture   Active Problems:   Essential hypertension, benign, stable continue home Toprol   Carotid arterial disease ([place]), stable continue home aspirin and imdur   Code Status: Discussed with patient who confirmed DNR CODE STATUS on today  Family Communication: No family at bedside  Disposition Plan: Improved respiratory status with scheduled inhaler regimen and antibiotics   Consultants:  None  Procedures:  None  Antimicrobials:  Levaquin, 01/31/2017-02/01/17; Cefpodoxime 02/01/2017;  azithromycin 02/01/2017  Cultures:  None  DVT prophylaxis:  SCDs   Objective: Vitals:   02/01/17 1104 02/01/17 1439 02/01/17 1934 02/01/17 2049  BP:  (!) 110/57  (!) 131/51  Pulse:  (!) 118  (!) 54  Resp:  20  18  Temp:  98.2 F (36.8 C)  98.1 F (36.7 C)  TempSrc:  Oral  Oral  SpO2: 96% 94% 95% 95%  Weight:      Height:        Intake/Output Summary (Last 24 hours) at 02/01/2017 2346 Last data filed at 02/01/2017 2232 Gross per 24 hour  Intake 685 ml  Output 550 ml  Net 135 ml   Filed Weights   01/31/17 1719 01/31/17 2152  Weight: 45.4 kg (100 lb) 47.6 kg (104 lb 15 oz)    Exam:  General: Lying in bed, in no apparent distres Eyes: EOMI, anicteric ENT: Oral Mucosa clear and oist Neck: normal, no appreciable JVD Cardiovascular: regular rate and rhythm, no murmurs, rubs or gallops, Peripheral Pulses Present, no edema Respiratory: Receiving breathing treatment during exam, diffuse end expiratory wheezes on anterior and posterior chest fields, on 2 L oxygen Abdomen: soft, non-distended, non-tender, normal bowel sounds, no guarding or rebound tenderness Skin: No Rash Musculoskeletal:No clubbing / cyanosis. No joint deformity upper and lower extremities. Good ROM, no contractures. Normal muscle tone Neurologic: Grossly no focal neuro deficit.Mental status AAOx3, speech normal, Psychiatric:Appropriate affect, and mood  Data Reviewed: CBC: Recent Labs  Lab 01/31/17 1656 02/01/17 0407  WBC 13.5* 11.0*  HGB 13.1 12.6  HCT 40.8 39.9  MCV 94.9 95.2  PLT 192 735   Basic Metabolic Panel: Recent Labs  Lab 01/31/17 1656 02/01/17 0407  NA 140 141  K 3.7 3.6  CL  103 106  CO2 25 26  GLUCOSE 98 185*  BUN 21* 19  CREATININE 1.10* 0.89  CALCIUM 9.3 8.9   GFR: Estimated Creatinine Clearance: 33.5 mL/min (by C-G formula based on SCr of 0.89 mg/dL). Liver Function Tests: No results for input(s): AST, ALT, ALKPHOS, BILITOT, PROT, ALBUMIN in the last 168 hours. No  results for input(s): LIPASE, AMYLASE in the last 168 hours. No results for input(s): AMMONIA in the last 168 hours. Coagulation Profile: No results for input(s): INR, PROTIME in the last 168 hours. Cardiac Enzymes: No results for input(s): CKTOTAL, CKMB, CKMBINDEX, TROPONINI in the last 168 hours. BNP (last 3 results) No results for input(s): PROBNP in the last 8760 hours. HbA1C: No results for input(s): HGBA1C in the last 72 hours. CBG: No results for input(s): GLUCAP in the last 168 hours. Lipid Profile: No results for input(s): CHOL, HDL, LDLCALC, TRIG, CHOLHDL, LDLDIRECT in the last 72 hours. Thyroid Function Tests: No results for input(s): TSH, T4TOTAL, FREET4, T3FREE, THYROIDAB in the last 72 hours. Anemia Panel: No results for input(s): VITAMINB12, FOLATE, FERRITIN, TIBC, IRON, RETICCTPCT in the last 72 hours. Urine analysis:    Component Value Date/Time   COLORURINE YELLOW 06/13/2016 1505   APPEARANCEUR HAZY (A) 06/13/2016 1505   LABSPEC 1.021 06/13/2016 1505   PHURINE 5.0 06/13/2016 1505   GLUCOSEU NEGATIVE 06/13/2016 1505   HGBUR MODERATE (A) 06/13/2016 1505   HGBUR large 02/27/2010 0951   BILIRUBINUR SMALL (A) 06/13/2016 1505   BILIRUBINUR 1+ 08/12/2014 0937   KETONESUR NEGATIVE 06/13/2016 1505   PROTEINUR 30 (A) 06/13/2016 1505   UROBILINOGEN 0.2 08/12/2014 0937   UROBILINOGEN 0.2 09/21/2010 0236   NITRITE NEGATIVE 06/13/2016 1505   LEUKOCYTESUR NEGATIVE 06/13/2016 1505   Sepsis Labs: @LABRCNTIP (procalcitonin:4,lacticidven:4)  ) Recent Results (from the past 240 hour(s))  Respiratory Panel by PCR     Status: None   Collection Time: 01/31/17  4:57 PM  Result Value Ref Range Status   Adenovirus NOT DETECTED NOT DETECTED Final   Coronavirus 229E NOT DETECTED NOT DETECTED Final   Coronavirus HKU1 NOT DETECTED NOT DETECTED Final   Coronavirus NL63 NOT DETECTED NOT DETECTED Final   Coronavirus OC43 NOT DETECTED NOT DETECTED Final   Metapneumovirus NOT DETECTED  NOT DETECTED Final   Rhinovirus / Enterovirus NOT DETECTED NOT DETECTED Final   Influenza A NOT DETECTED NOT DETECTED Final   Influenza B NOT DETECTED NOT DETECTED Final   Parainfluenza Virus 1 NOT DETECTED NOT DETECTED Final   Parainfluenza Virus 2 NOT DETECTED NOT DETECTED Final   Parainfluenza Virus 3 NOT DETECTED NOT DETECTED Final   Parainfluenza Virus 4 NOT DETECTED NOT DETECTED Final   Respiratory Syncytial Virus NOT DETECTED NOT DETECTED Final   Bordetella pertussis NOT DETECTED NOT DETECTED Final   Chlamydophila pneumoniae NOT DETECTED NOT DETECTED Final   Mycoplasma pneumoniae NOT DETECTED NOT DETECTED Final  Culture, expectorated sputum-assessment     Status: None   Collection Time: 02/01/17  3:57 PM  Result Value Ref Range Status   Specimen Description EXPECTORATED SPUTUM  Final   Special Requests NONE  Final   Sputum evaluation   Final    Sputum specimen not acceptable for testing.  Please recollect.   Gram Stain Report Called to,Read Back By and Verified With: RN Julien Nordmann [phone] MLM    Report Status 02/01/2017 FINAL  Final      Studies: No results found.  Scheduled Meds: . amLODipine  10 mg Oral Daily  . aspirin  EC  81 mg Oral Daily  . azithromycin  500 mg Oral Q24H  . cefpodoxime  200 mg Oral Q12H  . chlorhexidine  15 mL Mouth Rinse BID  . cholecalciferol  1,000 Units Oral Daily  . guaiFENesin  600 mg Oral BID  . isosorbide mononitrate  30 mg Oral Daily  . levalbuterol  0.63 mg Nebulization Q6H  . metoprolol succinate  25 mg Oral Daily  . omega-3 acid ethyl esters  1 g Oral Daily  . polyvinyl alcohol  1 drop Both Eyes QID  . predniSONE  50 mg Oral Q breakfast  . sodium chloride flush  3 mL Intravenous Q12H    Continuous Infusions: . sodium chloride       LOS: 0 days     Desiree Hane, MD [facility] Pager [phone]  If 7PM-7AM, please contact night-coverage [email or web address] Password [facility] 02/01/2017, 11:46 PM

## 2017-02-01 NOTE — Progress Notes (Signed)
New pt admit from ED alert and oriented accompanied by her neighbors, with o2nc at 2 liters, COPD Exac, lung sound rhonchi, coughing, right hand peripheral IV line, will continue to monitor.

## 2017-02-02 ENCOUNTER — Inpatient Hospital Stay (HOSPITAL_COMMUNITY): Payer: Medicare HMO

## 2017-02-02 LAB — EXPECTORATED SPUTUM ASSESSMENT W REFEX TO RESP CULTURE: SPECIAL REQUESTS: NORMAL

## 2017-02-02 MED ORDER — LORAZEPAM 0.5 MG PO TABS
0.5000 mg | ORAL_TABLET | Freq: Four times a day (QID) | ORAL | Status: DC | PRN
  Administered 2017-02-02 – 2017-02-04 (×3): 0.5 mg via ORAL
  Filled 2017-02-02 (×3): qty 1

## 2017-02-02 MED ORDER — IPRATROPIUM BROMIDE 0.02 % IN SOLN
0.5000 mg | Freq: Four times a day (QID) | RESPIRATORY_TRACT | Status: DC
  Administered 2017-02-02 (×2): 0.5 mg via RESPIRATORY_TRACT
  Filled 2017-02-02 (×2): qty 2.5

## 2017-02-02 MED ORDER — IPRATROPIUM BROMIDE 0.02 % IN SOLN
0.5000 mg | Freq: Three times a day (TID) | RESPIRATORY_TRACT | Status: DC
  Administered 2017-02-03 – 2017-02-05 (×6): 0.5 mg via RESPIRATORY_TRACT
  Filled 2017-02-02 (×9): qty 2.5

## 2017-02-02 MED ORDER — LEVALBUTEROL HCL 0.63 MG/3ML IN NEBU
0.6300 mg | INHALATION_SOLUTION | Freq: Three times a day (TID) | RESPIRATORY_TRACT | Status: DC
  Administered 2017-02-02 – 2017-02-05 (×9): 0.63 mg via RESPIRATORY_TRACT
  Filled 2017-02-02 (×12): qty 3

## 2017-02-02 NOTE — Progress Notes (Signed)
PROGRESS NOTE  [name] [name] ZOX:[id number] DOB: 10/26/1927 DOA: 01/31/2017 PCP: Burnis Medin, MD  HPI/Recap of past 24 hours:  [name] [name] is a 84 y.o. year old female with medical history significant for of copd not on home oxygen, PVD, CAD who presented on 01/31/2017 with nonproductive cough over several days, subjective fevers, and worsening shortness of breath and was found to have acute hypoxic respiratory failure of multifactorial etiology.  02/02/17 - Patient seen. Gets anxious. Otherwise, no new complaints.  Assessment/Plan:  - Acute hypoxic respiratory failure. - COPD with exacerbation.  Continue Steroid and inhalers.  Add nebs atrovent  Azithromycin and Cefpodoxime  Check Procalcitonin - Possible CAP   Check procalcitonin  CT chest without contrast   Low threshold to scale down antibiotics. - Essential hypertension, benign, stable continue home Toprol - Carotid arterial disease ([place]), stable continue home aspirin and imdur   Code Status: DNR.  Family Communication:    Disposition Plan: Will depend on hospital course   Consultants:  None  Procedures:  None  Antimicrobials:  Levaquin, 01/31/2017-02/01/17; Cefpodoxime 02/01/2017; azithromycin 02/01/2017  Cultures:  None  DVT prophylaxis:  SCDs   Objective: Vitals:   02/01/17 2049 02/02/17 0506 02/02/17 1028 02/02/17 1500  BP:  (!) 141/61 133/60 (!) 118/51  Pulse:  (!) 52 (!) 59 95  Resp: 18 19  18   Temp: 98.1 F (36.7 C) 97.8 F (36.6 C)  98 F (36.7 C)  TempSrc: Oral   Oral  SpO2: 95% 99%  96%  Weight:      Height:        Intake/Output Summary (Last 24 hours) at 02/02/2017 1643 Last data filed at 02/02/2017 0900 Gross per 24 hour  Intake 897 ml  Output 600 ml  Net 297 ml   Filed Weights   01/31/17 1719 01/31/17 2152  Weight: 45.4 kg (100 lb) 47.6 kg (104 lb 15 oz)    Exam:  General: Lying in bed, in no apparent distres Eyes: EOMI, anicteric ENT: Oral Mucosa  clear and oist Neck: normal, no appreciable JVD Cardiovascular: rS1S2 Respiratory: Mild wheeze Abdomen: soft, non-distended, non-tender, normal bowel sounds, no guarding or rebound tenderness Skin: No Rash Extremities:No leg edema.   Neurologic: Grossly no focal neuro deficit.Mental status AAOx3, speech normal,   Data Reviewed: CBC: Recent Labs  Lab 01/31/17 1656 02/01/17 0407  WBC 13.5* 11.0*  HGB 13.1 12.6  HCT 40.8 39.9  MCV 94.9 95.2  PLT 192 811   Basic Metabolic Panel: Recent Labs  Lab 01/31/17 1656 02/01/17 0407  NA 140 141  K 3.7 3.6  CL 103 106  CO2 25 26  GLUCOSE 98 185*  BUN 21* 19  CREATININE 1.10* 0.89  CALCIUM 9.3 8.9   GFR: Estimated Creatinine Clearance: 33.5 mL/min (by C-G formula based on SCr of 0.89 mg/dL). Liver Function Tests: No results for input(s): AST, ALT, ALKPHOS, BILITOT, PROT, ALBUMIN in the last 168 hours. No results for input(s): LIPASE, AMYLASE in the last 168 hours. No results for input(s): AMMONIA in the last 168 hours. Coagulation Profile: No results for input(s): INR, PROTIME in the last 168 hours. Cardiac Enzymes: No results for input(s): CKTOTAL, CKMB, CKMBINDEX, TROPONINI in the last 168 hours. BNP (last 3 results) No results for input(s): PROBNP in the last 8760 hours. HbA1C: No results for input(s): HGBA1C in the last 72 hours. CBG: No results for input(s): GLUCAP in the last 168 hours. Lipid Profile: No results for input(s): CHOL, HDL, LDLCALC, TRIG,  CHOLHDL, LDLDIRECT in the last 72 hours. Thyroid Function Tests: No results for input(s): TSH, T4TOTAL, FREET4, T3FREE, THYROIDAB in the last 72 hours. Anemia Panel: No results for input(s): VITAMINB12, FOLATE, FERRITIN, TIBC, IRON, RETICCTPCT in the last 72 hours. Urine analysis:    Component Value Date/Time   COLORURINE YELLOW 06/13/2016 1505   APPEARANCEUR HAZY (A) 06/13/2016 1505   LABSPEC 1.021 06/13/2016 1505   PHURINE 5.0 06/13/2016 1505   GLUCOSEU NEGATIVE  06/13/2016 1505   HGBUR MODERATE (A) 06/13/2016 1505   HGBUR large 02/27/2010 0951   BILIRUBINUR SMALL (A) 06/13/2016 1505   BILIRUBINUR 1+ 08/12/2014 0937   KETONESUR NEGATIVE 06/13/2016 1505   PROTEINUR 30 (A) 06/13/2016 1505   UROBILINOGEN 0.2 08/12/2014 0937   UROBILINOGEN 0.2 09/21/2010 0236   NITRITE NEGATIVE 06/13/2016 1505   LEUKOCYTESUR NEGATIVE 06/13/2016 1505   Sepsis Labs: @LABRCNTIP (procalcitonin:4,lacticidven:4)  ) Recent Results (from the past 240 hour(s))  Respiratory Panel by PCR     Status: None   Collection Time: 01/31/17  4:57 PM  Result Value Ref Range Status   Adenovirus NOT DETECTED NOT DETECTED Final   Coronavirus 229E NOT DETECTED NOT DETECTED Final   Coronavirus HKU1 NOT DETECTED NOT DETECTED Final   Coronavirus NL63 NOT DETECTED NOT DETECTED Final   Coronavirus OC43 NOT DETECTED NOT DETECTED Final   Metapneumovirus NOT DETECTED NOT DETECTED Final   Rhinovirus / Enterovirus NOT DETECTED NOT DETECTED Final   Influenza A NOT DETECTED NOT DETECTED Final   Influenza B NOT DETECTED NOT DETECTED Final   Parainfluenza Virus 1 NOT DETECTED NOT DETECTED Final   Parainfluenza Virus 2 NOT DETECTED NOT DETECTED Final   Parainfluenza Virus 3 NOT DETECTED NOT DETECTED Final   Parainfluenza Virus 4 NOT DETECTED NOT DETECTED Final   Respiratory Syncytial Virus NOT DETECTED NOT DETECTED Final   Bordetella pertussis NOT DETECTED NOT DETECTED Final   Chlamydophila pneumoniae NOT DETECTED NOT DETECTED Final   Mycoplasma pneumoniae NOT DETECTED NOT DETECTED Final  Culture, expectorated sputum-assessment     Status: None   Collection Time: 02/01/17  3:57 PM  Result Value Ref Range Status   Specimen Description EXPECTORATED SPUTUM  Final   Special Requests NONE  Final   Sputum evaluation   Final    Sputum specimen not acceptable for testing.  Please recollect.   Gram Stain Report Called to,Read Back By and Verified With: RN Julien Nordmann [id number] MLM    Report Status  02/01/2017 FINAL  Final  Culture, expectorated sputum-assessment     Status: None   Collection Time: 02/02/17 10:34 AM  Result Value Ref Range Status   Specimen Description SPUTUM  Final   Special Requests Normal  Final   Sputum evaluation   Final    Sputum specimen not acceptable for testing.  Please recollect.   Results Called to: Julien Nordmann, RN AT 1620 ON 02/02/17 BY C. JESSUP, MLT.    Report Status 02/02/2017 FINAL  Final      Studies: No results found.  Scheduled Meds: . amLODipine  10 mg Oral Daily  . aspirin EC  81 mg Oral Daily  . azithromycin  500 mg Oral Q24H  . cefpodoxime  200 mg Oral Q12H  . chlorhexidine  15 mL Mouth Rinse BID  . cholecalciferol  1,000 Units Oral Daily  . guaiFENesin  600 mg Oral BID  . ipratropium  0.5 mg Nebulization Q6H  . isosorbide mononitrate  30 mg Oral Daily  . levalbuterol  0.63 mg Nebulization  TID  . metoprolol succinate  25 mg Oral Daily  . omega-3 acid ethyl esters  1 g Oral Daily  . polyvinyl alcohol  1 drop Both Eyes QID  . predniSONE  50 mg Oral Q breakfast  . sodium chloride flush  3 mL Intravenous Q12H    Continuous Infusions: . sodium chloride       LOS: 1 day     Bonnell Public, MD [facility] Pager [phone]  If 7PM-7AM, please contact night-coverage [email or web address] Password [email or web address] 02/02/2017, 4:43 PM

## 2017-02-03 DIAGNOSIS — J690 Pneumonitis due to inhalation of food and vomit: Secondary | ICD-10-CM

## 2017-02-03 DIAGNOSIS — J441 Chronic obstructive pulmonary disease with (acute) exacerbation: Principal | ICD-10-CM

## 2017-02-03 DIAGNOSIS — J9601 Acute respiratory failure with hypoxia: Secondary | ICD-10-CM

## 2017-02-03 LAB — PROCALCITONIN: Procalcitonin: 0.37 ng/mL

## 2017-02-03 LAB — CBC WITH DIFFERENTIAL/PLATELET
Basophils Absolute: 0 10*3/uL (ref 0.0–0.1)
Basophils Relative: 0 %
Eosinophils Absolute: 0 10*3/uL (ref 0.0–0.7)
Eosinophils Relative: 0 %
HCT: 40.6 % (ref 36.0–46.0)
Hemoglobin: 12.6 g/dL (ref 12.0–15.0)
Lymphocytes Relative: 10 %
Lymphs Abs: 1.2 10*3/uL (ref 0.7–4.0)
MCH: 29.9 pg (ref 26.0–34.0)
MCHC: 31 g/dL (ref 30.0–36.0)
MCV: 96.2 fL (ref 78.0–100.0)
Monocytes Absolute: 0.8 10*3/uL (ref 0.1–1.0)
Monocytes Relative: 6 %
Neutro Abs: 10.8 10*3/uL — ABNORMAL HIGH (ref 1.7–7.7)
Neutrophils Relative %: 84 %
Platelets: 244 10*3/uL (ref 150–400)
RBC: 4.22 MIL/uL (ref 3.87–5.11)
RDW: 13.5 % (ref 11.5–15.5)
WBC: 12.8 10*3/uL — ABNORMAL HIGH (ref 4.0–10.5)

## 2017-02-03 LAB — BRAIN NATRIURETIC PEPTIDE: B Natriuretic Peptide: 378.7 pg/mL — ABNORMAL HIGH (ref 0.0–100.0)

## 2017-02-03 MED ORDER — AMOXICILLIN-POT CLAVULANATE 875-125 MG PO TABS
1.0000 | ORAL_TABLET | Freq: Two times a day (BID) | ORAL | Status: DC
  Administered 2017-02-03 – 2017-02-05 (×4): 1 via ORAL
  Filled 2017-02-03 (×4): qty 1

## 2017-02-03 MED ORDER — SODIUM CHLORIDE 3 % IN NEBU
4.0000 mL | INHALATION_SOLUTION | Freq: Two times a day (BID) | RESPIRATORY_TRACT | Status: DC
  Administered 2017-02-03 – 2017-02-05 (×3): 4 mL via RESPIRATORY_TRACT
  Filled 2017-02-03 (×4): qty 4

## 2017-02-03 MED ORDER — PANTOPRAZOLE SODIUM 40 MG PO TBEC
40.0000 mg | DELAYED_RELEASE_TABLET | Freq: Every day | ORAL | Status: DC
  Administered 2017-02-03 – 2017-02-05 (×3): 40 mg via ORAL
  Filled 2017-02-03 (×3): qty 1

## 2017-02-03 NOTE — Progress Notes (Signed)
PROGRESS NOTE  ABBAGAYLE [name] BPZ:[id number] DOB: August 13, 1927 DOA: 01/31/2017 PCP: Burnis Medin, MD  HPI/Recap of past 24 hours:  [name] [name] is a 84 y.o. year old female with medical history significant for of copd not on home oxygen, PVD, CAD who presented on 01/31/2017 with nonproductive cough over several days, subjective fevers, and worsening shortness of breath and was found to have acute hypoxic respiratory failure of multifactorial etiology.  02/03/17 - Patient seen. CT Chest report noted. Will consult Pulmonary. Aspiration precautions. For swallowing evaluation to rule out aspiration.  Assessment/Plan:  - Acute hypoxic respiratory failure. - COPD with exacerbation.  Continue Steroid and inhalers.  Add nebs atrovent  Azithromycin and Cefpodoxime  Check Procalcitonin - Possible Aspiration pneumonia  See above.  Continue antibiotics  Await Pulmonary input - Essential hypertension, benign, stable continue home Toprol - Carotid arterial disease ([place]), stable continue home aspirin and imdur   Code Status: DNR.  Family Communication:    Disposition Plan: Will depend on hospital course   Consultants:  None  Procedures:  None  Antimicrobials:  Levaquin, 01/31/2017-02/01/17; Cefpodoxime 02/01/2017; azithromycin 02/01/2017  Cultures:  None  DVT prophylaxis:  SCDs   Objective: Vitals:   02/02/17 2056 02/02/17 2154 02/03/17 0500 02/03/17 0900  BP:  (!) 135/50 (!) 147/52   Pulse:  (!) 53 95 (!) 59  Resp:  17  16  Temp:  97.6 F (36.4 C) 97.8 F (36.6 C)   TempSrc:  Oral Oral   SpO2: 98% 97% 94% 94%  Weight:      Height:        Intake/Output Summary (Last 24 hours) at 02/03/2017 1319 Last data filed at 02/03/2017 0945 Gross per 24 hour  Intake 180 ml  Output 425 ml  Net -245 ml   Filed Weights   01/31/17 1719 01/31/17 2152  Weight: 45.4 kg (100 lb) 47.6 kg (104 lb 15 oz)    Exam:  General: Lying in bed, in no apparent  distres Eyes: EOMI, anicteric ENT: Oral Mucosa clear and oist Neck: normal, no appreciable JVD Cardiovascular: rS1S2 Respiratory: Mild wheeze Abdomen: soft, non-distended, non-tender, normal bowel sounds, no guarding or rebound tenderness Skin: No Rash Extremities:No leg edema.   Neurologic: Grossly no focal neuro deficit.Mental status AAOx3, speech normal,   Data Reviewed: CBC: Recent Labs  Lab 01/31/17 1656 02/01/17 0407 02/03/17 0645  WBC 13.5* 11.0* 12.8*  NEUTROABS  --   --  10.8*  HGB 13.1 12.6 12.6  HCT 40.8 39.9 40.6  MCV 94.9 95.2 96.2  PLT 192 185 242   Basic Metabolic Panel: Recent Labs  Lab 01/31/17 1656 02/01/17 0407  NA 140 141  K 3.7 3.6  CL 103 106  CO2 25 26  GLUCOSE 98 185*  BUN 21* 19  CREATININE 1.10* 0.89  CALCIUM 9.3 8.9   GFR: Estimated Creatinine Clearance: 33.5 mL/min (by C-G formula based on SCr of 0.89 mg/dL). Liver Function Tests: No results for input(s): AST, ALT, ALKPHOS, BILITOT, PROT, ALBUMIN in the last 168 hours. No results for input(s): LIPASE, AMYLASE in the last 168 hours. No results for input(s): AMMONIA in the last 168 hours. Coagulation Profile: No results for input(s): INR, PROTIME in the last 168 hours. Cardiac Enzymes: No results for input(s): CKTOTAL, CKMB, CKMBINDEX, TROPONINI in the last 168 hours. BNP (last 3 results) No results for input(s): PROBNP in the last 8760 hours. HbA1C: No results for input(s): HGBA1C in the last 72 hours. CBG: No results for  input(s): GLUCAP in the last 168 hours. Lipid Profile: No results for input(s): CHOL, HDL, LDLCALC, TRIG, CHOLHDL, LDLDIRECT in the last 72 hours. Thyroid Function Tests: No results for input(s): TSH, T4TOTAL, FREET4, T3FREE, THYROIDAB in the last 72 hours. Anemia Panel: No results for input(s): VITAMINB12, FOLATE, FERRITIN, TIBC, IRON, RETICCTPCT in the last 72 hours. Urine analysis:    Component Value Date/Time   COLORURINE YELLOW 06/13/2016 1505    APPEARANCEUR HAZY (A) 06/13/2016 1505   LABSPEC 1.021 06/13/2016 1505   PHURINE 5.0 06/13/2016 1505   GLUCOSEU NEGATIVE 06/13/2016 1505   HGBUR MODERATE (A) 06/13/2016 1505   HGBUR large 02/27/2010 0951   BILIRUBINUR SMALL (A) 06/13/2016 1505   BILIRUBINUR 1+ 08/12/2014 0937   KETONESUR NEGATIVE 06/13/2016 1505   PROTEINUR 30 (A) 06/13/2016 1505   UROBILINOGEN 0.2 08/12/2014 0937   UROBILINOGEN 0.2 09/21/2010 0236   NITRITE NEGATIVE 06/13/2016 1505   LEUKOCYTESUR NEGATIVE 06/13/2016 1505   Sepsis Labs: @LABRCNTIP (procalcitonin:4,lacticidven:4)  ) Recent Results (from the past 240 hour(s))  Respiratory Panel by PCR     Status: None   Collection Time: 01/31/17  4:57 PM  Result Value Ref Range Status   Adenovirus NOT DETECTED NOT DETECTED Final   Coronavirus 229E NOT DETECTED NOT DETECTED Final   Coronavirus HKU1 NOT DETECTED NOT DETECTED Final   Coronavirus NL63 NOT DETECTED NOT DETECTED Final   Coronavirus OC43 NOT DETECTED NOT DETECTED Final   Metapneumovirus NOT DETECTED NOT DETECTED Final   Rhinovirus / Enterovirus NOT DETECTED NOT DETECTED Final   Influenza A NOT DETECTED NOT DETECTED Final   Influenza B NOT DETECTED NOT DETECTED Final   Parainfluenza Virus 1 NOT DETECTED NOT DETECTED Final   Parainfluenza Virus 2 NOT DETECTED NOT DETECTED Final   Parainfluenza Virus 3 NOT DETECTED NOT DETECTED Final   Parainfluenza Virus 4 NOT DETECTED NOT DETECTED Final   Respiratory Syncytial Virus NOT DETECTED NOT DETECTED Final   Bordetella pertussis NOT DETECTED NOT DETECTED Final   Chlamydophila pneumoniae NOT DETECTED NOT DETECTED Final   Mycoplasma pneumoniae NOT DETECTED NOT DETECTED Final  Culture, expectorated sputum-assessment     Status: None   Collection Time: 02/01/17  3:57 PM  Result Value Ref Range Status   Specimen Description EXPECTORATED SPUTUM  Final   Special Requests NONE  Final   Sputum evaluation   Final    Sputum specimen not acceptable for testing.  Please  recollect.   Gram Stain Report Called to,Read Back By and Verified With: RN Julien Nordmann [id number] MLM    Report Status 02/01/2017 FINAL  Final  Culture, expectorated sputum-assessment     Status: None   Collection Time: 02/02/17 10:34 AM  Result Value Ref Range Status   Specimen Description SPUTUM  Final   Special Requests Normal  Final   Sputum evaluation   Final    Sputum specimen not acceptable for testing.  Please recollect.   Results Called to: Julien Nordmann, RN AT 1620 ON 02/02/17 BY C. JESSUP, MLT.    Report Status 02/02/2017 FINAL  Final      Studies: Ct Chest Wo Contrast  Result Date: 02/02/2017 CLINICAL DATA:  Follow-up pneumonia. EXAM: CT CHEST WITHOUT CONTRAST TECHNIQUE: Multidetector CT imaging of the chest was performed following the standard protocol without IV contrast. COMPARISON:  11/14/2015 FINDINGS: Cardiovascular: Mild cardiac enlargement. Small pericardial effusion. Coronary artery calcifications. Ectatic thoracic aorta with aortic calcifications. Mediastinum/Nodes: Esophagus is decompressed. No significant lymphadenopathy in the chest. Thyroid gland is atrophic. Debris and  air-fluid levels in the left mainstem bronchus and left lower lobe bronchi. Fluid or secretions in right lower lobe bronchi as well. Diffuse bronchial wall thickening. Lungs/Pleura: Consolidations in both lower lungs, greater on the left. Small bilateral pleural effusions. Peripheral fibrosis in the lungs. Emphysematous changes in the lungs. Subpleural nodules are similar to prior study and may represent enlarged lymph nodes. No pneumothorax. Upper Abdomen: No acute abnormality. Musculoskeletal: Diffuse degenerative changes throughout the thoracic spine. No destructive lesions. IMPRESSION: 1. Consolidations in both lower lungs, greater on the left with small bilateral pleural effusions. Fluid or debris in the left mainstem bronchus and bilateral lower lobe bronchi. Changes likely represent aspiration  pneumonia. 2. Mild cardiac enlargement with small pericardial effusion. 3. Coronary artery and aortic calcifications. 4. Emphysematous changes in the lungs. Aortic Atherosclerosis (ICD10-I70.0) and Emphysema (ICD10-J43.9). Electronically Signed   By: Lucienne Capers M.D.   On: 02/02/2017 22:30    Scheduled Meds: . amLODipine  10 mg Oral Daily  . aspirin EC  81 mg Oral Daily  . cefpodoxime  200 mg Oral Q12H  . chlorhexidine  15 mL Mouth Rinse BID  . cholecalciferol  1,000 Units Oral Daily  . guaiFENesin  600 mg Oral BID  . ipratropium  0.5 mg Nebulization TID  . isosorbide mononitrate  30 mg Oral Daily  . levalbuterol  0.63 mg Nebulization TID  . metoprolol succinate  25 mg Oral Daily  . omega-3 acid ethyl esters  1 g Oral Daily  . polyvinyl alcohol  1 drop Both Eyes QID  . predniSONE  50 mg Oral Q breakfast  . sodium chloride flush  3 mL Intravenous Q12H    Continuous Infusions: . sodium chloride       LOS: 2 days     Bonnell Public, MD [facility] Pager [phone]  If 7PM-7AM, please contact night-coverage [email or web address] Password [email or web address] 02/03/2017, 1:19 PM

## 2017-02-03 NOTE — Evaluation (Signed)
Clinical/Bedside Swallow Evaluation Patient Details  Name: [name] [name] MRN: [id number] Date of Birth: 06/20/1927  Today's Date: 02/03/2017 Time: SLP Start Time (ACUTE ONLY): 1330 SLP Stop Time (ACUTE ONLY): 1345 SLP Time Calculation (min) (ACUTE ONLY): 15 min  Past Medical History:  Past Medical History:  Diagnosis Date  . CARCINOMA, BASAL CELL 02/13/2006  . CAROTID ARTERY DISEASE 10/03/2006   Carotid US 11/17: Stable 40-59% bilateral ICA stenosis >> f/u 1 year  . CHRONIC OBSTRUCTIVE PULMONARY DISEASE, ACUTE EXACERBATION 11/17/2009  . Chronic rhinitis 12/13/2006  . CORONARY ARTERY DISEASE 02/13/2006  . DEGENERATIVE JOINT DISEASE 09/27/2006  . DERMATITIS 09/25/2007  . Head trauma    closed  . HYPERLIPIDEMIA 03/10/2008  . HYPERTENSION 02/13/2006  . HYPOTHYROIDISM 01/30/2007  . LIVER FUNCTION TESTS, ABNORMAL 01/22/2009  . LOSS, HEARING NOS 08/25/2006  . Near syncope 09/18/2014  . NEOPLASM, SKIN, UNCERTAIN BEHAVIOR 51/03/5850  . NEPHROLITHIASIS, HX OF 02/27/2010  . OSTEOARTHRITIS, HAND 12/16/2008  . PERIPHERAL VASCULAR DISEASE 02/13/2006  . Personal history of malignant neoplasm of breast 07/24/2008  . Personal history of urinary calculi   . Post-traumatic wound infection 12/01/2010   Mild streaking superiorly and on one day of septra  Will give rocephin and add keflex  adn follow up   . POSTHERPETIC NEURALGIA 08/25/2006  . RENAL ARTERY STENOSIS 08/01/2008  . Shingles    recurrent  . SPINAL STENOSIS 11/01/2006  . THROMBOCYTOPENIA 07/30/2008  . UNS ADVRS EFF UNS RX MEDICINAL&BIOLOGICAL SBSTNC 01/30/2007  . UNSPECIFIED ANEMIA 03/18/2008  . Unspecified vitamin D deficiency 02/07/2007  . UNSTABLE ANGINA 12/12/2009  . Vitreous detachment    Past Surgical History:  Past Surgical History:  Procedure Laterality Date  . ABDOMINAL HYSTERECTOMY    . BREAST LUMPECTOMY    . BREAST LUMPECTOMY  1998  . CATARACT EXTRACTION    . CORONARY ANGIOPLASTY WITH STENT PLACEMENT     bilat iliac stents,  left renal artery and rt coronary artery last myoview 8/09 with EF 70%  . LUMBAR LAMINECTOMY    . SKIN CANCER EXCISION    . TONSILLECTOMY     HPI:  84 year old female COPD presents today complaining of cough and dyspnea. She with history of COPD, coronary artery disease. Reports having cough that began on Saturday evening. She has used her albuterol twice today. She continues to be dyspneic with productive cough. She also endorses rhinorrhea and sore throat. She reports having her flu shot this year. She has had some subjective fever but no chills. She has had poor appetite but has not had nausea vomiting or diarrhea. Recent CT chest revealed consolidations in both lower lungs, greater on the left with small bilateral pleural effusions, fluid or debris in the left   Assessment / Plan / Recommendation Clinical Impression  Pt presents at higher risk for aspiration given history of respiratory decline, multiple hospitalizations and recent CT of chest revealing consolidation in both lower lobes. However, at bedside pt consumed 3 oz thin liquids via straw with no overt s/s of aspiration. Pt also consumed applesauce and graham crackers without any oral phase deficits or decline in respiratory status. Pt with baseline cough upon SLP entering room but PO intake didn't increase cough and cough was not directly related to intake. Would recommend returning pt to PO intake, dysphagia 3 with thin liquids (recommend dysphagia 3 for energy conservation in setting of acute fatigue from baseline cough). Education provided to nursing and ST to follow acutely to assess toleration of diet.  Pt doesn't have documented history of dysphagia, pt had barium swallow study on 11/14/15 d/t globus sensation. Exam revealed mild vestibular penetration but no evidence of aspiration and prominent cervical spine osteophytosis at levels of C5-C6 and C6-7. This didn't result in obsturction of passage of contract. No esophageal  mass or strictures were seen but there was mild esohageal dysmotility noted with breakup of primary peristalsis - no evidence of hiatal hernia or gastroesophageal reflux.    SLP Visit Diagnosis: Dysphagia, unspecified (R13.10)    Aspiration Risk  Moderate aspiration risk    Diet Recommendation Dysphagia 3 (Mech soft);Thin liquid   Liquid Administration via: Cup;Straw Medication Administration: Whole meds with liquid Supervision: Patient able to self feed;Intermittent supervision to cue for compensatory strategies Compensations: Minimize environmental distractions;Slow rate;Small sips/bites Postural Changes: Seated upright at 90 degrees    Other  Recommendations Oral Care Recommendations: Oral care BID   Follow up Recommendations        Frequency and Duration min 2x/week  2 weeks       Prognosis Prognosis for Safe Diet Advancement: Fair      Swallow Study   General Date of Onset: 01/31/17 HPI: 84 year old female COPD presents today complaining of cough and dyspnea. She with history of COPD, coronary artery disease. Reports having cough that began on Saturday evening. She has used her albuterol twice today. She continues to be dyspneic with productive cough. She also endorses rhinorrhea and sore throat. She reports having her flu shot this year. She has had some subjective fever but no chills. She has had poor appetite but has not had nausea vomiting or diarrhea. Recent CT chest revealed consolidations in both lower lungs, greater on the left with small bilateral pleural effusions, fluid or debris in the left Type of Study: Bedside Swallow Evaluation Previous Swallow Assessment: None in chart Diet Prior to this Study: NPO Temperature Spikes Noted: No Respiratory Status: Nasal cannula(2 liters) History of Recent Intubation: No Behavior/Cognition: Alert;Cooperative Oral Cavity Assessment: Within Functional Limits Oral Care Completed by SLP: Recent completion by  staff Oral Cavity - Dentition: Dentures, top;Dentures, bottom Vision: Functional for self-feeding Self-Feeding Abilities: Needs set up;Able to feed self Patient Positioning: Upright in bed Baseline Vocal Quality: Normal Volitional Cough: Strong Volitional Swallow: Able to elicit    Oral/Motor/Sensory Function Overall Oral Motor/Sensory Function: Within functional limits   Ice Chips Ice chips: Within functional limits Presentation: Spoon   Thin Liquid Thin Liquid: Within functional limits Presentation: Self Fed;Straw    Nectar Thick Nectar Thick Liquid: Not tested   Honey Thick Honey Thick Liquid: Not tested   Puree Puree: Within functional limits Presentation: Self Fed;Spoon   Solid   GO   Solid: Within functional limits Presentation: Self Fed;Spoon        Katura Eatherly 02/03/2017,2:29 PM

## 2017-02-03 NOTE — Progress Notes (Signed)
Per SLT ,pt did not have any signs of aspiration during bedside swallow test. Full liquid ordered

## 2017-02-03 NOTE — Progress Notes (Signed)
  Speech Language Pathology Treatment: Dysphagia  Patient Details Name: [name] [name] MRN: [id number] DOB: 1928/01/09 Today's Date: 02/03/2017 Time: 1345-1400 SLP Time Calculation (min) (ACUTE ONLY): 15 min  Assessment / Plan / Recommendation Clinical Impression  Skilled treatment session focused on skilled observation of pt consuming thin liquids via straw after BSE completed. As SLP was leaving room, pt's O2 dropped initially to 90% on 2 liters O2 d/t coughing that didn't appear related to recent PO intake. SLP went back into room and observed pt consume 4 additional ounces of water without any change in vitals or coughing. By the time, SLP had reentered room, pt's O2 had recovered. Continue to recommend upgrade to dysphagia 3 with thin liquids. ST to follow acutely.    HPI HPI: 84 year old female COPD presents today complaining of cough and dyspnea. She with history of COPD, coronary artery disease. Reports having cough that began on Saturday evening. She has used her albuterol twice today. She continues to be dyspneic with productive cough. She also endorses rhinorrhea and sore throat. She reports having her flu shot this year. She has had some subjective fever but no chills. She has had poor appetite but has not had nausea vomiting or diarrhea. Recent CT chest revealed consolidations in both lower lungs, greater on the left with small bilateral pleural effusions, fluid or debris in the left      SLP Plan  Continue with current plan of care       Recommendations  Diet recommendations: Dysphagia 3 (mechanical soft);Thin liquid Liquids provided via: Cup;Straw Medication Administration: Whole meds with liquid Supervision: Staff to assist with self feeding;Patient able to self feed;Intermittent supervision to cue for compensatory strategies Compensations: Minimize environmental distractions;Slow rate;Small sips/bites Postural Changes and/or Swallow Maneuvers: Seated upright  90 degrees;Upright 30-60 min after meal                Oral Care Recommendations: Oral care BID Follow up Recommendations: Home health SLP;Skilled Nursing facility SLP Visit Diagnosis: Dysphagia, unspecified (R13.10) Plan: Continue with current plan of care       [place] 02/03/2017, 2:42 PM

## 2017-02-03 NOTE — Consult Note (Signed)
Name: [name] [name] MRN: [id number] DOB: November 04, 1927    ADMISSION DATE:  01/31/2017 CONSULTATION DATE:  02/04/2016  REFERRING MD :  Dr. Marthenia Rolling  CHIEF COMPLAINT:  SOB  HISTORY OF PRESENT ILLNESS: 84 year old female with past medical history as below, which is significant for basal cell carcinoma, COPD, hypertension, hyperlipidemia, renal artery stenosis, and peripheral vascular disease.  She presented to [facility] emergency department on 12/31 with complaints of cough and "suffocation" from not be able to breathe.  Cough described as productive of yellow/green sputum.  Denies hemoptysis.  Shortness of breath is occasionally accompanied by chest pain and left arm numbness.  She reports sleeping on multiple pillows at home with associated shortness of breath if she lies flat.  She is a current everyday smoker and reports smoking about a pack a day, however, she only gets 1-2 puffs/cigarette and then mostly burn out in the history.  She was admitted by the hospitalist and treated for what was thought to be a COPD exacerbation and potentially pneumonia.  She was treated with nebulized Brocco dilators, oral corticosteroids, and IV Levaquin. Since that time she has been slow to improve CT scan of the chest was done and demonstrated bibasilar consolidation with small bilateral pleural effusions.  There is also debris noted in the left mainstem bronchus.  This is felt to represent aspiration pneumonia.  She does report occasionally getting choked up on food and drink.  On 1/3 she had a bedside swallow evaluation and demonstrated no dysphagia.  Pulmonary has been asked to consult for further evaluation.  SIGNIFICANT EVENTS  12/31 admit 1/3 PCCM consult  STUDIES:  CT chest 1/2 > Consolidations in both lower lungs, greater on the left with small bilateral pleural effusions. Fluid or debris in the left mainstem bronchus and bilateral lower lobe bronchi. Changes likely represent aspiration pneumonia.  Mild cardiac enlargement with small pericardial effusion.  PAST MEDICAL HISTORY :   has a past medical history of CARCINOMA, BASAL CELL (02/13/2006), CAROTID ARTERY DISEASE (10/03/2006), CHRONIC OBSTRUCTIVE PULMONARY DISEASE, ACUTE EXACERBATION (11/17/2009), Chronic rhinitis (12/13/2006), CORONARY ARTERY DISEASE (02/13/2006), DEGENERATIVE JOINT DISEASE (09/27/2006), DERMATITIS (09/25/2007), Head trauma, HYPERLIPIDEMIA (03/10/2008), HYPERTENSION (02/13/2006), HYPOTHYROIDISM (01/30/2007), LIVER FUNCTION TESTS, ABNORMAL (01/22/2009), LOSS, HEARING NOS (08/25/2006), Near syncope (09/18/2014), NEOPLASM, SKIN, UNCERTAIN BEHAVIOR (02/72/5366), NEPHROLITHIASIS, HX OF (02/27/2010), OSTEOARTHRITIS, HAND (12/16/2008), PERIPHERAL VASCULAR DISEASE (02/13/2006), Personal history of malignant neoplasm of breast (07/24/2008), Personal history of urinary calculi, Post-traumatic wound infection (12/01/2010), POSTHERPETIC NEURALGIA (08/25/2006), RENAL ARTERY STENOSIS (08/01/2008), Shingles, SPINAL STENOSIS (11/01/2006), THROMBOCYTOPENIA (07/30/2008), UNS ADVRS EFF UNS RX MEDICINAL&BIOLOGICAL SBSTNC (01/30/2007), UNSPECIFIED ANEMIA (03/18/2008), Unspecified vitamin D deficiency (02/07/2007), UNSTABLE ANGINA (12/12/2009), and Vitreous detachment.  has a past surgical history that includes Lumbar laminectomy; Cataract extraction; Abdominal hysterectomy; Breast lumpectomy; Tonsillectomy; Coronary angioplasty with stent; Skin cancer excision; and Breast lumpectomy (1998). Prior to Admission medications   Medication Sig Start Date End Date Taking? Authorizing Provider  acetaminophen (TYLENOL) 500 MG tablet Take 1 tablet (500 mg total) by mouth every 6 (six) hours as needed. Patient taking differently: Take 500 mg by mouth every 6 (six) hours as needed for moderate pain or headache.  04/05/16  Yes Rosita Fire, MD  amLODipine (NORVASC) 10 MG tablet Take 1 tablet (10 mg total) daily by mouth. 12/07/16  Yes Lelon Perla, MD  aspirin 81 MG  tablet Take 81 mg by mouth daily.   Yes [provider]  carboxymethylcellulose (REFRESH PLUS) 0.5 % SOLN Place 1 drop into both eyes 4 (four) times  daily.   Yes [provider]  Cholecalciferol (VITAMIN D) 1000 UNITS capsule Take 1,000 Units by mouth daily.     Yes [provider]  fish oil-omega-3 fatty acids 1000 MG capsule Take 1 g by mouth daily.    Yes [provider]  isosorbide mononitrate (IMDUR) 30 MG 24 hr tablet Take 1 tablet (30 mg total) by mouth daily. 06/23/16  Yes Lelon Perla, MD  levalbuterol [facility] HFA) 45 MCG/ACT inhaler INHALE 2 PUFFS BY MOUTH EVERY 6 HOURS AS NEEDED FOR WHEEZING 10/15/15  Yes Rigoberto Noel, MD  levalbuterol Penne Lash) 0.63 MG/3ML nebulizer solution Take 0.63 mg by nebulization every 4 (four) hours as needed for wheezing or shortness of breath.   Yes [provider]  SPIRIVA HANDIHALER 18 MCG inhalation capsule INHALE THE CONTENTS OF 1 CAPSULE VIA INHALATION DEVICE EVERY DAY 12/24/15  Yes Rigoberto Noel, MD  traMADol (ULTRAM) 50 MG tablet Take 1 tablet (50 mg total) by mouth every 6 (six) hours as needed. Patient taking differently: Take 50 mg by mouth every 6 (six) hours as needed for moderate pain.  06/08/16  Yes Gareth Morgan, MD  doxycycline (VIBRA-TABS) 100 MG tablet Take 1 tablet (100 mg total) by mouth every 12 (twelve) hours. Patient not taking: Reported on 01/31/2017 06/18/16   Thurnell Lose, MD  methylPREDNISolone (MEDROL DOSEPAK) 4 MG TBPK tablet follow package directions Patient not taking: Reported on 01/31/2017 06/18/16   Thurnell Lose, MD  metoprolol succinate (TOPROL-XL) 25 MG 24 hr tablet TAKE 1 TABLET(25 MG) BY MOUTH DAILY 11/18/16   Lelon Perla, MD  nitroGLYCERIN (NITROSTAT) 0.4 MG SL tablet Place 1 tablet (0.4 mg total) under the tongue every 5 (five) minutes as needed for chest pain. 06/23/16 09/21/16  Lelon Perla, MD  nystatin (MYCOSTATIN) 100000 UNIT/ML suspension Take 5  mLs (500,000 Units total) by mouth 4 (four) times daily. Swish and swallow Patient not taking: Reported on 01/31/2017 06/23/16   [name], Standley Brooking, MD  predniSONE (DELTASONE) 10 MG tablet Take pills per day,,4,4,4,2,2,2,1,1,1 or as directed Patient not taking: Reported on 01/31/2017 06/22/16   [name], Standley Brooking, MD   Allergies  Allergen Reactions  . Medrol [Methylprednisolone] Other (See Comments)    Reported to have hallucinations by daughter after 2 doses of Medrol has been able to take prednisone this was in the setting of pneumonia  . Statins Other (See Comments)    Elevated liver enzymes  . Oxycodone Anxiety and Other (See Comments)    Also states, "It makes me feel like a zombie"  . Albuterol Other (See Comments)    Shakes   . Hydrocodone-Acetaminophen Nausea Only    Dizziness  . Tequin Other (See Comments)    Patient doesn't recall    FAMILY HISTORY:  family history includes Breast cancer in her daughter; Tuberculosis in her father. SOCIAL HISTORY:  reports that she quit smoking about 6 years ago. Her smoking use included cigarettes. She has a 30.00 pack-year smoking history. she has never used smokeless tobacco. She reports that she does not drink alcohol or use drugs.  Review of Systems:   Bolds are positive  Constitutional: weight loss, gain, night sweats, Fevers, chills, fatigue .  HEENT: headaches, Sore throat, sneezing, nasal congestion, post nasal drip, Difficulty swallowing, Tooth/dental problems, visual complaints visual changes, ear ache CV:  chest pain, radiates:to L arm,Orthopnea, PND, swelling in lower extremities, dizziness, palpitations, syncope.  GI  heartburn, indigestion, abdominal pain, nausea, vomiting, diarrhea, change  in bowel habits, loss of appetite, bloody stools.  Resp: cough, productive:green/yellow sputum , hemoptysis, dyspnea, chest pain, pleuritic.  Skin: rash or itching or icterus GU: dysuria, change in color of urine, urgency or frequency. flank  pain, hematuria  MS: joint pain or swelling. decreased range of motion back pain Psych: change in mood or affect. depression or anxiety.  Neuro: difficulty with speech, weakness, numbness, ataxia   SUBJECTIVE:   VITAL SIGNS: Temp:  [97.6 F (36.4 C)-98.2 F (36.8 C)] 98.2 F (36.8 C) (01/03 1339) Pulse Rate:  [53-95] 75 (01/03 1339) Resp:  [16-18] 16 (01/03 1339) BP: (118-155)/(50-55) 155/55 (01/03 1339) SpO2:  [92 %-98 %] 92 % (01/03 1413) FiO2 (%):  [28 %] 28 % (01/03 1413)  PHYSICAL EXAMINATION: General:  Frail elderly female with mild WOB Neuro:  Alert, oriented, non-focal HEENT:  [place]/AT, PERRL, no JVD Cardiovascular:  RRR, no MRG Lungs:  Mildly labored. Supraclavicular retractions. Rales Abdomen:  Soft, nontender, non-distended Musculoskeletal:  No acute deformity or ROM limitation Skin:  Grossly intact  Recent Labs  Lab 01/31/17 1656 02/01/17 0407  NA 140 141  K 3.7 3.6  CL 103 106  CO2 25 26  BUN 21* 19  CREATININE 1.10* 0.89  GLUCOSE 98 185*   Recent Labs  Lab 01/31/17 1656 02/01/17 0407 02/03/17 0645  HGB 13.1 12.6 12.6  HCT 40.8 39.9 40.6  WBC 13.5* 11.0* 12.8*  PLT 192 185 244   Ct Chest Wo Contrast  Result Date: 02/02/2017 CLINICAL DATA:  Follow-up pneumonia. EXAM: CT CHEST WITHOUT CONTRAST TECHNIQUE: Multidetector CT imaging of the chest was performed following the standard protocol without IV contrast. COMPARISON:  11/14/2015 FINDINGS: Cardiovascular: Mild cardiac enlargement. Small pericardial effusion. Coronary artery calcifications. Ectatic thoracic aorta with aortic calcifications. Mediastinum/Nodes: Esophagus is decompressed. No significant lymphadenopathy in the chest. Thyroid gland is atrophic. Debris and air-fluid levels in the left mainstem bronchus and left lower lobe bronchi. Fluid or secretions in right lower lobe bronchi as well. Diffuse bronchial wall thickening. Lungs/Pleura: Consolidations in both lower lungs, greater on the left. Small  bilateral pleural effusions. Peripheral fibrosis in the lungs. Emphysematous changes in the lungs. Subpleural nodules are similar to prior study and may represent enlarged lymph nodes. No pneumothorax. Upper Abdomen: No acute abnormality. Musculoskeletal: Diffuse degenerative changes throughout the thoracic spine. No destructive lesions. IMPRESSION: 1. Consolidations in both lower lungs, greater on the left with small bilateral pleural effusions. Fluid or debris in the left mainstem bronchus and bilateral lower lobe bronchi. Changes likely represent aspiration pneumonia. 2. Mild cardiac enlargement with small pericardial effusion. 3. Coronary artery and aortic calcifications. 4. Emphysematous changes in the lungs. Aortic Atherosclerosis (ICD10-I70.0) and Emphysema (ICD10-J43.9). Electronically Signed   By: Lucienne Capers M.D.   On: 02/02/2017 22:30    ASSESSMENT / PLAN:  CAP vs aspiration PNA: She does describe food getting caught in her throat occasionally and getting choked on it. Also describes pretty significant heartburn symptoms.  - Continue antibiotics per primary - Chest PT - No role bronchoscopy at this point.  - Trend PCT as you are doing.  - Incentive spirometry - Treat GERD. No meds for this chronically. Starting PO protonix.  COPD +/- acute exacerbation. No active bronchospasm at this time.  - patient will only accept xopenex and prednisone so continue this. Favor rapid prednisone taper.  - Discharge on home COPD regimen when the time comes.   Tobacco abuse - she understands she should quit smoking, but seems unmotivated. Counseled.  Georgann Housekeeper, AGACNP-BC [facility] Pulmonology/Critical Care Pager [phone] or [phone]  02/03/2017 2:27 PM

## 2017-02-04 ENCOUNTER — Inpatient Hospital Stay (HOSPITAL_COMMUNITY): Payer: Medicare HMO

## 2017-02-04 LAB — EXPECTORATED SPUTUM ASSESSMENT W REFEX TO RESP CULTURE

## 2017-02-04 LAB — EXPECTORATED SPUTUM ASSESSMENT W GRAM STAIN, RFLX TO RESP C

## 2017-02-04 NOTE — Care Management Important Message (Signed)
Important Message  Patient Details  Name: [name] [name] MRN: [id number] Date of Birth: 07-14-1927   Medicare Important Message Given:  Yes    [name] 02/04/2017, 2:02 PM

## 2017-02-04 NOTE — Progress Notes (Signed)
  Speech Language Pathology   Patient Details Name: [name] [name] MRN: [id number] DOB: Dec 25, 1927 Today's Date: 02/04/2017 Time:  -     MBS scheduled this morning at 10:30             Houston Siren 02/04/2017, 9:39 AM   [name] [employer]vin [employer] [phone]

## 2017-02-04 NOTE — Progress Notes (Signed)
PROGRESS NOTE  [name] [name] ZHY:[id number] DOB: 11-Aug-1927 DOA: 01/31/2017 PCP: Burnis Medin, MD  Summary: [name] [name] is an 84 y.o. year old Caucasian female with past medical history significant for COPD, not on home oxygen, PVD, CAD and tobacco abuse (Continuous). Patient was admitted on 01/31/2017 with nonproductive cough over several days, subjective fevers, and worsening shortness of breath and was found to have acute hypoxic respiratory failure. Patient has been on treatment for COPD exacerbation. CT Chest revealed possible aspiration/aspiration pneumonia. Pulmonary input is appreciated. Patient actually looks better today. Patient is not keen on quitting cigarettes. Will consult PT to evaluate patient. Likely DC soon if patient continues to improve. Speech evaluation is noted - moderate aspiration risk.  No new complaints today. Comfortable. No SOB or chest pain. No fever or chills. No coughing.  Assessment/Plan:  - Acute hypoxic respiratory failure. - COPD with exacerbation.  Continue Steroid and inhalers.  Add nebs atrovent  Antibiotics changed to Augmentin  Check Procalcitonin - Possible Aspiration pneumonia  See above.  Continue antibiotics  Pulmonary input is appreciated. - Essential hypertension, benign, stable continue home Toprol - Carotid arterial disease ([place]), stable continue home aspirin and imdur   Code Status: DNR.  Family Communication:    Disposition Plan: Will depend on hospital course. Await PT evaluation. Patient lives by herself.   Consultants:  None  Procedures:  None  Antimicrobials:  Levaquin, 01/31/2017-02/01/17; Cefpodoxime 02/01/2017; azithromycin 02/01/2017  Cultures:  None  DVT prophylaxis:  SCDs   Objective: Vitals:   02/04/17 0402 02/04/17 1000 02/04/17 1443 02/04/17 1511  BP: (!) 132/56 (!) 115/56 (!) 109/45   Pulse: 61 65 62 62  Resp: 17  16 18   Temp: 98.2 F (36.8 C)  98.1 F (36.7 C)   TempSrc:  Oral  Oral   SpO2: 99% 99% 96% 96%  Weight:      Height:        Intake/Output Summary (Last 24 hours) at 02/04/2017 1948 Last data filed at 02/04/2017 1443 Gross per 24 hour  Intake 360 ml  Output 850 ml  Net -490 ml   Filed Weights   01/31/17 1719 01/31/17 2152  Weight: 45.4 kg (100 lb) 47.6 kg (104 lb 15 oz)    Exam:  General: Lying in bed, in no apparent distres Eyes: EOMI, anicteric ENT: Oral Mucosa clear and oist Neck: normal, no appreciable JVD Cardiovascular: rS1S2 Respiratory: Improved air entry. No significant wheezing noted. Abdomen: soft, non-distended, non-tender, normal bowel sounds, no guarding or rebound tenderness Skin: No Rash Extremities:No leg edema.     Neurologic: Grossly no focal neuro deficit.Mental status AAOx3, speech normal,   Data Reviewed: CBC: Recent Labs  Lab 01/31/17 1656 02/01/17 0407 02/03/17 0645  WBC 13.5* 11.0* 12.8*  NEUTROABS  --   --  10.8*  HGB 13.1 12.6 12.6  HCT 40.8 39.9 40.6  MCV 94.9 95.2 96.2  PLT 192 185 295   Basic Metabolic Panel: Recent Labs  Lab 01/31/17 1656 02/01/17 0407  NA 140 141  K 3.7 3.6  CL 103 106  CO2 25 26  GLUCOSE 98 185*  BUN 21* 19  CREATININE 1.10* 0.89  CALCIUM 9.3 8.9   GFR: Estimated Creatinine Clearance: 33.5 mL/min (by C-G formula based on SCr of 0.89 mg/dL). Liver Function Tests: No results for input(s): AST, ALT, ALKPHOS, BILITOT, PROT, ALBUMIN in the last 168 hours. No results for input(s): LIPASE, AMYLASE in the last 168 hours. No results for input(s): AMMONIA in  the last 168 hours. Coagulation Profile: No results for input(s): INR, PROTIME in the last 168 hours. Cardiac Enzymes: No results for input(s): CKTOTAL, CKMB, CKMBINDEX, TROPONINI in the last 168 hours. BNP (last 3 results) No results for input(s): PROBNP in the last 8760 hours. HbA1C: No results for input(s): HGBA1C in the last 72 hours. CBG: No results for input(s): GLUCAP in the last 168 hours. Lipid  Profile: No results for input(s): CHOL, HDL, LDLCALC, TRIG, CHOLHDL, LDLDIRECT in the last 72 hours. Thyroid Function Tests: No results for input(s): TSH, T4TOTAL, FREET4, T3FREE, THYROIDAB in the last 72 hours. Anemia Panel: No results for input(s): VITAMINB12, FOLATE, FERRITIN, TIBC, IRON, RETICCTPCT in the last 72 hours. Urine analysis:    Component Value Date/Time   COLORURINE YELLOW 06/13/2016 1505   APPEARANCEUR HAZY (A) 06/13/2016 1505   LABSPEC 1.021 06/13/2016 1505   PHURINE 5.0 06/13/2016 1505   GLUCOSEU NEGATIVE 06/13/2016 1505   HGBUR MODERATE (A) 06/13/2016 1505   HGBUR large 02/27/2010 0951   BILIRUBINUR SMALL (A) 06/13/2016 1505   BILIRUBINUR 1+ 08/12/2014 0937   KETONESUR NEGATIVE 06/13/2016 1505   PROTEINUR 30 (A) 06/13/2016 1505   UROBILINOGEN 0.2 08/12/2014 0937   UROBILINOGEN 0.2 09/21/2010 0236   NITRITE NEGATIVE 06/13/2016 1505   LEUKOCYTESUR NEGATIVE 06/13/2016 1505   Sepsis Labs: @LABRCNTIP (procalcitonin:4,lacticidven:4)  ) Recent Results (from the past 240 hour(s))  Respiratory Panel by PCR     Status: None   Collection Time: 01/31/17  4:57 PM  Result Value Ref Range Status   Adenovirus NOT DETECTED NOT DETECTED Final   Coronavirus 229E NOT DETECTED NOT DETECTED Final   Coronavirus HKU1 NOT DETECTED NOT DETECTED Final   Coronavirus NL63 NOT DETECTED NOT DETECTED Final   Coronavirus OC43 NOT DETECTED NOT DETECTED Final   Metapneumovirus NOT DETECTED NOT DETECTED Final   Rhinovirus / Enterovirus NOT DETECTED NOT DETECTED Final   Influenza A NOT DETECTED NOT DETECTED Final   Influenza B NOT DETECTED NOT DETECTED Final   Parainfluenza Virus 1 NOT DETECTED NOT DETECTED Final   Parainfluenza Virus 2 NOT DETECTED NOT DETECTED Final   Parainfluenza Virus 3 NOT DETECTED NOT DETECTED Final   Parainfluenza Virus 4 NOT DETECTED NOT DETECTED Final   Respiratory Syncytial Virus NOT DETECTED NOT DETECTED Final   Bordetella pertussis NOT DETECTED NOT DETECTED  Final   Chlamydophila pneumoniae NOT DETECTED NOT DETECTED Final   Mycoplasma pneumoniae NOT DETECTED NOT DETECTED Final  Culture, expectorated sputum-assessment     Status: None   Collection Time: 02/01/17  3:57 PM  Result Value Ref Range Status   Specimen Description EXPECTORATED SPUTUM  Final   Special Requests NONE  Final   Sputum evaluation   Final    Sputum specimen not acceptable for testing.  Please recollect.   Gram Stain Report Called to,Read Back By and Verified With: RN Julien Nordmann [id number] MLM    Report Status 02/01/2017 FINAL  Final  Culture, expectorated sputum-assessment     Status: None   Collection Time: 02/02/17 10:34 AM  Result Value Ref Range Status   Specimen Description SPUTUM  Final   Special Requests Normal  Final   Sputum evaluation   Final    Sputum specimen not acceptable for testing.  Please recollect.   Results Called to: Julien Nordmann, RN AT 1620 ON 02/02/17 BY C. JESSUP, MLT.    Report Status 02/02/2017 FINAL  Final  Culture, expectorated sputum-assessment     Status: None   Collection Time: 02/04/17  3:48 AM  Result Value Ref Range Status   Specimen Description EXPECTORATED SPUTUM  Final   Special Requests NONE  Final   Sputum evaluation THIS SPECIMEN IS ACCEPTABLE FOR SPUTUM CULTURE  Final   Report Status 02/04/2017 FINAL  Final  Culture, respiratory (NON-Expectorated)     Status: None (Preliminary result)   Collection Time: 02/04/17  3:48 AM  Result Value Ref Range Status   Specimen Description EXPECTORATED SPUTUM  Final   Special Requests NONE Reflexed from [id number]  Final   Gram Stain   Final    ABUNDANT WBC PRESENT, PREDOMINANTLY PMN NO ORGANISMS SEEN    Culture PENDING  Incomplete   Report Status PENDING  Incomplete      Studies: No results found.  Scheduled Meds: . amLODipine  10 mg Oral Daily  . amoxicillin-clavulanate  1 tablet Oral Q12H  . aspirin EC  81 mg Oral Daily  . chlorhexidine  15 mL Mouth Rinse BID  . cholecalciferol   1,000 Units Oral Daily  . guaiFENesin  600 mg Oral BID  . ipratropium  0.5 mg Nebulization TID  . isosorbide mononitrate  30 mg Oral Daily  . levalbuterol  0.63 mg Nebulization TID  . metoprolol succinate  25 mg Oral Daily  . omega-3 acid ethyl esters  1 g Oral Daily  . pantoprazole  40 mg Oral Q1200  . polyvinyl alcohol  1 drop Both Eyes QID  . predniSONE  50 mg Oral Q breakfast  . sodium chloride flush  3 mL Intravenous Q12H  . sodium chloride HYPERTONIC  4 mL Nebulization BID    Continuous Infusions: . sodium chloride       LOS: 3 days     Bonnell Public, MD [facility] Pager [phone]  If 7PM-7AM, please contact night-coverage [email or web address] Password [facility] 02/04/2017, 7:48 PM

## 2017-02-04 NOTE — Progress Notes (Signed)
Pt sleeping. Unable to perform CPT.

## 2017-02-04 NOTE — Progress Notes (Signed)
  Speech Language Pathology  Patient Details Name: [name] [name] MRN: [id number] DOB: [birth year] Today's Date: 02/04/2017 Time:  -       Time for MBS has been moved to 1:00   [name] [employer] [phone]

## 2017-02-04 NOTE — Progress Notes (Signed)
Modified Barium Swallow Progress Note  Patient Details  Name: [name] MRN: [id number] Date of Birth: [birth year]  Today's Date: 02/04/2017  Modified Barium Swallow completed.  Full report located under Chart Review in the Imaging Section.  Brief recommendations include the following:  Clinical Impression  Pt presents with a suspected esophageal dysphagia. Integrity of oral and pharyngeal phases of swallow was functional. Swallow initiation was timely and flash laryngeal penetration noted x 1. No significant residue post swallow. Esophageal scan at end of study revealed full column of barium with barium pill stopped in mid esophagus without clearance throughout study. Thin liquid or heavier trials of applesauce/barium did not assist to clear esophagus. Pt senses esophageal residue and reports discomfort. Mild coughing noted due to acute illness but could be exacerbated by residue triggering a reflux cough. Recommend continue regular texture, thin liquids, alternate liquids and solids, stay up one hour post meals and pills with water. Consider GI consult to rule out dysmotility verus stricture of esophagus.        Swallow Evaluation Recommendations       SLP Diet Recommendations: Regular solids;Thin liquid   Liquid Administration via: Cup;Straw   Medication Administration: Whole meds with liquid   Supervision: Patient able to self feed   Compensations: Slow rate;Small sips/bites;Follow solids with liquid   Postural Changes: Seated upright at 90 degrees;Remain semi-upright after after feeds/meals (Comment)   Oral Care Recommendations: Oral care BID        Houston Siren 02/04/2017,2:47 PM   [name] Colvin Caroli.[employer] [phone]

## 2017-02-04 NOTE — Progress Notes (Addendum)
Name: [name] [name] MRN: [id number] DOB: 02-Jun-1927    ADMISSION DATE:  01/31/2017 CONSULTATION DATE:  02/04/2016  REFERRING MD :  Dr. Marthenia Rolling  CHIEF COMPLAINT:  SOB  brief 84 year old female with past medical history as below, which is significant for basal cell carcinoma, COPD, hypertension, hyperlipidemia, renal artery stenosis, and peripheral vascular disease.  She presented to [facility] emergency department on 12/31 with complaints of cough and "suffocation" from not be able to breathe.  Cough described as productive of yellow/green sputum.  Denies hemoptysis.  Shortness of breath is occasionally accompanied by chest pain and left arm numbness.  She reports sleeping on multiple pillows at home with associated shortness of breath if she lies flat.  She is a current everyday smoker and reports smoking about a pack a day, however, she only gets 1-2 puffs/cigarette and then mostly burn out in the history.  She was admitted by the hospitalist and treated for what was thought to be a COPD exacerbation and potentially pneumonia.  She was treated with nebulized Brocco dilators, oral corticosteroids, and IV Levaquin. Since that time she has been slow to improve CT scan of the chest was done and demonstrated bibasilar consolidation with small bilateral pleural effusions.  There is also debris noted in the left mainstem bronchus.  This is felt to represent aspiration pneumonia.  She does report occasionally getting choked up on food and drink.  On 1/3 she had a bedside swallow evaluation and demonstrated no dysphagia.  Pulmonary has been asked to consult for further evaluation.  SIGNIFICANT EVENTS  12/31 admit CT chest 1/2 > Consolidations in both lower lungs, greater on the left with small bilateral pleural effusions. Fluid or debris in the left mainstem bronchus and bilateral lower lobe bronchi. Changes likely represent aspiration pneumonia. Mild cardiac enlargement with small pericardial  effusion. 02/04/16 - pccm conults    SUBJECTIVE:  02/04/17 - Does not say if she is better or not. But says she does have cough. Does not seem she is gettingg Chest PT and Incentive spirometry. Speech recommended D3 diet. Considered moderate aspiratin riks   VITAL SIGNS: Temp:  [97.8 F (36.6 C)-98.2 F (36.8 C)] 98.2 F (36.8 C) (01/04 0402) Pulse Rate:  [59-65] 65 (01/04 1000) Resp:  [17-18] 17 (01/04 0402) BP: (115-138)/(55-56) 115/56 (01/04 1000) SpO2:  [92 %-100 %] 99 % (01/04 1000) FiO2 (%):  [28 %] 28 % (01/03 1413)  PHYSICAL EXAMINATION: General:  Frail elderly female with mild WOB Neuro:  Alert, oriented, non-focal HEENT:  [place]/AT, PERRL, no JVD Cardiovascular:  RRR, no MRG Lungs:  Mildly labored. Supraclavicular retractions. Rales Abdomen:  Soft, nontender, non-distended Musculoskeletal:  No acute deformity or ROM limitation Skin:  Grossly intact   PULMONARY No results for input(s): PHART, PCO2ART, PO2ART, HCO3, TCO2, O2SAT in the last 168 hours.  Invalid input(s): PCO2, PO2  CBC Recent Labs  Lab 01/31/17 1656 02/01/17 0407 02/03/17 0645  HGB 13.1 12.6 12.6  HCT 40.8 39.9 40.6  WBC 13.5* 11.0* 12.8*  PLT 192 185 244    COAGULATION No results for input(s): INR in the last 168 hours.  CARDIAC  No results for input(s): TROPONINI in the last 168 hours. No results for input(s): PROBNP in the last 168 hours.   CHEMISTRY Recent Labs  Lab 01/31/17 1656 02/01/17 0407  NA 140 141  K 3.7 3.6  CL 103 106  CO2 25 26  GLUCOSE 98 185*  BUN 21* 19  CREATININE 1.10* 0.89  CALCIUM  9.3 8.9   Estimated Creatinine Clearance: 33.5 mL/min (by C-G formula based on SCr of 0.89 mg/dL).   LIVER No results for input(s): AST, ALT, ALKPHOS, BILITOT, PROT, ALBUMIN, INR in the last 168 hours.   INFECTIOUS Recent Labs  Lab 01/31/17 1716 01/31/17 1830 02/03/17 0026  LATICACIDVEN 1.66 1.38  --   PROCALCITON  --   --  0.37     ENDOCRINE CBG (last 3)  No  results for input(s): GLUCAP in the last 72 hours.       IMAGING x48h  - image(s) personally visualized  -   highlighted in bold Ct Chest Wo Contrast  Result Date: 02/02/2017 CLINICAL DATA:  Follow-up pneumonia. EXAM: CT CHEST WITHOUT CONTRAST TECHNIQUE: Multidetector CT imaging of the chest was performed following the standard protocol without IV contrast. COMPARISON:  11/14/2015 FINDINGS: Cardiovascular: Mild cardiac enlargement. Small pericardial effusion. Coronary artery calcifications. Ectatic thoracic aorta with aortic calcifications. Mediastinum/Nodes: Esophagus is decompressed. No significant lymphadenopathy in the chest. Thyroid gland is atrophic. Debris and air-fluid levels in the left mainstem bronchus and left lower lobe bronchi. Fluid or secretions in right lower lobe bronchi as well. Diffuse bronchial wall thickening. Lungs/Pleura: Consolidations in both lower lungs, greater on the left. Small bilateral pleural effusions. Peripheral fibrosis in the lungs. Emphysematous changes in the lungs. Subpleural nodules are similar to prior study and may represent enlarged lymph nodes. No pneumothorax. Upper Abdomen: No acute abnormality. Musculoskeletal: Diffuse degenerative changes throughout the thoracic spine. No destructive lesions. IMPRESSION: 1. Consolidations in both lower lungs, greater on the left with small bilateral pleural effusions. Fluid or debris in the left mainstem bronchus and bilateral lower lobe bronchi. Changes likely represent aspiration pneumonia. 2. Mild cardiac enlargement with small pericardial effusion. 3. Coronary artery and aortic calcifications. 4. Emphysematous changes in the lungs. Aortic Atherosclerosis (ICD10-I70.0) and Emphysema (ICD10-J43.9). Electronically Signed   By: Lucienne Capers M.D.   On: 02/02/2017 22:30    Recent Labs  Lab 02/03/17 0026  PROCALCITON 0.37     ASSESSMENT / PLAN:  CAP vs aspiration PNA: She does describe food getting caught in her  throat occasionally and getting choked on it. Also describes pretty significant heartburn symptoms.    Unclear if better Bronch might be warranted for clearing mucus debril in LLL. However, she is too frail and sedation could be riskly. I do not see evidence see she is getting chest PT and pulmonary tolet  PLAN - Continue antibiotics per primary - Chest PT - ordered again - flutter valve and incentive spirometry - Incentive spirometry - Treat GERD.  PO protonix. - new med for her  - swallow restrictions per speech recommendations - needs OPD CT scan chest in 6-8 weeks; PCP [name], Standley Brooking, MD can do this -> if LLL persists, then can consider bronch  COPD +/- acute exacerbation. No active bronchospasm at this time.  - patient will only accept xopenex and prednisone so continue this. Favor rapid prednisone taper.  - Discharge on home COPD regimen when the time comes.   Tobacco abuse - she understands she should quit smoking, but seems unmotivated. Counseled.    Anti-infectives (From admission, onward)   Start     Dose/Rate Route Frequency Ordered Stop   02/03/17 2200  amoxicillin-clavulanate (AUGMENTIN) 875-125 MG per tablet 1 tablet     1 tablet Oral Every 12 hours 02/03/17 1752     02/02/17 2200  azithromycin (ZITHROMAX) tablet 500 mg  Status:  Discontinued     500  mg Oral Every 24 hours 02/01/17 1119 02/01/17 1128   02/01/17 2200  Levofloxacin (LEVAQUIN) IVPB 250 mg  Status:  Discontinued     250 mg 50 mL/hr over 60 Minutes Intravenous Every 24 hours 01/31/17 2036 02/01/17 1119   02/01/17 2200  azithromycin (ZITHROMAX) tablet 500 mg  Status:  Discontinued     500 mg Oral Every 24 hours 02/01/17 1128 02/03/17 0830   02/01/17 1130  cefpodoxime (VANTIN) tablet 200 mg  Status:  Discontinued     200 mg Oral Every 12 hours 02/01/17 1119 02/03/17 1752   01/31/17 2045  levofloxacin (LEVAQUIN) IVPB 500 mg     500 mg 100 mL/hr over 60 Minutes Intravenous  Once 01/31/17 2036 02/01/17  0106     pccm will sign off  Dr. Brand Males, M.D., [facility].C.P Pulmonary and Critical Care Medicine Staff Physician, [place] Director - Interstitial Lung Disease  Program  Pulmonary [place] [facility] [place], [place], [address]  Pager: [phone], If no answer or between  15:00h - 7:00h: call [phone] Telephone: [phone]

## 2017-02-05 MED ORDER — LEVALBUTEROL HCL 0.63 MG/3ML IN NEBU: 0.6300 mg | INHALATION_SOLUTION | Freq: Three times a day (TID) | RESPIRATORY_TRACT | 12 refills | Status: DC

## 2017-02-05 MED ORDER — FLUTICASONE PROPIONATE HFA 220 MCG/ACT IN AERO: 2.0000 | INHALATION_SPRAY | Freq: Two times a day (BID) | RESPIRATORY_TRACT | 2 refills | Status: AC

## 2017-02-05 MED ORDER — PREDNISONE 10 MG PO TABS: ORAL_TABLET | ORAL | 0 refills | Status: DC

## 2017-02-05 MED ORDER — AMOXICILLIN-POT CLAVULANATE 875-125 MG PO TABS: 1.0000 | ORAL_TABLET | Freq: Two times a day (BID) | ORAL | 0 refills | Status: DC

## 2017-02-05 NOTE — Evaluation (Signed)
Physical Therapy Evaluation Patient Details Name: [name] [name] MRN: [id number] DOB: [birth year] Today's Date: 02/05/2017   History of Present Illness  Pt with pastmedical history significant for COPD,not on home oxygen, PVD, CAD and tobacco abuse (Continuous). Patient was admittedon12/31/2018with nonproductive cough over several days, subjective fevers, and worsening shortness of breath and was found to have acute hypoxic respiratory failure. Patient has been on treatment for COPD exacerbation. CT Chest revealed possible aspiration/aspiration pneumonia.  Clinical Impression  Patient present with minimal dependencies in gait and mobility, mostly due to generalized weakness.  Feel patient safe for d/c home with 24 hour supervision x 1-2 days and then intermittent.  Recommend patient use RW for next several days while she regains her strength.  Recommend f/u HHPT to continue to progress mobility, safety and balance.    Follow Up Recommendations Home health PT    Equipment Recommendations  None recommended by PT    Recommendations for Other Services       Precautions / Restrictions Precautions Precautions: Fall Precaution Comments: 1 fall within last 4 months "I tripped" Restrictions Weight Bearing Restrictions: No      Mobility  Bed Mobility Overal bed mobility: Modified Independent             General bed mobility comments: used railing to get to edge of bed  Transfers Overall transfer level: Needs assistance Equipment used: Rolling walker (2 wheeled) Transfers: Sit to/from Stand Sit to Stand: Min guard            Ambulation/Gait Ambulation/Gait assistance: Min guard Ambulation Distance (Feet): 60 Feet Assistive device: Rolling walker (2 wheeled) Gait Pattern/deviations: Step-through pattern;Antalgic     General Gait Details: pain left hip/foot from previous fall  Stairs            Wheelchair Mobility    Modified Rankin (Stroke Patients  Only)       Balance Overall balance assessment: Needs assistance Sitting-balance support: No upper extremity supported;Feet supported Sitting balance-Leahy Scale: Good     Standing balance support: No upper extremity supported Standing balance-Leahy Scale: Poor Standing balance comment: used RW for balance during gait                             Pertinent Vitals/Pain Pain Assessment: 0-10 Pain Score: 4  Pain Location: sacrum Pain Descriptors / Indicators: Discomfort Pain Intervention(s): Limited activity within patient's tolerance;Monitored during session(notified RN)    Home Living Family/patient expects to be discharged to:: Private residence Living Arrangements: Alone Available Help at Discharge: Neighbor;Family;Available PRN/intermittently Type of Home: House Home Access: Ramped entrance     Home Layout: One level Home Equipment: Walker - 2 wheels;Cane - single point      Prior Function Level of Independence: Independent with assistive device(s)         Comments: usually does not use equipment but will use RW or cane if needed or "needs to move quickly"     Hand Dominance        Extremity/Trunk Assessment   Upper Extremity Assessment Upper Extremity Assessment: Generalized weakness    Lower Extremity Assessment Lower Extremity Assessment: Generalized weakness    Cervical / Trunk Assessment Cervical / Trunk Assessment: Normal  Communication   Communication: No difficulties  Cognition Arousal/Alertness: Awake/alert Behavior During Therapy: WFL for tasks assessed/performed Overall Cognitive Status: Within Functional Limits for tasks assessed  General Comments      Exercises     Assessment/Plan    PT Assessment All further PT needs can be met in the next venue of care  PT Problem List Decreased strength;Decreased activity tolerance;Decreased balance;Decreased mobility        PT Treatment Interventions      PT Goals (Current goals can be found in the Care Plan section)  Acute Rehab PT Goals Patient Stated Goal: patient for d/c today PT Goal Formulation: All assessment and education complete, DC therapy    Frequency     Barriers to discharge        Co-evaluation               AM-PAC PT "6 Clicks" Daily Activity  Outcome Measure Difficulty turning over in bed (including adjusting bedclothes, sheets and blankets)?: A Little Difficulty moving from lying on back to sitting on the side of the bed? : A Little Difficulty sitting down on and standing up from a chair with arms (e.g., wheelchair, bedside commode, etc,.)?: A Little Help needed moving to and from a bed to chair (including a wheelchair)?: A Little Help needed walking in hospital room?: A Little Help needed climbing 3-5 steps with a railing? : A Little 6 Click Score: 18    End of Session   Activity Tolerance: Patient tolerated treatment well Patient left: in chair;with call bell/phone within reach Nurse Communication: Mobility status PT Visit Diagnosis: Unsteadiness on feet (R26.81);Muscle weakness (generalized) (M62.81)    Time: 0950-1010 PT Time Calculation (min) (ACUTE ONLY): 20 min   Charges:   PT Evaluation $PT Eval Moderate Complexity: 1 Mod     PT G Codes:        March 02, 2017 [facility], PT [phone]    Shanna Cisco 03-02-2017, 10:21 AM

## 2017-02-05 NOTE — Discharge Summary (Signed)
Physician Discharge Summary  Patient ID: [name] [name] MRN: [id number] DOB/AGE: 02/17/1927 84 y.o.  Admit date: 01/31/2017 Discharge date: 02/05/2017  Admission Diagnoses:  Discharge Diagnoses:  Principal Problem:   COPD exacerbation ([place]) Active Problems:   Essential hypertension, benign   Carotid arterial disease (HCC)   Peripheral vascular disease (HCC)   SOB (shortness of breath)   Acute respiratory failure with hypoxia (HCC)   CAD (coronary artery disease), native coronary artery   Pneumonia   Discharged Condition: stable  Hospital Course: Patient is an 84 year old Caucasian female with past medical history significant for COPD, not on home oxygen, PVD, CAD and tobacco abuse (Continuous). Patient was admitted with nonproductive cough over several days, subjective fevers, and worsening shortness of breath and was found to have acute hypoxic respiratory failure. Patient has been on treatment for COPD exacerbation. CT Chest revealed possible aspiration/aspiration pneumonia (mucus plugging remains a possibility). Patient was managed with oral steroids, antibiotics and nebulizer treatment. Chest Physical Therapy and Nebs Hypertonic saline were also utilized. Speech therapy was consulted due to concerns for possible aspiration. Aspiration precaution has been advised. Pulmonary team was consulted to assist with patient's management. Patient is back to her baseline. Physical therapy assessed patient and advised that rolling walker should be continued on discharged. Patient was counseled to quit tobacco use.   Consults: pulmonary/intensive care  Significant Diagnostic Studies: radiology: CT scan: Chest  Treatments: See above  Discharge Exam: Blood pressure (!) 161/50, pulse (!) 51, temperature 98 F (36.7 C), temperature source Oral, resp. rate (!) 22, height 5\' 4"  (1.626 m), weight 48.5 kg (107 lb), SpO2 99 %.  Disposition: 01-Home or Self Care  Discharge Instructions    Call MD for:   Complete by:  As directed    Call MD if symptoms worsen   Diet - low sodium heart healthy   Complete by:  As directed    Discharge instructions   Complete by:  As directed    Aspiration precautions. Continue current precaution as currently instituted in the hospital (Dysphagia 3, thin liquid)   Increase activity slowly   Complete by:  As directed      Allergies as of 02/05/2017      Reactions   Medrol [methylprednisolone] Other (See Comments)   Reported to have hallucinations by daughter after 2 doses of Medrol has been able to take prednisone this was in the setting of pneumonia   Statins Other (See Comments)   Elevated liver enzymes   Oxycodone Anxiety, Other (See Comments)   Also states, "It makes me feel like a zombie"   Albuterol Other (See Comments)   Shakes    Hydrocodone-acetaminophen Nausea Only   Dizziness   Tequin Other (See Comments)   Patient doesn't recall      Medication List    STOP taking these medications   acetaminophen 500 MG tablet Commonly known as:  TYLENOL   doxycycline 100 MG tablet Commonly known as:  VIBRA-TABS   fish oil-omega-3 fatty acids 1000 MG capsule   methylPREDNISolone 4 MG Tbpk tablet Commonly known as:  MEDROL DOSEPAK   nystatin 100000 UNIT/ML suspension Commonly known as:  MYCOSTATIN   traMADol 50 MG tablet Commonly known as:  ULTRAM     TAKE these medications   amLODipine 10 MG tablet Commonly known as:  NORVASC Take 1 tablet (10 mg total) daily by mouth.   amoxicillin-clavulanate 875-125 MG tablet Commonly known as:  AUGMENTIN Take 1 tablet by mouth every 12 (twelve) hours.  aspirin 81 MG tablet Take 81 mg by mouth daily.   carboxymethylcellulose 0.5 % Soln Commonly known as:  REFRESH PLUS Place 1 drop into both eyes 4 (four) times daily.   fluticasone 220 MCG/ACT inhaler Commonly known as:  FLOVENT HFA Inhale 2 puffs into the lungs 2 (two) times daily.   isosorbide mononitrate 30 MG 24 hr  tablet Commonly known as:  IMDUR Take 1 tablet (30 mg total) by mouth daily.   levalbuterol 0.63 MG/3ML nebulizer solution Commonly known as:  XOPENEX Take 3 mLs (0.63 mg total) by nebulization 3 (three) times daily. What changed:    when to take this  reasons to take this   levalbuterol 45 MCG/ACT inhaler Commonly known as:  XOPENEX HFA INHALE 2 PUFFS BY MOUTH EVERY 6 HOURS AS NEEDED FOR WHEEZING   metoprolol succinate 25 MG 24 hr tablet Commonly known as:  TOPROL-XL TAKE 1 TABLET(25 MG) BY MOUTH DAILY   nitroGLYCERIN 0.4 MG SL tablet Commonly known as:  NITROSTAT Place 1 tablet (0.4 mg total) under the tongue every 5 (five) minutes as needed for chest pain.   predniSONE 10 MG tablet Commonly known as:  DELTASONE Prednisone 40mg  po once daily for 3 days, then 30mg  po once daily for 3 days, then 20mg  po once daily for 3 days and then 10mg  po once daily for 3 days and stop. What changed:  additional instructions   SPIRIVA HANDIHALER 18 MCG inhalation capsule Generic drug:  tiotropium INHALE THE CONTENTS OF 1 CAPSULE VIA INHALATION DEVICE EVERY DAY   Vitamin D 1000 units capsule Take 1,000 Units by mouth daily.            Durable Medical Equipment  (From admission, onward)        Start     Ordered   02/05/17 1121  For home use only DME Gilford Rile  [facility])  Once    Question:  Patient needs a walker to treat with the following condition  Answer:  Balance problems   02/05/17 1123       Signed: Bonnell Public 02/05/2017, 11:24 AM

## 2017-02-05 NOTE — Progress Notes (Signed)
Patient sleeping. CPT to resume at next scheduled time.

## 2017-02-05 NOTE — Progress Notes (Signed)
Rolling walker ordered and to be delivered to the room today prior to discharging home today; B Quintavious Rinck RN,MHA,BSN [phone]

## 2017-02-06 LAB — CULTURE, RESPIRATORY W GRAM STAIN: Culture: NORMAL

## 2017-02-06 LAB — CULTURE, RESPIRATORY

## 2017-02-07 ENCOUNTER — Telehealth: Payer: Self-pay | Admitting: *Deleted

## 2017-02-07 NOTE — Telephone Encounter (Signed)
Transition Care Management Follow-up Telephone Call  Per Discharge Summary:  Admit date: 01/31/2017 Discharge date: 02/05/2017  Admission Diagnoses:  Discharge Diagnoses:  Principal Problem:   COPD exacerbation ([place]) Active Problems:   Essential hypertension, benign   Carotid arterial disease (HCC)   Peripheral vascular disease (HCC)   SOB (shortness of breath)   Acute respiratory failure with hypoxia (HCC)   CAD (coronary artery disease), native coronary artery   Pneumonia   Discharged Condition: stable  --   How have you been since you were released from the hospital? "Terrible. Tired out. Weak." Patient reports she is very tired from being in the hospital. Also states she is aggravated right now, but does not care to elaborate. Emotional support offered. She does not report any acute or worsening symptoms.   Do you understand why you were in the hospital? yes   Do you understand the discharge instructions? no   Where were you discharged to? Home   Items Reviewed:  Medications reviewed: yes  Allergies reviewed: yes  Dietary changes reviewed: yes  Referrals reviewed: yes   Functional Questionnaire:   Activities of Daily Living (ADLs):   She states they are independent in the following: ambulation, bathing and hygiene, feeding, continence, grooming, toileting and dressing States they require assistance with the following: none, using walker at home   Any transportation issues/concerns?: yes, patient normally drives herself to appointments but is not driving right now. She thinks her neighbor, who is also assisting w/ medications, will be able to drive her.   Any patient concerns? no   Confirmed importance and date/time of follow-up visits scheduled yes  Provider Appointment booked with Dr. Shanon Ace 02/14/17 @ 2:00pm  Confirmed with patient if condition begins to worsen call PCP or go to the ER.  Patient was given the office number and encouraged  to call back with question or concerns.  : yes

## 2017-02-07 NOTE — Telephone Encounter (Signed)
Please make sure she has a Fu visit with the  Pulmonary   Office     Also as she was hosp for  [facility]

## 2017-02-08 NOTE — Telephone Encounter (Signed)
Please reach out to patient to schedule hospital follow-up w/ pulmonology. She already has appt w/ PCP.

## 2017-02-08 NOTE — Telephone Encounter (Signed)
Appt made and patient made aware. Nothing further needed.

## 2017-02-08 NOTE — Telephone Encounter (Signed)
Pl make appt with NP

## 2017-02-14 ENCOUNTER — Inpatient Hospital Stay: Payer: Medicare HMO | Admitting: Internal Medicine

## 2017-02-15 ENCOUNTER — Ambulatory Visit (INDEPENDENT_AMBULATORY_CARE_PROVIDER_SITE_OTHER): Payer: Medicare HMO | Admitting: Adult Health

## 2017-02-15 ENCOUNTER — Encounter: Payer: Self-pay | Admitting: Adult Health

## 2017-02-15 VITALS — BP 108/58 | HR 66 | Ht 64.0 in | Wt 104.6 lb

## 2017-02-15 DIAGNOSIS — J441 Chronic obstructive pulmonary disease with (acute) exacerbation: Secondary | ICD-10-CM | POA: Diagnosis not present

## 2017-02-15 DIAGNOSIS — J189 Pneumonia, unspecified organism: Secondary | ICD-10-CM

## 2017-02-15 DIAGNOSIS — J449 Chronic obstructive pulmonary disease, unspecified: Secondary | ICD-10-CM | POA: Diagnosis not present

## 2017-02-15 NOTE — Patient Instructions (Signed)
Make sure you are taking your inhalers on a regular basis Continue on Flovent twice daily, rinse after use Continue on Spiriva daily Continue not to smoke Chest x-ray today Follow-up with Dr. Elsworth Soho in 4-6 weeks and as needed Please contact office for sooner follow up if symptoms do not improve or worsen or seek emergency care

## 2017-02-15 NOTE — Assessment & Plan Note (Signed)
Bilateral pneumonia clinically improving with antibiotics.  Would like to do chest x-ray today however patient declined will re-check x-ray on return in 4 weeks.

## 2017-02-15 NOTE — Assessment & Plan Note (Signed)
Recent exacerbation with associated pneumonia.  Patient is clinically improving.  Patient is encouraged on medication compliance and smoking cessation.  Plan  Patient Instructions  Make sure you are taking your inhalers on a regular basis Continue on Flovent twice daily, rinse after use Continue on Spiriva daily Continue not to smoke Chest x-ray today Follow-up with Dr. Elsworth Soho in 4-6 weeks and as needed Please contact office for sooner follow up if symptoms do not improve or worsen or seek emergency care

## 2017-02-15 NOTE — Progress Notes (Signed)
@Patient  ID: [name] [name], female    DOB: February 15, 1927, 84 y.o.   MRN: [id number]  Chief Complaint  Patient presents with  . Follow-up    COPD     Referring provider: Burnis Medin, MD  HPI: 84 yo female smoker with COPD GOLD II w/ recurrent LLL atx .   07/2010 >>Spirometry >>FEV1 61%  She was first admitted 07/2010 for hypoxia &LLL atelectasis.  Ct chest showed Volume loss in the inferior left hemithorax with filling defect of the left lower and less so left upper lobe  Bronchoscopy showed LLL endobronchial narrowing, without discrete mass. Papule on rt mainstem bronchus was brushed - cytology all neg. BAL cx, afb, fungal neg  Re- admitted 09/2010 for pneumonia &LLL atx. Rpt bscopy >>thick mucoid secretions with difficulty passing scope. Cytology was neg for malignancy . Path showed AMYLOID DEPOSITION AND DYSTROPHIC CALCIFICATIONS.  CT sinuses 09/2010 neg  CT 06/2011.>>No residual lymphadenopathy. No evidence of metastatic breast cancer. Stable chronic lung disease with biapical scarring, diffuse calcifications of the tracheobronchial tree and mucous impaction of the lingular bronchus  Living will/DNR   02/15/2017 Follow up : COPD/PNA  , [facility] follow up  Patient presents for a post hospital follow-up.  She was recently admitted earlier this month for COPD exacerbation and suspected aspiration pneumonia versus mucus plugging.  She was treated with IV antibiotics steroids and nebulized bronchodilators Chest CT , showed  Consolidations in both lower lungs, greater on the left with small bilateral pleural effusions. Fluid or debris in the left mainstem bronchus and bilateral lower lobe bronchi. Changes likely represent aspiration pneumonia. Mild cardiac enlargement with small pericardial effusion.   She was discharged on Augmentin and a prednisone taper.  Patient is feeling much better. Cough and congestion are decreased. Has stopped smoking since discharge.  She has  underlying COPD.  She remains on Spiriva.and Flovent but admits she is not taking on regular basis .      Allergies  Allergen Reactions  . Medrol [Methylprednisolone] Other (See Comments)    Reported to have hallucinations by daughter after 2 doses of Medrol has been able to take prednisone this was in the setting of pneumonia  . Statins Other (See Comments)    Elevated liver enzymes  . Oxycodone Anxiety and Other (See Comments)    Also states, "It makes me feel like a zombie"  . Albuterol Other (See Comments)    Shakes   . Hydrocodone-Acetaminophen Nausea Only    Dizziness  . Tequin Other (See Comments)    Patient doesn't recall    Immunization History  Administered Date(s) Administered  . H1N1 04/05/2008  . Influenza Split 10/21/2011  . Influenza Whole 11/21/2006, 11/07/2007, 12/16/2008, 10/13/2009, 10/12/2010  . Influenza, High Dose Seasonal PF 10/11/2014, 11/01/2016  . Influenza,inj,Quad PF,6+ Mos 10/24/2012, 10/03/2013  . Influenza-Unspecified 10/27/2015  . Pneumococcal Conjugate-13 11/05/2014, 11/18/2015  . Pneumococcal Polysaccharide-23 12/14/2010  . Tdap 10/12/2010    Past Medical History:  Diagnosis Date  . CARCINOMA, BASAL CELL 02/13/2006  . CAROTID ARTERY DISEASE 10/03/2006   Carotid US 11/17: Stable 40-59% bilateral ICA stenosis >> f/u 1 year  . CHRONIC OBSTRUCTIVE PULMONARY DISEASE, ACUTE EXACERBATION 11/17/2009  . Chronic rhinitis 12/13/2006  . CORONARY ARTERY DISEASE 02/13/2006  . DEGENERATIVE JOINT DISEASE 09/27/2006  . DERMATITIS 09/25/2007  . Head trauma    closed  . HYPERLIPIDEMIA 03/10/2008  . HYPERTENSION 02/13/2006  . HYPOTHYROIDISM 01/30/2007  . LIVER FUNCTION TESTS, ABNORMAL 01/22/2009  . LOSS, HEARING NOS  08/25/2006  . Near syncope 09/18/2014  . NEOPLASM, SKIN, UNCERTAIN BEHAVIOR 41/32/4401  . NEPHROLITHIASIS, HX OF 02/27/2010  . OSTEOARTHRITIS, HAND 12/16/2008  . PERIPHERAL VASCULAR DISEASE 02/13/2006  . Personal history of malignant neoplasm of  breast 07/24/2008  . Personal history of urinary calculi   . Post-traumatic wound infection 12/01/2010   Mild streaking superiorly and on one day of septra  Will give rocephin and add keflex  adn follow up   . POSTHERPETIC NEURALGIA 08/25/2006  . RENAL ARTERY STENOSIS 08/01/2008  . Shingles    recurrent  . SPINAL STENOSIS 11/01/2006  . THROMBOCYTOPENIA 07/30/2008  . UNS ADVRS EFF UNS RX MEDICINAL&BIOLOGICAL SBSTNC 01/30/2007  . UNSPECIFIED ANEMIA 03/18/2008  . Unspecified vitamin D deficiency 02/07/2007  . UNSTABLE ANGINA 12/12/2009  . Vitreous detachment     Tobacco History: Social History   Tobacco Use  Smoking Status Former Smoker  . Packs/day: 1.00  . Years: 30.00  . Pack years: 30.00  . Types: Cigarettes  . Last attempt to quit: 06/15/2010  . Years since quitting: 6.6  Smokeless Tobacco Never Used  Tobacco Comment   2-3 cigs per week/// 06/30/12   Counseling given: Not Answered Comment: 2-3 cigs per week/// 06/30/12   Outpatient Encounter Medications as of 02/15/2017  Medication Sig  . amLODipine (NORVASC) 10 MG tablet Take 1 tablet (10 mg total) daily by mouth.  Marland Kitchen aspirin 81 MG tablet Take 81 mg by mouth daily.  . carboxymethylcellulose (REFRESH PLUS) 0.5 % SOLN Place 1 drop into both eyes 4 (four) times daily.  . fluticasone (FLOVENT HFA) 220 MCG/ACT inhaler Inhale 2 puffs into the lungs 2 (two) times daily.  . isosorbide mononitrate (IMDUR) 30 MG 24 hr tablet Take 1 tablet (30 mg total) by mouth daily.  Marland Kitchen levalbuterol (XOPENEX HFA) 45 MCG/ACT inhaler INHALE 2 PUFFS BY MOUTH EVERY 6 HOURS AS NEEDED FOR WHEEZING  . levalbuterol (XOPENEX) 0.63 MG/3ML nebulizer solution Take 3 mLs (0.63 mg total) by nebulization 3 (three) times daily.  . Cholecalciferol (VITAMIN D) 1000 UNITS capsule Take 1,000 Units by mouth daily.    . metoprolol succinate (TOPROL-XL) 25 MG 24 hr tablet TAKE 1 TABLET(25 MG) BY MOUTH DAILY (Patient not taking: Reported on 02/15/2017)  . nitroGLYCERIN (NITROSTAT)  0.4 MG SL tablet Place 1 tablet (0.4 mg total) under the tongue every 5 (five) minutes as needed for chest pain.  Marland Kitchen SPIRIVA HANDIHALER 18 MCG inhalation capsule INHALE THE CONTENTS OF 1 CAPSULE VIA INHALATION DEVICE EVERY DAY (Patient not taking: Reported on 02/15/2017)  . traMADol (ULTRAM) 50 MG tablet daily as needed.  . [DISCONTINUED] amoxicillin-clavulanate (AUGMENTIN) 875-125 MG tablet Take 1 tablet by mouth every 12 (twelve) hours. (Patient not taking: Reported on 02/15/2017)  . [DISCONTINUED] predniSONE (DELTASONE) 10 MG tablet Prednisone 40mg  po once daily for 3 days, then 30mg  po once daily for 3 days, then 20mg  po once daily for 3 days and then 10mg  po once daily for 3 days and stop. (Patient not taking: Reported on 02/15/2017)   No facility-administered encounter medications on file as of 02/15/2017.      Review of Systems  Constitutional:   No  weight loss, night sweats,  Fevers, chills +fatigue, or  lassitude.  HEENT:   No headaches,  Difficulty swallowing,  Tooth/dental problems, or  Sore throat,                No sneezing, itching, ear ache, nasal congestion, post nasal drip,   CV:  No chest  pain,  Orthopnea, PND, swelling in lower extremities, anasarca, dizziness, palpitations, syncope.   GI  No heartburn, indigestion, abdominal pain, nausea, vomiting, diarrhea, change in bowel habits, loss of appetite, bloody stools.   Resp:    No chest wall deformity  Skin: no rash or lesions.  GU: no dysuria, change in color of urine, no urgency or frequency.  No flank pain, no hematuria   MS:  No joint pain or swelling.  No decreased range of motion.  No back pain.    Physical Exam  BP (!) 108/58 (BP Location: Left Arm, Cuff Size: Normal)   Pulse 66   Ht 5\' 4"  (1.626 m)   Wt 104 lb 9.6 oz (47.4 kg)   SpO2 98%   BMI 17.95 kg/m   GEN: A/Ox3; pleasant , NAD, thin frail female    HEENT:  [place]/AT,  EACs-clear, TMs-wnl, NOSE-clear, THROAT-clear, no lesions, no postnasal drip or  exudate noted.   NECK:  Supple w/ fair ROM; no JVD; normal carotid impulses w/o bruits; no thyromegaly or nodules palpated; no lymphadenopathy.    RESP  Decreased BS in bases ,  no accessory muscle use, no dullness to percussion  CARD:  RRR, no m/r/g, no peripheral edema, pulses intact, no cyanosis or clubbing.  GI:   Soft & nt; nml bowel sounds; no organomegaly or masses detected.   Musco: Warm bil, no deformities or joint swelling noted.   Neuro: alert, no focal deficits noted.    Skin: Warm, no lesions or rashes    Lab Results:  CBC  BMET Ct Chest Wo Contrast  Result Date: 02/02/2017 CLINICAL DATA:  Follow-up pneumonia. EXAM: CT CHEST WITHOUT CONTRAST TECHNIQUE: Multidetector CT imaging of the chest was performed following the standard protocol without IV contrast. COMPARISON:  11/14/2015 FINDINGS: Cardiovascular: Mild cardiac enlargement. Small pericardial effusion. Coronary artery calcifications. Ectatic thoracic aorta with aortic calcifications. Mediastinum/Nodes: Esophagus is decompressed. No significant lymphadenopathy in the chest. Thyroid gland is atrophic. Debris and air-fluid levels in the left mainstem bronchus and left lower lobe bronchi. Fluid or secretions in right lower lobe bronchi as well. Diffuse bronchial wall thickening. Lungs/Pleura: Consolidations in both lower lungs, greater on the left. Small bilateral pleural effusions. Peripheral fibrosis in the lungs. Emphysematous changes in the lungs. Subpleural nodules are similar to prior study and may represent enlarged lymph nodes. No pneumothorax. Upper Abdomen: No acute abnormality. Musculoskeletal: Diffuse degenerative changes throughout the thoracic spine. No destructive lesions. IMPRESSION: 1. Consolidations in both lower lungs, greater on the left with small bilateral pleural effusions. Fluid or debris in the left mainstem bronchus and bilateral lower lobe bronchi. Changes likely represent aspiration pneumonia. 2. Mild  cardiac enlargement with small pericardial effusion. 3. Coronary artery and aortic calcifications. 4. Emphysematous changes in the lungs. Aortic Atherosclerosis (ICD10-I70.0) and Emphysema (ICD10-J43.9). Electronically Signed   By: Lucienne Capers M.D.   On: 02/02/2017 22:30   Dg Chest Port 1 View  Result Date: 01/31/2017 CLINICAL DATA:  Shortness of breath, cough, history coronary artery disease, COPD EXAM: PORTABLE CHEST 1 VIEW COMPARISON:  Portable exam 1652 hours compared to 06/18/2016 FINDINGS: Normal heart size, mediastinal contours, and pulmonary vascularity. Atherosclerotic calcification aorta. Emphysematous and bronchitic changes consistent with history of COPD. No acute infiltrate, pleural effusion or pneumothorax. Diffuse osseous demineralization with levoconvex thoracolumbar scoliosis. IMPRESSION: COPD changes without acute infiltrate. Electronically Signed   By: Lavonia Dana M.D.   On: 01/31/2017 17:15     Assessment & Plan:   COPD (chronic obstructive  pulmonary disease) ([place]) Recent exacerbation with associated pneumonia.  Patient is clinically improving.  Patient is encouraged on medication compliance and smoking cessation.  Plan  Patient Instructions  Make sure you are taking your inhalers on a regular basis Continue on Flovent twice daily, rinse after use Continue on Spiriva daily Continue not to smoke Chest x-ray today Follow-up with Dr. Elsworth Soho in 4-6 weeks and as needed Please contact office for sooner follow up if symptoms do not improve or worsen or seek emergency care       Pneumonia Bilateral pneumonia clinically improving with antibiotics.  Would like to do chest x-ray today however patient declined will re-check x-ray on return in 4 weeks.      Rexene Edison, NP 02/15/2017

## 2017-02-18 DIAGNOSIS — Z7982 Long term (current) use of aspirin: Secondary | ICD-10-CM | POA: Diagnosis not present

## 2017-02-18 DIAGNOSIS — G8929 Other chronic pain: Secondary | ICD-10-CM | POA: Diagnosis not present

## 2017-02-18 DIAGNOSIS — I2510 Atherosclerotic heart disease of native coronary artery without angina pectoris: Secondary | ICD-10-CM | POA: Diagnosis not present

## 2017-02-18 DIAGNOSIS — R64 Cachexia: Secondary | ICD-10-CM | POA: Diagnosis not present

## 2017-02-18 DIAGNOSIS — M810 Age-related osteoporosis without current pathological fracture: Secondary | ICD-10-CM | POA: Diagnosis not present

## 2017-02-18 DIAGNOSIS — I10 Essential (primary) hypertension: Secondary | ICD-10-CM | POA: Diagnosis not present

## 2017-02-18 DIAGNOSIS — R32 Unspecified urinary incontinence: Secondary | ICD-10-CM | POA: Diagnosis not present

## 2017-02-18 DIAGNOSIS — I739 Peripheral vascular disease, unspecified: Secondary | ICD-10-CM | POA: Diagnosis not present

## 2017-02-18 DIAGNOSIS — R636 Underweight: Secondary | ICD-10-CM | POA: Diagnosis not present

## 2017-02-18 DIAGNOSIS — M1990 Unspecified osteoarthritis, unspecified site: Secondary | ICD-10-CM | POA: Diagnosis not present

## 2017-03-02 DIAGNOSIS — M5136 Other intervertebral disc degeneration, lumbar region: Secondary | ICD-10-CM | POA: Diagnosis not present

## 2017-03-02 DIAGNOSIS — M961 Postlaminectomy syndrome, not elsewhere classified: Secondary | ICD-10-CM | POA: Diagnosis not present

## 2017-03-14 ENCOUNTER — Other Ambulatory Visit: Payer: Self-pay | Admitting: Cardiology

## 2017-03-14 DIAGNOSIS — I6523 Occlusion and stenosis of bilateral carotid arteries: Secondary | ICD-10-CM

## 2017-03-15 DIAGNOSIS — M961 Postlaminectomy syndrome, not elsewhere classified: Secondary | ICD-10-CM | POA: Diagnosis not present

## 2017-03-15 DIAGNOSIS — M5136 Other intervertebral disc degeneration, lumbar region: Secondary | ICD-10-CM | POA: Diagnosis not present

## 2017-03-22 ENCOUNTER — Ambulatory Visit (HOSPITAL_COMMUNITY)
Admission: RE | Admit: 2017-03-22 | Discharge: 2017-03-22 | Disposition: A | Discharge status: Home / Self Care | Payer: Medicare HMO | Source: Ambulatory Visit | Attending: Cardiology | Admitting: Cardiology

## 2017-03-22 DIAGNOSIS — I2510 Atherosclerotic heart disease of native coronary artery without angina pectoris: Secondary | ICD-10-CM | POA: Insufficient documentation

## 2017-03-22 DIAGNOSIS — I6523 Occlusion and stenosis of bilateral carotid arteries: Secondary | ICD-10-CM

## 2017-03-22 DIAGNOSIS — I708 Atherosclerosis of other arteries: Secondary | ICD-10-CM | POA: Insufficient documentation

## 2017-03-22 DIAGNOSIS — I10 Essential (primary) hypertension: Secondary | ICD-10-CM | POA: Insufficient documentation

## 2017-03-22 DIAGNOSIS — E785 Hyperlipidemia, unspecified: Secondary | ICD-10-CM | POA: Diagnosis not present

## 2017-03-22 DIAGNOSIS — Z8673 Personal history of transient ischemic attack (TIA), and cerebral infarction without residual deficits: Secondary | ICD-10-CM | POA: Diagnosis not present

## 2017-03-22 DIAGNOSIS — Z87891 Personal history of nicotine dependence: Secondary | ICD-10-CM | POA: Insufficient documentation

## 2017-03-30 ENCOUNTER — Ambulatory Visit: Payer: Self-pay | Admitting: *Deleted

## 2017-03-30 NOTE — Telephone Encounter (Signed)
Pt  Reports pain  In  r  Heel   With  Some  Redness  And  Some   Swelling     Pain is  Worse  On   Weight  Bearing  No availabilty  With   Dr  Regis Bill  . Appt  Made  With  Georgina Snell  For  tommorow      Reason for Disposition . [1] Redness of the skin AND [2] no fever  Answer Assessment - Initial Assessment Questions 1. ONSET: "When did the pain start?"      X   1   WEEK    2. LOCATION: "Where is the pain located?"       R heel    3. PAIN: "How bad is the pain?"    (Scale 1-10; or mild, moderate, severe)   -  MILD (1-3): doesn't interfere with normal activities    -  MODERATE (4-7): interferes with normal activities (e.g., work or school) or awakens from sleep, limping    -  SEVERE (8-10): excruciating pain, unable to do any normal activities, unable to walk        5 4. WORK OR EXERCISE: "Has there been any recent work or exercise that involved this part of the body?"          NO  5. CAUSE: "What do you think is causing the foot pain?"        Unknown   6. OTHER SYMPTOMS: "Do you have any other symptoms?" (e.g., leg pain, rash, fever, numbness)       R  Heel is  Inflamed   And   Slightly  Swollen    7. PREGNANCY: "Is there any chance you are pregnant?" "When was your last menstrual period?"     n/a  Protocols used: FOOT PAIN-A-AH

## 2017-03-31 ENCOUNTER — Telehealth: Payer: Self-pay | Admitting: Internal Medicine

## 2017-03-31 ENCOUNTER — Encounter: Payer: Self-pay | Admitting: Adult Health

## 2017-03-31 ENCOUNTER — Ambulatory Visit (INDEPENDENT_AMBULATORY_CARE_PROVIDER_SITE_OTHER): Payer: Medicare HMO | Admitting: Adult Health

## 2017-03-31 VITALS — BP 160/70 | HR 98 | Temp 98.3°F | Wt 100.0 lb

## 2017-03-31 DIAGNOSIS — S91309A Unspecified open wound, unspecified foot, initial encounter: Secondary | ICD-10-CM | POA: Diagnosis not present

## 2017-03-31 MED ORDER — CEPHALEXIN 500 MG PO CAPS: 500.0000 mg | ORAL_CAPSULE | Freq: Three times a day (TID) | ORAL | 0 refills | Status: AC

## 2017-03-31 NOTE — Progress Notes (Signed)
Subjective:    Patient ID: [name] [name], female    DOB: August 27, 1927, 84 y.o.   MRN: [id number]  HPI  84 year old female, patient of Dr. Regis Bill, who I am seeing today for an acute issue. She reports pain in right heel with some redness and some swelling. Pain is worse with weight bearing. She reports callused area on her right heel, that she "scrapped off with a foot scrapper." She is concerned that the wound infected.   Review of Systems See HPI   Past Medical History:  Diagnosis Date  . CARCINOMA, BASAL CELL 02/13/2006  . CAROTID ARTERY DISEASE 10/03/2006   Carotid US 11/17: Stable 40-59% bilateral ICA stenosis >> f/u 1 year  . CHRONIC OBSTRUCTIVE PULMONARY DISEASE, ACUTE EXACERBATION 11/17/2009  . Chronic rhinitis 12/13/2006  . CORONARY ARTERY DISEASE 02/13/2006  . DEGENERATIVE JOINT DISEASE 09/27/2006  . DERMATITIS 09/25/2007  . Head trauma    closed  . HYPERLIPIDEMIA 03/10/2008  . HYPERTENSION 02/13/2006  . HYPOTHYROIDISM 01/30/2007  . LIVER FUNCTION TESTS, ABNORMAL 01/22/2009  . LOSS, HEARING NOS 08/25/2006  . Near syncope 09/18/2014  . NEOPLASM, SKIN, UNCERTAIN BEHAVIOR 40/98/1191  . NEPHROLITHIASIS, HX OF 02/27/2010  . OSTEOARTHRITIS, HAND 12/16/2008  . PERIPHERAL VASCULAR DISEASE 02/13/2006  . Personal history of malignant neoplasm of breast 07/24/2008  . Personal history of urinary calculi   . Post-traumatic wound infection 12/01/2010   Mild streaking superiorly and on one day of septra  Will give rocephin and add keflex  adn follow up   . POSTHERPETIC NEURALGIA 08/25/2006  . RENAL ARTERY STENOSIS 08/01/2008  . Shingles    recurrent  . SPINAL STENOSIS 11/01/2006  . THROMBOCYTOPENIA 07/30/2008  . UNS ADVRS EFF UNS RX MEDICINAL&BIOLOGICAL SBSTNC 01/30/2007  . UNSPECIFIED ANEMIA 03/18/2008  . Unspecified vitamin D deficiency 02/07/2007  . UNSTABLE ANGINA 12/12/2009  . Vitreous detachment     Social History   Socioeconomic History  . Marital status: Widowed    Spouse  name: Not on file  . Number of children: Not on file  . Years of education: 76  . Highest education level: Not on file  Social Needs  . Financial resource strain: Not on file  . Food insecurity - worry: Not on file  . Food insecurity - inability: Not on file  . Transportation needs - medical: Not on file  . Transportation needs - non-medical: Not on file  Occupational History  . Occupation: retired  Tobacco Use  . Smoking status: Former Smoker    Packs/day: 1.00    Years: 30.00    Pack years: 30.00    Types: Cigarettes    Last attempt to quit: 06/15/2010    Years since quitting: 6.7  . Smokeless tobacco: Never Used  . Tobacco comment: 2-3 cigs per week/// 06/30/12  Substance and Sexual Activity  . Alcohol use: No    Comment: socially  . Drug use: No  . Sexual activity: Not on file  Other Topics Concern  . Not on file  Social History Narrative   Widowed   5 hours of sleep   Has friends checking on her   No Pets      Patient drinks about 6 cups of caffeine daily.   Patient is right handed.    Past Surgical History:  Procedure Laterality Date  . ABDOMINAL HYSTERECTOMY    . BREAST LUMPECTOMY    . BREAST LUMPECTOMY  1998  . CATARACT EXTRACTION    . CORONARY ANGIOPLASTY WITH STENT  PLACEMENT     bilat iliac stents, left renal artery and rt coronary artery last myoview 8/09 with EF 70%  . LUMBAR LAMINECTOMY    . SKIN CANCER EXCISION    . TONSILLECTOMY      Family History  Problem Relation Age of Onset  . Breast cancer Daughter   . Tuberculosis Father     Allergies  Allergen Reactions  . Medrol [Methylprednisolone] Other (See Comments)    Reported to have hallucinations by daughter after 2 doses of Medrol has been able to take prednisone this was in the setting of pneumonia  . Statins Other (See Comments)    Elevated liver enzymes  . Oxycodone Anxiety and Other (See Comments)    Also states, "It makes me feel like a zombie"  . Albuterol Other (See Comments)     Shakes   . Hydrocodone-Acetaminophen Nausea Only    Dizziness  . Tequin Other (See Comments)    Patient doesn't recall    Current Outpatient Medications on File Prior to Visit  Medication Sig Dispense Refill  . amLODipine (NORVASC) 10 MG tablet Take 1 tablet (10 mg total) daily by mouth. 90 tablet 0  . aspirin 81 MG tablet Take 81 mg by mouth daily.    . carboxymethylcellulose (REFRESH PLUS) 0.5 % SOLN Place 1 drop into both eyes 4 (four) times daily.    . Cholecalciferol (VITAMIN D) 1000 UNITS capsule Take 1,000 Units by mouth daily.      . fluticasone (FLOVENT HFA) 220 MCG/ACT inhaler Inhale 2 puffs into the lungs 2 (two) times daily. 1 Inhaler 2  . isosorbide mononitrate (IMDUR) 30 MG 24 hr tablet Take 1 tablet (30 mg total) by mouth daily. 90 tablet 2  . levalbuterol (XOPENEX HFA) 45 MCG/ACT inhaler INHALE 2 PUFFS BY MOUTH EVERY 6 HOURS AS NEEDED FOR WHEEZING 45 g 4  . levalbuterol (XOPENEX) 0.63 MG/3ML nebulizer solution Take 3 mLs (0.63 mg total) by nebulization 3 (three) times daily. 3 mL 12  . metoprolol succinate (TOPROL-XL) 25 MG 24 hr tablet TAKE 1 TABLET(25 MG) BY MOUTH DAILY 30 tablet 0  . SPIRIVA HANDIHALER 18 MCG inhalation capsule INHALE THE CONTENTS OF 1 CAPSULE VIA INHALATION DEVICE EVERY DAY 30 capsule 5  . traMADol (ULTRAM) 50 MG tablet daily as needed.  0  . nitroGLYCERIN (NITROSTAT) 0.4 MG SL tablet Place 1 tablet (0.4 mg total) under the tongue every 5 (five) minutes as needed for chest pain. 25 tablet 12   No current facility-administered medications on file prior to visit.     BP (!) 160/70   Pulse 98   Temp 98.3 F (36.8 C) (Oral)   Wt 100 lb (45.4 kg)   SpO2 97%   BMI 17.16 kg/m       Objective:   Physical Exam  Constitutional: She is oriented to person, place, and time. She appears well-developed and well-nourished. No distress.  Cardiovascular: Normal rate, regular rhythm, normal heart sounds and intact distal pulses. Exam reveals no gallop and no  friction rub.  No murmur heard. Pulmonary/Chest: Effort normal and breath sounds normal. No respiratory distress. She has no wheezes. She has no rales. She exhibits no tenderness.  Neurological: She is alert and oriented to person, place, and time.  Skin: Skin is warm and dry. No rash noted. She is not diaphoretic. No erythema. No pallor.  Non healing wound with black eschar center. Redness and warmth noted to right heal. Pain with palpation. No streaking  Psychiatric: She has a normal mood and affect. Her behavior is normal. Judgment and thought content normal.  Nursing note and vitals reviewed.     Assessment & Plan:  1. Nonhealing wound of heel - cephALEXin (KEFLEX) 500 MG capsule; Take 1 capsule (500 mg total) by mouth 3 (three) times daily for 10 days.  Dispense: 30 capsule; Refill: 0 - Follow up if no improvement in 2-3 days or if streak occurs.   Dorothyann Peng, NP

## 2017-03-31 NOTE — Progress Notes (Signed)
Subjective:    Patient ID: [name] [name], female    DOB: Aug 22, 1927, 84 y.o.   MRN: [id number]  HPI Presents with one week of right heel pain.  Pain has been present longer and occurred with a large callous on the heel.  One week ago she "dug" into the heel and since has had increased pain and tenderness, redness, difficulty walking and having to use a cane or walker.  While using the walker, she has changed her gait and is experiencing pain in the right calf.  She denies fever.  She is also having some numbness in the right foot and toes.    Review of Systems  Respiratory: Negative for cough, chest tightness and shortness of breath.   Cardiovascular: Negative for chest pain, palpitations and leg swelling.  Musculoskeletal: Negative.     Past Medical History:  Diagnosis Date  . CARCINOMA, BASAL CELL 02/13/2006  . CAROTID ARTERY DISEASE 10/03/2006   Carotid US 11/17: Stable 40-59% bilateral ICA stenosis >> f/u 1 year  . CHRONIC OBSTRUCTIVE PULMONARY DISEASE, ACUTE EXACERBATION 11/17/2009  . Chronic rhinitis 12/13/2006  . CORONARY ARTERY DISEASE 02/13/2006  . DEGENERATIVE JOINT DISEASE 09/27/2006  . DERMATITIS 09/25/2007  . Head trauma    closed  . HYPERLIPIDEMIA 03/10/2008  . HYPERTENSION 02/13/2006  . HYPOTHYROIDISM 01/30/2007  . LIVER FUNCTION TESTS, ABNORMAL 01/22/2009  . LOSS, HEARING NOS 08/25/2006  . Near syncope 09/18/2014  . NEOPLASM, SKIN, UNCERTAIN BEHAVIOR 66/29/4765  . NEPHROLITHIASIS, HX OF 02/27/2010  . OSTEOARTHRITIS, HAND 12/16/2008  . PERIPHERAL VASCULAR DISEASE 02/13/2006  . Personal history of malignant neoplasm of breast 07/24/2008  . Personal history of urinary calculi   . Post-traumatic wound infection 12/01/2010   Mild streaking superiorly and on one day of septra  Will give rocephin and add keflex  adn follow up   . POSTHERPETIC NEURALGIA 08/25/2006  . RENAL ARTERY STENOSIS 08/01/2008  . Shingles    recurrent  . SPINAL STENOSIS 11/01/2006  .  THROMBOCYTOPENIA 07/30/2008  . UNS ADVRS EFF UNS RX MEDICINAL&BIOLOGICAL SBSTNC 01/30/2007  . UNSPECIFIED ANEMIA 03/18/2008  . Unspecified vitamin D deficiency 02/07/2007  . UNSTABLE ANGINA 12/12/2009  . Vitreous detachment     Social History   Socioeconomic History  . Marital status: Widowed    Spouse name: Not on file  . Number of children: Not on file  . Years of education: 62  . Highest education level: Not on file  Social Needs  . Financial resource strain: Not on file  . Food insecurity - worry: Not on file  . Food insecurity - inability: Not on file  . Transportation needs - medical: Not on file  . Transportation needs - non-medical: Not on file  Occupational History  . Occupation: retired  Tobacco Use  . Smoking status: Former Smoker    Packs/day: 1.00    Years: 30.00    Pack years: 30.00    Types: Cigarettes    Last attempt to quit: 06/15/2010    Years since quitting: 6.7  . Smokeless tobacco: Never Used  . Tobacco comment: 2-3 cigs per week/// 06/30/12  Substance and Sexual Activity  . Alcohol use: No    Comment: socially  . Drug use: No  . Sexual activity: Not on file  Other Topics Concern  . Not on file  Social History Narrative   Widowed   5 hours of sleep   Has friends checking on her   No Pets      Patient  drinks about 6 cups of caffeine daily.   Patient is right handed.    Past Surgical History:  Procedure Laterality Date  . ABDOMINAL HYSTERECTOMY    . BREAST LUMPECTOMY    . BREAST LUMPECTOMY  1998  . CATARACT EXTRACTION    . CORONARY ANGIOPLASTY WITH STENT PLACEMENT     bilat iliac stents, left renal artery and rt coronary artery last myoview 8/09 with EF 70%  . LUMBAR LAMINECTOMY    . SKIN CANCER EXCISION    . TONSILLECTOMY      Family History  Problem Relation Age of Onset  . Breast cancer Daughter   . Tuberculosis Father     Allergies  Allergen Reactions  . Medrol [Methylprednisolone] Other (See Comments)    Reported to have  hallucinations by daughter after 2 doses of Medrol has been able to take prednisone this was in the setting of pneumonia  . Statins Other (See Comments)    Elevated liver enzymes  . Oxycodone Anxiety and Other (See Comments)    Also states, "It makes me feel like a zombie"  . Albuterol Other (See Comments)    Shakes   . Hydrocodone-Acetaminophen Nausea Only    Dizziness  . Tequin Other (See Comments)    Patient doesn't recall    Current Outpatient Medications on File Prior to Visit  Medication Sig Dispense Refill  . amLODipine (NORVASC) 10 MG tablet Take 1 tablet (10 mg total) daily by mouth. 90 tablet 0  . aspirin 81 MG tablet Take 81 mg by mouth daily.    . carboxymethylcellulose (REFRESH PLUS) 0.5 % SOLN Place 1 drop into both eyes 4 (four) times daily.    . Cholecalciferol (VITAMIN D) 1000 UNITS capsule Take 1,000 Units by mouth daily.      . fluticasone (FLOVENT HFA) 220 MCG/ACT inhaler Inhale 2 puffs into the lungs 2 (two) times daily. 1 Inhaler 2  . isosorbide mononitrate (IMDUR) 30 MG 24 hr tablet Take 1 tablet (30 mg total) by mouth daily. 90 tablet 2  . levalbuterol (XOPENEX HFA) 45 MCG/ACT inhaler INHALE 2 PUFFS BY MOUTH EVERY 6 HOURS AS NEEDED FOR WHEEZING 45 g 4  . levalbuterol (XOPENEX) 0.63 MG/3ML nebulizer solution Take 3 mLs (0.63 mg total) by nebulization 3 (three) times daily. 3 mL 12  . metoprolol succinate (TOPROL-XL) 25 MG 24 hr tablet TAKE 1 TABLET(25 MG) BY MOUTH DAILY 30 tablet 0  . SPIRIVA HANDIHALER 18 MCG inhalation capsule INHALE THE CONTENTS OF 1 CAPSULE VIA INHALATION DEVICE EVERY DAY 30 capsule 5  . traMADol (ULTRAM) 50 MG tablet daily as needed.  0  . nitroGLYCERIN (NITROSTAT) 0.4 MG SL tablet Place 1 tablet (0.4 mg total) under the tongue every 5 (five) minutes as needed for chest pain. 25 tablet 12   No current facility-administered medications on file prior to visit.     BP (!) 160/70   Pulse 98   Temp 98.3 F (36.8 C) (Oral)   Wt 100 lb (45.4  kg)   SpO2 97%   BMI 17.16 kg/m      Objective:   Physical Exam  Constitutional: She appears well-developed and well-nourished. No distress.  Cardiovascular: Intact distal pulses.  Pulmonary/Chest: Effort normal.  Neurological:  Decreased sensation to touch in the right foot and toes.  Skin: She is not diaphoretic.  Reddened right heel without area of induration or streaking, blanches easily.  There is an area of blackened scab where she removed the callous. No drainage,  foul odor. Capillary refill is brisk, pedal and posterior tibial pulses are present.    Nursing note and vitals reviewed.     Assessment & Plan:  1. Nonhealing wound of heel Continue with ice to area and warm epson soaks. Complete antibiotic course.  Take medications with food.  Consider referral to Podiatry if callous return.  - cephALEXin (KEFLEX) 500 MG capsule; Take 1 capsule (500 mg total) by mouth 3 (three) times daily for 10 days.  Dispense: 30 capsule; Refill: 0  Aja Whitehair C Marshia Tropea BSN RN NP student

## 2017-03-31 NOTE — Telephone Encounter (Unsigned)
Copied from [place] [id number]. Topic: Quick Communication - Rx Refill/Question >> Mar 31, 2017  3:00 PM Neva Seat wrote: Keflex   Pt went to pharmacy.  The pharmacy hasn't received the Rx request from today's visit.  [facility] - [address] [place] [address] Phone: [phone] Fax: [phone]

## 2017-04-01 ENCOUNTER — Ambulatory Visit: Payer: Medicare HMO | Admitting: Pulmonary Disease

## 2017-04-01 NOTE — Telephone Encounter (Signed)
Called pharmacy [place] and pt has picked up the med.

## 2017-04-05 ENCOUNTER — Ambulatory Visit: Payer: Medicare HMO | Admitting: Pulmonary Disease

## 2017-04-05 ENCOUNTER — Encounter: Payer: Self-pay | Admitting: Pulmonary Disease

## 2017-04-05 ENCOUNTER — Ambulatory Visit (INDEPENDENT_AMBULATORY_CARE_PROVIDER_SITE_OTHER)
Admission: RE | Admit: 2017-04-05 | Discharge: 2017-04-05 | Disposition: A | Discharge status: Home / Self Care | Payer: Medicare HMO | Source: Ambulatory Visit | Attending: Pulmonary Disease | Admitting: Pulmonary Disease

## 2017-04-05 DIAGNOSIS — J449 Chronic obstructive pulmonary disease, unspecified: Secondary | ICD-10-CM

## 2017-04-05 DIAGNOSIS — I10 Essential (primary) hypertension: Secondary | ICD-10-CM | POA: Diagnosis not present

## 2017-04-05 MED ORDER — NICOTINE 10 MG IN INHA: 1.0000 | RESPIRATORY_TRACT | 0 refills | Status: DC | PRN

## 2017-04-05 NOTE — Addendum Note (Signed)
Addended by: Valerie Salts on: 04/05/2017 02:33 PM   Modules accepted: Orders

## 2017-04-05 NOTE — Assessment & Plan Note (Signed)
CXR today.  

## 2017-04-05 NOTE — Progress Notes (Signed)
   Subjective:    Patient ID: [name] [name], female    DOB: Oct 19, 1927, 84 y.o.   MRN: [id number]  HPI  84yo female smoker with COPD GOLD II w/ recurrent LLL atx .  Living will/DNR   She was admitted 02/2017  for COPD exacerbation and suspected aspiration pneumonia versus mucus plugging.  She was treated with IV antibiotics steroids and nebulized bronchodilators.  She underwent swallow evaluation which showed esophageal dysphagia, small sips and bites recommended She has recovered well from the pneumonia and is back to her baseline.  Unfortunately she has a heel wound and is using a walker, accompanied by her neighbor [name] [name] who drove her today She continues to smoke about half pack per day   Significant tests/ events reviewed  07/2010 >>Spirometry >>FEV1 61%  She was first admitted 07/2010 for hypoxia &LLL atelectasis.  Ct chest showed Volume loss in the inferior left hemithorax with filling defect of the left lower and less so left upper lobe  Bronchoscopy showed LLL endobronchial narrowing, without discrete mass. Papule on rt mainstem bronchus was brushed - cytology all neg. BAL cx, afb, fungal neg  Re- admitted 09/2010 for pneumonia &LLL atx. Rpt bscopy >>thick mucoid secretions with difficulty passing scope. Cytology was neg for malignancy . Path showed AMYLOID DEPOSITION AND DYSTROPHIC CALCIFICATIONS.   02/2017 Chest CT , showed Consolidations in both lower lungs, greater on the left with small bilateral pleural effusions. Fluid or debris in the left mainstem bronchus and bilateral lower lobe bronchi. Changes likely represent aspiration pneumonia. Mild cardiac enlargement with small pericardial effusion.   Review of Systems Patient denies significant dyspnea,cough, hemoptysis,  chest pain, palpitations, pedal edema, orthopnea, paroxysmal nocturnal dyspnea, lightheadedness, nausea, vomiting, abdominal or  leg pains      Objective:   Physical Exam  Gen. Pleasant, thin  woman, in no distress ENT - no thrush, no post nasal drip Neck: No JVD, no thyromegaly, no carotid bruits Lungs: no use of accessory muscles, no dullness to percussion, decreased  without rales or rhonchi  Cardiovascular: Rhythm regular, heart sounds  normal, no murmurs or gallops, no peripheral edema Musculoskeletal: No deformities, no cyanosis or clubbing         Assessment & Plan:

## 2017-04-05 NOTE — Assessment & Plan Note (Signed)
Prescription for nicotine inhaler #4 She has tried nicotine patch in the past without benefit

## 2017-04-05 NOTE — Assessment & Plan Note (Signed)
Continue on Spiriva and Flovent. You have to try to quit smoking!

## 2017-04-05 NOTE — Patient Instructions (Signed)
Prescription for nicotine inhaler #4  Continue on Spiriva and Flovent. You have to try to quit smoking!

## 2017-04-06 ENCOUNTER — Other Ambulatory Visit: Payer: Self-pay | Admitting: Cardiology

## 2017-04-17 NOTE — Progress Notes (Signed)
No chief complaint on file.   HPI: [name] [name] 84 y.o. come in for  problem  Visit  She has sig  Copd  Still using tobacco  Vascular disaease and hx of osteoporosis declined medication  She has had a heel wound seen 2 28 in our office   After   Using  Sometime type of  otc  parer for the callous area on her heel.    And ever since swelling pain in heel and  Sore area without fever .   Antibiotic keflex and  Soak no better  Last time hurts to [facility] but ambulates with cane  .   No  Hx of stepping on fb  Using   Slippers with heel inserts padding .      ROS: See pertinent positives and negatives per HPI. visula limitations   So cannot tell if larger  No oozing    Past Medical History:  Diagnosis Date  . CARCINOMA, BASAL CELL 02/13/2006  . CAROTID ARTERY DISEASE 10/03/2006   Carotid US 11/17: Stable 40-59% bilateral ICA stenosis >> f/u 1 year  . CHRONIC OBSTRUCTIVE PULMONARY DISEASE, ACUTE EXACERBATION 11/17/2009  . Chronic rhinitis 12/13/2006  . CORONARY ARTERY DISEASE 02/13/2006  . DEGENERATIVE JOINT DISEASE 09/27/2006  . DERMATITIS 09/25/2007  . Head trauma    closed  . HYPERLIPIDEMIA 03/10/2008  . HYPERTENSION 02/13/2006  . HYPOTHYROIDISM 01/30/2007  . LIVER FUNCTION TESTS, ABNORMAL 01/22/2009  . LOSS, HEARING NOS 08/25/2006  . Near syncope 09/18/2014  . NEOPLASM, SKIN, UNCERTAIN BEHAVIOR 82/99/3716  . NEPHROLITHIASIS, HX OF 02/27/2010  . OSTEOARTHRITIS, HAND 12/16/2008  . PERIPHERAL VASCULAR DISEASE 02/13/2006  . Personal history of malignant neoplasm of breast 07/24/2008  . Personal history of urinary calculi   . Post-traumatic wound infection 12/01/2010   Mild streaking superiorly and on one day of septra  Will give rocephin and add keflex  adn follow up   . POSTHERPETIC NEURALGIA 08/25/2006  . RENAL ARTERY STENOSIS 08/01/2008  . Shingles    recurrent  . SPINAL STENOSIS 11/01/2006  . THROMBOCYTOPENIA 07/30/2008  . UNS ADVRS EFF UNS RX MEDICINAL&BIOLOGICAL SBSTNC 01/30/2007    . UNSPECIFIED ANEMIA 03/18/2008  . Unspecified vitamin D deficiency 02/07/2007  . UNSTABLE ANGINA 12/12/2009  . Vitreous detachment     Family History  Problem Relation Age of Onset  . Breast cancer Daughter   . Tuberculosis Father     Social History   Socioeconomic History  . Marital status: Widowed    Spouse name: None  . Number of children: None  . Years of education: 11  . Highest education level: None  Social Needs  . Financial resource strain: None  . Food insecurity - worry: None  . Food insecurity - inability: None  . Transportation needs - medical: None  . Transportation needs - non-medical: None  Occupational History  . Occupation: retired  Tobacco Use  . Smoking status: Former Smoker    Packs/day: 1.00    Years: 30.00    Pack years: 30.00    Types: Cigarettes    Last attempt to quit: 06/15/2010    Years since quitting: 6.8  . Smokeless tobacco: Never Used  . Tobacco comment: 2-3 cigs per week/// 06/30/12  Substance and Sexual Activity  . Alcohol use: No    Comment: socially  . Drug use: No  . Sexual activity: None  Other Topics Concern  . None  Social History Narrative   Widowed   5 hours of sleep  Has friends checking on her   No Pets      Patient drinks about 6 cups of caffeine daily.   Patient is right handed.    Outpatient Medications Prior to Visit  Medication Sig Dispense Refill  . amLODipine (NORVASC) 10 MG tablet Take 1 tablet (10 mg total) daily by mouth. 90 tablet 0  . aspirin 81 MG tablet Take 81 mg by mouth daily.    . carboxymethylcellulose (REFRESH PLUS) 0.5 % SOLN Place 1 drop into both eyes 4 (four) times daily.    . Cholecalciferol (VITAMIN D) 1000 UNITS capsule Take 1,000 Units by mouth daily.      . fluticasone (FLOVENT HFA) 220 MCG/ACT inhaler Inhale 2 puffs into the lungs 2 (two) times daily. 1 Inhaler 2  . isosorbide mononitrate (IMDUR) 30 MG 24 hr tablet Take 1 tablet (30 mg total) by mouth daily. 90 tablet 2  .  levalbuterol (XOPENEX HFA) 45 MCG/ACT inhaler INHALE 2 PUFFS BY MOUTH EVERY 6 HOURS AS NEEDED FOR WHEEZING 45 g 4  . levalbuterol (XOPENEX) 0.63 MG/3ML nebulizer solution Take 3 mLs (0.63 mg total) by nebulization 3 (three) times daily. 3 mL 12  . metoprolol succinate (TOPROL-XL) 25 MG 24 hr tablet TAKE 1 TABLET BY MOUTH DAILY 30 tablet 0  . nicotine (NICOTROL) 10 MG inhaler Inhale 1 Cartridge (1 continuous puffing total) into the lungs as needed for smoking cessation. 4 each 0  . SPIRIVA HANDIHALER 18 MCG inhalation capsule INHALE THE CONTENTS OF 1 CAPSULE VIA INHALATION DEVICE EVERY DAY 30 capsule 5  . traMADol (ULTRAM) 50 MG tablet daily as needed.  0  . nitroGLYCERIN (NITROSTAT) 0.4 MG SL tablet Place 1 tablet (0.4 mg total) under the tongue every 5 (five) minutes as needed for chest pain. 25 tablet 12   No facility-administered medications prior to visit.      EXAM:  BP 130/78 (BP Location: Left Arm, Patient Position: Sitting, Cuff Size: Small)   Pulse 60   Temp 98 F (36.7 C) (Oral)   Wt 102 lb 9.6 oz (46.5 kg)   SpO2 93%   BMI 17.61 kg/m   Body mass index is 17.61 kg/m.  GENERAL: vitals reviewed and listed above, alert, oriented, appears well hydrated and in no acute distress  Frail cognition intact wlaks with cane     Under weight   Odor of tobacco   rle  No  Pedal edema   Right heel plantar surface shows 2 3 mm area  Callous like ulcer  1 mm deep? Dry  Round and no oozing  There is another 2 mm  Area  No crevicing . Heel has 1 = swelling and  Diffuse mod erythema    No fb seen   PSYCH: pleasant and cooperative, no obvious depression or anxiety Lab Results  Component Value Date   WBC 12.8 (H) 02/03/2017   HGB 12.6 02/03/2017   HCT 40.6 02/03/2017   PLT 244 02/03/2017   GLUCOSE 185 (H) 02/01/2017   CHOL 160 08/02/2014   TRIG 57.0 08/02/2014   HDL 64.70 08/02/2014   LDLCALC 84 08/02/2014   ALT 26 06/13/2016   AST 39 06/13/2016   NA 141 02/01/2017   K 3.6 02/01/2017     CL 106 02/01/2017   CREATININE 0.89 02/01/2017   BUN 19 02/01/2017   CO2 26 02/01/2017   TSH 0.90 04/20/2016   INR 1.07 06/13/2016   HGBA1C 5.5 11/14/2015   BP Readings from Last 3 Encounters:  04/18/17 130/78  04/05/17 114/68  03/31/17 (!) 160/70    ASSESSMENT AND PLAN:  Discussed the following assessment and plan:  Nonhealing wound of heel - Plan: Ambulatory referral to Podiatry  Heel sore - Plan: Ambulatory referral to Podiatry  Inflammatory heel pain, right - Plan: Ambulatory referral to Podiatry  Peripheral vascular disease ([place]) - Plan: Ambulatory referral to Podiatry  Underweight frailty tobacco and pvd   Nutrition  Barriers to healing   .  Change antibiotic and referral  To podiatry   May benefit from wound clinic if on going and  Imaging study delayed because referral in progress.  -Patient advised to return or notify health care team  if  new concerns arise.  Patient Instructions   Will be  Sending in a referral to help assess the heel pain and healing  .   In the interim  Continue  Soaking  With warm not  Hot.    different antibiotic  For now .   Contact  us in interim if getting worse.     Standley Brooking. [name] M.D.

## 2017-04-18 ENCOUNTER — Ambulatory Visit (INDEPENDENT_AMBULATORY_CARE_PROVIDER_SITE_OTHER): Payer: Medicare HMO | Admitting: Internal Medicine

## 2017-04-18 ENCOUNTER — Encounter: Payer: Self-pay | Admitting: Internal Medicine

## 2017-04-18 VITALS — BP 130/78 | HR 60 | Temp 98.0°F | Wt 102.6 lb

## 2017-04-18 DIAGNOSIS — L989 Disorder of the skin and subcutaneous tissue, unspecified: Secondary | ICD-10-CM

## 2017-04-18 DIAGNOSIS — I739 Peripheral vascular disease, unspecified: Secondary | ICD-10-CM

## 2017-04-18 DIAGNOSIS — S91309A Unspecified open wound, unspecified foot, initial encounter: Secondary | ICD-10-CM | POA: Diagnosis not present

## 2017-04-18 DIAGNOSIS — M79671 Pain in right foot: Secondary | ICD-10-CM

## 2017-04-18 DIAGNOSIS — R636 Underweight: Secondary | ICD-10-CM

## 2017-04-18 MED ORDER — DOXYCYCLINE HYCLATE 100 MG PO CAPS
100.0000 mg | ORAL_CAPSULE | Freq: Two times a day (BID) | ORAL | 0 refills | Status: DC
Start: 2017-04-18 — End: 2017-08-11

## 2017-04-18 NOTE — Patient Instructions (Addendum)
  Will be  Sending in a referral to help assess the heel pain and healing  .   In the interim  Continue  Soaking  With warm not  Hot.    different antibiotic  For now .   Contact  us in interim if getting worse.

## 2017-04-21 ENCOUNTER — Encounter: Payer: Self-pay | Admitting: Podiatry

## 2017-04-21 ENCOUNTER — Ambulatory Visit: Payer: Medicare HMO | Admitting: Podiatry

## 2017-04-21 DIAGNOSIS — S91301A Unspecified open wound, right foot, initial encounter: Secondary | ICD-10-CM | POA: Diagnosis not present

## 2017-04-21 DIAGNOSIS — L84 Corns and callosities: Secondary | ICD-10-CM | POA: Diagnosis not present

## 2017-04-21 MED ORDER — MUPIROCIN 2 % EX OINT: 1.0000 "application " | TOPICAL_OINTMENT | Freq: Two times a day (BID) | CUTANEOUS | 2 refills | Status: DC

## 2017-04-21 NOTE — Patient Instructions (Signed)
Apply a small amount of antibiotic ointment and a bandage to the right heel wound daily. Keep cushion on it as well Monitor for any signs/symptoms of infection. Call the office immediately if any occur or go directly to the emergency room. Call with any questions/concerns.

## 2017-04-22 NOTE — Progress Notes (Signed)
Subjective:   Patient ID: [name] [name], female   DOB: 84 y.o.   MRN: [id number]   HPI 84 year old female presents the office with concerns of a callus to the bottom of her right heel which has been ongoing about 1 month.  She states that she tried to scrape herself and me the area worse and was more painful.  She denies any swelling or redness.  She denies any recent injury or trauma to the area or stepping on any foreign objects.  She states it was going on for some time for she try to trim it herself a month ago.  She has no other concerns today.   Review of Systems  All other systems reviewed and are negative.  Past Medical History:  Diagnosis Date  . CARCINOMA, BASAL CELL 02/13/2006  . CAROTID ARTERY DISEASE 10/03/2006   Carotid US 11/17: Stable 40-59% bilateral ICA stenosis >> f/u 1 year  . CHRONIC OBSTRUCTIVE PULMONARY DISEASE, ACUTE EXACERBATION 11/17/2009  . Chronic rhinitis 12/13/2006  . CORONARY ARTERY DISEASE 02/13/2006  . DEGENERATIVE JOINT DISEASE 09/27/2006  . DERMATITIS 09/25/2007  . Head trauma    closed  . HYPERLIPIDEMIA 03/10/2008  . HYPERTENSION 02/13/2006  . HYPOTHYROIDISM 01/30/2007  . LIVER FUNCTION TESTS, ABNORMAL 01/22/2009  . LOSS, HEARING NOS 08/25/2006  . Near syncope 09/18/2014  . NEOPLASM, SKIN, UNCERTAIN BEHAVIOR 61/44/3154  . NEPHROLITHIASIS, HX OF 02/27/2010  . OSTEOARTHRITIS, HAND 12/16/2008  . PERIPHERAL VASCULAR DISEASE 02/13/2006  . Personal history of malignant neoplasm of breast 07/24/2008  . Personal history of urinary calculi   . Post-traumatic wound infection 12/01/2010   Mild streaking superiorly and on one day of septra  Will give rocephin and add keflex  adn follow up   . POSTHERPETIC NEURALGIA 08/25/2006  . RENAL ARTERY STENOSIS 08/01/2008  . Shingles    recurrent  . SPINAL STENOSIS 11/01/2006  . THROMBOCYTOPENIA 07/30/2008  . UNS ADVRS EFF UNS RX MEDICINAL&BIOLOGICAL SBSTNC 01/30/2007  . UNSPECIFIED ANEMIA 03/18/2008  . Unspecified  vitamin D deficiency 02/07/2007  . UNSTABLE ANGINA 12/12/2009  . Vitreous detachment     Past Surgical History:  Procedure Laterality Date  . ABDOMINAL HYSTERECTOMY    . BREAST LUMPECTOMY    . BREAST LUMPECTOMY  1998  . CATARACT EXTRACTION    . CORONARY ANGIOPLASTY WITH STENT PLACEMENT     bilat iliac stents, left renal artery and rt coronary artery last myoview 8/09 with EF 70%  . LUMBAR LAMINECTOMY    . SKIN CANCER EXCISION    . TONSILLECTOMY       Current Outpatient Medications:  .  amLODipine (NORVASC) 10 MG tablet, Take 1 tablet (10 mg total) daily by mouth., Disp: 90 tablet, Rfl: 0 .  aspirin 81 MG tablet, Take 81 mg by mouth daily., Disp: , Rfl:  .  carboxymethylcellulose (REFRESH PLUS) 0.5 % SOLN, Place 1 drop into both eyes 4 (four) times daily., Disp: , Rfl:  .  Cholecalciferol (VITAMIN D) 1000 UNITS capsule, Take 1,000 Units by mouth daily.  , Disp: , Rfl:  .  doxycycline (VIBRAMYCIN) 100 MG capsule, Take 1 capsule (100 mg total) by mouth 2 (two) times daily., Disp: 20 capsule, Rfl: 0 .  fluticasone (FLOVENT HFA) 220 MCG/ACT inhaler, Inhale 2 puffs into the lungs 2 (two) times daily., Disp: 1 Inhaler, Rfl: 2 .  isosorbide mononitrate (IMDUR) 30 MG 24 hr tablet, Take 1 tablet (30 mg total) by mouth daily., Disp: 90 tablet, Rfl: 2 .  levalbuterol (XOPENEX  HFA) 45 MCG/ACT inhaler, INHALE 2 PUFFS BY MOUTH EVERY 6 HOURS AS NEEDED FOR WHEEZING, Disp: 45 g, Rfl: 4 .  levalbuterol (XOPENEX) 0.63 MG/3ML nebulizer solution, Take 3 mLs (0.63 mg total) by nebulization 3 (three) times daily., Disp: 3 mL, Rfl: 12 .  metoprolol succinate (TOPROL-XL) 25 MG 24 hr tablet, TAKE 1 TABLET BY MOUTH DAILY, Disp: 30 tablet, Rfl: 0 .  mupirocin ointment (BACTROBAN) 2 %, Apply 1 application topically 2 (two) times daily., Disp: 30 g, Rfl: 2 .  nicotine (NICOTROL) 10 MG inhaler, Inhale 1 Cartridge (1 continuous puffing total) into the lungs as needed for smoking cessation., Disp: 4 each, Rfl: 0 .   nitroGLYCERIN (NITROSTAT) 0.4 MG SL tablet, Place 1 tablet (0.4 mg total) under the tongue every 5 (five) minutes as needed for chest pain., Disp: 25 tablet, Rfl: 12 .  SPIRIVA HANDIHALER 18 MCG inhalation capsule, INHALE THE CONTENTS OF 1 CAPSULE VIA INHALATION DEVICE EVERY DAY, Disp: 30 capsule, Rfl: 5 .  traMADol (ULTRAM) 50 MG tablet, daily as needed., Disp: , Rfl: 0  Allergies  Allergen Reactions  . Medrol [Methylprednisolone] Other (See Comments)    Reported to have hallucinations by daughter after 2 doses of Medrol has been able to take prednisone this was in the setting of pneumonia  . Statins Other (See Comments)    Elevated liver enzymes  . Oxycodone Anxiety and Other (See Comments)    Also states, "It makes me feel like a zombie"  . Albuterol Other (See Comments)    Shakes   . Hydrocodone-Acetaminophen Nausea Only    Dizziness  . Tequin Other (See Comments)    Patient doesn't recall    Social History   Socioeconomic History  . Marital status: Widowed    Spouse name: Not on file  . Number of children: Not on file  . Years of education: 19  . Highest education level: Not on file  Occupational History  . Occupation: retired  Scientific laboratory technician  . Financial resource strain: Not on file  . Food insecurity:    Worry: Not on file    Inability: Not on file  . Transportation needs:    Medical: Not on file    Non-medical: Not on file  Tobacco Use  . Smoking status: Former Smoker    Packs/day: 1.00    Years: 30.00    Pack years: 30.00    Types: Cigarettes    Last attempt to quit: 06/15/2010    Years since quitting: 6.8  . Smokeless tobacco: Never Used  . Tobacco comment: 2-3 cigs per week/// 06/30/12  Substance and Sexual Activity  . Alcohol use: No    Comment: socially  . Drug use: No  . Sexual activity: Not on file  Lifestyle  . Physical activity:    Days per week: Not on file    Minutes per session: Not on file  . Stress: Not on file  Relationships  . Social  connections:    Talks on phone: Not on file    Gets together: Not on file    Attends religious service: Not on file    Active member of club or organization: Not on file    Attends meetings of clubs or organizations: Not on file    Relationship status: Not on file  . Intimate partner violence:    Fear of current or ex partner: Not on file    Emotionally abused: Not on file    Physically abused: Not on file  Forced sexual activity: Not on file  Other Topics Concern  . Not on file  Social History Narrative   Widowed   5 hours of sleep   Has friends checking on her   No Pets      Patient drinks about 6 cups of caffeine daily.   Patient is right handed.        Objective:  Physical Exam  General: AAO x3, NAD  Dermatological: Hyperkeratotic lesion present in the posterior aspect of the right heel on the posterior inferior aspect.  There are 2 small satellite hyperkeratotic lesions and upon debridement there is no underlying ulceration, drainage or any signs of infection.  Junction to the more central posterior 1 does appear to be a small superficial opening with a granular base.  There is no fluctuation or crepitation.  There is no sign of erythema, ascending cellulitis.  There is no fluctuation or crepitation.  There is no malodor.  Vascular: Dorsalis Pedis artery and Posterior Tibial artery pedal pulses are 2/4 bilateral with immedate capillary fill time. There is no pain with calf compression, swelling, warmth, erythema.   Neruologic: Grossly intact via light touch bilateral. . Protective threshold with Semmes Wienstein monofilament intact to all pedal sites bilateral.   Musculoskeletal: No gross boney pedal deformities bilateral. No pain, crepitus, or limitation noted with foot and ankle range of motion bilateral. Muscular strength 5/5 in all groups tested bilateral.  Gait: Unassisted, Nonantalgic.     Assessment:   39 old female right posterior heel ulceration,  hyperkeratotic lesions    Plan:  -Treatment options discussed including all alternatives, risks, and complications -Etiology of symptoms were discussed -I sharply debrided the hyperkeratotic lesions without any complications or bleeding..  Prescribed mupirocin ointment.There is a small central posterior heel wound.  I did apply antibiotic ointment and appearance today.  Offloading pads at all times.  No signs of infection from what of an oral antibiotics today continue to monitor closely. -Follow-up as scheduled or sooner if needed  Trula Slade DPM

## 2017-05-05 ENCOUNTER — Ambulatory Visit (INDEPENDENT_AMBULATORY_CARE_PROVIDER_SITE_OTHER): Payer: Medicare HMO

## 2017-05-05 ENCOUNTER — Ambulatory Visit: Payer: Medicare HMO | Admitting: Podiatry

## 2017-05-05 DIAGNOSIS — L97519 Non-pressure chronic ulcer of other part of right foot with unspecified severity: Secondary | ICD-10-CM

## 2017-05-05 DIAGNOSIS — S90851A Superficial foreign body, right foot, initial encounter: Secondary | ICD-10-CM | POA: Diagnosis not present

## 2017-05-06 ENCOUNTER — Other Ambulatory Visit: Payer: Self-pay

## 2017-05-06 DIAGNOSIS — S90851A Superficial foreign body, right foot, initial encounter: Secondary | ICD-10-CM

## 2017-05-06 NOTE — Progress Notes (Signed)
Subjective: 84 year old female presents to the office today for follow-up of right heel pain, ulceration and continued pain.  She denies any redness or drainage or any swelling, from the area.  She states the area still tender with pressure. Denies any systemic complaints such as fevers, chills, nausea, vomiting. No acute changes since last appointment, and no other complaints at this time.   Objective: AAO x3, NAD DP/PT pulses palpable bilaterally, CRT less than 3 seconds To the posterior plantar aspect of the right heel is a hyperkeratotic lesion.  Appears that the underlying ulceration is healed.  There is no surrounding erythema, ascending cellulitis but there is still callus formation.  There is still tenderness palpation on the central hyperkeratotic lesion with a wound is present.  There is no drainage or pus there is no clinical signs of infection. No open lesions or pre-ulcerative lesions.  No pain with calf compression, swelling, warmth, erythema  Assessment: 84 year old female with right heel wound with continued pain  Plan: -All treatment options discussed with the patient including all alternatives, risks, complications.  -X-rays were obtained and reviewed.  There appears to be a questionable foreign body in the plantar aspect of the heel.  Because this will order an ultrasound to evaluate for the foreign object. -I did debride the hyperkeratotic tissue today without any complications or bleeding.  Continue with small amount of antibiotic ointment over the area daily.  Continue offloading at all times.  Monitoring signs or symptoms of infection. -Patient encouraged to call the office with any questions, concerns, change in symptoms.   Trula Slade DPM

## 2017-05-06 NOTE — Progress Notes (Signed)
ultra

## 2017-05-09 ENCOUNTER — Telehealth: Payer: Self-pay | Admitting: *Deleted

## 2017-05-09 NOTE — Telephone Encounter (Signed)
Faxed orders to [facility]. 

## 2017-05-09 NOTE — Telephone Encounter (Signed)
-----   Message from Trula Slade, DPM sent at 05/06/2017  7:32 AM EDT ----- Can you please order an ultrasound of the right heel to rule out foreign body? Thanks.

## 2017-05-10 ENCOUNTER — Other Ambulatory Visit: Payer: Self-pay | Admitting: Podiatry

## 2017-05-11 ENCOUNTER — Other Ambulatory Visit: Payer: Self-pay

## 2017-05-11 DIAGNOSIS — L97519 Non-pressure chronic ulcer of other part of right foot with unspecified severity: Secondary | ICD-10-CM

## 2017-05-11 DIAGNOSIS — S90851A Superficial foreign body, right foot, initial encounter: Secondary | ICD-10-CM

## 2017-05-11 NOTE — Progress Notes (Signed)
[place]

## 2017-05-30 ENCOUNTER — Other Ambulatory Visit: Payer: Self-pay | Admitting: Cardiology

## 2017-05-30 NOTE — Telephone Encounter (Signed)
Rx sent to pharmacy   

## 2017-06-10 ENCOUNTER — Telehealth: Payer: Self-pay | Admitting: *Deleted

## 2017-06-10 NOTE — Telephone Encounter (Signed)
[facility] [place] states they would like to know what Dr. Jacqualyn Posey is looking for on pt.

## 2017-06-10 NOTE — Telephone Encounter (Signed)
[place] asked what Dr. Jacqualyn Posey was looking. I reviewed the 05/05/2017 and Dr. Jacqualyn Posey states x-ray show a foreign body.

## 2017-06-15 ENCOUNTER — Ambulatory Visit
Admission: RE | Admit: 2017-06-15 | Discharge: 2017-06-15 | Disposition: A | Discharge status: Home / Self Care | Payer: Medicare HMO | Source: Ambulatory Visit | Attending: Podiatry | Admitting: Podiatry

## 2017-06-15 DIAGNOSIS — M79671 Pain in right foot: Secondary | ICD-10-CM | POA: Diagnosis not present

## 2017-06-15 DIAGNOSIS — L97519 Non-pressure chronic ulcer of other part of right foot with unspecified severity: Secondary | ICD-10-CM

## 2017-06-15 DIAGNOSIS — S90851A Superficial foreign body, right foot, initial encounter: Secondary | ICD-10-CM

## 2017-06-16 ENCOUNTER — Telehealth: Payer: Self-pay | Admitting: *Deleted

## 2017-06-16 NOTE — Telephone Encounter (Signed)
Left message for pt to call for the results of the [place].

## 2017-06-16 NOTE — Telephone Encounter (Signed)
Pt called for results. I called pt and informed of Dr. Leigh Aurora review of results. Pt states she does still have a callous over the area and would like to be seen. Transferred pt to schedulers.

## 2017-06-16 NOTE — Telephone Encounter (Signed)
-----   Message from Trula Slade, DPM sent at 06/15/2017  4:36 PM EDT ----- Val- please let her know that the ultrasound is negative for foreign body. If she is still having any issues, please schedule her a follow-up. Thanks.

## 2017-06-29 DIAGNOSIS — M5136 Other intervertebral disc degeneration, lumbar region: Secondary | ICD-10-CM | POA: Diagnosis not present

## 2017-06-29 DIAGNOSIS — M5416 Radiculopathy, lumbar region: Secondary | ICD-10-CM | POA: Diagnosis not present

## 2017-07-01 ENCOUNTER — Ambulatory Visit: Payer: Medicare HMO | Admitting: Podiatry

## 2017-07-08 ENCOUNTER — Other Ambulatory Visit: Payer: Self-pay | Admitting: *Deleted

## 2017-07-08 DIAGNOSIS — I6523 Occlusion and stenosis of bilateral carotid arteries: Secondary | ICD-10-CM

## 2017-07-14 DIAGNOSIS — M5136 Other intervertebral disc degeneration, lumbar region: Secondary | ICD-10-CM | POA: Diagnosis not present

## 2017-07-14 DIAGNOSIS — M961 Postlaminectomy syndrome, not elsewhere classified: Secondary | ICD-10-CM | POA: Diagnosis not present

## 2017-08-01 ENCOUNTER — Other Ambulatory Visit: Payer: Self-pay | Admitting: Cardiology

## 2017-08-05 ENCOUNTER — Ambulatory Visit: Payer: Medicare HMO | Admitting: Adult Health

## 2017-08-10 ENCOUNTER — Other Ambulatory Visit: Payer: Self-pay | Admitting: *Deleted

## 2017-08-10 ENCOUNTER — Other Ambulatory Visit: Payer: Self-pay | Admitting: Cardiology

## 2017-08-10 MED ORDER — AMLODIPINE BESYLATE 10 MG PO TABS: 10.0000 mg | ORAL_TABLET | Freq: Every day | ORAL | 0 refills | Status: DC

## 2017-08-11 ENCOUNTER — Ambulatory Visit: Payer: Medicare HMO | Admitting: Physician Assistant

## 2017-08-11 ENCOUNTER — Encounter: Payer: Self-pay | Admitting: Physician Assistant

## 2017-08-11 VITALS — BP 120/70 | HR 61 | Ht 64.0 in | Wt 102.6 lb

## 2017-08-11 DIAGNOSIS — J449 Chronic obstructive pulmonary disease, unspecified: Secondary | ICD-10-CM | POA: Diagnosis not present

## 2017-08-11 DIAGNOSIS — E039 Hypothyroidism, unspecified: Secondary | ICD-10-CM | POA: Diagnosis not present

## 2017-08-11 DIAGNOSIS — I10 Essential (primary) hypertension: Secondary | ICD-10-CM | POA: Diagnosis not present

## 2017-08-11 DIAGNOSIS — Z720 Tobacco use: Secondary | ICD-10-CM | POA: Diagnosis not present

## 2017-08-11 DIAGNOSIS — E785 Hyperlipidemia, unspecified: Secondary | ICD-10-CM

## 2017-08-11 DIAGNOSIS — I6523 Occlusion and stenosis of bilateral carotid arteries: Secondary | ICD-10-CM | POA: Diagnosis not present

## 2017-08-11 DIAGNOSIS — I2510 Atherosclerotic heart disease of native coronary artery without angina pectoris: Secondary | ICD-10-CM | POA: Diagnosis not present

## 2017-08-11 DIAGNOSIS — I739 Peripheral vascular disease, unspecified: Secondary | ICD-10-CM | POA: Diagnosis not present

## 2017-08-11 MED ORDER — METOPROLOL SUCCINATE ER 25 MG PO TB24: 25.0000 mg | ORAL_TABLET | Freq: Every day | ORAL | 0 refills | Status: DC

## 2017-08-11 MED ORDER — ISOSORBIDE MONONITRATE ER 30 MG PO TB24: 30.0000 mg | ORAL_TABLET | Freq: Every day | ORAL | 3 refills | Status: DC

## 2017-08-11 MED ORDER — AMLODIPINE BESYLATE 10 MG PO TABS: 10.0000 mg | ORAL_TABLET | Freq: Every day | ORAL | 3 refills | Status: DC

## 2017-08-11 NOTE — Progress Notes (Signed)
Cardiology Office Note    Date:  08/13/2017   ID:  [name] [name], DOB [birth year], MRN [id number]  PCP:  Burnis Medin, MD  Cardiologist:  Dr. Stanford Breed   Chief Complaint  Patient presents with  . Follow-up    seen for Dr. Stanford Breed.     History of Present Illness:  [name] [name] is a 84 y.o. female with PMH of COPD, tobacco abuse, CAD, HTN, HLD, hypothyroidism, history of renal artery stenosis, PVD, and history of spinal stenosis.  She had a stenting of RCA in 2005.  Last cardiac catheterization in November 2011 showed patent left main, mild LAD irregularities, 25% mid left circumflex, 25% ostial D1, patent mid RCA stent with 30% in-stent restenosis, EF 65%.  She also has known peripheral arterial disease with occluded left SFA may that was unfavorable for intervention and was treated medically.  ABI in May 2016 was 0.58 on the right and 0.66 on the left.  Echo obtained on 11/16/2015 showed EF 60 to 65%, grade 1 DD, mild aortic regurgitation, mild MR, severely dilated left atrium, moderate TR, PA peak pressure 43 mmHg.  Last carotid Doppler obtained on 03/22/2017 showed a 40 to 59% disease bilaterally, recommend repeat imaging in 1 year.  She is intolerant to statins.  Her last office visit was in December 2017, at which time she was doing well.  She was admitted in January 2019 for COPD exacerbation and suspected to have some degree of aspiration pneumonia versus mucous plugging.  She was treated with with IV antibiotic.  She underwent swallow eval which showed esophageal dysphasia, small sips and bites were recommended.  She is also being followed by podiatry for right heel ulcer.  Soft tissue ultrasound of the right heel obtained on 06/14/2017 was negative for foreign object.  Patient presents today for cardiology office visit.  She continued to smoke at this time, and has no wish to quit.  She lives by herself and capable of taking care of herself. She was started to have some  neck discomfort after prolonged walking, however does not have any discomfort with everyday walking.  Does not have any chest discomfort, lower extremity edema, orthopnea or PND.  She is due for fasting lipid panel.    Past Medical History:  Diagnosis Date  . CARCINOMA, BASAL CELL 02/13/2006  . CAROTID ARTERY DISEASE 10/03/2006   Carotid US 11/17: Stable 40-59% bilateral ICA stenosis >> f/u 1 year  . CHRONIC OBSTRUCTIVE PULMONARY DISEASE, ACUTE EXACERBATION 11/17/2009  . Chronic rhinitis 12/13/2006  . CORONARY ARTERY DISEASE 02/13/2006  . DEGENERATIVE JOINT DISEASE 09/27/2006  . DERMATITIS 09/25/2007  . Head trauma    closed  . HYPERLIPIDEMIA 03/10/2008  . HYPERTENSION 02/13/2006  . HYPOTHYROIDISM 01/30/2007  . LIVER FUNCTION TESTS, ABNORMAL 01/22/2009  . LOSS, HEARING NOS 08/25/2006  . Near syncope 09/18/2014  . NEOPLASM, SKIN, UNCERTAIN BEHAVIOR 92/[id number]/6834  . NEPHROLITHIASIS, HX OF 02/27/2010  . OSTEOARTHRITIS, HAND 12/16/2008  . PERIPHERAL VASCULAR DISEASE 02/13/2006  . Personal history of malignant neoplasm of breast 07/24/2008  . Personal history of urinary calculi   . Post-traumatic wound infection 12/01/2010   Mild streaking superiorly and on one day of septra  Will give rocephin and add keflex  adn follow up   . POSTHERPETIC NEURALGIA 08/25/2006  . RENAL ARTERY STENOSIS 08/01/2008  . Shingles    recurrent  . SPINAL STENOSIS 11/01/2006  . THROMBOCYTOPENIA 07/30/2008  . UNS ADVRS EFF UNS RX MEDICINAL&BIOLOGICAL SBSTNC 01/30/2007  . UNSPECIFIED  ANEMIA 03/18/2008  . Unspecified vitamin D deficiency 02/07/2007  . UNSTABLE ANGINA 12/12/2009  . Vitreous detachment     Past Surgical History:  Procedure Laterality Date  . ABDOMINAL HYSTERECTOMY    . BREAST LUMPECTOMY    . BREAST LUMPECTOMY  1998  . CATARACT EXTRACTION    . CORONARY ANGIOPLASTY WITH STENT PLACEMENT     bilat iliac stents, left renal artery and rt coronary artery last myoview 8/09 with EF 70%  . LUMBAR LAMINECTOMY    .  SKIN CANCER EXCISION    . TONSILLECTOMY      Current Medications: Outpatient Medications Prior to Visit  Medication Sig Dispense Refill  . aspirin 81 MG tablet Take 81 mg by mouth daily.    . carboxymethylcellulose (REFRESH PLUS) 0.5 % SOLN Place 1 drop into both eyes 4 (four) times daily.    . Cholecalciferol (VITAMIN D) 1000 UNITS capsule Take 1,000 Units by mouth daily.      . fluticasone (FLOVENT HFA) 220 MCG/ACT inhaler Inhale 2 puffs into the lungs 2 (two) times daily. 1 Inhaler 2  . levalbuterol (XOPENEX HFA) 45 MCG/ACT inhaler INHALE 2 PUFFS BY MOUTH EVERY 6 HOURS AS NEEDED FOR WHEEZING 45 g 4  . levalbuterol (XOPENEX) 0.63 MG/3ML nebulizer solution Take 3 mLs (0.63 mg total) by nebulization 3 (three) times daily. 3 mL 12  . mupirocin ointment (BACTROBAN) 2 % Apply 1 application topically 2 (two) times daily. 30 g 2  . nicotine (NICOTROL) 10 MG inhaler Inhale 1 Cartridge (1 continuous puffing total) into the lungs as needed for smoking cessation. 4 each 0  . SPIRIVA HANDIHALER 18 MCG inhalation capsule INHALE THE CONTENTS OF 1 CAPSULE VIA INHALATION DEVICE EVERY DAY 30 capsule 5  . traMADol (ULTRAM) 50 MG tablet daily as needed.  0  . amLODipine (NORVASC) 10 MG tablet TAKE 1 TABLET BY MOUTH DAILY 90 tablet 0  . isosorbide mononitrate (IMDUR) 30 MG 24 hr tablet Take 1 tablet (30 mg total) by mouth daily. Please call [phone] to schedule appointment for additional refills thanks. 30 tablet 1  . metoprolol succinate (TOPROL-XL) 25 MG 24 hr tablet Take 1 tablet (25 mg total) by mouth daily. NEEDS APPOINTMENT FOR FUTURE REFILLS 30 tablet 0  . nitroGLYCERIN (NITROSTAT) 0.4 MG SL tablet Place 1 tablet (0.4 mg total) under the tongue every 5 (five) minutes as needed for chest pain. 25 tablet 12  . doxycycline (VIBRAMYCIN) 100 MG capsule Take 1 capsule (100 mg total) by mouth 2 (two) times daily. 20 capsule 0   No facility-administered medications prior to visit.      Allergies:   Medrol  [methylprednisolone]; Statins; Oxycodone; Albuterol; Hydrocodone-acetaminophen; and Tequin   Social History   Socioeconomic History  . Marital status: Widowed    Spouse name: Not on file  . Number of children: Not on file  . Years of education: 33  . Highest education level: Not on file  Occupational History  . Occupation: retired  Scientific laboratory technician  . Financial resource strain: Not on file  . Food insecurity:    Worry: Not on file    Inability: Not on file  . Transportation needs:    Medical: Not on file    Non-medical: Not on file  Tobacco Use  . Smoking status: Former Smoker    Packs/day: 1.00    Years: 30.00    Pack years: 30.00    Types: Cigarettes    Last attempt to quit: 06/15/2010    Years  since quitting: 7.1  . Smokeless tobacco: Never Used  . Tobacco comment: 2-3 cigs per week/// 06/30/12  Substance and Sexual Activity  . Alcohol use: No    Comment: socially  . Drug use: No  . Sexual activity: Not on file  Lifestyle  . Physical activity:    Days per week: Not on file    Minutes per session: Not on file  . Stress: Not on file  Relationships  . Social connections:    Talks on phone: Not on file    Gets together: Not on file    Attends religious service: Not on file    Active member of club or organization: Not on file    Attends meetings of clubs or organizations: Not on file    Relationship status: Not on file  Other Topics Concern  . Not on file  Social History Narrative   Widowed   5 hours of sleep   Has friends checking on her   No Pets      Patient drinks about 6 cups of caffeine daily.   Patient is right handed.     Family History:  The patient's family history includes Breast cancer in her daughter; Tuberculosis in her father.   ROS:   Please see the history of present illness.    ROS All other systems reviewed and are negative.   PHYSICAL EXAM:   VS:  BP 120/70   Pulse 61   Ht 5\' 4"  (1.626 m)   Wt 102 lb 9.6 oz (46.5 kg)   BMI 17.61  kg/m    GEN: Well nourished, well developed, in no acute distress  HEENT: normal  Neck: no JVD, carotid bruits, or masses Cardiac: RRR; no murmurs, rubs, or gallops,no edema  Respiratory:  clear to auscultation bilaterally, normal work of breathing GI: soft, nontender, nondistended, + BS MS: no deformity or atrophy  Skin: warm and dry, no rash Neuro:  Alert and Oriented x 3, Strength and sensation are intact Psych: euthymic mood, full affect  Wt Readings from Last 3 Encounters:  08/11/17 102 lb 9.6 oz (46.5 kg)  04/18/17 102 lb 9.6 oz (46.5 kg)  04/05/17 112 lb (50.8 kg)      Studies/Labs Reviewed:   EKG:  EKG is ordered today.  The ekg ordered today demonstrates normal sinus rhythm, poor progression anterior leads, occasional PACs.  Recent Labs: 02/01/2017: BUN 19; Creatinine, Ser 0.89; Potassium 3.6; Sodium 141 02/03/2017: B Natriuretic Peptide 378.7; Hemoglobin 12.6; Platelets 244   Lipid Panel    Component Value Date/Time   CHOL 160 08/02/2014 0951   TRIG 57.0 08/02/2014 0951   TRIG 83 12/14/2005 1011   HDL 64.70 08/02/2014 0951   CHOLHDL 2 08/02/2014 0951   VLDL 11.4 08/02/2014 0951   LDLCALC 84 08/02/2014 0951    Additional studies/ records that were reviewed today include:   Echo 11/16/2015 LV EF: 60% -   65% Study Conclusions  - Left ventricle: The cavity size was normal. Wall thickness was   normal. Systolic function was normal. The estimated ejection   fraction was in the range of 60% to 65%. Wall motion was normal;   there were no regional wall motion abnormalities. Doppler   parameters are consistent with abnormal left ventricular   relaxation (grade 1 diastolic dysfunction). Doppler parameters   are consistent with high ventricular filling pressure. - Aortic valve: Trileaflet; mildly thickened leaflets. Sclerosis   without stenosis. There was mild regurgitation. - Mitral valve: There was  mild regurgitation. - Left atrium: The atrium was severely  dilated. - Right atrium: The atrium was mildly dilated. - Tricuspid valve: Mildly thickened leaflets. There was moderate   regurgitation. - Pulmonary arteries: PA peak pressure: 43 mm Hg (S). - Pericardium, extracardiac: A trivial pericardial effusion was   identified.   Carotid US 03/22/2017 Final Interpretation: Right Carotid: Velocities in the right ICA are consistent with a 40-59%        stenosis. Non-hemodynamically significant plaque <50% noted in        the CCA. The ECA appears >50% stenosed.  Left Carotid: Velocities in the left ICA are consistent with a 40-59% stenosis.       Non-hemodynamically significant plaque noted in the CCA.  Vertebrals: Both vertebral arteries were patent with antegrade flow. Subclavians: Left subclavian artery was stenotic. Normal flow hemodynamics were       seen in the right subclavian artery.   ASSESSMENT:    1. Coronary artery disease involving native coronary artery of native heart without angina pectoris   2. Chronic obstructive pulmonary disease, unspecified COPD type ([place])   3. Tobacco abuse   4. Essential hypertension   5. Hyperlipidemia, unspecified hyperlipidemia type   6. Hypothyroidism, unspecified type   7. PAD (peripheral artery disease) ([place])   8. Bilateral carotid artery stenosis      PLAN:  In order of problems listed above:  1. CAD: Denies any chest pain.  Continue aspirin.  2. Tobacco abuse: Continue to smoke, patient does not have any wish to quit at this time  3. PAD: Abnormal ABI in 2016, however patient does not appears to have significant symptoms of claudication.  At her current activity level, she does not have any leg pain with walking.  If her symptom worsens, may consider repeat ABI.  4. Carotid artery disease: Last carotid ultrasound was in February 2019 which showed moderate disease bilaterally.  Repeat imaging in 1 year  5. Hypertension: Blood pressure very well  controlled  6. Hyperlipidemia: Repeat fasting lipid panel, currently not on statin.  7. Hypothyroidism: On Synthroid, managed by primary care provider    Medication Adjustments/Labs and Tests Ordered: Current medicines are reviewed at length with the patient today.  Concerns regarding medicines are outlined above.  Medication changes, Labs and Tests ordered today are listed in the Patient Instructions below. Patient Instructions  Medication Instructions:  Your physician recommends that you continue on your current medications as directed. Please refer to the Current Medication list given to you today.  Labwork: Your physician recommends that you return for lab work in: [place]  Testing/Procedures: None   Follow-Up: Your physician wants you to follow-up in: 6 months with Dr Stanford Breed. You will receive a reminder letter in the mail two months in advance. If you don't receive a letter, please call our office to schedule the follow-up appointment.  Any Other Special Instructions Will Be Listed Below (If Applicable). If you need a refill on your cardiac medications before your next appointment, please call your pharmacy.     [name] [name], [place]  08/13/2017 11:00 PM    [address], [place], [place]  [address] Phone: [phone]; Fax: [phone]

## 2017-08-11 NOTE — Patient Instructions (Signed)
Medication Instructions:  Your physician recommends that you continue on your current medications as directed. Please refer to the Current Medication list given to you today.  Labwork: Your physician recommends that you return for lab work in: [place]  Testing/Procedures: None   Follow-Up: Your physician wants you to follow-up in: 6 months with Dr Stanford Breed. You will receive a reminder letter in the mail two months in advance. If you don't receive a letter, please call our office to schedule the follow-up appointment.  Any Other Special Instructions Will Be Listed Below (If Applicable). If you need a refill on your cardiac medications before your next appointment, please call your pharmacy.

## 2017-08-13 ENCOUNTER — Encounter: Payer: Self-pay | Admitting: Physician Assistant

## 2017-08-13 ENCOUNTER — Other Ambulatory Visit: Payer: Self-pay | Admitting: Physician Assistant

## 2017-08-15 ENCOUNTER — Ambulatory Visit: Payer: Medicare HMO | Admitting: Adult Health

## 2017-08-16 ENCOUNTER — Ambulatory Visit: Payer: Medicare HMO | Admitting: Adult Health

## 2017-08-24 ENCOUNTER — Encounter: Payer: Self-pay | Admitting: Internal Medicine

## 2017-08-24 DIAGNOSIS — C44629 Squamous cell carcinoma of skin of left upper limb, including shoulder: Secondary | ICD-10-CM | POA: Diagnosis not present

## 2017-08-24 DIAGNOSIS — D485 Neoplasm of uncertain behavior of skin: Secondary | ICD-10-CM | POA: Diagnosis not present

## 2017-08-24 DIAGNOSIS — L309 Dermatitis, unspecified: Secondary | ICD-10-CM | POA: Diagnosis not present

## 2017-08-24 HISTORY — PX: SKIN BIOPSY: SHX1

## 2017-08-31 ENCOUNTER — Ambulatory Visit: Payer: Medicare HMO | Admitting: Adult Health

## 2017-09-20 ENCOUNTER — Encounter: Payer: Self-pay | Admitting: *Deleted

## 2017-10-14 ENCOUNTER — Other Ambulatory Visit: Payer: Self-pay | Admitting: Physician Assistant

## 2017-10-14 ENCOUNTER — Other Ambulatory Visit: Payer: Self-pay | Admitting: Cardiology

## 2017-10-15 ENCOUNTER — Other Ambulatory Visit: Payer: Self-pay | Admitting: Internal Medicine

## 2017-11-11 DIAGNOSIS — M961 Postlaminectomy syndrome, not elsewhere classified: Secondary | ICD-10-CM | POA: Diagnosis not present

## 2017-11-11 DIAGNOSIS — M5136 Other intervertebral disc degeneration, lumbar region: Secondary | ICD-10-CM | POA: Diagnosis not present

## 2017-11-11 DIAGNOSIS — G894 Chronic pain syndrome: Secondary | ICD-10-CM | POA: Diagnosis not present

## 2017-11-11 DIAGNOSIS — R69 Illness, unspecified: Secondary | ICD-10-CM | POA: Diagnosis not present

## 2017-11-14 DIAGNOSIS — D485 Neoplasm of uncertain behavior of skin: Secondary | ICD-10-CM | POA: Diagnosis not present

## 2017-11-14 DIAGNOSIS — C44629 Squamous cell carcinoma of skin of left upper limb, including shoulder: Secondary | ICD-10-CM | POA: Diagnosis not present

## 2017-11-14 DIAGNOSIS — C4441 Basal cell carcinoma of skin of scalp and neck: Secondary | ICD-10-CM | POA: Diagnosis not present

## 2017-12-08 DIAGNOSIS — M961 Postlaminectomy syndrome, not elsewhere classified: Secondary | ICD-10-CM | POA: Diagnosis not present

## 2017-12-08 DIAGNOSIS — M5136 Other intervertebral disc degeneration, lumbar region: Secondary | ICD-10-CM | POA: Diagnosis not present

## 2018-01-27 IMAGING — CT CT HIP*R* W/O CM
2 of 4 series · 16 of 46 positions shown, 18 images · non-contrast
Comparison: 04/01/2016 radiographs

CLINICAL DATA: Fall at home 3 days ago. Right hip pain and
difficulty bearing weight.

EXAM:
CT OF THE RIGHT HIP WITHOUT CONTRAST
TECHNIQUE: Multidetector CT imaging of the right hip was performed according to
the standard protocol. Multiplanar CT image reconstructions were
also generated.

[Series 6: hip 2.0 st · axial · 0.37mm/px · z∈[+539,+687]mm · 13 of 86 slices shown, 15 images]
[im 6/86  soft-tissue]
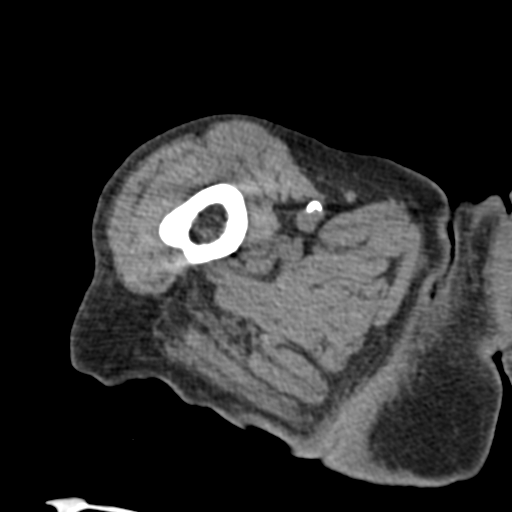
[im 6/86  bone]
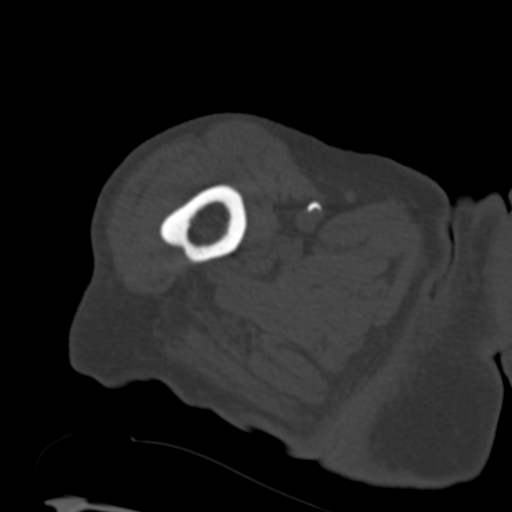
[im 11/86  soft-tissue]
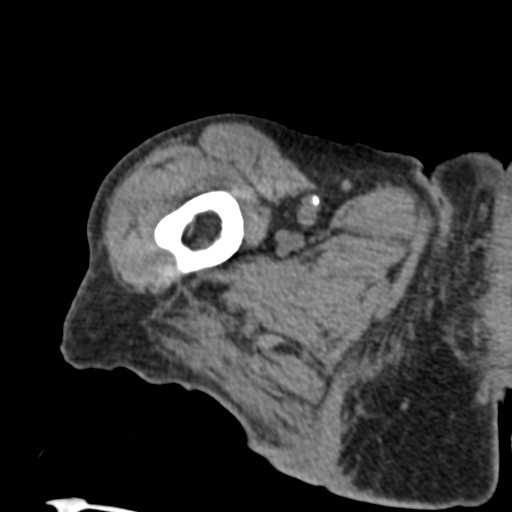
[im 17/86  soft-tissue]
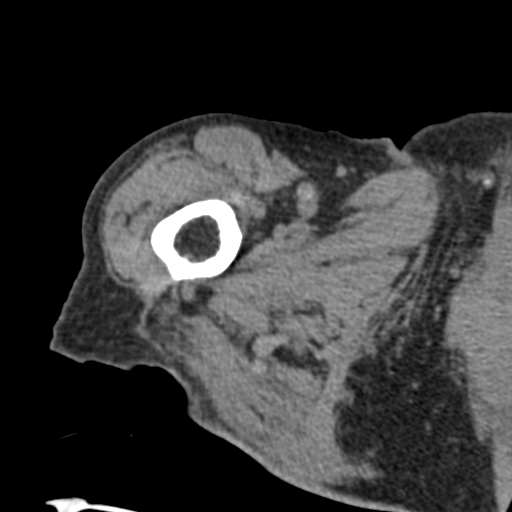
[im 25/86  soft-tissue]
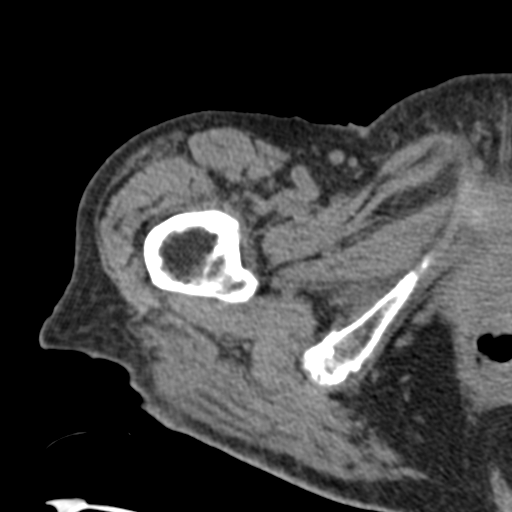
[im 31/86  soft-tissue]
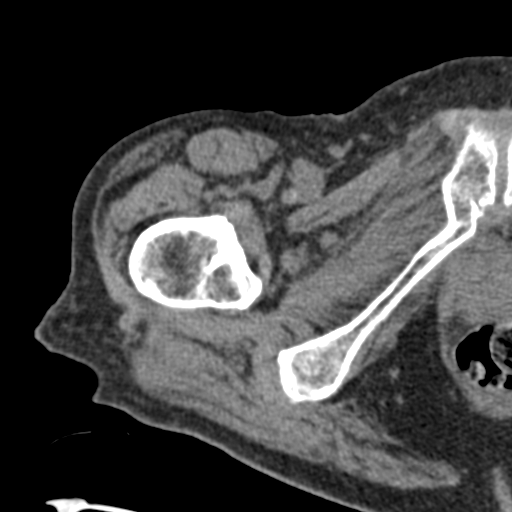
[im 36/86  soft-tissue]
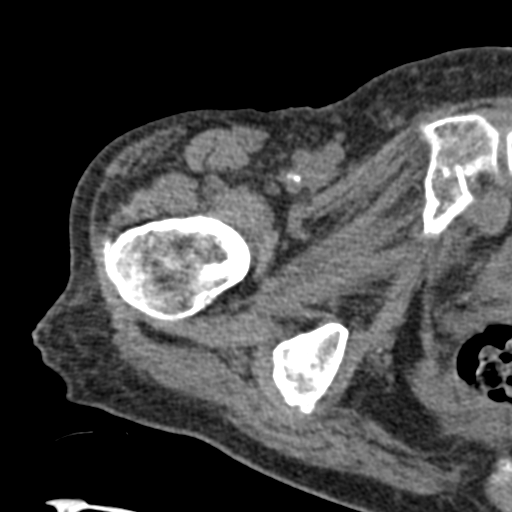
[im 44/86  soft-tissue]
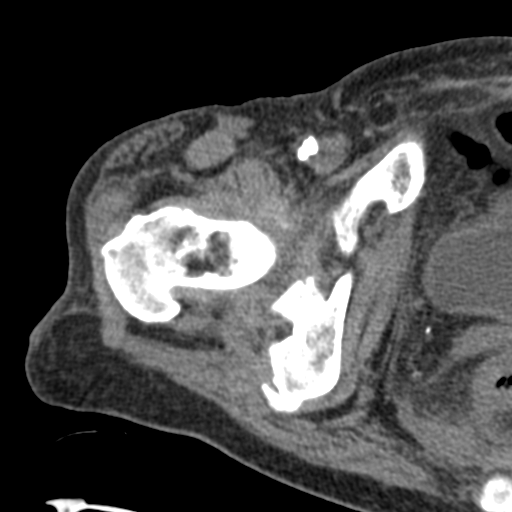
[im 50/86  soft-tissue]
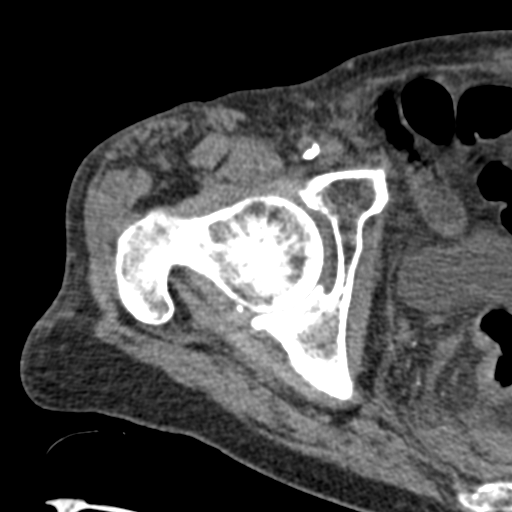
[im 55/86  soft-tissue]
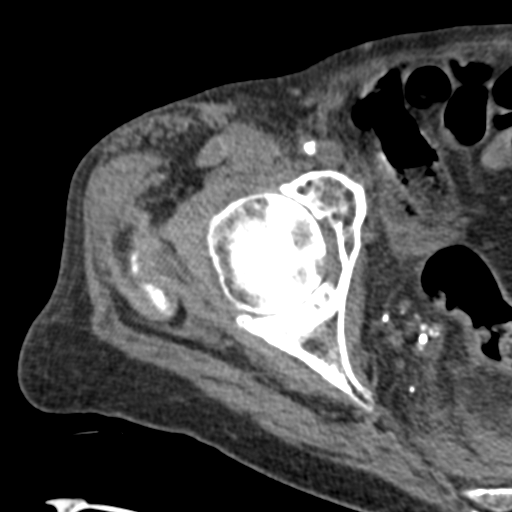
[im 55/86  bone]
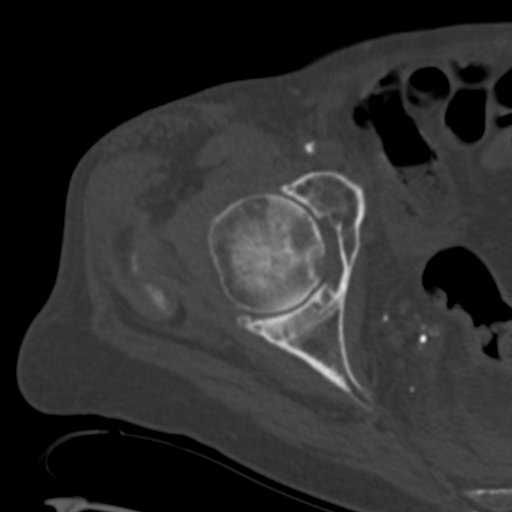
[im 61/86  soft-tissue]
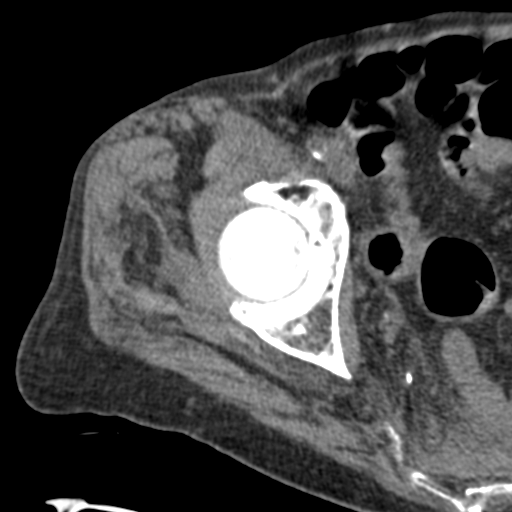
[im 69/86  soft-tissue]
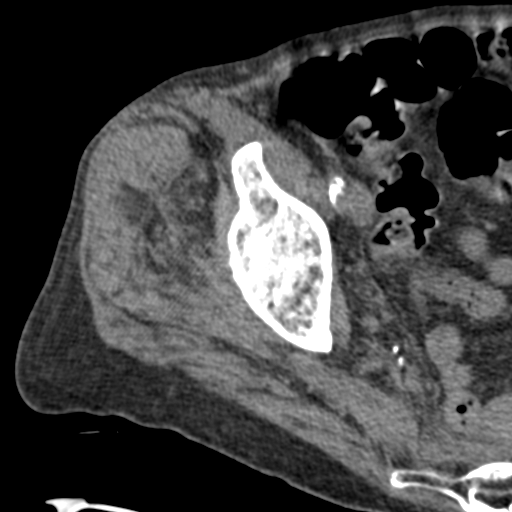
[im 75/86  soft-tissue]
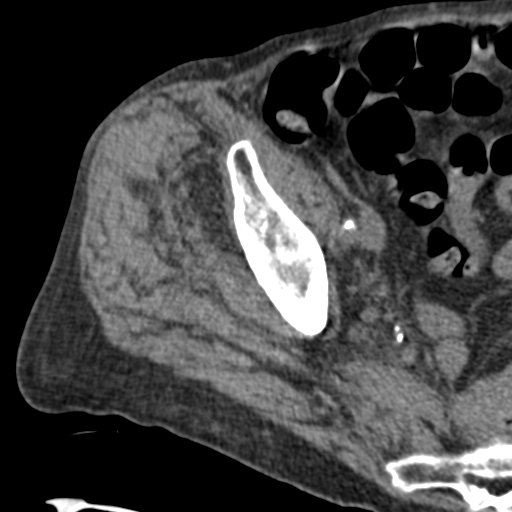
[im 80/86  soft-tissue]
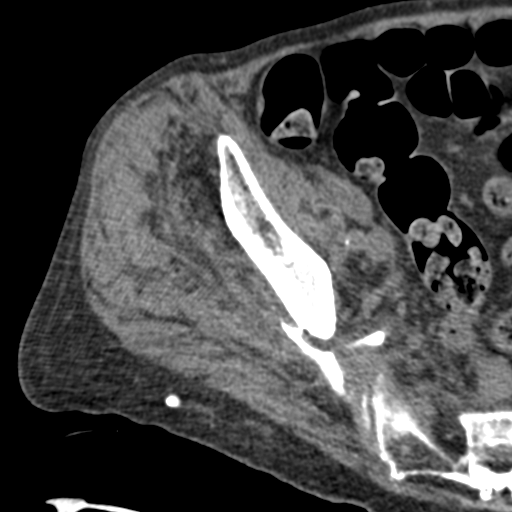

[Series 7: hip 2.0 cor. bone · coronal · 0.33mm/px · 3 of 109 slices shown]
[im 37/109  soft-tissue]
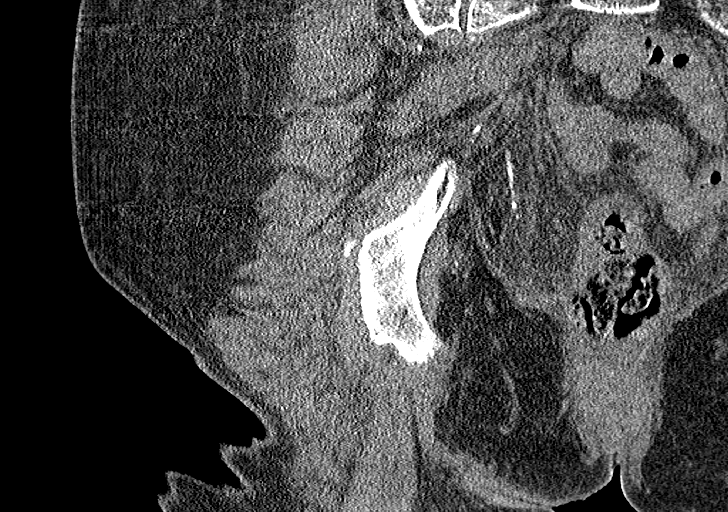
[im 49/109  soft-tissue]
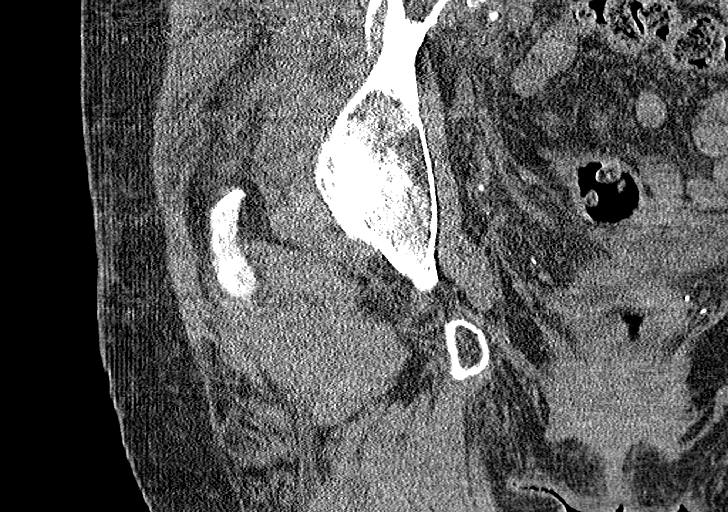
[im 61/109  soft-tissue]
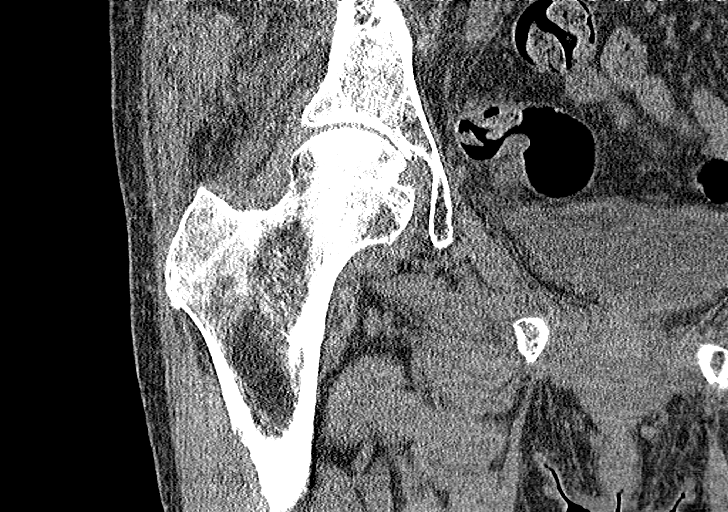

[16 of 46 positions shown; findings below may reference images not displayed]

FINDINGS: Bones/Joint/Cartilage

Vertical fracture in the right iliac bone extending into the
sacroiliac joint, with a fracture of the anterior margin of the
sacrum shown on the top most image number [DATE]. Neither the sacral
nor the iliac bone fracture are completely included on this CT of
the hip which does not include the entire bony pelvis.

The upper portion of the right iliac bone is displaced 8 mm medially
with respect to the lower portion of the right iliac bone.

Mildly comminuted fracture of the inferior pubic ramus extending
into the pubic body and adjacent superior pubic ramus, images
229-280 [DATE].

Ligaments

Suboptimally assessed by CT.

Muscles and Tendons

Unremarkable

Soft tissues

Presacral edema and aches edema extending in the sciatic notch. No
direct bony impingement on the sciatic nerve is seen.
IMPRESSION: 1. Mildly displaced fracture through the right iliac bone extending
into the lower part of the right SI joint, and involving part of the
adjacent sacrum ; with comminuted fractures of the right superior
and inferior pubic ramus extending into the pubic body. The iliac
bone fracture is only partially included on today's exam which was
focused on the hip rather than the entire bony pelvis. Based on the
overall appearance I expect that this is a Young and Suleyma type 2
lateral compression injury. It would probably be prudent to re- scan
the entire bony pelvis to fully characterize the pelvic fractures.

## 2018-01-27 IMAGING — DX DG FOREARM 2V*R*
2 series · 2 of 2 positions shown · non-contrast
Comparison: None.

CLINICAL DATA: 88 y/o F; status post fall 2 days ago with abrasions
on the anterior right forearm just distal to midshaft.

EXAM:
RIGHT FOREARM - 2 VIEW

[forearm ap]
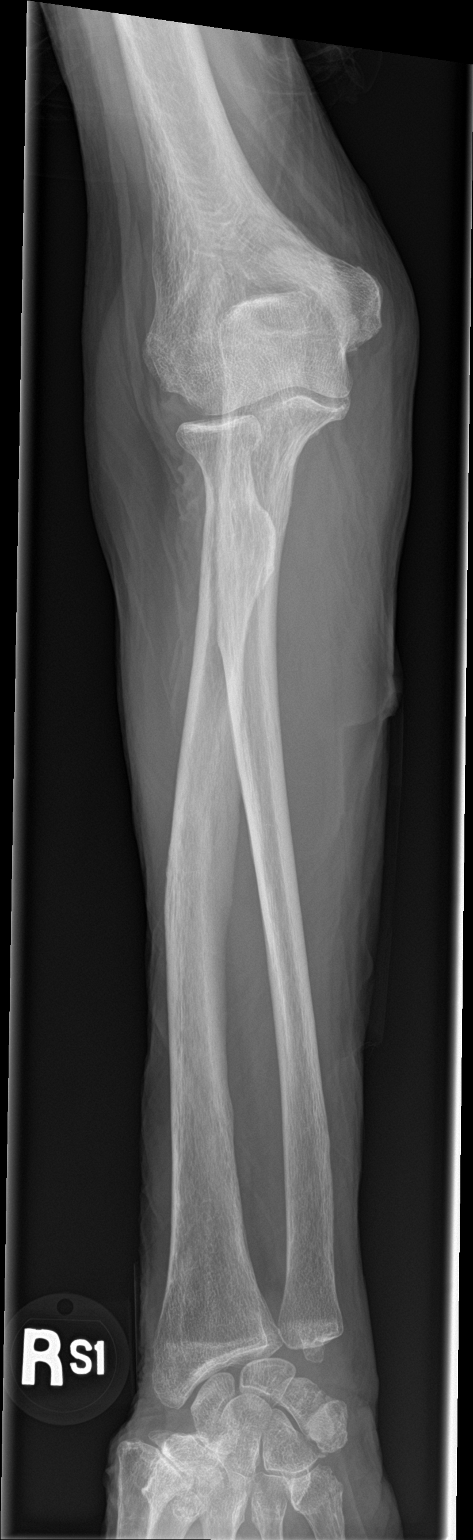

[forearm lat]
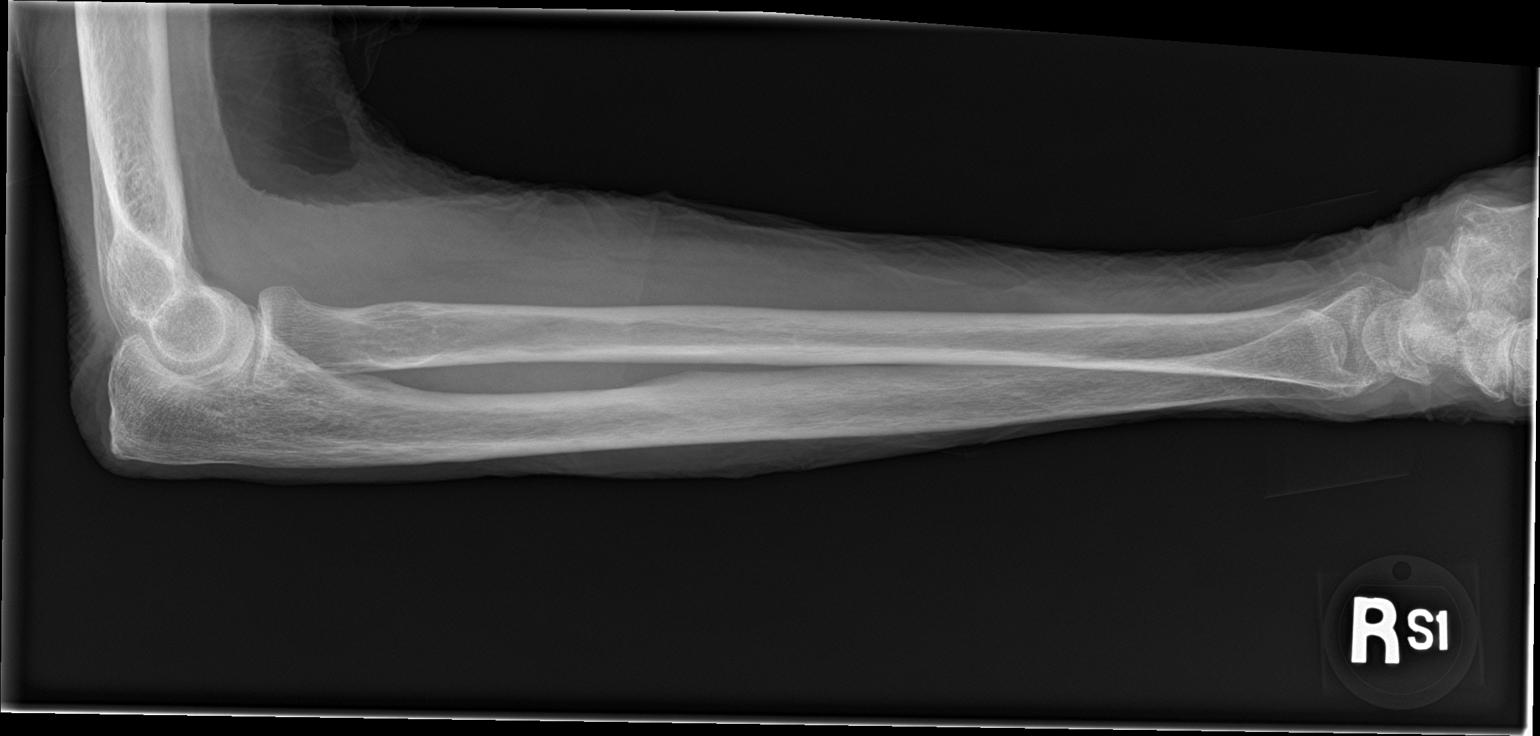

[2 of 2 positions shown; findings below may reference images not displayed]

FINDINGS: There is no evidence of fracture or other focal bone lesions. Soft
tissues are unremarkable.
IMPRESSION: No acute fracture or dislocation identified.

By: Kurt-Olof Mehho M.D.

## 2018-03-22 ENCOUNTER — Ambulatory Visit (HOSPITAL_COMMUNITY): Payer: Medicare HMO

## 2018-03-27 DIAGNOSIS — C44629 Squamous cell carcinoma of skin of left upper limb, including shoulder: Secondary | ICD-10-CM | POA: Diagnosis not present

## 2018-03-27 DIAGNOSIS — D485 Neoplasm of uncertain behavior of skin: Secondary | ICD-10-CM | POA: Diagnosis not present

## 2018-03-27 DIAGNOSIS — C44329 Squamous cell carcinoma of skin of other parts of face: Secondary | ICD-10-CM | POA: Diagnosis not present

## 2018-03-27 DIAGNOSIS — C4441 Basal cell carcinoma of skin of scalp and neck: Secondary | ICD-10-CM | POA: Diagnosis not present

## 2018-03-30 DIAGNOSIS — M1990 Unspecified osteoarthritis, unspecified site: Secondary | ICD-10-CM | POA: Diagnosis not present

## 2018-03-30 DIAGNOSIS — J439 Emphysema, unspecified: Secondary | ICD-10-CM | POA: Diagnosis not present

## 2018-03-30 DIAGNOSIS — R636 Underweight: Secondary | ICD-10-CM | POA: Diagnosis not present

## 2018-03-30 DIAGNOSIS — I2510 Atherosclerotic heart disease of native coronary artery without angina pectoris: Secondary | ICD-10-CM | POA: Diagnosis not present

## 2018-03-30 DIAGNOSIS — I10 Essential (primary) hypertension: Secondary | ICD-10-CM | POA: Diagnosis not present

## 2018-03-30 DIAGNOSIS — G8929 Other chronic pain: Secondary | ICD-10-CM | POA: Diagnosis not present

## 2018-03-30 DIAGNOSIS — I252 Old myocardial infarction: Secondary | ICD-10-CM | POA: Diagnosis not present

## 2018-03-30 DIAGNOSIS — R32 Unspecified urinary incontinence: Secondary | ICD-10-CM | POA: Diagnosis not present

## 2018-03-30 DIAGNOSIS — H04129 Dry eye syndrome of unspecified lacrimal gland: Secondary | ICD-10-CM | POA: Diagnosis not present

## 2018-03-30 DIAGNOSIS — R69 Illness, unspecified: Secondary | ICD-10-CM | POA: Diagnosis not present

## 2018-04-03 ENCOUNTER — Ambulatory Visit (HOSPITAL_COMMUNITY): Payer: Medicare HMO

## 2018-04-10 IMAGING — CR DG CHEST 1V PORT
2 series · 2 of 2 positions shown · non-contrast
Comparison: Chest x-rays dated 06/08/2016, 04/01/2016 and
11/12/2015.

CLINICAL DATA: Fever, decreased p.o. intake.  Pneumonia?

EXAM:
PORTABLE CHEST 1 VIEW

[AP (1 of 2)]
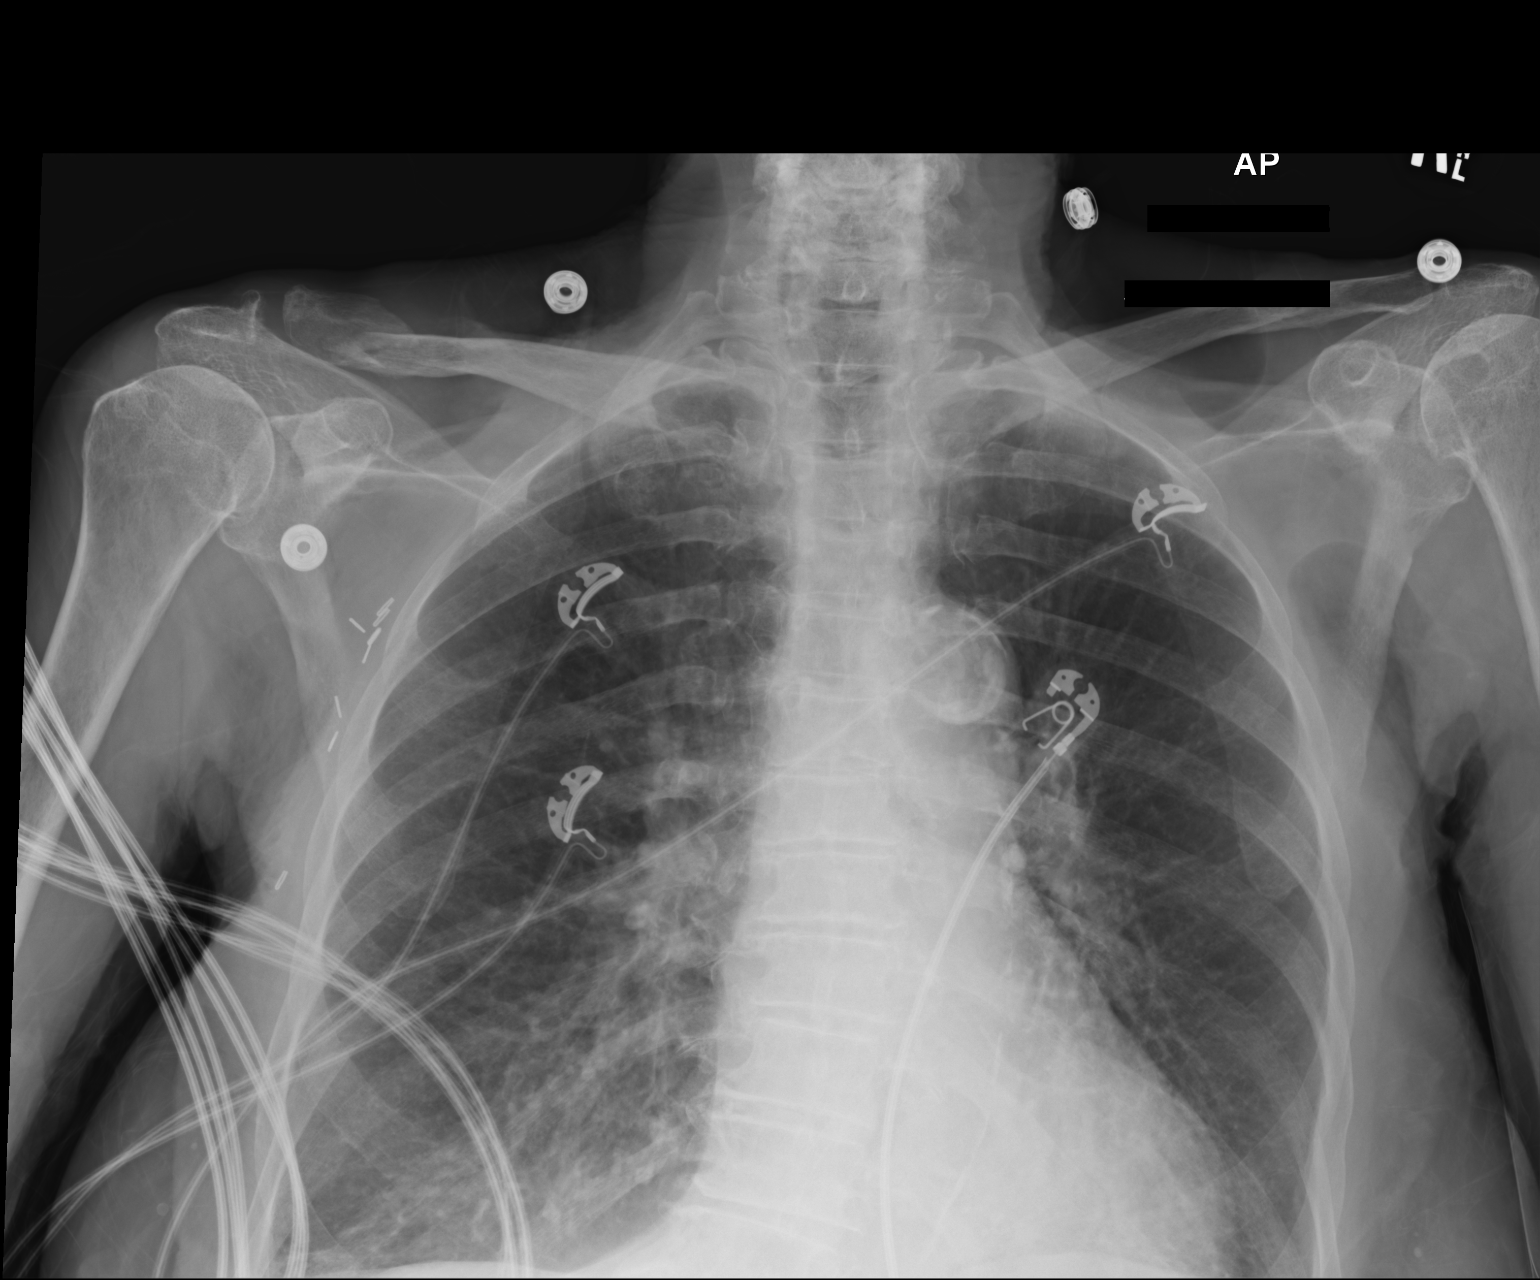

[AP (2 of 2)]
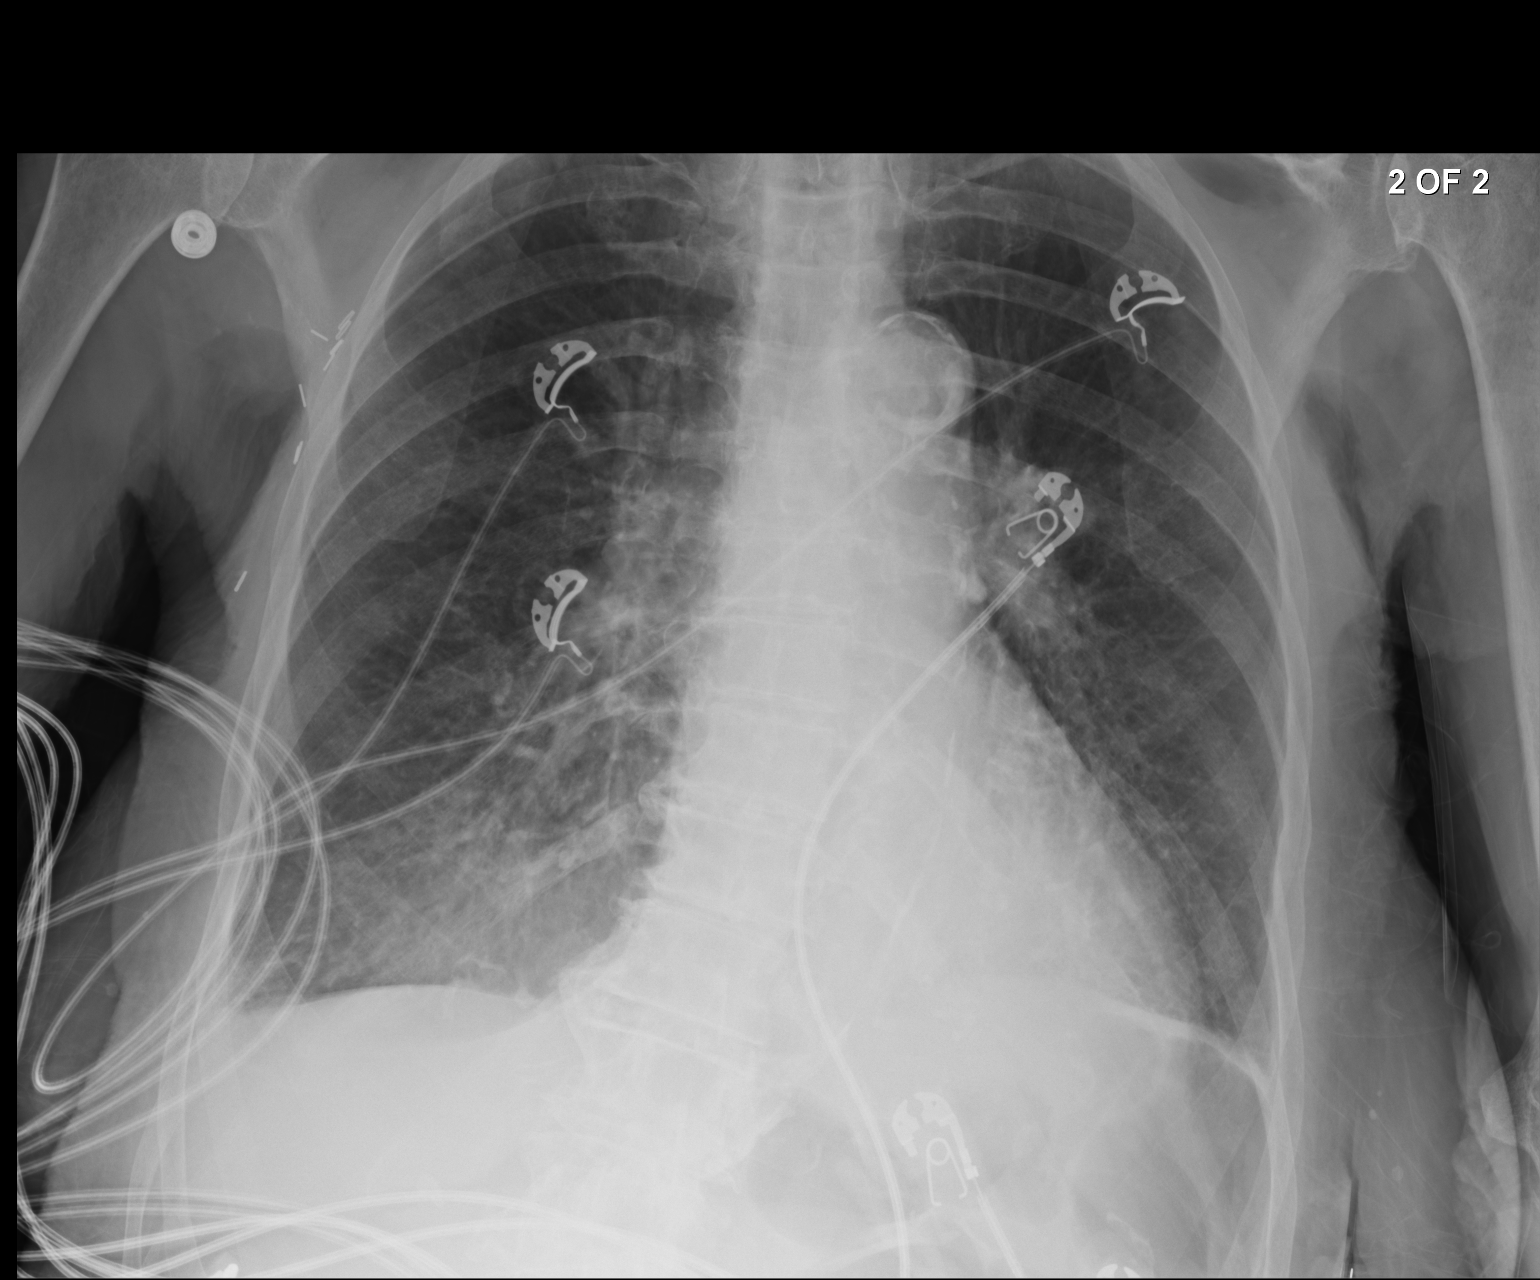

[2 of 2 positions shown; findings below may reference images not displayed]

FINDINGS: New ill-defined airspace opacity at the right lung base, highly
suggestive of pneumonia. Lungs otherwise clear. No pleural effusion
or pneumothorax seen. Heart size is stable. Atherosclerotic changes
noted at the aortic arch.
IMPRESSION: Right lower lobe pneumonia.

Aortic atherosclerosis.

## 2018-04-11 ENCOUNTER — Inpatient Hospital Stay (HOSPITAL_COMMUNITY): Admission: RE | Admit: 2018-04-11 | Payer: Medicare HMO | Source: Ambulatory Visit

## 2018-04-12 ENCOUNTER — Other Ambulatory Visit: Payer: Self-pay | Admitting: Cardiology

## 2018-04-13 ENCOUNTER — Encounter (HOSPITAL_COMMUNITY): Payer: Medicare HMO

## 2018-04-25 ENCOUNTER — Ambulatory Visit (HOSPITAL_COMMUNITY): Payer: Medicare HMO

## 2018-05-05 DIAGNOSIS — L089 Local infection of the skin and subcutaneous tissue, unspecified: Secondary | ICD-10-CM | POA: Diagnosis not present

## 2018-05-05 DIAGNOSIS — C4441 Basal cell carcinoma of skin of scalp and neck: Secondary | ICD-10-CM | POA: Diagnosis not present

## 2018-05-05 DIAGNOSIS — D485 Neoplasm of uncertain behavior of skin: Secondary | ICD-10-CM | POA: Diagnosis not present

## 2018-05-05 DIAGNOSIS — L570 Actinic keratosis: Secondary | ICD-10-CM | POA: Diagnosis not present

## 2018-05-05 DIAGNOSIS — L309 Dermatitis, unspecified: Secondary | ICD-10-CM | POA: Diagnosis not present

## 2018-05-05 DIAGNOSIS — D045 Carcinoma in situ of skin of trunk: Secondary | ICD-10-CM | POA: Diagnosis not present

## 2018-05-12 DIAGNOSIS — L089 Local infection of the skin and subcutaneous tissue, unspecified: Secondary | ICD-10-CM | POA: Diagnosis not present

## 2018-05-29 ENCOUNTER — Encounter (HOSPITAL_COMMUNITY): Payer: Medicare HMO

## 2018-06-23 DIAGNOSIS — M5136 Other intervertebral disc degeneration, lumbar region: Secondary | ICD-10-CM | POA: Diagnosis not present

## 2018-06-23 DIAGNOSIS — M961 Postlaminectomy syndrome, not elsewhere classified: Secondary | ICD-10-CM | POA: Diagnosis not present

## 2018-06-23 DIAGNOSIS — Z79891 Long term (current) use of opiate analgesic: Secondary | ICD-10-CM | POA: Diagnosis not present

## 2018-07-10 ENCOUNTER — Other Ambulatory Visit: Payer: Self-pay | Admitting: Cardiology

## 2018-07-18 ENCOUNTER — Ambulatory Visit (HOSPITAL_COMMUNITY): Payer: Medicare HMO

## 2018-07-24 ENCOUNTER — Telehealth: Payer: Self-pay | Admitting: Cardiology

## 2018-07-24 NOTE — Telephone Encounter (Signed)
home phone/ consent/ my chart declined/ pre reg completed °

## 2018-07-27 NOTE — Progress Notes (Signed)
Virtual Visit via Video Note changed to phone visit at patient request.   This visit type was conducted due to national recommendations for restrictions regarding the COVID-19 Pandemic (e.g. social distancing) in an effort to limit this patient's exposure and mitigate transmission in our community.  Due to her co-morbid illnesses, this patient is at least at moderate risk for complications without adequate follow up.  This format is felt to be most appropriate for this patient at this time.  All issues noted in this document were discussed and addressed.  A limited physical exam was performed with this format.  Please refer to the patient's chart for her consent to telehealth for [facility].   Date:  07/31/2018   ID:  [name] [name], DOB 08-26-1927, MRN [id number]  Patient Location:Home Provider Location: Home  PCP:  Burnis Medin, MD  Cardiologist:  Dr Stanford Breed  Evaluation Performed:  Follow-Up Visit  Chief Complaint:  FU CAD  History of Present Illness:    FU CAD; s/p stenting to RCA in 2005; also with PAD with occluded L SFA by a-gram that was unfavorable for intervention and treated medically, carotid artery disease. Since last seen,  she denies dyspnea, chest pain, palpitations or syncope.  Prior CV studies that were reviewed today include:   Echo 11/16/15 EF 60-65, no RWMA, Gr 1 DD, Ao sclerosis, mild AI, mild MR, severe LAE, mild RAE, mod TR, PASP 43, trivial pericardial effusion  ABIs 5/16 R 0.58; L 0.66 The right ABI is in the moderate to severely decreased range. The left ABI is in the moderately decreased range. Bilateral great toe pressures are abnormal.  Carotid US 2/19 40-59% bilateral stenosis; left subclavian stenotic; follow-up study 1 year  LHC 11/11 LM patent LAD irregularities, D1 ostial 25 LCx mid 25 RCA mid stent patent with 30 ISR EF 65   The patient does not have symptoms concerning for COVID-19 infection (fever, chills, cough,  or new shortness of breath).    Past Medical History:  Diagnosis Date  . CARCINOMA, BASAL CELL 02/13/2006  . CAROTID ARTERY DISEASE 10/03/2006   Carotid US 11/17: Stable 40-59% bilateral ICA stenosis >> f/u 1 year  . CHRONIC OBSTRUCTIVE PULMONARY DISEASE, ACUTE EXACERBATION 11/17/2009  . Chronic rhinitis 12/13/2006  . CORONARY ARTERY DISEASE 02/13/2006  . DEGENERATIVE JOINT DISEASE 09/27/2006  . DERMATITIS 09/25/2007  . Head trauma    closed  . HYPERLIPIDEMIA 03/10/2008  . HYPERTENSION 02/13/2006  . HYPOTHYROIDISM 01/30/2007  . LIVER FUNCTION TESTS, ABNORMAL 01/22/2009  . LOSS, HEARING NOS 08/25/2006  . Near syncope 09/18/2014  . NEOPLASM, SKIN, UNCERTAIN BEHAVIOR 17/51/0258  . NEPHROLITHIASIS, HX OF 02/27/2010  . OSTEOARTHRITIS, HAND 12/16/2008  . PERIPHERAL VASCULAR DISEASE 02/13/2006  . Personal history of malignant neoplasm of breast 07/24/2008  . Personal history of urinary calculi   . Post-traumatic wound infection 12/01/2010   Mild streaking superiorly and on one day of septra  Will give rocephin and add keflex  adn follow up   . POSTHERPETIC NEURALGIA 08/25/2006  . RENAL ARTERY STENOSIS 08/01/2008  . Shingles    recurrent  . SPINAL STENOSIS 11/01/2006  . THROMBOCYTOPENIA 07/30/2008  . UNS ADVRS EFF UNS RX MEDICINAL&BIOLOGICAL SBSTNC 01/30/2007  . UNSPECIFIED ANEMIA 03/18/2008  . Unspecified vitamin D deficiency 02/07/2007  . UNSTABLE ANGINA 12/12/2009  . Vitreous detachment    Past Surgical History:  Procedure Laterality Date  . ABDOMINAL HYSTERECTOMY    . BREAST LUMPECTOMY    . BREAST LUMPECTOMY  1998  .  CATARACT EXTRACTION    . CORONARY ANGIOPLASTY WITH STENT PLACEMENT     bilat iliac stents, left renal artery and rt coronary artery last myoview 8/09 with EF 70%  . LUMBAR LAMINECTOMY    . SKIN BIOPSY  08/24/2017   Invasive squamous cell carcinoma  . SKIN CANCER EXCISION    . TONSILLECTOMY       Current Meds  Medication Sig  . amLODipine (NORVASC) 10 MG tablet TAKE 1  TABLET BY MOUTH DAILY  . aspirin 81 MG tablet Take 81 mg by mouth daily.  . carboxymethylcellulose (REFRESH PLUS) 0.5 % SOLN Place 1 drop into both eyes 4 (four) times daily.  . fluticasone (FLOVENT HFA) 220 MCG/ACT inhaler Inhale 2 puffs into the lungs 2 (two) times daily.  . isosorbide mononitrate (IMDUR) 30 MG 24 hr tablet Take 1 tablet (30 mg total) by mouth daily.  Marland Kitchen levalbuterol (XOPENEX HFA) 45 MCG/ACT inhaler INHALE 2 PUFFS BY MOUTH EVERY 6 HOURS AS NEEDED FOR WHEEZING  . metoprolol succinate (TOPROL-XL) 25 MG 24 hr tablet TAKE 1 TABLET(25 MG) BY MOUTH DAILY  . nitroGLYCERIN (NITROSTAT) 0.4 MG SL tablet Place 1 tablet (0.4 mg total) under the tongue every 5 (five) minutes as needed for chest pain.  . traMADol (ULTRAM) 50 MG tablet daily as needed.  . [DISCONTINUED] Cholecalciferol (VITAMIN D) 1000 UNITS capsule Take 1,000 Units by mouth daily.       Allergies:   Medrol [methylprednisolone], Statins, Oxycodone, Albuterol, Hydrocodone-acetaminophen, and Tequin   Social History   Tobacco Use  . Smoking status: Former Smoker    Packs/day: 1.00    Years: 30.00    Pack years: 30.00    Types: Cigarettes    Quit date: 06/15/2010    Years since quitting: 8.1  . Smokeless tobacco: Never Used  . Tobacco comment: 2-3 cigs per week/// 06/30/12  Substance Use Topics  . Alcohol use: No    Comment: socially  . Drug use: No     Family Hx: The patient's family history includes Breast cancer in her daughter; Tuberculosis in her father.  ROS:   Please see the history of present illness.    No Fever, chills  or productive cough; she does have pain in her right hip and leg with ambulation.  Unchanged compared to previous. All other systems reviewed and are negative.   Recent Lipid Panel Lab Results  Component Value Date/Time   CHOL 160 08/02/2014 09:51 AM   TRIG 57.0 08/02/2014 09:51 AM   TRIG 83 12/14/2005 10:11 AM   HDL 64.70 08/02/2014 09:51 AM   CHOLHDL 2 08/02/2014 09:51 AM    LDLCALC 84 08/02/2014 09:51 AM    Wt Readings from Last 3 Encounters:  07/31/18 105 lb (47.6 kg)  08/11/17 102 lb 9.6 oz (46.5 kg)  04/18/17 102 lb 9.6 oz (46.5 kg)     Objective:    Vital Signs:  Ht 5\' 4"  (1.626 m)   Wt 105 lb (47.6 kg)   BMI 18.02 kg/m    VITAL SIGNS:  reviewed NAD Answers questions appropriately Normal affect Remainder of physical examination not performed (telehealth visit; coronavirus pandemic)  ASSESSMENT & PLAN:    1. Coronary artery disease-plan to continue aspirin.  She is intolerant to statins. 2. Carotid artery disease-schedule follow-up carotid Dopplers. 3. hypertension-Continue present medications and follow. 4. Hyperlipidemia-she is intolerant to statins.  We discussed other lipid lowering medications and she would prefer diet. 5. Tobacco abuse-patient counseled on discontinuing. 6. History of renal  artery stenosis-continue aspirin. 7. Peripheral vascular disease-continue aspirin and statin.  No recent claudication. 8. Chronic diastolic congestive heart failure-patient is euvolemic based on history.  Continue fluid restriction and low-sodium diet.  COVID-19 Education: The importance of social distancing was discussed today.  Time:   Today, I have spent 18 minutes with the patient with telehealth technology discussing the above problems.     Medication Adjustments/Labs and Tests Ordered: Current medicines are reviewed at length with the patient today.  Concerns regarding medicines are outlined above.   Tests Ordered: No orders of the defined types were placed in this encounter.   Medication Changes: No orders of the defined types were placed in this encounter.   Follow Up:  Virtual Visit or In Person in 1 year(s)  Signed, Kirk Ruths, MD  07/31/2018 7:56 AM    [facility]

## 2018-07-28 NOTE — Telephone Encounter (Signed)
I called pt to confirm her appt on 07-31-18 with Dr Stanford Breed.

## 2018-07-31 ENCOUNTER — Encounter: Payer: Self-pay | Admitting: Cardiology

## 2018-07-31 ENCOUNTER — Telehealth (INDEPENDENT_AMBULATORY_CARE_PROVIDER_SITE_OTHER): Payer: Medicare HMO | Admitting: Cardiology

## 2018-07-31 VITALS — Ht 64.0 in | Wt 105.0 lb

## 2018-07-31 DIAGNOSIS — E7800 Pure hypercholesterolemia, unspecified: Secondary | ICD-10-CM

## 2018-07-31 DIAGNOSIS — I6523 Occlusion and stenosis of bilateral carotid arteries: Secondary | ICD-10-CM

## 2018-07-31 DIAGNOSIS — I739 Peripheral vascular disease, unspecified: Secondary | ICD-10-CM

## 2018-07-31 DIAGNOSIS — I2510 Atherosclerotic heart disease of native coronary artery without angina pectoris: Secondary | ICD-10-CM

## 2018-07-31 DIAGNOSIS — I10 Essential (primary) hypertension: Secondary | ICD-10-CM

## 2018-07-31 DIAGNOSIS — Z720 Tobacco use: Secondary | ICD-10-CM

## 2018-07-31 NOTE — Patient Instructions (Signed)
Medication Instructions:  NO CHANGE If you need a refill on your cardiac medications before your next appointment, please call your pharmacy.   Lab work: If you have labs (blood work) drawn today and your tests are completely normal, you will receive your results only by: Marland Kitchen MyChart Message (if you have MyChart) OR . A paper copy in the mail If you have any lab test that is abnormal or we need to change your treatment, we will call you to review the results.  Testing/Procedures: Your physician has requested that you have a carotid duplex. This test is an ultrasound of the carotid arteries in your neck. It looks at blood flow through these arteries that supply the brain with blood. Allow one hour for this exam. There are no restrictions or special instructions.    Follow-Up: At [facility], you and your health needs are our priority.  As part of our continuing mission to provide you with exceptional heart care, we have created designated Provider Care Teams.  These Care Teams include your primary Cardiologist Kirk Ruths MD and Advanced Practice Providers (APPs -  Physician Assistants and Nurse Practitioners) who all work together to provide you with the care you need, when you need it. You will need a follow up appointment in 12 months.  Please call our office 2 months in advance to schedule this appointment.  You may see Kirk Ruths MD  or one of the following Advanced Practice Providers on your designated Care Team:   Kerin Ransom, PA-C Roby Lofts, [place] . Sande Rives, PA-C

## 2018-08-08 ENCOUNTER — Ambulatory Visit (HOSPITAL_COMMUNITY)
Admission: RE | Admit: 2018-08-08 | Payer: Medicare HMO | Source: Ambulatory Visit | Attending: Cardiology | Admitting: Cardiology

## 2018-08-17 ENCOUNTER — Encounter (HOSPITAL_COMMUNITY): Payer: Medicare HMO

## 2018-08-25 ENCOUNTER — Ambulatory Visit (HOSPITAL_COMMUNITY): Payer: Medicare HMO

## 2018-10-03 ENCOUNTER — Other Ambulatory Visit: Payer: Self-pay | Admitting: Physician Assistant

## 2018-10-03 ENCOUNTER — Other Ambulatory Visit: Payer: Self-pay | Admitting: Cardiology

## 2018-10-09 ENCOUNTER — Other Ambulatory Visit: Payer: Self-pay | Admitting: Cardiology

## 2018-11-06 ENCOUNTER — Ambulatory Visit (HOSPITAL_COMMUNITY): Payer: Medicare HMO

## 2018-11-20 ENCOUNTER — Encounter (HOSPITAL_COMMUNITY): Payer: Medicare HMO

## 2018-11-23 ENCOUNTER — Other Ambulatory Visit: Payer: Self-pay

## 2018-11-23 ENCOUNTER — Emergency Department (HOSPITAL_COMMUNITY)
Admission: EM | Admit: 2018-11-23 | Discharge: 2018-11-24 | Disposition: A | Discharge status: Home / Self Care | Payer: Medicare HMO | Attending: Emergency Medicine | Admitting: Emergency Medicine

## 2018-11-23 ENCOUNTER — Encounter (HOSPITAL_COMMUNITY): Payer: Self-pay | Admitting: Emergency Medicine

## 2018-11-23 ENCOUNTER — Emergency Department (HOSPITAL_COMMUNITY): Payer: Medicare HMO

## 2018-11-23 DIAGNOSIS — R1084 Generalized abdominal pain: Secondary | ICD-10-CM | POA: Diagnosis not present

## 2018-11-23 DIAGNOSIS — K828 Other specified diseases of gallbladder: Secondary | ICD-10-CM | POA: Diagnosis not present

## 2018-11-23 DIAGNOSIS — J9809 Other diseases of bronchus, not elsewhere classified: Secondary | ICD-10-CM | POA: Diagnosis not present

## 2018-11-23 DIAGNOSIS — R1011 Right upper quadrant pain: Secondary | ICD-10-CM

## 2018-11-23 DIAGNOSIS — I10 Essential (primary) hypertension: Secondary | ICD-10-CM | POA: Insufficient documentation

## 2018-11-23 DIAGNOSIS — E039 Hypothyroidism, unspecified: Secondary | ICD-10-CM | POA: Diagnosis not present

## 2018-11-23 DIAGNOSIS — Z20828 Contact with and (suspected) exposure to other viral communicable diseases: Secondary | ICD-10-CM | POA: Diagnosis not present

## 2018-11-23 DIAGNOSIS — F17210 Nicotine dependence, cigarettes, uncomplicated: Secondary | ICD-10-CM | POA: Insufficient documentation

## 2018-11-23 DIAGNOSIS — Z03818 Encounter for observation for suspected exposure to other biological agents ruled out: Secondary | ICD-10-CM | POA: Diagnosis not present

## 2018-11-23 DIAGNOSIS — I2510 Atherosclerotic heart disease of native coronary artery without angina pectoris: Secondary | ICD-10-CM | POA: Diagnosis not present

## 2018-11-23 DIAGNOSIS — J984 Other disorders of lung: Secondary | ICD-10-CM | POA: Diagnosis not present

## 2018-11-23 DIAGNOSIS — Z7982 Long term (current) use of aspirin: Secondary | ICD-10-CM | POA: Diagnosis not present

## 2018-11-23 DIAGNOSIS — R0902 Hypoxemia: Secondary | ICD-10-CM | POA: Diagnosis not present

## 2018-11-23 DIAGNOSIS — R9389 Abnormal findings on diagnostic imaging of other specified body structures: Secondary | ICD-10-CM | POA: Diagnosis not present

## 2018-11-23 DIAGNOSIS — R110 Nausea: Secondary | ICD-10-CM | POA: Diagnosis not present

## 2018-11-23 DIAGNOSIS — R945 Abnormal results of liver function studies: Secondary | ICD-10-CM | POA: Diagnosis not present

## 2018-11-23 DIAGNOSIS — R69 Illness, unspecified: Secondary | ICD-10-CM | POA: Diagnosis not present

## 2018-11-23 DIAGNOSIS — Z79899 Other long term (current) drug therapy: Secondary | ICD-10-CM | POA: Diagnosis not present

## 2018-11-23 DIAGNOSIS — I491 Atrial premature depolarization: Secondary | ICD-10-CM | POA: Diagnosis not present

## 2018-11-23 DIAGNOSIS — R52 Pain, unspecified: Secondary | ICD-10-CM | POA: Diagnosis not present

## 2018-11-23 DIAGNOSIS — I959 Hypotension, unspecified: Secondary | ICD-10-CM | POA: Diagnosis not present

## 2018-11-23 DIAGNOSIS — K838 Other specified diseases of biliary tract: Secondary | ICD-10-CM | POA: Diagnosis not present

## 2018-11-23 DIAGNOSIS — J449 Chronic obstructive pulmonary disease, unspecified: Secondary | ICD-10-CM | POA: Diagnosis not present

## 2018-11-23 DIAGNOSIS — R7989 Other specified abnormal findings of blood chemistry: Secondary | ICD-10-CM

## 2018-11-23 LAB — CBC WITH DIFFERENTIAL/PLATELET
Abs Immature Granulocytes: 0.02 10*3/uL (ref 0.00–0.07)
Basophils Absolute: 0 10*3/uL (ref 0.0–0.1)
Basophils Relative: 0 %
Eosinophils Absolute: 0.2 10*3/uL (ref 0.0–0.5)
Eosinophils Relative: 2 %
HCT: 42.1 % (ref 36.0–46.0)
Hemoglobin: 13.6 g/dL (ref 12.0–15.0)
Immature Granulocytes: 0 %
Lymphocytes Relative: 9 %
Lymphs Abs: 0.7 10*3/uL (ref 0.7–4.0)
MCH: 30.4 pg (ref 26.0–34.0)
MCHC: 32.3 g/dL (ref 30.0–36.0)
MCV: 94.2 fL (ref 80.0–100.0)
Monocytes Absolute: 0.6 10*3/uL (ref 0.1–1.0)
Monocytes Relative: 8 %
Neutro Abs: 6.4 10*3/uL (ref 1.7–7.7)
Neutrophils Relative %: 81 %
Platelets: 174 10*3/uL (ref 150–400)
RBC: 4.47 MIL/uL (ref 3.87–5.11)
RDW: 13.6 % (ref 11.5–15.5)
WBC: 7.9 10*3/uL (ref 4.0–10.5)
nRBC: 0 % (ref 0.0–0.2)

## 2018-11-23 LAB — COMPREHENSIVE METABOLIC PANEL
ALT: 26 U/L (ref 0–44)
AST: 67 U/L — ABNORMAL HIGH (ref 15–41)
Albumin: 3.2 g/dL — ABNORMAL LOW (ref 3.5–5.0)
Alkaline Phosphatase: 84 U/L (ref 38–126)
Anion gap: 10 (ref 5–15)
BUN: 28 mg/dL — ABNORMAL HIGH (ref 8–23)
CO2: 24 mmol/L (ref 22–32)
Calcium: 9.4 mg/dL (ref 8.9–10.3)
Chloride: 107 mmol/L (ref 98–111)
Creatinine, Ser: 1.16 mg/dL — ABNORMAL HIGH (ref 0.44–1.00)
GFR calc Af Amer: 49 mL/min — ABNORMAL LOW (ref >60)
GFR calc non Af Amer: 42 mL/min — ABNORMAL LOW (ref >60)
Glucose, Bld: 108 mg/dL — ABNORMAL HIGH (ref 70–99)
Potassium: 3.5 mmol/L (ref 3.5–5.1)
Sodium: 141 mmol/L (ref 135–145)
Total Bilirubin: 1.7 mg/dL — ABNORMAL HIGH (ref 0.3–1.2)
Total Protein: 5.8 g/dL — ABNORMAL LOW (ref 6.5–8.1)

## 2018-11-23 LAB — URINALYSIS, ROUTINE W REFLEX MICROSCOPIC
Bacteria, UA: NONE SEEN
Bilirubin Urine: NEGATIVE
Glucose, UA: NEGATIVE mg/dL
Ketones, ur: 5 mg/dL — AB
Leukocytes,Ua: NEGATIVE
Nitrite: NEGATIVE
Protein, ur: NEGATIVE mg/dL
Specific Gravity, Urine: 1.018 (ref 1.005–1.030)
pH: 5 (ref 5.0–8.0)

## 2018-11-23 LAB — LIPASE, BLOOD: Lipase: 38 U/L (ref 11–51)

## 2018-11-23 LAB — LACTIC ACID, PLASMA: Lactic Acid, Venous: 1.2 mmol/L (ref 0.5–1.9)

## 2018-11-23 MED ORDER — MORPHINE SULFATE (PF) 2 MG/ML IV SOLN
2.0000 mg | Freq: Once | INTRAVENOUS | Status: AC
  Administered 2018-11-24: 2 mg via INTRAVENOUS
  Filled 2018-11-23: qty 1

## 2018-11-23 MED ORDER — ONDANSETRON HCL 4 MG/2ML IJ SOLN
4.0000 mg | Freq: Once | INTRAMUSCULAR | Status: AC
  Administered 2018-11-23: 23:00:00 4 mg via INTRAVENOUS
  Filled 2018-11-23: qty 2

## 2018-11-23 MED ORDER — LEVALBUTEROL TARTRATE 45 MCG/ACT IN AERO
4.0000 | INHALATION_SPRAY | Freq: Once | RESPIRATORY_TRACT | Status: AC
  Administered 2018-11-24: 4 via RESPIRATORY_TRACT
  Filled 2018-11-23: qty 15

## 2018-11-23 MED ORDER — FENTANYL CITRATE (PF) 100 MCG/2ML IJ SOLN
25.0000 ug | Freq: Once | INTRAMUSCULAR | Status: AC
  Administered 2018-11-23: 25 ug via INTRAVENOUS
  Filled 2018-11-23: qty 2

## 2018-11-23 NOTE — ED Provider Notes (Signed)
[place] EMERGENCY DEPARTMENT Provider Note   CSN: [id number] Arrival date & time: 11/23/18  2141     History   Chief Complaint Chief Complaint  Patient presents with   Abdominal Pain    HPI [name] [name] is a 85 y.o. female.     Patient with h/o HTN, HLD, COPD, CAD, PVD presents with bilateral upper abdominal pain since late this afternoon. The pain is constant and nonradiating. She is unable to identify any modifying factors and has not tried any remedies at home for symptoms. No history of similar pain in the past. She denies chest pain, denies change in her breathing from baseline COPD, denies fever. She reports later onset nausea without vomiting. No diarrhea, urinary symptoms.   The history is provided by the patient. No language interpreter was used.  Abdominal Pain Associated symptoms: nausea   Associated symptoms: no chest pain, no chills, no dysuria and no fever     Past Medical History:  Diagnosis Date   CARCINOMA, BASAL CELL 02/13/2006   CAROTID ARTERY DISEASE 10/03/2006   Carotid US 11/17: Stable 40-59% bilateral ICA stenosis >> f/u 1 year   CHRONIC OBSTRUCTIVE PULMONARY DISEASE, ACUTE EXACERBATION 11/17/2009   Chronic rhinitis 12/13/2006   CORONARY ARTERY DISEASE 02/13/2006   DEGENERATIVE JOINT DISEASE 09/27/2006   DERMATITIS 09/25/2007   Head trauma    closed   HYPERLIPIDEMIA 03/10/2008   HYPERTENSION 02/13/2006   HYPOTHYROIDISM 01/30/2007   LIVER FUNCTION TESTS, ABNORMAL 01/22/2009   LOSS, HEARING NOS 08/25/2006   Near syncope 09/18/2014   NEOPLASM, SKIN, UNCERTAIN BEHAVIOR [id number]   NEPHROLITHIASIS, HX OF 02/27/2010   OSTEOARTHRITIS, HAND 12/16/2008   PERIPHERAL VASCULAR DISEASE 02/13/2006   Personal history of malignant neoplasm of breast 07/24/2008   Personal history of urinary calculi    Post-traumatic wound infection 12/01/2010   Mild streaking superiorly and on one day of septra  Will give rocephin  and add keflex  adn follow up    POSTHERPETIC NEURALGIA 08/25/2006   RENAL ARTERY STENOSIS 08/01/2008   Shingles    recurrent   SPINAL STENOSIS 11/01/2006   THROMBOCYTOPENIA 07/30/2008   UNS ADVRS EFF UNS RX MEDICINAL&BIOLOGICAL SBSTNC 01/30/2007   UNSPECIFIED ANEMIA 03/18/2008   Unspecified vitamin D deficiency 02/07/2007   UNSTABLE ANGINA 12/12/2009   Vitreous detachment     Patient Active Problem List   Diagnosis Date Noted   Lobar pneumonia ([place]) 06/14/2016   Respiratory failure with hypoxia ([place]) 06/13/2016   Multiple closed pelvic fractures without disruption of pelvic circle ([place]) 04/03/2016   Multiple pelvic fractures ([place]) 04/01/2016   Fall 04/01/2016   Pneumonia 12/19/2015   Advanced age 71/26/2017   Protein-calorie malnutrition ([place]) 11/27/2015   Frailty 11/27/2015   Protein-calorie malnutrition, severe 11/15/2015   COPD exacerbation ([place]) 11/12/2015   CAD (coronary artery disease), native coronary artery 11/12/2015   Statin intolerance 10/11/2014   Headache disorder 09/18/2014   Laceration of right lower leg with complication [id number]   Post-traumatic wound infection 08/27/2013   Rash and nonspecific skin eruption 11/09/2012   Pain of right breast 08/15/2012   Urinary frequency 08/15/2012   HX: breast cancer 08/15/2012   TOBACCO USE 04/21/2012   Arthritis 05/31/2011   Pain of left heel 05/31/2011   Infected cyst of skin 12/29/2010   Leg cramps 12/12/2010   SOB (shortness of breath) 11/26/2010   Glossitis 11/26/2010   Thrombocytopenia ([place]) 07/21/2010   Traumatic ecchymosis of lower leg 07/21/2010   Atelectasis 07/16/2010   Tinnitus  06/04/2010   NEPHROLITHIASIS, HX OF 02/27/2010   HIP PAIN 02/26/2010   FEMORAL BRUIT, RIGHT 01/02/2010   UNSTABLE ANGINA 12/12/2009   Essential hypertension, benign 06/03/2009   LIVER FUNCTION TESTS, ABNORMAL 01/22/2009   NEOPLASM, SKIN, UNCERTAIN BEHAVIOR [id number]    OSTEOARTHRITIS, HAND 12/16/2008   UNSPECIFIED DISORDER OF KIDNEY AND URETER 09/17/2008   RENAL ARTERY STENOSIS 08/01/2008   THROMBOCYTOPENIA 07/30/2008   PERSONAL HISTORY OF MALIGNANT NEOPLASM OF BREAST 07/24/2008   WEIGHT LOSS 06/11/2008   GROIN PAIN 06/11/2008   INSOMNIA 04/05/2008   UNSPECIFIED ANEMIA 03/18/2008   HYPERLIPIDEMIA 03/10/2008   UNDERWEIGHT 01/17/2008   DERMATITIS 09/25/2007   UNSPECIFIED VITAMIN D DEFICIENCY 02/07/2007   HYPOTHYROIDISM 01/30/2007   CHRONIC RHINITIS 12/13/2006   COPD (chronic obstructive pulmonary disease) ([place]) 11/21/2006   SPINAL STENOSIS 11/01/2006   Carotid arterial disease ([place]) 10/03/2006   DEGENERATIVE JOINT DISEASE 09/27/2006   LOSS, HEARING NOS 08/25/2006   TMJ PAIN 08/25/2006   CARCINOMA, BASAL CELL 02/13/2006   Coronary atherosclerosis 02/13/2006   Peripheral vascular disease ([place]) 02/13/2006   HEMATURIA, MICROSCOPIC, HX OF 02/13/2006    Past Surgical History:  Procedure Laterality Date   ABDOMINAL HYSTERECTOMY     BREAST LUMPECTOMY     BREAST LUMPECTOMY  1998   CATARACT EXTRACTION     CORONARY ANGIOPLASTY WITH STENT PLACEMENT     bilat iliac stents, left renal artery and rt coronary artery last myoview 8/09 with EF 70%   LUMBAR LAMINECTOMY     SKIN BIOPSY  08/24/2017   Invasive squamous cell carcinoma   SKIN CANCER EXCISION     TONSILLECTOMY       OB History   No obstetric history on file.      Home Medications    Prior to Admission medications   Medication Sig Start Date End Date Taking? Authorizing Provider  amLODipine (NORVASC) 10 MG tablet TAKE 1 TABLET BY MOUTH DAILY 10/11/18   Lelon Perla, MD  aspirin 81 MG tablet Take 81 mg by mouth daily.    [provider]  carboxymethylcellulose (REFRESH PLUS) 0.5 % SOLN Place 1 drop into both eyes 4 (four) times daily.    [provider]  fluticasone (FLOVENT HFA) 220 MCG/ACT inhaler Inhale 2 puffs into the lungs 2  (two) times daily. 02/05/17 07/31/18  Dana Allan I, MD  isosorbide mononitrate (IMDUR) 30 MG 24 hr tablet TAKE 1 TABLET BY MOUTH DAILY 10/03/18   Almyra Deforest, PA  levalbuterol [facility] HFA) 45 MCG/ACT inhaler INHALE 2 PUFFS BY MOUTH EVERY 6 HOURS AS NEEDED FOR WHEEZING 10/15/15   Rigoberto Noel, MD  metoprolol succinate (TOPROL-XL) 25 MG 24 hr tablet TAKE 1 TABLET(25 MG) BY MOUTH DAILY 10/03/18   Lelon Perla, MD  nitroGLYCERIN (NITROSTAT) 0.4 MG SL tablet Place 1 tablet (0.4 mg total) under the tongue every 5 (five) minutes as needed for chest pain. 06/23/16 07/31/18  Lelon Perla, MD  traMADol (ULTRAM) 50 MG tablet daily as needed. 11/18/16   [provider]    Family History Family History  Problem Relation Age of Onset   Breast cancer Daughter    Tuberculosis Father     Social History Social History   Tobacco Use   Smoking status: Current Every Day Smoker    Packs/day: 1.00    Years: 30.00    Pack years: 30.00    Types: Cigarettes    Last attempt to quit: 06/15/2010    Years since quitting: 8.4   Smokeless  tobacco: Never Used  Substance Use Topics   Alcohol use: No    Comment: socially   Drug use: No     Allergies   Medrol [methylprednisolone], Statins, Oxycodone, Albuterol, Hydrocodone-acetaminophen, and Tequin   Review of Systems Review of Systems  Constitutional: Negative for chills and fever.  HENT: Negative.   Respiratory: Negative.   Cardiovascular: Negative.  Negative for chest pain.  Gastrointestinal: Positive for abdominal pain and nausea.  Genitourinary: Negative for dysuria.  Musculoskeletal: Negative.   Skin: Negative.   Neurological: Negative.  Negative for weakness and headaches.     Physical Exam Updated Vital Signs BP (!) 171/70    Pulse (!) 55    Temp 97.6 F (36.4 C) (Oral)    Resp 12    SpO2 100%   Physical Exam Vitals signs and nursing note reviewed.  Constitutional:      Appearance: She is well-developed.      Comments: Uncomfortable appearing.  HENT:     Head: Normocephalic.  Neck:     Musculoskeletal: Normal range of motion and neck supple.  Cardiovascular:     Rate and Rhythm: Normal rate and regular rhythm.  Pulmonary:     Effort: Pulmonary effort is normal.     Breath sounds: Rales present. No wheezing or rhonchi.  Abdominal:     General: Bowel sounds are normal.     Palpations: Abdomen is soft.     Tenderness: There is abdominal tenderness in the right upper quadrant and left upper quadrant. There is guarding. There is no rebound.  Musculoskeletal: Normal range of motion.  Skin:    General: Skin is warm and dry.     Findings: No rash.  Neurological:     Mental Status: She is alert and oriented to person, place, and time.      ED Treatments / Results  Labs (all labs ordered are listed, but only abnormal results are displayed) Labs Reviewed  URINE CULTURE  CBC WITH DIFFERENTIAL/PLATELET  LIPASE, BLOOD  LACTIC ACID, PLASMA  LACTIC ACID, PLASMA  COMPREHENSIVE METABOLIC PANEL  URINALYSIS, ROUTINE W REFLEX MICROSCOPIC    EKG None  Radiology No results found.  Procedures Procedures (including critical care time)  Medications Ordered in ED Medications  fentaNYL (SUBLIMAZE) injection 25 mcg (has no administration in time range)  ondansetron (ZOFRAN) injection 4 mg (has no administration in time range)     Initial Impression / Assessment and Plan / ED Course  I have reviewed the triage vital signs and the nursing notes.  Pertinent labs & imaging results that were available during my care of the patient were reviewed by me and considered in my medical decision making (see chart for details).        Patient to ED for evaluation of upper abdominal pain that started this afternoon. Nausea, without vomiting. No chest pain or fever.   Patient's pain improved with Fentanyl, Zofran. She now complains of hip and back pain per her history of arthritis. Hospital bed brought  to the room for comfort. Morphine given as well.   1:25 - Patient to CT scan by wheelchair.  2:00 - CT shows bibasilar patchy airspace opacity - favor chronic condition as there is no fever, c/o SOB or new cough. Also findings concerning for choledocholithiasis vs biliary dyskinesia. [place] ordered for further characterization. Patient is now comfortable, no complaints of pain.   3:30 - dilated gall bladder with no gall stones visualized. No wall thickening. CBD 5 mm. Cystic duct obstruction  vs biliary dyskinesia.   Plan discussed with the patient is for admission to the hospital by medicine with GI consult, however, the patient states she does not want to stay in the hospital. Discussed that a firm diagnosis has not been established and there is the possibility she could go home and decline, become significantly sicker or may die from dx or complication. The patient acknowledges understanding and still requests discharge home. Attempt was make to call granddaughter was unsuccessful. Left message for her to return call. Patient updated.   5:00 - granddaughter returned call and spoke to Dr. Leonides Schanz who conveyed the patient's condition and desire to be discharged AMA. She will come to the hospital to speak to her and encourage admission but will take her home if patient continues to refuse.   6:30 - patient care signed out to [employer], PA-C, pending granddaughter's arrival and subsequent conversation.    Final Clinical Impressions(s) / ED Diagnoses   Final diagnoses:  None   1. Abdominal pain 2. Abnormal [place] 3. Elevated LFT's  ED Discharge Orders    None       Charlann Lange, PA-C 11/24/18 2228    Ward, Delice Bison, DO 11/24/18 2308

## 2018-11-23 NOTE — ED Notes (Signed)
Call grand daughter when pt is discharged and with an update her name is [name] [name]

## 2018-11-23 NOTE — ED Notes (Signed)
Once Pt was settled in the room she requested the door be left open because she is Claustrophobic and does not want it shut. Tech explained that if the door is left open she must keep her mask on because she is exposed to hallway germs, but Pt will not leave it in place stating that it suffocates her

## 2018-11-23 NOTE — ED Triage Notes (Signed)
Pt here via [facility] from home, constant bilateral upper quadrant abdominal pain x 4 hours. En route pt had a run of trigeminy, 12-lead unremarkable.  Pt reports pain is getting worse. A&O x4.

## 2018-11-24 ENCOUNTER — Emergency Department (HOSPITAL_COMMUNITY): Payer: Medicare HMO

## 2018-11-24 DIAGNOSIS — K828 Other specified diseases of gallbladder: Secondary | ICD-10-CM | POA: Diagnosis not present

## 2018-11-24 DIAGNOSIS — K838 Other specified diseases of biliary tract: Secondary | ICD-10-CM | POA: Diagnosis not present

## 2018-11-24 DIAGNOSIS — R1013 Epigastric pain: Secondary | ICD-10-CM | POA: Diagnosis not present

## 2018-11-24 LAB — SARS CORONAVIRUS 2 BY RT PCR (HOSPITAL ORDER, PERFORMED IN ~~LOC~~ HOSPITAL LAB): SARS Coronavirus 2: NEGATIVE

## 2018-11-24 LAB — URINE CULTURE: Culture: NO GROWTH

## 2018-11-24 LAB — MAGNESIUM: Magnesium: 1.8 mg/dL (ref 1.7–2.4)

## 2018-11-24 MED ORDER — MORPHINE SULFATE (PF) 2 MG/ML IV SOLN
2.0000 mg | Freq: Once | INTRAVENOUS | Status: AC
  Administered 2018-11-24: 2 mg via INTRAVENOUS

## 2018-11-24 MED ORDER — MORPHINE SULFATE (PF) 2 MG/ML IV SOLN
INTRAVENOUS | Status: AC
  Filled 2018-11-24: qty 1

## 2018-11-24 MED ORDER — ONDANSETRON HCL 4 MG/2ML IJ SOLN
4.0000 mg | Freq: Once | INTRAMUSCULAR | Status: AC
  Administered 2018-11-24: 01:00:00 4 mg via INTRAVENOUS
  Filled 2018-11-24: qty 2

## 2018-11-24 MED ORDER — IOHEXOL 300 MG/ML  SOLN
75.0000 mL | Freq: Once | INTRAMUSCULAR | Status: AC | PRN
  Administered 2018-11-24: 75 mL via INTRAVENOUS

## 2018-11-24 MED ORDER — TECHNETIUM TC 99M MEBROFENIN IV KIT
5.0000 | PACK | Freq: Once | INTRAVENOUS | Status: AC | PRN
  Administered 2018-11-24: 5 via INTRAVENOUS

## 2018-11-24 NOTE — ED Provider Notes (Signed)
Medical screening examination/treatment/procedure(s) were conducted as a shared visit with non-physician practitioner(s) and myself.  I personally evaluated the patient during the encounter.  EKG Interpretation  Date/Time:  Thursday November 23 2018 22:04:36 EDT Ventricular Rate:  57 PR Interval:    QRS Duration: 93 QT Interval:  418 QTC Calculation: 407 R Axis:   -3 Text Interpretation:  Sinus rhythm Multiple ventricular premature complexes Anterior infarct, old Since prior ECG, PVCs new Confirmed by Gareth Morgan [phone]) on 11/23/2018 10:58:42 PM  Patient is an 85 year old female who presented to the emergency department with upper abdominal pain.  CT imaging shows fluid-filled gallbladder with mild common bile duct and intrahepatic biliary ductal dilatation without definite stones.  Ultrasound obtained which again showed nonspecific dilated gallbladder due to possible cystic duct obstruction versus biliary dyskinesia.  Recommended admission to medicine and GI consultation which patient refused.  Patient wanting to go home.  Patient feeling better after pain medication.  Granddaughter updated as well.  Granddaughter will pick patient up from the emergency department and they will follow-up with GI as an outpatient.  Patient appears to have capacity to make decision for herself and understands risk of leaving without further work-up.   Willia Lampert, Delice Bison, DO 11/24/18 [phone]

## 2018-11-24 NOTE — ED Notes (Signed)
Pt moved to hospital bed for comfort; c/o nausea; provider placing orders

## 2018-11-24 NOTE — ED Provider Notes (Signed)
  Physical Exam  BP (!) 160/47   Pulse (!) 58   Temp 97.6 F (36.4 C) (Oral)   Resp (!) 23   SpO2 97%   Physical Exam No significant change from previous providers physical exam. ED Course/Procedures     Procedures  MDM  Patient is an 85 y/o F with history and physical exam as per previous providers note who presents to the emergency department for evaluation of abdominal pain.  CT scan and ultrasound have demonstrated nonspecific dilated gallbladder.  Gastroenterology and surgery have been consulted and the plan at this time is for patient to undergo nuclear medicine study for further characterization of gallbladder area.  Plan at the time of transition of care is to follow-up the results of the study and discharge if negative with close GI and PCP follow-up and reengage surgery if positive for likely cholecystectomy.  Nuclear medicine study demonstrated findings concerning for cholecystitis therefore patient was discussed with surgery.  Patient does not want surgery at this time.  She would prefer to be discharged home as her abdominal pain is no longer present and she is able to tolerate oral intake.  Patient and her daughter were given my usual customary discussion regarding cholecystitis including anticipatory guidance and strict return precautions.  Shared decision-making was employed in this patient's case and she was discharged as per hers and her daughter's wishes..  Patient was discharged in stable condition.  Patient was seen in conjunction with my attending physician Dr. Leslie Dales, Harrell Gave, MD 11/24/18 [id number]    Margette Fast, MD 11/25/18 [phone]

## 2018-11-24 NOTE — ED Notes (Signed)
RN put pt on bedpan to urinate

## 2018-11-24 NOTE — Discharge Instructions (Addendum)
PLEASE return to the emergency department at any time, but especially if you develop severe pain, run a high fever, have uncontrolled vomiting, a yellow discoloration to your skin or eyes (jaundice).

## 2018-11-24 NOTE — Consult Note (Signed)
Reason for Consult: Abdominal pain Referring Physician: Long MD  [name] [name] is an 85 y.o. female.  HPI: Asked see patient at the request of Dr. Laverta Baltimore for a 1 day history of epigastric abdominal pain.  She came in about 20 hours ago with epigastric pain.  She has had an extensive lengthy work-up in the emergency room over the last 20 hours.  Ultrasound showed a dilated gallbladder without gallstones.  A HIDA was ordered which showed nonvisualization of the gallbladder.  Currently, she denies abdominal pain, nausea or vomiting.  She refuses any surgical intervention and wants to go home.  She is hungry and thirsty.  Past Medical History:  Diagnosis Date  . CARCINOMA, BASAL CELL 02/13/2006  . CAROTID ARTERY DISEASE 10/03/2006   Carotid US 11/17: Stable 40-59% bilateral ICA stenosis >> f/u 1 year  . CHRONIC OBSTRUCTIVE PULMONARY DISEASE, ACUTE EXACERBATION 11/17/2009  . Chronic rhinitis 12/13/2006  . CORONARY ARTERY DISEASE 02/13/2006  . DEGENERATIVE JOINT DISEASE 09/27/2006  . DERMATITIS 09/25/2007  . Head trauma    closed  . HYPERLIPIDEMIA 03/10/2008  . HYPERTENSION 02/13/2006  . HYPOTHYROIDISM 01/30/2007  . LIVER FUNCTION TESTS, ABNORMAL 01/22/2009  . LOSS, HEARING NOS 08/25/2006  . Near syncope 09/18/2014  . NEOPLASM, SKIN, UNCERTAIN BEHAVIOR [id number]  . NEPHROLITHIASIS, HX OF 02/27/2010  . OSTEOARTHRITIS, HAND 12/16/2008  . PERIPHERAL VASCULAR DISEASE 02/13/2006  . Personal history of malignant neoplasm of breast 07/24/2008  . Personal history of urinary calculi   . Post-traumatic wound infection 12/01/2010   Mild streaking superiorly and on one day of septra  Will give rocephin and add keflex  adn follow up   . POSTHERPETIC NEURALGIA 08/25/2006  . RENAL ARTERY STENOSIS 08/01/2008  . Shingles    recurrent  . SPINAL STENOSIS 11/01/2006  . THROMBOCYTOPENIA 07/30/2008  . UNS ADVRS EFF UNS RX MEDICINAL&BIOLOGICAL SBSTNC 01/30/2007  . UNSPECIFIED ANEMIA 03/18/2008  . Unspecified vitamin  D deficiency 02/07/2007  . UNSTABLE ANGINA 12/12/2009  . Vitreous detachment     Past Surgical History:  Procedure Laterality Date  . ABDOMINAL HYSTERECTOMY    . BREAST LUMPECTOMY    . BREAST LUMPECTOMY  1998  . CATARACT EXTRACTION    . CORONARY ANGIOPLASTY WITH STENT PLACEMENT     bilat iliac stents, left renal artery and rt coronary artery last myoview 8/09 with EF 70%  . LUMBAR LAMINECTOMY    . SKIN BIOPSY  08/24/2017   Invasive squamous cell carcinoma  . SKIN CANCER EXCISION    . TONSILLECTOMY      Family History  Problem Relation Age of Onset  . Breast cancer Daughter   . Tuberculosis Father     Social History:  reports that she has been smoking cigarettes. She has a 30.00 pack-year smoking history. She has never used smokeless tobacco. She reports that she does not drink alcohol or use drugs.  Allergies:  Allergies  Allergen Reactions  . Medrol [Methylprednisolone] Other (See Comments)    Reported to have hallucinations by daughter after 2 doses of Medrol has been able to take prednisone this was in the setting of pneumonia  . Statins Other (See Comments)    Elevated liver enzymes  . Oxycodone Anxiety and Other (See Comments)    Also states, "It makes me feel like a zombie"  . Albuterol Other (See Comments)    Shakes   . Hydrocodone-Acetaminophen Nausea Only    Dizziness  . Tequin Other (See Comments)    Patient doesn't recall  Medications: I have reviewed the patient's current medications.  Results for orders placed or performed during the hospital encounter of 11/23/18 (from the past 48 hour(s))  CBC with Differential     Status: None   Collection Time: 11/23/18 10:19 PM  Result Value Ref Range   WBC 7.9 4.0 - 10.5 K/uL   RBC 4.47 3.87 - 5.11 MIL/uL   Hemoglobin 13.6 12.0 - 15.0 g/dL   HCT 42.1 36.0 - 46.0 %   MCV 94.2 80.0 - 100.0 fL   MCH 30.4 26.0 - 34.0 pg   MCHC 32.3 30.0 - 36.0 g/dL   RDW 13.6 11.5 - 15.5 %   Platelets 174 150 - 400 K/uL    nRBC 0.0 0.0 - 0.2 %   Neutrophils Relative % 81 %   Neutro Abs 6.4 1.7 - 7.7 K/uL   Lymphocytes Relative 9 %   Lymphs Abs 0.7 0.7 - 4.0 K/uL   Monocytes Relative 8 %   Monocytes Absolute 0.6 0.1 - 1.0 K/uL   Eosinophils Relative 2 %   Eosinophils Absolute 0.2 0.0 - 0.5 K/uL   Basophils Relative 0 %   Basophils Absolute 0.0 0.0 - 0.1 K/uL   Immature Granulocytes 0 %   Abs Immature Granulocytes 0.02 0.00 - 0.07 K/uL    Comment: Performed at [facility], [address], [place], [place] [address]  Lipase, blood     Status: None   Collection Time: 11/23/18 10:19 PM  Result Value Ref Range   Lipase 38 11 - 51 U/L    Comment: Performed at [facility], [place] [address], [place], [place] [address]  Comprehensive metabolic panel     Status: Abnormal   Collection Time: 11/23/18 10:19 PM  Result Value Ref Range   Sodium 141 135 - 145 mmol/L   Potassium 3.5 3.5 - 5.1 mmol/L   Chloride 107 98 - 111 mmol/L   CO2 24 22 - 32 mmol/L   Glucose, Bld 108 (H) 70 - 99 mg/dL   BUN 28 (H) 8 - 23 mg/dL   Creatinine, Ser 1.16 (H) 0.44 - 1.00 mg/dL   Calcium 9.4 8.9 - 10.3 mg/dL   Total Protein 5.8 (L) 6.5 - 8.1 g/dL   Albumin 3.2 (L) 3.5 - 5.0 g/dL   AST 67 (H) 15 - 41 U/L   ALT 26 0 - 44 U/L   Alkaline Phosphatase 84 38 - 126 U/L   Total Bilirubin 1.7 (H) 0.3 - 1.2 mg/dL   GFR calc non Af Amer 42 (L) >60 mL/min   GFR calc Af Amer 49 (L) >60 mL/min   Anion gap 10 5 - 15    Comment: Performed at [facility], [place] [address] [place], [place] [address]  Urinalysis, Routine w reflex microscopic     Status: Abnormal   Collection Time: 11/23/18 10:58 PM  Result Value Ref Range   Color, Urine YELLOW YELLOW   APPearance CLEAR CLEAR   Specific Gravity, Urine 1.018 1.005 - 1.030   pH 5.0 5.0 - 8.0   Glucose, UA NEGATIVE NEGATIVE mg/dL   Hgb urine dipstick MODERATE (A) NEGATIVE   Bilirubin Urine NEGATIVE NEGATIVE   Ketones, ur 5 (A) NEGATIVE mg/dL   Protein, ur NEGATIVE NEGATIVE  mg/dL   Nitrite NEGATIVE NEGATIVE   Leukocytes,Ua NEGATIVE NEGATIVE   RBC / HPF 6-10 0 - 5 RBC/hpf   WBC, UA 0-5 0 - 5 WBC/hpf   Bacteria, UA NONE SEEN NONE  SEEN   Squamous Epithelial / LPF 0-5 0 - 5   Mucus PRESENT     Comment: Performed at [facility], [place] [address] [place], [place] [address]  Lactic acid, plasma     Status: None   Collection Time: 11/23/18 11:20 PM  Result Value Ref Range   Lactic Acid, Venous 1.2 0.5 - 1.9 mmol/L    Comment: Performed at [facility] [address] [place], [place] [address]  Magnesium     Status: None   Collection Time: 11/23/18 11:45 PM  Result Value Ref Range   Magnesium 1.8 1.7 - 2.4 mg/dL    Comment: Performed at [facility] [address] [place], [place] [address]  SARS Coronavirus 2 by RT PCR (hospital order, performed in [facility] hospital lab) Nasopharyngeal Nasopharyngeal Swab     Status: None   Collection Time: 11/24/18  8:20 AM   Specimen: Nasopharyngeal Swab  Result Value Ref Range   SARS Coronavirus 2 NEGATIVE NEGATIVE    Comment: (NOTE) If result is NEGATIVE SARS-CoV-2 target nucleic acids are NOT DETECTED. The SARS-CoV-2 RNA is generally detectable in upper and lower  respiratory specimens during the acute phase of infection. The lowest  concentration of SARS-CoV-2 viral copies this assay can detect is 250  copies / mL. A negative result does not preclude SARS-CoV-2 infection  and should not be used as the sole basis for treatment or other  patient management decisions.  A negative result may occur with  improper specimen collection / handling, submission of specimen other  than nasopharyngeal swab, presence of viral mutation(s) within the  areas targeted by this assay, and inadequate number of viral copies  (<250 copies / mL). A negative result must be combined with clinical  observations, patient history, and epidemiological information. If result is POSITIVE SARS-CoV-2 target nucleic acids are  DETECTED. The SARS-CoV-2 RNA is generally detectable in upper and lower  respiratory specimens dur ing the acute phase of infection.  Positive  results are indicative of active infection with SARS-CoV-2.  Clinical  correlation with patient history and other diagnostic information is  necessary to determine patient infection status.  Positive results do  not rule out bacterial infection or co-infection with other viruses. If result is PRESUMPTIVE POSTIVE SARS-CoV-2 nucleic acids MAY BE PRESENT.   A presumptive positive result was obtained on the submitted specimen  and confirmed on repeat testing.  While 2019 novel coronavirus  (SARS-CoV-2) nucleic acids may be present in the submitted sample  additional confirmatory testing may be necessary for epidemiological  and / or clinical management purposes  to differentiate between  SARS-CoV-2 and other Sarbecovirus currently known to infect humans.  If clinically indicated additional testing with an alternate test  methodology [phone]) is advised. The SARS-CoV-2 RNA is generally  detectable in upper and lower respiratory sp ecimens during the acute  phase of infection. The expected result is Negative. Fact Sheet for Patients:  StrictlyIdeas.no Fact Sheet for Healthcare Providers: BankingDealers.co.za This test is not yet approved or cleared by the [place] FDA and has been authorized for detection and/or diagnosis of SARS-CoV-2 by FDA under an Emergency Use Authorization (EUA).  This EUA will remain in effect (meaning this test can be used) for the duration of the COVID-19 declaration under Section 564(b)(1) of the Act, 21 U.S.C. section 360bbb-3(b)(1), unless the authorization is terminated or revoked sooner. Performed at [facility], [place] [address], [place], [place] [address]  Ct Abdomen Pelvis W Contrast  Result Date: 11/24/2018 CLINICAL DATA:  Acute abdominal pain  EXAM: CT ABDOMEN AND PELVIS WITH CONTRAST TECHNIQUE: Multidetector CT imaging of the abdomen and pelvis was performed using the standard protocol following bolus administration of intravenous contrast. CONTRAST:  39mL OMNIPAQUE IOHEXOL 300 MG/ML  SOLN COMPARISON:  February 02, 2017 FINDINGS: Lower chest: There is mild cardiomegaly. Small amount of patchy airspace opacity seen at both lung bases with a small right and trace left pleural effusion. No hiatal hernia. Hepatobiliary: There is a dilated fluid-filled gallbladder measuring 4.2 cm. No definite calcified gallstones however are present. There is mild common bile duct dilatation measuring 9 mm with mild intrahepatic biliary ductal dilatation, without calcified gallstones in the bile ducts. Pancreas:  There is mild atrophy of the pancreas. Spleen: Normal in size without focal abnormality. Adrenals/Urinary Tract: Both adrenal glands appear normal. Multiple bilateral low-density lesions are seen throughout both kidneys the largest measuring 5 cm in the lower pole the right kidney. There is anterior rotation of the right kidney. No hydronephrosis is seen. Stomach/Bowel: The stomach, small bowel, and colon are normal in appearance. No inflammatory changes, wall thickening, or obstructive findings.A moderate amount of colonic stool is present. Vascular/Lymphatic: There are no enlarged mesenteric, retroperitoneal, or pelvic lymph nodes. Scattered aortic atherosclerotic calcifications are seen without aneurysmal dilatation. Reproductive: The patient is status post hysterectomy. No adnexal masses or collections seen. Other: No evidence of abdominal wall mass or hernia. Musculoskeletal: No acute or significant osseous findings. IMPRESSION: 1. Bibasilar patchy airspace opacities this could be due to chronic lung changes/atelectasis. 2. Small right and trace left pleural effusion. 3. dilated fluid-filled gallbladder with mild common bile duct and intrahepatic biliary ductal  dilatation. No definite calcified gallstones or surrounding fat stranding changes are seen. This could be due to choledocholithiasis or biliary dyskinesia. Electronically Signed   By: Prudencio Pair M.D.   On: 11/24/2018 01:57   Nm Hepato W/eject Fract  Result Date: 11/24/2018 CLINICAL DATA:  Upper abdominal pain. Distended gallbladder current CT and right upper quadrant ultrasound. Biliary duct dilation. No gallstones visualized. EXAM: NUCLEAR MEDICINE HEPATOBILIARY IMAGING TECHNIQUE: Sequential images of the abdomen were obtained out to 60 minutes following intravenous administration of radiopharmaceutical. 2 mg IV morphine given later in the exam to promote gallbladder filling. RADIOPHARMACEUTICALS:  5.25 mCi Tc-89m  Choletec IV COMPARISON:  Current abdomen and pelvis CT and right upper quadrant ultrasound. FINDINGS: There is homogeneous uptake of radiotracer by the liver. Activity was seen within the common bile duct by 20 minutes following the injection of radiotracer. Small bowel activity was visualized by 45 minutes following the injection of radiotracer. Exam was continued for a total of 2 hours. Morphine was given to promote gallbladder filling. Despite this, no gallbladder activity was seen. At 2 hours, liver activity remained intense with limited radiotracer clearing. IMPRESSION: 1. Nonvisualization of the gallbladder suggesting cystic duct obstruction. 2. Normal radiotracer accumulation by the liver, but delayed clearance of the radiotracer with significant liver radiotracer persisting at 2 hours following injection. This suggests hepatic dysfunction. 3. Patent common bile duct with activity seen in the common bile duct and small bowel. Electronically Signed   By: Lajean Manes M.D.   On: 11/24/2018 16:21   US Abdomen Limited  Result Date: 11/24/2018 CLINICAL DATA:  Dilated gallbladder EXAM: ULTRASOUND ABDOMEN LIMITED RIGHT UPPER QUADRANT COMPARISON:  None. FINDINGS: Gallbladder: The gallbladder  is distended. No shadowing stones are seen. No sonographic Percell Miller sign is seen. Normal wall  thickness measuring 2 mm. Common bile duct: Diameter: 5 mm Liver: No focal lesion identified. Within normal limits in parenchymal echogenicity. Portal vein is patent on color Doppler imaging with normal direction of blood flow towards the liver. Other: There is an anechoic cyst seen within the right kidney measuring 5.6 x 4.1 x 5.1 cm. IMPRESSION: Nonspecific dilated gallbladder which could be due to cystic duct obstruction or biliary dyskinesia. Electronically Signed   By: Prudencio Pair M.D.   On: 11/24/2018 03:29   Dg Chest Portable 1 View  Result Date: 11/23/2018 CLINICAL DATA:  Cough, shortness of breath, diffuse rhonchi. Upper abdominal pain. EXAM: PORTABLE CHEST 1 VIEW COMPARISON:  04/05/2017. CT 02/02/2017 FINDINGS: Chronic hyperinflation and bronchial thickening. Biapical pleuroparenchymal scarring. Unchanged heart size and mediastinal contours with aortic atherosclerosis. Minor streaky bibasilar opacities. No pulmonary edema, large pleural effusion or pneumothorax. Scoliotic curvature of the lower thoracic spine. Bones are under mineralized. IMPRESSION: Chronic hyperinflation and bronchial thickening. Minor streaky bibasilar opacities favor atelectasis or scarring over pneumonia. Aortic Atherosclerosis (ICD10-I70.0). Electronically Signed   By: Keith Rake M.D.   On: 11/23/2018 23:58    Review of Systems  Gastrointestinal: Positive for abdominal pain, nausea and vomiting.  All other systems reviewed and are negative.  Blood pressure (!) 160/47, pulse (!) 58, temperature 97.6 F (36.4 C), temperature source Oral, resp. rate (!) 23, SpO2 97 %. Physical Exam  Constitutional: She is oriented to person, place, and time. She appears cachectic.  HENT:  Head: Normocephalic.  Eyes: Pupils are equal, round, and reactive to light. No scleral icterus.  Neck: Normal range of motion.  Cardiovascular: Normal  rate.  Respiratory: Effort normal and breath sounds normal.  GI: Soft. She exhibits no distension. There is no abdominal tenderness. There is no rebound and no guarding.  Musculoskeletal: Normal range of motion.  Neurological: She is alert and oriented to person, place, and time.  Skin: Skin is warm and dry.  Psychiatric: She has a normal mood and affect. Her behavior is normal.    Assessment/Plan: Abdominal pain Gallbladder disease without gallstones  Dyskinesia ?   HIDA is positive but exam is negative at this point time for acute cholecystitis.  Offered her surgery but she wants to go home and is refusing surgery.  She has no pain.  Recommend giving her diet emergency room to make sure it does not reproduce her pain.  Recommend medical evaluation as well given her complex medical history to evaluate her for potential cholecystectomy if it comes to that.  Okay to be discharged from a surgical standpoint given the fact patient is refusing surgical services.  Eric Nees A Jamarius Saha 11/24/2018, 6:01 PM

## 2018-11-24 NOTE — ED Notes (Signed)
Pt is NSR on monitor 

## 2018-11-24 NOTE — ED Notes (Signed)
Patient verbalizes understanding of discharge instructions. Opportunity for questioning and answers were provided. Armband removed by staff, pt discharged from ED.  

## 2018-11-24 NOTE — ED Notes (Signed)
Talked to nuclear med they said they would send for her soon.

## 2018-11-24 NOTE — ED Notes (Signed)
Patient transported to Ultrasound 

## 2018-11-24 NOTE — ED Provider Notes (Signed)
Care assumed from S. Upstill PA-C at shift change pending family member arrival.  See her note for full H&P.  Plan is for GI consult however patient is currently refusing and requesting to be discharged home with outpatient follow up.  Per previous provider GI consult recommended for dilated gallbladder due to possible cystic duct obstruction versus biliary dyskinesia found on [place]. Labs were remarkable for elevated AST at 67 and total bilirubin of 1.7. UA without infection, no leukocytosis or severe electrolyte derangement.  I discussed findings with patient and granddaughter at the bedside who are agreeable to consulting GI for possible admission and further testing.   Physical Exam  BP (!) 160/47   Pulse (!) 58   Temp 97.6 F (36.4 C) (Oral)   Resp (!) 23   SpO2 97%   Physical Exam PE: Constitutional: well-developed, well-nourished, no apparent distress HENT: normocephalic, atraumatic. no cervical adenopathy Cardiovascular: normal rate and rhythm, distal pulses intact Pulmonary/Chest: effort normal; breath sounds clear and equal bilaterally; no wheezes or rales Abdominal: soft and non tender. No peritoneal signs Musculoskeletal: full ROM, no edema Neurological: alert with goal directed thinking Skin: warm and dry, no rash, no diaphoresis Psychiatric: normal mood and affect, normal behavior   ED Course/Procedures   Results for orders placed or performed during the hospital encounter of 11/23/18 (from the past 24 hour(s))  CBC with Differential     Status: None   Collection Time: 11/23/18 10:19 PM  Result Value Ref Range   WBC 7.9 4.0 - 10.5 K/uL   RBC 4.47 3.87 - 5.11 MIL/uL   Hemoglobin 13.6 12.0 - 15.0 g/dL   HCT 42.1 36.0 - 46.0 %   MCV 94.2 80.0 - 100.0 fL   MCH 30.4 26.0 - 34.0 pg   MCHC 32.3 30.0 - 36.0 g/dL   RDW 13.6 11.5 - 15.5 %   Platelets 174 150 - 400 K/uL   nRBC 0.0 0.0 - 0.2 %   Neutrophils Relative % 81 %   Neutro Abs 6.4 1.7 - 7.7 K/uL   Lymphocytes  Relative 9 %   Lymphs Abs 0.7 0.7 - 4.0 K/uL   Monocytes Relative 8 %   Monocytes Absolute 0.6 0.1 - 1.0 K/uL   Eosinophils Relative 2 %   Eosinophils Absolute 0.2 0.0 - 0.5 K/uL   Basophils Relative 0 %   Basophils Absolute 0.0 0.0 - 0.1 K/uL   Immature Granulocytes 0 %   Abs Immature Granulocytes 0.02 0.00 - 0.07 K/uL  Lipase, blood     Status: None   Collection Time: 11/23/18 10:19 PM  Result Value Ref Range   Lipase 38 11 - 51 U/L  Comprehensive metabolic panel     Status: Abnormal   Collection Time: 11/23/18 10:19 PM  Result Value Ref Range   Sodium 141 135 - 145 mmol/L   Potassium 3.5 3.5 - 5.1 mmol/L   Chloride 107 98 - 111 mmol/L   CO2 24 22 - 32 mmol/L   Glucose, Bld 108 (H) 70 - 99 mg/dL   BUN 28 (H) 8 - 23 mg/dL   Creatinine, Ser 1.16 (H) 0.44 - 1.00 mg/dL   Calcium 9.4 8.9 - 10.3 mg/dL   Total Protein 5.8 (L) 6.5 - 8.1 g/dL   Albumin 3.2 (L) 3.5 - 5.0 g/dL   AST 67 (H) 15 - 41 U/L   ALT 26 0 - 44 U/L   Alkaline Phosphatase 84 38 - 126 U/L   Total Bilirubin 1.7 (H) 0.3 -  1.2 mg/dL   GFR calc non Af Amer 42 (L) >60 mL/min   GFR calc Af Amer 49 (L) >60 mL/min   Anion gap 10 5 - 15  Urinalysis, Routine w reflex microscopic     Status: Abnormal   Collection Time: 11/23/18 10:58 PM  Result Value Ref Range   Color, Urine YELLOW YELLOW   APPearance CLEAR CLEAR   Specific Gravity, Urine 1.018 1.005 - 1.030   pH 5.0 5.0 - 8.0   Glucose, UA NEGATIVE NEGATIVE mg/dL   Hgb urine dipstick MODERATE (A) NEGATIVE   Bilirubin Urine NEGATIVE NEGATIVE   Ketones, ur 5 (A) NEGATIVE mg/dL   Protein, ur NEGATIVE NEGATIVE mg/dL   Nitrite NEGATIVE NEGATIVE   Leukocytes,Ua NEGATIVE NEGATIVE   RBC / HPF 6-10 0 - 5 RBC/hpf   WBC, UA 0-5 0 - 5 WBC/hpf   Bacteria, UA NONE SEEN NONE SEEN   Squamous Epithelial / LPF 0-5 0 - 5   Mucus PRESENT   Lactic acid, plasma     Status: None   Collection Time: 11/23/18 11:20 PM  Result Value Ref Range   Lactic Acid, Venous 1.2 0.5 - 1.9 mmol/L   Magnesium     Status: None   Collection Time: 11/23/18 11:45 PM  Result Value Ref Range   Magnesium 1.8 1.7 - 2.4 mg/dL  SARS Coronavirus 2 by RT PCR (hospital order, performed in [place] hospital lab) Nasopharyngeal Nasopharyngeal Swab     Status: None   Collection Time: 11/24/18  8:20 AM   Specimen: Nasopharyngeal Swab  Result Value Ref Range   SARS Coronavirus 2 NEGATIVE NEGATIVE   ULTRASOUND ABDOMEN LIMITED RIGHT UPPER QUADRANT    COMPARISON: None.    FINDINGS:  Gallbladder:    The gallbladder is distended. No shadowing stones are seen. No  sonographic Percell Miller sign is seen. Normal wall thickness measuring 2  mm.    Common bile duct:    Diameter: 5 mm    Liver:    No focal lesion identified. Within normal limits in parenchymal  echogenicity. Portal vein is patent on color Doppler imaging with  normal direction of blood flow towards the liver.    Other: There is an anechoic cyst seen within the right kidney  measuring 5.6 x 4.1 x 5.1 cm.    IMPRESSION:  Nonspecific dilated gallbladder which could be due to cystic duct  obstruction or biliary dyskinesia.      Electronically Signed  By: Prudencio Pair M.D.  On: 11/24/2018 03:29     CT ABDOMEN AND PELVIS WITH CONTRAST    TECHNIQUE:  Multidetector CT imaging of the abdomen and pelvis was performed  using the standard protocol following bolus administration of  intravenous contrast.    CONTRAST: 48mL OMNIPAQUE IOHEXOL 300 MG/ML SOLN    COMPARISON: February 02, 2017    FINDINGS:  Lower chest: There is mild cardiomegaly. Small amount of patchy  airspace opacity seen at both lung bases with a small right and  trace left pleural effusion.    No hiatal hernia.    Hepatobiliary: There is a dilated fluid-filled gallbladder measuring  4.2 cm. No definite calcified gallstones however are present. There  is mild common bile duct dilatation measuring 9 mm with mild  intrahepatic biliary  ductal dilatation, without calcified gallstones  in the bile ducts.    Pancreas: There is mild atrophy of the pancreas.    Spleen: Normal in size without focal abnormality.    Adrenals/Urinary Tract: Both adrenal  glands appear normal. Multiple  bilateral low-density lesions are seen throughout both kidneys the  largest measuring 5 cm in the lower pole the right kidney. There is  anterior rotation of the right kidney. No hydronephrosis is seen.    Stomach/Bowel: The stomach, small bowel, and colon are normal in  appearance. No inflammatory changes, wall thickening, or obstructive  findings.A moderate amount of colonic stool is present.    Vascular/Lymphatic: There are no enlarged mesenteric,  retroperitoneal, or pelvic lymph nodes. Scattered aortic  atherosclerotic calcifications are seen without aneurysmal  dilatation.    Reproductive: The patient is status post hysterectomy. No adnexal  masses or collections seen.    Other: No evidence of abdominal wall mass or hernia.    Musculoskeletal: No acute or significant osseous findings.    IMPRESSION:  1. Bibasilar patchy airspace opacities this could be due to chronic  lung changes/atelectasis.  2. Small right and trace left pleural effusion.  3. dilated fluid-filled gallbladder with mild common bile duct and  intrahepatic biliary ductal dilatation. No definite calcified  gallstones or surrounding fat stranding changes are seen. This could  be due to choledocholithiasis or biliary dyskinesia.      Electronically Signed  By: Prudencio Pair M.D.  On: 11/24/2018 01:57   PORTABLE CHEST 1 VIEW    COMPARISON: 04/05/2017. CT 02/02/2017    FINDINGS:  Chronic hyperinflation and bronchial thickening. Biapical  pleuroparenchymal scarring. Unchanged heart size and mediastinal  contours with aortic atherosclerosis. Minor streaky bibasilar  opacities. No pulmonary edema, large pleural effusion or  pneumothorax.  Scoliotic curvature of the lower thoracic spine. Bones  are under mineralized.    IMPRESSION:  Chronic hyperinflation and bronchial thickening. Minor streaky  bibasilar opacities favor atelectasis or scarring over pneumonia.    Aortic Atherosclerosis (ICD10-I70.0).      Electronically Signed  By: Keith Rake M.D.  On: 11/23/2018 23:58     MDM   Pt received in sign out. On my exam she is resting comfortably in bed. Abdomen is non tender, no peritoneal signs. As pt is agreeable to stay in the hospital if needed will consult GI for unassigned.  Spoke with Azucena Freed PA-C with [facility] who recommends surgical consult. (Further chart review shows patient had colonoscopy in 2013 with [facility]. If furth GI consult is needed will reach out to [facility]).  Case also discussed with on call surgical PA-C Jackson Latino who recommends HIDA scan for further evaluation. If negative will discharge home with outpatient follow up. If positive will re-consult surgery for further recommendations.  Pt is still with HIDA scan in process at the end of my shift. Patient care transferred to Dr Romona Curls at the end of my shift. Patient presentation, ED course, and plan of care discussed with review of all pertinent labs and imaging. Please see his note for further details regarding further ED course and disposition.   Portions of this note were generated with [employer]. Dictation errors may occur despite best attempts at proofreading.      Cherre Robins, PA-C 11/24/18 1550    Isla Pence, MD 11/27/18 0730

## 2018-11-28 ENCOUNTER — Other Ambulatory Visit (HOSPITAL_COMMUNITY): Payer: Self-pay | Admitting: Cardiology

## 2018-11-28 DIAGNOSIS — I6523 Occlusion and stenosis of bilateral carotid arteries: Secondary | ICD-10-CM

## 2018-12-11 ENCOUNTER — Ambulatory Visit (HOSPITAL_COMMUNITY)
Admission: RE | Admit: 2018-12-11 | Payer: Medicare HMO | Source: Ambulatory Visit | Attending: Internal Medicine | Admitting: Internal Medicine

## 2018-12-12 ENCOUNTER — Encounter: Payer: Self-pay | Admitting: Gastroenterology

## 2019-01-01 ENCOUNTER — Ambulatory Visit (HOSPITAL_COMMUNITY)
Admission: RE | Admit: 2019-01-01 | Payer: Medicare HMO | Source: Ambulatory Visit | Attending: Cardiology | Admitting: Cardiology

## 2019-01-08 ENCOUNTER — Encounter (HOSPITAL_COMMUNITY): Payer: Medicare HMO

## 2019-01-09 ENCOUNTER — Ambulatory Visit (HOSPITAL_COMMUNITY): Payer: Medicare HMO

## 2019-01-11 ENCOUNTER — Ambulatory Visit: Payer: Medicare HMO | Admitting: Gastroenterology

## 2019-01-16 DIAGNOSIS — G894 Chronic pain syndrome: Secondary | ICD-10-CM | POA: Diagnosis not present

## 2019-01-30 ENCOUNTER — Ambulatory Visit (HOSPITAL_COMMUNITY)
Admission: RE | Admit: 2019-01-30 | Payer: Medicare HMO | Source: Ambulatory Visit | Attending: Cardiology | Admitting: Cardiology

## 2019-02-19 ENCOUNTER — Ambulatory Visit (HOSPITAL_COMMUNITY): Payer: Medicare HMO

## 2019-03-06 DIAGNOSIS — G8929 Other chronic pain: Secondary | ICD-10-CM | POA: Diagnosis not present

## 2019-03-06 DIAGNOSIS — I10 Essential (primary) hypertension: Secondary | ICD-10-CM | POA: Diagnosis not present

## 2019-03-06 DIAGNOSIS — R636 Underweight: Secondary | ICD-10-CM | POA: Diagnosis not present

## 2019-03-06 DIAGNOSIS — Z008 Encounter for other general examination: Secondary | ICD-10-CM | POA: Diagnosis not present

## 2019-03-06 DIAGNOSIS — R69 Illness, unspecified: Secondary | ICD-10-CM | POA: Diagnosis not present

## 2019-03-06 DIAGNOSIS — Z7982 Long term (current) use of aspirin: Secondary | ICD-10-CM | POA: Diagnosis not present

## 2019-03-06 DIAGNOSIS — Z681 Body mass index (BMI) 19 or less, adult: Secondary | ICD-10-CM | POA: Diagnosis not present

## 2019-03-06 DIAGNOSIS — I2510 Atherosclerotic heart disease of native coronary artery without angina pectoris: Secondary | ICD-10-CM | POA: Diagnosis not present

## 2019-03-06 DIAGNOSIS — I739 Peripheral vascular disease, unspecified: Secondary | ICD-10-CM | POA: Diagnosis not present

## 2019-03-06 DIAGNOSIS — M1990 Unspecified osteoarthritis, unspecified site: Secondary | ICD-10-CM | POA: Diagnosis not present

## 2019-03-06 DIAGNOSIS — R32 Unspecified urinary incontinence: Secondary | ICD-10-CM | POA: Diagnosis not present

## 2019-03-07 DIAGNOSIS — R69 Illness, unspecified: Secondary | ICD-10-CM | POA: Diagnosis not present

## 2019-06-28 ENCOUNTER — Other Ambulatory Visit: Payer: Self-pay | Admitting: Cardiology

## 2019-07-02 ENCOUNTER — Other Ambulatory Visit: Payer: Self-pay | Admitting: Physician Assistant

## 2019-07-04 ENCOUNTER — Other Ambulatory Visit: Payer: Self-pay

## 2019-07-04 MED ORDER — METOPROLOL SUCCINATE ER 25 MG PO TB24: ORAL_TABLET | ORAL | 2 refills | Status: DC

## 2019-07-04 MED ORDER — AMLODIPINE BESYLATE 10 MG PO TABS: 10.0000 mg | ORAL_TABLET | Freq: Every day | ORAL | 2 refills | Status: DC

## 2019-07-17 ENCOUNTER — Other Ambulatory Visit: Payer: Self-pay

## 2019-07-17 ENCOUNTER — Emergency Department (HOSPITAL_COMMUNITY): Payer: Medicare HMO

## 2019-07-17 ENCOUNTER — Encounter (HOSPITAL_COMMUNITY): Payer: Self-pay | Admitting: Emergency Medicine

## 2019-07-17 ENCOUNTER — Emergency Department (HOSPITAL_COMMUNITY)
Admission: EM | Admit: 2019-07-17 | Discharge: 2019-07-18 | Discharge status: Against Medical Advice | Payer: Medicare HMO | Source: Home / Self Care

## 2019-07-17 DIAGNOSIS — R0902 Hypoxemia: Secondary | ICD-10-CM | POA: Diagnosis not present

## 2019-07-17 DIAGNOSIS — R200 Anesthesia of skin: Secondary | ICD-10-CM | POA: Diagnosis not present

## 2019-07-17 DIAGNOSIS — R0789 Other chest pain: Secondary | ICD-10-CM | POA: Insufficient documentation

## 2019-07-17 DIAGNOSIS — R52 Pain, unspecified: Secondary | ICD-10-CM | POA: Diagnosis not present

## 2019-07-17 DIAGNOSIS — M5489 Other dorsalgia: Secondary | ICD-10-CM | POA: Diagnosis not present

## 2019-07-17 DIAGNOSIS — I10 Essential (primary) hypertension: Secondary | ICD-10-CM | POA: Diagnosis not present

## 2019-07-17 DIAGNOSIS — I959 Hypotension, unspecified: Secondary | ICD-10-CM | POA: Diagnosis not present

## 2019-07-17 DIAGNOSIS — J439 Emphysema, unspecified: Secondary | ICD-10-CM | POA: Diagnosis not present

## 2019-07-17 DIAGNOSIS — Z5321 Procedure and treatment not carried out due to patient leaving prior to being seen by health care provider: Secondary | ICD-10-CM | POA: Insufficient documentation

## 2019-07-17 DIAGNOSIS — R29810 Facial weakness: Secondary | ICD-10-CM | POA: Diagnosis not present

## 2019-07-17 LAB — CBC
HCT: 45.7 % (ref 36.0–46.0)
Hemoglobin: 14.4 g/dL (ref 12.0–15.0)
MCH: 29.6 pg (ref 26.0–34.0)
MCHC: 31.5 g/dL (ref 30.0–36.0)
MCV: 94 fL (ref 80.0–100.0)
Platelets: 206 10*3/uL (ref 150–400)
RBC: 4.86 MIL/uL (ref 3.87–5.11)
RDW: 13.6 % (ref 11.5–15.5)
WBC: 7.3 10*3/uL (ref 4.0–10.5)
nRBC: 0 % (ref 0.0–0.2)

## 2019-07-17 LAB — BASIC METABOLIC PANEL
Anion gap: 8 (ref 5–15)
BUN: 20 mg/dL (ref 8–23)
CO2: 30 mmol/L (ref 22–32)
Calcium: 9.6 mg/dL (ref 8.9–10.3)
Chloride: 105 mmol/L (ref 98–111)
Creatinine, Ser: 1.2 mg/dL — ABNORMAL HIGH (ref 0.44–1.00)
GFR calc Af Amer: 46 mL/min — ABNORMAL LOW (ref >60)
GFR calc non Af Amer: 40 mL/min — ABNORMAL LOW (ref >60)
Glucose, Bld: 87 mg/dL (ref 70–99)
Potassium: 3.5 mmol/L (ref 3.5–5.1)
Sodium: 143 mmol/L (ref 135–145)

## 2019-07-17 LAB — TROPONIN I (HIGH SENSITIVITY): Troponin I (High Sensitivity): 7 ng/L (ref <18)

## 2019-07-17 MED ORDER — SODIUM CHLORIDE 0.9% FLUSH: 3.0000 mL | Freq: Once | INTRAVENOUS | Status: DC

## 2019-07-17 NOTE — ED Notes (Signed)
Pt called for room, no answer x3

## 2019-07-17 NOTE — ED Triage Notes (Signed)
Patient arrives to ED with EMS with complaints of an episode of chest tightness last night. Patient states that her feet have been numb now for months and she wants to get that checked out. Patient CP free at this time. No other complaints.

## 2019-07-19 ENCOUNTER — Inpatient Hospital Stay (HOSPITAL_COMMUNITY)
Admission: EM | Admit: 2019-07-19 | Discharge: 2019-08-02 | DRG: 064 | Disposition: A | Discharge status: Hospice | Payer: Medicare HMO | Attending: Internal Medicine | Admitting: Internal Medicine

## 2019-07-19 ENCOUNTER — Emergency Department (HOSPITAL_COMMUNITY): Payer: Medicare HMO

## 2019-07-19 ENCOUNTER — Observation Stay (HOSPITAL_COMMUNITY): Payer: Medicare HMO

## 2019-07-19 ENCOUNTER — Other Ambulatory Visit: Payer: Self-pay

## 2019-07-19 ENCOUNTER — Other Ambulatory Visit: Payer: Self-pay | Admitting: Physician Assistant

## 2019-07-19 DIAGNOSIS — R531 Weakness: Secondary | ICD-10-CM

## 2019-07-19 DIAGNOSIS — Z716 Tobacco abuse counseling: Secondary | ICD-10-CM

## 2019-07-19 DIAGNOSIS — Z885 Allergy status to narcotic agent status: Secondary | ICD-10-CM

## 2019-07-19 DIAGNOSIS — E43 Unspecified severe protein-calorie malnutrition: Secondary | ICD-10-CM | POA: Diagnosis present

## 2019-07-19 DIAGNOSIS — I6523 Occlusion and stenosis of bilateral carotid arteries: Secondary | ICD-10-CM | POA: Diagnosis present

## 2019-07-19 DIAGNOSIS — R270 Ataxia, unspecified: Secondary | ICD-10-CM | POA: Diagnosis present

## 2019-07-19 DIAGNOSIS — E039 Hypothyroidism, unspecified: Secondary | ICD-10-CM | POA: Diagnosis present

## 2019-07-19 DIAGNOSIS — Z20822 Contact with and (suspected) exposure to covid-19: Secondary | ICD-10-CM | POA: Diagnosis present

## 2019-07-19 DIAGNOSIS — N390 Urinary tract infection, site not specified: Secondary | ICD-10-CM | POA: Diagnosis not present

## 2019-07-19 DIAGNOSIS — E870 Hyperosmolality and hypernatremia: Secondary | ICD-10-CM | POA: Diagnosis present

## 2019-07-19 DIAGNOSIS — J310 Chronic rhinitis: Secondary | ICD-10-CM | POA: Diagnosis present

## 2019-07-19 DIAGNOSIS — I739 Peripheral vascular disease, unspecified: Secondary | ICD-10-CM | POA: Diagnosis present

## 2019-07-19 DIAGNOSIS — Z8673 Personal history of transient ischemic attack (TIA), and cerebral infarction without residual deficits: Secondary | ICD-10-CM

## 2019-07-19 DIAGNOSIS — Z955 Presence of coronary angioplasty implant and graft: Secondary | ICD-10-CM

## 2019-07-19 DIAGNOSIS — Z66 Do not resuscitate: Secondary | ICD-10-CM

## 2019-07-19 DIAGNOSIS — I6330 Cerebral infarction due to thrombosis of unspecified cerebral artery: Secondary | ICD-10-CM

## 2019-07-19 DIAGNOSIS — I10 Essential (primary) hypertension: Secondary | ICD-10-CM | POA: Diagnosis present

## 2019-07-19 DIAGNOSIS — I63512 Cerebral infarction due to unspecified occlusion or stenosis of left middle cerebral artery: Secondary | ICD-10-CM

## 2019-07-19 DIAGNOSIS — E785 Hyperlipidemia, unspecified: Secondary | ICD-10-CM | POA: Diagnosis present

## 2019-07-19 DIAGNOSIS — Z79891 Long term (current) use of opiate analgesic: Secondary | ICD-10-CM

## 2019-07-19 DIAGNOSIS — Z90710 Acquired absence of both cervix and uterus: Secondary | ICD-10-CM

## 2019-07-19 DIAGNOSIS — R4701 Aphasia: Secondary | ICD-10-CM | POA: Diagnosis present

## 2019-07-19 DIAGNOSIS — J441 Chronic obstructive pulmonary disease with (acute) exacerbation: Secondary | ICD-10-CM | POA: Diagnosis not present

## 2019-07-19 DIAGNOSIS — R4781 Slurred speech: Secondary | ICD-10-CM | POA: Diagnosis not present

## 2019-07-19 DIAGNOSIS — J449 Chronic obstructive pulmonary disease, unspecified: Secondary | ICD-10-CM | POA: Diagnosis present

## 2019-07-19 DIAGNOSIS — R0602 Shortness of breath: Secondary | ICD-10-CM

## 2019-07-19 DIAGNOSIS — R0989 Other specified symptoms and signs involving the circulatory and respiratory systems: Secondary | ICD-10-CM | POA: Diagnosis not present

## 2019-07-19 DIAGNOSIS — Z87820 Personal history of traumatic brain injury: Secondary | ICD-10-CM

## 2019-07-19 DIAGNOSIS — R0902 Hypoxemia: Secondary | ICD-10-CM | POA: Diagnosis not present

## 2019-07-19 DIAGNOSIS — R54 Age-related physical debility: Secondary | ICD-10-CM | POA: Diagnosis present

## 2019-07-19 DIAGNOSIS — L8990 Pressure ulcer of unspecified site, unspecified stage: Secondary | ICD-10-CM | POA: Insufficient documentation

## 2019-07-19 DIAGNOSIS — E876 Hypokalemia: Secondary | ICD-10-CM

## 2019-07-19 DIAGNOSIS — I63412 Cerebral infarction due to embolism of left middle cerebral artery: Principal | ICD-10-CM | POA: Diagnosis present

## 2019-07-19 DIAGNOSIS — I2510 Atherosclerotic heart disease of native coronary artery without angina pectoris: Secondary | ICD-10-CM | POA: Diagnosis present

## 2019-07-19 DIAGNOSIS — R001 Bradycardia, unspecified: Secondary | ICD-10-CM | POA: Diagnosis not present

## 2019-07-19 DIAGNOSIS — Z7189 Other specified counseling: Secondary | ICD-10-CM

## 2019-07-19 DIAGNOSIS — Z515 Encounter for palliative care: Secondary | ICD-10-CM

## 2019-07-19 DIAGNOSIS — G459 Transient cerebral ischemic attack, unspecified: Secondary | ICD-10-CM | POA: Diagnosis not present

## 2019-07-19 DIAGNOSIS — T68XXXA Hypothermia, initial encounter: Secondary | ICD-10-CM

## 2019-07-19 DIAGNOSIS — R4702 Dysphasia: Secondary | ICD-10-CM | POA: Diagnosis present

## 2019-07-19 DIAGNOSIS — Z79899 Other long term (current) drug therapy: Secondary | ICD-10-CM

## 2019-07-19 DIAGNOSIS — Z888 Allergy status to other drugs, medicaments and biological substances status: Secondary | ICD-10-CM

## 2019-07-19 DIAGNOSIS — L89151 Pressure ulcer of sacral region, stage 1: Secondary | ICD-10-CM | POA: Diagnosis present

## 2019-07-19 DIAGNOSIS — Z7951 Long term (current) use of inhaled steroids: Secondary | ICD-10-CM

## 2019-07-19 DIAGNOSIS — Z803 Family history of malignant neoplasm of breast: Secondary | ICD-10-CM

## 2019-07-19 DIAGNOSIS — J690 Pneumonitis due to inhalation of food and vomit: Secondary | ICD-10-CM | POA: Diagnosis not present

## 2019-07-19 DIAGNOSIS — Z7982 Long term (current) use of aspirin: Secondary | ICD-10-CM

## 2019-07-19 DIAGNOSIS — Z85828 Personal history of other malignant neoplasm of skin: Secondary | ICD-10-CM

## 2019-07-19 DIAGNOSIS — R29810 Facial weakness: Secondary | ICD-10-CM | POA: Diagnosis not present

## 2019-07-19 DIAGNOSIS — G8191 Hemiplegia, unspecified affecting right dominant side: Secondary | ICD-10-CM | POA: Diagnosis not present

## 2019-07-19 DIAGNOSIS — F17210 Nicotine dependence, cigarettes, uncomplicated: Secondary | ICD-10-CM | POA: Diagnosis present

## 2019-07-19 DIAGNOSIS — R471 Dysarthria and anarthria: Secondary | ICD-10-CM | POA: Diagnosis present

## 2019-07-19 DIAGNOSIS — Z853 Personal history of malignant neoplasm of breast: Secondary | ICD-10-CM

## 2019-07-19 DIAGNOSIS — I129 Hypertensive chronic kidney disease with stage 1 through stage 4 chronic kidney disease, or unspecified chronic kidney disease: Secondary | ICD-10-CM | POA: Diagnosis present

## 2019-07-19 DIAGNOSIS — I700 Atherosclerosis of aorta: Secondary | ICD-10-CM | POA: Diagnosis present

## 2019-07-19 DIAGNOSIS — Z681 Body mass index (BMI) 19 or less, adult: Secondary | ICD-10-CM

## 2019-07-19 DIAGNOSIS — N1831 Chronic kidney disease, stage 3a: Secondary | ICD-10-CM | POA: Diagnosis present

## 2019-07-19 DIAGNOSIS — I959 Hypotension, unspecified: Secondary | ICD-10-CM | POA: Diagnosis not present

## 2019-07-19 DIAGNOSIS — R1310 Dysphagia, unspecified: Secondary | ICD-10-CM

## 2019-07-19 DIAGNOSIS — R059 Cough, unspecified: Secondary | ICD-10-CM

## 2019-07-19 LAB — COMPREHENSIVE METABOLIC PANEL
ALT: 12 U/L (ref 0–44)
AST: 20 U/L (ref 15–41)
Albumin: 3.3 g/dL — ABNORMAL LOW (ref 3.5–5.0)
Alkaline Phosphatase: 77 U/L (ref 38–126)
Anion gap: 10 (ref 5–15)
BUN: 25 mg/dL — ABNORMAL HIGH (ref 8–23)
CO2: 26 mmol/L (ref 22–32)
Calcium: 9.4 mg/dL (ref 8.9–10.3)
Chloride: 108 mmol/L (ref 98–111)
Creatinine, Ser: 1.23 mg/dL — ABNORMAL HIGH (ref 0.44–1.00)
GFR calc Af Amer: 45 mL/min — ABNORMAL LOW (ref >60)
GFR calc non Af Amer: 39 mL/min — ABNORMAL LOW (ref >60)
Glucose, Bld: 90 mg/dL (ref 70–99)
Potassium: 3.4 mmol/L — ABNORMAL LOW (ref 3.5–5.1)
Sodium: 144 mmol/L (ref 135–145)
Total Bilirubin: 0.6 mg/dL (ref 0.3–1.2)
Total Protein: 6 g/dL — ABNORMAL LOW (ref 6.5–8.1)

## 2019-07-19 LAB — CBC WITH DIFFERENTIAL/PLATELET
Abs Immature Granulocytes: 0.03 10*3/uL (ref 0.00–0.07)
Basophils Absolute: 0.1 10*3/uL (ref 0.0–0.1)
Basophils Relative: 1 %
Eosinophils Absolute: 0.2 10*3/uL (ref 0.0–0.5)
Eosinophils Relative: 3 %
HCT: 42.8 % (ref 36.0–46.0)
Hemoglobin: 13.4 g/dL (ref 12.0–15.0)
Immature Granulocytes: 0 %
Lymphocytes Relative: 18 %
Lymphs Abs: 1.3 10*3/uL (ref 0.7–4.0)
MCH: 29.6 pg (ref 26.0–34.0)
MCHC: 31.3 g/dL (ref 30.0–36.0)
MCV: 94.7 fL (ref 80.0–100.0)
Monocytes Absolute: 0.7 10*3/uL (ref 0.1–1.0)
Monocytes Relative: 9 %
Neutro Abs: 5.2 10*3/uL (ref 1.7–7.7)
Neutrophils Relative %: 69 %
Platelets: 178 10*3/uL (ref 150–400)
RBC: 4.52 MIL/uL (ref 3.87–5.11)
RDW: 13.6 % (ref 11.5–15.5)
WBC: 7.5 10*3/uL (ref 4.0–10.5)
nRBC: 0 % (ref 0.0–0.2)

## 2019-07-19 LAB — RAPID URINE DRUG SCREEN, HOSP PERFORMED
Amphetamines: NOT DETECTED
Barbiturates: NOT DETECTED
Benzodiazepines: NOT DETECTED
Cocaine: NOT DETECTED
Opiates: NOT DETECTED
Tetrahydrocannabinol: NOT DETECTED

## 2019-07-19 LAB — URINALYSIS, ROUTINE W REFLEX MICROSCOPIC
Bacteria, UA: NONE SEEN
Bilirubin Urine: NEGATIVE
Glucose, UA: NEGATIVE mg/dL
Ketones, ur: NEGATIVE mg/dL
Leukocytes,Ua: NEGATIVE
Nitrite: NEGATIVE
Protein, ur: NEGATIVE mg/dL
Specific Gravity, Urine: 1.012 (ref 1.005–1.030)
pH: 7 (ref 5.0–8.0)

## 2019-07-19 LAB — I-STAT CHEM 8, ED
BUN: 30 mg/dL — ABNORMAL HIGH (ref 8–23)
Calcium, Ion: 1.22 mmol/L (ref 1.15–1.40)
Chloride: 105 mmol/L (ref 98–111)
Creatinine, Ser: 1.3 mg/dL — ABNORMAL HIGH (ref 0.44–1.00)
Glucose, Bld: 88 mg/dL (ref 70–99)
HCT: 40 % (ref 36.0–46.0)
Hemoglobin: 13.6 g/dL (ref 12.0–15.0)
Potassium: 3.4 mmol/L — ABNORMAL LOW (ref 3.5–5.1)
Sodium: 145 mmol/L (ref 135–145)
TCO2: 31 mmol/L (ref 22–32)

## 2019-07-19 LAB — APTT: aPTT: 21 seconds — ABNORMAL LOW (ref 24–36)

## 2019-07-19 LAB — PROTIME-INR
INR: 1 (ref 0.8–1.2)
Prothrombin Time: 12.4 seconds (ref 11.4–15.2)

## 2019-07-19 MED ORDER — ISOSORBIDE MONONITRATE ER 30 MG PO TB24: 30.0000 mg | ORAL_TABLET | Freq: Every day | ORAL | Status: DC

## 2019-07-19 MED ORDER — AMLODIPINE BESYLATE 10 MG PO TABS: 10.0000 mg | ORAL_TABLET | Freq: Every day | ORAL | Status: DC

## 2019-07-19 MED ORDER — ACETAMINOPHEN 325 MG PO TABS
650.0000 mg | ORAL_TABLET | ORAL | Status: DC | PRN
  Administered 2019-07-22 – 2019-07-25 (×2): 650 mg via ORAL
  Filled 2019-07-19 (×2): qty 2

## 2019-07-19 MED ORDER — ACETAMINOPHEN 160 MG/5ML PO SOLN: 650.0000 mg | ORAL | Status: DC | PRN

## 2019-07-19 MED ORDER — BUDESONIDE 0.5 MG/2ML IN SUSP
1.0000 mg | Freq: Two times a day (BID) | RESPIRATORY_TRACT | Status: DC
  Administered 2019-07-20 – 2019-08-02 (×25): 1 mg via RESPIRATORY_TRACT
  Filled 2019-07-19 (×29): qty 4

## 2019-07-19 MED ORDER — METOPROLOL SUCCINATE ER 25 MG PO TB24: 25.0000 mg | ORAL_TABLET | Freq: Every day | ORAL | Status: DC

## 2019-07-19 MED ORDER — HEPARIN SODIUM (PORCINE) 5000 UNIT/ML IJ SOLN
5000.0000 [IU] | Freq: Three times a day (TID) | INTRAMUSCULAR | Status: DC
  Administered 2019-07-20 – 2019-08-01 (×37): 5000 [IU] via SUBCUTANEOUS
  Filled 2019-07-19 (×37): qty 1

## 2019-07-19 MED ORDER — LORAZEPAM 2 MG/ML IJ SOLN: 1.0000 mg | Freq: Once | INTRAMUSCULAR | Status: DC | PRN

## 2019-07-19 MED ORDER — UMECLIDINIUM BROMIDE 62.5 MCG/INH IN AEPB
1.0000 | INHALATION_SPRAY | Freq: Every day | RESPIRATORY_TRACT | Status: DC
  Administered 2019-07-22 – 2019-07-30 (×6): 1 via RESPIRATORY_TRACT
  Filled 2019-07-19 (×3): qty 7

## 2019-07-19 MED ORDER — LEVALBUTEROL HCL 0.63 MG/3ML IN NEBU
0.6300 mg | INHALATION_SOLUTION | Freq: Four times a day (QID) | RESPIRATORY_TRACT | Status: DC | PRN
  Administered 2019-07-20 – 2019-07-22 (×4): 0.63 mg via RESPIRATORY_TRACT
  Filled 2019-07-19 (×4): qty 3

## 2019-07-19 MED ORDER — ASPIRIN EC 81 MG PO TBEC
81.0000 mg | DELAYED_RELEASE_TABLET | Freq: Every day | ORAL | Status: DC
  Administered 2019-07-20: 81 mg via ORAL
  Filled 2019-07-19: qty 1

## 2019-07-19 MED ORDER — KCL IN DEXTROSE-NACL 20-5-0.45 MEQ/L-%-% IV SOLN
INTRAVENOUS | Status: AC
  Filled 2019-07-19 (×3): qty 1000

## 2019-07-19 MED ORDER — STROKE: EARLY STAGES OF RECOVERY BOOK
Freq: Once | Status: AC
  Filled 2019-07-19 (×2): qty 1

## 2019-07-19 MED ORDER — SODIUM CHLORIDE 0.9 % IV BOLUS
500.0000 mL | Freq: Once | INTRAVENOUS | Status: AC
  Administered 2019-07-19: 500 mL via INTRAVENOUS

## 2019-07-19 MED ORDER — SENNOSIDES-DOCUSATE SODIUM 8.6-50 MG PO TABS: 1.0000 | ORAL_TABLET | Freq: Every evening | ORAL | Status: DC | PRN

## 2019-07-19 MED ORDER — ACETAMINOPHEN 650 MG RE SUPP: 650.0000 mg | RECTAL | Status: DC | PRN

## 2019-07-19 MED ORDER — LORAZEPAM 2 MG/ML IJ SOLN
1.0000 mg | INTRAMUSCULAR | Status: DC | PRN
  Administered 2019-07-19: 1 mg via INTRAVENOUS
  Filled 2019-07-19: qty 1

## 2019-07-19 MED ORDER — SODIUM CHLORIDE 0.9 % IV SOLN: 100.0000 mL/h | INTRAVENOUS | Status: DC

## 2019-07-19 MED ORDER — POTASSIUM CHLORIDE 20 MEQ/15ML (10%) PO SOLN
40.0000 meq | Freq: Once | ORAL | Status: AC
  Administered 2019-07-20: 40 meq via ORAL
  Filled 2019-07-19: qty 30

## 2019-07-19 NOTE — ED Provider Notes (Signed)
[facility] EMERGENCY DEPARTMENT Provider Note   CSN: [id number] Arrival date & time: 07/19/19  1735     History No chief complaint on file.   [name] [name] is a 86 y.o. female.  HPI   Patient presents with concern of speech difficulty and left leg weakness.  Onset was almost 3 days ago, and symptoms have been waxing, waning since that time.  Episodes occur and resolve without clear precipitant.  She was seen yesterday briefly, left prior to completing her evaluation, prior to clinician evaluation. She has no pain. Today she presents via EMS after her granddaughter encouraged her to do so. No obvious new changes in medication, diet, activity. She arrives via EMS; history obtained by the patient and those providers.   Past Medical History:  Diagnosis Date   CARCINOMA, BASAL CELL 02/13/2006   CAROTID ARTERY DISEASE 10/03/2006   Carotid US 11/17: Stable 40-59% bilateral ICA stenosis >> f/u 1 year   CHRONIC OBSTRUCTIVE PULMONARY DISEASE, ACUTE EXACERBATION 11/17/2009   Chronic rhinitis 12/13/2006   CORONARY ARTERY DISEASE 02/13/2006   DEGENERATIVE JOINT DISEASE 09/27/2006   DERMATITIS 09/25/2007   Head trauma    closed   HYPERLIPIDEMIA 03/10/2008   HYPERTENSION 02/13/2006   HYPOTHYROIDISM 01/30/2007   LIVER FUNCTION TESTS, ABNORMAL 01/22/2009   LOSS, HEARING NOS 08/25/2006   Near syncope 09/18/2014   NEOPLASM, SKIN, UNCERTAIN BEHAVIOR 69/67/8938   NEPHROLITHIASIS, HX OF 02/27/2010   OSTEOARTHRITIS, HAND 12/16/2008   PERIPHERAL VASCULAR DISEASE 02/13/2006   Personal history of malignant neoplasm of breast 07/24/2008   Personal history of urinary calculi    Post-traumatic wound infection 12/01/2010   Mild streaking superiorly and on one day of septra  Will give rocephin and add keflex  adn follow up    POSTHERPETIC NEURALGIA 08/25/2006   RENAL ARTERY STENOSIS 08/01/2008   Shingles    recurrent   SPINAL STENOSIS 11/01/2006    THROMBOCYTOPENIA 07/30/2008   UNS ADVRS EFF UNS RX MEDICINAL&BIOLOGICAL SBSTNC 01/30/2007   UNSPECIFIED ANEMIA 03/18/2008   Unspecified vitamin D deficiency 02/07/2007   UNSTABLE ANGINA 12/12/2009   Vitreous detachment     Patient Active Problem List   Diagnosis Date Noted   Aphasia 07/19/2019   Lobar pneumonia ([place]) 06/14/2016   Respiratory failure with hypoxia ([place]) 06/13/2016   Multiple closed pelvic fractures without disruption of pelvic circle ([place]) 04/03/2016   Multiple pelvic fractures ([place]) 04/01/2016   Fall 04/01/2016   Pneumonia 12/19/2015   Advanced age 41/26/2017   Protein-calorie malnutrition ([place]) 11/27/2015   Frailty 11/27/2015   Protein-calorie malnutrition, severe 11/15/2015   COPD exacerbation ([place]) 11/12/2015   CAD (coronary artery disease), native coronary artery 11/12/2015   Statin intolerance 10/11/2014   Headache disorder 09/18/2014   Laceration of right lower leg with complication 11/17/5100   Post-traumatic wound infection 08/27/2013   Rash and nonspecific skin eruption 11/09/2012   Pain of right breast 08/15/2012   Hypokalemia 08/15/2012   Urinary frequency 08/15/2012   HX: breast cancer 08/15/2012   TOBACCO USE 04/21/2012   Arthritis 05/31/2011   Pain of left heel 05/31/2011   Left-sided weakness 05/31/2011   Infected cyst of skin 12/29/2010   Leg cramps 12/12/2010   SOB (shortness of breath) 11/26/2010   Glossitis 11/26/2010   Thrombocytopenia ([place]) 07/21/2010   Traumatic ecchymosis of lower leg 07/21/2010   Atelectasis 07/16/2010   Tinnitus 06/04/2010   NEPHROLITHIASIS, HX OF 02/27/2010   HIP PAIN 02/26/2010   FEMORAL BRUIT, RIGHT 01/02/2010   UNSTABLE  ANGINA 12/12/2009   Essential hypertension, benign 06/03/2009   LIVER FUNCTION TESTS, ABNORMAL 01/22/2009   NEOPLASM, SKIN, UNCERTAIN BEHAVIOR 95/18/8416   OSTEOARTHRITIS, HAND 12/16/2008   UNSPECIFIED DISORDER OF KIDNEY AND URETER 09/17/2008     RENAL ARTERY STENOSIS 08/01/2008   THROMBOCYTOPENIA 07/30/2008   PERSONAL HISTORY OF MALIGNANT NEOPLASM OF BREAST 07/24/2008   WEIGHT LOSS 06/11/2008   GROIN PAIN 06/11/2008   INSOMNIA 04/05/2008   UNSPECIFIED ANEMIA 03/18/2008   Hyperlipidemia 03/10/2008   UNDERWEIGHT 01/17/2008   DERMATITIS 09/25/2007   UNSPECIFIED VITAMIN D DEFICIENCY 02/07/2007   HYPOTHYROIDISM 01/30/2007   CHRONIC RHINITIS 12/13/2006   COPD (chronic obstructive pulmonary disease) ([place]) 11/21/2006   SPINAL STENOSIS 11/01/2006   Carotid arterial disease ([place]) 10/03/2006   DEGENERATIVE JOINT DISEASE 09/27/2006   LOSS, HEARING NOS 08/25/2006   TMJ PAIN 08/25/2006   CARCINOMA, BASAL CELL 02/13/2006   Coronary atherosclerosis 02/13/2006   Peripheral vascular disease ([place]) 02/13/2006   HEMATURIA, MICROSCOPIC, HX OF 02/13/2006    Past Surgical History:  Procedure Laterality Date   ABDOMINAL HYSTERECTOMY     BREAST LUMPECTOMY     BREAST LUMPECTOMY  1998   CATARACT EXTRACTION     CORONARY ANGIOPLASTY WITH STENT PLACEMENT     bilat iliac stents, left renal artery and rt coronary artery last myoview 8/09 with EF 70%   LUMBAR LAMINECTOMY     SKIN BIOPSY  08/24/2017   Invasive squamous cell carcinoma   SKIN CANCER EXCISION     TONSILLECTOMY       OB History   No obstetric history on file.     Family History  Problem Relation Age of Onset   Breast cancer Daughter    Tuberculosis Father     Social History   Tobacco Use   Smoking status: Current Every Day Smoker    Packs/day: 1.00    Years: 30.00    Pack years: 30.00    Types: Cigarettes    Last attempt to quit: 06/15/2010    Years since quitting: 9.0   Smokeless tobacco: Never Used  Substance Use Topics   Alcohol use: No    Comment: socially   Drug use: No    Home Medications Prior to Admission medications   Medication Sig Start Date End Date Taking? Authorizing Provider  amLODipine (NORVASC) 10 MG  tablet Take 1 tablet (10 mg total) by mouth daily. 07/04/19  Yes Lelon Perla, MD  aspirin 81 MG tablet Take 81 mg by mouth daily.   Yes [provider]  carboxymethylcellulose (REFRESH PLUS) 0.5 % SOLN Place 1 drop into both eyes 4 (four) times daily.   Yes [provider]  fluticasone (FLOVENT HFA) 220 MCG/ACT inhaler Inhale 2 puffs into the lungs 2 (two) times daily. 02/05/17 07/19/19 Yes Bonnell Public, MD  isosorbide mononitrate (IMDUR) 30 MG 24 hr tablet TAKE 1 TABLET BY MOUTH DAILY Patient taking differently: Take 30 mg by mouth daily.  07/19/19  Yes Lelon Perla, MD  levalbuterol (XOPENEX HFA) 45 MCG/ACT inhaler INHALE 2 PUFFS BY MOUTH EVERY 6 HOURS AS NEEDED FOR WHEEZING Patient taking differently: Inhale 2 puffs into the lungs every 6 (six) hours as needed for wheezing.  10/15/15  Yes Rigoberto Noel, MD  metoprolol succinate (TOPROL-XL) 25 MG 24 hr tablet TAKE 1 TABLET(25 MG) BY MOUTH DAILY 07/04/19  Yes Lelon Perla, MD  tiotropium (SPIRIVA) 18 MCG inhalation capsule Place 18 mcg into inhaler and inhale daily.   Yes [provider]  traMADol (ULTRAM) 50 MG tablet Take 50 mg by mouth every 6 (six) hours as needed for moderate pain.  11/18/16  Yes [provider]  nitroGLYCERIN (NITROSTAT) 0.4 MG SL tablet Place 1 tablet (0.4 mg total) under the tongue every 5 (five) minutes as needed for chest pain. 06/23/16 11/24/18  Lelon Perla, MD    Allergies    Medrol [methylprednisolone], Statins, Oxycodone, Albuterol, Hydrocodone-acetaminophen, and Tequin  Review of Systems   Review of Systems  Constitutional:       Per HPI, otherwise negative  HENT:       Per HPI, otherwise negative  Respiratory:       Per HPI, otherwise negative  Cardiovascular:       Per HPI, otherwise negative  Gastrointestinal: Negative for vomiting.  Endocrine:       Negative aside from HPI  Genitourinary:       Neg aside from HPI   Musculoskeletal:       Per  HPI, otherwise negative  Skin: Negative.   Neurological: Positive for speech difficulty and weakness. Negative for syncope.    Physical Exam Updated Vital Signs BP (!) 124/94    Pulse (!) 57    Temp 98 F (36.7 C) (Oral)    Resp 20    Ht 5\' 4"  (1.626 m)    Wt 45.4 kg    SpO2 97%    BMI 17.16 kg/m   Physical Exam Vitals and nursing note reviewed.  Constitutional:      General: She is not in acute distress.    Appearance: She is well-developed.  HENT:     Head: Normocephalic and atraumatic.  Eyes:     Conjunctiva/sclera: Conjunctivae normal.  Cardiovascular:     Rate and Rhythm: Normal rate and regular rhythm.  Pulmonary:     Effort: Pulmonary effort is normal. No respiratory distress.     Breath sounds: Normal breath sounds. No stridor.  Abdominal:     General: There is no distension.  Skin:    General: Skin is warm and dry.  Neurological:     Mental Status: She is alert and oriented to person, place, and time.     Cranial Nerves: No cranial nerve deficit.     Motor: Tremor and atrophy present.     Comments: 4/5 strength both lower extremities, upper extremities similarly symmetric, 4/5. Mild tremor throughout. No pronator drift, no gross discoordination Face is symmetric, speech is brief, though clear.      ED Results / Procedures / Treatments   Labs (all labs ordered are listed, but only abnormal results are displayed) Labs Reviewed  APTT - Abnormal; Notable for the following components:      Result Value   aPTT 21 (*)    All other components within normal limits  COMPREHENSIVE METABOLIC PANEL - Abnormal; Notable for the following components:   Potassium 3.4 (*)    BUN 25 (*)    Creatinine, Ser 1.23 (*)    Total Protein 6.0 (*)    Albumin 3.3 (*)    GFR calc non Af Amer 39 (*)    GFR calc Af Amer 45 (*)    All other components within normal limits  URINALYSIS, ROUTINE W REFLEX MICROSCOPIC - Abnormal; Notable for the following components:   Hgb urine  dipstick MODERATE (*)    All other components within normal limits  I-STAT CHEM 8, ED - Abnormal; Notable for the following components:   Potassium 3.4 (*)  BUN 30 (*)    Creatinine, Ser 1.30 (*)    All other components within normal limits  PROTIME-INR  CBC WITH DIFFERENTIAL/PLATELET  RAPID URINE DRUG SCREEN, HOSP PERFORMED    EKG EKG Interpretation  Date/Time:  Thursday July 19 2019 17:44:45 EDT Ventricular Rate:  56 PR Interval:    QRS Duration: 94 QT Interval:  431 QTC Calculation: 416 R Axis:   -58 Text Interpretation: Sinus rhythm Atrial premature complex LVH with secondary repolarization abnormality Inferior infarct, old Anterior infarct, old Artifact Abnormal ECG Confirmed by Carmin Muskrat [phone]) on 07/19/2019 5:47:20 PM   Radiology MR BRAIN WO CONTRAST  Result Date: 07/19/2019 CLINICAL DATA:  Ataxia. EXAM: MRI HEAD WITHOUT CONTRAST TECHNIQUE: Multiplanar, multiecho pulse sequences of the brain and surrounding structures were obtained without intravenous contrast. COMPARISON:  Head CT 06/08/2016 FINDINGS: Brain: Diffusion imaging does not show any acute or subacute infarction. There chronic small-vessel ischemic changes of the pons. No focal cerebellar stroke. There is an old left occipital cortical and subcortical infarction. There are chronic small-vessel ischemic changes of the cerebral hemispheric white matter. Old small vessel infarction in the right thalamus. No sign of mass lesion, hemorrhage, hydrocephalus or extra-axial collection. Vascular: Major vessels at the base of the brain show flow. Skull and upper cervical spine: 2 foci of signal within the posterior parietal calvarium towards the vertex, most consistent with hemangiomas. Sinuses/Orbits: Clear/normal Other: None IMPRESSION: No acute brain finding. No abnormality seen to explain acute ataxia. Chronic small-vessel change of the pons and hemispheric white matter. 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 Left mastoid effusion. Electronically Signed   By: Nelson Chimes M.D.   On: 07/19/2019 18:52   DG Chest Port 1 View  Result Date: 07/19/2019 CLINICAL DATA:  [place] EXAM: PORTABLE CHEST 1 VIEW COMPARISON:  07/17/2019 FINDINGS: Single frontal view of the chest was performed, excluding the right costophrenic angle by collimation. The cardiac silhouette is unremarkable. Stable atherosclerosis of the aortic arch. No airspace disease, effusion, or pneumothorax. No acute bony abnormalities. IMPRESSION: 1. Stable exam, no acute process. Electronically Signed   By: Randa Ngo M.D.   On: 07/19/2019 18:31    Procedures Procedures (including critical care time)  Medications Ordered in ED Medications  sodium chloride 0.9 % bolus 500 mL (0 mLs Intravenous Stopped 07/19/19 2057)    Followed by  0.9 %  sodium chloride infusion (has no administration in time range)  LORazepam (ATIVAN) injection 1 mg (1 mg Intravenous Given 07/19/19 1808)    ED Course  I have reviewed the triage vital signs and the nursing notes.  Pertinent labs & imaging results that were available during my care of the patient were reviewed by me and considered in my medical decision making (see chart for details).   Update:, Patient in similar condition. MRI results reassuring.  Update: I discussed patient's case with our neurologist on-call. With concern for TIA like episodes, though her MRI is reassuring, she will require admission, neurology follow as a consulting team.  10:03 PM Patient in similar condition. Remaining labs generally reassuring, x-ray reassuring. This elderly female presents with almost 3 days of episodic ataxia, weakness, speech difficulty. On arrival and throughout her several hours in the ED patient is essentially asymptomatic, with no asymmetry of strength, no facial asymmetry. Given the patient's description of neurologic phenomena, there is consideration of TIA. Initial MRI reassuring, x-ray  reassuring, labs reassuring, but with new neurologic phenomena, as above, patient will require admission for further monitoring,  management.  Final Clinical Impression(s) / ED Diagnoses Final diagnoses:  Aphasia  Weakness   MDM Number of Diagnoses or Management Options Aphasia: new, needed workup Weakness: new, needed workup   Amount and/or Complexity of Data Reviewed Clinical lab tests: reviewed Tests in the radiology section of CPT: reviewed Tests in the medicine section of CPT: reviewed Decide to obtain previous medical records or to obtain history from someone other than the patient: yes Obtain history from someone other than the patient: yes Review and summarize past medical records: yes Discuss the patient with other providers: yes Independent visualization of images, tracings, or specimens: yes  Risk of Complications, Morbidity, and/or Mortality Presenting problems: high Diagnostic procedures: high Management options: high  Critical Care Total time providing critical care: < 30 minutes  Patient Progress Patient progress: stable    Carmin Muskrat, MD 07/19/19 2214

## 2019-07-19 NOTE — H&P (Signed)
History and Physical    [name] [name] RFX:[id number] DOB: 27-Feb-1927 DOA: 07/19/2019  PCP: Burnis Medin, MD  Patient coming from: Home via EMS  I have personally briefly reviewed patient's old medical records in [place]  Chief Complaint: Left-sided weakness, intermittent aphasia  HPI: [name] [name] is a 86 y.o. female with medical history significant for CAD s/p stenting to RCA 2005, PVD, COPD, CKD stage III, HTN, HLD, CAS (40-59% stenosis right and left ICAs) who presents to the ED for evaluation of left-sided weakness and intermittent expressive aphasia.    Patient reports intermittent and waxing/waning left-sided weakness and expressive aphasia over the last 2-3 days.  Symptoms would occur and resolve without obvious inciting or improving factors.  She says she currently lives alone and normally is able to complete our her ADLs.  She says she occasionally walks on her own power but does sometimes use a cane/walker to ambulate.  She says since her symptoms started she has had difficulty walking or even standing on her own.  She denies any falls.  She thinks she has been eating okay but feels dehydrated.  She denies any chest pain, palpitations, dyspnea, nausea, vomiting, abdominal pain, dysuria, or diarrhea.  She says she has had weakness in her right lower leg for which she follows with rehab medicine, Dr. Nelva Bush.  ED Course:  Initial vitals showed BP 169/59, pulse 64, RR 19, temp 98.0 Fahrenheit, SPO2 97% on room air.  Labs notable for sodium 144, potassium 3.4, bicarb 26, BUN 25, creatinine 1.23 (baseline 1.1-1.2), WBC 7.5, hemoglobin 13.4, platelets 178,000.  Urinalysis is negative for UTI.  Portable chest x-ray is negative for acute consolidation, edema, or obvious effusion.  Aortic atherosclerosis noted.  MRI brain without contrast is negative for acute or subacute infarct.  Chronic small vessel change in the pons and hemispheric white matter are seen as  well as old left occipital cortical and subcortical infarction.  Patient was given 500 cc normal saline and started on maintenance fluids.  Neurology were consulted and recommended medical admission for further evaluation.  Review of Systems: All systems reviewed and are negative except as documented in history of present illness above.   Past Medical History:  Diagnosis Date  . CARCINOMA, BASAL CELL 02/13/2006  . CAROTID ARTERY DISEASE 10/03/2006   Carotid US 11/17: Stable 40-59% bilateral ICA stenosis >> f/u 1 year  . CHRONIC OBSTRUCTIVE PULMONARY DISEASE, ACUTE EXACERBATION 11/17/2009  . Chronic rhinitis 12/13/2006  . CORONARY ARTERY DISEASE 02/13/2006  . DEGENERATIVE JOINT DISEASE 09/27/2006  . DERMATITIS 09/25/2007  . Head trauma    closed  . HYPERLIPIDEMIA 03/10/2008  . HYPERTENSION 02/13/2006  . HYPOTHYROIDISM 01/30/2007  . LIVER FUNCTION TESTS, ABNORMAL 01/22/2009  . LOSS, HEARING NOS 08/25/2006  . Near syncope 09/18/2014  . NEOPLASM, SKIN, UNCERTAIN BEHAVIOR 26/41/5830  . NEPHROLITHIASIS, HX OF 02/27/2010  . OSTEOARTHRITIS, HAND 12/16/2008  . PERIPHERAL VASCULAR DISEASE 02/13/2006  . Personal history of malignant neoplasm of breast 07/24/2008  . Personal history of urinary calculi   . Post-traumatic wound infection 12/01/2010   Mild streaking superiorly and on one day of septra  Will give rocephin and add keflex  adn follow up   . POSTHERPETIC NEURALGIA 08/25/2006  . RENAL ARTERY STENOSIS 08/01/2008  . Shingles    recurrent  . SPINAL STENOSIS 11/01/2006  . THROMBOCYTOPENIA 07/30/2008  . UNS ADVRS EFF UNS RX MEDICINAL&BIOLOGICAL SBSTNC 01/30/2007  . UNSPECIFIED ANEMIA 03/18/2008  . Unspecified vitamin D deficiency 02/07/2007  .  UNSTABLE ANGINA 12/12/2009  . Vitreous detachment     Past Surgical History:  Procedure Laterality Date  . ABDOMINAL HYSTERECTOMY    . BREAST LUMPECTOMY    . BREAST LUMPECTOMY  1998  . CATARACT EXTRACTION    . CORONARY ANGIOPLASTY WITH STENT PLACEMENT      bilat iliac stents, left renal artery and rt coronary artery last myoview 8/09 with EF 70%  . LUMBAR LAMINECTOMY    . SKIN BIOPSY  08/24/2017   Invasive squamous cell carcinoma  . SKIN CANCER EXCISION    . TONSILLECTOMY      Social History:  reports that she has been smoking cigarettes. She has a 30.00 pack-year smoking history. She has never used smokeless tobacco. She reports that she does not drink alcohol and does not use drugs.  Allergies  Allergen Reactions  . Medrol [Methylprednisolone] Other (See Comments)    Reported to have hallucinations by daughter after 2 doses of Medrol has been able to take prednisone this was in the setting of pneumonia  . Statins Other (See Comments)    Elevated liver enzymes  . Oxycodone Anxiety and Other (See Comments)    Also states, "It makes me feel like a zombie"  . Albuterol Other (See Comments)    Shakes   . Hydrocodone-Acetaminophen Nausea Only    Dizziness  . Tequin Other (See Comments)    Patient doesn't recall    Family History  Problem Relation Age of Onset  . Breast cancer Daughter   . Tuberculosis Father      Prior to Admission medications   Medication Sig Start Date End Date Taking? Authorizing Provider  amLODipine (NORVASC) 10 MG tablet Take 1 tablet (10 mg total) by mouth daily. 07/04/19  Yes Lelon Perla, MD  aspirin 81 MG tablet Take 81 mg by mouth daily.   Yes [provider]  carboxymethylcellulose (REFRESH PLUS) 0.5 % SOLN Place 1 drop into both eyes 4 (four) times daily.   Yes [provider]  fluticasone (FLOVENT HFA) 220 MCG/ACT inhaler Inhale 2 puffs into the lungs 2 (two) times daily. 02/05/17 07/19/19 Yes Bonnell Public, MD  isosorbide mononitrate (IMDUR) 30 MG 24 hr tablet TAKE 1 TABLET BY MOUTH DAILY Patient taking differently: Take 30 mg by mouth daily.  07/19/19  Yes Lelon Perla, MD  levalbuterol (XOPENEX HFA) 45 MCG/ACT inhaler INHALE 2 PUFFS BY MOUTH EVERY 6 HOURS AS NEEDED  FOR WHEEZING Patient taking differently: Inhale 2 puffs into the lungs every 6 (six) hours as needed for wheezing.  10/15/15  Yes Rigoberto Noel, MD  metoprolol succinate (TOPROL-XL) 25 MG 24 hr tablet TAKE 1 TABLET(25 MG) BY MOUTH DAILY 07/04/19  Yes Lelon Perla, MD  tiotropium (SPIRIVA) 18 MCG inhalation capsule Place 18 mcg into inhaler and inhale daily.   Yes [provider]  traMADol (ULTRAM) 50 MG tablet Take 50 mg by mouth every 6 (six) hours as needed for moderate pain.  11/18/16  Yes [provider]  nitroGLYCERIN (NITROSTAT) 0.4 MG SL tablet Place 1 tablet (0.4 mg total) under the tongue every 5 (five) minutes as needed for chest pain. 06/23/16 11/24/18  Lelon Perla, MD    Physical Exam: Vitals:   07/19/19 1945 07/19/19 2016 07/19/19 2030 07/19/19 2100  BP: 94/76 (!) 152/58 (!) 160/69 (!) 124/94  Pulse: (!) 56 (!) 54 (!) 57 (!) 57  Resp: 17 19 20 20   Temp:      TempSrc:  SpO2: 97% 96% 96% 97%  Weight:      Height:       Constitutional: Thin elderly woman resting supine in bed, NAD, calm, comfortable Eyes: PERRL, lids and conjunctivae normal ENMT: Mucous membranes are dry. Posterior pharynx clear of any exudate or lesions.Normal dentition.  Neck: normal, supple, no masses. Respiratory: Coarse upper airway breath sounds bilaterally. Normal respiratory effort. No accessory muscle use.  Cardiovascular: Regular rate and rhythm, no murmurs / rubs / gallops. No extremity edema. 2+ pedal pulses. Abdomen: no tenderness, no masses palpated. No hepatosplenomegaly. Bowel sounds positive.  Musculoskeletal: no clubbing / cyanosis. No joint deformity upper and lower extremities. Good ROM, no contractures. Normal muscle tone.  Skin: no rashes, lesions, ulcers. No induration Neurologic: CN 2-12 grossly intact with slightly slurred speech. Sensation intact, Strength 5/5 left upper and lower extremities and 4/5 right upper and lower extremities. Psychiatric: Normal  judgment and insight. Alert and oriented x 3. Normal mood.   Labs on Admission: I have personally reviewed following labs and imaging studies  CBC: Recent Labs  Lab 07/17/19 1657 07/19/19 1825 07/19/19 1918  WBC 7.3  --  7.5  NEUTROABS  --   --  5.2  HGB 14.4 13.6 13.4  HCT 45.7 40.0 42.8  MCV 94.0  --  94.7  PLT 206  --  836   Basic Metabolic Panel: Recent Labs  Lab 07/17/19 1657 07/19/19 1755 07/19/19 1825  NA 143 144 145  K 3.5 3.4* 3.4*  CL 105 108 105  CO2 30 26  --   GLUCOSE 87 90 88  BUN 20 25* 30*  CREATININE 1.20* 1.23* 1.30*  CALCIUM 9.6 9.4  --    GFR: Estimated Creatinine Clearance: 21 mL/min (A) (by C-G formula based on SCr of 1.3 mg/dL (H)). Liver Function Tests: Recent Labs  Lab 07/19/19 1755  AST 20  ALT 12  ALKPHOS 77  BILITOT 0.6  PROT 6.0*  ALBUMIN 3.3*   No results for input(s): LIPASE, AMYLASE in the last 168 hours. No results for input(s): AMMONIA in the last 168 hours. Coagulation Profile: Recent Labs  Lab 07/19/19 1755  INR 1.0   Cardiac Enzymes: No results for input(s): CKTOTAL, CKMB, CKMBINDEX, TROPONINI in the last 168 hours. BNP (last 3 results) No results for input(s): PROBNP in the last 8760 hours. HbA1C: No results for input(s): HGBA1C in the last 72 hours. CBG: No results for input(s): GLUCAP in the last 168 hours. Lipid Profile: No results for input(s): CHOL, HDL, LDLCALC, TRIG, CHOLHDL, LDLDIRECT in the last 72 hours. Thyroid Function Tests: No results for input(s): TSH, T4TOTAL, FREET4, T3FREE, THYROIDAB in the last 72 hours. Anemia Panel: No results for input(s): VITAMINB12, FOLATE, FERRITIN, TIBC, IRON, RETICCTPCT in the last 72 hours. Urine analysis:    Component Value Date/Time   COLORURINE YELLOW 07/19/2019 1738   APPEARANCEUR CLEAR 07/19/2019 1738   LABSPEC 1.012 07/19/2019 1738   PHURINE 7.0 07/19/2019 1738   GLUCOSEU NEGATIVE 07/19/2019 1738   HGBUR MODERATE (A) 07/19/2019 1738   HGBUR large  02/27/2010 0951   BILIRUBINUR NEGATIVE 07/19/2019 1738   BILIRUBINUR 1+ 08/12/2014 0937   KETONESUR NEGATIVE 07/19/2019 1738   PROTEINUR NEGATIVE 07/19/2019 1738   UROBILINOGEN 0.2 08/12/2014 0937   UROBILINOGEN 0.2 09/21/2010 0236   NITRITE NEGATIVE 07/19/2019 1738   LEUKOCYTESUR NEGATIVE 07/19/2019 1738    Radiological Exams on Admission: MR BRAIN WO CONTRAST  Result Date: 07/19/2019 CLINICAL DATA:  Ataxia. EXAM: MRI HEAD WITHOUT CONTRAST TECHNIQUE: Multiplanar,  multiecho pulse sequences of the brain and surrounding structures were obtained without intravenous contrast. COMPARISON:  Head CT 06/08/2016 FINDINGS: Brain: Diffusion imaging does not show any acute or subacute infarction. There chronic small-vessel ischemic changes of the pons. No focal cerebellar stroke. There is an old left occipital cortical and subcortical infarction. There are chronic small-vessel ischemic changes of the cerebral hemispheric white matter. Old small vessel infarction in the right thalamus. No sign of mass lesion, hemorrhage, hydrocephalus or extra-axial collection. Vascular: Major vessels at the base of the brain show flow. Skull and upper cervical spine: 2 foci of signal within the posterior parietal calvarium towards the vertex, most consistent with hemangiomas. Sinuses/Orbits: Clear/normal Other: None IMPRESSION: No acute brain finding. No abnormality seen to explain acute ataxia. Chronic small-vessel change of the pons and hemispheric white matter. 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 Left mastoid effusion. Electronically Signed   By: Nelson Chimes M.D.   On: 07/19/2019 18:52   DG Chest Port 1 View  Result Date: 07/19/2019 CLINICAL DATA:  [place] EXAM: PORTABLE CHEST 1 VIEW COMPARISON:  07/17/2019 FINDINGS: Single frontal view of the chest was performed, excluding the right costophrenic angle by collimation. The cardiac silhouette is unremarkable. Stable atherosclerosis of the aortic arch. No  airspace disease, effusion, or pneumothorax. No acute bony abnormalities. IMPRESSION: 1. Stable exam, no acute process. Electronically Signed   By: Randa Ngo M.D.   On: 07/19/2019 18:31    EKG: Independently reviewed.  Sinus rhythm with PACs.  Not significantly changed when compared to prior.  Assessment/Plan Principal Problem:   Aphasia Active Problems:   Hyperlipidemia   Essential hypertension, benign   Peripheral vascular disease (HCC)   COPD (chronic obstructive pulmonary disease) (HCC)   Left-sided weakness   Hypokalemia   CAD (coronary artery disease), native coronary artery  EVIAN DERRINGER is a 86 y.o. female with medical history significant for CAD s/p stenting to RCA 2005, PVD, COPD, CKD stage III, HTN, HLD, CAS (40-59% stenosis right and left ICAs) who is admitted for TIA work-up/evaluation of LLE weakness and intermittent expressive aphasia.  Left lower extremity weakness and intermittent expressive aphasia: On and off last 2-3 days per patient.  Speech is fluent and strength on the left side is intact at time of admission.  Has some weakness on the right side although patient states this is ongoing for which she follows with rehab medicine.  MRI brain is negative for acute or subacute infarct.  Neurology consulted and recommended medical admission for further evaluation. -MRA head without contrast -Obtain carotid Dopplers -Echocardiogram -Monitor on telemetry, and continue neurochecks -PT/OT/SLP eval -Check A1c, lipid panel -Continue aspirin 81 mg daily -Patient intolerant to statins -Continue IV fluid hydration overnight -Neurology to see, further recommendations appreciated  CAD s/p stenting to RCA PVD with occluded left SFA on medical management: Chronic and stable, denies any chest pain.  Continue aspirin 81 mg daily.  Resume home Toprol-XL and Imdur tomorrow.  Intolerant to statins.  Carotid artery stenosis: 40-59% left and right ICA stenosis by  carotid Dopplers 03/22/2017. -Update carotid Dopplers -Continue aspirin  Hypokalemia: Replete orally and recheck in a.m.  Hypertension: Resume home Toprol-XL, Imdur, amlodipine tomorrow.  COPD: Chronic and appears stable.  Continue Flovent, Spiriva, and as needed Xopenex.  Hyperlipidemia: Intolerant to statins, preferred dietary change rather than alternate agents per prior cardiology documentation.  CKD stage III: Chronic and appears stable.  Continue to monitor.  DVT prophylaxis: Subcutaneous heparin Code Status: Full code, confirmed  with patient Family Communication: Discussed with patient, she has discussed with family Disposition Plan: From home and likely discharge to home versus SNF Consults called: Neurology Admission status:  Status is: Observation  The patient remains OBS appropriate and will d/c before 2 midnights.  Dispo: The patient is from: Home              Anticipated d/c is to: Home versus SNF pending PT/OT/SLP eval              Anticipated d/c date is: 1 day              Patient currently is not medically stable to d/c.  [name] Finders MD [facility]  If 7PM-7AM, please contact night-coverage [email or web address]  07/19/2019, 10:00 PM

## 2019-07-19 NOTE — Consult Note (Addendum)
Referring Physician: Dr. Vanita Panda    Chief Complaint: Intermittent aphasia  HPI: [name] [name] is an 86 y.o. female with ah PMHx of lumbar laminectomy (has ambulatory difficulty at baseline), COPD, carotid atherosclerotic disease, CAD, coronary stenting, HLD, HTN, smoking habit and PVD who presented to the ED today via EMS with continued LLE weakness and difficulty with speech intermittently. She had been seen on 6/15 after an episode of chest tightness, also complaining of numb feet for months and wanting to have that evaluated. She then left the ED prior to completion of evaluation.   With today's presentation, the patient was alert and oriented x 4 on arrival, denying any CP or other pain. Triage evaluatin revealed BLE weakness, worse on the right, BUE with equal strength and no drift.   On interview of patient by EDP, she complained of speech difficulty and left leg weakness. Onset was 3 days ago with waxing/waning symptoms since then. She states that her symptoms never fully returned back to baseline.   MRI brain obtained in the ED reveals no acute abnormality. Chronic small-vessel changes of the pons and hemispheric white matter, diffuse brain atrophy and an old left occipital cortical and subcortical infarction are noted.   LSN: Tuesday tPA Given: No: Out of time window  Past Medical History:  Diagnosis Date  . CARCINOMA, BASAL CELL 02/13/2006  . CAROTID ARTERY DISEASE 10/03/2006   Carotid US 11/17: Stable 40-59% bilateral ICA stenosis >> f/u 1 year  . CHRONIC OBSTRUCTIVE PULMONARY DISEASE, ACUTE EXACERBATION 11/17/2009  . Chronic rhinitis 12/13/2006  . CORONARY ARTERY DISEASE 02/13/2006  . DEGENERATIVE JOINT DISEASE 09/27/2006  . DERMATITIS 09/25/2007  . Head trauma    closed  . HYPERLIPIDEMIA 03/10/2008  . HYPERTENSION 02/13/2006  . HYPOTHYROIDISM 01/30/2007  . LIVER FUNCTION TESTS, ABNORMAL 01/22/2009  . LOSS, HEARING NOS 08/25/2006  . Near syncope 09/18/2014  . NEOPLASM,  SKIN, UNCERTAIN BEHAVIOR 98/92/1194  . NEPHROLITHIASIS, HX OF 02/27/2010  . OSTEOARTHRITIS, HAND 12/16/2008  . PERIPHERAL VASCULAR DISEASE 02/13/2006  . Personal history of malignant neoplasm of breast 07/24/2008  . Personal history of urinary calculi   . Post-traumatic wound infection 12/01/2010   Mild streaking superiorly and on one day of septra  Will give rocephin and add keflex  adn follow up   . POSTHERPETIC NEURALGIA 08/25/2006  . RENAL ARTERY STENOSIS 08/01/2008  . Shingles    recurrent  . SPINAL STENOSIS 11/01/2006  . THROMBOCYTOPENIA 07/30/2008  . UNS ADVRS EFF UNS RX MEDICINAL&BIOLOGICAL SBSTNC 01/30/2007  . UNSPECIFIED ANEMIA 03/18/2008  . Unspecified vitamin D deficiency 02/07/2007  . UNSTABLE ANGINA 12/12/2009  . Vitreous detachment     Past Surgical History:  Procedure Laterality Date  . ABDOMINAL HYSTERECTOMY    . BREAST LUMPECTOMY    . BREAST LUMPECTOMY  1998  . CATARACT EXTRACTION    . CORONARY ANGIOPLASTY WITH STENT PLACEMENT     bilat iliac stents, left renal artery and rt coronary artery last myoview 8/09 with EF 70%  . LUMBAR LAMINECTOMY    . SKIN BIOPSY  08/24/2017   Invasive squamous cell carcinoma  . SKIN CANCER EXCISION    . TONSILLECTOMY      Family History  Problem Relation Age of Onset  . Breast cancer Daughter   . Tuberculosis Father    Social History:  reports that she has been smoking cigarettes. She has a 30.00 pack-year smoking history. She has never used smokeless tobacco. She reports that she does not drink alcohol and does not  use drugs.  Allergies:  Allergies  Allergen Reactions  . Medrol [Methylprednisolone] Other (See Comments)    Reported to have hallucinations by daughter after 2 doses of Medrol has been able to take prednisone this was in the setting of pneumonia  . Statins Other (See Comments)    Elevated liver enzymes  . Oxycodone Anxiety and Other (See Comments)    Also states, "It makes me feel like a zombie"  . Albuterol Other  (See Comments)    Shakes   . Hydrocodone-Acetaminophen Nausea Only    Dizziness  . Tequin Other (See Comments)    Patient doesn't recall   Medications:  No current facility-administered medications on file prior to encounter.   Current Outpatient Medications on File Prior to Encounter  Medication Sig Dispense Refill  . amLODipine (NORVASC) 10 MG tablet Take 1 tablet (10 mg total) by mouth daily. 90 tablet 2  . aspirin 81 MG tablet Take 81 mg by mouth daily.    . carboxymethylcellulose (REFRESH PLUS) 0.5 % SOLN Place 1 drop into both eyes 4 (four) times daily.    . fluticasone (FLOVENT HFA) 220 MCG/ACT inhaler Inhale 2 puffs into the lungs 2 (two) times daily. 1 Inhaler 2  . isosorbide mononitrate (IMDUR) 30 MG 24 hr tablet TAKE 1 TABLET BY MOUTH DAILY (Patient taking differently: Take 30 mg by mouth daily. ) 90 tablet 2  . levalbuterol (XOPENEX HFA) 45 MCG/ACT inhaler INHALE 2 PUFFS BY MOUTH EVERY 6 HOURS AS NEEDED FOR WHEEZING (Patient taking differently: Inhale 2 puffs into the lungs every 6 (six) hours as needed for wheezing. ) 45 g 4  . metoprolol succinate (TOPROL-XL) 25 MG 24 hr tablet TAKE 1 TABLET(25 MG) BY MOUTH DAILY 90 tablet 2  . tiotropium (SPIRIVA) 18 MCG inhalation capsule Place 18 mcg into inhaler and inhale daily.    . traMADol (ULTRAM) 50 MG tablet Take 50 mg by mouth every 6 (six) hours as needed for moderate pain.   0  . nitroGLYCERIN (NITROSTAT) 0.4 MG SL tablet Place 1 tablet (0.4 mg total) under the tongue every 5 (five) minutes as needed for chest pain. 25 tablet 12     ROS: As per HPI. Comprehensive ROS otherwise negative.   Physical Examination: Blood pressure (!) 160/69, pulse (!) 57, temperature 98 F (36.7 C), temperature source Oral, resp. rate 20, height 5\' 4"  (1.626 m), weight 45.4 kg, SpO2 96 %.  HEENT: [place]/AT Lungs: Grossly audible wheezing Ext: No edema  Neurologic Examination: Mental Status: Alert, with intact orientation and good insight. Speech  is hypophonic and dysarthric but fluent without errors of grammar or syntax.  Naming and repetition intact. Able to follow a 3 step command, but had some initial difficulty. Cranial Nerves: II:  Temporal visual fields intact in upper and lower quadrants. PERRL. III,IV, VI: No ptosis. EOMI. No nystagmus.   V,VII: Right facial droop. Temp sensation equal bilaterally VIII: hearing intact to conversation IX,X: Hypophonic speech XI: Slight lag on the right with shoulder shrug XII: Tongue subtly deviates to the right when extended.  Motor: RUE 4-/5 proximally and distally  RLE 3/5 LUE 5/5 LLE 5/5 Decreased muscle bulk x 4 Positive for right pronator drift Sensory: Temp and light touch intact throughout, bilaterally. No extinction to DSS Deep Tendon Reflexes:  1+ right biceps and brachioradialis 2+ left biceps and brachioradialis 3+ right patella, 0 left patella 0 achilles bilaterally Plantars: Right: Downgoing   Left: Upgoing Cerebellar: Slow and dysmetric with dyssinergia on assessment  of right FNF. Normal FNF on the left. Gait: Deferred  Results for orders placed or performed during the hospital encounter of 07/19/19 (from the past 48 hour(s))  Urinalysis, Routine w reflex microscopic (not at [facility])     Status: Abnormal   Collection Time: 07/19/19  5:38 PM  Result Value Ref Range   Color, Urine YELLOW YELLOW   APPearance CLEAR CLEAR   Specific Gravity, Urine 1.012 1.005 - 1.030   pH 7.0 5.0 - 8.0   Glucose, UA NEGATIVE NEGATIVE mg/dL   Hgb urine dipstick MODERATE (A) NEGATIVE   Bilirubin Urine NEGATIVE NEGATIVE   Ketones, ur NEGATIVE NEGATIVE mg/dL   Protein, ur NEGATIVE NEGATIVE mg/dL   Nitrite NEGATIVE NEGATIVE   Leukocytes,Ua NEGATIVE NEGATIVE   RBC / HPF 6-10 0 - 5 RBC/hpf   WBC, UA 0-5 0 - 5 WBC/hpf   Bacteria, UA NONE SEEN NONE SEEN    Comment: Performed at [facility] [address] [place], [place] [address]  Protime-INR     Status: None   Collection Time:  07/19/19  5:55 PM  Result Value Ref Range   Prothrombin Time 12.4 11.4 - 15.2 seconds   INR 1.0 0.8 - 1.2    Comment: (NOTE) INR goal varies based on device and disease states. Performed at [facility], [place] [address], [place], [place] [address]   APTT     Status: Abnormal   Collection Time: 07/19/19  5:55 PM  Result Value Ref Range   aPTT 21 (L) 24 - 36 seconds    Comment: Performed at [facility] [address], [place], [place] [address]  Comprehensive metabolic panel     Status: Abnormal   Collection Time: 07/19/19  5:55 PM  Result Value Ref Range   Sodium 144 135 - 145 mmol/L   Potassium 3.4 (L) 3.5 - 5.1 mmol/L   Chloride 108 98 - 111 mmol/L   CO2 26 22 - 32 mmol/L   Glucose, Bld 90 70 - 99 mg/dL    Comment: Glucose reference range applies only to samples taken after fasting for at least 8 hours.   BUN 25 (H) 8 - 23 mg/dL   Creatinine, Ser 1.23 (H) 0.44 - 1.00 mg/dL   Calcium 9.4 8.9 - 10.3 mg/dL   Total Protein 6.0 (L) 6.5 - 8.1 g/dL   Albumin 3.3 (L) 3.5 - 5.0 g/dL   AST 20 15 - 41 U/L   ALT 12 0 - 44 U/L   Alkaline Phosphatase 77 38 - 126 U/L   Total Bilirubin 0.6 0.3 - 1.2 mg/dL   GFR calc non Af Amer 39 (L) >60 mL/min   GFR calc Af Amer 45 (L) >60 mL/min   Anion gap 10 5 - 15    Comment: Performed at [facility] [address], [place],  [address]  I-stat chem 8, ED     Status: Abnormal   Collection Time: 07/19/19  6:25 PM  Result Value Ref Range   Sodium 145 135 - 145 mmol/L   Potassium 3.4 (L) 3.5 - 5.1 mmol/L   Chloride 105 98 - 111 mmol/L   BUN 30 (H) 8 - 23 mg/dL   Creatinine, Ser 1.30 (H) 0.44 - 1.00 mg/dL   Glucose, Bld 88 70 - 99 mg/dL    Comment: Glucose reference range applies only to samples taken after fasting for at least 8 hours.   Calcium, Ion 1.22 1.15 - 1.40 mmol/L  TCO2 31 22 - 32 mmol/L   Hemoglobin 13.6 12.0 - 15.0 g/dL   HCT 40.0 36 - 46 %  CBC with Differential     Status: None   Collection Time:  07/19/19  7:18 PM  Result Value Ref Range   WBC 7.5 4.0 - 10.5 K/uL   RBC 4.52 3.87 - 5.11 MIL/uL   Hemoglobin 13.4 12.0 - 15.0 g/dL   HCT 42.8 36 - 46 %   MCV 94.7 80.0 - 100.0 fL   MCH 29.6 26.0 - 34.0 pg   MCHC 31.3 30.0 - 36.0 g/dL   RDW 13.6 11.5 - 15.5 %   Platelets 178 150 - 400 K/uL   nRBC 0.0 0.0 - 0.2 %   Neutrophils Relative % 69 %   Neutro Abs 5.2 1.7 - 7.7 K/uL   Lymphocytes Relative 18 %   Lymphs Abs 1.3 0.7 - 4.0 K/uL   Monocytes Relative 9 %   Monocytes Absolute 0.7 0 - 1 K/uL   Eosinophils Relative 3 %   Eosinophils Absolute 0.2 0 - 0 K/uL   Basophils Relative 1 %   Basophils Absolute 0.1 0 - 0 K/uL   Immature Granulocytes 0 %   Abs Immature Granulocytes 0.03 0.00 - 0.07 K/uL    Comment: Performed at [facility], [address], [place], [place] [address]   MR BRAIN WO CONTRAST  Result Date: 07/19/2019 CLINICAL DATA:  Ataxia. EXAM: MRI HEAD WITHOUT CONTRAST TECHNIQUE: Multiplanar, multiecho pulse sequences of the brain and surrounding structures were obtained without intravenous contrast. COMPARISON:  Head CT 06/08/2016 FINDINGS: Brain: Diffusion imaging does not show any acute or subacute infarction. There chronic small-vessel ischemic changes of the pons. No focal cerebellar stroke. There is an old left occipital cortical and subcortical infarction. There are chronic small-vessel ischemic changes of the cerebral hemispheric white matter. Old small vessel infarction in the right thalamus. No sign of mass lesion, hemorrhage, hydrocephalus or extra-axial collection. Vascular: Major vessels at the base of the brain show flow. Skull and upper cervical spine: 2 foci of signal within the posterior parietal calvarium towards the vertex, most consistent with hemangiomas. Sinuses/Orbits: Clear/normal Other: None IMPRESSION: No acute brain finding. No abnormality seen to explain acute ataxia. Chronic small-vessel change of the pons and hemispheric white matter. Old left  occipital cortical and subcortical infarction. Left mastoid effusion. Electronically Signed   By: Nelson Chimes M.D.   On: 07/19/2019 18:52   DG Chest Port 1 View  Result Date: 07/19/2019 CLINICAL DATA:  [place] EXAM: PORTABLE CHEST 1 VIEW COMPARISON:  07/17/2019 FINDINGS: Single frontal view of the chest was performed, excluding the right costophrenic angle by collimation. The cardiac silhouette is unremarkable. Stable atherosclerosis of the aortic arch. No airspace disease, effusion, or pneumothorax. No acute bony abnormalities. IMPRESSION: 1. Stable exam, no acute process. Electronically Signed   By: Randa Ngo M.D.   On: 07/19/2019 18:31    Assessment: 86 y.o. female presenting with transient spells of expressive aphasia 1. Exam reveals deficits referable to the left MCA territory. Pronator drift on the right suggests cortical dysfunction.  2. MRI brain reveals no acute abnormality. Chronic small-vessel changes of the pons and hemispheric white matter, diffuse brain atrophy and an old left occipital cortical and subcortical infarction are noted. 3. Stroke Risk Factors - carotid atherosclerotic disease, CAD, HLD, HTN, smoking habit and PVD 4. Statin allergy (elevated liver enzymes) 5. Given MRI brain being negative despite continued right sided deficits, suspect  hypoperfusion secondary to critical stenosis. Less likely would be a possible subclinical epileptic phenomenon with right sided Todd's paresis.   Recommendations: 1. CTA of head and neck with CTP (ordered) 2. EEG in AM (ordered) 3. HgbA1c, fasting lipid panel 4. Modified permissive HTN protocol given advanced age. Treat if SBP > 180.  5. PT consult, OT consult, Speech consult 6. Echocardiogram 7. Prophylactic therapy- Continue home ASA. Add Plavix.  8. Risk factor modification 9. Telemetry monitoring 10. Frequent neuro checks  Addendum: -- CTA shows compensated severe left MCA M1 stenosis. CTP is negative for infarct core and  penumbra. -- Continue IVF -- Continue modified permissive HTN protocol.   @Electronically  signed: Dr. Kerney Elbe  07/19/2019, 8:49 PM

## 2019-07-19 NOTE — ED Notes (Signed)
Pt to MRI.  Continues to have some expressive aphasia and she is aware, and feeling frustrated.

## 2019-07-19 NOTE — ED Triage Notes (Signed)
Returns via Ems with continued LLE weakness and difficulty with speech intermittently.  Pt alert and oriented x 4.  Denies any CP or other noted pain, upper resp coarse crackles, denies cough.  Bialteral LE weakness noted, worse to R.  UE equal without drift.  Pt in NAD and pleasant.

## 2019-07-20 ENCOUNTER — Inpatient Hospital Stay (HOSPITAL_COMMUNITY): Payer: Medicare HMO

## 2019-07-20 ENCOUNTER — Encounter (HOSPITAL_COMMUNITY): Payer: Self-pay | Admitting: Internal Medicine

## 2019-07-20 ENCOUNTER — Observation Stay (HOSPITAL_COMMUNITY): Payer: Medicare HMO

## 2019-07-20 DIAGNOSIS — I10 Essential (primary) hypertension: Secondary | ICD-10-CM | POA: Diagnosis not present

## 2019-07-20 DIAGNOSIS — J9811 Atelectasis: Secondary | ICD-10-CM | POA: Diagnosis not present

## 2019-07-20 DIAGNOSIS — I6602 Occlusion and stenosis of left middle cerebral artery: Secondary | ICD-10-CM | POA: Diagnosis not present

## 2019-07-20 DIAGNOSIS — R05 Cough: Secondary | ICD-10-CM | POA: Diagnosis not present

## 2019-07-20 DIAGNOSIS — I6523 Occlusion and stenosis of bilateral carotid arteries: Secondary | ICD-10-CM | POA: Diagnosis not present

## 2019-07-20 DIAGNOSIS — I340 Nonrheumatic mitral (valve) insufficiency: Secondary | ICD-10-CM | POA: Diagnosis not present

## 2019-07-20 DIAGNOSIS — Z20822 Contact with and (suspected) exposure to covid-19: Secondary | ICD-10-CM | POA: Diagnosis not present

## 2019-07-20 DIAGNOSIS — E785 Hyperlipidemia, unspecified: Secondary | ICD-10-CM | POA: Diagnosis present

## 2019-07-20 DIAGNOSIS — Z7401 Bed confinement status: Secondary | ICD-10-CM | POA: Diagnosis not present

## 2019-07-20 DIAGNOSIS — R29810 Facial weakness: Secondary | ICD-10-CM | POA: Diagnosis present

## 2019-07-20 DIAGNOSIS — Z66 Do not resuscitate: Secondary | ICD-10-CM | POA: Diagnosis not present

## 2019-07-20 DIAGNOSIS — I679 Cerebrovascular disease, unspecified: Secondary | ICD-10-CM | POA: Diagnosis not present

## 2019-07-20 DIAGNOSIS — J441 Chronic obstructive pulmonary disease with (acute) exacerbation: Secondary | ICD-10-CM | POA: Diagnosis not present

## 2019-07-20 DIAGNOSIS — I6330 Cerebral infarction due to thrombosis of unspecified cerebral artery: Secondary | ICD-10-CM | POA: Insufficient documentation

## 2019-07-20 DIAGNOSIS — J310 Chronic rhinitis: Secondary | ICD-10-CM | POA: Diagnosis present

## 2019-07-20 DIAGNOSIS — R0989 Other specified symptoms and signs involving the circulatory and respiratory systems: Secondary | ICD-10-CM | POA: Diagnosis not present

## 2019-07-20 DIAGNOSIS — I2510 Atherosclerotic heart disease of native coronary artery without angina pectoris: Secondary | ICD-10-CM | POA: Diagnosis not present

## 2019-07-20 DIAGNOSIS — R531 Weakness: Secondary | ICD-10-CM | POA: Diagnosis not present

## 2019-07-20 DIAGNOSIS — N1831 Chronic kidney disease, stage 3a: Secondary | ICD-10-CM | POA: Diagnosis present

## 2019-07-20 DIAGNOSIS — I129 Hypertensive chronic kidney disease with stage 1 through stage 4 chronic kidney disease, or unspecified chronic kidney disease: Secondary | ICD-10-CM | POA: Diagnosis present

## 2019-07-20 DIAGNOSIS — R21 Rash and other nonspecific skin eruption: Secondary | ICD-10-CM | POA: Diagnosis not present

## 2019-07-20 DIAGNOSIS — I6502 Occlusion and stenosis of left vertebral artery: Secondary | ICD-10-CM | POA: Diagnosis not present

## 2019-07-20 DIAGNOSIS — G459 Transient cerebral ischemic attack, unspecified: Secondary | ICD-10-CM | POA: Diagnosis not present

## 2019-07-20 DIAGNOSIS — E870 Hyperosmolality and hypernatremia: Secondary | ICD-10-CM | POA: Diagnosis not present

## 2019-07-20 DIAGNOSIS — R4702 Dysphasia: Secondary | ICD-10-CM | POA: Diagnosis present

## 2019-07-20 DIAGNOSIS — N390 Urinary tract infection, site not specified: Secondary | ICD-10-CM | POA: Diagnosis not present

## 2019-07-20 DIAGNOSIS — R4701 Aphasia: Secondary | ICD-10-CM

## 2019-07-20 DIAGNOSIS — R270 Ataxia, unspecified: Secondary | ICD-10-CM | POA: Diagnosis not present

## 2019-07-20 DIAGNOSIS — Z7189 Other specified counseling: Secondary | ICD-10-CM | POA: Diagnosis not present

## 2019-07-20 DIAGNOSIS — J439 Emphysema, unspecified: Secondary | ICD-10-CM | POA: Diagnosis not present

## 2019-07-20 DIAGNOSIS — I63412 Cerebral infarction due to embolism of left middle cerebral artery: Secondary | ICD-10-CM | POA: Diagnosis not present

## 2019-07-20 DIAGNOSIS — I361 Nonrheumatic tricuspid (valve) insufficiency: Secondary | ICD-10-CM

## 2019-07-20 DIAGNOSIS — E039 Hypothyroidism, unspecified: Secondary | ICD-10-CM | POA: Diagnosis present

## 2019-07-20 DIAGNOSIS — E876 Hypokalemia: Secondary | ICD-10-CM | POA: Diagnosis not present

## 2019-07-20 DIAGNOSIS — I639 Cerebral infarction, unspecified: Secondary | ICD-10-CM | POA: Diagnosis not present

## 2019-07-20 DIAGNOSIS — J449 Chronic obstructive pulmonary disease, unspecified: Secondary | ICD-10-CM | POA: Diagnosis not present

## 2019-07-20 DIAGNOSIS — R001 Bradycardia, unspecified: Secondary | ICD-10-CM | POA: Diagnosis not present

## 2019-07-20 DIAGNOSIS — R471 Dysarthria and anarthria: Secondary | ICD-10-CM | POA: Diagnosis present

## 2019-07-20 DIAGNOSIS — Z681 Body mass index (BMI) 19 or less, adult: Secondary | ICD-10-CM | POA: Diagnosis not present

## 2019-07-20 DIAGNOSIS — Z515 Encounter for palliative care: Secondary | ICD-10-CM | POA: Diagnosis not present

## 2019-07-20 DIAGNOSIS — E43 Unspecified severe protein-calorie malnutrition: Secondary | ICD-10-CM | POA: Diagnosis not present

## 2019-07-20 DIAGNOSIS — F17210 Nicotine dependence, cigarettes, uncomplicated: Secondary | ICD-10-CM | POA: Diagnosis present

## 2019-07-20 DIAGNOSIS — J690 Pneumonitis due to inhalation of food and vomit: Secondary | ICD-10-CM | POA: Diagnosis not present

## 2019-07-20 DIAGNOSIS — R1310 Dysphagia, unspecified: Secondary | ICD-10-CM | POA: Diagnosis not present

## 2019-07-20 DIAGNOSIS — R0602 Shortness of breath: Secondary | ICD-10-CM | POA: Diagnosis not present

## 2019-07-20 DIAGNOSIS — E7800 Pure hypercholesterolemia, unspecified: Secondary | ICD-10-CM | POA: Diagnosis not present

## 2019-07-20 DIAGNOSIS — I63512 Cerebral infarction due to unspecified occlusion or stenosis of left middle cerebral artery: Secondary | ICD-10-CM | POA: Diagnosis not present

## 2019-07-20 DIAGNOSIS — J90 Pleural effusion, not elsewhere classified: Secondary | ICD-10-CM | POA: Diagnosis not present

## 2019-07-20 DIAGNOSIS — M2550 Pain in unspecified joint: Secondary | ICD-10-CM | POA: Diagnosis not present

## 2019-07-20 DIAGNOSIS — J939 Pneumothorax, unspecified: Secondary | ICD-10-CM | POA: Diagnosis not present

## 2019-07-20 DIAGNOSIS — G8191 Hemiplegia, unspecified affecting right dominant side: Secondary | ICD-10-CM | POA: Diagnosis not present

## 2019-07-20 LAB — BASIC METABOLIC PANEL
Anion gap: 9 (ref 5–15)
BUN: 17 mg/dL (ref 8–23)
CO2: 26 mmol/L (ref 22–32)
Calcium: 8.9 mg/dL (ref 8.9–10.3)
Chloride: 104 mmol/L (ref 98–111)
Creatinine, Ser: 0.9 mg/dL (ref 0.44–1.00)
GFR calc Af Amer: >60 mL/min (ref >60)
GFR calc non Af Amer: 57 mL/min — ABNORMAL LOW (ref >60)
Glucose, Bld: 114 mg/dL — ABNORMAL HIGH (ref 70–99)
Potassium: 3.6 mmol/L (ref 3.5–5.1)
Sodium: 139 mmol/L (ref 135–145)

## 2019-07-20 LAB — CBC
HCT: 45.9 % (ref 36.0–46.0)
Hemoglobin: 14.6 g/dL (ref 12.0–15.0)
MCH: 29.8 pg (ref 26.0–34.0)
MCHC: 31.8 g/dL (ref 30.0–36.0)
MCV: 93.7 fL (ref 80.0–100.0)
Platelets: 187 10*3/uL (ref 150–400)
RBC: 4.9 MIL/uL (ref 3.87–5.11)
RDW: 13.4 % (ref 11.5–15.5)
WBC: 8.3 10*3/uL (ref 4.0–10.5)
nRBC: 0 % (ref 0.0–0.2)

## 2019-07-20 LAB — URINALYSIS, ROUTINE W REFLEX MICROSCOPIC
Bacteria, UA: NONE SEEN
Bilirubin Urine: NEGATIVE
Glucose, UA: NEGATIVE mg/dL
Ketones, ur: NEGATIVE mg/dL
Leukocytes,Ua: NEGATIVE
Nitrite: NEGATIVE
Protein, ur: NEGATIVE mg/dL
Specific Gravity, Urine: 1.01 (ref 1.005–1.030)
pH: 8 (ref 5.0–8.0)

## 2019-07-20 LAB — SARS CORONAVIRUS 2 BY RT PCR (HOSPITAL ORDER, PERFORMED IN ~~LOC~~ HOSPITAL LAB): SARS Coronavirus 2: NEGATIVE

## 2019-07-20 LAB — ECHOCARDIOGRAM COMPLETE
Height: 64 in
Weight: 1600 oz

## 2019-07-20 LAB — LIPID PANEL
Cholesterol: 185 mg/dL (ref 0–200)
HDL: 51 mg/dL (ref >40)
LDL Cholesterol: 120 mg/dL — ABNORMAL HIGH (ref 0–99)
Total CHOL/HDL Ratio: 3.6 RATIO
Triglycerides: 70 mg/dL (ref <150)
VLDL: 14 mg/dL (ref 0–40)

## 2019-07-20 LAB — HEMOGLOBIN A1C
Hgb A1c MFr Bld: 5.6 % (ref 4.8–5.6)
Mean Plasma Glucose: 114 mg/dL

## 2019-07-20 LAB — MAGNESIUM: Magnesium: 1.7 mg/dL (ref 1.7–2.4)

## 2019-07-20 LAB — PHOSPHORUS: Phosphorus: 2.2 mg/dL — ABNORMAL LOW (ref 2.5–4.6)

## 2019-07-20 MED ORDER — POLYVINYL ALCOHOL 1.4 % OP SOLN
1.0000 [drp] | Freq: Four times a day (QID) | OPHTHALMIC | Status: DC
  Administered 2019-07-20 – 2019-07-31 (×45): 1 [drp] via OPHTHALMIC
  Filled 2019-07-20 (×2): qty 15

## 2019-07-20 MED ORDER — ONDANSETRON HCL 4 MG/2ML IJ SOLN
4.0000 mg | Freq: Four times a day (QID) | INTRAMUSCULAR | Status: DC | PRN
  Administered 2019-07-20: 4 mg via INTRAVENOUS
  Filled 2019-07-20: qty 2

## 2019-07-20 MED ORDER — CLOPIDOGREL BISULFATE 75 MG PO TABS
75.0000 mg | ORAL_TABLET | Freq: Every day | ORAL | Status: DC
  Administered 2019-07-20 – 2019-07-30 (×11): 75 mg via ORAL
  Filled 2019-07-20 (×12): qty 1

## 2019-07-20 MED ORDER — TRAMADOL HCL 50 MG PO TABS
50.0000 mg | ORAL_TABLET | Freq: Four times a day (QID) | ORAL | Status: DC | PRN
  Administered 2019-07-21 – 2019-07-30 (×4): 50 mg via ORAL
  Filled 2019-07-20 (×4): qty 1

## 2019-07-20 MED ORDER — EZETIMIBE 10 MG PO TABS
10.0000 mg | ORAL_TABLET | Freq: Every day | ORAL | Status: DC
  Administered 2019-07-20 – 2019-07-30 (×11): 10 mg via ORAL
  Filled 2019-07-20 (×11): qty 1

## 2019-07-20 MED ORDER — ASPIRIN EC 325 MG PO TBEC
325.0000 mg | DELAYED_RELEASE_TABLET | Freq: Every day | ORAL | Status: DC
  Administered 2019-07-21 – 2019-07-30 (×10): 325 mg via ORAL
  Filled 2019-07-20 (×10): qty 1

## 2019-07-20 MED ORDER — IOHEXOL 350 MG/ML SOLN
100.0000 mL | Freq: Once | INTRAVENOUS | Status: AC | PRN
  Administered 2019-07-20: 90 mL via INTRAVENOUS

## 2019-07-20 MED ORDER — HYPROMELLOSE (GONIOSCOPIC) 2.5 % OP SOLN
1.0000 [drp] | Freq: Four times a day (QID) | OPHTHALMIC | Status: DC
  Filled 2019-07-20: qty 15

## 2019-07-20 MED ORDER — CARBOXYMETHYLCELLULOSE SODIUM 0.5 % OP SOLN: 1.0000 [drp] | Freq: Four times a day (QID) | OPHTHALMIC | Status: DC

## 2019-07-20 NOTE — Progress Notes (Signed)
Pt combative and trying to get out of bed at 0510.

## 2019-07-20 NOTE — Progress Notes (Addendum)
Physical Therapy Treatment Patient Details Name: [name] [name] MRN: [id number] DOB: 09/06/1927 Today's Date: 07/20/2019    History of Present Illness Pt is an 85 y/o female who presents with weakness and aphasia. CT and MRI reveal no acute intracranial abnormalities, however CTA/MRA revealed severe L MCA M1 stenosis, severe L vertebral V4 segment stenosis, moderate to severe bilateral PCA stenosis (L>R), and extracranial calcified plaque resulting in bilateral carotid bifurcation stenosis effecting ICA's.     PT Comments    Pt admitted with above diagnosis.  Pt currently with functional limitations due to the deficits listed below (see PT Problem List). Noted pt is still observation status and no acute intracranial changes were seen on imaging. Pt presenting with Unilateral weakness, reported sensation changes, and aphasia. CIR would be the ideal d/c location for her as she was independent prior to admission, however understand she is not a candidate while OBS. Recommending SNF level rehab however will continue to follow and update d/c recommendations if pt becomes inpatient status. Pt will benefit from skilled PT to increase their independence and safety with mobility to allow discharge to the venue listed below.       Follow Up Recommendations  SNF;Supervision/Assistance - 24 hour (CIR if pt becomes inpatient status)     Equipment Recommendations  Other (comment) (TBD by next venue of care)    Recommendations for Other Services       Precautions / Restrictions Precautions Precautions: Fall Restrictions Weight Bearing Restrictions: No    Mobility  Bed Mobility Overal bed mobility: Needs Assistance Bed Mobility: Supine to Sit     Supine to sit: Min assist     General bed mobility comments: Pt reaching for R railing with L hand to scoot towards EOB. Pt able to slowly advance LE's off EOB however required assist to elevate trunk to full sitting position.    Transfers Overall transfer level: Needs assistance Equipment used: Rolling walker (2 wheeled);2 person hand held assist Transfers: Sit to/from [facility] Sit to Stand: Mod assist;+2 physical assistance Stand pivot transfers: Max assist;+2 physical assistance       General transfer comment: Initially SPT to [facility] with +2 HHA, and then transfer to recliner with +2 max assist and RW support. Pt required assistance to facilitate weight shift and advance feet.   Ambulation/Gait             General Gait Details: Unable - pivotal steps around to the chair only   Stairs             Wheelchair Mobility    Modified Rankin (Stroke Patients Only)       Balance                                            Cognition Arousal/Alertness: Awake/alert Behavior During Therapy: WFL for tasks assessed/performed (Tearful at times)                                          Exercises      General Comments        Pertinent Vitals/Pain Pain Assessment: Faces Faces Pain Scale: No hurt    Home Living Family/patient expects to be discharged to:: Private residence Living Arrangements: Alone Available Help at Discharge: Family;Available 24 hours/day ("  near 24 hours a day if not 24 hours a day")         Home Equipment: Walker - 2 wheels;Cane - single point      Prior Function Level of Independence: Independent with assistive device(s)      Comments: Pt reports she uses a cane to walk down to the mailbox   PT Goals (current goals can now be found in the care plan section) Acute Rehab PT Goals Patient Stated Goal: None specifically stated PT Goal Formulation: With patient Time For Goal Achievement: 07/27/19 Potential to Achieve Goals: Good    Frequency    Min 3X/week      PT Plan      Co-evaluation PT/OT/SLP Co-Evaluation/Treatment: Yes Reason for Co-Treatment: Necessary to address cognition/behavior during  functional activity;For patient/therapist safety;To address functional/ADL transfers;Other (comment) (Per chart, pt combative earlier in the morning) PT goals addressed during session: Mobility/safety with mobility;Balance;Proper use of DME        AM-PAC PT "6 Clicks" Mobility   Outcome Measure  Help needed turning from your back to your side while in a flat bed without using bedrails?: None Help needed moving from lying on your back to sitting on the side of a flat bed without using bedrails?: A Little Help needed moving to and from a bed to a chair (including a wheelchair)?: A Lot Help needed standing up from a chair using your arms (e.g., wheelchair or bedside chair)?: A Lot Help needed to walk in hospital room?: Total Help needed climbing 3-5 steps with a railing? : Total 6 Click Score: 13    End of Session Equipment Utilized During Treatment: Gait belt Activity Tolerance: Patient tolerated treatment well Patient left: in chair;with call bell/phone within reach;with chair alarm set Nurse Communication: Mobility status;Need for lift equipment [name]) PT Visit Diagnosis: Unsteadiness on feet (R26.81);Other symptoms and signs involving the nervous system (H73.428)     Time: 7681-1572 PT Time Calculation (min) (ACUTE ONLY): 42 min  Charges:                        [name], PT, DPT Acute Rehabilitation Services Pager: [phone] Office: [phone]    [name] 07/20/2019, 12:58 PM

## 2019-07-20 NOTE — Progress Notes (Signed)
Carotid duplex       has been completed. Preliminary results can be found under CV proc through chart review. Garan Frappier, BS, RDMS, RVT   

## 2019-07-20 NOTE — Procedures (Signed)
Patient Name: [name] [name]  MRN: [id number]  Epilepsy Attending: Lora Havens  Referring Physician/Provider: Dr. Kerney Elbe Date: 07/20/2019 Duration: 24.12 mins  Patient history: 86 year old female presenting with transient spells of expressive aphasia. EEG evaluate for seizures.  Level of alertness: Awake  AEDs during EEG study: None  Technical aspects: This EEG study was done with scalp electrodes positioned according to the 10-20 International system of electrode placement. Electrical activity was acquired at a sampling rate of 500Hz  and reviewed with a high frequency filter of 70Hz  and a low frequency filter of 1Hz . EEG data were recorded continuously and digitally stored.   Description: The posterior dominant rhythm consists of 8 Hz activity of moderate voltage (25-35 uV) seen predominantly in posterior head regions, symmetric and reactive to eye opening and eye closing. EEG showed intermittent 2-3Hz  delta slowing in left temporal region.  Hyperventilation and photic stimulation were not performed.     ABNORMALITY -Intermittent slow, left temporal region  IMPRESSION: This study is suggestive of non specific cortical dysfunction in left temporal region. No seizures or epileptiform discharges were seen throughout the recording.   Jackelin Correia Barbra Sarks

## 2019-07-20 NOTE — Progress Notes (Signed)
At 0033 pt noted to have right side weakness and right facial droop. This was not reported at handoff. Dr. Darlis Loan, and Waunita Schooner, RRT informed. Dr. Cheral Marker and Waunita Schooner at bedside. Pt's rectal temp 95.3 and 95.4 F, [place] informed.

## 2019-07-20 NOTE — Progress Notes (Signed)
HR in 40s while sleep, sustaining mid 50s asymptomatic. [place] updated.

## 2019-07-20 NOTE — Progress Notes (Signed)
EEG completed, results pending. 

## 2019-07-20 NOTE — Plan of Care (Signed)
MRI repeat reviewed, it showed multiple punctate stroke at left BG and CR, consistent with and confirmed clinical suspicion. Cause of stroke likely due to large vessel disease of high-grade left MCA stenosis.  Continue recommendations documented in the progress note.  Neurology will sign off. Please call with questions. Pt will follow up with stroke clinic NP at [facility] in about 4 weeks. Thanks for the consult.  Rosalin Hawking, MD PhD Stroke Neurology 07/20/2019 9:12 PM

## 2019-07-20 NOTE — Progress Notes (Signed)
STROKE TEAM PROGRESS NOTE   INTERVAL HISTORY Her granddaughter is at the bedside.  Pt lying in bed, lethargic but orientated. Intermittent cough with phlegm. Still has right facial droop and right mild hemiparesis. Showed granddaughter about high grade stenosis of left MCA. Concerning for left MCA subcortical infarct, will repeat limited MRI brain.   OBJECTIVE Vitals:   07/20/19 0558 07/20/19 0726 07/20/19 1210 07/20/19 1609  BP:  (!) 155/63 (!) 151/63 (!) 168/53  Pulse: (!) 54 (!) 54 (!) 53 (!) 56  Resp:  16 16 18   Temp:  97.7 F (36.5 C) (!) 97.5 F (36.4 C) 98.1 F (36.7 C)  TempSrc:  Oral Oral Oral  SpO2:  94% 98% 99%  Weight:      Height:        CBC:  Recent Labs  Lab 07/19/19 1918 07/20/19 0407  WBC 7.5 8.3  NEUTROABS 5.2  --   HGB 13.4 14.6  HCT 42.8 45.9  MCV 94.7 93.7  PLT 178 659    Basic Metabolic Panel:  Recent Labs  Lab 07/19/19 1755 07/19/19 1755 07/19/19 1825 07/20/19 0407  NA 144   < > 145 139  K 3.4*   < > 3.4* 3.6  CL 108   < > 105 104  CO2 26  --   --  26  GLUCOSE 90   < > 88 114*  BUN 25*   < > 30* 17  CREATININE 1.23*   < > 1.30* 0.90  CALCIUM 9.4  --   --  8.9  MG  --   --   --  1.7  PHOS  --   --   --  2.2*   < > = values in this interval not displayed.    Lipid Panel:     Component Value Date/Time   CHOL 185 07/20/2019 0407   TRIG 70 07/20/2019 0407   TRIG 83 12/14/2005 1011   HDL 51 07/20/2019 0407   CHOLHDL 3.6 07/20/2019 0407   VLDL 14 07/20/2019 0407   LDLCALC 120 (H) 07/20/2019 0407   HgbA1c:  Lab Results  Component Value Date   HGBA1C 5.5 11/14/2015   Urine Drug Screen:     Component Value Date/Time   LABOPIA NONE DETECTED 07/19/2019 1955   COCAINSCRNUR NONE DETECTED 07/19/2019 1955   LABBENZ NONE DETECTED 07/19/2019 1955   AMPHETMU NONE DETECTED 07/19/2019 1955   THCU NONE DETECTED 07/19/2019 1955   LABBARB NONE DETECTED 07/19/2019 1955    Alcohol Level No results found for:  [place]  EEG 07/20/2019 Velocities in the right ICA are consistent with a 40-59% stenosis.  Left Carotid: Velocities in the left ICA are consistent with a 1-39% stenosis.  Subclavians: Left subclavian artery was stenotic   CT Code Stroke CTA Head W/WO contrast CT Code Stroke CTA Neck W/WO contrast CT Code Stroke Cerebral Perfusion with contrast 07/20/2019 IMPRESSION:  1. Compensated Severe Left MCA M1 stenosis. CTP is negative for infarct core or penumbra. And CT appearance of the brain is stable.  2. Other intracranial CTA findings concordant to the MRA yesterday including: - Severe Left Vertebral V4 segment stenosis. - Moderate to severe bilateral PCA stenosis worse on the left.  3. Extracranial calcified plaque resulting in bilateral carotid bifurcation stenosis affecting the ICAs: 60% on the Right and 55-60% on the Left .  4. No significant cervical vertebral artery stenosis.  5. Aortic Atherosclerosis (ICD10-I70.0) and Emphysema (ICD10-J43.9).   MR ANGIO HEAD WO CONTRAST 07/20/2019  IMPRESSION:  1. 6 mm short segment severe stenosis involving the mid-distal left M1 segment. Distal left MCA branches are perfused, although attenuated as compared to the contralateral right MCA branches.  2. Nonvisualization of the left vertebral artery as it courses into the cranial vault, which may be partially occluded. Retrograde filling of the left V4 segment and left PICA across the vertebrobasilar junction.  3. Atheromatous change throughout the PCAs bilaterally with associated moderate bilateral P2 stenoses.   MR BRAIN WO CONTRAST 07/19/2019 IMPRESSION:  No acute brain finding. No abnormality seen to explain acute ataxia. Chronic small-vessel change of the pons and hemispheric white matter. 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 Left mastoid effusion.   DG CHEST PORT 1 VIEW 07/20/2019 IMPRESSION:  COPD.  No active disease.   DG Chest Port 1 View 07/19/2019 IMPRESSION:   Stable exam, no acute process.   ECHOCARDIOGRAM COMPLETE 07/20/2019 IMPRESSIONS   1. Left ventricular ejection fraction, by estimation, is 60 to 65%. The left ventricle has normal function. The left ventricle has no regional wall motion abnormalities. Left ventricular diastolic parameters are consistent with Grade I diastolic dysfunction (impaired relaxation). Elevated left atrial pressure.   2. Right ventricular systolic function is normal. The right ventricular size is normal. There is normal pulmonary artery systolic pressure. The estimated right ventricular systolic pressure is 00.7 mmHg.   3. The mitral valve is normal in structure. Mild mitral valve regurgitation. No evidence of mitral stenosis.   4. Tricuspid valve regurgitation is moderate.   5. The aortic valve is normal in structure. Aortic valve regurgitation is trivial. Mild aortic valve sclerosis is present, with no evidence of aortic valve stenosis.   6. The inferior vena cava is normal in size with greater than 50% respiratory variability, suggesting right atrial pressure of 3 mmHg.   VAS US CAROTID (at [facility] and [facility] only) 07/20/2019 Summary:  Right Carotid: Velocities in the right ICA are consistent with a 40-59% stenosis.  Left Carotid: Velocities in the left ICA are consistent with a 1-39% stenosis.  Subclavians: Left subclavian artery was stenotic.   ECG - SR rate 60 BPM. (See cardiology reading for complete details)   PHYSICAL EXAM  Temp:  [95.3 F (35.2 C)-98.1 F (36.7 C)] 98.1 F (36.7 C) (06/18 1609) Pulse Rate:  [43-61] 56 (06/18 1609) Resp:  [13-20] 18 (06/18 1609) BP: (94-179)/(53-112) 168/53 (06/18 1609) SpO2:  [94 %-99 %] 99 % (06/18 1609)  General - Well nourished, well developed, in no apparent distress, but lethargic.  Ophthalmologic - fundi not visualized due to noncooperation.  Cardiovascular - Regular rhythm and rate.  Mental Status -  Level of arousal and orientation to time, place, and person  were intact. Language including expression, naming, repetition, comprehension was assessed and found intact. Mild to moderate dysarthria  Cranial Nerves II - XII - II - Visual field intact OU. III, IV, VI - Extraocular movements intact. V - Facial sensation intact bilaterally. VII - right mild facial droop. VIII - Hearing & vestibular intact bilaterally. X - Palate elevates symmetrically. XI - Chin turning & shoulder shrug intact bilaterally. XII - Tongue protrusion intact.  Motor Strength - The patient's strength was normal in LUE and LLE, however, RUE 4/5 with pronator drift was absent and RLE 4/5 with drift.  Bulk was normal and fasciculations were absent.   Motor Tone - Muscle tone was assessed at the neck and appendages and was normal.  Reflexes - The patient's reflexes were symmetrical in all  extremities and she had no pathological reflexes.  Sensory - Light touch, temperature/pinprick were assessed and were symmetrical but bilateral decreased light touch sensation BLEs.    Coordination - The patient had normal movements in the left hand with no ataxia or dysmetria. However, right FTN slow with mild dysmetria but still proportional to the weakness.  Tremor was absent.  Gait and Station - deferred.   ASSESSMENT/PLAN [name] is a 86 y.o. female with history of lumbar laminectomy (has ambulatory difficulty at baseline), COPD, ongoing tobacco use, carotid atherosclerotic disease, CAD, seen 6/15 for chest pain, coronary stenting, HLD, HTN, and PVD who presented to the ED today via EMS with continued LLE weakness and difficulty with speech intermittently. . She did not receive IV t-PA due to late presentation (>4.5 hours from time of onset)  Stroke - Left MCA subcortical infarct, likely due to high grade stenosis of left M1   CT Head - Stable cortical encephalomalacia in the left occipital pole.      MRI head - No acute brain finding. Old left occipital cortical and  subcortical infarction. Left mastoid effusio  MRA head - 6 mm short segment severe stenosis involving the mid-distal left M1 segment. Distal left MCA branches are perfused, although attenuated as compared to the contralateral right MCA branches. Nonvisualization of the left vertebral artery as it courses into the cranial vault, which may be partially occluded. Retrograde filling of the left V4 segment and left PICA across the vertebrobasilar junction. Atheromatous change throughout the PCAs bilaterally with associated moderate bilateral P2 stenoses.   CTA H&N - Compensated severe Left MCA M1 stenosis. Other intracranial CTA findings concordant to the MRA yesterday including: - Severe Left Vertebral V4 segment stenosis. - Moderate to severe bilateral PCA stenosis worse on the left. Extracranial calcified plaque resulting in bilateral carotid bifurcation stenosis affecting the ICAs: 60% on the Right and 55-60% on the Left .   MRI repeat pending  EEG - no seizure  Carotid Doppler - Right ICA - 40-59% stenosis.    2D Echo - EF 60 - 65%. No cardiac source of emboli identified.   Sars Corona Virus 2 - negative  LDL - 120  HgbA1c pending  UDS - negative  VTE prophylaxis - [place] Heparin  aspirin 81 mg daily prior to admission, now on aspirin 325 mg daily and clopidogrel 75 mg daily.  Recommend DAPT for 3 months and then Plavix alone given left MCA high-grade stenosis  Patient counseled to be compliant with her antithrombotic medications  Ongoing aggressive stroke risk factor management  Therapy recommendations:  CIR vs. SNF  Disposition:  Pending  Multivessel intracranial stenosis  Result of multiple risk factors  CT head and neck and MRA head - severe Left Vertebral V4 segment stenosis. - Moderate to severe bilateral PCA stenosis worse on the left. bilateral carotid bifurcation stenosis affecting the ICAs: 60% on the Right and 55-60% on the Left .   Carotid Doppler right ICA 40 to 59%  stenosis  Long-term BP goal 130-150   Hypertension  Home BP meds: Norvasc ; Toprol ; Imdur  Current BP meds: none   Stable . Permissive hypertension (OK if < 220/120) but gradually normalize in 2-3 days  . Long-term BP goal 130-150 given multivessel intracranial stenosis  Hyperlipidemia  Home Lipid lowering medication: none   LDL 120, goal < 70  Current lipid lowering medication: Zetia (statin intolerance)  Continue Zetia at discharge  Tobacco abuse  Current longtime smoker  Smoking cessation counseling provided  Pt is willing to quit  Other Stroke Risk Factors  Advanced age  Coronary artery disease  History of stroke by imaging - left occipital/PCA territory, patient and family denies history of stroke  Other Active Problems  Code status - Full code  Aortic Atherosclerosis (ICD10-I70.0)   Emphysema (ICD10-J43.9)  hospital day # 0  Rosalin Hawking, MD PhD Stroke Neurology 07/20/2019 6:41 PM   To contact Stroke Continuity provider, please refer to [email or web address] After hours, contact General Neurology

## 2019-07-20 NOTE — Progress Notes (Signed)
SLP Cancellation Note  Patient Details Name: [name] [name] MRN: [id number] DOB: 10/07/1927   Cancelled treatment:       Reason Eval/Treat Not Completed: Patient at procedure or test/unavailable.  Attempted to see pt for swallow evaluation but pt preparing to undergo EEG.  Will continue to follow up as pt is available.     Codi Kertz, Selinda Orion 07/20/2019, 2:38 PM

## 2019-07-20 NOTE — Plan of Care (Signed)

## 2019-07-20 NOTE — Evaluation (Signed)
Clinical/Bedside Swallow Evaluation Patient Details  Name: [name] [name] MRN: [id number] Date of Birth: 1927-11-26  Today's Date: 07/20/2019 Time: SLP Start Time (ACUTE ONLY): 29 SLP Stop Time (ACUTE ONLY): 1548 SLP Time Calculation (min) (ACUTE ONLY): 18 min  Past Medical History:  Past Medical History:  Diagnosis Date  . CARCINOMA, BASAL CELL 02/13/2006  . CAROTID ARTERY DISEASE 10/03/2006   Carotid US 11/17: Stable 40-59% bilateral ICA stenosis >> f/u 1 year  . CHRONIC OBSTRUCTIVE PULMONARY DISEASE, ACUTE EXACERBATION 11/17/2009  . Chronic rhinitis 12/13/2006  . CORONARY ARTERY DISEASE 02/13/2006  . DEGENERATIVE JOINT DISEASE 09/27/2006  . DERMATITIS 09/25/2007  . Head trauma    closed  . HYPERLIPIDEMIA 03/10/2008  . HYPERTENSION 02/13/2006  . HYPOTHYROIDISM 01/30/2007  . LIVER FUNCTION TESTS, ABNORMAL 01/22/2009  . LOSS, HEARING NOS 08/25/2006  . Near syncope 09/18/2014  . NEOPLASM, SKIN, UNCERTAIN BEHAVIOR 50/10/3816  . NEPHROLITHIASIS, HX OF 02/27/2010  . OSTEOARTHRITIS, HAND 12/16/2008  . PERIPHERAL VASCULAR DISEASE 02/13/2006  . Personal history of malignant neoplasm of breast 07/24/2008  . Personal history of urinary calculi   . Post-traumatic wound infection 12/01/2010   Mild streaking superiorly and on one day of septra  Will give rocephin and add keflex  adn follow up   . POSTHERPETIC NEURALGIA 08/25/2006  . RENAL ARTERY STENOSIS 08/01/2008  . Shingles    recurrent  . SPINAL STENOSIS 11/01/2006  . THROMBOCYTOPENIA 07/30/2008  . UNS ADVRS EFF UNS RX MEDICINAL&BIOLOGICAL SBSTNC 01/30/2007  . UNSPECIFIED ANEMIA 03/18/2008  . Unspecified vitamin D deficiency 02/07/2007  . UNSTABLE ANGINA 12/12/2009  . Vitreous detachment    Past Surgical History:  Past Surgical History:  Procedure Laterality Date  . ABDOMINAL HYSTERECTOMY    . BREAST LUMPECTOMY    . BREAST LUMPECTOMY  1998  . CATARACT EXTRACTION    . CORONARY ANGIOPLASTY WITH STENT PLACEMENT     bilat iliac  stents, left renal artery and rt coronary artery last myoview 8/09 with EF 70%  . LUMBAR LAMINECTOMY    . SKIN BIOPSY  08/24/2017   Invasive squamous cell carcinoma  . SKIN CANCER EXCISION    . TONSILLECTOMY     HPI:  [name] [name] is a 86 y.o. female who presents to the ED for evaluation of left-sided weakness and intermittent expressive aphasia.     Assessment / Plan / Recommendation Clinical Impression   Pt presents with initial cough following straw sip of thin liquids.  No further coughing was noted with consecutive trials.  Oral phase was slightly prolonged due to xerostomia but pt was able to clear solids from the oral cavity post swallow with increased time.  Pt has a cough outside of PO intake that sounds congested but pt's granddaughter reports this to be baseline. As a result, I recommend that pt resume a diet of regular textures and thin liquids.  Pt may need intermittent supervision during meals due to difficulty self feeding resulting from ?proprioceptive deficits (difficulty judging distances and undershooting hand to mouth).   SLP Visit Diagnosis: Dysphagia, unspecified (R13.10)    Aspiration Risk  Mild aspiration risk    Diet Recommendation Regular;Thin liquid   Liquid Administration via: Cup;Straw Medication Administration: Whole meds with liquid Supervision: Patient able to self feed;Intermittent supervision to cue for compensatory strategies Compensations: Slow rate;Small sips/bites Postural Changes: Seated upright at 90 degrees;Remain upright for at least 30 minutes after po intake    Other  Recommendations Oral Care Recommendations: Oral care BID   Follow up  Recommendations Other (comment) (To be determined pending cognitive-linguistic evaluation)      Frequency and Duration min 2x/week          Prognosis Prognosis for Safe Diet Advancement: Good      Swallow Study   General HPI: [name] [name] is a 86 y.o. female who presents to the ED  for evaluation of left-sided weakness and intermittent expressive aphasia.   Type of Study: Bedside Swallow Evaluation Previous Swallow Assessment: 02/04/2017 Diet Prior to this Study: NPO Temperature Spikes Noted: No Respiratory Status: Room air History of Recent Intubation: No Behavior/Cognition: Alert;Cooperative;Pleasant mood Oral Cavity Assessment: Within Functional Limits Oral Care Completed by SLP: No Oral Cavity - Dentition: Adequate natural dentition Vision: Functional for self-feeding (impaired proprioception) Self-Feeding Abilities: Able to feed self;Needs assist Patient Positioning: Upright in bed Baseline Vocal Quality: Low vocal intensity;Hoarse Volitional Cough: Congested Volitional Swallow: Able to elicit    Oral/Motor/Sensory Function Overall Oral Motor/Sensory Function: Within functional limits   Ice Chips     Thin Liquid Thin Liquid: Impaired Presentation: Straw Pharyngeal  Phase Impairments: Cough - Immediate Other Comments: with initial sip     Nectar Thick     Honey Thick     Puree Puree: Within functional limits   Solid     Solid: Within functional limits      Emilio Math 07/20/2019,4:02 PM

## 2019-07-20 NOTE — Progress Notes (Signed)
  Echocardiogram 2D Echocardiogram has been performed.  [name] [name] 07/20/2019, 9:08 AM

## 2019-07-20 NOTE — Progress Notes (Signed)
Occupational Therapy Evaluation Patient Details Name: [name] [name] MRN: [id number] DOB: 1927/11/04 Today's Date: 07/20/2019    History of Present Illness Pt is an 86 y/o female who presents with weakness and aphasia. CT and MRI reveal no acute intracranial abnormalities, however CTA   Clinical Impression   Patient lives at home alone at prior level and is independent with devices.  Today patient presenting with R side weakness and poor coordination causing difficulty with ADLs.  Patient requiring min assist with UB ADLs and max with LB.  Patient reports some sensation changes in L UE, though unable to determine exact impairment.  Patient also presenting with aphasia and poor initiation, processing, and command following.  Noted slight R peripheral visual field loss compared to L, though patient reports no visual change. Today required max assist x2 and RW with stand pivot transfer.  Patient would benefit from CIR.  Will continue to follow with OT acutely to address the deficits listed below.      Follow Up Recommendations  SNF;Supervision/Assistance - 24 hour (CIR if patient becomes inpatient)    Equipment Recommendations  Other (comment) (defer to next venue)    Recommendations for Other Services       Precautions / Restrictions Precautions Precautions: Fall Restrictions Weight Bearing Restrictions: No      Mobility Bed Mobility Overal bed mobility: Needs Assistance Bed Mobility: Supine to Sit     Supine to sit: Min assist     General bed mobility comments: Pt reaching for R railing with L hand to scoot towards EOB. Pt able to slowly advance LE's off EOB however required assist to elevate trunk to full sitting position.   Transfers Overall transfer level: Needs assistance Equipment used: Rolling walker (2 wheeled);2 person hand held assist Transfers: Sit to/from [facility] Sit to Stand: Mod assist;+2 physical assistance Stand pivot transfers:  Max assist;+2 physical assistance       General transfer comment: Pt required assistance to facilitate weight shift and advance feet.     Balance Overall balance assessment: Needs assistance Sitting-balance support: Feet supported;Single extremity supported Sitting balance-Leahy Scale: Fair     Standing balance support: Bilateral upper extremity supported;During functional activity Standing balance-Leahy Scale: Poor                             ADL either performed or assessed with clinical judgement   ADL Overall ADL's : Needs assistance/impaired Eating/Feeding: Minimal assistance   Grooming: Minimal assistance;Sitting   Upper Body Bathing: Minimal assistance;Sitting   Lower Body Bathing: Maximal assistance;Sitting/lateral leans   Upper Body Dressing : Minimal assistance;Sitting   Lower Body Dressing: Maximal assistance;Sitting/lateral leans   Toilet Transfer: Maximal assistance;+2 for physical assistance;Stand-pivot;BSC           Functional mobility during ADLs: Maximal assistance;+2 for physical assistance       Vision Baseline Vision/History: Wears glasses Wears Glasses: At all times Patient Visual Report: No change from baseline Vision Assessment?: Vision impaired- to be further tested in functional context Additional Comments: Patient reports no visual change, though noted slight R peripheral field loss compared to L.     Perception     Praxis      Pertinent Vitals/Pain Pain Assessment: Faces Faces Pain Scale: No hurt     Hand Dominance Right   Extremity/Trunk Assessment Upper Extremity Assessment Upper Extremity Assessment: RUE deficits/detail;LUE deficits/detail RUE Deficits / Details: Poor coordination, moves much slower than left.  Drift present.  Shoulder AROM to ~100.  Gross strength 2/5. States sensation in this arm ahs always been a little "weird". RUE Sensation: decreased light touch RUE Coordination: decreased fine  motor;decreased gross motor LUE Deficits / Details: Gross strength 3+/5.  Reports sensation impairement (though difficult to tell if patient was having difficulty comprehending therapist's questions/direction). LUE Coordination: decreased gross motor   Lower Extremity Assessment Lower Extremity Assessment: Defer to PT evaluation    Cervical / Trunk Assessment Cervical / Trunk Assessment: Kyphotic;Other exceptions Cervical / Trunk Exceptions: Forward head posture with rounded shoulders   Communication Communication Communication: Expressive difficulties   Cognition Arousal/Alertness: Awake/alert Behavior During Therapy: WFL for tasks assessed/performed Overall Cognitive Status: No family/caregiver present to determine baseline cognitive functioning                                 General Comments: Patient processing slowly, following simple commands inconsistently.  Patient tearful on and off throughout session.   General Comments       Exercises     Shoulder Instructions      Home Living Family/patient expects to be discharged to:: Private residence Living Arrangements: Alone Available Help at Discharge: Family;Available 24 hours/day;Other (Comment) ("almost 24 hours/day") Type of Home: House       Home Layout: One level     Bathroom Shower/Tub: Tub/shower unit         Home Equipment: Environmental consultant - 2 wheels;Cane - single point;Tub bench          Prior Functioning/Environment Level of Independence: Independent with assistive device(s)        Comments: Pt reports she uses a cane to walk down to the mailbox        OT Problem List: Decreased strength;Decreased range of motion;Decreased activity tolerance;Impaired balance (sitting and/or standing);Impaired vision/perception;Decreased coordination;Decreased cognition;Decreased safety awareness;Impaired sensation;Impaired UE functional use      OT Treatment/Interventions: Self-care/ADL  training;Therapeutic exercise;Neuromuscular education;Energy conservation;Therapeutic activities;Cognitive remediation/compensation;Patient/family education;Visual/perceptual remediation/compensation;Balance training    OT Goals(Current goals can be found in the care plan section) Acute Rehab OT Goals Patient Stated Goal: Get back to talking normal OT Goal Formulation: With patient Time For Goal Achievement: 08/03/19 Potential to Achieve Goals: Good  OT Frequency: Min 2X/week   Barriers to D/C: Decreased caregiver support          Co-evaluation PT/OT/SLP Co-Evaluation/Treatment: Yes Reason for Co-Treatment: Necessary to address cognition/behavior during functional activity;To address functional/ADL transfers;For patient/therapist safety PT goals addressed during session: Mobility/safety with mobility;Balance;Proper use of DME OT goals addressed during session: ADL's and self-care;Strengthening/ROM      AM-PAC OT "6 Clicks" Daily Activity     Outcome Measure Help from another person eating meals?: A Little Help from another person taking care of personal grooming?: A Little Help from another person toileting, which includes using toliet, bedpan, or urinal?: A Lot Help from another person bathing (including washing, rinsing, drying)?: A Lot Help from another person to put on and taking off regular upper body clothing?: A Little Help from another person to put on and taking off regular lower body clothing?: A Lot 6 Click Score: 15   End of Session Equipment Utilized During Treatment: Rolling walker;Gait belt Nurse Communication: Mobility status;Need for lift equipment (stedy)  Activity Tolerance: Patient tolerated treatment well Patient left: in chair;with call bell/phone within reach;with chair alarm set  OT Visit Diagnosis: Unsteadiness on feet (R26.81);Other abnormalities of gait and mobility (R26.89);Muscle weakness (  generalized) (M62.81);Other symptoms and signs involving the  nervous system (R29.898);Other symptoms and signs involving cognitive function                Time: 7017-7939 OT Time Calculation (min): 43 min Charges:  OT General Charges $OT Visit: 1 Visit OT Evaluation $OT Eval Moderate Complexity: 1 [address] OTR/L   Phylliss Bob 07/20/2019, 2:07 PM

## 2019-07-20 NOTE — Progress Notes (Signed)
Progress Note    [name] [name]  TGG:[id number] DOB: 1927-10-25  DOA: 07/19/2019 PCP: Burnis Medin, MD      Brief Narrative:    Medical records reviewed and are as summarized below:  [name] [name] is a 86 y.o. female with medical history significant for CAD s/p stenting to RCA 2005, PVD, COPD, CKD stage III, HTN, HLD, CAS (40-59% stenosis right and left ICAs) who presented to the ED for evaluation of left-sided weakness and intermittent expressive aphasia.    Patient reported intermittent and waxing/waning left-sided weakness and expressive aphasia over the last 2-3 days.  Symptoms would occur and resolve without obvious inciting or improving factors.    Assessment/Plan:   Principal Problem:   Aphasia Active Problems:   Hyperlipidemia   Essential hypertension, benign   Peripheral vascular disease (HCC)   COPD (chronic obstructive pulmonary disease) (HCC)   Left-sided weakness   Hypokalemia   CAD (coronary artery disease), native coronary artery   Left lower extremity weakness and intermittent expressive aphasia, right sided weakness Bilateral carotid artery stenosis   MRI brain is negative for acute or subacute infarct.  CTA head and neck showed:  1. compensated Severe Left MCA M1 stenosis. CTP is negative for infarct core or penumbra. And CT appearance of the brain is stable.  2. Other intracranial CTA findings concordant to the MRA yesterday including: - Severe Left Vertebral V4 segment stenosis. - Moderate to severe bilateral PCA stenosis worse on the left.  3. Extracranial calcified plaque resulting in bilateral carotid bifurcation stenosis affecting the ICAs: 60% on the Right and 55-60% on the Left .      Follow-up with neurologist for further recommendation.  -Echocardiogram showed EF estimated laxity 70%, grade 1 diastolic dysfunction, moderate TR -Monitor on telemetry, and continue neurochecks -PT/OT/SLP eval Lipid panel showed  LDL of 120, cholesterol 185, HDL 51 Hemoglobin A1c is pending -Continue aspirin and Plavix -Patient intolerant to statins -Continue IV fluids.  Patient is n.p.o. pending speech therapist's recommendation.   CAD s/p stenting to RCA PVD with occluded left SFA on medical management: Chronic and stable, denies any chest pain.  Continue aspirin 81 mg daily.  Intolerant to statins.  Hypokalemia: Replete orally and recheck in a.m.  Hypertension: Hold Toprol, Imdur and amlodipine to allow for permissive hypertension.  COPD: Chronic and appears stable.  Continue Flovent, Spiriva, and as needed Xopenex.  Hyperlipidemia: Intolerant to statins, preferred dietary change rather than alternate agents per prior cardiology documentation.  CKD stage III: Chronic and appears stable.  Continue to monitor.  Body mass index is 17.16 kg/m.   Family Communication/Anticipated D/C date and plan/Code Status   DVT prophylaxis: heparin injection 5,000 Units Start: 07/19/19 2245   Code Status: Full code Family Communication: Plan discussed with patient Disposition Plan:    Status is: Observation  The patient will require care spanning > 2 midnights and should be moved to inpatient because: Inpatient level of care appropriate due to severity of illness  Dispo: The patient is from: Home              Anticipated d/c is to: SNF              Anticipated d/c date is: 2 days              Patient currently is not medically stable to d/c.           Subjective:   No complaints.  No numbness or  weakness in the extremities.  Objective:    Vitals:   07/20/19 0556 07/20/19 0558 07/20/19 0726 07/20/19 1210  BP: (!) 137/112  (!) 155/63 (!) 151/63  Pulse: (!) 43 (!) 54 (!) 54 (!) 53  Resp: 16  16 16   Temp: (!) 95.4 F (35.2 C)  97.7 F (36.5 C) (!) 97.5 F (36.4 C)  TempSrc: Rectal  Oral Oral  SpO2: 99%  94% 98%  Weight:      Height:       No data found.   Intake/Output  Summary (Last 24 hours) at 07/20/2019 1313 Last data filed at 07/20/2019 0307 Gross per 24 hour  Intake 700 ml  Output --  Net 700 ml   Filed Weights   07/19/19 1743  Weight: 45.4 kg    Exam:  GEN: NAD SKIN: No rash EYES: EOMI ENT: MMM CV: RRR PULM: CTA B ABD: soft, ND, NT, +BS CNS: AAO x 3, dysarthria, power is 4/5 in right upper and lower extremities.  Power is 4+/5 left upper and lower extremities. EXT: No edema or tenderness   Data Reviewed:   I have personally reviewed following labs and imaging studies:  Labs: Labs show the following:   Basic Metabolic Panel: Recent Labs  Lab 07/17/19 1657 07/17/19 1657 07/19/19 1755 07/19/19 1755 07/19/19 1825 07/20/19 0407  NA 143  --  144  --  145 139  K 3.5   < > 3.4*   < > 3.4* 3.6  CL 105  --  108  --  105 104  CO2 30  --  26  --   --  26  GLUCOSE 87  --  90  --  88 114*  BUN 20  --  25*  --  30* 17  CREATININE 1.20*  --  1.23*  --  1.30* 0.90  CALCIUM 9.6  --  9.4  --   --  8.9  MG  --   --   --   --   --  1.7  PHOS  --   --   --   --   --  2.2*   < > = values in this interval not displayed.   GFR Estimated Creatinine Clearance: 30.4 mL/min (by C-G formula based on SCr of 0.9 mg/dL). Liver Function Tests: Recent Labs  Lab 07/19/19 1755  AST 20  ALT 12  ALKPHOS 77  BILITOT 0.6  PROT 6.0*  ALBUMIN 3.3*   No results for input(s): LIPASE, AMYLASE in the last 168 hours. No results for input(s): AMMONIA in the last 168 hours. Coagulation profile Recent Labs  Lab 07/19/19 1755  INR 1.0    CBC: Recent Labs  Lab 07/17/19 1657 07/19/19 1825 07/19/19 1918 07/20/19 0407  WBC 7.3  --  7.5 8.3  NEUTROABS  --   --  5.2  --   HGB 14.4 13.6 13.4 14.6  HCT 45.7 40.0 42.8 45.9  MCV 94.0  --  94.7 93.7  PLT 206  --  178 187   Cardiac Enzymes: No results for input(s): CKTOTAL, CKMB, CKMBINDEX, TROPONINI in the last 168 hours. BNP (last 3 results) No results for input(s): PROBNP in the last 8760  hours. CBG: No results for input(s): GLUCAP in the last 168 hours. D-Dimer: No results for input(s): DDIMER in the last 72 hours. Hgb A1c: No results for input(s): HGBA1C in the last 72 hours. Lipid Profile: Recent Labs    07/20/19 0407  CHOL 185  HDL  45  LDLCALC 120*  TRIG 70  CHOLHDL 3.6   Thyroid function studies: No results for input(s): TSH, T4TOTAL, T3FREE, THYROIDAB in the last 72 hours.  Invalid input(s): FREET3 Anemia work up: No results for input(s): VITAMINB12, FOLATE, FERRITIN, TIBC, IRON, RETICCTPCT in the last 72 hours. Sepsis Labs: Recent Labs  Lab 07/17/19 1657 07/19/19 1918 07/20/19 0407  WBC 7.3 7.5 8.3    Microbiology Recent Results (from the past 240 hour(s))  SARS Coronavirus 2 by RT PCR (hospital order, performed in [facility] hospital lab) Nasopharyngeal Nasopharyngeal Swab     Status: None   Collection Time: 07/19/19 10:53 PM   Specimen: Nasopharyngeal Swab  Result Value Ref Range Status   SARS Coronavirus 2 NEGATIVE NEGATIVE Final    Comment: (NOTE) SARS-CoV-2 target nucleic acids are NOT DETECTED.  The SARS-CoV-2 RNA is generally detectable in upper and lower respiratory specimens during the acute phase of infection. The lowest concentration of SARS-CoV-2 viral copies this assay can detect is 250 copies / mL. A negative result does not preclude SARS-CoV-2 infection and should not be used as the sole basis for treatment or other patient management decisions.  A negative result may occur with improper specimen collection / handling, submission of specimen other than nasopharyngeal swab, presence of viral mutation(s) within the areas targeted by this assay, and inadequate number of viral copies (<250 copies / mL). A negative result must be combined with clinical observations, patient history, and epidemiological information.  Fact Sheet for Patients:   StrictlyIdeas.no  Fact Sheet for Healthcare  Providers: BankingDealers.co.za  This test is not yet approved or  cleared by the [place] FDA and has been authorized for detection and/or diagnosis of SARS-CoV-2 by FDA under an Emergency Use Authorization (EUA).  This EUA will remain in effect (meaning this test can be used) for the duration of the COVID-19 declaration under Section 564(b)(1) of the Act, 21 U.S.C. section 360bbb-3(b)(1), unless the authorization is terminated or revoked sooner.  Performed at [facility], [place] [address], [place], [place] [address]     Procedures and diagnostic studies:  CT Code Stroke CTA Head W/WO contrast  Result Date: 07/20/2019 CLINICAL DATA:  86 year old female with neurologic deficit. Unrevealing brain MRI yesterday, but MRA with severe left M1 stenosis. EXAM: CT ANGIOGRAPHY HEAD AND NECK CT PERFUSION BRAIN TECHNIQUE: Multidetector CT imaging of the head and neck was performed using the standard protocol during bolus administration of intravenous contrast. Multiplanar CT image reconstructions and MIPs were obtained to evaluate the vascular anatomy. Carotid stenosis measurements (when applicable) are obtained utilizing NASCET criteria, using the distal internal carotid diameter as the denominator. Multiphase CT imaging of the brain was performed following IV bolus contrast injection. Subsequent parametric perfusion maps were calculated using RAPID software. CONTRAST:  71mL OMNIPAQUE IOHEXOL 350 MG/ML SOLN COMPARISON:  Brain MRI and intracranial MRA 07/19/2019. FINDINGS: CT Brain Perfusion Findings: ASPECTS: 10 CBF (<30%) Volume: None Perfusion (Tmax>6.0s) volume: None, and no significant T-max asymmetry from the left to right hemisphere. Mismatch Volume: Not applicable Infarction Location:Not applicable CT HEAD Brain: Calcified atherosclerosis at the skull base. Stable cortical encephalomalacia in the left occipital pole. Patchy bilateral white matter hypodensity is stable.  Dystrophic basal ganglia calcifications again noted. No midline shift, ventriculomegaly, mass effect, evidence of mass lesion, intracranial hemorrhage or evidence of cortically based acute infarction. Calvarium and skull base: Mild chronic right orbital floor fracture. No acute osseous abnormality identified. Paranasal sinuses: Sinuses and mastoids are largely clear today.  Orbits: No acute orbit or scalp soft tissue finding. CTA NECK Skeleton: Advanced degenerative changes throughout the cervical spine. No acute osseous abnormality identified. Upper chest: Centrilobular emphysema. Mild apical lung scarring. No superior mediastinal lymphadenopathy. Sequelae of right axillary node dissection. Other neck: No acute findings. Aortic arch: Fairly bulky soft and calcified plaque throughout the aortic arch. Three vessel arch configuration. Right carotid system: Brachiocephalic artery and right CCA origin plaque without stenosis. Intermittent plaque in the right CCA without stenosis proximal to the bifurcation. Bulky calcified plaque at the carotid bifurcation and continuing into the proximal right ICA resulting in up to 60 % stenosis with respect to the distal vessel (series 7, image 103). Distal to the bulb no other right ICA plaque to the skull base. Left carotid system: Left CCA origin and other plaque without stenosis proximal to the bifurcation. Bulky calcified plaque at the left ICA origin and bulb resulting in 55-60 % stenosis with respect to the distal vessel (series 10, image 101). Mildly tortuous left ICA. Vertebral arteries: Proximal right subclavian plaque without stenosis. The right vertebral artery origin appear spared. Mild right V2 segment plaque, no right vertebral stenosis to the skull base. Proximal left subclavian artery soft and calcified plaque is extensive although less than 50 % stenosis with respect to the distal vessel results. Mild stenosis at the left vertebral artery origin. Additional mild soft  plaque in the left V1 segment. Patent and slightly non dominant left vertebral artery to the skull base without significant stenosis. CTA HEAD Posterior circulation: Bulky and circumferential calcified plaque of the left V4 segment with high-grade stenosis on series 9, image 145. This is concordant with the MRA yesterday. Mild right V4 calcified plaque without significant stenosis. The left vertebral and left PICA remain patent despite the high-grade stenosis. There is moderate stenosis again at the left vertebrobasilar junction. Patent basilar artery with mild irregularity and no significant stenosis. Patent SCA and PCA origins. Posterior communicating arteries are diminutive or absent. Severe left PCA stenosis from the P2 segment on word. Moderate to severe right P2 stenosis with better distal right PCA enhancement. This is concordant with the MRA. Anterior circulation: Both ICA siphons are patent. Calcified plaque on the left with only mild supraclinoid segment stenosis. Right side calcified plaque and tortuosity with mild supraclinoid stenosis. Patent bilateral carotid termini. Patent MCA and ACA origins. Anterior communicating artery, bilateral ACA branches, right MCA M1, and right MCA bifurcation are patent without stenosis. Mild to moderate right M2 and M3 branch irregularities and stenosis appear similar to the MRA. Left MCA M1 demonstrates a relatively long 6 mm segment of moderate to severe stenosis best seen on series 12, image 20. This leads up to the left MCA trifurcation which remains patent. Left MCA branches demonstrate fairly symmetric enhancement to those on the right, and only mild additional irregularity. Venous sinuses: Early contrast timing, not evaluated. Anatomic variants: Mildly dominant right vertebral artery. Review of the MIP images confirms the above findings IMPRESSION: 1. Compensated Severe Left MCA M1 stenosis. CTP is negative for infarct core or penumbra. And CT appearance of the  brain is stable. 2. Other intracranial CTA findings concordant to the MRA yesterday including: - Severe Left Vertebral V4 segment stenosis. - Moderate to severe bilateral PCA stenosis worse on the left. 3. Extracranial calcified plaque resulting in bilateral carotid bifurcation stenosis affecting the ICAs: 60% on the Right and 55-60% on the Left . 4. No significant cervical vertebral artery stenosis. 5. Aortic Atherosclerosis (ICD10-I70.0) and  Emphysema (ICD10-J43.9). Electronically Signed   By: Genevie Ann M.D.   On: 07/20/2019 04:20   CT Code Stroke CTA Neck W/WO contrast  Result Date: 07/20/2019 CLINICAL DATA:  86 year old female with neurologic deficit. Unrevealing brain MRI yesterday, but MRA with severe left M1 stenosis. EXAM: CT ANGIOGRAPHY HEAD AND NECK CT PERFUSION BRAIN TECHNIQUE: Multidetector CT imaging of the head and neck was performed using the standard protocol during bolus administration of intravenous contrast. Multiplanar CT image reconstructions and MIPs were obtained to evaluate the vascular anatomy. Carotid stenosis measurements (when applicable) are obtained utilizing NASCET criteria, using the distal internal carotid diameter as the denominator. Multiphase CT imaging of the brain was performed following IV bolus contrast injection. Subsequent parametric perfusion maps were calculated using RAPID software. CONTRAST:  36mL OMNIPAQUE IOHEXOL 350 MG/ML SOLN COMPARISON:  Brain MRI and intracranial MRA 07/19/2019. FINDINGS: CT Brain Perfusion Findings: ASPECTS: 10 CBF (<30%) Volume: None Perfusion (Tmax>6.0s) volume: None, and no significant T-max asymmetry from the left to right hemisphere. Mismatch Volume: Not applicable Infarction Location:Not applicable CT HEAD Brain: Calcified atherosclerosis at the skull base. Stable cortical encephalomalacia in the left occipital pole. Patchy bilateral white matter hypodensity is stable. Dystrophic basal ganglia calcifications again noted. No midline shift,  ventriculomegaly, mass effect, evidence of mass lesion, intracranial hemorrhage or evidence of cortically based acute infarction. Calvarium and skull base: Mild chronic right orbital floor fracture. No acute osseous abnormality identified. Paranasal sinuses: Sinuses and mastoids are largely clear today. Orbits: No acute orbit or scalp soft tissue finding. CTA NECK Skeleton: Advanced degenerative changes throughout the cervical spine. No acute osseous abnormality identified. Upper chest: Centrilobular emphysema. Mild apical lung scarring. No superior mediastinal lymphadenopathy. Sequelae of right axillary node dissection. Other neck: No acute findings. Aortic arch: Fairly bulky soft and calcified plaque throughout the aortic arch. Three vessel arch configuration. Right carotid system: Brachiocephalic artery and right CCA origin plaque without stenosis. Intermittent plaque in the right CCA without stenosis proximal to the bifurcation. Bulky calcified plaque at the carotid bifurcation and continuing into the proximal right ICA resulting in up to 60 % stenosis with respect to the distal vessel (series 7, image 103). Distal to the bulb no other right ICA plaque to the skull base. Left carotid system: Left CCA origin and other plaque without stenosis proximal to the bifurcation. Bulky calcified plaque at the left ICA origin and bulb resulting in 55-60 % stenosis with respect to the distal vessel (series 10, image 101). Mildly tortuous left ICA. Vertebral arteries: Proximal right subclavian plaque without stenosis. The right vertebral artery origin appear spared. Mild right V2 segment plaque, no right vertebral stenosis to the skull base. Proximal left subclavian artery soft and calcified plaque is extensive although less than 50 % stenosis with respect to the distal vessel results. Mild stenosis at the left vertebral artery origin. Additional mild soft plaque in the left V1 segment. Patent and slightly non dominant left  vertebral artery to the skull base without significant stenosis. CTA HEAD Posterior circulation: Bulky and circumferential calcified plaque of the left V4 segment with high-grade stenosis on series 9, image 145. This is concordant with the MRA yesterday. Mild right V4 calcified plaque without significant stenosis. The left vertebral and left PICA remain patent despite the high-grade stenosis. There is moderate stenosis again at the left vertebrobasilar junction. Patent basilar artery with mild irregularity and no significant stenosis. Patent SCA and PCA origins. Posterior communicating arteries are diminutive or absent. Severe left PCA stenosis from  the P2 segment on word. Moderate to severe right P2 stenosis with better distal right PCA enhancement. This is concordant with the MRA. Anterior circulation: Both ICA siphons are patent. Calcified plaque on the left with only mild supraclinoid segment stenosis. Right side calcified plaque and tortuosity with mild supraclinoid stenosis. Patent bilateral carotid termini. Patent MCA and ACA origins. Anterior communicating artery, bilateral ACA branches, right MCA M1, and right MCA bifurcation are patent without stenosis. Mild to moderate right M2 and M3 branch irregularities and stenosis appear similar to the MRA. Left MCA M1 demonstrates a relatively long 6 mm segment of moderate to severe stenosis best seen on series 12, image 20. This leads up to the left MCA trifurcation which remains patent. Left MCA branches demonstrate fairly symmetric enhancement to those on the right, and only mild additional irregularity. Venous sinuses: Early contrast timing, not evaluated. Anatomic variants: Mildly dominant right vertebral artery. Review of the MIP images confirms the above findings IMPRESSION: 1. Compensated Severe Left MCA M1 stenosis. CTP is negative for infarct core or penumbra. And CT appearance of the brain is stable. 2. Other intracranial CTA findings concordant to the  MRA yesterday including: - Severe Left Vertebral V4 segment stenosis. - Moderate to severe bilateral PCA stenosis worse on the left. 3. Extracranial calcified plaque resulting in bilateral carotid bifurcation stenosis affecting the ICAs: 60% on the Right and 55-60% on the Left . 4. No significant cervical vertebral artery stenosis. 5. Aortic Atherosclerosis (ICD10-I70.0) and Emphysema (ICD10-J43.9). Electronically Signed   By: Genevie Ann M.D.   On: 07/20/2019 04:20   MR ANGIO HEAD WO CONTRAST  Result Date: 07/20/2019 CLINICAL DATA:  Follow-up examination for acute stroke. EXAM: MRA HEAD WITHOUT CONTRAST TECHNIQUE: Angiographic images of the Circle of Willis were obtained using MRA technique without intravenous contrast. COMPARISON:  Comparison made with prior brain MRI from earlier the same day. FINDINGS: ANTERIOR CIRCULATION: Examination mildly degraded by motion artifact. Distal cervical segments of both internal carotid arteries are widely patent with symmetric antegrade flow. Petrous, cavernous, and supraclinoid ICAs widely patent without stenosis or other abnormality. A1 segments patent. Normal anterior communicating artery complex. Anterior cerebral arteries patent to their distal aspects without stenosis. Right M1 widely patent. Normal right MCA bifurcation. Distal right MCA branches well perfused. Focal severe stenosis seen involving the mid-distal left M1 segment (series 1045, image 13). Stenosis measures approximately 6 mm in length. Left MCA bifurcation patent distally. Distal left MCA branches are perfused, although mildly attenuated as compared to the contralateral right MCA branches. POSTERIOR CIRCULATION: Right vertebral artery dominant and widely patent to the vertebrobasilar junction. Left vertebral artery not seen as it courses into the cranial vault, and may be partially occluded. Flow related signal is seen within the distal left V4 segment with filling of the left PICA, which could be  retrograde in nature across the vertebrobasilar junction. Right PICA not seen. Basilar widely patent to its distal aspect without stenosis. Superior cerebral arteries patent bilaterally. Both PCAs primarily supplied via the basilar. Atheromatous change with associated moderate bilateral P2 stenoses noted. PCAs remain patent to their distal aspects. No intracranial aneurysm. IMPRESSION: 1. 6 mm short segment severe stenosis involving the mid-distal left M1 segment. Distal left MCA branches are perfused, although attenuated as compared to the contralateral right MCA branches. 2. Nonvisualization of the left vertebral artery as it courses into the cranial vault, which may be partially occluded. Retrograde filling of the left V4 segment and left PICA across the vertebrobasilar junction.  3. Atheromatous change throughout the PCAs bilaterally with associated moderate bilateral P2 stenoses. Electronically Signed   By: Jeannine Boga M.D.   On: 07/20/2019 02:00   MR BRAIN WO CONTRAST  Result Date: 07/19/2019 CLINICAL DATA:  Ataxia. EXAM: MRI HEAD WITHOUT CONTRAST TECHNIQUE: Multiplanar, multiecho pulse sequences of the brain and surrounding structures were obtained without intravenous contrast. COMPARISON:  Head CT 06/08/2016 FINDINGS: Brain: Diffusion imaging does not show any acute or subacute infarction. There chronic small-vessel ischemic changes of the pons. No focal cerebellar stroke. There is an old left occipital cortical and subcortical infarction. There are chronic small-vessel ischemic changes of the cerebral hemispheric white matter. Old small vessel infarction in the right thalamus. No sign of mass lesion, hemorrhage, hydrocephalus or extra-axial collection. Vascular: Major vessels at the base of the brain show flow. Skull and upper cervical spine: 2 foci of signal within the posterior parietal calvarium towards the vertex, most consistent with hemangiomas. Sinuses/Orbits: Clear/normal Other: None  IMPRESSION: No acute brain finding. No abnormality seen to explain acute ataxia. Chronic small-vessel change of the pons and hemispheric white matter. 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 Left mastoid effusion. Electronically Signed   By: Nelson Chimes M.D.   On: 07/19/2019 18:52   CT Code Stroke Cerebral Perfusion with contrast  Result Date: 07/20/2019 CLINICAL DATA:  86 year old female with neurologic deficit. Unrevealing brain MRI yesterday, but MRA with severe left M1 stenosis. EXAM: CT ANGIOGRAPHY HEAD AND NECK CT PERFUSION BRAIN TECHNIQUE: Multidetector CT imaging of the head and neck was performed using the standard protocol during bolus administration of intravenous contrast. Multiplanar CT image reconstructions and MIPs were obtained to evaluate the vascular anatomy. Carotid stenosis measurements (when applicable) are obtained utilizing NASCET criteria, using the distal internal carotid diameter as the denominator. Multiphase CT imaging of the brain was performed following IV bolus contrast injection. Subsequent parametric perfusion maps were calculated using RAPID software. CONTRAST:  76mL OMNIPAQUE IOHEXOL 350 MG/ML SOLN COMPARISON:  Brain MRI and intracranial MRA 07/19/2019. FINDINGS: CT Brain Perfusion Findings: ASPECTS: 10 CBF (<30%) Volume: None Perfusion (Tmax>6.0s) volume: None, and no significant T-max asymmetry from the left to right hemisphere. Mismatch Volume: Not applicable Infarction Location:Not applicable CT HEAD Brain: Calcified atherosclerosis at the skull base. Stable cortical encephalomalacia in the left occipital pole. Patchy bilateral white matter hypodensity is stable. Dystrophic basal ganglia calcifications again noted. No midline shift, ventriculomegaly, mass effect, evidence of mass lesion, intracranial hemorrhage or evidence of cortically based acute infarction. Calvarium and skull base: Mild chronic right orbital floor fracture. No acute osseous abnormality  identified. Paranasal sinuses: Sinuses and mastoids are largely clear today. Orbits: No acute orbit or scalp soft tissue finding. CTA NECK Skeleton: Advanced degenerative changes throughout the cervical spine. No acute osseous abnormality identified. Upper chest: Centrilobular emphysema. Mild apical lung scarring. No superior mediastinal lymphadenopathy. Sequelae of right axillary node dissection. Other neck: No acute findings. Aortic arch: Fairly bulky soft and calcified plaque throughout the aortic arch. Three vessel arch configuration. Right carotid system: Brachiocephalic artery and right CCA origin plaque without stenosis. Intermittent plaque in the right CCA without stenosis proximal to the bifurcation. Bulky calcified plaque at the carotid bifurcation and continuing into the proximal right ICA resulting in up to 60 % stenosis with respect to the distal vessel (series 7, image 103). Distal to the bulb no other right ICA plaque to the skull base. Left carotid system: Left CCA origin and other plaque without stenosis proximal to the bifurcation. Bulky  calcified plaque at the left ICA origin and bulb resulting in 55-60 % stenosis with respect to the distal vessel (series 10, image 101). Mildly tortuous left ICA. Vertebral arteries: Proximal right subclavian plaque without stenosis. The right vertebral artery origin appear spared. Mild right V2 segment plaque, no right vertebral stenosis to the skull base. Proximal left subclavian artery soft and calcified plaque is extensive although less than 50 % stenosis with respect to the distal vessel results. Mild stenosis at the left vertebral artery origin. Additional mild soft plaque in the left V1 segment. Patent and slightly non dominant left vertebral artery to the skull base without significant stenosis. CTA HEAD Posterior circulation: Bulky and circumferential calcified plaque of the left V4 segment with high-grade stenosis on series 9, image 145. This is  concordant with the MRA yesterday. Mild right V4 calcified plaque without significant stenosis. The left vertebral and left PICA remain patent despite the high-grade stenosis. There is moderate stenosis again at the left vertebrobasilar junction. Patent basilar artery with mild irregularity and no significant stenosis. Patent SCA and PCA origins. Posterior communicating arteries are diminutive or absent. Severe left PCA stenosis from the P2 segment on word. Moderate to severe right P2 stenosis with better distal right PCA enhancement. This is concordant with the MRA. Anterior circulation: Both ICA siphons are patent. Calcified plaque on the left with only mild supraclinoid segment stenosis. Right side calcified plaque and tortuosity with mild supraclinoid stenosis. Patent bilateral carotid termini. Patent MCA and ACA origins. Anterior communicating artery, bilateral ACA branches, right MCA M1, and right MCA bifurcation are patent without stenosis. Mild to moderate right M2 and M3 branch irregularities and stenosis appear similar to the MRA. Left MCA M1 demonstrates a relatively long 6 mm segment of moderate to severe stenosis best seen on series 12, image 20. This leads up to the left MCA trifurcation which remains patent. Left MCA branches demonstrate fairly symmetric enhancement to those on the right, and only mild additional irregularity. Venous sinuses: Early contrast timing, not evaluated. Anatomic variants: Mildly dominant right vertebral artery. Review of the MIP images confirms the above findings IMPRESSION: 1. Compensated Severe Left MCA M1 stenosis. CTP is negative for infarct core or penumbra. And CT appearance of the brain is stable. 2. Other intracranial CTA findings concordant to the MRA yesterday including: - Severe Left Vertebral V4 segment stenosis. - Moderate to severe bilateral PCA stenosis worse on the left. 3. Extracranial calcified plaque resulting in bilateral carotid bifurcation stenosis  affecting the ICAs: 60% on the Right and 55-60% on the Left . 4. No significant cervical vertebral artery stenosis. 5. Aortic Atherosclerosis (ICD10-I70.0) and Emphysema (ICD10-J43.9). Electronically Signed   By: Genevie Ann M.D.   On: 07/20/2019 04:20   DG CHEST PORT 1 VIEW  Result Date: 07/20/2019 CLINICAL DATA:  Hypothermia EXAM: PORTABLE CHEST 1 VIEW COMPARISON:  07/19/2019 FINDINGS: There is hyperinflation of the lungs compatible with COPD. Heart is normal size. Aortic atherosclerosis. No confluent opacities or effusions. No acute bony abnormality. IMPRESSION: COPD.  No active disease. Electronically Signed   By: Rolm Baptise M.D.   On: 07/20/2019 02:27   DG Chest Port 1 View  Result Date: 07/19/2019 CLINICAL DATA:  [place] EXAM: PORTABLE CHEST 1 VIEW COMPARISON:  07/17/2019 FINDINGS: Single frontal view of the chest was performed, excluding the right costophrenic angle by collimation. The cardiac silhouette is unremarkable. Stable atherosclerosis of the aortic arch. No airspace disease, effusion, or pneumothorax. No acute bony abnormalities. IMPRESSION: 1. Stable  exam, no acute process. Electronically Signed   By: Randa Ngo M.D.   On: 07/19/2019 18:31   ECHOCARDIOGRAM COMPLETE  Result Date: 07/20/2019    ECHOCARDIOGRAM REPORT   Patient Name:   [name] [name] Date of Exam: 07/20/2019 Medical Rec #:  [id number]             Height:       64.0 in Accession #:    [id number]            Weight:       100.0 lb Date of Birth:  01-22-1928            BSA:          1.457 m Patient Age:    70 years              BP:           155/63 mmHg Patient Gender: F                     HR:           62 bpm. Exam Location:  Inpatient Procedure: 2D Echo, Cardiac Doppler and Color Doppler Indications:    TIA 435.9 / G45.9  History:        Patient has prior history of Echocardiogram examinations, most                 recent 11/16/2015. CAD, Carotid Disease; Risk                 Factors:Hypertension and Dyslipidemia.  Hypothyroidism.  Sonographer:    Jonelle Sidle Dance Referring Phys: [id number] [place]  1. Left ventricular ejection fraction, by estimation, is 60 to 65%. The left ventricle has normal function. The left ventricle has no regional wall motion abnormalities. Left ventricular diastolic parameters are consistent with Grade I diastolic dysfunction (impaired relaxation). Elevated left atrial pressure.  2. Right ventricular systolic function is normal. The right ventricular size is normal. There is normal pulmonary artery systolic pressure. The estimated right ventricular systolic pressure is 61.9 mmHg.  3. The mitral valve is normal in structure. Mild mitral valve regurgitation. No evidence of mitral stenosis.  4. Tricuspid valve regurgitation is moderate.  5. The aortic valve is normal in structure. Aortic valve regurgitation is trivial. Mild aortic valve sclerosis is present, with no evidence of aortic valve stenosis.  6. The inferior vena cava is normal in size with greater than 50% respiratory variability, suggesting right atrial pressure of 3 mmHg. FINDINGS  Left Ventricle: Left ventricular ejection fraction, by estimation, is 60 to 65%. The left ventricle has normal function. The left ventricle has no regional wall motion abnormalities. The left ventricular internal cavity size was normal in size. There is  no left ventricular hypertrophy. Left ventricular diastolic parameters are consistent with Grade I diastolic dysfunction (impaired relaxation). Elevated left atrial pressure. Right Ventricle: The right ventricular size is normal. No increase in right ventricular wall thickness. Right ventricular systolic function is normal. There is normal pulmonary artery systolic pressure. The tricuspid regurgitant velocity is 2.30 m/s, and  with an assumed right atrial pressure of 8 mmHg, the estimated right ventricular systolic pressure is 50.9 mmHg. Left Atrium: Left atrial size was normal in size. Right Atrium:  Right atrial size was normal in size. Pericardium: There is no evidence of pericardial effusion. Mitral Valve: The mitral valve is normal in structure. Normal mobility of the mitral valve leaflets. Mild mitral valve  regurgitation. No evidence of mitral valve stenosis. Tricuspid Valve: The tricuspid valve is normal in structure. Tricuspid valve regurgitation is moderate . No evidence of tricuspid stenosis. Aortic Valve: The aortic valve is normal in structure. Aortic valve regurgitation is trivial. Aortic regurgitation PHT measures 503 msec. Mild aortic valve sclerosis is present, with no evidence of aortic valve stenosis. Pulmonic Valve: The pulmonic valve was normal in structure. Pulmonic valve regurgitation is not visualized. No evidence of pulmonic stenosis. Aorta: The aortic root is normal in size and structure. Venous: The inferior vena cava is normal in size with greater than 50% respiratory variability, suggesting right atrial pressure of 3 mmHg. IAS/Shunts: No atrial level shunt detected by color flow Doppler.  LEFT VENTRICLE PLAX 2D LVIDd:         3.90 cm  Diastology LVIDs:         2.70 cm  LV e' lateral:   4.40 cm/s LV PW:         0.90 cm  LV E/e' lateral: 12.4 LV IVS:        0.80 cm  LV e' medial:    3.50 cm/s LVOT diam:     2.20 cm  LV E/e' medial:  15.5 LV SV:         67 LV SV Index:   46 LVOT Area:     3.80 cm  RIGHT VENTRICLE             IVC RV Basal diam:  3.40 cm     IVC diam: 2.20 cm RV Mid diam:    2.30 cm RV S prime:     10.90 cm/s TAPSE (M-mode): 2.5 cm LEFT ATRIUM             Index       RIGHT ATRIUM           Index LA diam:        3.10 cm 2.13 cm/m  RA Area:     15.40 cm LA Vol (A2C):   19.7 ml 13.52 ml/m RA Volume:   37.60 ml  25.80 ml/m LA Vol (A4C):   23.3 ml 15.99 ml/m LA Biplane Vol: 21.6 ml 14.82 ml/m  AORTIC VALVE LVOT Vmax:   66.60 cm/s LVOT Vmean:  43.000 cm/s LVOT VTI:    0.176 m AI PHT:      503 msec  AORTA Ao Root diam: 2.80 cm Ao Asc diam:  3.50 cm MITRAL VALVE                TRICUSPID VALVE MV Area (PHT): 2.22 cm    TR Peak grad:   21.2 mmHg MV Decel Time: 342 msec    TR Vmax:        230.00 cm/s MV E velocity: 54.40 cm/s MV A velocity: 75.80 cm/s  SHUNTS MV E/A ratio:  0.72        Systemic VTI:  0.18 m                            Systemic Diam: 2.20 cm Dani Gobble Croitoru MD Electronically signed by Sanda Klein MD Signature Date/Time: 07/20/2019/10:39:24 AM    Final    VAS US CAROTID (at [facility] and [facility] only)  Result Date: 07/20/2019 Carotid Arterial Duplex Study Indications:       TIA and Carotid artery disease. Risk Factors:      Hypertension, hyperlipidemia. Comparison Study:  03/22/17 bilateral 40-59% Performing Technologist: June Leap RDMS,  RVT  Examination Guidelines: A complete evaluation includes B-mode imaging, spectral Doppler, color Doppler, and power Doppler as needed of all accessible portions of each vessel. Bilateral testing is considered an integral part of a complete examination. Limited examinations for reoccurring indications may be performed as noted.  Right Carotid Findings: +----------+--------+--------+--------+--------------------------+--------+           PSV cm/sEDV cm/sStenosisPlaque Description        Comments +----------+--------+--------+--------+--------------------------+--------+ CCA Prox  57      6                                                  +----------+--------+--------+--------+--------------------------+--------+ CCA Distal73      11                                                 +----------+--------+--------+--------+--------------------------+--------+ ICA Prox  166     33      40-59%  heterogenous and irregular         +----------+--------+--------+--------+--------------------------+--------+ ICA Distal66      19                                                 +----------+--------+--------+--------+--------------------------+--------+ ECA       152     12                                                  +----------+--------+--------+--------+--------------------------+--------+ +----------+--------+-------+----------------+-------------------+           PSV cm/sEDV cmsDescribe        Arm Pressure (mmHG) +----------+--------+-------+----------------+-------------------+ [id number]             Multiphasic, WNL                    +----------+--------+-------+----------------+-------------------+ +---------+--------+--+--------+--+---------+ VertebralPSV cm/s41EDV cm/s11Antegrade +---------+--------+--+--------+--+---------+  Left Carotid Findings: +----------+--------+--------+--------+--------------------+-------------------+           PSV cm/sEDV cm/sStenosisPlaque Description  Comments            +----------+--------+--------+--------+--------------------+-------------------+ CCA Prox  64      9                                                       +----------+--------+--------+--------+--------------------+-------------------+ CCA Distal54      15                                                      +----------+--------+--------+--------+--------------------+-------------------+ ICA Prox  115     23      1-39%   calcific and        acoustic shadowing  irregular           may obscure higher                                                        velocities          +----------+--------+--------+--------+--------------------+-------------------+ ICA Distal96      13                                                      +----------+--------+--------+--------+--------------------+-------------------+ ECA       61      3                                                       +----------+--------+--------+--------+--------------------+-------------------+ +----------+--------+--------+--------+-------------------+           PSV cm/sEDV cm/sDescribeArm Pressure (mmHG)  +----------+--------+--------+--------+-------------------+ [id number]             Stenotic                    +----------+--------+--------+--------+-------------------+ +---------+--------+--+--------+--+---------+ VertebralPSV cm/s97EDV cm/s12Antegrade +---------+--------+--+--------+--+---------+   Summary: Right Carotid: Velocities in the right ICA are consistent with a 40-59%                stenosis. Left Carotid: Velocities in the left ICA are consistent with a 1-39% stenosis. Subclavians: Left subclavian artery was stenotic. *See table(s) above for measurements and observations.  Electronically signed by Antony Contras MD on 07/20/2019 at 10:57:45 AM.    Final     Medications:   . aspirin EC  81 mg Oral Daily  . budesonide  1 mg Inhalation BID  . clopidogrel  75 mg Oral Daily  . heparin  5,000 Units Subcutaneous Q8H  . umeclidinium bromide  1 puff Inhalation Daily   Continuous Infusions:   LOS: 0 days   [name] [name]  [facility]     07/20/2019, 1:13 PM

## 2019-07-21 ENCOUNTER — Inpatient Hospital Stay (HOSPITAL_COMMUNITY): Payer: Medicare HMO

## 2019-07-21 DIAGNOSIS — I63512 Cerebral infarction due to unspecified occlusion or stenosis of left middle cerebral artery: Secondary | ICD-10-CM

## 2019-07-21 MED ORDER — FUROSEMIDE 10 MG/ML IJ SOLN
20.0000 mg | Freq: Once | INTRAMUSCULAR | Status: AC
  Administered 2019-07-21: 20 mg via INTRAVENOUS
  Filled 2019-07-21: qty 4

## 2019-07-21 MED ORDER — AMLODIPINE BESYLATE 10 MG PO TABS
10.0000 mg | ORAL_TABLET | Freq: Every day | ORAL | Status: DC
  Administered 2019-07-21 – 2019-07-30 (×10): 10 mg via ORAL
  Filled 2019-07-21 (×10): qty 1

## 2019-07-21 MED ORDER — METOPROLOL SUCCINATE ER 25 MG PO TB24
25.0000 mg | ORAL_TABLET | Freq: Every day | ORAL | Status: DC
  Administered 2019-07-21 – 2019-07-30 (×10): 25 mg via ORAL
  Filled 2019-07-21 (×10): qty 1

## 2019-07-21 NOTE — Progress Notes (Addendum)
Progress Note    YASHICA STERBENZ  QQI:[id number] DOB: Jun 09, 1927  DOA: 07/19/2019 PCP: Burnis Medin, MD      Brief Narrative:    Medical records reviewed and are as summarized below:  BERDIA LACHMAN is a 86 y.o. female with medical history significant for CAD s/p stenting to RCA 2005, PVD, COPD, CKD stage III, HTN, HLD, CAS (40-59% stenosis right and left ICAs) who presented to the ED for evaluation of left-sided weakness and intermittent expressive aphasia.    Patient reported intermittent and waxing/waning left-sided weakness and expressive aphasia over the last 2-3 days.  Symptoms would occur and resolve without obvious inciting or improving factors.  Initial CT head and MRI brain did not show any acute stroke.  Later in the hospital, she developed right-sided weakness.  Repeat MRI brain showed acute infarcts in the left corona radiata and basal ganglia.  Neurologist recommended dual antiplatelet therapy with aspirin and Plavix for 3 months followed by Plavix monotherapy because of severe left MCA stenosis.    Assessment/Plan:   Principal Problem:   Acute ischemic left MCA stroke (HCC) Active Problems:   Hyperlipidemia   Essential hypertension, benign   Peripheral vascular disease (HCC)   COPD (chronic obstructive pulmonary disease) (HCC)   Left-sided weakness   Hypokalemia   CAD (coronary artery disease), native coronary artery   Aphasia   Cerebral thrombosis with cerebral infarction   Acute ischemic stroke left corona radiata and basal ganglia, expressive aphasia, right sided weakness Bilateral carotid artery stenosis Initial MRI did not show any acute infarct but repeat MRI brain showed acute infarcts in the left corona radiata and basal ganglia. CTA head and neck showed:  1. compensated Severe Left MCA M1 stenosis. CTP is negative for infarct core or penumbra. And CT appearance of the brain is stable.  2. Other intracranial CTA findings  concordant to the MRA yesterday including: - Severe Left Vertebral V4 segment stenosis. - Moderate to severe bilateral PCA stenosis worse on the left.  3. Extracranial calcified plaque resulting in bilateral carotid bifurcation stenosis affecting the ICAs: 60% on the Right and 55-60% on the Left .   -Echocardiogram showed EF estimated laxity 94%, grade 1 diastolic dysfunction, moderate TR -Monitor on telemetry, and continue neurochecks -Continue PT, OT and ST.  Speech therapist recommended regular diet. Lipid panel showed LDL of 120, cholesterol 185, HDL 51 Hemoglobin A1c is pending -Neurologist recommended aspirin and Plavix for 3 months followed by Plavix monotherapy given left MCA high-grade stenosis. -Patient intolerant to statins Neurologist has signed off the case.    CAD s/p stenting to RCA PVD with occluded left SFA on medical management: Chronic and stable, denies any chest pain.  Continue aspirin 81 mg daily.  Intolerant to statins.  Hypokalemia: Replete orally and recheck in a.m.  Hypertension: Resume metoprolol and amlodipine.  Hold Imdur for now.  Continue to monitor BP.    COPD: She is wheezing a lot today.  Repeat chest x-ray today did not show any acute abnormality.  Continue Flovent, Spiriva, and as needed Xopenex.  Hyperlipidemia: Intolerant to statins, preferred dietary change rather than alternate agents per prior cardiology documentation.  CKD stage IIIa: Chronic and appears stable.  Continue to monitor.  Body mass index is 17.16 kg/m.   Family Communication/Anticipated D/C date and plan/Code Status   DVT prophylaxis: heparin injection 5,000 Units Start: 07/19/19 2245   Code Status: Full code Family Communication: Plan discussed with her granddaughter, [name].  She said she prefers to take the patient home rather than let her go to a skilled nursing facility. Disposition Plan:    Status is: Inpatient  Remains inpatient appropriate  because:Inpatient level of care appropriate due to severity of illness   Dispo: The patient is from: Home              Anticipated d/c is to: Home              Anticipated d/c date is: 2 days              Patient currently is not medically stable to d/c.                 Subjective:   C/o cough and right-sided weakness  Objective:    Vitals:   07/21/19 0745 07/21/19 1010 07/21/19 1034 07/21/19 1149  BP:  (!) 180/67  (!) 166/67  Pulse:  65  62  Resp:  (!) 22  16  Temp:  (!) 97.2 F (36.2 C)  97.7 F (36.5 C)  TempSrc:  Axillary  Oral  SpO2: 98% 100% 99% 100%  Weight:      Height:       No data found.   Intake/Output Summary (Last 24 hours) at 07/21/2019 1223 Last data filed at 07/20/2019 1602 Gross per 24 hour  Intake 240 ml  Output --  Net 240 ml   Filed Weights   07/19/19 1743  Weight: 45.4 kg    Exam:  GEN: NAD SKIN: Multiple bruises/ecchymoses on b/l upper and lower extremities EYES: EOMI ENT: MMM CV: RRR PULM: B/l wheezing, mild rattling in her chest ABD: soft, ND, NT, +BS CNS: AAO x 3, dysarthria, right facial droop, power is 2/5 in RUE and 1/5 in RLE EXT: No edema or tenderness   Data Reviewed:   I have personally reviewed following labs and imaging studies:  Labs: Labs show the following:   Basic Metabolic Panel: Recent Labs  Lab 07/17/19 1657 07/17/19 1657 07/19/19 1755 07/19/19 1755 07/19/19 1825 07/20/19 0407  NA 143  --  144  --  145 139  K 3.5   < > 3.4*   < > 3.4* 3.6  CL 105  --  108  --  105 104  CO2 30  --  26  --   --  26  GLUCOSE 87  --  90  --  88 114*  BUN 20  --  25*  --  30* 17  CREATININE 1.20*  --  1.23*  --  1.30* 0.90  CALCIUM 9.6  --  9.4  --   --  8.9  MG  --   --   --   --   --  1.7  PHOS  --   --   --   --   --  2.2*   < > = values in this interval not displayed.   GFR Estimated Creatinine Clearance: 30.4 mL/min (by C-G formula based on SCr of 0.9 mg/dL). Liver Function Tests: Recent Labs   Lab 07/19/19 1755  AST 20  ALT 12  ALKPHOS 77  BILITOT 0.6  PROT 6.0*  ALBUMIN 3.3*   No results for input(s): LIPASE, AMYLASE in the last 168 hours. No results for input(s): AMMONIA in the last 168 hours. Coagulation profile Recent Labs  Lab 07/19/19 1755  INR 1.0    CBC: Recent Labs  Lab 07/17/19 1657 07/19/19 1825 07/19/19 1918 07/20/19 0407  WBC 7.3  --  7.5 8.3  NEUTROABS  --   --  5.2  --   HGB 14.4 13.6 13.4 14.6  HCT 45.7 40.0 42.8 45.9  MCV 94.0  --  94.7 93.7  PLT 206  --  178 187   Cardiac Enzymes: No results for input(s): CKTOTAL, CKMB, CKMBINDEX, TROPONINI in the last 168 hours. BNP (last 3 results) No results for input(s): PROBNP in the last 8760 hours. CBG: No results for input(s): GLUCAP in the last 168 hours. D-Dimer: No results for input(s): DDIMER in the last 72 hours. Hgb A1c: Recent Labs    07/20/19 0407  HGBA1C 5.6   Lipid Profile: Recent Labs    07/20/19 0407  CHOL 185  HDL 51  LDLCALC 120*  TRIG 70  CHOLHDL 3.6   Thyroid function studies: No results for input(s): TSH, T4TOTAL, T3FREE, THYROIDAB in the last 72 hours.  Invalid input(s): FREET3 Anemia work up: No results for input(s): VITAMINB12, FOLATE, FERRITIN, TIBC, IRON, RETICCTPCT in the last 72 hours. Sepsis Labs: Recent Labs  Lab 07/17/19 1657 07/19/19 1918 07/20/19 0407  WBC 7.3 7.5 8.3    Microbiology Recent Results (from the past 240 hour(s))  SARS Coronavirus 2 by RT PCR (hospital order, performed in [facility] hospital lab) Nasopharyngeal Nasopharyngeal Swab     Status: None   Collection Time: 07/19/19 10:53 PM   Specimen: Nasopharyngeal Swab  Result Value Ref Range Status   SARS Coronavirus 2 NEGATIVE NEGATIVE Final    Comment: (NOTE) SARS-CoV-2 target nucleic acids are NOT DETECTED.  The SARS-CoV-2 RNA is generally detectable in upper and lower respiratory specimens during the acute phase of infection. The lowest concentration of SARS-CoV-2 viral  copies this assay can detect is 250 copies / mL. A negative result does not preclude SARS-CoV-2 infection and should not be used as the sole basis for treatment or other patient management decisions.  A negative result may occur with improper specimen collection / handling, submission of specimen other than nasopharyngeal swab, presence of viral mutation(s) within the areas targeted by this assay, and inadequate number of viral copies (<250 copies / mL). A negative result must be combined with clinical observations, patient history, and epidemiological information.  Fact Sheet for Patients:   StrictlyIdeas.no  Fact Sheet for Healthcare Providers: BankingDealers.co.za  This test is not yet approved or  cleared by the [place] FDA and has been authorized for detection and/or diagnosis of SARS-CoV-2 by FDA under an Emergency Use Authorization (EUA).  This EUA will remain in effect (meaning this test can be used) for the duration of the COVID-19 declaration under Section 564(b)(1) of the Act, 21 U.S.C. section 360bbb-3(b)(1), unless the authorization is terminated or revoked sooner.  Performed at [facility], [place] [address], [place], [place] [address]     Procedures and diagnostic studies:  EEG  Result Date: 07/20/2019 Lora Havens, MD     07/20/2019  3:39 PM Patient Name: JERMESHA SOTTILE MRN: [id number] Epilepsy Attending: Lora Havens Referring Physician/Provider: Dr. Kerney Elbe Date: 07/20/2019 Duration: 24.12 mins Patient history: 86 year old female presenting with transient spells of expressive aphasia. EEG evaluate for seizures. Level of alertness: Awake AEDs during EEG study: None Technical aspects: This EEG study was done with scalp electrodes positioned according to the 10-20 International system of electrode placement. Electrical activity was acquired at a sampling rate of 500Hz  and reviewed with a high  frequency filter of 70Hz  and a low frequency filter of 1Hz . EEG data were recorded  continuously and digitally stored. Description: The posterior dominant rhythm consists of 8 Hz activity of moderate voltage (25-35 uV) seen predominantly in posterior head regions, symmetric and reactive to eye opening and eye closing. EEG showed intermittent 2-3Hz  delta slowing in left temporal region.  Hyperventilation and photic stimulation were not performed.   ABNORMALITY -Intermittent slow, left temporal region IMPRESSION: This study is suggestive of non specific cortical dysfunction in left temporal region. No seizures or epileptiform discharges were seen throughout the recording. Lora Havens   CT Code Stroke CTA Head W/WO contrast  Result Date: 07/20/2019 CLINICAL DATA:  86 year old female with neurologic deficit. Unrevealing brain MRI yesterday, but MRA with severe left M1 stenosis. EXAM: CT ANGIOGRAPHY HEAD AND NECK CT PERFUSION BRAIN TECHNIQUE: Multidetector CT imaging of the head and neck was performed using the standard protocol during bolus administration of intravenous contrast. Multiplanar CT image reconstructions and MIPs were obtained to evaluate the vascular anatomy. Carotid stenosis measurements (when applicable) are obtained utilizing NASCET criteria, using the distal internal carotid diameter as the denominator. Multiphase CT imaging of the brain was performed following IV bolus contrast injection. Subsequent parametric perfusion maps were calculated using RAPID software. CONTRAST:  5mL OMNIPAQUE IOHEXOL 350 MG/ML SOLN COMPARISON:  Brain MRI and intracranial MRA 07/19/2019. FINDINGS: CT Brain Perfusion Findings: ASPECTS: 10 CBF (<30%) Volume: None Perfusion (Tmax>6.0s) volume: None, and no significant T-max asymmetry from the left to right hemisphere. Mismatch Volume: Not applicable Infarction Location:Not applicable CT HEAD Brain: Calcified atherosclerosis at the skull base. Stable cortical  encephalomalacia in the left occipital pole. Patchy bilateral white matter hypodensity is stable. Dystrophic basal ganglia calcifications again noted. No midline shift, ventriculomegaly, mass effect, evidence of mass lesion, intracranial hemorrhage or evidence of cortically based acute infarction. Calvarium and skull base: Mild chronic right orbital floor fracture. No acute osseous abnormality identified. Paranasal sinuses: Sinuses and mastoids are largely clear today. Orbits: No acute orbit or scalp soft tissue finding. CTA NECK Skeleton: Advanced degenerative changes throughout the cervical spine. No acute osseous abnormality identified. Upper chest: Centrilobular emphysema. Mild apical lung scarring. No superior mediastinal lymphadenopathy. Sequelae of right axillary node dissection. Other neck: No acute findings. Aortic arch: Fairly bulky soft and calcified plaque throughout the aortic arch. Three vessel arch configuration. Right carotid system: Brachiocephalic artery and right CCA origin plaque without stenosis. Intermittent plaque in the right CCA without stenosis proximal to the bifurcation. Bulky calcified plaque at the carotid bifurcation and continuing into the proximal right ICA resulting in up to 60 % stenosis with respect to the distal vessel (series 7, image 103). Distal to the bulb no other right ICA plaque to the skull base. Left carotid system: Left CCA origin and other plaque without stenosis proximal to the bifurcation. Bulky calcified plaque at the left ICA origin and bulb resulting in 55-60 % stenosis with respect to the distal vessel (series 10, image 101). Mildly tortuous left ICA. Vertebral arteries: Proximal right subclavian plaque without stenosis. The right vertebral artery origin appear spared. Mild right V2 segment plaque, no right vertebral stenosis to the skull base. Proximal left subclavian artery soft and calcified plaque is extensive although less than 50 % stenosis with respect to  the distal vessel results. Mild stenosis at the left vertebral artery origin. Additional mild soft plaque in the left V1 segment. Patent and slightly non dominant left vertebral artery to the skull base without significant stenosis. CTA HEAD Posterior circulation: Bulky and circumferential calcified plaque of the left V4 segment with  high-grade stenosis on series 9, image 145. This is concordant with the MRA yesterday. Mild right V4 calcified plaque without significant stenosis. The left vertebral and left PICA remain patent despite the high-grade stenosis. There is moderate stenosis again at the left vertebrobasilar junction. Patent basilar artery with mild irregularity and no significant stenosis. Patent SCA and PCA origins. Posterior communicating arteries are diminutive or absent. Severe left PCA stenosis from the P2 segment on word. Moderate to severe right P2 stenosis with better distal right PCA enhancement. This is concordant with the MRA. Anterior circulation: Both ICA siphons are patent. Calcified plaque on the left with only mild supraclinoid segment stenosis. Right side calcified plaque and tortuosity with mild supraclinoid stenosis. Patent bilateral carotid termini. Patent MCA and ACA origins. Anterior communicating artery, bilateral ACA branches, right MCA M1, and right MCA bifurcation are patent without stenosis. Mild to moderate right M2 and M3 branch irregularities and stenosis appear similar to the MRA. Left MCA M1 demonstrates a relatively long 6 mm segment of moderate to severe stenosis best seen on series 12, image 20. This leads up to the left MCA trifurcation which remains patent. Left MCA branches demonstrate fairly symmetric enhancement to those on the right, and only mild additional irregularity. Venous sinuses: Early contrast timing, not evaluated. Anatomic variants: Mildly dominant right vertebral artery. Review of the MIP images confirms the above findings IMPRESSION: 1. Compensated  Severe Left MCA M1 stenosis. CTP is negative for infarct core or penumbra. And CT appearance of the brain is stable. 2. Other intracranial CTA findings concordant to the MRA yesterday including: - Severe Left Vertebral V4 segment stenosis. - Moderate to severe bilateral PCA stenosis worse on the left. 3. Extracranial calcified plaque resulting in bilateral carotid bifurcation stenosis affecting the ICAs: 60% on the Right and 55-60% on the Left . 4. No significant cervical vertebral artery stenosis. 5. Aortic Atherosclerosis (ICD10-I70.0) and Emphysema (ICD10-J43.9). Electronically Signed   By: Genevie Ann M.D.   On: 07/20/2019 04:20   CT Code Stroke CTA Neck W/WO contrast  Result Date: 07/20/2019 CLINICAL DATA:  86 year old female with neurologic deficit. Unrevealing brain MRI yesterday, but MRA with severe left M1 stenosis. EXAM: CT ANGIOGRAPHY HEAD AND NECK CT PERFUSION BRAIN TECHNIQUE: Multidetector CT imaging of the head and neck was performed using the standard protocol during bolus administration of intravenous contrast. Multiplanar CT image reconstructions and MIPs were obtained to evaluate the vascular anatomy. Carotid stenosis measurements (when applicable) are obtained utilizing NASCET criteria, using the distal internal carotid diameter as the denominator. Multiphase CT imaging of the brain was performed following IV bolus contrast injection. Subsequent parametric perfusion maps were calculated using RAPID software. CONTRAST:  74mL OMNIPAQUE IOHEXOL 350 MG/ML SOLN COMPARISON:  Brain MRI and intracranial MRA 07/19/2019. FINDINGS: CT Brain Perfusion Findings: ASPECTS: 10 CBF (<30%) Volume: None Perfusion (Tmax>6.0s) volume: None, and no significant T-max asymmetry from the left to right hemisphere. Mismatch Volume: Not applicable Infarction Location:Not applicable CT HEAD Brain: Calcified atherosclerosis at the skull base. Stable cortical encephalomalacia in the left occipital pole. Patchy bilateral white  matter hypodensity is stable. Dystrophic basal ganglia calcifications again noted. No midline shift, ventriculomegaly, mass effect, evidence of mass lesion, intracranial hemorrhage or evidence of cortically based acute infarction. Calvarium and skull base: Mild chronic right orbital floor fracture. No acute osseous abnormality identified. Paranasal sinuses: Sinuses and mastoids are largely clear today. Orbits: No acute orbit or scalp soft tissue finding. CTA NECK Skeleton: Advanced degenerative changes throughout the cervical spine.  No acute osseous abnormality identified. Upper chest: Centrilobular emphysema. Mild apical lung scarring. No superior mediastinal lymphadenopathy. Sequelae of right axillary node dissection. Other neck: No acute findings. Aortic arch: Fairly bulky soft and calcified plaque throughout the aortic arch. Three vessel arch configuration. Right carotid system: Brachiocephalic artery and right CCA origin plaque without stenosis. Intermittent plaque in the right CCA without stenosis proximal to the bifurcation. Bulky calcified plaque at the carotid bifurcation and continuing into the proximal right ICA resulting in up to 60 % stenosis with respect to the distal vessel (series 7, image 103). Distal to the bulb no other right ICA plaque to the skull base. Left carotid system: Left CCA origin and other plaque without stenosis proximal to the bifurcation. Bulky calcified plaque at the left ICA origin and bulb resulting in 55-60 % stenosis with respect to the distal vessel (series 10, image 101). Mildly tortuous left ICA. Vertebral arteries: Proximal right subclavian plaque without stenosis. The right vertebral artery origin appear spared. Mild right V2 segment plaque, no right vertebral stenosis to the skull base. Proximal left subclavian artery soft and calcified plaque is extensive although less than 50 % stenosis with respect to the distal vessel results. Mild stenosis at the left vertebral  artery origin. Additional mild soft plaque in the left V1 segment. Patent and slightly non dominant left vertebral artery to the skull base without significant stenosis. CTA HEAD Posterior circulation: Bulky and circumferential calcified plaque of the left V4 segment with high-grade stenosis on series 9, image 145. This is concordant with the MRA yesterday. Mild right V4 calcified plaque without significant stenosis. The left vertebral and left PICA remain patent despite the high-grade stenosis. There is moderate stenosis again at the left vertebrobasilar junction. Patent basilar artery with mild irregularity and no significant stenosis. Patent SCA and PCA origins. Posterior communicating arteries are diminutive or absent. Severe left PCA stenosis from the P2 segment on word. Moderate to severe right P2 stenosis with better distal right PCA enhancement. This is concordant with the MRA. Anterior circulation: Both ICA siphons are patent. Calcified plaque on the left with only mild supraclinoid segment stenosis. Right side calcified plaque and tortuosity with mild supraclinoid stenosis. Patent bilateral carotid termini. Patent MCA and ACA origins. Anterior communicating artery, bilateral ACA branches, right MCA M1, and right MCA bifurcation are patent without stenosis. Mild to moderate right M2 and M3 branch irregularities and stenosis appear similar to the MRA. Left MCA M1 demonstrates a relatively long 6 mm segment of moderate to severe stenosis best seen on series 12, image 20. This leads up to the left MCA trifurcation which remains patent. Left MCA branches demonstrate fairly symmetric enhancement to those on the right, and only mild additional irregularity. Venous sinuses: Early contrast timing, not evaluated. Anatomic variants: Mildly dominant right vertebral artery. Review of the MIP images confirms the above findings IMPRESSION: 1. Compensated Severe Left MCA M1 stenosis. CTP is negative for infarct core or  penumbra. And CT appearance of the brain is stable. 2. Other intracranial CTA findings concordant to the MRA yesterday including: - Severe Left Vertebral V4 segment stenosis. - Moderate to severe bilateral PCA stenosis worse on the left. 3. Extracranial calcified plaque resulting in bilateral carotid bifurcation stenosis affecting the ICAs: 60% on the Right and 55-60% on the Left . 4. No significant cervical vertebral artery stenosis. 5. Aortic Atherosclerosis (ICD10-I70.0) and Emphysema (ICD10-J43.9). Electronically Signed   By: Genevie Ann M.D.   On: 07/20/2019 04:20   MR  ANGIO HEAD WO CONTRAST  Result Date: 07/20/2019 CLINICAL DATA:  Follow-up examination for acute stroke. EXAM: MRA HEAD WITHOUT CONTRAST TECHNIQUE: Angiographic images of the Circle of Willis were obtained using MRA technique without intravenous contrast. COMPARISON:  Comparison made with prior brain MRI from earlier the same day. FINDINGS: ANTERIOR CIRCULATION: Examination mildly degraded by motion artifact. Distal cervical segments of both internal carotid arteries are widely patent with symmetric antegrade flow. Petrous, cavernous, and supraclinoid ICAs widely patent without stenosis or other abnormality. A1 segments patent. Normal anterior communicating artery complex. Anterior cerebral arteries patent to their distal aspects without stenosis. Right M1 widely patent. Normal right MCA bifurcation. Distal right MCA branches well perfused. Focal severe stenosis seen involving the mid-distal left M1 segment (series 1045, image 13). Stenosis measures approximately 6 mm in length. Left MCA bifurcation patent distally. Distal left MCA branches are perfused, although mildly attenuated as compared to the contralateral right MCA branches. POSTERIOR CIRCULATION: Right vertebral artery dominant and widely patent to the vertebrobasilar junction. Left vertebral artery not seen as it courses into the cranial vault, and may be partially occluded. Flow related  signal is seen within the distal left V4 segment with filling of the left PICA, which could be retrograde in nature across the vertebrobasilar junction. Right PICA not seen. Basilar widely patent to its distal aspect without stenosis. Superior cerebral arteries patent bilaterally. Both PCAs primarily supplied via the basilar. Atheromatous change with associated moderate bilateral P2 stenoses noted. PCAs remain patent to their distal aspects. No intracranial aneurysm. IMPRESSION: 1. 6 mm short segment severe stenosis involving the mid-distal left M1 segment. Distal left MCA branches are perfused, although attenuated as compared to the contralateral right MCA branches. 2. Nonvisualization of the left vertebral artery as it courses into the cranial vault, which may be partially occluded. Retrograde filling of the left V4 segment and left PICA across the vertebrobasilar junction. 3. Atheromatous change throughout the PCAs bilaterally with associated moderate bilateral P2 stenoses. Electronically Signed   By: Jeannine Boga M.D.   On: 07/20/2019 02:00   MR BRAIN WO CONTRAST  Result Date: 07/20/2019 CLINICAL DATA:  Stroke follow-up. Right facial droop and mild right hemiparesis. EXAM: MRI HEAD WITHOUT CONTRAST TECHNIQUE: Multiplanar, multiecho pulse sequences of the brain and surrounding structures were obtained without intravenous contrast. COMPARISON:  Head CT, CTA, and CTP 07/20/2019 and MRI 07/19/2019 FINDINGS: At the request of the ordering neurologist, only axial and coronal diffusion weighted imaging was obtained. There are new punctate acute infarcts involving the left corona radiata and superior left lentiform nucleus. No intracranial mass effect or extra-axial fluid collection is identified. Assessment of chronic findings including chronic small vessel ischemia in the cerebral white matter is deferred to yesterday's complete brain MRI. IMPRESSION: Punctate acute infarcts in the left corona radiata and  basal ganglia. Electronically Signed   By: Logan Bores M.D.   On: 07/20/2019 20:49   MR BRAIN WO CONTRAST  Result Date: 07/19/2019 CLINICAL DATA:  Ataxia. EXAM: MRI HEAD WITHOUT CONTRAST TECHNIQUE: Multiplanar, multiecho pulse sequences of the brain and surrounding structures were obtained without intravenous contrast. COMPARISON:  Head CT 06/08/2016 FINDINGS: Brain: Diffusion imaging does not show any acute or subacute infarction. There chronic small-vessel ischemic changes of the pons. No focal cerebellar stroke. There is an old left occipital cortical and subcortical infarction. There are chronic small-vessel ischemic changes of the cerebral hemispheric white matter. Old small vessel infarction in the right thalamus. No sign of mass lesion, hemorrhage, hydrocephalus or  extra-axial collection. Vascular: Major vessels at the base of the brain show flow. Skull and upper cervical spine: 2 foci of signal within the posterior parietal calvarium towards the vertex, most consistent with hemangiomas. Sinuses/Orbits: Clear/normal Other: None IMPRESSION: No acute brain finding. No abnormality seen to explain acute ataxia. Chronic small-vessel change of the pons and hemispheric white matter. 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 Left mastoid effusion. Electronically Signed   By: Nelson Chimes M.D.   On: 07/19/2019 18:52   CT Code Stroke Cerebral Perfusion with contrast  Result Date: 07/20/2019 CLINICAL DATA:  86 year old female with neurologic deficit. Unrevealing brain MRI yesterday, but MRA with severe left M1 stenosis. EXAM: CT ANGIOGRAPHY HEAD AND NECK CT PERFUSION BRAIN TECHNIQUE: Multidetector CT imaging of the head and neck was performed using the standard protocol during bolus administration of intravenous contrast. Multiplanar CT image reconstructions and MIPs were obtained to evaluate the vascular anatomy. Carotid stenosis measurements (when applicable) are obtained utilizing NASCET  criteria, using the distal internal carotid diameter as the denominator. Multiphase CT imaging of the brain was performed following IV bolus contrast injection. Subsequent parametric perfusion maps were calculated using RAPID software. CONTRAST:  34mL OMNIPAQUE IOHEXOL 350 MG/ML SOLN COMPARISON:  Brain MRI and intracranial MRA 07/19/2019. FINDINGS: CT Brain Perfusion Findings: ASPECTS: 10 CBF (<30%) Volume: None Perfusion (Tmax>6.0s) volume: None, and no significant T-max asymmetry from the left to right hemisphere. Mismatch Volume: Not applicable Infarction Location:Not applicable CT HEAD Brain: Calcified atherosclerosis at the skull base. Stable cortical encephalomalacia in the left occipital pole. Patchy bilateral white matter hypodensity is stable. Dystrophic basal ganglia calcifications again noted. No midline shift, ventriculomegaly, mass effect, evidence of mass lesion, intracranial hemorrhage or evidence of cortically based acute infarction. Calvarium and skull base: Mild chronic right orbital floor fracture. No acute osseous abnormality identified. Paranasal sinuses: Sinuses and mastoids are largely clear today. Orbits: No acute orbit or scalp soft tissue finding. CTA NECK Skeleton: Advanced degenerative changes throughout the cervical spine. No acute osseous abnormality identified. Upper chest: Centrilobular emphysema. Mild apical lung scarring. No superior mediastinal lymphadenopathy. Sequelae of right axillary node dissection. Other neck: No acute findings. Aortic arch: Fairly bulky soft and calcified plaque throughout the aortic arch. Three vessel arch configuration. Right carotid system: Brachiocephalic artery and right CCA origin plaque without stenosis. Intermittent plaque in the right CCA without stenosis proximal to the bifurcation. Bulky calcified plaque at the carotid bifurcation and continuing into the proximal right ICA resulting in up to 60 % stenosis with respect to the distal vessel (series  7, image 103). Distal to the bulb no other right ICA plaque to the skull base. Left carotid system: Left CCA origin and other plaque without stenosis proximal to the bifurcation. Bulky calcified plaque at the left ICA origin and bulb resulting in 55-60 % stenosis with respect to the distal vessel (series 10, image 101). Mildly tortuous left ICA. Vertebral arteries: Proximal right subclavian plaque without stenosis. The right vertebral artery origin appear spared. Mild right V2 segment plaque, no right vertebral stenosis to the skull base. Proximal left subclavian artery soft and calcified plaque is extensive although less than 50 % stenosis with respect to the distal vessel results. Mild stenosis at the left vertebral artery origin. Additional mild soft plaque in the left V1 segment. Patent and slightly non dominant left vertebral artery to the skull base without significant stenosis. CTA HEAD Posterior circulation: Bulky and circumferential calcified plaque of the left V4 segment with high-grade stenosis on  series 9, image 145. This is concordant with the MRA yesterday. Mild right V4 calcified plaque without significant stenosis. The left vertebral and left PICA remain patent despite the high-grade stenosis. There is moderate stenosis again at the left vertebrobasilar junction. Patent basilar artery with mild irregularity and no significant stenosis. Patent SCA and PCA origins. Posterior communicating arteries are diminutive or absent. Severe left PCA stenosis from the P2 segment on word. Moderate to severe right P2 stenosis with better distal right PCA enhancement. This is concordant with the MRA. Anterior circulation: Both ICA siphons are patent. Calcified plaque on the left with only mild supraclinoid segment stenosis. Right side calcified plaque and tortuosity with mild supraclinoid stenosis. Patent bilateral carotid termini. Patent MCA and ACA origins. Anterior communicating artery, bilateral ACA branches,  right MCA M1, and right MCA bifurcation are patent without stenosis. Mild to moderate right M2 and M3 branch irregularities and stenosis appear similar to the MRA. Left MCA M1 demonstrates a relatively long 6 mm segment of moderate to severe stenosis best seen on series 12, image 20. This leads up to the left MCA trifurcation which remains patent. Left MCA branches demonstrate fairly symmetric enhancement to those on the right, and only mild additional irregularity. Venous sinuses: Early contrast timing, not evaluated. Anatomic variants: Mildly dominant right vertebral artery. Review of the MIP images confirms the above findings IMPRESSION: 1. Compensated Severe Left MCA M1 stenosis. CTP is negative for infarct core or penumbra. And CT appearance of the brain is stable. 2. Other intracranial CTA findings concordant to the MRA yesterday including: - Severe Left Vertebral V4 segment stenosis. - Moderate to severe bilateral PCA stenosis worse on the left. 3. Extracranial calcified plaque resulting in bilateral carotid bifurcation stenosis affecting the ICAs: 60% on the Right and 55-60% on the Left . 4. No significant cervical vertebral artery stenosis. 5. Aortic Atherosclerosis (ICD10-I70.0) and Emphysema (ICD10-J43.9). Electronically Signed   By: Genevie Ann M.D.   On: 07/20/2019 04:20   DG CHEST PORT 1 VIEW  Result Date: 07/21/2019 CLINICAL DATA:  Cough. EXAM: PORTABLE CHEST 1 VIEW COMPARISON:  July 20, 2019 FINDINGS: The heart, hila, mediastinum, lungs, and pleura are unchanged and unremarkable. IMPRESSION: No active disease. Electronically Signed   By: Dorise Bullion III M.D   On: 07/21/2019 11:21   DG CHEST PORT 1 VIEW  Result Date: 07/20/2019 CLINICAL DATA:  Hypothermia EXAM: PORTABLE CHEST 1 VIEW COMPARISON:  07/19/2019 FINDINGS: There is hyperinflation of the lungs compatible with COPD. Heart is normal size. Aortic atherosclerosis. No confluent opacities or effusions. No acute bony abnormality. IMPRESSION:  COPD.  No active disease. Electronically Signed   By: Rolm Baptise M.D.   On: 07/20/2019 02:27   DG Chest Port 1 View  Result Date: 07/19/2019 CLINICAL DATA:  Rales EXAM: PORTABLE CHEST 1 VIEW COMPARISON:  07/17/2019 FINDINGS: Single frontal view of the chest was performed, excluding the right costophrenic angle by collimation. The cardiac silhouette is unremarkable. Stable atherosclerosis of the aortic arch. No airspace disease, effusion, or pneumothorax. No acute bony abnormalities. IMPRESSION: 1. Stable exam, no acute process. Electronically Signed   By: Randa Ngo M.D.   On: 07/19/2019 18:31   ECHOCARDIOGRAM COMPLETE  Result Date: 07/20/2019    ECHOCARDIOGRAM REPORT   Patient Name:   NITASHA JEWEL Date of Exam: 07/20/2019 Medical Rec #:  [id number]             Height:       64.0 in Accession #:  [id number]            Weight:       100.0 lb Date of Birth:  08-Mar-1927            BSA:          1.457 m Patient Age:    79 years              BP:           155/63 mmHg Patient Gender: F                     HR:           62 bpm. Exam Location:  Inpatient Procedure: 2D Echo, Cardiac Doppler and Color Doppler Indications:    TIA 435.9 / G45.9  History:        Patient has prior history of Echocardiogram examinations, most                 recent 11/16/2015. CAD, Carotid Disease; Risk                 Factors:Hypertension and Dyslipidemia. Hypothyroidism.  Sonographer:    Jonelle Sidle Dance Referring Phys: [id number] [place]  1. Left ventricular ejection fraction, by estimation, is 60 to 65%. The left ventricle has normal function. The left ventricle has no regional wall motion abnormalities. Left ventricular diastolic parameters are consistent with Grade I diastolic dysfunction (impaired relaxation). Elevated left atrial pressure.  2. Right ventricular systolic function is normal. The right ventricular size is normal. There is normal pulmonary artery systolic pressure. The estimated right  ventricular systolic pressure is 25.0 mmHg.  3. The mitral valve is normal in structure. Mild mitral valve regurgitation. No evidence of mitral stenosis.  4. Tricuspid valve regurgitation is moderate.  5. The aortic valve is normal in structure. Aortic valve regurgitation is trivial. Mild aortic valve sclerosis is present, with no evidence of aortic valve stenosis.  6. The inferior vena cava is normal in size with greater than 50% respiratory variability, suggesting right atrial pressure of 3 mmHg. FINDINGS  Left Ventricle: Left ventricular ejection fraction, by estimation, is 60 to 65%. The left ventricle has normal function. The left ventricle has no regional wall motion abnormalities. The left ventricular internal cavity size was normal in size. There is  no left ventricular hypertrophy. Left ventricular diastolic parameters are consistent with Grade I diastolic dysfunction (impaired relaxation). Elevated left atrial pressure. Right Ventricle: The right ventricular size is normal. No increase in right ventricular wall thickness. Right ventricular systolic function is normal. There is normal pulmonary artery systolic pressure. The tricuspid regurgitant velocity is 2.30 m/s, and  with an assumed right atrial pressure of 8 mmHg, the estimated right ventricular systolic pressure is 03.7 mmHg. Left Atrium: Left atrial size was normal in size. Right Atrium: Right atrial size was normal in size. Pericardium: There is no evidence of pericardial effusion. Mitral Valve: The mitral valve is normal in structure. Normal mobility of the mitral valve leaflets. Mild mitral valve regurgitation. No evidence of mitral valve stenosis. Tricuspid Valve: The tricuspid valve is normal in structure. Tricuspid valve regurgitation is moderate . No evidence of tricuspid stenosis. Aortic Valve: The aortic valve is normal in structure. Aortic valve regurgitation is trivial. Aortic regurgitation PHT measures 503 msec. Mild aortic valve  sclerosis is present, with no evidence of aortic valve stenosis. Pulmonic Valve: The pulmonic valve was normal in structure. Pulmonic valve regurgitation is not visualized.  No evidence of pulmonic stenosis. Aorta: The aortic root is normal in size and structure. Venous: The inferior vena cava is normal in size with greater than 50% respiratory variability, suggesting right atrial pressure of 3 mmHg. IAS/Shunts: No atrial level shunt detected by color flow Doppler.  LEFT VENTRICLE PLAX 2D LVIDd:         3.90 cm  Diastology LVIDs:         2.70 cm  LV e' lateral:   4.40 cm/s LV PW:         0.90 cm  LV E/e' lateral: 12.4 LV IVS:        0.80 cm  LV e' medial:    3.50 cm/s LVOT diam:     2.20 cm  LV E/e' medial:  15.5 LV SV:         67 LV SV Index:   46 LVOT Area:     3.80 cm  RIGHT VENTRICLE             IVC RV Basal diam:  3.40 cm     IVC diam: 2.20 cm RV Mid diam:    2.30 cm RV S prime:     10.90 cm/s TAPSE (M-mode): 2.5 cm LEFT ATRIUM             Index       RIGHT ATRIUM           Index LA diam:        3.10 cm 2.13 cm/m  RA Area:     15.40 cm LA Vol (A2C):   19.7 ml 13.52 ml/m RA Volume:   37.60 ml  25.80 ml/m LA Vol (A4C):   23.3 ml 15.99 ml/m LA Biplane Vol: 21.6 ml 14.82 ml/m  AORTIC VALVE LVOT Vmax:   66.60 cm/s LVOT Vmean:  43.000 cm/s LVOT VTI:    0.176 m AI PHT:      503 msec  AORTA Ao Root diam: 2.80 cm Ao Asc diam:  3.50 cm MITRAL VALVE               TRICUSPID VALVE MV Area (PHT): 2.22 cm    TR Peak grad:   21.2 mmHg MV Decel Time: 342 msec    TR Vmax:        230.00 cm/s MV E velocity: 54.40 cm/s MV A velocity: 75.80 cm/s  SHUNTS MV E/A ratio:  0.72        Systemic VTI:  0.18 m                            Systemic Diam: 2.20 cm Dani Gobble Croitoru MD Electronically signed by Sanda Klein MD Signature Date/Time: 07/20/2019/10:39:24 AM    Final    VAS US CAROTID (at [facility] and [facility] only)  Result Date: 07/20/2019 Carotid Arterial Duplex Study Indications:       TIA and Carotid artery disease. Risk Factors:       Hypertension, hyperlipidemia. Comparison Study:  03/22/17 bilateral 40-59% Performing Technologist: June Leap RDMS, RVT  Examination Guidelines: A complete evaluation includes B-mode imaging, spectral Doppler, color Doppler, and power Doppler as needed of all accessible portions of each vessel. Bilateral testing is considered an integral part of a complete examination. Limited examinations for reoccurring indications may be performed as noted.  Right Carotid Findings: +----------+--------+--------+--------+--------------------------+--------+           PSV cm/sEDV cm/sStenosisPlaque Description        Comments +----------+--------+--------+--------+--------------------------+--------+  CCA Prox  57      6                                                  +----------+--------+--------+--------+--------------------------+--------+ CCA Distal73      11                                                 +----------+--------+--------+--------+--------------------------+--------+ ICA Prox  166     33      40-59%  heterogenous and irregular         +----------+--------+--------+--------+--------------------------+--------+ ICA Distal66      19                                                 +----------+--------+--------+--------+--------------------------+--------+ ECA       152     12                                                 +----------+--------+--------+--------+--------------------------+--------+ +----------+--------+-------+----------------+-------------------+           PSV cm/sEDV cmsDescribe        Arm Pressure (mmHG) +----------+--------+-------+----------------+-------------------+ [id number]             Multiphasic, WNL                    +----------+--------+-------+----------------+-------------------+ +---------+--------+--+--------+--+---------+ VertebralPSV cm/s41EDV cm/s11Antegrade +---------+--------+--+--------+--+---------+  Left Carotid  Findings: +----------+--------+--------+--------+--------------------+-------------------+           PSV cm/sEDV cm/sStenosisPlaque Description  Comments            +----------+--------+--------+--------+--------------------+-------------------+ CCA Prox  64      9                                                       +----------+--------+--------+--------+--------------------+-------------------+ CCA Distal54      15                                                      +----------+--------+--------+--------+--------------------+-------------------+ ICA Prox  115     23      1-39%   calcific and        acoustic shadowing                                    irregular           may obscure higher  velocities          +----------+--------+--------+--------+--------------------+-------------------+ ICA Distal96      13                                                      +----------+--------+--------+--------+--------------------+-------------------+ ECA       61      3                                                       +----------+--------+--------+--------+--------------------+-------------------+ +----------+--------+--------+--------+-------------------+           PSV cm/sEDV cm/sDescribeArm Pressure (mmHG) +----------+--------+--------+--------+-------------------+ [id number]             Stenotic                    +----------+--------+--------+--------+-------------------+ +---------+--------+--+--------+--+---------+ VertebralPSV cm/s97EDV cm/s12Antegrade +---------+--------+--+--------+--+---------+   Summary: Right Carotid: Velocities in the right ICA are consistent with a 40-59%                stenosis. Left Carotid: Velocities in the left ICA are consistent with a 1-39% stenosis. Subclavians: Left subclavian artery was stenotic. *See table(s) above for measurements and  observations.  Electronically signed by Antony Contras MD on 07/20/2019 at 10:57:45 AM.    Final     Medications:   . amLODipine  10 mg Oral Daily  . aspirin EC  325 mg Oral Daily  . budesonide  1 mg Inhalation BID  . clopidogrel  75 mg Oral Daily  . ezetimibe  10 mg Oral Daily  . heparin  5,000 Units Subcutaneous Q8H  . metoprolol succinate  25 mg Oral Daily  . polyvinyl alcohol  1 drop Both Eyes QID  . umeclidinium bromide  1 puff Inhalation Daily   Continuous Infusions:   LOS: 1 day   Firmin Belisle  [facility]     07/21/2019, 12:23 PM

## 2019-07-22 LAB — BASIC METABOLIC PANEL
Anion gap: 9 (ref 5–15)
BUN: 19 mg/dL (ref 8–23)
CO2: 28 mmol/L (ref 22–32)
Calcium: 9.1 mg/dL (ref 8.9–10.3)
Chloride: 106 mmol/L (ref 98–111)
Creatinine, Ser: 0.97 mg/dL (ref 0.44–1.00)
GFR calc Af Amer: >60 mL/min (ref >60)
GFR calc non Af Amer: 52 mL/min — ABNORMAL LOW (ref >60)
Glucose, Bld: 107 mg/dL — ABNORMAL HIGH (ref 70–99)
Potassium: 3.5 mmol/L (ref 3.5–5.1)
Sodium: 143 mmol/L (ref 135–145)

## 2019-07-22 LAB — PHOSPHORUS: Phosphorus: 3.7 mg/dL (ref 2.5–4.6)

## 2019-07-22 MED ORDER — METHYLPREDNISOLONE SODIUM SUCC 40 MG IJ SOLR
40.0000 mg | Freq: Two times a day (BID) | INTRAMUSCULAR | Status: DC
  Administered 2019-07-22 – 2019-07-25 (×7): 40 mg via INTRAVENOUS
  Filled 2019-07-22 (×7): qty 1

## 2019-07-22 MED ORDER — GUAIFENESIN ER 600 MG PO TB12
600.0000 mg | ORAL_TABLET | Freq: Two times a day (BID) | ORAL | Status: DC
  Administered 2019-07-22 (×2): 600 mg via ORAL
  Filled 2019-07-22 (×2): qty 1

## 2019-07-22 NOTE — Progress Notes (Addendum)
PROGRESS NOTE    [name] [name]  ZDG:[id number] DOB: 06-18-1927 DOA: 07/19/2019 PCP: Burnis Medin, MD   Brief Narrative: Patient is a 86 year old female with history of coronary disease status post stenting to RCA in 2005, peripheral vascular disease, COPD, CKD stage IIIa, hypertension, hyperlipidemia, carotid stenosis who presented to the emergency department for the evaluation of left-sided weakness and intermittent expressive aphasia.  Initial CT head/MRI of the brain did not show any stroke.  Later in the hospital, she developed right-sided weakness.  Repeat MRI showed acute infarct in the left coronary rate and basal ganglia.  Neurology was consulted and following.  Stroke workup completed.  Neurology recommended dual antiplatelet therapy with aspirin Plavix for 3 months followed by Plavix monotherapy because of severe left MCA stenosis.  PT/OT recommending CIR versus skilled nursing facility.    Currently not stable for discharge due to severe wheezing, oxygen requirement.Family in agreement for SNF.  Assessment & Plan:   Principal Problem:   Acute ischemic left MCA stroke (HCC) Active Problems:   Hyperlipidemia   Essential hypertension, benign   Peripheral vascular disease (HCC)   COPD (chronic obstructive pulmonary disease) (HCC)   Left-sided weakness   Hypokalemia   CAD (coronary artery disease), native coronary artery   Aphasia   Cerebral thrombosis with cerebral infarction   Acute ischemic stroke: Has left MCA subcortical infarct likely due to high-grade stenosis of left M1.  MRI head initially did not show any acute brain findings.  MRA head showed 6 mm short segment severe stenosis involving the mid distal left M1 segment.  CT head and neck showed compensated severe left MCA M1 stenosis, severe left vertebral V4 segment stenosis, moderate to severe bilateral PCA stenosis.  Also showed bilateral carotid bifurcation stenosis affecting ICA: 60% on the right and 55 to 60%  on the left.  Last MRI showed multiple puncture the stroke at left basal ganglia and corona radiata.  Because of his stroke is most likely due to large vessel disease of high-grade left MCA stenosis. Neurology was consulted and was following.  Recommended aspirin 325 mg and Plavix for 3 months followed by Plavix alone.  She needs to follow-up with [facility] neurology as an outpatient. Stroke work-up completed.  Hemoglobin A1c of 5.6.  LDL of 120. Echocardiogram showed ejection fraction of 65%, no cardiac source of emboli. PT/OT recommending CIR versus skilled nursing facility.  COPD exacerbation: Has severe wheezes.  Started on Solu-Medrol.  Chest x-ray did not show pneumonia or any other acute findings.  Continue mucolytic's.  She is currently requiring 2 L of oxygen per minute.  She is not on oxygen at home.  Continue bronchodilators  Hypertension: Currently on Norvasc, Toprol, Imdur.  She is still mildly hypertensive.  Continue current medications and continue to monitor blood pressure.  Hyperlipidemia: LDL of 120.  She has a statin intolerance.  Continue Zetia.  Coronary artery disease: Status post stenting to the RCA.  Denies any chest pain.  Continue current medications  CKD stage IIIa: Chronic and appears stable.  Tobacco abuse: Currently smokes.  Smoking cessation counseled.  Patient is willing to quit.  Severe malnutrition: BMI of 17.16            DVT prophylaxis:Heparin Bruce Code Status: Full Family Communication: Discussed with granddaughter on phone Status is: Inpatient  Remains inpatient appropriate because:Unsafe d/c plan   Dispo: The patient is from: Home              Anticipated d/c  is to:SNF              Anticipated d/c date is: 2 days              Patient currently is not medically stable to d/c.  Consultants: Neurology  Procedures: None  Antimicrobials:  Anti-infectives (From admission, onward)   None      Subjective: Patient seen and examined at  the bedside this morning.  Hemodynamically stable.  Alert and oriented but having severe expiratory wheezes, coughing.  Looks very weak and debilitated. Not  stable for discharge   Objective: Vitals:   07/21/19 2000 07/21/19 2035 07/22/19 0000 07/22/19 0400  BP: (!) 138/59  (!) 144/54 (!) 155/48  Pulse: 62  63 62  Resp:      Temp: 98.3 F (36.8 C)  98.4 F (36.9 C) 98.9 F (37.2 C)  TempSrc: Oral  Axillary Oral  SpO2: 96% 98% 95% 98%  Weight:      Height:        Intake/Output Summary (Last 24 hours) at 07/22/2019 0818 Last data filed at 07/21/2019 1632 Gross per 24 hour  Intake 240 ml  Output --  Net 240 ml   Filed Weights   07/19/19 1743  Weight: 45.4 kg    Examination:  General exam: Extremely deconditioned, debilitated elderly female HEENT:PERRL,Oral mucosa moist, Ear/Nose normal on gross exam Respiratory system: Bilateral expiratory wheezes, decreased air entry  cardiovascular system: S1 & S2 heard, RRR. No JVD, murmurs, rubs, gallops or clicks. No pedal edema. Gastrointestinal system: Abdomen is nondistended, soft and nontender. No organomegaly or masses felt. Normal bowel sounds heard. Central nervous system: Alert and oriented.  Right facial droop, weakness on the right side with motor of 4/5 in right upper extremity and 4/5 in the right lower extremity. Extremities: No edema, no clubbing ,no cyanosis, distal peripheral pulses palpable. Skin: Extensive ecchymosis, no ulcers MSK: Wasting of muscles  Data Reviewed: I have personally reviewed following labs and imaging studies  CBC: Recent Labs  Lab 07/17/19 1657 07/19/19 1825 07/19/19 1918 07/20/19 0407  WBC 7.3  --  7.5 8.3  NEUTROABS  --   --  5.2  --   HGB 14.4 13.6 13.4 14.6  HCT 45.7 40.0 42.8 45.9  MCV 94.0  --  94.7 93.7  PLT 206  --  178 732   Basic Metabolic Panel: Recent Labs  Lab 07/17/19 1657 07/19/19 1755 07/19/19 1825 07/20/19 0407  NA 143 144 145 139  K 3.5 3.4* 3.4* 3.6  CL 105  108 105 104  CO2 30 26  --  26  GLUCOSE 87 90 88 114*  BUN 20 25* 30* 17  CREATININE 1.20* 1.23* 1.30* 0.90  CALCIUM 9.6 9.4  --  8.9  MG  --   --   --  1.7  PHOS  --   --   --  2.2*   GFR: Estimated Creatinine Clearance: 30.4 mL/min (by C-G formula based on SCr of 0.9 mg/dL). Liver Function Tests: Recent Labs  Lab 07/19/19 1755  AST 20  ALT 12  ALKPHOS 77  BILITOT 0.6  PROT 6.0*  ALBUMIN 3.3*   No results for input(s): LIPASE, AMYLASE in the last 168 hours. No results for input(s): AMMONIA in the last 168 hours. Coagulation Profile: Recent Labs  Lab 07/19/19 1755  INR 1.0   Cardiac Enzymes: No results for input(s): CKTOTAL, CKMB, CKMBINDEX, TROPONINI in the last 168 hours. BNP (last 3 results) No results for input(s):  PROBNP in the last 8760 hours. HbA1C: Recent Labs    07/20/19 0407  HGBA1C 5.6   CBG: No results for input(s): GLUCAP in the last 168 hours. Lipid Profile: Recent Labs    07/20/19 0407  CHOL 185  HDL 51  LDLCALC 120*  TRIG 70  CHOLHDL 3.6   Thyroid Function Tests: No results for input(s): TSH, T4TOTAL, FREET4, T3FREE, THYROIDAB in the last 72 hours. Anemia Panel: No results for input(s): VITAMINB12, FOLATE, FERRITIN, TIBC, IRON, RETICCTPCT in the last 72 hours. Sepsis Labs: No results for input(s): PROCALCITON, LATICACIDVEN in the last 168 hours.  Recent Results (from the past 240 hour(s))  SARS Coronavirus 2 by RT PCR (hospital order, performed in [facility] hospital lab) Nasopharyngeal Nasopharyngeal Swab     Status: None   Collection Time: 07/19/19 10:53 PM   Specimen: Nasopharyngeal Swab  Result Value Ref Range Status   SARS Coronavirus 2 NEGATIVE NEGATIVE Final    Comment: (NOTE) SARS-CoV-2 target nucleic acids are NOT DETECTED.  The SARS-CoV-2 RNA is generally detectable in upper and lower respiratory specimens during the acute phase of infection. The lowest concentration of SARS-CoV-2 viral copies this assay can detect is  250 copies / mL. A negative result does not preclude SARS-CoV-2 infection and should not be used as the sole basis for treatment or other patient management decisions.  A negative result may occur with improper specimen collection / handling, submission of specimen other than nasopharyngeal swab, presence of viral mutation(s) within the areas targeted by this assay, and inadequate number of viral copies (<250 copies / mL). A negative result must be combined with clinical observations, patient history, and epidemiological information.  Fact Sheet for Patients:   StrictlyIdeas.no  Fact Sheet for Healthcare Providers: BankingDealers.co.za  This test is not yet approved or  cleared by the [place] FDA and has been authorized for detection and/or diagnosis of SARS-CoV-2 by FDA under an Emergency Use Authorization (EUA).  This EUA will remain in effect (meaning this test can be used) for the duration of the COVID-19 declaration under Section 564(b)(1) of the Act, 21 U.S.C. section 360bbb-3(b)(1), unless the authorization is terminated or revoked sooner.  Performed at [facility], [place] [address] [place], [place] [address]          Radiology Studies: EEG  Result Date: 07/20/2019 Lora Havens, MD     07/20/2019  3:39 PM Patient Name: [name] [name] MRN: [id number] Epilepsy Attending: Lora Havens Referring Physician/Provider: Dr. Kerney Elbe Date: 07/20/2019 Duration: 24.12 mins Patient history: 86 year old female presenting with transient spells of expressive aphasia. EEG evaluate for seizures. Level of alertness: Awake AEDs during EEG study: None Technical aspects: This EEG study was done with scalp electrodes positioned according to the 10-20 International system of electrode placement. Electrical activity was acquired at a sampling rate of 500Hz  and reviewed with a high frequency filter of 70Hz  and a low frequency  filter of 1Hz . EEG data were recorded continuously and digitally stored. Description: The posterior dominant rhythm consists of 8 Hz activity of moderate voltage (25-35 uV) seen predominantly in posterior head regions, symmetric and reactive to eye opening and eye closing. EEG showed intermittent 2-3Hz  delta slowing in left temporal region.  Hyperventilation and photic stimulation were not performed.   ABNORMALITY -Intermittent slow, left temporal region IMPRESSION: This study is suggestive of non specific cortical dysfunction in left temporal region. No seizures or epileptiform discharges were seen throughout the recording. Priyanka Barbra Sarks  MR BRAIN WO CONTRAST  Result Date: 07/20/2019 CLINICAL DATA:  Stroke follow-up. Right facial droop and mild right hemiparesis. EXAM: MRI HEAD WITHOUT CONTRAST TECHNIQUE: Multiplanar, multiecho pulse sequences of the brain and surrounding structures were obtained without intravenous contrast. COMPARISON:  Head CT, CTA, and CTP 07/20/2019 and MRI 07/19/2019 FINDINGS: At the request of the ordering neurologist, only axial and coronal diffusion weighted imaging was obtained. There are new punctate acute infarcts involving the left corona radiata and superior left lentiform nucleus. No intracranial mass effect or extra-axial fluid collection is identified. Assessment of chronic findings including chronic small vessel ischemia in the cerebral white matter is deferred to yesterday's complete brain MRI. IMPRESSION: Punctate acute infarcts in the left corona radiata and basal ganglia. Electronically Signed   By: Logan Bores M.D.   On: 07/20/2019 20:49   DG CHEST PORT 1 VIEW  Result Date: 07/21/2019 CLINICAL DATA:  86 year old female with bilateral crackle. EXAM: PORTABLE CHEST 1 VIEW COMPARISON:  Chest radiograph dated 07/21/2019. FINDINGS: Probable trace left pleural effusion or pleural thickening with minimal left lung base atelectasis. No focal consolidation, or  pneumothorax. The cardiac silhouette is within limits. Atherosclerotic calcification of the aorta. Probable old healed left lateral rib fracture. No acute osseous pathology. Degenerative changes of the spine. Right axillary surgical clips. IMPRESSION: Probable trace left pleural effusion with minimal left lung base atelectasis. Electronically Signed   By: Anner Crete M.D.   On: 07/21/2019 21:26   DG CHEST PORT 1 VIEW  Result Date: 07/21/2019 CLINICAL DATA:  Cough. EXAM: PORTABLE CHEST 1 VIEW COMPARISON:  July 20, 2019 FINDINGS: The heart, hila, mediastinum, lungs, and pleura are unchanged and unremarkable. IMPRESSION: No active disease. Electronically Signed   By: Dorise Bullion III M.D   On: 07/21/2019 11:21   ECHOCARDIOGRAM COMPLETE  Result Date: 07/20/2019    ECHOCARDIOGRAM REPORT   Patient Name:   [name] [name] Date of Exam: 07/20/2019 Medical Rec #:  [id number]             Height:       64.0 in Accession #:    [id number]            Weight:       100.0 lb Date of Birth:  [birth year]            BSA:          1.457 m Patient Age:    89 years              BP:           155/63 mmHg Patient Gender: F                     HR:           62 bpm. Exam Location:  Inpatient Procedure: 2D Echo, Cardiac Doppler and Color Doppler Indications:    TIA 435.9 / G45.9  History:        Patient has prior history of Echocardiogram examinations, most                 recent 11/16/2015. CAD, Carotid Disease; Risk                 Factors:Hypertension and Dyslipidemia. Hypothyroidism.  Sonographer:    Jonelle Sidle Dance Referring Phys: [id number] [place]  1. Left ventricular ejection fraction, by estimation, is 60 to 65%. The left ventricle has normal function. The left ventricle has no regional wall motion  abnormalities. Left ventricular diastolic parameters are consistent with Grade I diastolic dysfunction (impaired relaxation). Elevated left atrial pressure.  2. Right ventricular systolic function is  normal. The right ventricular size is normal. There is normal pulmonary artery systolic pressure. The estimated right ventricular systolic pressure is 93.7 mmHg.  3. The mitral valve is normal in structure. Mild mitral valve regurgitation. No evidence of mitral stenosis.  4. Tricuspid valve regurgitation is moderate.  5. The aortic valve is normal in structure. Aortic valve regurgitation is trivial. Mild aortic valve sclerosis is present, with no evidence of aortic valve stenosis.  6. The inferior vena cava is normal in size with greater than 50% respiratory variability, suggesting right atrial pressure of 3 mmHg. FINDINGS  Left Ventricle: Left ventricular ejection fraction, by estimation, is 60 to 65%. The left ventricle has normal function. The left ventricle has no regional wall motion abnormalities. The left ventricular internal cavity size was normal in size. There is  no left ventricular hypertrophy. Left ventricular diastolic parameters are consistent with Grade I diastolic dysfunction (impaired relaxation). Elevated left atrial pressure. Right Ventricle: The right ventricular size is normal. No increase in right ventricular wall thickness. Right ventricular systolic function is normal. There is normal pulmonary artery systolic pressure. The tricuspid regurgitant velocity is 2.30 m/s, and  with an assumed right atrial pressure of 8 mmHg, the estimated right ventricular systolic pressure is 90.2 mmHg. Left Atrium: Left atrial size was normal in size. Right Atrium: Right atrial size was normal in size. Pericardium: There is no evidence of pericardial effusion. Mitral Valve: The mitral valve is normal in structure. Normal mobility of the mitral valve leaflets. Mild mitral valve regurgitation. No evidence of mitral valve stenosis. Tricuspid Valve: The tricuspid valve is normal in structure. Tricuspid valve regurgitation is moderate . No evidence of tricuspid stenosis. Aortic Valve: The aortic valve is normal in  structure. Aortic valve regurgitation is trivial. Aortic regurgitation PHT measures 503 msec. Mild aortic valve sclerosis is present, with no evidence of aortic valve stenosis. Pulmonic Valve: The pulmonic valve was normal in structure. Pulmonic valve regurgitation is not visualized. No evidence of pulmonic stenosis. Aorta: The aortic root is normal in size and structure. Venous: The inferior vena cava is normal in size with greater than 50% respiratory variability, suggesting right atrial pressure of 3 mmHg. IAS/Shunts: No atrial level shunt detected by color flow Doppler.  LEFT VENTRICLE PLAX 2D LVIDd:         3.90 cm  Diastology LVIDs:         2.70 cm  LV e' lateral:   4.40 cm/s LV PW:         0.90 cm  LV E/e' lateral: 12.4 LV IVS:        0.80 cm  LV e' medial:    3.50 cm/s LVOT diam:     2.20 cm  LV E/e' medial:  15.5 LV SV:         67 LV SV Index:   46 LVOT Area:     3.80 cm  RIGHT VENTRICLE             IVC RV Basal diam:  3.40 cm     IVC diam: 2.20 cm RV Mid diam:    2.30 cm RV S prime:     10.90 cm/s TAPSE (M-mode): 2.5 cm LEFT ATRIUM             Index       RIGHT ATRIUM  Index LA diam:        3.10 cm 2.13 cm/m  RA Area:     15.40 cm LA Vol (A2C):   19.7 ml 13.52 ml/m RA Volume:   37.60 ml  25.80 ml/m LA Vol (A4C):   23.3 ml 15.99 ml/m LA Biplane Vol: 21.6 ml 14.82 ml/m  AORTIC VALVE LVOT Vmax:   66.60 cm/s LVOT Vmean:  43.000 cm/s LVOT VTI:    0.176 m AI PHT:      503 msec  AORTA Ao Root diam: 2.80 cm Ao Asc diam:  3.50 cm MITRAL VALVE               TRICUSPID VALVE MV Area (PHT): 2.22 cm    TR Peak grad:   21.2 mmHg MV Decel Time: 342 msec    TR Vmax:        230.00 cm/s MV E velocity: 54.40 cm/s MV A velocity: 75.80 cm/s  SHUNTS MV E/A ratio:  0.72        Systemic VTI:  0.18 m                            Systemic Diam: 2.20 cm Dani Gobble Croitoru MD Electronically signed by Sanda Klein MD Signature Date/Time: 07/20/2019/10:39:24 AM    Final    VAS US CAROTID (at [facility] and [facility] only)  Result Date:  07/20/2019 Carotid Arterial Duplex Study Indications:       TIA and Carotid artery disease. Risk Factors:      Hypertension, hyperlipidemia. Comparison Study:  03/22/17 bilateral 40-59% Performing Technologist: June Leap RDMS, RVT  Examination Guidelines: A complete evaluation includes B-mode imaging, spectral Doppler, color Doppler, and power Doppler as needed of all accessible portions of each vessel. Bilateral testing is considered an integral part of a complete examination. Limited examinations for reoccurring indications may be performed as noted.  Right Carotid Findings: +----------+--------+--------+--------+--------------------------+--------+           PSV cm/sEDV cm/sStenosisPlaque Description        Comments +----------+--------+--------+--------+--------------------------+--------+ CCA Prox  57      6                                                  +----------+--------+--------+--------+--------------------------+--------+ CCA Distal73      11                                                 +----------+--------+--------+--------+--------------------------+--------+ ICA Prox  166     33      40-59%  heterogenous and irregular         +----------+--------+--------+--------+--------------------------+--------+ ICA Distal66      19                                                 +----------+--------+--------+--------+--------------------------+--------+ ECA       152     12                                                 +----------+--------+--------+--------+--------------------------+--------+ +----------+--------+-------+----------------+-------------------+  PSV cm/sEDV cmsDescribe        Arm Pressure (mmHG) +----------+--------+-------+----------------+-------------------+ [id number]             Multiphasic, WNL                    +----------+--------+-------+----------------+-------------------+  +---------+--------+--+--------+--+---------+ VertebralPSV cm/s41EDV cm/s11Antegrade +---------+--------+--+--------+--+---------+  Left Carotid Findings: +----------+--------+--------+--------+--------------------+-------------------+           PSV cm/sEDV cm/sStenosisPlaque Description  Comments            +----------+--------+--------+--------+--------------------+-------------------+ CCA Prox  64      9                                                       +----------+--------+--------+--------+--------------------+-------------------+ CCA Distal54      15                                                      +----------+--------+--------+--------+--------------------+-------------------+ ICA Prox  115     23      1-39%   calcific and        acoustic shadowing                                    irregular           may obscure higher                                                        velocities          +----------+--------+--------+--------+--------------------+-------------------+ ICA Distal96      13                                                      +----------+--------+--------+--------+--------------------+-------------------+ ECA       61      3                                                       +----------+--------+--------+--------+--------------------+-------------------+ +----------+--------+--------+--------+-------------------+           PSV cm/sEDV cm/sDescribeArm Pressure (mmHG) +----------+--------+--------+--------+-------------------+ [id number]             Stenotic                    +----------+--------+--------+--------+-------------------+ +---------+--------+--+--------+--+---------+ VertebralPSV cm/s97EDV cm/s12Antegrade +---------+--------+--+--------+--+---------+   Summary: Right Carotid: Velocities in the right ICA are consistent with a 40-59%                stenosis. Left Carotid: Velocities  in the left ICA are consistent with a 1-39% stenosis. Subclavians: Left subclavian artery was stenotic. *See table(s) above for  measurements and observations.  Electronically signed by Antony Contras MD on 07/20/2019 at 10:57:45 AM.    Final         Scheduled Meds: . amLODipine  10 mg Oral Daily  . aspirin EC  325 mg Oral Daily  . budesonide  1 mg Inhalation BID  . clopidogrel  75 mg Oral Daily  . ezetimibe  10 mg Oral Daily  . guaiFENesin  600 mg Oral BID  . heparin  5,000 Units Subcutaneous Q8H  . methylPREDNISolone (SOLU-MEDROL) injection  40 mg Intravenous Q12H  . metoprolol succinate  25 mg Oral Daily  . polyvinyl alcohol  1 drop Both Eyes QID  . umeclidinium bromide  1 puff Inhalation Daily   Continuous Infusions:   LOS: 2 days    Time spent: 35 mins,More than 50% of that time was spent in counseling and/or coordination of care.      Shelly Coss, MD [facility] P6/20/2021, 8:18 AM

## 2019-07-23 MED ORDER — AZITHROMYCIN 500 MG PO TABS
500.0000 mg | ORAL_TABLET | Freq: Every day | ORAL | Status: AC
  Administered 2019-07-23 – 2019-07-25 (×3): 500 mg via ORAL
  Filled 2019-07-23 (×3): qty 1

## 2019-07-23 MED ORDER — ISOSORBIDE MONONITRATE ER 30 MG PO TB24
30.0000 mg | ORAL_TABLET | Freq: Every day | ORAL | Status: DC
  Administered 2019-07-23 – 2019-07-30 (×8): 30 mg via ORAL
  Filled 2019-07-23 (×8): qty 1

## 2019-07-23 MED ORDER — GUAIFENESIN ER 600 MG PO TB12
1200.0000 mg | ORAL_TABLET | Freq: Two times a day (BID) | ORAL | Status: DC
  Administered 2019-07-23 – 2019-07-30 (×15): 1200 mg via ORAL
  Filled 2019-07-23 (×16): qty 2

## 2019-07-23 NOTE — Progress Notes (Signed)
Physical Therapy Treatment Patient Details Name: [name] [name] MRN: [id number] DOB: 09/04/1927 Today's Date: 07/23/2019    History of Present Illness Pt is an 86 y/o female who presents with weakness and aphasia. 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    PT Comments    Pt with further decline in function since initial PT eval and repeat MRI revealed multiple acute infarcts. Pt now requiring +2 total assist for functional transfers and R side appeared flaccid. No active movement noted and no muscle initiation noted with assessment of extremities. Pt with heavy R lateral lean, and required pillows behind R shoulder and under BUE's to promote a more midline position of her trunk in the chair. Due to decline, no longer feel CIR is appropriate, and will keep recommendation for SNF level follow-up at d/c.     Follow Up Recommendations  SNF;Supervision/Assistance - 24 hour     Equipment Recommendations  Other (comment) (TBD by next venue of care)    Recommendations for Other Services       Precautions / Restrictions Precautions Precautions: Fall Restrictions Weight Bearing Restrictions: No    Mobility  Bed Mobility Overal bed mobility: Needs Assistance Bed Mobility: Rolling;Sidelying to Sit Rolling: Max assist;+2 for physical assistance Sidelying to sit: Total assist;+2 for physical assistance       General bed mobility comments: Multimodal cues to attend to the R side and look past midline at the railing. Hand-over-hand assist to reach for R railing with L hand. +2 total assist for transition into sidelying and elevate trunk to full sitting position.   Transfers Overall transfer level: Needs assistance Equipment used: 2 person hand held assist Transfers: Squat Pivot Transfers     Squat pivot transfers: Total assist;+2 physical assistance     General transfer comment:  Bed pad utilized to cradle hips for squat pivot around to recliner.   Ambulation/Gait             General Gait Details: Unable   Stairs             Wheelchair Mobility    Modified Rankin (Stroke Patients Only) Modified Rankin (Stroke Patients Only) Pre-Morbid Rankin Score: No symptoms Modified Rankin: Severe disability     Balance Overall balance assessment: Needs assistance Sitting-balance support: Feet supported;Single extremity supported Sitting balance-Leahy Scale: Zero Sitting balance - Comments: Max assist required to maintain upright posture and to prevent R lateral lean   Standing balance support: Bilateral upper extremity supported Standing balance-Leahy Scale: Zero Standing balance comment: +2 total assist for transition bed>chair. Unable to achieve full stand.                             Cognition Arousal/Alertness: Awake/alert Behavior During Therapy: Flat affect Overall Cognitive Status: Impaired/Different from baseline Area of Impairment: Orientation;Attention;Memory;Following commands;Safety/judgement;Awareness;Problem solving                 Orientation Level:  (Not answering orientation questions) Current Attention Level: Focused Memory: Decreased short-term memory (Didn't remember working with therapy on evaluation) Following Commands: Follows one step commands inconsistently;Follows one step commands with increased time Safety/Judgement: Decreased awareness of safety Awareness: Intellectual Problem Solving: Slow processing;Decreased initiation;Difficulty sequencing;Requires verbal cues;Requires tactile cues        Exercises      General Comments        Pertinent Vitals/Pain Pain Assessment: Faces Faces  Pain Scale: Hurts even more Pain Location: RLE with passive movement Pain Descriptors / Indicators: Grimacing;Moaning Pain Intervention(s): Limited activity within patient's tolerance;Monitored during  session;Repositioned    Home Living                      Prior Function            PT Goals (current goals can now be found in the care plan section) Acute Rehab PT Goals Patient Stated Goal: Get back to talking normal PT Goal Formulation: With patient Time For Goal Achievement: 07/27/19 Potential to Achieve Goals: Good Progress towards PT goals: Progressing toward goals    Frequency    Min 3X/week      PT Plan Current plan remains appropriate    Co-evaluation              AM-PAC PT "6 Clicks" Mobility   Outcome Measure  Help needed turning from your back to your side while in a flat bed without using bedrails?: A Lot Help needed moving from lying on your back to sitting on the side of a flat bed without using bedrails?: Total Help needed moving to and from a bed to a chair (including a wheelchair)?: Total Help needed standing up from a chair using your arms (e.g., wheelchair or bedside chair)?: Total Help needed to walk in hospital room?: Total Help needed climbing 3-5 steps with a railing? : Total 6 Click Score: 7    End of Session Equipment Utilized During Treatment: Gait belt Activity Tolerance: Patient tolerated treatment well Patient left: in chair;with call bell/phone within reach;with chair alarm set Nurse Communication: Mobility status;Need for lift equipment (RN present to assist with transfer) PT Visit Diagnosis: Unsteadiness on feet (R26.81);Other symptoms and signs involving the nervous system (G25.638)     Time: 9373-4287 PT Time Calculation (min) (ACUTE ONLY): 23 min  Charges:  $Gait Training: 23-37 mins                     [name] [name], PT, DPT Acute Rehabilitation Services Pager: [phone] Office: [phone]    [name] [name] 07/23/2019, 2:38 PM

## 2019-07-23 NOTE — Consult Note (Signed)
Physical Medicine and Rehabilitation Consult   Reason for Consult: Stroke with functional deficits.  Referring Physician: Dr. Tawanna Solo.    HPI: [name] [name] is a 86 y.o. female with history o f CAD, DJD, COPD, CKD who was admitted on 07/19/19 with 2-3 day history of left sided weakness and expressive aphasia that was waxing and waning. CTA head/perfusion showed compensated severe L-MCA M1 stenosis, severe L-VA V4 segment stenosis, 60% R-CAS and 55-60% L-CAS. No core infarct or penumbra. MRI brain negative but patient with persistent right facial weakness with mild right hemiparesis and Dr. Erlinda Hong expressed concerns of MCA stroke due to high grade stenosis. EEG negative for seizures. MRI brain repeated 6/18 and revealed acute punctate infarcts in left corona radiata and basal ganglia. 2D echo showed EF 60-65% with mild MVR and aortic sclerosis.  Neurology recommends DAPT X 3 months followed by Plavix alone.  Long term SBP goal 130-150 range due to MCA stenosis. She has had issues with hypoxia and wheezing due to COPD exacerbation and treated with solumedrol and supplemental oxygen. Therapy ongoing and patient reported to have worsening of right sided weakness-->now flaccid with significant deficits. Rehab recommended due to functional deficits.    Review of Systems  Unable to perform ROS: Mental acuity      Past Medical History:  Diagnosis Date  . CARCINOMA, BASAL CELL 02/13/2006  . CAROTID ARTERY DISEASE 10/03/2006   Carotid US 11/17: Stable 40-59% bilateral ICA stenosis >> f/u 1 year  . CHRONIC OBSTRUCTIVE PULMONARY DISEASE, ACUTE EXACERBATION 11/17/2009  . Chronic rhinitis 12/13/2006  . CORONARY ARTERY DISEASE 02/13/2006  . DEGENERATIVE JOINT DISEASE 09/27/2006  . DERMATITIS 09/25/2007  . Head trauma    closed  . HYPERLIPIDEMIA 03/10/2008  . HYPERTENSION 02/13/2006  . HYPOTHYROIDISM 01/30/2007  . LIVER FUNCTION TESTS, ABNORMAL 01/22/2009  . LOSS, HEARING NOS 08/25/2006  .  Near syncope 09/18/2014  . NEOPLASM, SKIN, UNCERTAIN BEHAVIOR 85/03/7739  . NEPHROLITHIASIS, HX OF 02/27/2010  . OSTEOARTHRITIS, HAND 12/16/2008  . PERIPHERAL VASCULAR DISEASE 02/13/2006  . Personal history of malignant neoplasm of breast 07/24/2008  . Personal history of urinary calculi   . Post-traumatic wound infection 12/01/2010   Mild streaking superiorly and on one day of septra  Will give rocephin and add keflex  adn follow up   . POSTHERPETIC NEURALGIA 08/25/2006  . RENAL ARTERY STENOSIS 08/01/2008  . Shingles    recurrent  . SPINAL STENOSIS 11/01/2006  . THROMBOCYTOPENIA 07/30/2008  . UNS ADVRS EFF UNS RX MEDICINAL&BIOLOGICAL SBSTNC 01/30/2007  . UNSPECIFIED ANEMIA 03/18/2008  . Unspecified vitamin D deficiency 02/07/2007  . UNSTABLE ANGINA 12/12/2009  . Vitreous detachment     Past Surgical History:  Procedure Laterality Date  . ABDOMINAL HYSTERECTOMY    . BREAST LUMPECTOMY    . BREAST LUMPECTOMY  1998  . CATARACT EXTRACTION    . CORONARY ANGIOPLASTY WITH STENT PLACEMENT     bilat iliac stents, left renal artery and rt coronary artery last myoview 8/09 with EF 70%  . LUMBAR LAMINECTOMY    . SKIN BIOPSY  08/24/2017   Invasive squamous cell carcinoma  . SKIN CANCER EXCISION    . TONSILLECTOMY      Family History  Problem Relation Age of Onset  . Breast cancer Daughter   . Tuberculosis Father     Social History: Lives alone. Was independent with cane PTA. She reports that she has been smoking cigarettes. She has a 30.00 pack-year smoking history. She has never  used smokeless tobacco. She reports that she does not drink alcohol and does not use drugs.   Allergies  Allergen Reactions  . Medrol [Methylprednisolone] Other (See Comments)    Reported to have hallucinations by daughter after 2 doses of Medrol has been able to take prednisone this was in the setting of pneumonia  . Statins Other (See Comments)    Elevated liver enzymes  . Oxycodone Anxiety and Other (See  Comments)    Also states, "It makes me feel like a zombie"  . Albuterol Other (See Comments)    Shakes   . Hydrocodone-Acetaminophen Nausea Only    Dizziness  . Tequin Other (See Comments)    Patient doesn't recall   Medications Prior to Admission  Medication Sig Dispense Refill  . amLODipine (NORVASC) 10 MG tablet Take 1 tablet (10 mg total) by mouth daily. 90 tablet 2  . aspirin 81 MG tablet Take 81 mg by mouth daily.    . carboxymethylcellulose (REFRESH PLUS) 0.5 % SOLN Place 1 drop into both eyes 4 (four) times daily.    . fluticasone (FLOVENT HFA) 220 MCG/ACT inhaler Inhale 2 puffs into the lungs 2 (two) times daily. 1 Inhaler 2  . isosorbide mononitrate (IMDUR) 30 MG 24 hr tablet TAKE 1 TABLET BY MOUTH DAILY (Patient taking differently: Take 30 mg by mouth daily. ) 90 tablet 2  . levalbuterol (XOPENEX HFA) 45 MCG/ACT inhaler INHALE 2 PUFFS BY MOUTH EVERY 6 HOURS AS NEEDED FOR WHEEZING (Patient taking differently: Inhale 2 puffs into the lungs every 6 (six) hours as needed for wheezing. ) 45 g 4  . metoprolol succinate (TOPROL-XL) 25 MG 24 hr tablet TAKE 1 TABLET(25 MG) BY MOUTH DAILY 90 tablet 2  . tiotropium (SPIRIVA) 18 MCG inhalation capsule Place 18 mcg into inhaler and inhale daily.    . traMADol (ULTRAM) 50 MG tablet Take 50 mg by mouth every 6 (six) hours as needed for moderate pain.   0  . nitroGLYCERIN (NITROSTAT) 0.4 MG SL tablet Place 1 tablet (0.4 mg total) under the tongue every 5 (five) minutes as needed for chest pain. 25 tablet 12    Home: Home Living Family/patient expects to be discharged to:: Private residence Living Arrangements: Alone Available Help at Discharge: Family, Available 24 hours/day, Other (Comment) ("almost 24 hours/day") Type of Home: House Home Layout: One level Bathroom Shower/Tub: Tub/shower unit Home Equipment: Environmental consultant - 2 wheels, Cane - single point, Tub bench  Functional History: Prior Function Level of Independence: Independent with  assistive device(s) Comments: Pt reports she uses a cane to walk down to the mailbox Functional Status:  Mobility: Bed Mobility Overal bed mobility: Needs Assistance Bed Mobility: Rolling, Sidelying to Sit Rolling: Max assist, +2 for physical assistance Sidelying to sit: Total assist, +2 for physical assistance Supine to sit: Min assist General bed mobility comments: Multimodal cues to attend to the R side and look past midline at the railing. Hand-over-hand assist to reach for R railing with L hand. +2 total assist for transition into sidelying and elevate trunk to full sitting position.  Transfers Overall transfer level: Needs assistance Equipment used: 2 person hand held assist Transfers: Squat Pivot Transfers Sit to Stand: Mod assist, +2 physical assistance Stand pivot transfers: Max assist, +2 physical assistance Squat pivot transfers: Total assist, +2 physical assistance General transfer comment: Bed pad utilized to cradle hips for squat pivot around to recliner.  Ambulation/Gait General Gait Details: Unable    ADL: ADL Overall ADL's : Needs  assistance/impaired Eating/Feeding: Minimal assistance Grooming: Minimal assistance, Sitting Upper Body Bathing: Minimal assistance, Sitting Lower Body Bathing: Maximal assistance, Sitting/lateral leans Upper Body Dressing : Minimal assistance, Sitting Lower Body Dressing: Maximal assistance, Sitting/lateral leans Toilet Transfer: Maximal assistance, +2 for physical assistance, Stand-pivot, BSC Functional mobility during ADLs: Maximal assistance, +2 for physical assistance  Cognition: Cognition Overall Cognitive Status: Impaired/Different from baseline Orientation Level: Oriented X4 Cognition Arousal/Alertness: Awake/alert Behavior During Therapy: Flat affect Overall Cognitive Status: Impaired/Different from baseline Area of Impairment: Orientation, Attention, Memory, Following commands, Safety/judgement, Awareness, Problem  solving Orientation Level:  (Not answering orientation questions) Current Attention Level: Focused Memory: Decreased short-term memory (Didn't remember working with therapy on evaluation) Following Commands: Follows one step commands inconsistently, Follows one step commands with increased time Safety/Judgement: Decreased awareness of safety Awareness: Intellectual Problem Solving: Slow processing, Decreased initiation, Difficulty sequencing, Requires verbal cues, Requires tactile cues General Comments: Patient processing slowly, following simple commands inconsistently.  Patient tearful on and off throughout session.   Blood pressure (!) 97/50, pulse 64, temperature 97.6 F (36.4 C), temperature source Oral, resp. rate 18, height 5\' 4"  (1.626 m), weight 45.4 kg, SpO2 92 %. Physical Exam  Nursing note and vitals reviewed. Constitutional: She appears ill.  HENT:  Head: Normocephalic and atraumatic.  Eyes: Pupils are equal, round, and reactive to light. Conjunctivae are normal.  Cardiovascular: Normal rate, regular rhythm and normal heart sounds.  No murmur heard. Respiratory: Effort normal and breath sounds normal. No stridor. No respiratory distress. She has no wheezes.  GI: Soft. Bowel sounds are normal. She exhibits no distension and no mass.  Musculoskeletal:        General: No tenderness.     Comments: No pain with right upper limb right lower limb left upper limb or left lower limb range of motion  Neurological: She is alert.  Right inattention with right sided weakness.  0/5 right deltoid bicep tricep grip 0/5 right hip flexor knee extensor ankle dorsiflexor 4/5 left deltoid bicep tricep grip 3+ left hip flexor knee extensor ankle dorsiflexor No withdrawal pinch in the right upper or right lower limb Right neglect noted, does not cooperate with visual field testing  Skin: Skin is warm and dry.  Aphasic, nonverbal except for incomprehensible single words, difficulty following  simple commands No results found for this or any previous visit (from the past 24 hour(s)). DG CHEST PORT 1 VIEW  Result Date: 07/21/2019 CLINICAL DATA:  86 year old female with bilateral crackle. EXAM: PORTABLE CHEST 1 VIEW COMPARISON:  Chest radiograph dated 07/21/2019. FINDINGS: Probable trace left pleural effusion or pleural thickening with minimal left lung base atelectasis. No focal consolidation, or pneumothorax. The cardiac silhouette is within limits. Atherosclerotic calcification of the aorta. Probable old healed left lateral rib fracture. No acute osseous pathology. Degenerative changes of the spine. Right axillary surgical clips. IMPRESSION: Probable trace left pleural effusion with minimal left lung base atelectasis. Electronically Signed   By: Anner Crete M.D.   On: 07/21/2019 21:26     Assessment/Plan: Diagnosis: Left MCA distribution infarct with corona radiata and basal ganglia infarcts on the left side with right hemiparesis and aphasia as well as right-sided neglect 1. Does the need for close, 24 hr/day medical supervision in concert with the patient's rehab needs make it unreasonable for this patient to be served in a less intensive setting? Yes 2. Co-Morbidities requiring supervision/potential complications: Degenerative joint disease, question moderate spinal stenosis, COPD, chronic kidney disease, coronary artery disease. 3. Due to bladder management, bowel  management, safety, skin/wound care, disease management, medication administration, pain management and patient education, does the patient require 24 hr/day rehab nursing? Yes 4. Does the patient require coordinated care of a physician, rehab nurse, therapy disciplines of PT, OT, speech to address physical and functional deficits in the context of the above medical diagnosis(es)? Yes Addressing deficits in the following areas: balance, endurance, locomotion, strength, transferring, bowel/bladder control, bathing,  dressing, feeding, grooming, toileting, cognition, speech, language, swallowing and psychosocial support 5. Can the patient actively participate in an intensive therapy program of at least 3 hrs of therapy per day at least 5 days per week? No 6. The potential for patient to make measurable gains while on inpatient rehab is Limited secondary to endurance, frailty 7. Anticipated functional outcomes upon discharge from inpatient rehab are n/a  with PT, n/a with OT, n/a with SLP. 8. Estimated rehab length of stay to reach the above functional goals is: NA 9. Anticipated discharge destination: Home 10. Overall Rehab/Functional Prognosis: fair  RECOMMENDATIONS: This patient's condition is appropriate for continued rehabilitative care in the following setting: SNF Patient has agreed to participate in recommended program. N/A Note that insurance prior authorization may be required for reimbursement for recommended care.  Comment: At this point patient lacks the endurance to participate in a comprehensive intensive rehab program.  Should tolerate SNF level rehab  Bary Leriche, PA-C 07/23/2019  "I have personally performed a face to face diagnostic evaluation of this patient.  Additionally, I have reviewed and concur with the physician assistant's documentation above." Charlett Blake M.D. [facility] FAAPM&R (Neuromuscular Med) Diplomate Am Board of Electrodiagnostic Med Fellow Am Board of Interventional Pain

## 2019-07-23 NOTE — Progress Notes (Signed)
SLP Cancellation Note  Patient Details Name: [name] [name] MRN: [id number] DOB: [birth year]   Cancelled treatment:       Reason Eval/Treat Not Completed: Patient at procedure or test/unavailable (Pt with MD at this time. SLP will f/u later as able. )  Tobie Poet I. Hardin Negus, [place], [place] Office number [phone] Pager [place] 07/23/2019, 3:44 PM

## 2019-07-23 NOTE — Progress Notes (Signed)
PROGRESS NOTE    [name] [name]  HYW:[id number] DOB: Jan 20, 1928 DOA: 07/19/2019 PCP: Burnis Medin, MD   Brief Narrative: Patient is a 86 year old female with history of coronary disease status post stenting to RCA in 2005, peripheral vascular disease, COPD, CKD stage IIIa, hypertension, hyperlipidemia, carotid stenosis who presented to the emergency department for the evaluation of left-sided weakness and intermittent expressive aphasia.  Initial CT head/MRI of the brain did not show any stroke.  Later in the hospital, she developed right-sided weakness.  Repeat MRI showed acute infarct in the left coronary rate and basal ganglia.  Neurology was consulted and following.  Stroke workup completed.  Neurology recommended dual antiplatelet therapy with aspirin Plavix for 3 months followed by Plavix monotherapy because of severe left MCA stenosis.  PT/OT recommending CIR versus skilled nursing facility.    Assessment & Plan:   Principal Problem:   Acute ischemic left MCA stroke (HCC) Active Problems:   Hyperlipidemia   Essential hypertension, benign   Peripheral vascular disease (HCC)   COPD (chronic obstructive pulmonary disease) (HCC)   Left-sided weakness   Hypokalemia   CAD (coronary artery disease), native coronary artery   Aphasia   Cerebral thrombosis with cerebral infarction   Acute ischemic stroke: Has left MCA subcortical infarct likely due to high-grade stenosis of left M1.  MRI head initially did not show any acute brain findings.  MRA head showed 6 mm short segment severe stenosis involving the mid distal left M1 segment.  CT head and neck showed compensated severe left MCA M1 stenosis, severe left vertebral V4 segment stenosis, moderate to severe bilateral PCA stenosis.  Also showed bilateral carotid bifurcation stenosis affecting ICA: 60% on the right and 55 to 60% on the left.  Last MRI showed multiple puncture the stroke at left basal ganglia and corona radiata.   Because of his stroke is most likely due to large vessel disease of high-grade left MCA stenosis. Neurology was consulted and was following.  Recommended aspirin 325 mg and Plavix for 3 months followed by Plavix alone.  She needs to follow-up with [facility] neurology as an outpatient. Stroke work-up completed.  Hemoglobin A1c of 5.6.  LDL of 120. Echocardiogram showed ejection fraction of 65%, no cardiac source of emboli. PT/OT recommending CIR versus skilled nursing facility.  COPD exacerbation:   Started on Solu-Medrol and azithromycin.  Chest x-ray did not show pneumonia or any other acute findings.  Continue mucolytic's.  She is not on oxygen at home.  Continue bronchodilators.She was on room air this morning  Hypertension: Currently on Norvasc, Toprol, Imdur.  She is still mildly hypertensive.  Continue current medications and continue to monitor blood pressure.  Hyperlipidemia: LDL of 120.  She has a statin intolerance.  Continue Zetia.  Coronary artery disease: Status post stenting to the RCA.  Denies any chest pain.  Continue current medications  CKD stage IIIa: Chronic and appears stable.  Tobacco abuse: Currently smokes.  Smoking cessation counseled.  Patient is willing to quit.  Severe malnutrition: BMI of 17.16            DVT prophylaxis:Heparin Winfred Code Status: Full Family Communication: Discussed with granddaughter on phone on 07/22/19 Status is: Inpatient  Remains inpatient appropriate because:Unsafe d/c plan   Dispo: The patient is from: Home              Anticipated d/c is to:SNF              Anticipated d/c date is:  2 days       Consultants: Neurology  Procedures: None  Antimicrobials:  Anti-infectives (From admission, onward)   None      Subjective: Patient seen and examined at the bedside this morning.  Hemodynamically stable.  Today she was on room air.  Looks much better than yesterday.  she still has some rhonchi but certainly has improved since  yesterday.  Objective: Vitals:   07/22/19 1952 07/22/19 2046 07/23/19 0000 07/23/19 0726  BP: (!) 134/50  139/67 (!) 168/70  Pulse: 62 70 61 69  Resp:  (!) 22  18  Temp: 98.2 F (36.8 C)  98.3 F (36.8 C) 97.8 F (36.6 C)  TempSrc: Oral  Oral Oral  SpO2: 94% 95% 94% 93%  Weight:      Height:        Intake/Output Summary (Last 24 hours) at 07/23/2019 0747 Last data filed at 07/22/2019 1001 Gross per 24 hour  Intake --  Output 650 ml  Net -650 ml   Filed Weights   07/19/19 1743  Weight: 45.4 kg    Examination:  General exam: Extremely deconditioned, debilitated elderly female HEENT:PERRL,Oral mucosa moist, Ear/Nose normal on gross exam Respiratory system: Bilateral coarse breathing sounds, rhonchi  cardiovascular system: S1 & S2 heard, RRR. No JVD, murmurs, rubs, gallops or clicks. Gastrointestinal system: Abdomen is nondistended, soft and nontender. No organomegaly or masses felt. Normal bowel sounds heard. Central nervous system: Alert and oriented.  Right facial droop, weakness on the right side with motor of 4/5 on right upper and lower extremities Extremities: No edema, no clubbing ,no cyanosi Skin: Skin breakdowns, ecchymosis   Data Reviewed: I have personally reviewed following labs and imaging studies  CBC: Recent Labs  Lab 07/17/19 1657 07/19/19 1825 07/19/19 1918 07/20/19 0407  WBC 7.3  --  7.5 8.3  NEUTROABS  --   --  5.2  --   HGB 14.4 13.6 13.4 14.6  HCT 45.7 40.0 42.8 45.9  MCV 94.0  --  94.7 93.7  PLT 206  --  178 509   Basic Metabolic Panel: Recent Labs  Lab 07/17/19 1657 07/19/19 1755 07/19/19 1825 07/20/19 0407 07/22/19 0841  NA 143 144 145 139 143  K 3.5 3.4* 3.4* 3.6 3.5  CL 105 108 105 104 106  CO2 30 26  --  26 28  GLUCOSE 87 90 88 114* 107*  BUN 20 25* 30* 17 19  CREATININE 1.20* 1.23* 1.30* 0.90 0.97  CALCIUM 9.6 9.4  --  8.9 9.1  MG  --   --   --  1.7  --   PHOS  --   --   --  2.2* 3.7   GFR: Estimated Creatinine  Clearance: 28.2 mL/min (by C-G formula based on SCr of 0.97 mg/dL). Liver Function Tests: Recent Labs  Lab 07/19/19 1755  AST 20  ALT 12  ALKPHOS 77  BILITOT 0.6  PROT 6.0*  ALBUMIN 3.3*   No results for input(s): LIPASE, AMYLASE in the last 168 hours. No results for input(s): AMMONIA in the last 168 hours. Coagulation Profile: Recent Labs  Lab 07/19/19 1755  INR 1.0   Cardiac Enzymes: No results for input(s): CKTOTAL, CKMB, CKMBINDEX, TROPONINI in the last 168 hours. BNP (last 3 results) No results for input(s): PROBNP in the last 8760 hours. HbA1C: No results for input(s): HGBA1C in the last 72 hours. CBG: No results for input(s): GLUCAP in the last 168 hours. Lipid Profile: No results for input(s): CHOL,  HDL, LDLCALC, TRIG, CHOLHDL, LDLDIRECT in the last 72 hours. Thyroid Function Tests: No results for input(s): TSH, T4TOTAL, FREET4, T3FREE, THYROIDAB in the last 72 hours. Anemia Panel: No results for input(s): VITAMINB12, FOLATE, FERRITIN, TIBC, IRON, RETICCTPCT in the last 72 hours. Sepsis Labs: No results for input(s): PROCALCITON, LATICACIDVEN in the last 168 hours.  Recent Results (from the past 240 hour(s))  SARS Coronavirus 2 by RT PCR (hospital order, performed in [facility] hospital lab) Nasopharyngeal Nasopharyngeal Swab     Status: None   Collection Time: 07/19/19 10:53 PM   Specimen: Nasopharyngeal Swab  Result Value Ref Range Status   SARS Coronavirus 2 NEGATIVE NEGATIVE Final    Comment: (NOTE) SARS-CoV-2 target nucleic acids are NOT DETECTED.  The SARS-CoV-2 RNA is generally detectable in upper and lower respiratory specimens during the acute phase of infection. The lowest concentration of SARS-CoV-2 viral copies this assay can detect is 250 copies / mL. A negative result does not preclude SARS-CoV-2 infection and should not be used as the sole basis for treatment or other patient management decisions.  A negative result may occur with improper  specimen collection / handling, submission of specimen other than nasopharyngeal swab, presence of viral mutation(s) within the areas targeted by this assay, and inadequate number of viral copies (<250 copies / mL). A negative result must be combined with clinical observations, patient history, and epidemiological information.  Fact Sheet for Patients:   StrictlyIdeas.no  Fact Sheet for Healthcare Providers: BankingDealers.co.za  This test is not yet approved or  cleared by the [place] FDA and has been authorized for detection and/or diagnosis of SARS-CoV-2 by FDA under an Emergency Use Authorization (EUA).  This EUA will remain in effect (meaning this test can be used) for the duration of the COVID-19 declaration under Section 564(b)(1) of the Act, 21 U.S.C. section 360bbb-3(b)(1), unless the authorization is terminated or revoked sooner.  Performed at [facility], [place] [address] [place],  [address]          Radiology Studies: DG CHEST PORT 1 VIEW  Result Date: 07/21/2019 CLINICAL DATA:  86 year old female with bilateral crackle. EXAM: PORTABLE CHEST 1 VIEW COMPARISON:  Chest radiograph dated 07/21/2019. FINDINGS: Probable trace left pleural effusion or pleural thickening with minimal left lung base atelectasis. No focal consolidation, or pneumothorax. The cardiac silhouette is within limits. Atherosclerotic calcification of the aorta. Probable old healed left lateral rib fracture. No acute osseous pathology. Degenerative changes of the spine. Right axillary surgical clips. IMPRESSION: Probable trace left pleural effusion with minimal left lung base atelectasis. Electronically Signed   By: Anner Crete M.D.   On: 07/21/2019 21:26   DG CHEST PORT 1 VIEW  Result Date: 07/21/2019 CLINICAL DATA:  Cough. EXAM: PORTABLE CHEST 1 VIEW COMPARISON:  July 20, 2019 FINDINGS: The heart, hila, mediastinum, lungs, and pleura  are unchanged and unremarkable. IMPRESSION: No active disease. Electronically Signed   By: Dorise Bullion III M.D   On: 07/21/2019 11:21        Scheduled Meds: . amLODipine  10 mg Oral Daily  . aspirin EC  325 mg Oral Daily  . budesonide  1 mg Inhalation BID  . clopidogrel  75 mg Oral Daily  . ezetimibe  10 mg Oral Daily  . guaiFENesin  600 mg Oral BID  . heparin  5,000 Units Subcutaneous Q8H  . methylPREDNISolone (SOLU-MEDROL) injection  40 mg Intravenous Q12H  . metoprolol succinate  25 mg Oral Daily  .  polyvinyl alcohol  1 drop Both Eyes QID  . umeclidinium bromide  1 puff Inhalation Daily   Continuous Infusions:   LOS: 3 days    Time spent: 35 mins,More than 50% of that time was spent in counseling and/or coordination of care.      Shelly Coss, MD [facility] P6/21/2021, 7:47 AM

## 2019-07-24 ENCOUNTER — Inpatient Hospital Stay (HOSPITAL_COMMUNITY): Payer: Medicare HMO

## 2019-07-24 MED ORDER — GLYCOPYRROLATE 1 MG PO TABS
1.0000 mg | ORAL_TABLET | Freq: Two times a day (BID) | ORAL | Status: DC
  Administered 2019-07-24 (×2): 1 mg via ORAL
  Filled 2019-07-24 (×3): qty 1

## 2019-07-24 MED ORDER — RESOURCE THICKENUP CLEAR PO POWD
Freq: Once | ORAL | Status: AC
  Filled 2019-07-24: qty 125

## 2019-07-24 NOTE — NC FL2 (Signed)
[place] MEDICAID FL2 LEVEL OF CARE SCREENING TOOL     IDENTIFICATION  Patient Name: [name] [name] Birthdate: 05/05/1927 Sex: female Admission Date (Current Location): 07/19/2019  [facility] and [place] Number:  Herbalist and Address:  [facility] [place], [place] [address] [place], [place] [address]      Provider Number: [id number]  Attending Physician Name and Address:  Shelly Coss, MD  Relative Name and Phone Number:       Current Level of Care: Hospital Recommended Level of Care: [place] Prior Approval Number:    Date Approved/Denied:   PASRR Number: [id number]  Discharge Plan: SNF    Current Diagnoses: Patient Active Problem List   Diagnosis Date Noted  . Acute ischemic left MCA stroke ([place]) 07/21/2019  . Cerebral thrombosis with cerebral infarction 07/20/2019  . Aphasia 07/19/2019  . Lobar pneumonia ([place]) 06/14/2016  . Respiratory failure with hypoxia ([place]) 06/13/2016  . Multiple closed pelvic fractures without disruption of pelvic circle ([place]) 04/03/2016  . Multiple pelvic fractures ([place]) 04/01/2016  . Fall 04/01/2016  . Pneumonia 12/19/2015  . Advanced age 23/26/2017  . Protein-calorie malnutrition ([place]) 11/27/2015  . Frailty 11/27/2015  . Protein-calorie malnutrition, severe 11/15/2015  . COPD exacerbation ([place]) 11/12/2015  . CAD (coronary artery disease), native coronary artery 11/12/2015  . Statin intolerance 10/11/2014  . Headache disorder 09/18/2014  . Laceration of right lower leg with complication 54/65/6812  . Post-traumatic wound infection 08/27/2013  . Rash and nonspecific skin eruption 11/09/2012  . Pain of right breast 08/15/2012  . Hypokalemia 08/15/2012  . Urinary frequency 08/15/2012  . HX: breast cancer 08/15/2012  . TOBACCO USE 04/21/2012  . Arthritis 05/31/2011  . Pain of left heel 05/31/2011  . Left-sided weakness 05/31/2011  . Infected cyst of skin 12/29/2010  . Leg cramps  12/12/2010  . SOB (shortness of breath) 11/26/2010  . Glossitis 11/26/2010  . Thrombocytopenia ([place]) 07/21/2010  . Traumatic ecchymosis of lower leg 07/21/2010  . Atelectasis 07/16/2010  . Tinnitus 06/04/2010  . NEPHROLITHIASIS, HX OF 02/27/2010  . HIP PAIN 02/26/2010  . FEMORAL BRUIT, RIGHT 01/02/2010  . UNSTABLE ANGINA 12/12/2009  . Essential hypertension, benign 06/03/2009  . LIVER FUNCTION TESTS, ABNORMAL 01/22/2009  . NEOPLASM, SKIN, UNCERTAIN BEHAVIOR 75/17/0017  . OSTEOARTHRITIS, HAND 12/16/2008  . UNSPECIFIED DISORDER OF KIDNEY AND URETER 09/17/2008  . RENAL ARTERY STENOSIS 08/01/2008  . THROMBOCYTOPENIA 07/30/2008  . PERSONAL HISTORY OF MALIGNANT NEOPLASM OF BREAST 07/24/2008  . WEIGHT LOSS 06/11/2008  . GROIN PAIN 06/11/2008  . INSOMNIA 04/05/2008  . UNSPECIFIED ANEMIA 03/18/2008  . Hyperlipidemia 03/10/2008  . UNDERWEIGHT 01/17/2008  . DERMATITIS 09/25/2007  . UNSPECIFIED VITAMIN D DEFICIENCY 02/07/2007  . HYPOTHYROIDISM 01/30/2007  . CHRONIC RHINITIS 12/13/2006  . COPD (chronic obstructive pulmonary disease) ([place]) 11/21/2006  . SPINAL STENOSIS 11/01/2006  . Carotid arterial disease ([place]) 10/03/2006  . DEGENERATIVE JOINT DISEASE 09/27/2006  . LOSS, HEARING NOS 08/25/2006  . TMJ PAIN 08/25/2006  . CARCINOMA, BASAL CELL 02/13/2006  . Coronary atherosclerosis 02/13/2006  . Peripheral vascular disease ([place]) 02/13/2006  . HEMATURIA, MICROSCOPIC, HX OF 02/13/2006    Orientation RESPIRATION BLADDER Height & Weight     Self, Time, Situation, Place  Normal Incontinent Weight: 100 lb (45.4 kg) Height:  5\' 4"  (162.6 cm)  BEHAVIORAL SYMPTOMS/MOOD NEUROLOGICAL BOWEL NUTRITION STATUS      Incontinent Diet (see discharge summary)  AMBULATORY STATUS COMMUNICATION OF NEEDS Skin   Extensive Assist Verbally Normal  Personal Care Assistance Level of Assistance  Bathing, Feeding, Dressing Bathing Assistance: Maximum assistance Feeding assistance:  Limited assistance Dressing Assistance: Maximum assistance     Functional Limitations Info  Sight, Hearing, Speech Sight Info: Adequate Hearing Info: Adequate Speech Info: Adequate    SPECIAL CARE FACTORS FREQUENCY  PT (By licensed PT), OT (By licensed OT)     PT Frequency: 5x a week OT Frequency: 5x a week            Contractures Contractures Info: Not present    Additional Factors Info  Code Status, Allergies Code Status Info: Full Allergies Info: Medrol, Statins Oxycodone Albuterol Hydrocodone-acetaminophen Tequin           Current Medications (07/24/2019):  This is the current hospital active medication list Current Facility-Administered Medications  Medication Dose Route Frequency Provider Last Rate Last Admin  . acetaminophen (TYLENOL) tablet 650 mg  650 mg Oral Q4H PRN Lenore Cordia, MD   650 mg at 07/22/19 0108   Or  . acetaminophen (TYLENOL) 160 MG/5ML solution 650 mg  650 mg Per Tube Q4H PRN Lenore Cordia, MD       Or  . acetaminophen (TYLENOL) suppository 650 mg  650 mg Rectal Q4H PRN Zada Finders R, MD      . amLODipine (NORVASC) tablet 10 mg  10 mg Oral Daily Jennye Boroughs, MD   10 mg at 07/24/19 0913  . aspirin EC tablet 325 mg  325 mg Oral Daily Rosalin Hawking, MD   325 mg at 07/24/19 0913  . azithromycin (ZITHROMAX) tablet 500 mg  500 mg Oral Daily Shelly Coss, MD   500 mg at 07/24/19 0913  . budesonide (PULMICORT) nebulizer solution 1 mg  1 mg Inhalation BID Lenore Cordia, MD   1 mg at 07/24/19 0750  . clopidogrel (PLAVIX) tablet 75 mg  75 mg Oral Daily Kerney Elbe, MD   75 mg at 07/24/19 0913  . ezetimibe (ZETIA) tablet 10 mg  10 mg Oral Daily Rosalin Hawking, MD   10 mg at 07/24/19 0913  . glycopyrrolate (ROBINUL) tablet 1 mg  1 mg Oral BID Shelly Coss, MD   1 mg at 07/24/19 1043  . guaiFENesin (MUCINEX) 12 hr tablet 1,200 mg  1,200 mg Oral BID Shelly Coss, MD   1,200 mg at 07/24/19 0913  . heparin injection 5,000 Units  5,000 Units  Subcutaneous Q8H Lenore Cordia, MD   5,000 Units at 07/24/19 0533  . isosorbide mononitrate (IMDUR) 24 hr tablet 30 mg  30 mg Oral Daily Shelly Coss, MD   30 mg at 07/24/19 0913  . levalbuterol (XOPENEX) nebulizer solution 0.63 mg  0.63 mg Inhalation Q6H PRN Lenore Cordia, MD   0.63 mg at 07/22/19 1431  . LORazepam (ATIVAN) injection 1 mg  1 mg Intravenous Once PRN Zada Finders R, MD      . methylPREDNISolone sodium succinate (SOLU-MEDROL) 40 mg/mL injection 40 mg  40 mg Intravenous Q12H Shelly Coss, MD   40 mg at 07/24/19 9924  . metoprolol succinate (TOPROL-XL) 24 hr tablet 25 mg  25 mg Oral Daily Jennye Boroughs, MD   25 mg at 07/24/19 0913  . ondansetron (ZOFRAN) injection 4 mg  4 mg Intravenous Q6H PRN Quintella Baton, MD   4 mg at 07/20/19 0408  . polyvinyl alcohol (LIQUIFILM TEARS) 1.4 % ophthalmic solution 1 drop  1 drop Both Eyes QID Joselyn Glassman A, RPH   1 drop at 07/24/19 0920  .  senna-docusate (Senokot-S) tablet 1 tablet  1 tablet Oral QHS PRN Lenore Cordia, MD      . traMADol Veatrice Bourbon) tablet 50 mg  50 mg Oral Q6H PRN Jennye Boroughs, MD   50 mg at 07/22/19 0306  . umeclidinium bromide (INCRUSE ELLIPTA) 62.5 MCG/INH 1 puff  1 puff Inhalation Daily Lenore Cordia, MD   1 puff at 07/24/19 0755     Discharge Medications: Please see discharge summary for a list of discharge medications.  Relevant Imaging Results:  Relevant Lab Results:   Additional Information SSN [id number]  Emeterio Reeve, [place]

## 2019-07-24 NOTE — Evaluation (Signed)
Speech Language Pathology Evaluation Patient Details Name: [name] [name] MRN: [id number] DOB: Jul 13, 1927 Today's Date: 07/24/2019 Time: 7902-4097 SLP Time Calculation (min) (ACUTE ONLY): 17 min  Problem List:  Patient Active Problem List   Diagnosis Date Noted  . Acute ischemic left MCA stroke ([place]) 07/21/2019  . Cerebral thrombosis with cerebral infarction 07/20/2019  . Aphasia 07/19/2019  . Lobar pneumonia ([place]) 06/14/2016  . Respiratory failure with hypoxia ([place]) 06/13/2016  . Multiple closed pelvic fractures without disruption of pelvic circle ([place]) 04/03/2016  . Multiple pelvic fractures ([place]) 04/01/2016  . Fall 04/01/2016  . Pneumonia 12/19/2015  . Advanced age 55/26/2017  . Protein-calorie malnutrition ([place]) 11/27/2015  . Frailty 11/27/2015  . Protein-calorie malnutrition, severe 11/15/2015  . COPD exacerbation ([place]) 11/12/2015  . CAD (coronary artery disease), native coronary artery 11/12/2015  . Statin intolerance 10/11/2014  . Headache disorder 09/18/2014  . Laceration of right lower leg with complication 35/32/9924  . Post-traumatic wound infection 08/27/2013  . Rash and nonspecific skin eruption 11/09/2012  . Pain of right breast 08/15/2012  . Hypokalemia 08/15/2012  . Urinary frequency 08/15/2012  . HX: breast cancer 08/15/2012  . TOBACCO USE 04/21/2012  . Arthritis 05/31/2011  . Pain of left heel 05/31/2011  . Left-sided weakness 05/31/2011  . Infected cyst of skin 12/29/2010  . Leg cramps 12/12/2010  . SOB (shortness of breath) 11/26/2010  . Glossitis 11/26/2010  . Thrombocytopenia ([place]) 07/21/2010  . Traumatic ecchymosis of lower leg 07/21/2010  . Atelectasis 07/16/2010  . Tinnitus 06/04/2010  . NEPHROLITHIASIS, HX OF 02/27/2010  . HIP PAIN 02/26/2010  . FEMORAL BRUIT, RIGHT 01/02/2010  . UNSTABLE ANGINA 12/12/2009  . Essential hypertension, benign 06/03/2009  . LIVER FUNCTION TESTS, ABNORMAL 01/22/2009  . NEOPLASM, SKIN, UNCERTAIN BEHAVIOR  26/83/4196  . OSTEOARTHRITIS, HAND 12/16/2008  . UNSPECIFIED DISORDER OF KIDNEY AND URETER 09/17/2008  . RENAL ARTERY STENOSIS 08/01/2008  . THROMBOCYTOPENIA 07/30/2008  . PERSONAL HISTORY OF MALIGNANT NEOPLASM OF BREAST 07/24/2008  . WEIGHT LOSS 06/11/2008  . GROIN PAIN 06/11/2008  . INSOMNIA 04/05/2008  . UNSPECIFIED ANEMIA 03/18/2008  . Hyperlipidemia 03/10/2008  . UNDERWEIGHT 01/17/2008  . DERMATITIS 09/25/2007  . UNSPECIFIED VITAMIN D DEFICIENCY 02/07/2007  . HYPOTHYROIDISM 01/30/2007  . CHRONIC RHINITIS 12/13/2006  . COPD (chronic obstructive pulmonary disease) ([place]) 11/21/2006  . SPINAL STENOSIS 11/01/2006  . Carotid arterial disease ([place]) 10/03/2006  . DEGENERATIVE JOINT DISEASE 09/27/2006  . LOSS, HEARING NOS 08/25/2006  . TMJ PAIN 08/25/2006  . CARCINOMA, BASAL CELL 02/13/2006  . Coronary atherosclerosis 02/13/2006  . Peripheral vascular disease ([place]) 02/13/2006  . HEMATURIA, MICROSCOPIC, HX OF 02/13/2006   Past Medical History:  Past Medical History:  Diagnosis Date  . CARCINOMA, BASAL CELL 02/13/2006  . CAROTID ARTERY DISEASE 10/03/2006   Carotid US 11/17: Stable 40-59% bilateral ICA stenosis >> f/u 1 year  . CHRONIC OBSTRUCTIVE PULMONARY DISEASE, ACUTE EXACERBATION 11/17/2009  . Chronic rhinitis 12/13/2006  . CORONARY ARTERY DISEASE 02/13/2006  . DEGENERATIVE JOINT DISEASE 09/27/2006  . DERMATITIS 09/25/2007  . Head trauma    closed  . HYPERLIPIDEMIA 03/10/2008  . HYPERTENSION 02/13/2006  . HYPOTHYROIDISM 01/30/2007  . LIVER FUNCTION TESTS, ABNORMAL 01/22/2009  . LOSS, HEARING NOS 08/25/2006  . Near syncope 09/18/2014  . NEOPLASM, SKIN, UNCERTAIN BEHAVIOR 22/29/7989  . NEPHROLITHIASIS, HX OF 02/27/2010  . OSTEOARTHRITIS, HAND 12/16/2008  . PERIPHERAL VASCULAR DISEASE 02/13/2006  . Personal history of malignant neoplasm of breast 07/24/2008  . Personal history of urinary calculi   . Post-traumatic wound  infection 12/01/2010   Mild streaking superiorly and on one  day of septra  Will give rocephin and add keflex  adn follow up   . POSTHERPETIC NEURALGIA 08/25/2006  . RENAL ARTERY STENOSIS 08/01/2008  . Shingles    recurrent  . SPINAL STENOSIS 11/01/2006  . THROMBOCYTOPENIA 07/30/2008  . UNS ADVRS EFF UNS RX MEDICINAL&BIOLOGICAL SBSTNC 01/30/2007  . UNSPECIFIED ANEMIA 03/18/2008  . Unspecified vitamin D deficiency 02/07/2007  . UNSTABLE ANGINA 12/12/2009  . Vitreous detachment    Past Surgical History:  Past Surgical History:  Procedure Laterality Date  . ABDOMINAL HYSTERECTOMY    . BREAST LUMPECTOMY    . BREAST LUMPECTOMY  1998  . CATARACT EXTRACTION    . CORONARY ANGIOPLASTY WITH STENT PLACEMENT     bilat iliac stents, left renal artery and rt coronary artery last myoview 8/09 with EF 70%  . LUMBAR LAMINECTOMY    . SKIN BIOPSY  08/24/2017   Invasive squamous cell carcinoma  . SKIN CANCER EXCISION    . TONSILLECTOMY     HPI:  [name] [name] is a 86 y.o. female who presents to the ED for evaluation of left-sided weakness and intermittent expressive aphasia.  MRI showed multiple punctate strokes at left BG and corona radiata. Initial bedside swallow eval was completed 6/18. Patient reported to have worsening of right sided weakness-->now flaccid with significant deficits.   Assessment / Plan / Recommendation Clinical Impression  Based on chart review, pt presents with decline in communicative/swallowing function.  She has a moderate dysarthria of speech, significant right lower facial weakness (CN VII), paucity of verbal output.  Spontaneous speech was minimal.  Naming to confrontation and responsive naming were both impaired, with pt showing some recognition/frustration at her inability to retrieve the target words.  She followed one-step commands, demonstrated some perseveration when shifting targets, and had difficulty with two-step commands.  Yes/no reliability was ~ 75% accurate for biographical information.  Effort appeared limited today.   During session, pt was offered several sips of water, after which she coughed.  There is audible congestion and wheezing.  Overall, there are concerns for decline in swallowing function since her initial evaluation on June 18.   Recommend: proceeding with MBS today to determine presence of pharyngeal dysphagia; SLP f/u to address dysarthria and aphasia.  Pt will required SNF f/u for the aforementioned deficits. D/W RN.     SLP Assessment  SLP Recommendation/Assessment: Patient needs continued Speech Lanaguage Pathology Services SLP Visit Diagnosis: Aphasia (R47.01);Dysarthria and anarthria (R47.1)    Follow Up Recommendations  Skilled Nursing facility    Frequency and Duration min 2x/week  2 weeks      SLP Evaluation Cognition  Overall Cognitive Status: Impaired/Different from baseline Arousal/Alertness: Awake/alert Orientation Level: Oriented X4 Attention: Sustained Sustained Attention: Appears intact       Comprehension  Auditory Comprehension Overall Auditory Comprehension: Impaired Yes/No Questions: Impaired Basic Immediate Environment Questions: 75-100% accurate Commands: Impaired One Step Basic Commands: 75-100% accurate Two Step Basic Commands: 50-74% accurate Conversation: Simple Visual Recognition/Discrimination Discrimination: Not tested    Expression Expression Primary Mode of Expression: Verbal Verbal Expression Overall Verbal Expression: Impaired Initiation: Impaired Level of Generative/Spontaneous Verbalization: Phrase Naming: Impairment Responsive: 51-75% accurate Confrontation: Impaired Convergent: 75-100% accurate Verbal Errors: Perseveration Written Expression Dominant Hand: Right   Oral / Motor  Oral Motor/Sensory Function Overall Oral Motor/Sensory Function: Moderate impairment Facial ROM: Reduced right Facial Symmetry: Abnormal symmetry right;Suspected CN VII (facial) dysfunction Facial Strength: Reduced right;Suspected CN VII (facial)  dysfunction Motor Speech Overall Motor Speech: Impaired Respiration: Impaired Phonation: Hoarse Articulation: Impaired Level of Impairment: Word Intelligibility: Intelligibility reduced Word: 50-74% accurate Phrase: 50-74% accurate Motor Speech Errors: Not applicable   GO                    Juan Quam Laurice 07/24/2019, 10:27 AM  Estill Bamberg L. Tivis Ringer, [place] Office number [phone] Pager [phone]

## 2019-07-24 NOTE — Progress Notes (Addendum)
PROGRESS NOTE    [name] [name]  XVQ:[id number] DOB: 1927-04-09 DOA: 07/19/2019 PCP: Burnis Medin, MD   Brief Narrative: Patient is a 86 year old female with history of coronary disease status post stenting to RCA in 2005, peripheral vascular disease, COPD, CKD stage IIIa, hypertension, hyperlipidemia, carotid stenosis who presented to the emergency department for the evaluation of left-sided weakness and intermittent expressive aphasia.  Initial CT head/MRI of the brain did not show any stroke.  Later in the hospital, she developed right-sided weakness.  Repeat MRI showed acute infarct in the left coronary rate and basal ganglia.  Neurology was consulted and following.  Stroke workup completed.  Neurology recommended dual antiplatelet therapy with aspirin Plavix for 3 months followed by Plavix monotherapy because of severe left MCA stenosis.  PT/OT recommending  skilled nursing facility.  Patient is medically stable for discharge as soon as bed is available.  Assessment & Plan:   Principal Problem:   Acute ischemic left MCA stroke (HCC) Active Problems:   Hyperlipidemia   Essential hypertension, benign   Peripheral vascular disease (HCC)   COPD (chronic obstructive pulmonary disease) (HCC)   Left-sided weakness   Hypokalemia   CAD (coronary artery disease), native coronary artery   Aphasia   Cerebral thrombosis with cerebral infarction   Acute ischemic stroke: Has left MCA subcortical infarct likely due to high-grade stenosis of left M1.  MRI head initially did not show any acute brain findings.  MRA head showed 6 mm short segment severe stenosis involving the mid distal left M1 segment.  CT head and neck showed compensated severe left MCA M1 stenosis, severe left vertebral V4 segment stenosis, moderate to severe bilateral PCA stenosis.  Also showed bilateral carotid bifurcation stenosis affecting ICA: 60% on the right and 55 to 60% on the left.  Last MRI showed multiple puncture  the stroke at left basal ganglia and corona radiata.  Because of his stroke is most likely due to large vessel disease of high-grade left MCA stenosis. Neurology was consulted and was following.  Recommended aspirin 325 mg and Plavix for 3 months followed by Plavix alone.  She needs to follow[facility] with [facility] neurology as an outpatient. Stroke work-up completed.  Hemoglobin A1c of 5.6.  LDL of 120. Echocardiogram showed ejection fraction of 65%, no cardiac source of emboli. PT/OT recommending CIR versus skilled nursing facility. Speech therapy suspecting dysphagia.  Going for MBS today.  COPD exacerbation:   Started on Solu-Medrol and azithromycin.  Chest x-ray did not show pneumonia or any other acute findings.  Continue mucolytics.  She is not on oxygen at home.  Continue bronchodilators.She continues to have congestion.  Will check chest x-ray again today.She might need oxygen on discharge. Discussed with the granddaughter, as per her ,patient has chronic congestion and smoker's cough  Hypertension: Currently on Norvasc, Toprol, Imdur.  She is still mildly hypertensive.  Continue current medications and continue to monitor blood pressure.  Hyperlipidemia: LDL of 120.  She has a statin intolerance.  Continue Zetia.  Coronary artery disease: Status post stenting to the RCA.  Denies any chest pain.  Continue current medications  CKD stage IIIa: Chronic and appears stable.  Tobacco abuse: Currently smokes.  Smoking cessation counseled.  Patient is willing to quit.  Severe malnutrition: BMI of 17.16            DVT prophylaxis:Heparin [place] Code Status: Full Family Communication: Discussed with granddaughter on phone on 07/24/19 Status is: Inpatient  Remains inpatient appropriate because:Unsafe d/c  plan   Dispo: The patient is from: Home              Anticipated d/c is to:SNF              Anticipated d/c date is: 2 days  Medically stable for DC as soon as bed is available         Consultants: Neurology  Procedures: None  Antimicrobials:  Anti-infectives (From admission, onward)   Start     Dose/Rate Route Frequency Ordered Stop   07/23/19 1000  azithromycin (ZITHROMAX) tablet 500 mg     Discontinue     500 mg Oral Daily 07/23/19 0858 07/26/19 0959      Subjective: Patient seen and examined at the bedside this morning.  Hemodynamically stable.  Comfortable, not in distress.  She was on 2 L of oxygen this morning.  She still looks congested and was coughing.Repeating cxr  Objective: Vitals:   07/23/19 2001 07/23/19 2003 07/23/19 2353 07/24/19 0422  BP: (!) 138/55  (!) 155/71 (!) 153/55  Pulse: (!) 56  (!) 51 (!) 52  Resp: 18  16 18   Temp: (!) 97.3 F (36.3 C)  (!) 97.5 F (36.4 C) 97.7 F (36.5 C)  TempSrc: Oral  Oral Oral  SpO2: 97% 93% 96% 98%  Weight:      Height:        Intake/Output Summary (Last 24 hours) at 07/24/2019 6578 Last data filed at 07/23/2019 1300 Gross per 24 hour  Intake 240 ml  Output --  Net 240 ml   Filed Weights   07/19/19 1743  Weight: 45.4 kg    Examination:  General exam: Extremely deconditioned, debilitated elderly female  Respiratory system: Bilateral decreased air entry, rhonchi  cardiovascular system: S1 & S2 heard, RRR. No JVD, murmurs, rubs, gallops or clicks. Gastrointestinal system: Abdomen is nondistended, soft and nontender. No organomegaly or masses felt. Normal bowel sounds heard. Central nervous system: Alert and oriented. Right facial droop, weakness on the right side with motor of 4/5 on right upper and lower extremities Extremities: No edema, no clubbing ,no cyanosis, distal peripheral pulses palpable. Skin: No rashes, lesions or ulcers,no icterus ,no pallor    Data Reviewed: I have personally reviewed following labs and imaging studies  CBC: Recent Labs  Lab 07/17/19 1657 07/19/19 1825 07/19/19 1918 07/20/19 0407  WBC 7.3  --  7.5 8.3  NEUTROABS  --   --  5.2  --   HGB 14.4 13.6 13.4  14.6  HCT 45.7 40.0 42.8 45.9  MCV 94.0  --  94.7 93.7  PLT 206  --  178 469   Basic Metabolic Panel: Recent Labs  Lab 07/17/19 1657 07/19/19 1755 07/19/19 1825 07/20/19 0407 07/22/19 0841  NA 143 144 145 139 143  K 3.5 3.4* 3.4* 3.6 3.5  CL 105 108 105 104 106  CO2 30 26  --  26 28  GLUCOSE 87 90 88 114* 107*  BUN 20 25* 30* 17 19  CREATININE 1.20* 1.23* 1.30* 0.90 0.97  CALCIUM 9.6 9.4  --  8.9 9.1  MG  --   --   --  1.7  --   PHOS  --   --   --  2.2* 3.7   GFR: Estimated Creatinine Clearance: 28.2 mL/min (by C-G formula based on SCr of 0.97 mg/dL). Liver Function Tests: Recent Labs  Lab 07/19/19 1755  AST 20  ALT 12  ALKPHOS 77  BILITOT 0.6  PROT 6.0*  ALBUMIN 3.3*   No results for input(s): LIPASE, AMYLASE in the last 168 hours. No results for input(s): AMMONIA in the last 168 hours. Coagulation Profile: Recent Labs  Lab 07/19/19 1755  INR 1.0   Cardiac Enzymes: No results for input(s): CKTOTAL, CKMB, CKMBINDEX, TROPONINI in the last 168 hours. BNP (last 3 results) No results for input(s): PROBNP in the last 8760 hours. HbA1C: No results for input(s): HGBA1C in the last 72 hours. CBG: No results for input(s): GLUCAP in the last 168 hours. Lipid Profile: No results for input(s): CHOL, HDL, LDLCALC, TRIG, CHOLHDL, LDLDIRECT in the last 72 hours. Thyroid Function Tests: No results for input(s): TSH, T4TOTAL, FREET4, T3FREE, THYROIDAB in the last 72 hours. Anemia Panel: No results for input(s): VITAMINB12, FOLATE, FERRITIN, TIBC, IRON, RETICCTPCT in the last 72 hours. Sepsis Labs: No results for input(s): PROCALCITON, LATICACIDVEN in the last 168 hours.  Recent Results (from the past 240 hour(s))  SARS Coronavirus 2 by RT PCR (hospital order, performed in [facility] hospital lab) Nasopharyngeal Nasopharyngeal Swab     Status: None   Collection Time: 07/19/19 10:53 PM   Specimen: Nasopharyngeal Swab  Result Value Ref Range Status   SARS Coronavirus 2  NEGATIVE NEGATIVE Final    Comment: (NOTE) SARS-CoV-2 target nucleic acids are NOT DETECTED.  The SARS-CoV-2 RNA is generally detectable in upper and lower respiratory specimens during the acute phase of infection. The lowest concentration of SARS-CoV-2 viral copies this assay can detect is 250 copies / mL. A negative result does not preclude SARS-CoV-2 infection and should not be used as the sole basis for treatment or other patient management decisions.  A negative result may occur with improper specimen collection / handling, submission of specimen other than nasopharyngeal swab, presence of viral mutation(s) within the areas targeted by this assay, and inadequate number of viral copies (<250 copies / mL). A negative result must be combined with clinical observations, patient history, and epidemiological information.  Fact Sheet for Patients:   StrictlyIdeas.no  Fact Sheet for Healthcare Providers: BankingDealers.co.za  This test is not yet approved or  cleared by the [place] FDA and has been authorized for detection and/or diagnosis of SARS-CoV-2 by FDA under an Emergency Use Authorization (EUA).  This EUA will remain in effect (meaning this test can be used) for the duration of the COVID-19 declaration under Section 564(b)(1) of the Act, 21 U.S.C. section 360bbb-3(b)(1), unless the authorization is terminated or revoked sooner.  Performed at [facility], [place] [address], [place], [place] [address]          Radiology Studies: No results found.      Scheduled Meds: . amLODipine  10 mg Oral Daily  . aspirin EC  325 mg Oral Daily  . azithromycin  500 mg Oral Daily  . budesonide  1 mg Inhalation BID  . clopidogrel  75 mg Oral Daily  . ezetimibe  10 mg Oral Daily  . guaiFENesin  1,200 mg Oral BID  . heparin  5,000 Units Subcutaneous Q8H  . isosorbide mononitrate  30 mg Oral Daily  . methylPREDNISolone  (SOLU-MEDROL) injection  40 mg Intravenous Q12H  . metoprolol succinate  25 mg Oral Daily  . polyvinyl alcohol  1 drop Both Eyes QID  . umeclidinium bromide  1 puff Inhalation Daily   Continuous Infusions:   LOS: 4 days    Time spent: 25 mins,More than 50% of that time was spent in counseling and/or coordination of care.  Shelly Coss, MD [facility] P6/22/2021, 7:33 AM

## 2019-07-24 NOTE — Progress Notes (Signed)
Modified Barium Swallow Progress Note  Patient Details  Name: [name] [name] MRN: [id number] Date of Birth: 1927/09/11  Today's Date: 07/24/2019  Modified Barium Swallow completed.  Full report located under Chart Review in the Imaging Section.  Brief recommendations include the following:  Clinical Impression  Pt presents with moderate oropharyngeal dysphagia with intermittent aspiration of thin liquids before/during the swallow response - aspiration was not consistently accompanied by a cough response - it was occasionally silent in nature.  There was prolonged and decreased coordination of the oral phase of the swallow, with spillage from right side of oral cavity and premature spillage over base of tongue.  Aspiration was generally due to a problem with timing of laryngeal vestibule closure.  Nectar-thick liquids allowed for improved timing and no observed penetration/aspiration.  Pt had difficulty following commands for postural/strategic adjustments.  Anatomy was remarkable for degenerative changes in the cervical spine - this did not impact function. Calcified plaque in the arteries was also notable.  Recommend changing diet to dysphagia 3, nectar-thick liquids. Treatment should focus on improving coordination/sensory reception within oral cavity as well as timing of pharyngeal response.     Swallow Evaluation Recommendations       SLP Diet Recommendations: Dysphagia 3 (Mech soft) solids;Nectar thick liquid   Liquid Administration via: Cup   Medication Administration: Crushed with puree   Supervision: Patient able to self feed;Staff to assist with self feeding   Compensations: Slow rate;Small sips/bites   Postural Changes: Remain semi-upright after after feeds/meals (Comment)   Oral Care Recommendations: Oral care BID   Other Recommendations: Order thickener from [facility]. [name] [name], [name] [name] Office number [phone] Pager  [phone]  [name] [name] 07/24/2019,1:27 PM

## 2019-07-24 NOTE — TOC Initial Note (Addendum)
Transition of Care [facility]) - Initial/Assessment Note    Patient Details  Name: [name] [name] MRN: [id number] Date of Birth: [birth year]  Transition of Care [facility]) CM/SW Contact:    Emeterio Reeve, [place] Phone Number: 07/24/2019, 10:57 AM  Clinical Narrative:                  CSW met with pt at bedside. CSW introduced self and explained her role at the hospital. Pt stated that PTA she was living at home alone. Pt states has a walker at home but doesn't use it often.   Pt reveiwed PT/OT reccs with Pt for SNF. Pt stated no, she does not want to go to a snf and will return home. Pt stated that she will find help. CSW inquired if pt has family or someone to help with care. Pt stated she has a grand daughter named Thea Alken and gave permission to call.   CSW called Kirke Shaggy states that the last couple of days before going to the hospital she noticed that the pt was becoming weaker and that she was becoming more confused.   CSW reviewed pt/ot reccs with [facility], she states she feels like her grandmother can benefit from SNF for short term and she will talk to her about it. Malissa gave CSW permission to fax information to SNF's in the area and she will review bed offers and decide if they want to take her home or to a SNF. Malissa states she wants whats best for her grandmother and she is able to move in with her for a while to provide 24 hr supervision if she continues to refuse snf. Malissa states they have the means to hire an aid if needed.    Expected Discharge Plan: Skilled Nursing Facility Barriers to Discharge: Continued Medical Work up   Patient Goals and CMS Choice Patient states their goals for this hospitalization and ongoing recovery are:: To go back home CMS Medicare.gov Compare Post Acute Care list provided to:: Patient Choice offered to / list presented to : Patient, Adult Children  Expected Discharge Plan and Services Expected Discharge Plan: [place] arrangements for the past 2 months: Single Family Home                                      Prior Living Arrangements/Services Living arrangements for the past 2 months: Single Family Home Lives with:: Self Patient language and need for interpreter reviewed:: Yes Do you feel safe going back to the place where you live?: Yes      Need for Family Participation in Patient Care: Yes (Comment) Care giver support system in place?: Yes (comment) Current home services: DME Criminal Activity/Legal Involvement Pertinent to Current Situation/Hospitalization: No - Comment as needed  Activities of Daily Living Home Assistive Devices/Equipment: Gilford Rile (specify type) ADL Screening (condition at time of admission) Patient's cognitive ability adequate to safely complete daily activities?: Yes Is the patient deaf or have difficulty hearing?: No Does the patient have difficulty seeing, even when wearing glasses/contacts?: No Does the patient have difficulty concentrating, remembering, or making decisions?: No Patient able to express need for assistance with ADLs?: Yes Does the patient have difficulty dressing or bathing?: Yes Independently performs ADLs?: No Communication: Independent Dressing (OT): Needs assistance Is this a change from baseline?: Change from baseline, expected to last <3days Grooming: Independent  Feeding: Needs assistance Is this a change from baseline?: Change from baseline, expected to last <3 days Bathing: Needs assistance Is this a change from baseline?: Change from baseline, expected to last <3 days Toileting: Needs assistance Is this a change from baseline?: Change from baseline, expected to last <3 days In/Out Bed: Independent Walks in Home: Independent with device (comment) Does the patient have difficulty walking or climbing stairs?: Yes Weakness of Legs: Both Weakness of Arms/Hands: Right  Permission Sought/Granted Permission  sought to share information with : Family Supports Permission granted to share information with : Yes, Verbal Permission Granted  Share Information with NAME: [place] granted to share info w AGENCY: SNF  Permission granted to share info w Relationship: [place] daughter  Permission granted to share info w Contact Information: [phone]  Emotional Assessment Appearance:: Appears stated age Attitude/Demeanor/Rapport: Engaged Affect (typically observed): Appropriate Orientation: : Oriented to Self, Oriented to Place, Oriented to  Time, Oriented to Situation Alcohol / Substance Use: Not Applicable Psych Involvement: No (comment)  Admission diagnosis:  Aphasia [R47.01] Weakness [R53.1] Left-sided weakness [R53.1] Patient Active Problem List   Diagnosis Date Noted  . Acute ischemic left MCA stroke ([place]) 07/21/2019  . Cerebral thrombosis with cerebral infarction 07/20/2019  . Aphasia 07/19/2019  . Lobar pneumonia ([place]) 06/14/2016  . Respiratory failure with hypoxia ([place]) 06/13/2016  . Multiple closed pelvic fractures without disruption of pelvic circle ([place]) 04/03/2016  . Multiple pelvic fractures ([place]) 04/01/2016  . Fall 04/01/2016  . Pneumonia 12/19/2015  . Advanced age 07/28/2015  . Protein-calorie malnutrition ([place]) 11/27/2015  . Frailty 11/27/2015  . Protein-calorie malnutrition, severe 11/15/2015  . COPD exacerbation ([place]) 11/12/2015  . CAD (coronary artery disease), native coronary artery 11/12/2015  . Statin intolerance 10/11/2014  . Headache disorder 09/18/2014  . Laceration of right lower leg with complication 60/73/7106  . Post-traumatic wound infection 08/27/2013  . Rash and nonspecific skin eruption 11/09/2012  . Pain of right breast 08/15/2012  . Hypokalemia 08/15/2012  . Urinary frequency 08/15/2012  . HX: breast cancer 08/15/2012  . TOBACCO USE 04/21/2012  . Arthritis 05/31/2011  . Pain of left heel 05/31/2011  . Left-sided weakness  05/31/2011  . Infected cyst of skin 12/29/2010  . Leg cramps 12/12/2010  . SOB (shortness of breath) 11/26/2010  . Glossitis 11/26/2010  . Thrombocytopenia ([place]) 07/21/2010  . Traumatic ecchymosis of lower leg 07/21/2010  . Atelectasis 07/16/2010  . Tinnitus 06/04/2010  . NEPHROLITHIASIS, HX OF 02/27/2010  . HIP PAIN 02/26/2010  . FEMORAL BRUIT, RIGHT 01/02/2010  . UNSTABLE ANGINA 12/12/2009  . Essential hypertension, benign 06/03/2009  . LIVER FUNCTION TESTS, ABNORMAL 01/22/2009  . NEOPLASM, SKIN, UNCERTAIN BEHAVIOR 26/94/8546  . OSTEOARTHRITIS, HAND 12/16/2008  . UNSPECIFIED DISORDER OF KIDNEY AND URETER 09/17/2008  . RENAL ARTERY STENOSIS 08/01/2008  . THROMBOCYTOPENIA 07/30/2008  . PERSONAL HISTORY OF MALIGNANT NEOPLASM OF BREAST 07/24/2008  . WEIGHT LOSS 06/11/2008  . GROIN PAIN 06/11/2008  . INSOMNIA 04/05/2008  . UNSPECIFIED ANEMIA 03/18/2008  . Hyperlipidemia 03/10/2008  . UNDERWEIGHT 01/17/2008  . DERMATITIS 09/25/2007  . UNSPECIFIED VITAMIN D DEFICIENCY 02/07/2007  . HYPOTHYROIDISM 01/30/2007  . CHRONIC RHINITIS 12/13/2006  . COPD (chronic obstructive pulmonary disease) ([place]) 11/21/2006  . SPINAL STENOSIS 11/01/2006  . Carotid arterial disease ([place]) 10/03/2006  . DEGENERATIVE JOINT DISEASE 09/27/2006  . LOSS, HEARING NOS 08/25/2006  . TMJ PAIN 08/25/2006  . CARCINOMA, BASAL CELL 02/13/2006  . Coronary atherosclerosis 02/13/2006  . Peripheral vascular disease ([place]) 02/13/2006  .  HEMATURIA, MICROSCOPIC, HX OF 02/13/2006   PCP:  Burnis Medin, MD Pharmacy:   [facility] [place], [place] [address] Phone: [phone] Fax: [phone]     Social Determinants of Health (SDOH) Interventions    Readmission Risk Interventions No flowsheet data found.  Emeterio Reeve, Latanya Presser, [place] Social Worker [phone]

## 2019-07-24 NOTE — Progress Notes (Signed)
Inpatient Rehab Admissions Coordinator:   Met with pt and her granddaughter, Lenna Sciara, at the bedside.  Discussed Dr. Parke Poisson recommendation for slower paced, SNF level rehab and [name] in agreement.  Will sign off for CIR at this time.  Note CSW already following for SNF placement.    Shann Medal, PT, DPT Admissions Coordinator [phone] 07/24/19  2:05 PM

## 2019-07-25 ENCOUNTER — Inpatient Hospital Stay (HOSPITAL_COMMUNITY): Payer: Medicare HMO

## 2019-07-25 LAB — RENAL FUNCTION PANEL
Albumin: 2.8 g/dL — ABNORMAL LOW (ref 3.5–5.0)
Anion gap: 13 (ref 5–15)
BUN: 66 mg/dL — ABNORMAL HIGH (ref 8–23)
CO2: 21 mmol/L — ABNORMAL LOW (ref 22–32)
Calcium: 9.5 mg/dL (ref 8.9–10.3)
Chloride: 111 mmol/L (ref 98–111)
Creatinine, Ser: 1.44 mg/dL — ABNORMAL HIGH (ref 0.44–1.00)
GFR calc Af Amer: 37 mL/min — ABNORMAL LOW (ref >60)
GFR calc non Af Amer: 32 mL/min — ABNORMAL LOW (ref >60)
Glucose, Bld: 180 mg/dL — ABNORMAL HIGH (ref 70–99)
Phosphorus: 3.8 mg/dL (ref 2.5–4.6)
Potassium: 3.4 mmol/L — ABNORMAL LOW (ref 3.5–5.1)
Sodium: 145 mmol/L (ref 135–145)

## 2019-07-25 LAB — MAGNESIUM: Magnesium: 2.5 mg/dL — ABNORMAL HIGH (ref 1.7–2.4)

## 2019-07-25 LAB — TROPONIN I (HIGH SENSITIVITY): Troponin I (High Sensitivity): 11 ng/L (ref <18)

## 2019-07-25 MED ORDER — PREDNISONE 20 MG PO TABS
40.0000 mg | ORAL_TABLET | Freq: Every day | ORAL | Status: AC
  Administered 2019-07-26 – 2019-07-30 (×5): 40 mg via ORAL
  Filled 2019-07-25 (×5): qty 2

## 2019-07-25 MED ORDER — LEVALBUTEROL HCL 0.63 MG/3ML IN NEBU
0.6300 mg | INHALATION_SOLUTION | RESPIRATORY_TRACT | Status: DC | PRN
  Administered 2019-07-26: 0.63 mg via RESPIRATORY_TRACT
  Filled 2019-07-25: qty 3

## 2019-07-25 MED ORDER — POLYETHYLENE GLYCOL 3350 17 G PO PACK
17.0000 g | PACK | Freq: Every day | ORAL | Status: DC
  Administered 2019-07-25 – 2019-07-30 (×6): 17 g via ORAL
  Filled 2019-07-25 (×6): qty 1

## 2019-07-25 MED ORDER — LEVALBUTEROL HCL 0.63 MG/3ML IN NEBU
0.6300 mg | INHALATION_SOLUTION | Freq: Once | RESPIRATORY_TRACT | Status: AC
  Administered 2019-07-25: 0.63 mg via RESPIRATORY_TRACT
  Filled 2019-07-25: qty 3

## 2019-07-25 NOTE — Progress Notes (Signed)
Occupational Therapy Treatment Patient Details Name: [name] [name] MRN: [id number] DOB: Jun 16, 1927 Today's Date: 07/25/2019    History of present illness Pt is an 86 y/o female who presents with weakness and aphasia. 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   OT comments  Pt seen in conjunction with PT to maximize activity tolerance and participation. Overall, pt requires MAX - total A +2 for bed mobility with pt able to sit EOB ~ 15 mins with MAX A d/t R and posterior lean. Pt able to complete various sitting balance therapeutic activities such as leaning on to LUE and returning to midline with MOD A. Pt appeared to respond well to visual feedback to maintain upright posture. Pt completed anterior<>posterior leaning to activate trunk stabilization as precursor to higher level dynamic tasks. Pt completed UB ADLs EOB with MOD A for cleanliness. Pt continues to present with R hemianopsia as pt unable to respond to threat on R side or track past midline. Pt left chair position in bed. Agree with DC plan below, will follow.   Follow Up Recommendations  SNF;Supervision/Assistance - 24 hour;Other (comment) (CIR if patient becomes inpatient)    Equipment Recommendations  Other (comment) (defer to next venue of care)    Recommendations for Other Services      Precautions / Restrictions Precautions Precautions: Fall Precaution Comments: Questionable R shoulder subluxation Restrictions Weight Bearing Restrictions: No       Mobility Bed Mobility Overal bed mobility: Needs Assistance Bed Mobility: Rolling;Sidelying to Sit;Sit to Sidelying Rolling: Mod assist;Max assist Sidelying to sit: Total assist;+2 for physical assistance     Sit to sidelying: Max assist;+2 for physical assistance General bed mobility comments: Multimodal cues to attend to the R side and look past midline at the  railing. Hand-over-hand assist to reach for R railing with L hand. Mod assist to roll R, max assist to roll left, and +2 assist for transition into sidelying and elevate trunk to full sitting position.   Transfers                 General transfer comment: Deferred transfer OOB to chair. From a safety perspective, opted for bed in chair position due to R lateral lean.    Balance Overall balance assessment: Needs assistance Sitting-balance support: Feet supported;Single extremity supported Sitting balance-Leahy Scale: Zero Sitting balance - Comments: Max assist required to maintain upright posture and to prevent R lateral lean       Standing balance comment: Not tested                           ADL either performed or assessed with clinical judgement   ADL Overall ADL's : Needs assistance/impaired     Grooming: Wash/dry face;Sitting;Moderate assistance Grooming Details (indicate cue type and reason): MOD A for cleanliness with LUE, MAX cues to initiate task                   Toilet Transfer Details (indicate cue type and reason): defer         Functional mobility during ADLs: Maximal assistance;+2 for physical assistance (bed mobility only) General ADL Comments: pt continues to present with decreased activity tolerance, generalized weakness, and flaccid LUE impacting pts ability to engage in BADLs     Vision Baseline Vision/History: Wears glasses Wears Glasses: At all times Patient Visual Report: No change from baseline Vision  Assessment?: Vision impaired- to be further tested in functional context Additional Comments: pt appears to have R hemianopsia, unable to respond to threat on R side and unable to tract past midline   Perception     Praxis      Cognition Arousal/Alertness: Awake/alert Behavior During Therapy: Flat affect Overall Cognitive Status: Impaired/Different from baseline Area of Impairment: Orientation;Attention;Following  commands;Awareness;Problem solving                 Orientation Level:  (did not respond to orientation questions) Current Attention Level: Focused Memory: Decreased short-term memory Following Commands: Follows one step commands inconsistently;Follows one step commands with increased time Safety/Judgement: Decreased awareness of safety Awareness: Intellectual Problem Solving: Slow processing;Decreased initiation;Difficulty sequencing;Requires verbal cues;Requires tactile cues          Exercises Exercises: Other exercises Other Exercises Other Exercises: Trunk stabilization activity - lean onto L elbow and push back to sitting position x2, anterior trunk lean x2, PROM to RUE elbow flexion/ extension, wrist flexion/ extension   Shoulder Instructions       General Comments      Pertinent Vitals/ Pain       Pain Assessment: Faces Faces Pain Scale: Hurts little more Pain Location: General movement with transitions to/from EOB Pain Descriptors / Indicators: Grimacing;Moaning Pain Intervention(s): Monitored during session;Repositioned  Home Living                                          Prior Functioning/Environment              Frequency  Min 2X/week        Progress Toward Goals  OT Goals(current goals can now be found in the care plan section)  Progress towards OT goals: Progressing toward goals  Acute Rehab OT Goals Patient Stated Goal: None specifically stated OT Goal Formulation: With patient Time For Goal Achievement: 08/03/19 Potential to Achieve Goals: Good  Plan Discharge plan remains appropriate;Frequency remains appropriate    Co-evaluation      Reason for Co-Treatment: For patient/therapist safety;To address functional/ADL transfers;Complexity of the patient's impairments (multi-system involvement)   OT goals addressed during session: ADL's and self-care      AM-PAC OT "6 Clicks" Daily Activity     Outcome Measure    Help from another person eating meals?: A Lot Help from another person taking care of personal grooming?: A Lot Help from another person toileting, which includes using toliet, bedpan, or urinal?: A Lot Help from another person bathing (including washing, rinsing, drying)?: A Lot Help from another person to put on and taking off regular upper body clothing?: A Lot Help from another person to put on and taking off regular lower body clothing?: A Lot 6 Click Score: 12    End of Session    OT Visit Diagnosis: Unsteadiness on feet (R26.81);Other abnormalities of gait and mobility (R26.89);Muscle weakness (generalized) (M62.81);Other symptoms and signs involving the nervous system (R29.898);Other symptoms and signs involving cognitive function   Activity Tolerance Patient tolerated treatment well;Patient limited by lethargy   Patient Left in bed;with call bell/phone within reach;with bed alarm set;Other (comment) (up in chair position)   Nurse Communication          Time: 8115-7262 OT Time Calculation (min): 28 min  Charges: OT General Charges $OT Visit: 1 Visit OT Treatments $Therapeutic Activity: 8-22 mins  Corinne Ports C., COTA/L Acute Rehabilitation  Services [phone] [place] 07/25/2019, 1:12 PM

## 2019-07-25 NOTE — Progress Notes (Signed)
RN called to pt room by granddaughter stating pt having chest pain. EKG has been completed. Pt still having chest pain. No PRNs to give. MD [place] notified.

## 2019-07-25 NOTE — Progress Notes (Signed)
PROGRESS NOTE    [name] [name]  ALP:[id number] DOB: September 29, 1927 DOA: 07/19/2019 PCP: Burnis Medin, MD   Brief Narrative: Patient is a 86 year old female with history of coronary disease status post stenting to RCA in 2005, peripheral vascular disease, COPD, CKD stage IIIa, hypertension, hyperlipidemia, carotid stenosis who presented to the emergency department for the evaluation of left-sided weakness and intermittent expressive aphasia.  Initial CT head/MRI of the brain did not show any stroke.  Later in the hospital, she developed right-sided weakness.  Repeat MRI showed acute infarct in the left coronary rate and basal ganglia.  Neurology was consulted and following.  Stroke workup completed.  Neurology recommended dual antiplatelet therapy with aspirin Plavix for 3 months followed by Plavix monotherapy because of severe left MCA stenosis.  PT/OT recommending  skilled nursing facility.  Patient is medically stable for discharge as soon as bed is available.  Assessment & Plan:   Principal Problem:   Acute ischemic left MCA stroke (HCC) Active Problems:   Hyperlipidemia   Essential hypertension, benign   Peripheral vascular disease (HCC)   COPD (chronic obstructive pulmonary disease) (HCC)   Left-sided weakness   Hypokalemia   CAD (coronary artery disease), native coronary artery   Aphasia   Cerebral thrombosis with cerebral infarction   Acute ischemic stroke: Has left MCA subcortical infarct likely due to high-grade stenosis of left M1.  MRI head initially did not show any acute brain findings.  MRA head showed 6 mm short segment severe stenosis involving the mid distal left M1 segment.  CT head and neck showed compensated severe left MCA M1 stenosis, severe left vertebral V4 segment stenosis, moderate to severe bilateral PCA stenosis.  Also showed bilateral carotid bifurcation stenosis affecting ICA: 60% on the right and 55 to 60% on the left.  Last MRI showed multiple puncture  the stroke at left basal ganglia and corona radiata.  Because of his stroke is most likely due to large vessel disease of high-grade left MCA stenosis. Neurology was consulted and was following.  Recommended aspirin 325 mg and Plavix for 3 months followed by Plavix alone.  She needs to follow-up with [facility] neurology as an outpatient. Stroke work-up completed.  Hemoglobin A1c of 5.6.  LDL of 120. Echocardiogram showed ejection fraction of 65%, no cardiac source of emboli. PT/OT recommending CIR versus skilled nursing facility. Speech therapy suspecting dysphagia.  Started on dysphagia 3 diet.  COPD exacerbation:  Treated with Solu-Medrol and azithromycin.  Chest x-ray did not show pneumonia or any other acute findings.  Continue mucolytics.  She is not on oxygen at home.  Continue bronchodilators.  Chest x-ray did not show any pneumonia. Discussed with the granddaughter, as per her ,patient has chronic congestion and smoker's cough.  However wheezes like auscultation findings are most likely upper airway related.  Steroids changed to oral.  Hypertension: Currently on Norvasc, Toprol, Imdur.  She is still mildly hypertensive.  Continue current medications and continue to monitor blood pressure.  Hyperlipidemia: LDL of 120.  She has a statin intolerance.  Continue Zetia.  Coronary artery disease: Status post stenting to the RCA.  Denies any chest pain.  Continue current medications  CKD stage IIIa: Chronic and appears stable.  Tobacco abuse: Currently smokes.  Smoking cessation counseled.  Patient is willing to quit.  Severe malnutrition: BMI of 17.16            DVT prophylaxis:Heparin [place] Code Status: Full Family Communication: Discussed with granddaughter on phone on 07/24/19  Status is: Inpatient  Remains inpatient appropriate because:Unsafe d/c plan   Dispo: The patient is from: Home              Anticipated d/c is to:SNF              Anticipated d/c date is: On the day of  bed availability at a skilled nursing facility Medically stable for DC as soon as bed is available        Consultants: Neurology  Procedures: None  Antimicrobials:  Anti-infectives (From admission, onward)   Start     Dose/Rate Route Frequency Ordered Stop   07/23/19 1000  azithromycin (ZITHROMAX) tablet 500 mg     Discontinue     500 mg Oral Daily 07/23/19 0858 07/26/19 0959      Subjective: Patient seen and examined at the bedside this morning.  Hemodynamically stable.  On room air.  No new changes from yesterday.  Eating her breakfast  Objective: Vitals:   07/24/19 2040 07/24/19 2051 07/25/19 0029 07/25/19 0403  BP:  (!) 152/66 (!) 164/64 (!) 157/60  Pulse:  (!) 49 (!) 49 (!) 50  Resp:  18 20 18   Temp:  97.6 F (36.4 C) (!) 97.5 F (36.4 C) (!) 97.4 F (36.3 C)  TempSrc:  Oral Oral Oral  SpO2: 95% 100% 98% 98%  Weight:      Height:        Intake/Output Summary (Last 24 hours) at 07/25/2019 0737 Last data filed at 07/24/2019 1746 Gross per 24 hour  Intake --  Output 425 ml  Net -425 ml   Filed Weights   07/19/19 1743  Weight: 45.4 kg    Examination:  General exam: Deconditioned, debilitated elderly female  Respiratory system: Bilateral coarse breathing sound, wheezes  cardiovascular system: S1 & S2 heard, RRR. No JVD, murmurs, rubs, gallops or clicks. Gastrointestinal system: Abdomen is nondistended, soft and nontender. No organomegaly or masses felt. Normal bowel sounds heard. Central nervous system: Alert and oriented. No focal neurological deficits. Extremities: No edema,  Skin: Scattered ecchymosis, No ulcers,no icterus ,no pallor   Data Reviewed: I have personally reviewed following labs and imaging studies  CBC: Recent Labs  Lab 07/19/19 1825 07/19/19 1918 07/20/19 0407  WBC  --  7.5 8.3  NEUTROABS  --  5.2  --   HGB 13.6 13.4 14.6  HCT 40.0 42.8 45.9  MCV  --  94.7 93.7  PLT  --  178 161   Basic Metabolic Panel: Recent Labs  Lab  07/19/19 1755 07/19/19 1825 07/20/19 0407 07/22/19 0841  NA 144 145 139 143  K 3.4* 3.4* 3.6 3.5  CL 108 105 104 106  CO2 26  --  26 28  GLUCOSE 90 88 114* 107*  BUN 25* 30* 17 19  CREATININE 1.23* 1.30* 0.90 0.97  CALCIUM 9.4  --  8.9 9.1  MG  --   --  1.7  --   PHOS  --   --  2.2* 3.7   GFR: Estimated Creatinine Clearance: 28.2 mL/min (by C-G formula based on SCr of 0.97 mg/dL). Liver Function Tests: Recent Labs  Lab 07/19/19 1755  AST 20  ALT 12  ALKPHOS 77  BILITOT 0.6  PROT 6.0*  ALBUMIN 3.3*   No results for input(s): LIPASE, AMYLASE in the last 168 hours. No results for input(s): AMMONIA in the last 168 hours. Coagulation Profile: Recent Labs  Lab 07/19/19 1755  INR 1.0   Cardiac Enzymes: No results  for input(s): CKTOTAL, CKMB, CKMBINDEX, TROPONINI in the last 168 hours. BNP (last 3 results) No results for input(s): PROBNP in the last 8760 hours. HbA1C: No results for input(s): HGBA1C in the last 72 hours. CBG: No results for input(s): GLUCAP in the last 168 hours. Lipid Profile: No results for input(s): CHOL, HDL, LDLCALC, TRIG, CHOLHDL, LDLDIRECT in the last 72 hours. Thyroid Function Tests: No results for input(s): TSH, T4TOTAL, FREET4, T3FREE, THYROIDAB in the last 72 hours. Anemia Panel: No results for input(s): VITAMINB12, FOLATE, FERRITIN, TIBC, IRON, RETICCTPCT in the last 72 hours. Sepsis Labs: No results for input(s): PROCALCITON, LATICACIDVEN in the last 168 hours.  Recent Results (from the past 240 hour(s))  SARS Coronavirus 2 by RT PCR (hospital order, performed in [facility] hospital lab) Nasopharyngeal Nasopharyngeal Swab     Status: None   Collection Time: 07/19/19 10:53 PM   Specimen: Nasopharyngeal Swab  Result Value Ref Range Status   SARS Coronavirus 2 NEGATIVE NEGATIVE Final    Comment: (NOTE) SARS-CoV-2 target nucleic acids are NOT DETECTED.  The SARS-CoV-2 RNA is generally detectable in upper and lower respiratory  specimens during the acute phase of infection. The lowest concentration of SARS-CoV-2 viral copies this assay can detect is 250 copies / mL. A negative result does not preclude SARS-CoV-2 infection and should not be used as the sole basis for treatment or other patient management decisions.  A negative result may occur with improper specimen collection / handling, submission of specimen other than nasopharyngeal swab, presence of viral mutation(s) within the areas targeted by this assay, and inadequate number of viral copies (<250 copies / mL). A negative result must be combined with clinical observations, patient history, and epidemiological information.  Fact Sheet for Patients:   StrictlyIdeas.no  Fact Sheet for Healthcare Providers: BankingDealers.co.za  This test is not yet approved or  cleared by the [place] FDA and has been authorized for detection and/or diagnosis of SARS-CoV-2 by FDA under an Emergency Use Authorization (EUA).  This EUA will remain in effect (meaning this test can be used) for the duration of the COVID-19 declaration under Section 564(b)(1) of the Act, 21 U.S.C. section 360bbb-3(b)(1), unless the authorization is terminated or revoked sooner.  Performed at [facility], [place] [address] [place], [place] [address]          Radiology Studies: DG CHEST PORT 1 VIEW  Result Date: 07/24/2019 CLINICAL DATA:  Shortness of breath. EXAM: PORTABLE CHEST 1 VIEW COMPARISON:  July 21, 2019. FINDINGS: The heart size and mediastinal contours are within normal limits. No pneumothorax or pleural effusion is noted. Atherosclerosis of thoracic aorta is noted. Both lungs are clear. The visualized skeletal structures are unremarkable. IMPRESSION: No active disease. Aortic Atherosclerosis (ICD10-I70.0). Electronically Signed   By: Marijo Conception M.D.   On: 07/24/2019 14:48   DG Swallowing Func-Speech  Pathology  Result Date: 07/24/2019 Objective Swallowing Evaluation: Type of Study: MBS-Modified Barium Swallow Study  Patient Details Name: [name] [name] MRN: [id number] Date of Birth: 04-04-1927 Today's Date: 07/24/2019 Time: SLP Start Time (ACUTE ONLY): 1215 -SLP Stop Time (ACUTE ONLY): 1245 SLP Time Calculation (min) (ACUTE ONLY): 30 min Past Medical History: Past Medical History: Diagnosis Date . CARCINOMA, BASAL CELL 02/13/2006 . CAROTID ARTERY DISEASE 10/03/2006  Carotid US 11/17: Stable 40-59% bilateral ICA stenosis >> f/u 1 year . CHRONIC OBSTRUCTIVE PULMONARY DISEASE, ACUTE EXACERBATION 11/17/2009 . Chronic rhinitis 12/13/2006 . CORONARY ARTERY DISEASE 02/13/2006 . DEGENERATIVE JOINT DISEASE 09/27/2006 . DERMATITIS  09/25/2007 . Head trauma   closed . HYPERLIPIDEMIA 03/10/2008 . HYPERTENSION 02/13/2006 . HYPOTHYROIDISM 01/30/2007 . LIVER FUNCTION TESTS, ABNORMAL 01/22/2009 . LOSS, HEARING NOS 08/25/2006 . Near syncope 09/18/2014 . NEOPLASM, SKIN, UNCERTAIN BEHAVIOR 37/62/8315 . NEPHROLITHIASIS, HX OF 02/27/2010 . OSTEOARTHRITIS, HAND 12/16/2008 . PERIPHERAL VASCULAR DISEASE 02/13/2006 . Personal history of malignant neoplasm of breast 07/24/2008 . Personal history of urinary calculi  . Post-traumatic wound infection 12/01/2010  Mild streaking superiorly and on one day of septra  Will give rocephin and add keflex  adn follow up  . POSTHERPETIC NEURALGIA 08/25/2006 . RENAL ARTERY STENOSIS 08/01/2008 . Shingles   recurrent . SPINAL STENOSIS 11/01/2006 . THROMBOCYTOPENIA 07/30/2008 . UNS ADVRS EFF UNS RX MEDICINAL&BIOLOGICAL SBSTNC 01/30/2007 . UNSPECIFIED ANEMIA 03/18/2008 . Unspecified vitamin D deficiency 02/07/2007 . UNSTABLE ANGINA 12/12/2009 . Vitreous detachment  Past Surgical History: Past Surgical History: Procedure Laterality Date . ABDOMINAL HYSTERECTOMY   . BREAST LUMPECTOMY   . BREAST LUMPECTOMY  1998 . CATARACT EXTRACTION   . CORONARY ANGIOPLASTY WITH STENT PLACEMENT    bilat iliac stents, left renal artery  and rt coronary artery last myoview 8/09 with EF 70% . LUMBAR LAMINECTOMY   . SKIN BIOPSY  08/24/2017  Invasive squamous cell carcinoma . SKIN CANCER EXCISION   . TONSILLECTOMY   HPI: [name] [name] is a 86 y.o. female who presents to the ED for evaluation of left-sided weakness and intermittent expressive aphasia.  MRI showed multiple punctate strokes at left BG and corona radiata. Initial bedside swallow eval was completed 6/18. Patient reported to have worsening of right sided weakness-->now flaccid with significant deficits.  Subjective: alert Assessment / Plan / Recommendation CHL IP CLINICAL IMPRESSIONS 07/24/2019 Clinical Impression Pt presents with moderate oropharyngeal dysphagia with intermittent aspiration of thin liquids before/during the swallow response - aspiration was not consistently accompanied by a cough response - it was occasionally silent in nature.  There was prolonged and decreased coordination of the oral phase of the swallow, with spillage from right side of oral cavity and premature spillage over base of tongue.  Aspiration was generally due to a problem with timing of laryngeal vestibule closure.  Nectar-thick liquids allowed for improved timing and no observed penetration/aspiration.  Pt had difficulty following commands for postural/strategic adjustments.  Anatomy was remarkable for degenerative changes in the cervical spine - this did not impact function. Calcified plaque in the arteries was also notable.  Recommend changing diet to dysphagia 3, nectar-thick liquids. Treatment should focus on improving coordination/sensory reception within oral cavity as well as timing of pharyngeal response.   SLP Visit Diagnosis Dysphagia, oropharyngeal phase (R13.12) Attention and concentration deficit following -- Frontal lobe and executive function deficit following -- Impact on safety and function Moderate aspiration risk   CHL IP TREATMENT RECOMMENDATION 07/24/2019 Treatment Recommendations  Therapy as outlined in treatment plan below   Prognosis 07/20/2019 Prognosis for Safe Diet Advancement Good Barriers to Reach Goals -- Barriers/Prognosis Comment -- CHL IP DIET RECOMMENDATION 07/24/2019 SLP Diet Recommendations Dysphagia 3 (Mech soft) solids;Nectar thick liquid Liquid Administration via Cup Medication Administration Crushed with puree Compensations Slow rate;Small sips/bites Postural Changes Remain semi-upright after after feeds/meals (Comment)   CHL IP OTHER RECOMMENDATIONS 07/24/2019 Recommended Consults -- Oral Care Recommendations Oral care BID Other Recommendations Order thickener from pharmacy   CHL IP FOLLOW UP RECOMMENDATIONS 07/24/2019 Follow up Recommendations Skilled Nursing facility   [facility] IP FREQUENCY AND DURATION 07/24/2019 Speech Therapy Frequency (ACUTE ONLY) min 2x/week Treatment Duration 1 week  CHL IP ORAL PHASE 07/24/2019 Oral Phase Impaired Oral - Pudding Teaspoon -- Oral - Pudding Cup -- Oral - Honey Teaspoon -- Oral - Honey Cup -- Oral - Nectar Teaspoon -- Oral - Nectar Cup Right anterior bolus loss;Weak lingual manipulation;Right pocketing in lateral sulci;Delayed oral transit;Decreased bolus cohesion Oral - Nectar Straw -- Oral - Thin Teaspoon Right anterior bolus loss;Weak lingual manipulation;Right pocketing in lateral sulci;Delayed oral transit;Decreased bolus cohesion Oral - Thin Cup Right anterior bolus loss;Weak lingual manipulation;Right pocketing in lateral sulci;Delayed oral transit;Decreased bolus cohesion Oral - Thin Straw Right anterior bolus loss;Weak lingual manipulation;Right pocketing in lateral sulci;Delayed oral transit;Decreased bolus cohesion Oral - Puree Weak lingual manipulation;Right pocketing in lateral sulci;Delayed oral transit;Decreased bolus cohesion Oral - Mech Soft -- Oral - Regular Weak lingual manipulation;Right pocketing in lateral sulci;Delayed oral transit;Decreased bolus cohesion Oral - Multi-Consistency -- Oral - Pill -- Oral Phase - Comment  --  CHL IP PHARYNGEAL PHASE 07/24/2019 Pharyngeal Phase Impaired Pharyngeal- Pudding Teaspoon -- Pharyngeal -- Pharyngeal- Pudding Cup -- Pharyngeal -- Pharyngeal- Honey Teaspoon -- Pharyngeal -- Pharyngeal- Honey Cup -- Pharyngeal -- Pharyngeal- Nectar Teaspoon -- Pharyngeal -- Pharyngeal- Nectar Cup Delayed swallow initiation-vallecula;Delayed swallow initiation-pyriform sinuses;Reduced tongue base retraction Pharyngeal -- Pharyngeal- Nectar Straw -- Pharyngeal -- Pharyngeal- Thin Teaspoon Delayed swallow initiation-pyriform sinuses Pharyngeal -- Pharyngeal- Thin Cup Delayed swallow initiation-pyriform sinuses;Reduced airway/laryngeal closure;Penetration/Aspiration before swallow;Penetration/Aspiration during swallow;Trace aspiration;Pharyngeal residue - valleculae Pharyngeal Material enters airway, passes BELOW cords and not ejected out despite cough attempt by patient;Material enters airway, passes BELOW cords without attempt by patient to eject out (silent aspiration) Pharyngeal- Thin Straw Delayed swallow initiation-pyriform sinuses;Reduced airway/laryngeal closure;Penetration/Aspiration before swallow;Penetration/Aspiration during swallow;Trace aspiration;Pharyngeal residue - valleculae Pharyngeal Material enters airway, passes BELOW cords and not ejected out despite cough attempt by patient;Material enters airway, passes BELOW cords without attempt by patient to eject out (silent aspiration) Pharyngeal- Puree Delayed swallow initiation-vallecula;Pharyngeal residue - valleculae Pharyngeal -- Pharyngeal- Mechanical Soft -- Pharyngeal -- Pharyngeal- Regular Delayed swallow initiation-vallecula;Pharyngeal residue - valleculae Pharyngeal -- Pharyngeal- Multi-consistency -- Pharyngeal -- Pharyngeal- Pill -- Pharyngeal -- Pharyngeal Comment --  CHL IP CERVICAL ESOPHAGEAL PHASE 02/04/2017 Cervical Esophageal Phase (No Data) Pudding Teaspoon -- Pudding Cup -- Honey Teaspoon -- Honey Cup -- Nectar Teaspoon -- Nectar Cup  -- Nectar Straw -- Thin Teaspoon -- Thin Cup -- Thin Straw -- Puree -- Mechanical Soft -- Regular -- Multi-consistency -- Pill -- Cervical Esophageal Comment -- Juan Quam Laurice 07/24/2019, 1:28 PM                   Scheduled Meds: . amLODipine  10 mg Oral Daily  . aspirin EC  325 mg Oral Daily  . azithromycin  500 mg Oral Daily  . budesonide  1 mg Inhalation BID  . clopidogrel  75 mg Oral Daily  . ezetimibe  10 mg Oral Daily  . glycopyrrolate  1 mg Oral BID  . guaiFENesin  1,200 mg Oral BID  . heparin  5,000 Units Subcutaneous Q8H  . isosorbide mononitrate  30 mg Oral Daily  . methylPREDNISolone (SOLU-MEDROL) injection  40 mg Intravenous Q12H  . metoprolol succinate  25 mg Oral Daily  . polyvinyl alcohol  1 drop Both Eyes QID  . umeclidinium bromide  1 puff Inhalation Daily   Continuous Infusions:   LOS: 5 days    Time spent: 25 mins,More than 50% of that time was spent in counseling and/or coordination of care.      Shelly Coss, MD [facility]  P6/23/2021, 7:37 AM

## 2019-07-25 NOTE — TOC Progression Note (Signed)
Transition of Care [facility]) - Progression Note    Patient Details  Name: [name] [name] MRN: [id number] Date of Birth: 1927/11/04  Transition of Care [facility]) CM/SW [place], [place] Phone Number: 07/25/2019, 4:24 PM  Clinical Narrative:   CSW attempted to contact patient's granddaughter to discuss bed offers. Left a voicemail.    Expected Discharge Plan: [place] Barriers to Discharge: Continued Medical Work up  Expected Discharge Plan and Services Expected Discharge Plan: [place] arrangements for the past 2 months: Single Family Home                                       Social Determinants of Health (SDOH) Interventions    Readmission Risk Interventions No flowsheet data found.

## 2019-07-25 NOTE — Progress Notes (Signed)
Physical Therapy Treatment Patient Details Name: [name] [name] MRN: [id number] DOB: Apr 20, 1927 Today's Date: 07/25/2019    History of Present Illness Pt is an 86 y/o female who presents with weakness and aphasia. 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    PT Comments    Seen in conjunction with OT to maximize functional potential. Pt tolerated EOB activity ~15 minutes and overall demonstrated good rehab effort. Pt continues to be flaccid in the RUE/RLE. Today, pt participated in ADL task and trunk stabilization activity. Max assist provided for sitting balance due to posterior and right lateral lean. We opted for bed in chair position in lieu of the recliner from a safety perspective due to heavy R lateral lean. Will continue to follow.    Follow Up Recommendations  SNF;Supervision/Assistance - 24 hour     Equipment Recommendations  Other (comment) (TBD by next venue of care)    Recommendations for Other Services       Precautions / Restrictions Precautions Precautions: Fall Precaution Comments: Questionable R shoulder subluxation Restrictions Weight Bearing Restrictions: No    Mobility  Bed Mobility Overal bed mobility: Needs Assistance Bed Mobility: Rolling;Sidelying to Sit;Sit to Sidelying Rolling: Mod assist;Max assist Sidelying to sit: Total assist;+2 for physical assistance     Sit to sidelying: Max assist;+2 for physical assistance General bed mobility comments: Multimodal cues to attend to the R side and look past midline at the railing. Hand-over-hand assist to reach for R railing with L hand. Mod assist to roll R, max assist to roll left, and +2 assist for transition into sidelying and elevate trunk to full sitting position.   Transfers                 General transfer comment: Deferred transfer OOB to chair. From a safety perspective, opted for bed in  chair position due to R lateral lean.  Ambulation/Gait             General Gait Details: Unable   Stairs             Wheelchair Mobility    Modified Rankin (Stroke Patients Only) Modified Rankin (Stroke Patients Only) Pre-Morbid Rankin Score: No symptoms Modified Rankin: Severe disability     Balance Overall balance assessment: Needs assistance Sitting-balance support: Feet supported;Single extremity supported Sitting balance-Leahy Scale: Zero Sitting balance - Comments: Max assist required to maintain upright posture and to prevent R lateral lean       Standing balance comment: Not tested                            Cognition Arousal/Alertness: Awake/alert Behavior During Therapy: Flat affect Overall Cognitive Status: Impaired/Different from baseline Area of Impairment: Orientation;Attention;Memory;Following commands;Safety/judgement;Awareness;Problem solving                 Orientation Level:  (Not answering orientation questions) Current Attention Level: Focused Memory: Decreased short-term memory Following Commands: Follows one step commands inconsistently;Follows one step commands with increased time Safety/Judgement: Decreased awareness of safety Awareness: Intellectual Problem Solving: Slow processing;Decreased initiation;Difficulty sequencing;Requires verbal cues;Requires tactile cues        Exercises Other Exercises Other Exercises: Trunk stabilization activity - lean onto L elbow and push back to sitting position x2, anterior trunk lean x2, LLE LAQ x5, RLE LAQ x5 passive    General Comments        Pertinent  Vitals/Pain Pain Assessment: Faces Faces Pain Scale: Hurts little more Pain Location: General movement with transitions to/from EOB Pain Descriptors / Indicators: Grimacing;Moaning Pain Intervention(s): Limited activity within patient's tolerance;Monitored during session;Repositioned    Home Living                       Prior Function            PT Goals (current goals can now be found in the care plan section) Acute Rehab PT Goals Patient Stated Goal: None specifically stated PT Goal Formulation: Patient unable to participate in goal setting Time For Goal Achievement: 08/08/19 Potential to Achieve Goals: Fair Progress towards PT goals: Progressing toward goals    Frequency    Min 3X/week      PT Plan Current plan remains appropriate    Co-evaluation PT/OT/SLP Co-Evaluation/Treatment: Yes Reason for Co-Treatment: For patient/therapist safety;To address functional/ADL transfers (Very low level)          AM-PAC PT "6 Clicks" Mobility   Outcome Measure  Help needed turning from your back to your side while in a flat bed without using bedrails?: A Lot Help needed moving from lying on your back to sitting on the side of a flat bed without using bedrails?: Total Help needed moving to and from a bed to a chair (including a wheelchair)?: Total Help needed standing up from a chair using your arms (e.g., wheelchair or bedside chair)?: Total Help needed to walk in hospital room?: Total Help needed climbing 3-5 steps with a railing? : Total 6 Click Score: 7    End of Session Equipment Utilized During Treatment: Gait belt Activity Tolerance: Patient tolerated treatment well Patient left: in chair Nurse Communication: Mobility status;Need for lift equipment (Maxi-move) PT Visit Diagnosis: Unsteadiness on feet (R26.81);Other symptoms and signs involving the nervous system (R29.898)     Time: 7902-4097 PT Time Calculation (min) (ACUTE ONLY): 28 min  Charges:  $Neuromuscular Re-education: 8-22 mins                     [name], PT, DPT Acute Rehabilitation Services Pager: [phone] Office: [phone]    [name] 07/25/2019, 12:57 PM

## 2019-07-26 LAB — BASIC METABOLIC PANEL
Anion gap: 13 (ref 5–15)
BUN: 58 mg/dL — ABNORMAL HIGH (ref 8–23)
CO2: 22 mmol/L (ref 22–32)
Calcium: 9.5 mg/dL (ref 8.9–10.3)
Chloride: 113 mmol/L — ABNORMAL HIGH (ref 98–111)
Creatinine, Ser: 1.21 mg/dL — ABNORMAL HIGH (ref 0.44–1.00)
GFR calc Af Amer: 46 mL/min — ABNORMAL LOW (ref >60)
GFR calc non Af Amer: 40 mL/min — ABNORMAL LOW (ref >60)
Glucose, Bld: 102 mg/dL — ABNORMAL HIGH (ref 70–99)
Potassium: 3.3 mmol/L — ABNORMAL LOW (ref 3.5–5.1)
Sodium: 148 mmol/L — ABNORMAL HIGH (ref 135–145)

## 2019-07-26 LAB — TROPONIN I (HIGH SENSITIVITY): Troponin I (High Sensitivity): 14 ng/L (ref <18)

## 2019-07-26 MED ORDER — POTASSIUM CHLORIDE CRYS ER 20 MEQ PO TBCR
40.0000 meq | EXTENDED_RELEASE_TABLET | Freq: Once | ORAL | Status: AC
  Administered 2019-07-26: 40 meq via ORAL
  Filled 2019-07-26: qty 2

## 2019-07-26 NOTE — Progress Notes (Addendum)
PROGRESS NOTE    [name] [name]  ION:[id number] DOB: 25-Feb-1927 DOA: 07/19/2019 PCP: Burnis Medin, MD   Brief Narrative: Patient is a 86 year old female with history of coronary disease status post stenting to RCA in 2005, peripheral vascular disease, COPD, CKD stage IIIa, hypertension, hyperlipidemia, carotid stenosis who presented to the emergency department for the evaluation of left-sided weakness and intermittent expressive aphasia.  Initial CT head/MRI of the brain did not show any stroke.  Later in the hospital, she developed right-sided weakness.  Repeat MRI showed acute infarct in the left coronary rate and basal ganglia.  Neurology was consulted and following.  Stroke workup completed.  Neurology recommended dual antiplatelet therapy with aspirin Plavix for 3 months followed by Plavix monotherapy because of severe left MCA stenosis.  PT/OT recommending  skilled nursing facility.  Patient is medically stable for discharge as soon as bed is available.  Assessment & Plan:   Principal Problem:   Acute ischemic left MCA stroke (HCC) Active Problems:   Hyperlipidemia   Essential hypertension, benign   Peripheral vascular disease (HCC)   COPD (chronic obstructive pulmonary disease) (HCC)   Left-sided weakness   Hypokalemia   CAD (coronary artery disease), native coronary artery   Aphasia   Cerebral thrombosis with cerebral infarction   Acute ischemic stroke: Has left MCA subcortical infarct likely due to high-grade stenosis of left M1.  MRI head initially did not show any acute brain findings.  MRA head showed 6 mm short segment severe stenosis involving the mid distal left M1 segment.  CT head and neck showed compensated severe left MCA M1 stenosis, severe left vertebral V4 segment stenosis, moderate to severe bilateral PCA stenosis.  Also showed bilateral carotid bifurcation stenosis affecting ICA: 60% on the right and 55 to 60% on the left.  Last MRI showed multiple puncture  the stroke at left basal ganglia and corona radiata.  Because of his stroke is most likely due to large vessel disease of high-grade left MCA stenosis. Neurology was consulted and was following.  Recommended aspirin 325 mg and Plavix for 3 months followed by Plavix alone.  She needs to follow-up with [facility] neurology as an outpatient. Stroke work-up completed.  Hemoglobin A1c of 5.6.  LDL of 120. Echocardiogram showed ejection fraction of 65%, no cardiac source of emboli. PT/OT recommending CIR versus skilled nursing facility. Speech therapy suspected dysphagia.  Started on dysphagia 3 diet.  COPD exacerbation:  Treated with Solu-Medrol and azithromycin.  Chest x-ray did not show pneumonia or any other acute findings.  Continue mucolytics.  She is not on oxygen at home.  Continue bronchodilators. Discussed with the granddaughter, as per her ,patient has chronic congestion and smoker's cough.  However wheezes like auscultation findings are most likely upper airway related.  Steroids changed to oral.  Hypertension: Currently on Norvasc, Toprol, Imdur.  She is still mildly hypertensive.  Continue current medications and continue to monitor blood pressure.  Hyperlipidemia: LDL of 120.  She has a statin intolerance.  Continue Zetia.  Coronary artery disease: Status post stenting to the RCA.  Denies any chest pain.  Continue current medications  CKD stage IIIa: Chronic and appears stable.  Tobacco abuse: Currently smokes.  Smoking cessation counseled.  Patient is willing to quit.  Severe malnutrition: BMI of 17.16            DVT prophylaxis:Heparin Santel Code Status: Full Family Communication: Discussed with granddaughter on phone on 07/24/19 Status is: Inpatient  Remains inpatient appropriate because:Unsafe  d/c plan   Dispo: The patient is from: Home              Anticipated d/c is to:SNF              Anticipated d/c date is: On the day of bed availability at a skilled nursing  facility Medically stable for DC as soon as bed is available        Consultants: Neurology  Procedures: None  Antimicrobials:  Anti-infectives (From admission, onward)   Start     Dose/Rate Route Frequency Ordered Stop   07/23/19 1000  azithromycin (ZITHROMAX) tablet 500 mg        500 mg Oral Daily 07/23/19 0858 07/25/19 1013      Subjective: Patient seen and examined at the bedside this morning.  Last night she complained of chest pain.  Currently chest pain-free.  Respiratory status stable.CXR last night did not  show any acute findings  Objective: Vitals:   07/25/19 1900 07/25/19 2050 07/25/19 2320 07/26/19 0300  BP: (!) 138/58  (!) 120/46 (!) 158/51  Pulse: (!) 49  (!) 47 (!) 50  Resp: 18  16 20   Temp: (!) 97.5 F (36.4 C)  97.6 F (36.4 C) (!) 97.4 F (36.3 C)  TempSrc: Oral  Oral Axillary  SpO2: 95% 95% 95% 96%  Weight:      Height:        Intake/Output Summary (Last 24 hours) at 07/26/2019 7564 Last data filed at 07/25/2019 2100 Gross per 24 hour  Intake 100 ml  Output 425 ml  Net -325 ml   Filed Weights   07/19/19 1743  Weight: 45.4 kg    Examination:  General exam: Deconditioned, debilitated elderly female  Respiratory system: Bilateral coarse breathing sounds, rhonchi, mild wheezes  cardiovascular system: S1 & S2 heard, RRR. No JVD, murmurs, rubs, gallops or clicks. Gastrointestinal system: Abdomen is nondistended, soft and nontender. No organomegaly or masses felt. Normal bowel sounds heard. Central nervous system: Alert and oriented. No focal neurological deficits. Extremities: No edema, no clubbing ,no cyanosis Skin: Scattered ecchymosis, no ulcers    Data Reviewed: I have personally reviewed following labs and imaging studies  CBC: Recent Labs  Lab 07/19/19 1825 07/19/19 1918 07/20/19 0407  WBC  --  7.5 8.3  NEUTROABS  --  5.2  --   HGB 13.6 13.4 14.6  HCT 40.0 42.8 45.9  MCV  --  94.7 93.7  PLT  --  178 332   Basic Metabolic  Panel: Recent Labs  Lab 07/19/19 1755 07/19/19 1825 07/20/19 0407 07/22/19 0841 07/25/19 1949  NA 144 145 139 143 145  K 3.4* 3.4* 3.6 3.5 3.4*  CL 108 105 104 106 111  CO2 26  --  26 28 21*  GLUCOSE 90 88 114* 107* 180*  BUN 25* 30* 17 19 66*  CREATININE 1.23* 1.30* 0.90 0.97 1.44*  CALCIUM 9.4  --  8.9 9.1 9.5  MG  --   --  1.7  --  2.5*  PHOS  --   --  2.2* 3.7 3.8   GFR: Estimated Creatinine Clearance: 19 mL/min (A) (by C-G formula based on SCr of 1.44 mg/dL (H)). Liver Function Tests: Recent Labs  Lab 07/19/19 1755 07/25/19 1949  AST 20  --   ALT 12  --   ALKPHOS 77  --   BILITOT 0.6  --   PROT 6.0*  --   ALBUMIN 3.3* 2.8*   No results for input(s):  LIPASE, AMYLASE in the last 168 hours. No results for input(s): AMMONIA in the last 168 hours. Coagulation Profile: Recent Labs  Lab 07/19/19 1755  INR 1.0   Cardiac Enzymes: No results for input(s): CKTOTAL, CKMB, CKMBINDEX, TROPONINI in the last 168 hours. BNP (last 3 results) No results for input(s): PROBNP in the last 8760 hours. HbA1C: No results for input(s): HGBA1C in the last 72 hours. CBG: No results for input(s): GLUCAP in the last 168 hours. Lipid Profile: No results for input(s): CHOL, HDL, LDLCALC, TRIG, CHOLHDL, LDLDIRECT in the last 72 hours. Thyroid Function Tests: No results for input(s): TSH, T4TOTAL, FREET4, T3FREE, THYROIDAB in the last 72 hours. Anemia Panel: No results for input(s): VITAMINB12, FOLATE, FERRITIN, TIBC, IRON, RETICCTPCT in the last 72 hours. Sepsis Labs: No results for input(s): PROCALCITON, LATICACIDVEN in the last 168 hours.  Recent Results (from the past 240 hour(s))  SARS Coronavirus 2 by RT PCR (hospital order, performed in [facility] hospital lab) Nasopharyngeal Nasopharyngeal Swab     Status: None   Collection Time: 07/19/19 10:53 PM   Specimen: Nasopharyngeal Swab  Result Value Ref Range Status   SARS Coronavirus 2 NEGATIVE NEGATIVE Final    Comment:  (NOTE) SARS-CoV-2 target nucleic acids are NOT DETECTED.  The SARS-CoV-2 RNA is generally detectable in upper and lower respiratory specimens during the acute phase of infection. The lowest concentration of SARS-CoV-2 viral copies this assay can detect is 250 copies / mL. A negative result does not preclude SARS-CoV-2 infection and should not be used as the sole basis for treatment or other patient management decisions.  A negative result may occur with improper specimen collection / handling, submission of specimen other than nasopharyngeal swab, presence of viral mutation(s) within the areas targeted by this assay, and inadequate number of viral copies (<250 copies / mL). A negative result must be combined with clinical observations, patient history, and epidemiological information.  Fact Sheet for Patients:   StrictlyIdeas.no  Fact Sheet for Healthcare Providers: BankingDealers.co.za  This test is not yet approved or  cleared by the [place] FDA and has been authorized for detection and/or diagnosis of SARS-CoV-2 by FDA under an Emergency Use Authorization (EUA).  This EUA will remain in effect (meaning this test can be used) for the duration of the COVID-19 declaration under Section 564(b)(1) of the Act, 21 U.S.C. section 360bbb-3(b)(1), unless the authorization is terminated or revoked sooner.  Performed at [facility], [place] [address] [place], [place] [address]          Radiology Studies: DG Chest 1 View  Result Date: 07/25/2019 CLINICAL DATA:  86 year old female with shortness of breath. EXAM: CHEST  1 VIEW COMPARISON:  Chest radiograph dated 07/24/2019. FINDINGS: No focal consolidation, pleural effusion or pneumothorax. The cardiac silhouette is within limits. Atherosclerotic calcification of the aorta. No acute osseous pathology. Degenerative changes of the spine. IMPRESSION: No active cardiopulmonary disease.  Electronically Signed   By: Anner Crete M.D.   On: 07/25/2019 21:35   DG CHEST PORT 1 VIEW  Result Date: 07/24/2019 CLINICAL DATA:  Shortness of breath. EXAM: PORTABLE CHEST 1 VIEW COMPARISON:  July 21, 2019. FINDINGS: The heart size and mediastinal contours are within normal limits. No pneumothorax or pleural effusion is noted. Atherosclerosis of thoracic aorta is noted. Both lungs are clear. The visualized skeletal structures are unremarkable. IMPRESSION: No active disease. Aortic Atherosclerosis (ICD10-I70.0). Electronically Signed   By: Marijo Conception M.D.   On: 07/24/2019 14:48  DG Swallowing Func-Speech Pathology  Result Date: 07/24/2019 Objective Swallowing Evaluation: Type of Study: MBS-Modified Barium Swallow Study  Patient Details Name: BYRDIE MIYAZAKI MRN: [id number] Date of Birth: 1927/03/21 Today's Date: 07/24/2019 Time: SLP Start Time (ACUTE ONLY): 1215 -SLP Stop Time (ACUTE ONLY): 1245 SLP Time Calculation (min) (ACUTE ONLY): 30 min Past Medical History: Past Medical History: Diagnosis Date . CARCINOMA, BASAL CELL 02/13/2006 . CAROTID ARTERY DISEASE 10/03/2006  Carotid US 11/17: Stable 40-59% bilateral ICA stenosis >> f/u 1 year . CHRONIC OBSTRUCTIVE PULMONARY DISEASE, ACUTE EXACERBATION 11/17/2009 . Chronic rhinitis 12/13/2006 . CORONARY ARTERY DISEASE 02/13/2006 . DEGENERATIVE JOINT DISEASE 09/27/2006 . DERMATITIS 09/25/2007 . Head trauma   closed . HYPERLIPIDEMIA 03/10/2008 . HYPERTENSION 02/13/2006 . HYPOTHYROIDISM 01/30/2007 . LIVER FUNCTION TESTS, ABNORMAL 01/22/2009 . LOSS, HEARING NOS 08/25/2006 . Near syncope 09/18/2014 . NEOPLASM, SKIN, UNCERTAIN BEHAVIOR 02/72/5366 . NEPHROLITHIASIS, HX OF 02/27/2010 . OSTEOARTHRITIS, HAND 12/16/2008 . PERIPHERAL VASCULAR DISEASE 02/13/2006 . Personal history of malignant neoplasm of breast 07/24/2008 . Personal history of urinary calculi  . Post-traumatic wound infection 12/01/2010  Mild streaking superiorly and on one day of septra  Will give  rocephin and add keflex  adn follow up  . POSTHERPETIC NEURALGIA 08/25/2006 . RENAL ARTERY STENOSIS 08/01/2008 . Shingles   recurrent . SPINAL STENOSIS 11/01/2006 . THROMBOCYTOPENIA 07/30/2008 . UNS ADVRS EFF UNS RX MEDICINAL&BIOLOGICAL SBSTNC 01/30/2007 . UNSPECIFIED ANEMIA 03/18/2008 . Unspecified vitamin D deficiency 02/07/2007 . UNSTABLE ANGINA 12/12/2009 . Vitreous detachment  Past Surgical History: Past Surgical History: Procedure Laterality Date . ABDOMINAL HYSTERECTOMY   . BREAST LUMPECTOMY   . BREAST LUMPECTOMY  1998 . CATARACT EXTRACTION   . CORONARY ANGIOPLASTY WITH STENT PLACEMENT    bilat iliac stents, left renal artery and rt coronary artery last myoview 8/09 with EF 70% . LUMBAR LAMINECTOMY   . SKIN BIOPSY  08/24/2017  Invasive squamous cell carcinoma . SKIN CANCER EXCISION   . TONSILLECTOMY   HPI: TAETUM FLEWELLEN is a 86 y.o. female who presents to the ED for evaluation of left-sided weakness and intermittent expressive aphasia.  MRI showed multiple punctate strokes at left BG and corona radiata. Initial bedside swallow eval was completed 6/18. Patient reported to have worsening of right sided weakness-->now flaccid with significant deficits.  Subjective: alert Assessment / Plan / Recommendation CHL IP CLINICAL IMPRESSIONS 07/24/2019 Clinical Impression Pt presents with moderate oropharyngeal dysphagia with intermittent aspiration of thin liquids before/during the swallow response - aspiration was not consistently accompanied by a cough response - it was occasionally silent in nature.  There was prolonged and decreased coordination of the oral phase of the swallow, with spillage from right side of oral cavity and premature spillage over base of tongue.  Aspiration was generally due to a problem with timing of laryngeal vestibule closure.  Nectar-thick liquids allowed for improved timing and no observed penetration/aspiration.  Pt had difficulty following commands for postural/strategic adjustments.   Anatomy was remarkable for degenerative changes in the cervical spine - this did not impact function. Calcified plaque in the arteries was also notable.  Recommend changing diet to dysphagia 3, nectar-thick liquids. Treatment should focus on improving coordination/sensory reception within oral cavity as well as timing of pharyngeal response.   SLP Visit Diagnosis Dysphagia, oropharyngeal phase (R13.12) Attention and concentration deficit following -- Frontal lobe and executive function deficit following -- Impact on safety and function Moderate aspiration risk   CHL IP TREATMENT RECOMMENDATION 07/24/2019 Treatment Recommendations Therapy as outlined in treatment plan below   Prognosis  07/20/2019 Prognosis for Safe Diet Advancement Good Barriers to Reach Goals -- Barriers/Prognosis Comment -- CHL IP DIET RECOMMENDATION 07/24/2019 SLP Diet Recommendations Dysphagia 3 (Mech soft) solids;Nectar thick liquid Liquid Administration via Cup Medication Administration Crushed with puree Compensations Slow rate;Small sips/bites Postural Changes Remain semi-upright after after feeds/meals (Comment)   CHL IP OTHER RECOMMENDATIONS 07/24/2019 Recommended Consults -- Oral Care Recommendations Oral care BID Other Recommendations Order thickener from pharmacy   CHL IP FOLLOW UP RECOMMENDATIONS 07/24/2019 Follow up Recommendations Skilled Nursing facility   [facility] IP FREQUENCY AND DURATION 07/24/2019 Speech Therapy Frequency (ACUTE ONLY) min 2x/week Treatment Duration 1 week      CHL IP ORAL PHASE 07/24/2019 Oral Phase Impaired Oral - Pudding Teaspoon -- Oral - Pudding Cup -- Oral - Honey Teaspoon -- Oral - Honey Cup -- Oral - Nectar Teaspoon -- Oral - Nectar Cup Right anterior bolus loss;Weak lingual manipulation;Right pocketing in lateral sulci;Delayed oral transit;Decreased bolus cohesion Oral - Nectar Straw -- Oral - Thin Teaspoon Right anterior bolus loss;Weak lingual manipulation;Right pocketing in lateral sulci;Delayed oral  transit;Decreased bolus cohesion Oral - Thin Cup Right anterior bolus loss;Weak lingual manipulation;Right pocketing in lateral sulci;Delayed oral transit;Decreased bolus cohesion Oral - Thin Straw Right anterior bolus loss;Weak lingual manipulation;Right pocketing in lateral sulci;Delayed oral transit;Decreased bolus cohesion Oral - Puree Weak lingual manipulation;Right pocketing in lateral sulci;Delayed oral transit;Decreased bolus cohesion Oral - Mech Soft -- Oral - Regular Weak lingual manipulation;Right pocketing in lateral sulci;Delayed oral transit;Decreased bolus cohesion Oral - Multi-Consistency -- Oral - Pill -- Oral Phase - Comment --  CHL IP PHARYNGEAL PHASE 07/24/2019 Pharyngeal Phase Impaired Pharyngeal- Pudding Teaspoon -- Pharyngeal -- Pharyngeal- Pudding Cup -- Pharyngeal -- Pharyngeal- Honey Teaspoon -- Pharyngeal -- Pharyngeal- Honey Cup -- Pharyngeal -- Pharyngeal- Nectar Teaspoon -- Pharyngeal -- Pharyngeal- Nectar Cup Delayed swallow initiation-vallecula;Delayed swallow initiation-pyriform sinuses;Reduced tongue base retraction Pharyngeal -- Pharyngeal- Nectar Straw -- Pharyngeal -- Pharyngeal- Thin Teaspoon Delayed swallow initiation-pyriform sinuses Pharyngeal -- Pharyngeal- Thin Cup Delayed swallow initiation-pyriform sinuses;Reduced airway/laryngeal closure;Penetration/Aspiration before swallow;Penetration/Aspiration during swallow;Trace aspiration;Pharyngeal residue - valleculae Pharyngeal Material enters airway, passes BELOW cords and not ejected out despite cough attempt by patient;Material enters airway, passes BELOW cords without attempt by patient to eject out (silent aspiration) Pharyngeal- Thin Straw Delayed swallow initiation-pyriform sinuses;Reduced airway/laryngeal closure;Penetration/Aspiration before swallow;Penetration/Aspiration during swallow;Trace aspiration;Pharyngeal residue - valleculae Pharyngeal Material enters airway, passes BELOW cords and not ejected out despite  cough attempt by patient;Material enters airway, passes BELOW cords without attempt by patient to eject out (silent aspiration) Pharyngeal- Puree Delayed swallow initiation-vallecula;Pharyngeal residue - valleculae Pharyngeal -- Pharyngeal- Mechanical Soft -- Pharyngeal -- Pharyngeal- Regular Delayed swallow initiation-vallecula;Pharyngeal residue - valleculae Pharyngeal -- Pharyngeal- Multi-consistency -- Pharyngeal -- Pharyngeal- Pill -- Pharyngeal -- Pharyngeal Comment --  CHL IP CERVICAL ESOPHAGEAL PHASE 02/04/2017 Cervical Esophageal Phase (No Data) Pudding Teaspoon -- Pudding Cup -- Honey Teaspoon -- Honey Cup -- Nectar Teaspoon -- Nectar Cup -- Nectar Straw -- Thin Teaspoon -- Thin Cup -- Thin Straw -- Puree -- Mechanical Soft -- Regular -- Multi-consistency -- Pill -- Cervical Esophageal Comment -- Juan Quam Laurice 07/24/2019, 1:28 PM                   Scheduled Meds: . amLODipine  10 mg Oral Daily  . aspirin EC  325 mg Oral Daily  . budesonide  1 mg Inhalation BID  . clopidogrel  75 mg Oral Daily  . ezetimibe  10 mg Oral Daily  . guaiFENesin  1,200 mg Oral  BID  . heparin  5,000 Units Subcutaneous Q8H  . isosorbide mononitrate  30 mg Oral Daily  . metoprolol succinate  25 mg Oral Daily  . polyethylene glycol  17 g Oral Daily  . polyvinyl alcohol  1 drop Both Eyes QID  . predniSONE  40 mg Oral Q breakfast  . umeclidinium bromide  1 puff Inhalation Daily   Continuous Infusions:   LOS: 6 days    Time spent: 25 mins,More than 50% of that time was spent in counseling and/or coordination of care.      Shelly Coss, MD [facility] P6/24/2021, 7:42 AM

## 2019-07-26 NOTE — Significant Event (Signed)
Rapid Response Event Note  Overview: Respiratory - Second Set of Eyes  Initial Focused Assessment: While I was rounding on 3W - nurse asked me to see the patient for increased congestion. Upon arrival, patient did have rattling lung sounds that I heard from the doorway - coarse rhonchi and wheezing throughout the lungs bilaterally. Patient did endorse she was short of breath but not in acute distress, 96% on RA. Poor cough on command though.   I did NTS x 1 - did not get a lot of secretions up. I started Xopenox Neb x 1 for wheezing/SOB   Interventions: -- NTS x 1 -- not much was retrieved  -- Xopenox Neb x 1  Plan of Care: -- Monitor VS and respiratory status -- Encourage IS/FV -- Encourage OBB to chair  -- Rest per MD  Event Summary:  Start Time 1050 End Time 1115  Ketan Renz R

## 2019-07-26 NOTE — Plan of Care (Signed)
Pt alert and oriented x2. VSS and on Tele-SB. Pt c/o chest pain during prior shift. Pt stated it hurts when she coughs. Cough is weak and congested. New orders were placed. Educated the pt to cough while using a splint.  Problem: Education: Goal: Knowledge of disease or condition will improve Outcome: Progressing Goal: Knowledge of secondary prevention will improve Outcome: Progressing Goal: Knowledge of patient specific risk factors addressed and post discharge goals established will improve Outcome: Progressing Goal: Individualized Educational Video(s) Outcome: Progressing   Problem: Coping: Goal: Will verbalize positive feelings about self Outcome: Progressing Goal: Will identify appropriate support needs Outcome: Progressing   Problem: Education: Goal: Knowledge of General Education information will improve Description: Including pain rating scale, medication(s)/side effects and non-pharmacologic comfort measures Outcome: Progressing   Problem: Health Behavior/Discharge Planning: Goal: Ability to manage health-related needs will improve Outcome: Progressing   Problem: Clinical Measurements: Goal: Ability to maintain clinical measurements within normal limits will improve Outcome: Progressing Goal: Will remain free from infection Outcome: Progressing Goal: Diagnostic test results will improve Outcome: Progressing Goal: Respiratory complications will improve Outcome: Progressing Goal: Cardiovascular complication will be avoided Outcome: Progressing   Problem: Activity: Goal: Risk for activity intolerance will decrease Outcome: Progressing   Problem: Nutrition: Goal: Adequate nutrition will be maintained Outcome: Progressing   Problem: Coping: Goal: Level of anxiety will decrease Outcome: Progressing   Problem: Elimination: Goal: Will not experience complications related to bowel motility Outcome: Progressing Goal: Will not experience complications related to  urinary retention Outcome: Progressing   Problem: Pain Managment: Goal: General experience of comfort will improve Outcome: Progressing   Problem: Safety: Goal: Ability to remain free from injury will improve Outcome: Progressing   Problem: Skin Integrity: Goal: Risk for impaired skin integrity will decrease Outcome: Progressing

## 2019-07-26 NOTE — TOC Progression Note (Signed)
Transition of Care [facility]) - Progression Note    Patient Details  Name: [name] [name] MRN: [id number] Date of Birth: 31-Mar-1927  Transition of Care [facility]) CM/SW [place], [place] Phone Number: 07/26/2019, 10:34 AM  Clinical Narrative:   CSW spoke with granddaughter [name] earlier today, emailed her bed offers. [name] to review and update CSW with choice. CSW to follow.    Expected Discharge Plan: [place] Barriers to Discharge: Continued Medical Work up  Expected Discharge Plan and Services Expected Discharge Plan: [place] arrangements for the past 2 months: Single Family Home                                       Social Determinants of Health (SDOH) Interventions    Readmission Risk Interventions No flowsheet data found.

## 2019-07-26 NOTE — Progress Notes (Signed)
  Speech Language Pathology Treatment: Dysphagia;Cognitive-Linquistic  Patient Details Name: [name] [name] MRN: [id number] DOB: 01-Jun-1927 Today's Date: 07/26/2019 Time: 4076-8088 SLP Time Calculation (min) (ACUTE ONLY): 30 min  Assessment / Plan / Recommendation Clinical Impression  [name] [name] was seen for speech and swallowing therapy. Speech tasks included handout and education of speech intelligibility strategies (SLOPE) for speak slowly, loudly, open mouth, pause between words, every sound counts. Pt demonstrated understanding of strategies. She completed simple yes/no questions with acc'y in 5/7 (two errors were r/t orientation questions and suspect she is disoriented versus language impaired on this task). She answered complex yes/no questions with acc'y in 3/4. She named objects in room with max cues required for acc'y in 25%. Pt appeared to be confused as to the purpose of the task. She stated her name, but was unable to provide DOB. Swallow was minimally targeted, as pt deferred any solid trials. RN reported no difficulty directly r/t PO intake, but increased congestion noted compared to prior days. CXR is WNL, but pt does have very congested breath sounds. Congestion did not change with PO intake of nectar thick liquid. She was seen with cup sips x3 before pushing cup away. Pt had difficulty following directions for volitional cough or for dysphagia exercises (multiple effortful swallows), so these were discontinued.  Pt is expected to continue to benefit from skilled ST at acute level and at next level of care for both swallow and aphasia/cognition.   HPI HPI: [name] [name] is a 86 y.o. female who presents to the ED for evaluation of left-sided weakness and intermittent expressive aphasia.  MRI showed multiple punctate strokes at left BG and corona radiata. Initial bedside swallow eval was completed 6/18. Patient reported to have worsening of right sided weakness-->now  flaccid with significant deficits.      SLP Plan  Continue with current plan of care       Recommendations  Diet recommendations: Dysphagia 3 (mechanical soft);Nectar-thick liquid Medication Administration: Whole meds with puree Supervision: Staff to assist with self feeding Compensations: Small sips/bites;Follow solids with liquid Postural Changes and/or Swallow Maneuvers: Seated upright 90 degrees                SLP Visit Diagnosis: Dysphagia, oropharyngeal phase (R13.12);Aphasia (R47.01) Plan: Continue with current plan of care                     Mandi Mattioli P. Poppi Scantling, M.S., CCC-SLP Speech-Language Pathologist Acute Rehabilitation Services Pager: [place] 07/26/2019, 11:38 AM

## 2019-07-27 DIAGNOSIS — L8990 Pressure ulcer of unspecified site, unspecified stage: Secondary | ICD-10-CM | POA: Insufficient documentation

## 2019-07-27 LAB — BASIC METABOLIC PANEL
Anion gap: 13 (ref 5–15)
BUN: 57 mg/dL — ABNORMAL HIGH (ref 8–23)
CO2: 23 mmol/L (ref 22–32)
Calcium: 9.7 mg/dL (ref 8.9–10.3)
Chloride: 112 mmol/L — ABNORMAL HIGH (ref 98–111)
Creatinine, Ser: 1.25 mg/dL — ABNORMAL HIGH (ref 0.44–1.00)
GFR calc Af Amer: 44 mL/min — ABNORMAL LOW (ref >60)
GFR calc non Af Amer: 38 mL/min — ABNORMAL LOW (ref >60)
Glucose, Bld: 129 mg/dL — ABNORMAL HIGH (ref 70–99)
Potassium: 3.7 mmol/L (ref 3.5–5.1)
Sodium: 148 mmol/L — ABNORMAL HIGH (ref 135–145)

## 2019-07-27 MED ORDER — JUVEN PO PACK
1.0000 | PACK | Freq: Two times a day (BID) | ORAL | Status: DC
  Administered 2019-07-27 – 2019-07-30 (×5): 1 via ORAL
  Filled 2019-07-27 (×7): qty 1

## 2019-07-27 MED ORDER — ADULT MULTIVITAMIN W/MINERALS CH
1.0000 | ORAL_TABLET | Freq: Every day | ORAL | Status: DC
  Administered 2019-07-27 – 2019-07-30 (×4): 1 via ORAL
  Filled 2019-07-27 (×4): qty 1

## 2019-07-27 MED ORDER — SODIUM CHLORIDE 0.45 % IV SOLN: INTRAVENOUS | Status: DC

## 2019-07-27 NOTE — Progress Notes (Signed)
  Speech Language Pathology Treatment: Dysphagia;Cognitive-Linquistic (Aphasia)  Patient Details Name: [name] [name] MRN: [id number] DOB: 1927-02-11 Today's Date: 07/27/2019 Time: 8841-6606 SLP Time Calculation (min) (ACUTE ONLY): 18 min  Assessment / Plan / Recommendation Clinical Impression  Pt was seen for dysphagia treatment. She was alert but verbal output was limited and intelligibility was reduced secondary to dysarthria. Lattie Haw, RN reported that it takes the pt a while to eat and that her p.o. intake has been limited. Pt initially refused anything p.o. but subsequently accepted limited boluses of dysphagia 3 solids and nectar thick liquids. No s/sx of aspiration were noted with any solids or liquids. However, mastication was significantly prolonged with dysphagia 3 solids and pt expressed, "I can't get it done" prior to attempting to spit it out. Speech and language tx was deferred due to pt's c/o fatigue. A dysphagia 2 diet with nectar thick liquids is recommended at this time. SLP will continue to follow pt.    HPI HPI: [name] is a 86 y.o. female who presents to the ED for evaluation of left-sided weakness and intermittent expressive aphasia.  MRI showed multiple punctate strokes at left BG and corona radiata. Initial bedside swallow eval was completed 6/18. Patient reported to have worsening of right sided weakness-->now flaccid with significant deficits.      SLP Plan  Continue with current plan of care       Recommendations  Diet recommendations: Nectar-thick liquid;Dysphagia 2 (fine chop) Liquids provided via: Cup;Straw Medication Administration: Crushed with puree Supervision: Staff to assist with self feeding Compensations: Small sips/bites;Follow solids with liquid Postural Changes and/or Swallow Maneuvers: Seated upright 90 degrees                Oral Care Recommendations: Oral care BID Follow up Recommendations: Skilled Nursing facility SLP  Visit Diagnosis: Dysphagia, oropharyngeal phase (R13.12);Aphasia (R47.01);Dysarthria and anarthria (R47.1) Plan: Continue with current plan of care       Jesus Nevills I. Hardin Negus, [place], [place] Office number [phone] Pager [place] 07/27/2019, 1:07 PM

## 2019-07-27 NOTE — Progress Notes (Addendum)
PROGRESS NOTE    [name] [name]  MPN:[id number] DOB: 1928/01/02 DOA: 07/19/2019 PCP: Burnis Medin, MD   Brief Narrative: Patient is a 86 year old female with history of coronary disease status post stenting to RCA in 2005, peripheral vascular disease, COPD, CKD stage IIIa, hypertension, hyperlipidemia, carotid stenosis who presented to the emergency department for the evaluation of left-sided weakness and intermittent expressive aphasia.  Initial CT head/MRI of the brain did not show any stroke.  Later in the hospital, she developed right-sided weakness.  Repeat MRI showed acute infarct in the left corona radiata  and basal ganglia.  Neurology was consulted and following.  Stroke workup completed.  Neurology recommended dual antiplatelet therapy with aspirin Plavix for 3 months followed by Plavix monotherapy because of severe left MCA stenosis.  PT/OT recommending  skilled nursing facility.  Patient is medically stable for discharge as soon as bed is available.  Assessment & Plan:   Principal Problem:   Acute ischemic left MCA stroke (HCC) Active Problems:   Hyperlipidemia   Essential hypertension, benign   Peripheral vascular disease (HCC)   COPD (chronic obstructive pulmonary disease) (HCC)   Left-sided weakness   Hypokalemia   CAD (coronary artery disease), native coronary artery   Aphasia   Cerebral thrombosis with cerebral infarction   Pressure injury of skin   Acute ischemic stroke: Has left MCA subcortical infarct likely due to high-grade stenosis of left M1.  MRI head initially did not show any acute brain findings.  MRA head showed 6 mm short segment severe stenosis involving the mid distal left M1 segment.  CT head and neck showed compensated severe left MCA M1 stenosis, severe left vertebral V4 segment stenosis, moderate to severe bilateral PCA stenosis.  Also showed bilateral carotid bifurcation stenosis affecting ICA: 60% on the right and 55 to 60% on the left.  Last  MRI showed multiple puncture the stroke at left basal ganglia and corona radiata. The stroke is most likely due to large vessel disease of high-grade left MCA stenosis. Neurology was consulted and was following.  Recommended aspirin 325 mg and Plavix for 3 months followed by Plavix alone.  She needs to follow-up with [facility] neurology as an outpatient. Stroke work-up completed.  Hemoglobin A1c of 5.6.  LDL of 120. Echocardiogram showed ejection fraction of 65%, no cardiac source of emboli. PT/OT recommending CIR versus skilled nursing facility. Speech therapy suspected dysphagia.  Started on dysphagia 3 diet.  COPD exacerbation:  Treated with Solu-Medrol and azithromycin.  Chest x-ray did not show pneumonia or any other acute findings.  Continue mucolytics.  She is not on oxygen at home.  Continue bronchodilators. Discussed with the granddaughter, as per her ,patient has chronic congestion and smoker's cough.  However,her wheezes like auscultation findings are most likely upper airway related.  Steroids changed to oral.She is on room air  Hypertension: Currently on Norvasc, Toprol, Imdur.  She is still mildly hypertensive.  Continue current medications and continue to monitor blood pressure.  Hyperlipidemia: LDL of 120.  She has a statin intolerance.  Continue Zetia.  Coronary artery disease: Status post stenting to the RCA.  Denies any chest pain.  Continue current medications.She has asymptomatic bradycardia.  CKD stage IIIa: Chronic and appears stable.Creatinine midly up today with hypernatremia which could be associated with dehydration.  Continue gentle half-normal saline .  Check BMP tomorrow  Tobacco abuse: Currently smokes.  Smoking cessation counseled.  Patient is willing to quit.  Severe malnutrition: BMI of 17.16  DVT prophylaxis:Heparin Maria Antonia Code Status: Full Family Communication: Discussed with granddaughter on phone on 07/26/19 Status is: Inpatient  Remains  inpatient appropriate because:Unsafe d/c plan   Dispo: The patient is from: Home              Anticipated d/c is to:SNF              Anticipated d/c date is: On the day of bed availability at a skilled nursing facility Medically stable for DC as soon as bed is available        Consultants: Neurology  Procedures: None  Antimicrobials:  Anti-infectives (From admission, onward)   Start     Dose/Rate Route Frequency Ordered Stop   07/23/19 1000  azithromycin (ZITHROMAX) tablet 500 mg        500 mg Oral Daily 07/23/19 0858 07/25/19 1013      Subjective: Patient seen and examined at the bedside this morning.  Hemodynamically stable.  Comfortable.  Congestion seems to have improved today.  She is on room air.  Denies any new complaints.  Objective: Vitals:   07/26/19 2019 07/26/19 2358 07/27/19 0331 07/27/19 0732  BP:  (!) 157/56 (!) 148/66 (!) 170/68  Pulse:  (!) 54 (!) 54 (!) 57  Resp:  17 17 16   Temp:  98.6 F (37 C) 99 F (37.2 C) 97.9 F (36.6 C)  TempSrc:   Oral Oral  SpO2: 95% 97% 96% 95%  Weight:      Height:        Intake/Output Summary (Last 24 hours) at 07/27/2019 1106 Last data filed at 07/26/2019 1700 Gross per 24 hour  Intake --  Output 400 ml  Net -400 ml   Filed Weights   07/19/19 1743  Weight: 45.4 kg    Examination:  General exam: Extremely deconditioned, debilitated elderly female Respiratory system: Bilateral coarse breathing sounds, mild wheezes  cardiovascular system: S1 & S2 heard, RRR. No JVD, murmurs, rubs, gallops or clicks. Gastrointestinal system: Abdomen is nondistended, soft and nontender. No organomegaly or masses felt. Normal bowel sounds heard. Central nervous system: Alert and oriented. No focal neurological deficits. Extremities: No edema, no clubbing ,no cyanosis, distal peripheral pulses palpable. Skin: scattered ecchymoses,skin breakdowns   Data Reviewed: I have personally reviewed following labs and imaging  studies  CBC: No results for input(s): WBC, NEUTROABS, HGB, HCT, MCV, PLT in the last 168 hours. Basic Metabolic Panel: Recent Labs  Lab 07/22/19 0841 07/25/19 1949 07/26/19 0908 07/27/19 0508  NA 143 145 148* 148*  K 3.5 3.4* 3.3* 3.7  CL 106 111 113* 112*  CO2 28 21* 22 23  GLUCOSE 107* 180* 102* 129*  BUN 19 66* 58* 57*  CREATININE 0.97 1.44* 1.21* 1.25*  CALCIUM 9.1 9.5 9.5 9.7  MG  --  2.5*  --   --   PHOS 3.7 3.8  --   --    GFR: Estimated Creatinine Clearance: 21.9 mL/min (A) (by C-G formula based on SCr of 1.25 mg/dL (H)). Liver Function Tests: Recent Labs  Lab 07/25/19 1949  ALBUMIN 2.8*   No results for input(s): LIPASE, AMYLASE in the last 168 hours. No results for input(s): AMMONIA in the last 168 hours. Coagulation Profile: No results for input(s): INR, PROTIME in the last 168 hours. Cardiac Enzymes: No results for input(s): CKTOTAL, CKMB, CKMBINDEX, TROPONINI in the last 168 hours. BNP (last 3 results) No results for input(s): PROBNP in the last 8760 hours. HbA1C: No results for input(s): HGBA1C in the  last 72 hours. CBG: No results for input(s): GLUCAP in the last 168 hours. Lipid Profile: No results for input(s): CHOL, HDL, LDLCALC, TRIG, CHOLHDL, LDLDIRECT in the last 72 hours. Thyroid Function Tests: No results for input(s): TSH, T4TOTAL, FREET4, T3FREE, THYROIDAB in the last 72 hours. Anemia Panel: No results for input(s): VITAMINB12, FOLATE, FERRITIN, TIBC, IRON, RETICCTPCT in the last 72 hours. Sepsis Labs: No results for input(s): PROCALCITON, LATICACIDVEN in the last 168 hours.  Recent Results (from the past 240 hour(s))  SARS Coronavirus 2 by RT PCR (hospital order, performed in [facility] hospital lab) Nasopharyngeal Nasopharyngeal Swab     Status: None   Collection Time: 07/19/19 10:53 PM   Specimen: Nasopharyngeal Swab  Result Value Ref Range Status   SARS Coronavirus 2 NEGATIVE NEGATIVE Final    Comment: (NOTE) SARS-CoV-2 target  nucleic acids are NOT DETECTED.  The SARS-CoV-2 RNA is generally detectable in upper and lower respiratory specimens during the acute phase of infection. The lowest concentration of SARS-CoV-2 viral copies this assay can detect is 250 copies / mL. A negative result does not preclude SARS-CoV-2 infection and should not be used as the sole basis for treatment or other patient management decisions.  A negative result may occur with improper specimen collection / handling, submission of specimen other than nasopharyngeal swab, presence of viral mutation(s) within the areas targeted by this assay, and inadequate number of viral copies (<250 copies / mL). A negative result must be combined with clinical observations, patient history, and epidemiological information.  Fact Sheet for Patients:   StrictlyIdeas.no  Fact Sheet for Healthcare Providers: BankingDealers.co.za  This test is not yet approved or  cleared by the [place] FDA and has been authorized for detection and/or diagnosis of SARS-CoV-2 by FDA under an Emergency Use Authorization (EUA).  This EUA will remain in effect (meaning this test can be used) for the duration of the COVID-19 declaration under Section 564(b)(1) of the Act, 21 U.S.C. section 360bbb-3(b)(1), unless the authorization is terminated or revoked sooner.  Performed at [facility], [place] [address], [place], [place] [address]          Radiology Studies: DG Chest 1 View  Result Date: 07/25/2019 CLINICAL DATA:  86 year old female with shortness of breath. EXAM: CHEST  1 VIEW COMPARISON:  Chest radiograph dated 07/24/2019. FINDINGS: No focal consolidation, pleural effusion or pneumothorax. The cardiac silhouette is within limits. Atherosclerotic calcification of the aorta. No acute osseous pathology. Degenerative changes of the spine. IMPRESSION: No active cardiopulmonary disease. Electronically Signed    By: Anner Crete M.D.   On: 07/25/2019 21:35        Scheduled Meds: . amLODipine  10 mg Oral Daily  . aspirin EC  325 mg Oral Daily  . budesonide  1 mg Inhalation BID  . clopidogrel  75 mg Oral Daily  . ezetimibe  10 mg Oral Daily  . guaiFENesin  1,200 mg Oral BID  . heparin  5,000 Units Subcutaneous Q8H  . isosorbide mononitrate  30 mg Oral Daily  . metoprolol succinate  25 mg Oral Daily  . polyethylene glycol  17 g Oral Daily  . polyvinyl alcohol  1 drop Both Eyes QID  . predniSONE  40 mg Oral Q breakfast  . umeclidinium bromide  1 puff Inhalation Daily   Continuous Infusions: . sodium chloride       LOS: 7 days    Time spent: 25 mins,More than 50% of that time was spent in  counseling and/or coordination of care.      Shelly Coss, MD [facility] P6/25/2021, 11:06 AM

## 2019-07-27 NOTE — Progress Notes (Signed)
Initial Nutrition Assessment  DOCUMENTATION CODES:   Severe malnutrition in context of chronic illness  INTERVENTION:  -Vital Cuisine shake po TID, each supplements provides 500 kcal and 22 grams of protein  -Magic cup TID with meals, each supplement provides 290 kcal and 9 grams of protein  -MVI daily  -1 packet Juven BID, each packet provides 95 calories, 2.5 grams of protein (collagen), and 9.8 grams of carbohydrate (3 grams sugar); also contains 7 grams of L-arginine and L-glutamine, 300 mg vitamin C, 15 mg vitamin E, 1.2 mcg vitamin B-12, 9.5 mg zinc, 200 mg calcium, and 1.5 g  Calcium Beta-hydroxy-Beta-methylbutyrate to support wound healing   -No bowel movement documented since 6/17, consider initiation of bowel regimen.    NUTRITION DIAGNOSIS:   Severe Malnutrition related to chronic illness as evidenced by severe fat depletion, severe muscle depletion.   GOAL:   Patient will meet greater than or equal to 90% of their needs   MONITOR:   PO intake, Supplement acceptance, Skin, Weight trends, Labs, I & O's  REASON FOR ASSESSMENT:   Malnutrition Screening Tool    ASSESSMENT:   Pt admitted with acute ischemic L MCA stroke. PMH significant for CAD s/p stent to RCA, PVD, COPD, CKD stage 3, HTN, HLD, carotid stenosis  Pt sleeping at time of RD visit and did not wake to RD voice or touch.   Per MD, pt is stable for discharge to SNF once bed is available.   No bowel movement documented since 6/17, consider initiation of bowel regimen.   No significant wt changes over last year per wt readings.  PO Intake: 25-50% x 2 recorded meals  Labs: Na 148 (H) Medications: Deltasone, Miralax  NUTRITION - FOCUSED PHYSICAL EXAM:    Most Recent Value  Orbital Region Severe depletion  Upper Arm Region Severe depletion  Thoracic and Lumbar Region Severe depletion  Buccal Region Severe depletion  Temple Region Severe depletion  Clavicle Bone Region Severe depletion   Clavicle and Acromion Bone Region Severe depletion  Scapular Bone Region Severe depletion  Dorsal Hand Severe depletion  Patellar Region Severe depletion  Anterior Thigh Region Severe depletion  Posterior Calf Region Severe depletion  Edema (RD Assessment) None  Hair Reviewed  Eyes Reviewed  Mouth Reviewed  Skin Reviewed  Nails Reviewed       Diet Order:   Diet Order            DIET DYS 3 Room service appropriate? Yes; Fluid consistency: Nectar Thick  Diet effective now                 EDUCATION NEEDS:   No education needs have been identified at this time  Skin:  Skin Assessment: Skin Integrity Issues: Skin Integrity Issues:: Stage I Stage I: sacrum  Last BM:  6/17  Height:   Ht Readings from Last 1 Encounters:  07/19/19 5\' 4"  (1.626 m)    Weight:   Wt Readings from Last 3 Encounters:  07/19/19 45.4 kg  07/17/19 48 kg  07/31/18 47.6 kg    BMI:  Body mass index is 17.16 kg/m.  Estimated Nutritional Needs:   Kcal:  1300-1500  Protein:  70-85 grams  Fluid:  >1.3L/d    Larkin Ina, MS, RD, LDN RD pager number and weekend/on-call pager number located in [place].

## 2019-07-27 NOTE — TOC Progression Note (Signed)
Transition of Care [facility]) - Progression Note    Patient Details  Name: [name] [name] MRN: [id number] Date of Birth: February 14, 1927  Transition of Care [facility] CM/SW [place], [place] Phone Number: 07/27/2019, 5:16 PM  Clinical Narrative:   CSW contacted granddaughter to discuss SNF, left a voicemail. She called back and chose [facility]. [facility] to start [employer] authorization. CSW to follow.    Expected Discharge Plan: [place] Barriers to Discharge: Continued Medical Work up  Expected Discharge Plan and Services Expected Discharge Plan: [place] arrangements for the past 2 months: Single Family Home                                       Social Determinants of Health (SDOH) Interventions    Readmission Risk Interventions No flowsheet data found.

## 2019-07-27 NOTE — Progress Notes (Signed)
Physical Therapy Treatment Patient Details Name: [name] [name] MRN: [id number] DOB: July 05, 1927 Today's Date: 07/27/2019    History of Present Illness Pt is an 86 y/o female who presents with weakness and aphasia. 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    PT Comments    Patient seen for mobility progression. Pt able to transfer OOB to recliner with max A +2. Continue to progress as tolerated with anticipated d/c to SNF for further skilled PT services.    Follow Up Recommendations  SNF;Supervision/Assistance - 24 hour     Equipment Recommendations  Other (comment) (TBD by next venue of care)    Recommendations for Other Services       Precautions / Restrictions Precautions Precautions: Fall Precaution Comments: Questionable R shoulder subluxation Restrictions Weight Bearing Restrictions: No    Mobility  Bed Mobility Overal bed mobility: Needs Assistance Bed Mobility: Supine to Sit     Supine to sit: Total assist;+2 for physical assistance     General bed mobility comments: pt required total A +2 to exit bed to pts L side. utilized bed pad to scoot pts hips to EOB and elevate trunk into sitting.  Transfers Overall transfer level: Needs assistance Equipment used: 2 person hand held assist Transfers: Set designer Transfers;Sit to/from Stand Sit to Stand: Max assist;+2 physical assistance Stand pivot transfers: Max assist;+2 physical assistance       General transfer comment: pt able to sit<>stand from EOB with MAX A +2; utilized bed pad to facilitate full hip extension. mutlimodal cues to initiate pivotal steps to recliner to pts L side; total A to mobilize R LE; cues for sequencing   Ambulation/Gait                 Stairs             Wheelchair Mobility    Modified Rankin (Stroke Patients Only) Modified Rankin (Stroke Patients Only) Pre-Morbid  Rankin Score: No symptoms Modified Rankin: Severe disability     Balance Overall balance assessment: Needs assistance Sitting-balance support: Feet supported;Single extremity supported Sitting balance-Leahy Scale: Poor Sitting balance - Comments: pt required at least MOD A for sitting balance EOB d/t R lateral lean Postural control: Right lateral lean Standing balance support: Bilateral upper extremity supported Standing balance-Leahy Scale: Poor Standing balance comment: reliant on BUE support                            Cognition Arousal/Alertness: Awake/alert Behavior During Therapy: Flat affect Overall Cognitive Status: Impaired/Different from baseline Area of Impairment: Attention;Awareness;Following commands;Problem solving                   Current Attention Level: Focused   Following Commands: Follows one step commands inconsistently;Follows one step commands with increased time Safety/Judgement: Decreased awareness of safety Awareness: Intellectual Problem Solving: Slow processing;Decreased initiation;Difficulty sequencing;Requires verbal cues;Requires tactile cues General Comments: pt flat overall during session, able to state pain but communicating minimally with therapists. pt following commands inconsistently during session      Exercises Other Exercises Other Exercises: significant weakness on RUE; trace noted  on RUE for elbow flexion, pt nods yes when asked about decreased sensation on RUE. PROM to RUE via elbow flexion/ extension and wrist flexion/ extension    General Comments General comments (skin integrity, edema, etc.): pt pulling < 250 on IS; pts granddaughter present at  end of session. Pt noted to have residual food in mouth with pt unable to follow commands enough to retrieve food. able to remove some but alerted RN to suction mouth      Pertinent Vitals/Pain Pain Assessment: Faces Faces Pain Scale: Hurts little more Pain Location:  generalized with mobility  Pain Descriptors / Indicators: Grimacing Pain Intervention(s): Limited activity within patient's tolerance;Monitored during session;Repositioned    Home Living                      Prior Function            PT Goals (current goals can now be found in the care plan section) Acute Rehab PT Goals Patient Stated Goal: None specifically stated Progress towards PT goals: Progressing toward goals    Frequency    Min 3X/week      PT Plan Current plan remains appropriate    Co-evaluation PT/OT/SLP Co-Evaluation/Treatment: Yes Reason for Co-Treatment: Complexity of the patient's impairments (multi-system involvement);Necessary to address cognition/behavior during functional activity;For patient/therapist safety;To address functional/ADL transfers PT goals addressed during session: Mobility/safety with mobility        AM-PAC PT "6 Clicks" Mobility   Outcome Measure  Help needed turning from your back to your side while in a flat bed without using bedrails?: A Lot Help needed moving from lying on your back to sitting on the side of a flat bed without using bedrails?: A Lot Help needed moving to and from a bed to a chair (including a wheelchair)?: A Lot Help needed standing up from a chair using your arms (e.g., wheelchair or bedside chair)?: A Lot Help needed to walk in hospital room?: Total Help needed climbing 3-5 steps with a railing? : Total 6 Click Score: 10    End of Session   Activity Tolerance: Patient tolerated treatment well Patient left: in chair;with call bell/phone within reach;with chair alarm set;with family/visitor present Nurse Communication: Mobility status;Need for lift equipment (Maxi-move) PT Visit Diagnosis: Unsteadiness on feet (R26.81);Other symptoms and signs involving the nervous system (R29.898)     Time: 3546-5681 PT Time Calculation (min) (ACUTE ONLY): 37 min  Charges:  $Therapeutic Activity: 8-22 mins                      Earney Navy, PTA Acute Rehabilitation Services Pager: [phone] Office: [phone]     Darliss Cheney 07/27/2019, 5:02 PM

## 2019-07-27 NOTE — Progress Notes (Signed)
Occupational Therapy Treatment Patient Details Name: [name] [name] MRN: [id number] DOB: [birth year] Today's Date: 07/27/2019    History of present illness Pt is an 86 y/o female who presents with weakness and aphasia. 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   OT comments  Pt seen in conjunction with PT to maximize participation and activity tolerance. Pt able to progress OOB this session with MAX A +2 via stand pivot to recliner. Pt communicated minimally with therapists but able to state name and nod appropriately. Pt continues to present with decreased activity tolerance, R sided weakness, impaired balance, and decreased ability to care for self impacting pts ability to complete BADLS. Continue to recommend SNF for continued rehab, will follow.   Follow Up Recommendations  SNF;Supervision/Assistance - 24 hour;Other (comment) (CIR if patient becomes inpatient)    Equipment Recommendations  Other (comment) (defer to next venue of care)    Recommendations for Other Services      Precautions / Restrictions Precautions Precautions: Fall Restrictions Weight Bearing Restrictions: No       Mobility Bed Mobility Overal bed mobility: Needs Assistance Bed Mobility: Supine to Sit     Supine to sit: Total assist;+2 for physical assistance     General bed mobility comments: pt required total A +2 to exit bed to pts L side. utilized bed pad to scoot pts hips to EOB and elevate trunk into sitting.  Transfers Overall transfer level: Needs assistance Equipment used: 2 person hand held assist Transfers: Set designer Transfers;Sit to/from Stand Sit to Stand: Max assist;+2 physical assistance Stand pivot transfers: Max assist;+2 physical assistance       General transfer comment: pt able to sit<>stand from EOB with MAX A +2; utilized bed pad to facilitate full hip extension. mutlimodal  cues to initiate pivotal steps to recliner to pts L side    Balance Overall balance assessment: Needs assistance Sitting-balance support: Feet supported;Single extremity supported Sitting balance-Leahy Scale: Poor Sitting balance - Comments: pt required at least MOD A for sitting balance EOB d/t R lateral lean Postural control: Right lateral lean Standing balance support: Bilateral upper extremity supported Standing balance-Leahy Scale: Poor Standing balance comment: reliant on BUE support                           ADL either performed or assessed with clinical judgement   ADL Overall ADL's : Needs assistance/impaired     Grooming: Wash/dry face;Sitting;Total assistance Grooming Details (indicate cue type and reason): total A to wash face from recliner                 Toilet Transfer: Maximal assistance;+2 for physical assistance;Stand-pivot Toilet Transfer Details (indicate cue type and reason): simulated to recliner; MAX A +2 to stand pivot to recliner         Functional mobility during ADLs: Maximal assistance;+2 for physical assistance (stand pivot only) General ADL Comments: pt able to progress OOB to chair this session via stand pivot to recliner with MAX A +2. Pt communicating less with therapist this session in comparison to previous session as pt reports pain when talking     Vision Baseline Vision/History: Wears glasses Wears Glasses: At all times Patient Visual Report: No change from baseline Vision Assessment?: Vision impaired- to be further tested in functional context Additional Comments: pt appears to have R hemianopsia, unable to respond to threat on R  side and unable to track past midline   Perception     Praxis      Cognition Arousal/Alertness: Awake/alert Behavior During Therapy: Flat affect Overall Cognitive Status: Impaired/Different from baseline Area of Impairment: Attention;Awareness;Following commands;Problem solving                    Current Attention Level: Focused   Following Commands: Follows one step commands inconsistently;Follows one step commands with increased time   Awareness: Intellectual Problem Solving: Slow processing;Decreased initiation;Difficulty sequencing;Requires verbal cues;Requires tactile cues General Comments: pt flat overall during session, able to state pain but communicating minimally with therapists. pt following commands inconsistently during session        Exercises Other Exercises Other Exercises: significant weakness on RUE; trace noted  on RUE for elbow flexion, pt nods yes when asked about decreased sensation on RUE. PROM to RUE via elbow flexion/ extension and wrist flexion/ extension   Shoulder Instructions       General Comments pt pulling < 250 on IS; pts granddaughter present at end of session. Pt noted to have residual food in mouth with pt unable to follow commands enough to retrieve food. able to remove some but alerted RN to suction mouth    Pertinent Vitals/ Pain       Pain Assessment: Faces Faces Pain Scale: Hurts little more Pain Location: generalized with mobility Pain Descriptors / Indicators: Grimacing Pain Intervention(s): Monitored during session;Limited activity within patient's tolerance;Repositioned  Home Living                                          Prior Functioning/Environment              Frequency  Min 2X/week        Progress Toward Goals  OT Goals(current goals can now be found in the care plan section)  Progress towards OT goals: Progressing toward goals  Acute Rehab OT Goals Patient Stated Goal: None specifically stated OT Goal Formulation: With patient Time For Goal Achievement: 08/03/19 Potential to Achieve Goals: Good  Plan Discharge plan remains appropriate;Frequency remains appropriate    Co-evaluation                 AM-PAC OT "6 Clicks" Daily Activity     Outcome Measure    Help from another person eating meals?: Total Help from another person taking care of personal grooming?: A Lot Help from another person toileting, which includes using toliet, bedpan, or urinal?: A Lot Help from another person bathing (including washing, rinsing, drying)?: A Lot Help from another person to put on and taking off regular upper body clothing?: A Lot Help from another person to put on and taking off regular lower body clothing?: A Lot 6 Click Score: 11    End of Session    OT Visit Diagnosis: Unsteadiness on feet (R26.81);Other abnormalities of gait and mobility (R26.89);Muscle weakness (generalized) (M62.81);Other symptoms and signs involving the nervous system (R29.898);Other symptoms and signs involving cognitive function   Activity Tolerance Patient tolerated treatment well;Patient limited by fatigue   Patient Left in chair;with call bell/phone within reach;with chair alarm set;Other (comment) (lift pad underneath)   Nurse Communication Mobility status;Need for lift equipment;Other (comment) (maximove)        Time: 2563-8937 OT Time Calculation (min): 36 min  Charges: OT General Charges $OT Visit: 1 Visit OT Treatments $Therapeutic Activity:  8-22 mins  Lanier Clam., COTA/L Acute Rehabilitation Services [phone] [place] 07/27/2019, 4:44 PM

## 2019-07-28 DIAGNOSIS — J449 Chronic obstructive pulmonary disease, unspecified: Secondary | ICD-10-CM

## 2019-07-28 DIAGNOSIS — E7800 Pure hypercholesterolemia, unspecified: Secondary | ICD-10-CM

## 2019-07-28 DIAGNOSIS — E876 Hypokalemia: Secondary | ICD-10-CM

## 2019-07-28 DIAGNOSIS — I2510 Atherosclerotic heart disease of native coronary artery without angina pectoris: Secondary | ICD-10-CM

## 2019-07-28 LAB — BASIC METABOLIC PANEL
Anion gap: 13 (ref 5–15)
BUN: 61 mg/dL — ABNORMAL HIGH (ref 8–23)
CO2: 21 mmol/L — ABNORMAL LOW (ref 22–32)
Calcium: 9.5 mg/dL (ref 8.9–10.3)
Chloride: 115 mmol/L — ABNORMAL HIGH (ref 98–111)
Creatinine, Ser: 1.29 mg/dL — ABNORMAL HIGH (ref 0.44–1.00)
GFR calc Af Amer: 43 mL/min — ABNORMAL LOW (ref >60)
GFR calc non Af Amer: 37 mL/min — ABNORMAL LOW (ref >60)
Glucose, Bld: 106 mg/dL — ABNORMAL HIGH (ref 70–99)
Potassium: 3.9 mmol/L (ref 3.5–5.1)
Sodium: 149 mmol/L — ABNORMAL HIGH (ref 135–145)

## 2019-07-28 NOTE — Progress Notes (Signed)
PROGRESS NOTE    [name] [name]  QBV:[id number] DOB: [birth year] DOA: 07/19/2019 PCP: Burnis Medin, MD   Brief Narrative:  Patient is a 86 year old female with history of coronary disease status post stenting to RCA in 2005, peripheral vascular disease, COPD, CKD stage IIIa, hypertension, hyperlipidemia, carotid stenosis who presented to the emergency department for the evaluation of left-sided weakness and intermittent expressive aphasia.  Initial CT head/MRI of the brain did not show any stroke.  Later in the hospital, she developed right-sided weakness.  Repeat MRI showed acute infarct in the left corona radiata  and basal ganglia.  Neurology was consulted and following.  Stroke workup completed.  Neurology recommended dual antiplatelet therapy with aspirin Plavix for 3 months followed by Plavix monotherapy because of severe left MCA stenosis.  PT/OT recommending  skilled nursing facility.  Patient is medically stable for discharge as soon as bed is available.   Assessment & Plan:   Principal Problem:   Acute ischemic left MCA stroke (HCC) Active Problems:   Hyperlipidemia   Essential hypertension, benign   Peripheral vascular disease (HCC)   COPD (chronic obstructive pulmonary disease) (HCC)   Left-sided weakness   Hypokalemia   CAD (coronary artery disease), native coronary artery   Aphasia   Cerebral thrombosis with cerebral infarction   Pressure injury of skin   Acute ischemic stroke: No changes, has left MCA subcortical infarct likely due to high-grade stenosis of left M1.  MRI head initially did not show any acute brain findings.  MRA head showed 6 mm short segment severe stenosis involving the mid distal left M1 segment.  CT head and neck showed compensated severe left MCA M1 stenosis, severe left vertebral V4 segment stenosis, moderate to severe bilateral PCA stenosis.  Also showed bilateral carotid bifurcation stenosis affecting ICA: 60% on the right and 55 to 60% on  the left.  Last MRI showed multiple puncture the stroke at left basal ganglia and corona radiata. The stroke is most likely due to large vessel disease of high-grade left MCA stenosis. Neurology was consulted and was following.  Recommended aspirin 325 mg and Plavix for 3 months followed by Plavix alone.  She needs to follow-up with [facility] neurology as an outpatient. Stroke work-up completed.  Hemoglobin A1c of 5.6.  LDL of 120. Echocardiogram showed ejection fraction of 65%, no cardiac source of emboli. PT/OT recommending CIR versus skilled nursing facility. Speech therapy suspected dysphagia.  Started on dysphagia 3 diet.  COPD exacerbation:  Treated with Solu-Medrol and azithromycin.  Chest x-ray did not show pneumonia or any other acute findings.  Continue mucolytics.  She is not on oxygen at home.  Continue bronchodilators. Discussed with the granddaughter, as per her ,patient has chronic congestion and smoker's cough.  However,her wheezes like auscultation findings are most likely upper airway related.  Steroids changed to oral.She is on room air and doing well  Hypertension: Currently on Norvasc, Toprol, Imdur.  She is still mildly hypertensive.  Continue current medications and continue to monitor blood pressure.  Hyperlipidemia: LDL of 120.  She has a statin intolerance.  Continue Zetia.  Coronary artery disease: Status post stenting to the RCA.  Denies any chest pain.  Continue current medications.She has asymptomatic bradycardia.  CKD stage IIIa: Chronic and appears stable.Creatinine midly up today with hypernatremia which could be associated with dehydration.  Continue gentle half-normal saline .  Check BMP tomorrow  Tobacco abuse: Currently smokes.  Smoking cessation counseled.  Patient is willing to quit.  Severe malnutrition: BMI of 17.16    DVT prophylaxis: Heparin SQ  Code Status: Full    Code Status Orders  (From admission, onward)         Start     Ordered    07/19/19 2224  Full code  Continuous        07/19/19 2230        Code Status History    Date Active Date Inactive Code Status Order ID Comments User Context   02/01/2017 1116 02/05/2017 1811 DNR [id number]  Desiree Hane, MD Inpatient   01/31/2017 2006 02/01/2017 1116 Full Code [id number]  Phillips Grout, MD ED   06/13/2016 1853 06/18/2016 1603 DNR [id number]  Elwin Mocha, MD Inpatient   06/13/2016 1604 06/13/2016 1853 Full Code [id number]  Elwin Mocha, MD ED   04/01/2016 2137 04/05/2016 2122 Full Code [id number]  Norval Morton, MD ED   11/12/2015 1720 11/18/2015 1826 Full Code [id number]  Shon Millet, DO Inpatient   Advance Care Planning Activity    Advance Directive Documentation     Most Recent Value  Type of Advance Directive Living will  Pre-existing out of facility DNR order (yellow form or pink MOST form) --  "MOST" Form in Place? --     DVT prophylaxis:Heparin [place] Code Status: Full Family Communication: Discussed with granddaughter on phone on 07/26/19 Status is: Inpatient  Remains inpatient appropriate because:Unsafe d/c plan   Dispo: The patient is from: Home  Anticipated d/c is to:SNF  Anticipated d/c date is: On the day of bed availability at a skilled nursing facility Medically stable for DC as soon as bed is available   Consultants: Neurology  Procedures:  EEG  Result Date: 07/20/2019 Lora Havens, MD     07/20/2019  3:39 PM Patient Name: [name] [name] MRN: [id number] Epilepsy Attending: Lora Havens Referring Physician/Provider: Dr. Kerney Elbe Date: 07/20/2019 Duration: 24.12 mins Patient history: 86 year old female presenting with transient spells of expressive aphasia. EEG evaluate for seizures. Level of alertness: Awake AEDs during EEG study: None Technical aspects: This EEG study was done with scalp electrodes positioned according to the 10-20 International system of electrode placement.  Electrical activity was acquired at a sampling rate of 500Hz  and reviewed with a high frequency filter of 70Hz  and a low frequency filter of 1Hz . EEG data were recorded continuously and digitally stored. Description: The posterior dominant rhythm consists of 8 Hz activity of moderate voltage (25-35 uV) seen predominantly in posterior head regions, symmetric and reactive to eye opening and eye closing. EEG showed intermittent 2-3Hz  delta slowing in left temporal region.  Hyperventilation and photic stimulation were not performed.   ABNORMALITY -Intermittent slow, left temporal region IMPRESSION: This study is suggestive of non specific cortical dysfunction in left temporal region. No seizures or epileptiform discharges were seen throughout the recording. Lora Havens   CT Code Stroke CTA Head W/WO contrast  Result Date: 07/20/2019 CLINICAL DATA:  86 year old female with neurologic deficit. Unrevealing brain MRI yesterday, but MRA with severe left M1 stenosis. EXAM: CT ANGIOGRAPHY HEAD AND NECK CT PERFUSION BRAIN TECHNIQUE: Multidetector CT imaging of the head and neck was performed using the standard protocol during bolus administration of intravenous contrast. Multiplanar CT image reconstructions and MIPs were obtained to evaluate the vascular anatomy. Carotid stenosis measurements (when applicable) are obtained utilizing NASCET criteria, using the distal internal carotid diameter as the denominator. Multiphase CT imaging of the brain was performed following IV bolus contrast injection.  Subsequent parametric perfusion maps were calculated using RAPID software. CONTRAST:  61mL OMNIPAQUE IOHEXOL 350 MG/ML SOLN COMPARISON:  Brain MRI and intracranial MRA 07/19/2019. FINDINGS: CT Brain Perfusion Findings: ASPECTS: 10 CBF (<30%) Volume: None Perfusion (Tmax>6.0s) volume: None, and no significant T-max asymmetry from the left to right hemisphere. Mismatch Volume: Not applicable Infarction Location:Not applicable  CT HEAD Brain: Calcified atherosclerosis at the skull base. Stable cortical encephalomalacia in the left occipital pole. Patchy bilateral white matter hypodensity is stable. Dystrophic basal ganglia calcifications again noted. No midline shift, ventriculomegaly, mass effect, evidence of mass lesion, intracranial hemorrhage or evidence of cortically based acute infarction. Calvarium and skull base: Mild chronic right orbital floor fracture. No acute osseous abnormality identified. Paranasal sinuses: Sinuses and mastoids are largely clear today. Orbits: No acute orbit or scalp soft tissue finding. CTA NECK Skeleton: Advanced degenerative changes throughout the cervical spine. No acute osseous abnormality identified. Upper chest: Centrilobular emphysema. Mild apical lung scarring. No superior mediastinal lymphadenopathy. Sequelae of right axillary node dissection. Other neck: No acute findings. Aortic arch: Fairly bulky soft and calcified plaque throughout the aortic arch. Three vessel arch configuration. Right carotid system: Brachiocephalic artery and right CCA origin plaque without stenosis. Intermittent plaque in the right CCA without stenosis proximal to the bifurcation. Bulky calcified plaque at the carotid bifurcation and continuing into the proximal right ICA resulting in up to 60 % stenosis with respect to the distal vessel (series 7, image 103). Distal to the bulb no other right ICA plaque to the skull base. Left carotid system: Left CCA origin and other plaque without stenosis proximal to the bifurcation. Bulky calcified plaque at the left ICA origin and bulb resulting in 55-60 % stenosis with respect to the distal vessel (series 10, image 101). Mildly tortuous left ICA. Vertebral arteries: Proximal right subclavian plaque without stenosis. The right vertebral artery origin appear spared. Mild right V2 segment plaque, no right vertebral stenosis to the skull base. Proximal left subclavian artery soft and  calcified plaque is extensive although less than 50 % stenosis with respect to the distal vessel results. Mild stenosis at the left vertebral artery origin. Additional mild soft plaque in the left V1 segment. Patent and slightly non dominant left vertebral artery to the skull base without significant stenosis. CTA HEAD Posterior circulation: Bulky and circumferential calcified plaque of the left V4 segment with high-grade stenosis on series 9, image 145. This is concordant with the MRA yesterday. Mild right V4 calcified plaque without significant stenosis. The left vertebral and left PICA remain patent despite the high-grade stenosis. There is moderate stenosis again at the left vertebrobasilar junction. Patent basilar artery with mild irregularity and no significant stenosis. Patent SCA and PCA origins. Posterior communicating arteries are diminutive or absent. Severe left PCA stenosis from the P2 segment on word. Moderate to severe right P2 stenosis with better distal right PCA enhancement. This is concordant with the MRA. Anterior circulation: Both ICA siphons are patent. Calcified plaque on the left with only mild supraclinoid segment stenosis. Right side calcified plaque and tortuosity with mild supraclinoid stenosis. Patent bilateral carotid termini. Patent MCA and ACA origins. Anterior communicating artery, bilateral ACA branches, right MCA M1, and right MCA bifurcation are patent without stenosis. Mild to moderate right M2 and M3 branch irregularities and stenosis appear similar to the MRA. Left MCA M1 demonstrates a relatively long 6 mm segment of moderate to severe stenosis best seen on series 12, image 20. This leads up to the left MCA trifurcation  which remains patent. Left MCA branches demonstrate fairly symmetric enhancement to those on the right, and only mild additional irregularity. Venous sinuses: Early contrast timing, not evaluated. Anatomic variants: Mildly dominant right vertebral artery.  Review of the MIP images confirms the above findings IMPRESSION: 1. Compensated Severe Left MCA M1 stenosis. CTP is negative for infarct core or penumbra. And CT appearance of the brain is stable. 2. Other intracranial CTA findings concordant to the MRA yesterday including: - Severe Left Vertebral V4 segment stenosis. - Moderate to severe bilateral PCA stenosis worse on the left. 3. Extracranial calcified plaque resulting in bilateral carotid bifurcation stenosis affecting the ICAs: 60% on the Right and 55-60% on the Left . 4. No significant cervical vertebral artery stenosis. 5. Aortic Atherosclerosis (ICD10-I70.0) and Emphysema (ICD10-J43.9). Electronically Signed   By: Genevie Ann M.D.   On: 07/20/2019 04:20   DG Chest 1 View  Result Date: 07/25/2019 CLINICAL DATA:  86 year old female with shortness of breath. EXAM: CHEST  1 VIEW COMPARISON:  Chest radiograph dated 07/24/2019. FINDINGS: No focal consolidation, pleural effusion or pneumothorax. The cardiac silhouette is within limits. Atherosclerotic calcification of the aorta. No acute osseous pathology. Degenerative changes of the spine. IMPRESSION: No active cardiopulmonary disease. Electronically Signed   By: Anner Crete M.D.   On: 07/25/2019 21:35   DG Chest 2 View  Result Date: 07/17/2019 CLINICAL DATA:  Chest tightness since last evening, lower extremity numbness EXAM: CHEST - 2 VIEW COMPARISON:  11/23/2018 FINDINGS: Frontal and lateral views of the chest demonstrate a stable cardiac silhouette. Continued atherosclerosis of the thoracic aorta. Chronic elevation of the left hemidiaphragm. Background emphysema without airspace disease, effusion, or pneumothorax. No acute bony abnormalities. IMPRESSION: 1. Stable exam, no acute process. Electronically Signed   By: Randa Ngo M.D.   On: 07/17/2019 17:21   CT Code Stroke CTA Neck W/WO contrast  Result Date: 07/20/2019 CLINICAL DATA:  86 year old female with neurologic deficit. Unrevealing brain  MRI yesterday, but MRA with severe left M1 stenosis. EXAM: CT ANGIOGRAPHY HEAD AND NECK CT PERFUSION BRAIN TECHNIQUE: Multidetector CT imaging of the head and neck was performed using the standard protocol during bolus administration of intravenous contrast. Multiplanar CT image reconstructions and MIPs were obtained to evaluate the vascular anatomy. Carotid stenosis measurements (when applicable) are obtained utilizing NASCET criteria, using the distal internal carotid diameter as the denominator. Multiphase CT imaging of the brain was performed following IV bolus contrast injection. Subsequent parametric perfusion maps were calculated using RAPID software. CONTRAST:  72mL OMNIPAQUE IOHEXOL 350 MG/ML SOLN COMPARISON:  Brain MRI and intracranial MRA 07/19/2019. FINDINGS: CT Brain Perfusion Findings: ASPECTS: 10 CBF (<30%) Volume: None Perfusion (Tmax>6.0s) volume: None, and no significant T-max asymmetry from the left to right hemisphere. Mismatch Volume: Not applicable Infarction Location:Not applicable CT HEAD Brain: Calcified atherosclerosis at the skull base. Stable cortical encephalomalacia in the left occipital pole. Patchy bilateral white matter hypodensity is stable. Dystrophic basal ganglia calcifications again noted. No midline shift, ventriculomegaly, mass effect, evidence of mass lesion, intracranial hemorrhage or evidence of cortically based acute infarction. Calvarium and skull base: Mild chronic right orbital floor fracture. No acute osseous abnormality identified. Paranasal sinuses: Sinuses and mastoids are largely clear today. Orbits: No acute orbit or scalp soft tissue finding. CTA NECK Skeleton: Advanced degenerative changes throughout the cervical spine. No acute osseous abnormality identified. Upper chest: Centrilobular emphysema. Mild apical lung scarring. No superior mediastinal lymphadenopathy. Sequelae of right axillary node dissection. Other neck: No acute findings. Aortic arch:  Fairly  bulky soft and calcified plaque throughout the aortic arch. Three vessel arch configuration. Right carotid system: Brachiocephalic artery and right CCA origin plaque without stenosis. Intermittent plaque in the right CCA without stenosis proximal to the bifurcation. Bulky calcified plaque at the carotid bifurcation and continuing into the proximal right ICA resulting in up to 60 % stenosis with respect to the distal vessel (series 7, image 103). Distal to the bulb no other right ICA plaque to the skull base. Left carotid system: Left CCA origin and other plaque without stenosis proximal to the bifurcation. Bulky calcified plaque at the left ICA origin and bulb resulting in 55-60 % stenosis with respect to the distal vessel (series 10, image 101). Mildly tortuous left ICA. Vertebral arteries: Proximal right subclavian plaque without stenosis. The right vertebral artery origin appear spared. Mild right V2 segment plaque, no right vertebral stenosis to the skull base. Proximal left subclavian artery soft and calcified plaque is extensive although less than 50 % stenosis with respect to the distal vessel results. Mild stenosis at the left vertebral artery origin. Additional mild soft plaque in the left V1 segment. Patent and slightly non dominant left vertebral artery to the skull base without significant stenosis. CTA HEAD Posterior circulation: Bulky and circumferential calcified plaque of the left V4 segment with high-grade stenosis on series 9, image 145. This is concordant with the MRA yesterday. Mild right V4 calcified plaque without significant stenosis. The left vertebral and left PICA remain patent despite the high-grade stenosis. There is moderate stenosis again at the left vertebrobasilar junction. Patent basilar artery with mild irregularity and no significant stenosis. Patent SCA and PCA origins. Posterior communicating arteries are diminutive or absent. Severe left PCA stenosis from the P2 segment on word.  Moderate to severe right P2 stenosis with better distal right PCA enhancement. This is concordant with the MRA. Anterior circulation: Both ICA siphons are patent. Calcified plaque on the left with only mild supraclinoid segment stenosis. Right side calcified plaque and tortuosity with mild supraclinoid stenosis. Patent bilateral carotid termini. Patent MCA and ACA origins. Anterior communicating artery, bilateral ACA branches, right MCA M1, and right MCA bifurcation are patent without stenosis. Mild to moderate right M2 and M3 branch irregularities and stenosis appear similar to the MRA. Left MCA M1 demonstrates a relatively long 6 mm segment of moderate to severe stenosis best seen on series 12, image 20. This leads up to the left MCA trifurcation which remains patent. Left MCA branches demonstrate fairly symmetric enhancement to those on the right, and only mild additional irregularity. Venous sinuses: Early contrast timing, not evaluated. Anatomic variants: Mildly dominant right vertebral artery. Review of the MIP images confirms the above findings IMPRESSION: 1. Compensated Severe Left MCA M1 stenosis. CTP is negative for infarct core or penumbra. And CT appearance of the brain is stable. 2. Other intracranial CTA findings concordant to the MRA yesterday including: - Severe Left Vertebral V4 segment stenosis. - Moderate to severe bilateral PCA stenosis worse on the left. 3. Extracranial calcified plaque resulting in bilateral carotid bifurcation stenosis affecting the ICAs: 60% on the Right and 55-60% on the Left . 4. No significant cervical vertebral artery stenosis. 5. Aortic Atherosclerosis (ICD10-I70.0) and Emphysema (ICD10-J43.9). Electronically Signed   By: Genevie Ann M.D.   On: 07/20/2019 04:20   MR ANGIO HEAD WO CONTRAST  Result Date: 07/20/2019 CLINICAL DATA:  Follow-up examination for acute stroke. EXAM: MRA HEAD WITHOUT CONTRAST TECHNIQUE: Angiographic images of the Circle of Willis were  obtained  using MRA technique without intravenous contrast. COMPARISON:  Comparison made with prior brain MRI from earlier the same day. FINDINGS: ANTERIOR CIRCULATION: Examination mildly degraded by motion artifact. Distal cervical segments of both internal carotid arteries are widely patent with symmetric antegrade flow. Petrous, cavernous, and supraclinoid ICAs widely patent without stenosis or other abnormality. A1 segments patent. Normal anterior communicating artery complex. Anterior cerebral arteries patent to their distal aspects without stenosis. Right M1 widely patent. Normal right MCA bifurcation. Distal right MCA branches well perfused. Focal severe stenosis seen involving the mid-distal left M1 segment (series 1045, image 13). Stenosis measures approximately 6 mm in length. Left MCA bifurcation patent distally. Distal left MCA branches are perfused, although mildly attenuated as compared to the contralateral right MCA branches. POSTERIOR CIRCULATION: Right vertebral artery dominant and widely patent to the vertebrobasilar junction. Left vertebral artery not seen as it courses into the cranial vault, and may be partially occluded. Flow related signal is seen within the distal left V4 segment with filling of the left PICA, which could be retrograde in nature across the vertebrobasilar junction. Right PICA not seen. Basilar widely patent to its distal aspect without stenosis. Superior cerebral arteries patent bilaterally. Both PCAs primarily supplied via the basilar. Atheromatous change with associated moderate bilateral P2 stenoses noted. PCAs remain patent to their distal aspects. No intracranial aneurysm. IMPRESSION: 1. 6 mm short segment severe stenosis involving the mid-distal left M1 segment. Distal left MCA branches are perfused, although attenuated as compared to the contralateral right MCA branches. 2. Nonvisualization of the left vertebral artery as it courses into the cranial vault, which may be partially  occluded. Retrograde filling of the left V4 segment and left PICA across the vertebrobasilar junction. 3. Atheromatous change throughout the PCAs bilaterally with associated moderate bilateral P2 stenoses. Electronically Signed   By: Jeannine Boga M.D.   On: 07/20/2019 02:00   MR BRAIN WO CONTRAST  Result Date: 07/20/2019 CLINICAL DATA:  Stroke follow-up. Right facial droop and mild right hemiparesis. EXAM: MRI HEAD WITHOUT CONTRAST TECHNIQUE: Multiplanar, multiecho pulse sequences of the brain and surrounding structures were obtained without intravenous contrast. COMPARISON:  Head CT, CTA, and CTP 07/20/2019 and MRI 07/19/2019 FINDINGS: At the request of the ordering neurologist, only axial and coronal diffusion weighted imaging was obtained. There are new punctate acute infarcts involving the left corona radiata and superior left lentiform nucleus. No intracranial mass effect or extra-axial fluid collection is identified. Assessment of chronic findings including chronic small vessel ischemia in the cerebral white matter is deferred to yesterday's complete brain MRI. IMPRESSION: Punctate acute infarcts in the left corona radiata and basal ganglia. Electronically Signed   By: Logan Bores M.D.   On: 07/20/2019 20:49   MR BRAIN WO CONTRAST  Result Date: 07/19/2019 CLINICAL DATA:  Ataxia. EXAM: MRI HEAD WITHOUT CONTRAST TECHNIQUE: Multiplanar, multiecho pulse sequences of the brain and surrounding structures were obtained without intravenous contrast. COMPARISON:  Head CT 06/08/2016 FINDINGS: Brain: Diffusion imaging does not show any acute or subacute infarction. There chronic small-vessel ischemic changes of the pons. No focal cerebellar stroke. There is an old left occipital cortical and subcortical infarction. There are chronic small-vessel ischemic changes of the cerebral hemispheric white matter. Old small vessel infarction in the right thalamus. No sign of mass lesion, hemorrhage, hydrocephalus  or extra-axial collection. Vascular: Major vessels at the base of the brain show flow. Skull and upper cervical spine: 2 foci of signal within the posterior parietal calvarium towards the  vertex, most consistent with hemangiomas. Sinuses/Orbits: Clear/normal Other: None IMPRESSION: No acute brain finding. No abnormality seen to explain acute ataxia. Chronic small-vessel change of the pons and hemispheric white matter. 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 Left mastoid effusion. Electronically Signed   By: Nelson Chimes M.D.   On: 07/19/2019 18:52   CT Code Stroke Cerebral Perfusion with contrast  Result Date: 07/20/2019 CLINICAL DATA:  86 year old female with neurologic deficit. Unrevealing brain MRI yesterday, but MRA with severe left M1 stenosis. EXAM: CT ANGIOGRAPHY HEAD AND NECK CT PERFUSION BRAIN TECHNIQUE: Multidetector CT imaging of the head and neck was performed using the standard protocol during bolus administration of intravenous contrast. Multiplanar CT image reconstructions and MIPs were obtained to evaluate the vascular anatomy. Carotid stenosis measurements (when applicable) are obtained utilizing NASCET criteria, using the distal internal carotid diameter as the denominator. Multiphase CT imaging of the brain was performed following IV bolus contrast injection. Subsequent parametric perfusion maps were calculated using RAPID software. CONTRAST:  23mL OMNIPAQUE IOHEXOL 350 MG/ML SOLN COMPARISON:  Brain MRI and intracranial MRA 07/19/2019. FINDINGS: CT Brain Perfusion Findings: ASPECTS: 10 CBF (<30%) Volume: None Perfusion (Tmax>6.0s) volume: None, and no significant T-max asymmetry from the left to right hemisphere. Mismatch Volume: Not applicable Infarction Location:Not applicable CT HEAD Brain: Calcified atherosclerosis at the skull base. Stable cortical encephalomalacia in the left occipital pole. Patchy bilateral white matter hypodensity is stable. Dystrophic basal ganglia  calcifications again noted. No midline shift, ventriculomegaly, mass effect, evidence of mass lesion, intracranial hemorrhage or evidence of cortically based acute infarction. Calvarium and skull base: Mild chronic right orbital floor fracture. No acute osseous abnormality identified. Paranasal sinuses: Sinuses and mastoids are largely clear today. Orbits: No acute orbit or scalp soft tissue finding. CTA NECK Skeleton: Advanced degenerative changes throughout the cervical spine. No acute osseous abnormality identified. Upper chest: Centrilobular emphysema. Mild apical lung scarring. No superior mediastinal lymphadenopathy. Sequelae of right axillary node dissection. Other neck: No acute findings. Aortic arch: Fairly bulky soft and calcified plaque throughout the aortic arch. Three vessel arch configuration. Right carotid system: Brachiocephalic artery and right CCA origin plaque without stenosis. Intermittent plaque in the right CCA without stenosis proximal to the bifurcation. Bulky calcified plaque at the carotid bifurcation and continuing into the proximal right ICA resulting in up to 60 % stenosis with respect to the distal vessel (series 7, image 103). Distal to the bulb no other right ICA plaque to the skull base. Left carotid system: Left CCA origin and other plaque without stenosis proximal to the bifurcation. Bulky calcified plaque at the left ICA origin and bulb resulting in 55-60 % stenosis with respect to the distal vessel (series 10, image 101). Mildly tortuous left ICA. Vertebral arteries: Proximal right subclavian plaque without stenosis. The right vertebral artery origin appear spared. Mild right V2 segment plaque, no right vertebral stenosis to the skull base. Proximal left subclavian artery soft and calcified plaque is extensive although less than 50 % stenosis with respect to the distal vessel results. Mild stenosis at the left vertebral artery origin. Additional mild soft plaque in the left V1  segment. Patent and slightly non dominant left vertebral artery to the skull base without significant stenosis. CTA HEAD Posterior circulation: Bulky and circumferential calcified plaque of the left V4 segment with high-grade stenosis on series 9, image 145. This is concordant with the MRA yesterday. Mild right V4 calcified plaque without significant stenosis. The left vertebral and left PICA remain patent despite the  high-grade stenosis. There is moderate stenosis again at the left vertebrobasilar junction. Patent basilar artery with mild irregularity and no significant stenosis. Patent SCA and PCA origins. Posterior communicating arteries are diminutive or absent. Severe left PCA stenosis from the P2 segment on word. Moderate to severe right P2 stenosis with better distal right PCA enhancement. This is concordant with the MRA. Anterior circulation: Both ICA siphons are patent. Calcified plaque on the left with only mild supraclinoid segment stenosis. Right side calcified plaque and tortuosity with mild supraclinoid stenosis. Patent bilateral carotid termini. Patent MCA and ACA origins. Anterior communicating artery, bilateral ACA branches, right MCA M1, and right MCA bifurcation are patent without stenosis. Mild to moderate right M2 and M3 branch irregularities and stenosis appear similar to the MRA. Left MCA M1 demonstrates a relatively long 6 mm segment of moderate to severe stenosis best seen on series 12, image 20. This leads up to the left MCA trifurcation which remains patent. Left MCA branches demonstrate fairly symmetric enhancement to those on the right, and only mild additional irregularity. Venous sinuses: Early contrast timing, not evaluated. Anatomic variants: Mildly dominant right vertebral artery. Review of the MIP images confirms the above findings IMPRESSION: 1. Compensated Severe Left MCA M1 stenosis. CTP is negative for infarct core or penumbra. And CT appearance of the brain is stable. 2. Other  intracranial CTA findings concordant to the MRA yesterday including: - Severe Left Vertebral V4 segment stenosis. - Moderate to severe bilateral PCA stenosis worse on the left. 3. Extracranial calcified plaque resulting in bilateral carotid bifurcation stenosis affecting the ICAs: 60% on the Right and 55-60% on the Left . 4. No significant cervical vertebral artery stenosis. 5. Aortic Atherosclerosis (ICD10-I70.0) and Emphysema (ICD10-J43.9). Electronically Signed   By: Genevie Ann M.D.   On: 07/20/2019 04:20   DG CHEST PORT 1 VIEW  Result Date: 07/24/2019 CLINICAL DATA:  Shortness of breath. EXAM: PORTABLE CHEST 1 VIEW COMPARISON:  July 21, 2019. FINDINGS: The heart size and mediastinal contours are within normal limits. No pneumothorax or pleural effusion is noted. Atherosclerosis of thoracic aorta is noted. Both lungs are clear. The visualized skeletal structures are unremarkable. IMPRESSION: No active disease. Aortic Atherosclerosis (ICD10-I70.0). Electronically Signed   By: Marijo Conception M.D.   On: 07/24/2019 14:48   DG CHEST PORT 1 VIEW  Result Date: 07/21/2019 CLINICAL DATA:  86 year old female with bilateral crackle. EXAM: PORTABLE CHEST 1 VIEW COMPARISON:  Chest radiograph dated 07/21/2019. FINDINGS: Probable trace left pleural effusion or pleural thickening with minimal left lung base atelectasis. No focal consolidation, or pneumothorax. The cardiac silhouette is within limits. Atherosclerotic calcification of the aorta. Probable old healed left lateral rib fracture. No acute osseous pathology. Degenerative changes of the spine. Right axillary surgical clips. IMPRESSION: Probable trace left pleural effusion with minimal left lung base atelectasis. Electronically Signed   By: Anner Crete M.D.   On: 07/21/2019 21:26   DG CHEST PORT 1 VIEW  Result Date: 07/21/2019 CLINICAL DATA:  Cough. EXAM: PORTABLE CHEST 1 VIEW COMPARISON:  July 20, 2019 FINDINGS: The heart, hila, mediastinum, lungs, and  pleura are unchanged and unremarkable. IMPRESSION: No active disease. Electronically Signed   By: Dorise Bullion III M.D   On: 07/21/2019 11:21   DG CHEST PORT 1 VIEW  Result Date: 07/20/2019 CLINICAL DATA:  Hypothermia EXAM: PORTABLE CHEST 1 VIEW COMPARISON:  07/19/2019 FINDINGS: There is hyperinflation of the lungs compatible with COPD. Heart is normal size. Aortic atherosclerosis. No confluent opacities or  effusions. No acute bony abnormality. IMPRESSION: COPD.  No active disease. Electronically Signed   By: Rolm Baptise M.D.   On: 07/20/2019 02:27   DG Chest Port 1 View  Result Date: 07/19/2019 CLINICAL DATA:  [place]ales EXAM: PORTABLE CHEST 1 VIEW COMPARISON:  07/17/2019 FINDINGS: Single frontal view of the chest was performed, excluding the right costophrenic angle by collimation. The cardiac silhouette is unremarkable. Stable atherosclerosis of the aortic arch. No airspace disease, effusion, or pneumothorax. No acute bony abnormalities. IMPRESSION: 1. Stable exam, no acute process. Electronically Signed   By: Randa Ngo M.D.   On: 07/19/2019 18:31   DG Swallowing Func-Speech Pathology  Result Date: 07/24/2019 Objective Swallowing Evaluation: Type of Study: MBS-Modified Barium Swallow Study  Patient Details Name: [name] [name] MRN: [id number] Date of Birth: 10-Jun-1927 Today's Date: 07/24/2019 Time: SLP Start Time (ACUTE ONLY): 1215 -SLP Stop Time (ACUTE ONLY): 1245 SLP Time Calculation (min) (ACUTE ONLY): 30 min Past Medical History: Past Medical History: Diagnosis Date . CARCINOMA, BASAL CELL 02/13/2006 . CAROTID ARTERY DISEASE 10/03/2006  Carotid US 11/17: Stable 40-59% bilateral ICA stenosis >> f/u 1 year . CHRONIC OBSTRUCTIVE PULMONARY DISEASE, ACUTE EXACERBATION 11/17/2009 . Chronic rhinitis 12/13/2006 . CORONARY ARTERY DISEASE 02/13/2006 . DEGENERATIVE JOINT DISEASE 09/27/2006 . DERMATITIS 09/25/2007 . Head trauma   closed . HYPERLIPIDEMIA 03/10/2008 . HYPERTENSION 02/13/2006 . HYPOTHYROIDISM  01/30/2007 . LIVER FUNCTION TESTS, ABNORMAL 01/22/2009 . LOSS, HEARING NOS 08/25/2006 . Near syncope 09/18/2014 . NEOPLASM, SKIN, UNCERTAIN BEHAVIOR 62/95/2841 . NEPHROLITHIASIS, HX OF 02/27/2010 . OSTEOARTHRITIS, HAND 12/16/2008 . PERIPHERAL VASCULAR DISEASE 02/13/2006 . Personal history of malignant neoplasm of breast 07/24/2008 . Personal history of urinary calculi  . Post-traumatic wound infection 12/01/2010  Mild streaking superiorly and on one day of septra  Will give rocephin and add keflex  adn follow up  . POSTHERPETIC NEURALGIA 08/25/2006 . RENAL ARTERY STENOSIS 08/01/2008 . Shingles   recurrent . SPINAL STENOSIS 11/01/2006 . THROMBOCYTOPENIA 07/30/2008 . UNS ADVRS EFF UNS RX MEDICINAL&BIOLOGICAL SBSTNC 01/30/2007 . UNSPECIFIED ANEMIA 03/18/2008 . Unspecified vitamin D deficiency 02/07/2007 . UNSTABLE ANGINA 12/12/2009 . Vitreous detachment  Past Surgical History: Past Surgical History: Procedure Laterality Date . ABDOMINAL HYSTERECTOMY   . BREAST LUMPECTOMY   . BREAST LUMPECTOMY  1998 . CATARACT EXTRACTION   . CORONARY ANGIOPLASTY WITH STENT PLACEMENT    bilat iliac stents, left renal artery and rt coronary artery last myoview 8/09 with EF 70% . LUMBAR LAMINECTOMY   . SKIN BIOPSY  08/24/2017  Invasive squamous cell carcinoma . SKIN CANCER EXCISION   . TONSILLECTOMY   HPI: [name] [name] is a 86 y.o. female who presents to the ED for evaluation of left-sided weakness and intermittent expressive aphasia.  MRI showed multiple punctate strokes at left BG and corona radiata. Initial bedside swallow eval was completed 6/18. Patient reported to have worsening of right sided weakness-->now flaccid with significant deficits.  Subjective: alert Assessment / Plan / Recommendation CHL IP CLINICAL IMPRESSIONS 07/24/2019 Clinical Impression Pt presents with moderate oropharyngeal dysphagia with intermittent aspiration of thin liquids before/during the swallow response - aspiration was not consistently accompanied by a cough  response - it was occasionally silent in nature.  There was prolonged and decreased coordination of the oral phase of the swallow, with spillage from right side of oral cavity and premature spillage over base of tongue.  Aspiration was generally due to a problem with timing of laryngeal vestibule closure.  Nectar-thick liquids allowed for improved timing and no observed penetration/aspiration.  Pt  had difficulty following commands for postural/strategic adjustments.  Anatomy was remarkable for degenerative changes in the cervical spine - this did not impact function. Calcified plaque in the arteries was also notable.  Recommend changing diet to dysphagia 3, nectar-thick liquids. Treatment should focus on improving coordination/sensory reception within oral cavity as well as timing of pharyngeal response.   SLP Visit Diagnosis Dysphagia, oropharyngeal phase (R13.12) Attention and concentration deficit following -- Frontal lobe and executive function deficit following -- Impact on safety and function Moderate aspiration risk   CHL IP TREATMENT RECOMMENDATION 07/24/2019 Treatment Recommendations Therapy as outlined in treatment plan below   Prognosis 07/20/2019 Prognosis for Safe Diet Advancement Good Barriers to Reach Goals -- Barriers/Prognosis Comment -- CHL IP DIET RECOMMENDATION 07/24/2019 SLP Diet Recommendations Dysphagia 3 (Mech soft) solids;Nectar thick liquid Liquid Administration via Cup Medication Administration Crushed with puree Compensations Slow rate;Small sips/bites Postural Changes Remain semi-upright after after feeds/meals (Comment)   CHL IP OTHER RECOMMENDATIONS 07/24/2019 Recommended Consults -- Oral Care Recommendations Oral care BID Other Recommendations Order thickener from pharmacy   CHL IP FOLLOW UP RECOMMENDATIONS 07/24/2019 Follow up Recommendations Skilled Nursing facility   [facility] IP FREQUENCY AND DURATION 07/24/2019 Speech Therapy Frequency (ACUTE ONLY) min 2x/week Treatment Duration 1 week       CHL IP ORAL PHASE 07/24/2019 Oral Phase Impaired Oral - Pudding Teaspoon -- Oral - Pudding Cup -- Oral - Honey Teaspoon -- Oral - Honey Cup -- Oral - Nectar Teaspoon -- Oral - Nectar Cup Right anterior bolus loss;Weak lingual manipulation;Right pocketing in lateral sulci;Delayed oral transit;Decreased bolus cohesion Oral - Nectar Straw -- Oral - Thin Teaspoon Right anterior bolus loss;Weak lingual manipulation;Right pocketing in lateral sulci;Delayed oral transit;Decreased bolus cohesion Oral - Thin Cup Right anterior bolus loss;Weak lingual manipulation;Right pocketing in lateral sulci;Delayed oral transit;Decreased bolus cohesion Oral - Thin Straw Right anterior bolus loss;Weak lingual manipulation;Right pocketing in lateral sulci;Delayed oral transit;Decreased bolus cohesion Oral - Puree Weak lingual manipulation;Right pocketing in lateral sulci;Delayed oral transit;Decreased bolus cohesion Oral - Mech Soft -- Oral - Regular Weak lingual manipulation;Right pocketing in lateral sulci;Delayed oral transit;Decreased bolus cohesion Oral - Multi-Consistency -- Oral - Pill -- Oral Phase - Comment --  CHL IP PHARYNGEAL PHASE 07/24/2019 Pharyngeal Phase Impaired Pharyngeal- Pudding Teaspoon -- Pharyngeal -- Pharyngeal- Pudding Cup -- Pharyngeal -- Pharyngeal- Honey Teaspoon -- Pharyngeal -- Pharyngeal- Honey Cup -- Pharyngeal -- Pharyngeal- Nectar Teaspoon -- Pharyngeal -- Pharyngeal- Nectar Cup Delayed swallow initiation-vallecula;Delayed swallow initiation-pyriform sinuses;Reduced tongue base retraction Pharyngeal -- Pharyngeal- Nectar Straw -- Pharyngeal -- Pharyngeal- Thin Teaspoon Delayed swallow initiation-pyriform sinuses Pharyngeal -- Pharyngeal- Thin Cup Delayed swallow initiation-pyriform sinuses;Reduced airway/laryngeal closure;Penetration/Aspiration before swallow;Penetration/Aspiration during swallow;Trace aspiration;Pharyngeal residue - valleculae Pharyngeal Material enters airway, passes BELOW cords and not  ejected out despite cough attempt by patient;Material enters airway, passes BELOW cords without attempt by patient to eject out (silent aspiration) Pharyngeal- Thin Straw Delayed swallow initiation-pyriform sinuses;Reduced airway/laryngeal closure;Penetration/Aspiration before swallow;Penetration/Aspiration during swallow;Trace aspiration;Pharyngeal residue - valleculae Pharyngeal Material enters airway, passes BELOW cords and not ejected out despite cough attempt by patient;Material enters airway, passes BELOW cords without attempt by patient to eject out (silent aspiration) Pharyngeal- Puree Delayed swallow initiation-vallecula;Pharyngeal residue - valleculae Pharyngeal -- Pharyngeal- Mechanical Soft -- Pharyngeal -- Pharyngeal- Regular Delayed swallow initiation-vallecula;Pharyngeal residue - valleculae Pharyngeal -- Pharyngeal- Multi-consistency -- Pharyngeal -- Pharyngeal- Pill -- Pharyngeal -- Pharyngeal Comment --  CHL IP CERVICAL ESOPHAGEAL PHASE 02/04/2017 Cervical Esophageal Phase (No Data) Pudding Teaspoon -- Pudding Cup -- Honey Teaspoon -- Honey  Cup -- Surveyor, quantity Cup -- [employer] -- Thin Teaspoon -- Thin Cup -- Thin Straw -- Puree -- Mechanical Soft -- Regular -- Multi-consistency -- Pill -- Cervical Esophageal Comment -- [name] [name] 07/24/2019, 1:28 PM              ECHOCARDIOGRAM COMPLETE  Result Date: 07/20/2019    ECHOCARDIOGRAM REPORT   Patient Name:   [name] [name] Date of Exam: 07/20/2019 Medical Rec #:  [id number]             Height:       64.0 in Accession #:    [id number]            Weight:       100.0 lb Date of Birth:  04-16-1927            BSA:          1.457 m Patient Age:    44 years              BP:           155/63 mmHg Patient Gender: F                     HR:           62 bpm. Exam Location:  Inpatient Procedure: 2D Echo, Cardiac Doppler and Color Doppler Indications:    TIA 435.9 / G45.9  History:        Patient has prior history of Echocardiogram  examinations, most                 recent 11/16/2015. CAD, Carotid Disease; Risk                 Factors:Hypertension and Dyslipidemia. Hypothyroidism.  Sonographer:    Jonelle Sidle Dance Referring Phys: [id number] [place]  1. Left ventricular ejection fraction, by estimation, is 60 to 65%. The left ventricle has normal function. The left ventricle has no regional wall motion abnormalities. Left ventricular diastolic parameters are consistent with Grade I diastolic dysfunction (impaired relaxation). Elevated left atrial pressure.  2. Right ventricular systolic function is normal. The right ventricular size is normal. There is normal pulmonary artery systolic pressure. The estimated right ventricular systolic pressure is 81.4 mmHg.  3. The mitral valve is normal in structure. Mild mitral valve regurgitation. No evidence of mitral stenosis.  4. Tricuspid valve regurgitation is moderate.  5. The aortic valve is normal in structure. Aortic valve regurgitation is trivial. Mild aortic valve sclerosis is present, with no evidence of aortic valve stenosis.  6. The inferior vena cava is normal in size with greater than 50% respiratory variability, suggesting right atrial pressure of 3 mmHg. FINDINGS  Left Ventricle: Left ventricular ejection fraction, by estimation, is 60 to 65%. The left ventricle has normal function. The left ventricle has no regional wall motion abnormalities. The left ventricular internal cavity size was normal in size. There is  no left ventricular hypertrophy. Left ventricular diastolic parameters are consistent with Grade I diastolic dysfunction (impaired relaxation). Elevated left atrial pressure. Right Ventricle: The right ventricular size is normal. No increase in right ventricular wall thickness. Right ventricular systolic function is normal. There is normal pulmonary artery systolic pressure. The tricuspid regurgitant velocity is 2.30 m/s, and  with an assumed right atrial pressure of  8 mmHg, the estimated right ventricular systolic pressure is 48.1 mmHg. Left Atrium: Left atrial size was normal in size. Right Atrium:  Right atrial size was normal in size. Pericardium: There is no evidence of pericardial effusion. Mitral Valve: The mitral valve is normal in structure. Normal mobility of the mitral valve leaflets. Mild mitral valve regurgitation. No evidence of mitral valve stenosis. Tricuspid Valve: The tricuspid valve is normal in structure. Tricuspid valve regurgitation is moderate . No evidence of tricuspid stenosis. Aortic Valve: The aortic valve is normal in structure. Aortic valve regurgitation is trivial. Aortic regurgitation PHT measures 503 msec. Mild aortic valve sclerosis is present, with no evidence of aortic valve stenosis. Pulmonic Valve: The pulmonic valve was normal in structure. Pulmonic valve regurgitation is not visualized. No evidence of pulmonic stenosis. Aorta: The aortic root is normal in size and structure. Venous: The inferior vena cava is normal in size with greater than 50% respiratory variability, suggesting right atrial pressure of 3 mmHg. IAS/Shunts: No atrial level shunt detected by color flow Doppler.  LEFT VENTRICLE PLAX 2D LVIDd:         3.90 cm  Diastology LVIDs:         2.70 cm  LV e' lateral:   4.40 cm/s LV PW:         0.90 cm  LV E/e' lateral: 12.4 LV IVS:        0.80 cm  LV e' medial:    3.50 cm/s LVOT diam:     2.20 cm  LV E/e' medial:  15.5 LV SV:         67 LV SV Index:   46 LVOT Area:     3.80 cm  RIGHT VENTRICLE             IVC RV Basal diam:  3.40 cm     IVC diam: 2.20 cm RV Mid diam:    2.30 cm RV S prime:     10.90 cm/s TAPSE (M-mode): 2.5 cm LEFT ATRIUM             Index       RIGHT ATRIUM           Index LA diam:        3.10 cm 2.13 cm/m  RA Area:     15.40 cm LA Vol (A2C):   19.7 ml 13.52 ml/m RA Volume:   37.60 ml  25.80 ml/m LA Vol (A4C):   23.3 ml 15.99 ml/m LA Biplane Vol: 21.6 ml 14.82 ml/m  AORTIC VALVE LVOT Vmax:   66.60 cm/s LVOT  Vmean:  43.000 cm/s LVOT VTI:    0.176 m AI PHT:      503 msec  AORTA Ao Root diam: 2.80 cm Ao Asc diam:  3.50 cm MITRAL VALVE               TRICUSPID VALVE MV Area (PHT): 2.22 cm    TR Peak grad:   21.2 mmHg MV Decel Time: 342 msec    TR Vmax:        230.00 cm/s MV E velocity: 54.40 cm/s MV A velocity: 75.80 cm/s  SHUNTS MV E/A ratio:  0.72        Systemic VTI:  0.18 m                            Systemic Diam: 2.20 cm Dani Gobble Croitoru MD Electronically signed by Sanda Klein MD Signature Date/Time: 07/20/2019/10:39:24 AM    Final    VAS US CAROTID (at [facility] and [facility] only)  Result Date: 07/20/2019 Carotid Arterial  Duplex Study Indications:       TIA and Carotid artery disease. Risk Factors:      Hypertension, hyperlipidemia. Comparison Study:  03/22/17 bilateral 40-59% Performing Technologist: June Leap RDMS, RVT  Examination Guidelines: A complete evaluation includes B-mode imaging, spectral Doppler, color Doppler, and power Doppler as needed of all accessible portions of each vessel. Bilateral testing is considered an integral part of a complete examination. Limited examinations for reoccurring indications may be performed as noted.  Right Carotid Findings: +----------+--------+--------+--------+--------------------------+--------+           PSV cm/sEDV cm/sStenosisPlaque Description        Comments +----------+--------+--------+--------+--------------------------+--------+ CCA Prox  57      6                                                  +----------+--------+--------+--------+--------------------------+--------+ CCA Distal73      11                                                 +----------+--------+--------+--------+--------------------------+--------+ ICA Prox  166     33      40-59%  heterogenous and irregular         +----------+--------+--------+--------+--------------------------+--------+ ICA Distal66      19                                                  +----------+--------+--------+--------+--------------------------+--------+ ECA       152     12                                                 +----------+--------+--------+--------+--------------------------+--------+ +----------+--------+-------+----------------+-------------------+           PSV cm/sEDV cmsDescribe        Arm Pressure (mmHG) +----------+--------+-------+----------------+-------------------+ [id number]             Multiphasic, WNL                    +----------+--------+-------+----------------+-------------------+ +---------+--------+--+--------+--+---------+ VertebralPSV cm/s41EDV cm/s11Antegrade +---------+--------+--+--------+--+---------+  Left Carotid Findings: +----------+--------+--------+--------+--------------------+-------------------+           PSV cm/sEDV cm/sStenosisPlaque Description  Comments            +----------+--------+--------+--------+--------------------+-------------------+ CCA Prox  64      9                                                       +----------+--------+--------+--------+--------------------+-------------------+ CCA Distal54      15                                                      +----------+--------+--------+--------+--------------------+-------------------+ ICA  Prox  115     23      1-39%   calcific and        acoustic shadowing                                    irregular           may obscure higher                                                        velocities          +----------+--------+--------+--------+--------------------+-------------------+ ICA Distal96      13                                                      +----------+--------+--------+--------+--------------------+-------------------+ ECA       61      3                                                       +----------+--------+--------+--------+--------------------+-------------------+  +----------+--------+--------+--------+-------------------+           PSV cm/sEDV cm/sDescribeArm Pressure (mmHG) +----------+--------+--------+--------+-------------------+ [id number]             Stenotic                    +----------+--------+--------+--------+-------------------+ +---------+--------+--+--------+--+---------+ VertebralPSV cm/s97EDV cm/s12Antegrade +---------+--------+--+--------+--+---------+   Summary: Right Carotid: Velocities in the right ICA are consistent with a 40-59%                stenosis. Left Carotid: Velocities in the left ICA are consistent with a 1-39% stenosis. Subclavians: Left subclavian artery was stenotic. *See table(s) above for measurements and observations.  Electronically signed by Antony Contras MD on 07/20/2019 at 10:57:45 AM.    Final      Antimicrobials:   NONE    Subjective: nO ACUTE EVENTS OVERNIGHT, RESTING IN BED COMFORTABLY  Objective: Vitals:   07/28/19 0741 07/28/19 0755 07/28/19 0801 07/28/19 1142  BP: (!) 180/55   (!) 103/58  Pulse: (!) 54 (!) 54  (!) 57  Resp: 16 19  18   Temp: 98.2 F (36.8 C)   97.7 F (36.5 C)  TempSrc: Axillary   Axillary  SpO2: 99% 96% 96% 96%  Weight:      Height:        Intake/Output Summary (Last 24 hours) at 07/28/2019 1205 Last data filed at 07/28/2019 0900 Gross per 24 hour  Intake 1161.79 ml  Output --  Net 1161.79 ml   Filed Weights   07/19/19 1743  Weight: 45.4 kg    Examination:  General exam: Appears calm and comfortable  Respiratory system: Clear to auscultation. Respiratory effort normal. Cardiovascular system: S1 & S2 heard, RRR. No JVD, murmurs, rubs, gallops or clicks. No pedal edema. Gastrointestinal system: Abdomen is nondistended, soft and nontender. No organomegaly or masses felt. Normal bowel sounds heard. Central nervous system: Alert, DYSARHTIC, ASYM  UE STENGTH Extremities: wwp, trace edema Skin: No rashes, lesions or ulcers Psychiatry: FLAT AFFECT.      Data Reviewed: I have personally reviewed following labs and imaging studies  CBC: No results for input(s): WBC, NEUTROABS, HGB, HCT, MCV, PLT in the last 168 hours. Basic Metabolic Panel: Recent Labs  Lab 07/22/19 0841 07/25/19 1949 07/26/19 0908 07/27/19 0508 07/28/19 0327  NA 143 145 148* 148* 149*  K 3.5 3.4* 3.3* 3.7 3.9  CL 106 111 113* 112* 115*  CO2 28 21* 22 23 21*  GLUCOSE 107* 180* 102* 129* 106*  BUN 19 66* 58* 57* 61*  CREATININE 0.97 1.44* 1.21* 1.25* 1.29*  CALCIUM 9.1 9.5 9.5 9.7 9.5  MG  --  2.5*  --   --   --   PHOS 3.7 3.8  --   --   --    GFR: Estimated Creatinine Clearance: 21.2 mL/min (A) (by C-G formula based on SCr of 1.29 mg/dL (H)). Liver Function Tests: Recent Labs  Lab 07/25/19 1949  ALBUMIN 2.8*   No results for input(s): LIPASE, AMYLASE in the last 168 hours. No results for input(s): AMMONIA in the last 168 hours. Coagulation Profile: No results for input(s): INR, PROTIME in the last 168 hours. Cardiac Enzymes: No results for input(s): CKTOTAL, CKMB, CKMBINDEX, TROPONINI in the last 168 hours. BNP (last 3 results) No results for input(s): PROBNP in the last 8760 hours. HbA1C: No results for input(s): HGBA1C in the last 72 hours. CBG: No results for input(s): GLUCAP in the last 168 hours. Lipid Profile: No results for input(s): CHOL, HDL, LDLCALC, TRIG, CHOLHDL, LDLDIRECT in the last 72 hours. Thyroid Function Tests: No results for input(s): TSH, T4TOTAL, FREET4, T3FREE, THYROIDAB in the last 72 hours. Anemia Panel: No results for input(s): VITAMINB12, FOLATE, FERRITIN, TIBC, IRON, RETICCTPCT in the last 72 hours. Sepsis Labs: No results for input(s): PROCALCITON, LATICACIDVEN in the last 168 hours.  Recent Results (from the past 240 hour(s))  SARS Coronavirus 2 by RT PCR (hospital order, performed in [facility] hospital lab) Nasopharyngeal Nasopharyngeal Swab     Status: None   Collection Time: 07/19/19 10:53 PM   Specimen:  Nasopharyngeal Swab  Result Value Ref Range Status   SARS Coronavirus 2 NEGATIVE NEGATIVE Final    Comment: (NOTE) SARS-CoV-2 target nucleic acids are NOT DETECTED.  The SARS-CoV-2 RNA is generally detectable in upper and lower respiratory specimens during the acute phase of infection. The lowest concentration of SARS-CoV-2 viral copies this assay can detect is 250 copies / mL. A negative result does not preclude SARS-CoV-2 infection and should not be used as the sole basis for treatment or other patient management decisions.  A negative result may occur with improper specimen collection / handling, submission of specimen other than nasopharyngeal swab, presence of viral mutation(s) within the areas targeted by this assay, and inadequate number of viral copies (<250 copies / mL). A negative result must be combined with clinical observations, patient history, and epidemiological information.  Fact Sheet for Patients:   StrictlyIdeas.no  Fact Sheet for Healthcare Providers: BankingDealers.co.za  This test is not yet approved or  cleared by the [place] FDA and has been authorized for detection and/or diagnosis of SARS-CoV-2 by FDA under an Emergency Use Authorization (EUA).  This EUA will remain in effect (meaning this test can be used) for the duration of the COVID-19 declaration under Section 564(b)(1) of the Act, 21 U.S.C. section 360bbb-3(b)(1), unless the authorization is terminated or revoked  sooner.  Performed at [facility], [place] [address] [place], [place] [address]          Radiology Studies: No results found.      Scheduled Meds: . amLODipine  10 mg Oral Daily  . aspirin EC  325 mg Oral Daily  . budesonide  1 mg Inhalation BID  . clopidogrel  75 mg Oral Daily  . ezetimibe  10 mg Oral Daily  . guaiFENesin  1,200 mg Oral BID  . heparin  5,000 Units Subcutaneous Q8H  . isosorbide mononitrate  30 mg  Oral Daily  . metoprolol succinate  25 mg Oral Daily  . multivitamin with minerals  1 tablet Oral Daily  . nutrition supplement (JUVEN)  1 packet Oral BID BM  . polyethylene glycol  17 g Oral Daily  . polyvinyl alcohol  1 drop Both Eyes QID  . predniSONE  40 mg Oral Q breakfast  . umeclidinium bromide  1 puff Inhalation Daily   Continuous Infusions: . sodium chloride 75 mL/hr at 07/28/19 0246     LOS: 8 days    Time spent: [place], MD [facility]  If 7PM-7AM, please contact night-coverage  07/28/2019, 12:05 PM

## 2019-07-29 LAB — URINALYSIS, ROUTINE W REFLEX MICROSCOPIC
Glucose, UA: NEGATIVE mg/dL
Ketones, ur: NEGATIVE mg/dL
Nitrite: NEGATIVE
Protein, ur: 100 mg/dL — AB
Specific Gravity, Urine: >1.046 — ABNORMAL HIGH (ref 1.005–1.030)
pH: 5 (ref 5.0–8.0)

## 2019-07-29 MED ORDER — SODIUM CHLORIDE 0.9 % IV SOLN
1.0000 g | INTRAVENOUS | Status: DC
  Administered 2019-07-30: 1 g via INTRAVENOUS
  Filled 2019-07-29 (×2): qty 10

## 2019-07-29 MED ORDER — SODIUM CHLORIDE 0.9 % IV SOLN
1.0000 g | Freq: Once | INTRAVENOUS | Status: AC
  Administered 2019-07-29: 1 g via INTRAVENOUS
  Filled 2019-07-29: qty 10

## 2019-07-29 NOTE — Progress Notes (Signed)
PROGRESS NOTE    [name] [name]  GUR:[id number] DOB: 1927-12-07 DOA: 07/19/2019 PCP: Burnis Medin, MD   Brief Narrative:  Patient is a 86 year old female with history of coronary disease status post stenting to RCA in 2005, peripheral vascular disease, COPD, CKD stage IIIa, hypertension, hyperlipidemia, carotid stenosis who presented to the emergency department for the evaluation of left-sided weakness and intermittent expressive aphasia. Initial CT head/MRI of the brain did not show any stroke. Later in the hospital, she developed right-sided weakness. Repeat MRI showed acute infarct in the left corona radiataand basal ganglia. Neurology was consulted and following. Stroke workup completed. Neurology recommended dual antiplatelet therapy with aspirin Plavix for 3 months followed by Plavix monotherapy because of severe left MCA stenosis. PT/OT recommending skilled nursing facility.Patient is medically stable for discharge as soon as bed is available   Assessment & Plan:   Principal Problem:   Acute ischemic left MCA stroke ([place]) Active Problems:   Hyperlipidemia   Essential hypertension, benign   Peripheral vascular disease (HCC)   COPD (chronic obstructive pulmonary disease) (HCC)   Left-sided weakness   Hypokalemia   CAD (coronary artery disease), native coronary artery   Aphasia   Cerebral thrombosis with cerebral infarction   Pressure injury of skin   Acute ischemic stroke: No changes, has left MCA subcortical infarct likely due to high-grade stenosis of left M1. MRI head initially did not show any acute brain findings. MRA head showed 6 mm short segment severe stenosis involving the mid distal left M1 segment. CT head and neck showed compensated severe left MCA M1 stenosis, severe left vertebral V4 segment stenosis, moderate to severe bilateral PCA stenosis. Also showed bilateral carotid bifurcation stenosis affecting ICA: 60% on the right and 55 to 60% on  the left. Last MRI showed multiple puncture the stroke at left basal ganglia and corona radiata. The stroke is most likely due to large vessel disease of high-grade left MCA stenosis. Neurology was consulted and was following. Recommended aspirin 325 mg and Plavix for 3 months followed by Plavix alone. She needs to follow-up with [facility] neurology as an outpatient. Stroke work-up completed. Hemoglobin A1c of 5.6. LDL of 120. Echocardiogram showed ejection fraction of 65%, no cardiac source of emboli. PT/OT recommending CIR versus skilled nursing facility. Speech therapy suspected dysphagia. Started on dysphagia 3 diet.  COPD exacerbation: Treated with Solu-Medrol and azithromycin. Chest x-ray did not show pneumonia or any other acute findings. Continue mucolytics. She is not on oxygen at home. Continue bronchodilators. Discussed with the granddaughter, as per her ,patient has chronic congestion and smoker's cough. However,herwheezes like auscultation findings are most likely upper airway related. Steroids changed to oral.She is on room air and doing well  Hypertension: Currently on Norvasc, Toprol, Imdur. She is still mildly hypertensive. Continue current medications and continue to monitor blood pressure.  Hyperlipidemia: LDL of 120. She has a statin intolerance. Continue Zetia.  Coronary artery disease:Status post stenting to the RCA. Denies any chest pain. Continue current medications.She has asymptomatic bradycardia.  CKD stage IIIa:Chronic and appears stable.Creatinine midly up today withhypernatremia which could beassociatedwith dehydration. Continue gentle half-normal saline . Check BMP tomorrow  Tobacco abuse: Currently smokes. Smoking cessation counseled. Patient is willing to quit.  Severe malnutrition: BMI of 17.16  DVT prophylaxis: Heparin SQ  Code Status: full    Code Status Orders  (From admission, onward)         Start     Ordered    07/19/19 2224  Full  code  Continuous        07/19/19 2230        Code Status History    Date Active Date Inactive Code Status Order ID Comments User Context   02/01/2017 1116 02/05/2017 1811 DNR [id number]  Desiree Hane, MD Inpatient   01/31/2017 2006 02/01/2017 1116 Full Code [id number]  Phillips Grout, MD ED   06/13/2016 1853 06/18/2016 1603 DNR [id number]  Elwin Mocha, MD Inpatient   06/13/2016 1604 06/13/2016 1853 Full Code [id number]  Elwin Mocha, MD ED   04/01/2016 2137 04/05/2016 2122 Full Code [id number]  Norval Morton, MD ED   11/12/2015 1720 11/18/2015 1826 Full Code [id number]  Shon Millet, DO Inpatient   Advance Care Planning Activity    Advance Directive Documentation     Most Recent Value  Type of Advance Directive Living will  Pre-existing out of facility DNR order (yellow form or pink MOST form) --  "MOST" Form in Place? --       Family Communication:Discussed with Onalee Hua on phone on 07/29/19 Status is: Inpatient  Remains inpatient appropriate because:Unsafe d/c plan   Dispo: The patient is from: Home Anticipated d/c is to:SNF Anticipated d/c date is: On the day of bed availability at a skilled nursing facility Medically stable for DC as soon as bed is available   Consultants:Neurology  Procedures:  EEG  Result Date: 07/20/2019 Lora Havens, MD     07/20/2019  3:39 PM Patient Name: [name] [name] MRN: [id number] Epilepsy Attending: Lora Havens Referring Physician/Provider: Dr. Kerney Elbe Date: 07/20/2019 Duration: 24.12 mins Patient history: 86 year old female presenting with transient spells of expressive aphasia. EEG evaluate for seizures. Level of alertness: Awake AEDs during EEG study: None Technical aspects: This EEG study was done with scalp electrodes positioned according to the 10-20 International system of electrode placement. Electrical activity was acquired at a sampling rate of 500Hz   and reviewed with a high frequency filter of 70Hz  and a low frequency filter of 1Hz . EEG data were recorded continuously and digitally stored. Description: The posterior dominant rhythm consists of 8 Hz activity of moderate voltage (25-35 uV) seen predominantly in posterior head regions, symmetric and reactive to eye opening and eye closing. EEG showed intermittent 2-3Hz  delta slowing in left temporal region.  Hyperventilation and photic stimulation were not performed.   ABNORMALITY -Intermittent slow, left temporal region IMPRESSION: This study is suggestive of non specific cortical dysfunction in left temporal region. No seizures or epileptiform discharges were seen throughout the recording. Lora Havens   CT Code Stroke CTA Head W/WO contrast  Result Date: 07/20/2019 CLINICAL DATA:  86 year old female with neurologic deficit. Unrevealing brain MRI yesterday, but MRA with severe left M1 stenosis. EXAM: CT ANGIOGRAPHY HEAD AND NECK CT PERFUSION BRAIN TECHNIQUE: Multidetector CT imaging of the head and neck was performed using the standard protocol during bolus administration of intravenous contrast. Multiplanar CT image reconstructions and MIPs were obtained to evaluate the vascular anatomy. Carotid stenosis measurements (when applicable) are obtained utilizing NASCET criteria, using the distal internal carotid diameter as the denominator. Multiphase CT imaging of the brain was performed following IV bolus contrast injection. Subsequent parametric perfusion maps were calculated using RAPID software. CONTRAST:  45mL OMNIPAQUE IOHEXOL 350 MG/ML SOLN COMPARISON:  Brain MRI and intracranial MRA 07/19/2019. FINDINGS: CT Brain Perfusion Findings: ASPECTS: 10 CBF (<30%) Volume: None Perfusion (Tmax>6.0s) volume: None, and no significant T-max asymmetry from the left to right hemisphere. Mismatch Volume: Not  applicable Infarction Location:Not applicable CT HEAD Brain: Calcified atherosclerosis at the skull base.  Stable cortical encephalomalacia in the left occipital pole. Patchy bilateral white matter hypodensity is stable. Dystrophic basal ganglia calcifications again noted. No midline shift, ventriculomegaly, mass effect, evidence of mass lesion, intracranial hemorrhage or evidence of cortically based acute infarction. Calvarium and skull base: Mild chronic right orbital floor fracture. No acute osseous abnormality identified. Paranasal sinuses: Sinuses and mastoids are largely clear today. Orbits: No acute orbit or scalp soft tissue finding. CTA NECK Skeleton: Advanced degenerative changes throughout the cervical spine. No acute osseous abnormality identified. Upper chest: Centrilobular emphysema. Mild apical lung scarring. No superior mediastinal lymphadenopathy. Sequelae of right axillary node dissection. Other neck: No acute findings. Aortic arch: Fairly bulky soft and calcified plaque throughout the aortic arch. Three vessel arch configuration. Right carotid system: Brachiocephalic artery and right CCA origin plaque without stenosis. Intermittent plaque in the right CCA without stenosis proximal to the bifurcation. Bulky calcified plaque at the carotid bifurcation and continuing into the proximal right ICA resulting in up to 60 % stenosis with respect to the distal vessel (series 7, image 103). Distal to the bulb no other right ICA plaque to the skull base. Left carotid system: Left CCA origin and other plaque without stenosis proximal to the bifurcation. Bulky calcified plaque at the left ICA origin and bulb resulting in 55-60 % stenosis with respect to the distal vessel (series 10, image 101). Mildly tortuous left ICA. Vertebral arteries: Proximal right subclavian plaque without stenosis. The right vertebral artery origin appear spared. Mild right V2 segment plaque, no right vertebral stenosis to the skull base. Proximal left subclavian artery soft and calcified plaque is extensive although less than 50 % stenosis  with respect to the distal vessel results. Mild stenosis at the left vertebral artery origin. Additional mild soft plaque in the left V1 segment. Patent and slightly non dominant left vertebral artery to the skull base without significant stenosis. CTA HEAD Posterior circulation: Bulky and circumferential calcified plaque of the left V4 segment with high-grade stenosis on series 9, image 145. This is concordant with the MRA yesterday. Mild right V4 calcified plaque without significant stenosis. The left vertebral and left PICA remain patent despite the high-grade stenosis. There is moderate stenosis again at the left vertebrobasilar junction. Patent basilar artery with mild irregularity and no significant stenosis. Patent SCA and PCA origins. Posterior communicating arteries are diminutive or absent. Severe left PCA stenosis from the P2 segment on word. Moderate to severe right P2 stenosis with better distal right PCA enhancement. This is concordant with the MRA. Anterior circulation: Both ICA siphons are patent. Calcified plaque on the left with only mild supraclinoid segment stenosis. Right side calcified plaque and tortuosity with mild supraclinoid stenosis. Patent bilateral carotid termini. Patent MCA and ACA origins. Anterior communicating artery, bilateral ACA branches, right MCA M1, and right MCA bifurcation are patent without stenosis. Mild to moderate right M2 and M3 branch irregularities and stenosis appear similar to the MRA. Left MCA M1 demonstrates a relatively long 6 mm segment of moderate to severe stenosis best seen on series 12, image 20. This leads up to the left MCA trifurcation which remains patent. Left MCA branches demonstrate fairly symmetric enhancement to those on the right, and only mild additional irregularity. Venous sinuses: Early contrast timing, not evaluated. Anatomic variants: Mildly dominant right vertebral artery. Review of the MIP images confirms the above findings IMPRESSION: 1.  Compensated Severe Left MCA M1 stenosis. CTP is  negative for infarct core or penumbra. And CT appearance of the brain is stable. 2. Other intracranial CTA findings concordant to the MRA yesterday including: - Severe Left Vertebral V4 segment stenosis. - Moderate to severe bilateral PCA stenosis worse on the left. 3. Extracranial calcified plaque resulting in bilateral carotid bifurcation stenosis affecting the ICAs: 60% on the Right and 55-60% on the Left . 4. No significant cervical vertebral artery stenosis. 5. Aortic Atherosclerosis (ICD10-I70.0) and Emphysema (ICD10-J43.9). Electronically Signed   By: Genevie Ann M.D.   On: 07/20/2019 04:20   DG Chest 1 View  Result Date: 07/25/2019 CLINICAL DATA:  86 year old female with shortness of breath. EXAM: CHEST  1 VIEW COMPARISON:  Chest radiograph dated 07/24/2019. FINDINGS: No focal consolidation, pleural effusion or pneumothorax. The cardiac silhouette is within limits. Atherosclerotic calcification of the aorta. No acute osseous pathology. Degenerative changes of the spine. IMPRESSION: No active cardiopulmonary disease. Electronically Signed   By: Anner Crete M.D.   On: 07/25/2019 21:35   DG Chest 2 View  Result Date: 07/17/2019 CLINICAL DATA:  Chest tightness since last evening, lower extremity numbness EXAM: CHEST - 2 VIEW COMPARISON:  11/23/2018 FINDINGS: Frontal and lateral views of the chest demonstrate a stable cardiac silhouette. Continued atherosclerosis of the thoracic aorta. Chronic elevation of the left hemidiaphragm. Background emphysema without airspace disease, effusion, or pneumothorax. No acute bony abnormalities. IMPRESSION: 1. Stable exam, no acute process. Electronically Signed   By: Randa Ngo M.D.   On: 07/17/2019 17:21   CT Code Stroke CTA Neck W/WO contrast  Result Date: 07/20/2019 CLINICAL DATA:  86 year old female with neurologic deficit. Unrevealing brain MRI yesterday, but MRA with severe left M1 stenosis. EXAM: CT  ANGIOGRAPHY HEAD AND NECK CT PERFUSION BRAIN TECHNIQUE: Multidetector CT imaging of the head and neck was performed using the standard protocol during bolus administration of intravenous contrast. Multiplanar CT image reconstructions and MIPs were obtained to evaluate the vascular anatomy. Carotid stenosis measurements (when applicable) are obtained utilizing NASCET criteria, using the distal internal carotid diameter as the denominator. Multiphase CT imaging of the brain was performed following IV bolus contrast injection. Subsequent parametric perfusion maps were calculated using RAPID software. CONTRAST:  67mL OMNIPAQUE IOHEXOL 350 MG/ML SOLN COMPARISON:  Brain MRI and intracranial MRA 07/19/2019. FINDINGS: CT Brain Perfusion Findings: ASPECTS: 10 CBF (<30%) Volume: None Perfusion (Tmax>6.0s) volume: None, and no significant T-max asymmetry from the left to right hemisphere. Mismatch Volume: Not applicable Infarction Location:Not applicable CT HEAD Brain: Calcified atherosclerosis at the skull base. Stable cortical encephalomalacia in the left occipital pole. Patchy bilateral white matter hypodensity is stable. Dystrophic basal ganglia calcifications again noted. No midline shift, ventriculomegaly, mass effect, evidence of mass lesion, intracranial hemorrhage or evidence of cortically based acute infarction. Calvarium and skull base: Mild chronic right orbital floor fracture. No acute osseous abnormality identified. Paranasal sinuses: Sinuses and mastoids are largely clear today. Orbits: No acute orbit or scalp soft tissue finding. CTA NECK Skeleton: Advanced degenerative changes throughout the cervical spine. No acute osseous abnormality identified. Upper chest: Centrilobular emphysema. Mild apical lung scarring. No superior mediastinal lymphadenopathy. Sequelae of right axillary node dissection. Other neck: No acute findings. Aortic arch: Fairly bulky soft and calcified plaque throughout the aortic arch. Three  vessel arch configuration. Right carotid system: Brachiocephalic artery and right CCA origin plaque without stenosis. Intermittent plaque in the right CCA without stenosis proximal to the bifurcation. Bulky calcified plaque at the carotid bifurcation and continuing into the proximal right ICA resulting  in up to 60 % stenosis with respect to the distal vessel (series 7, image 103). Distal to the bulb no other right ICA plaque to the skull base. Left carotid system: Left CCA origin and other plaque without stenosis proximal to the bifurcation. Bulky calcified plaque at the left ICA origin and bulb resulting in 55-60 % stenosis with respect to the distal vessel (series 10, image 101). Mildly tortuous left ICA. Vertebral arteries: Proximal right subclavian plaque without stenosis. The right vertebral artery origin appear spared. Mild right V2 segment plaque, no right vertebral stenosis to the skull base. Proximal left subclavian artery soft and calcified plaque is extensive although less than 50 % stenosis with respect to the distal vessel results. Mild stenosis at the left vertebral artery origin. Additional mild soft plaque in the left V1 segment. Patent and slightly non dominant left vertebral artery to the skull base without significant stenosis. CTA HEAD Posterior circulation: Bulky and circumferential calcified plaque of the left V4 segment with high-grade stenosis on series 9, image 145. This is concordant with the MRA yesterday. Mild right V4 calcified plaque without significant stenosis. The left vertebral and left PICA remain patent despite the high-grade stenosis. There is moderate stenosis again at the left vertebrobasilar junction. Patent basilar artery with mild irregularity and no significant stenosis. Patent SCA and PCA origins. Posterior communicating arteries are diminutive or absent. Severe left PCA stenosis from the P2 segment on word. Moderate to severe right P2 stenosis with better distal right PCA  enhancement. This is concordant with the MRA. Anterior circulation: Both ICA siphons are patent. Calcified plaque on the left with only mild supraclinoid segment stenosis. Right side calcified plaque and tortuosity with mild supraclinoid stenosis. Patent bilateral carotid termini. Patent MCA and ACA origins. Anterior communicating artery, bilateral ACA branches, right MCA M1, and right MCA bifurcation are patent without stenosis. Mild to moderate right M2 and M3 branch irregularities and stenosis appear similar to the MRA. Left MCA M1 demonstrates a relatively long 6 mm segment of moderate to severe stenosis best seen on series 12, image 20. This leads up to the left MCA trifurcation which remains patent. Left MCA branches demonstrate fairly symmetric enhancement to those on the right, and only mild additional irregularity. Venous sinuses: Early contrast timing, not evaluated. Anatomic variants: Mildly dominant right vertebral artery. Review of the MIP images confirms the above findings IMPRESSION: 1. Compensated Severe Left MCA M1 stenosis. CTP is negative for infarct core or penumbra. And CT appearance of the brain is stable. 2. Other intracranial CTA findings concordant to the MRA yesterday including: - Severe Left Vertebral V4 segment stenosis. - Moderate to severe bilateral PCA stenosis worse on the left. 3. Extracranial calcified plaque resulting in bilateral carotid bifurcation stenosis affecting the ICAs: 60% on the Right and 55-60% on the Left . 4. No significant cervical vertebral artery stenosis. 5. Aortic Atherosclerosis (ICD10-I70.0) and Emphysema (ICD10-J43.9). Electronically Signed   By: Genevie Ann M.D.   On: 07/20/2019 04:20   MR ANGIO HEAD WO CONTRAST  Result Date: 07/20/2019 CLINICAL DATA:  Follow-up examination for acute stroke. EXAM: MRA HEAD WITHOUT CONTRAST TECHNIQUE: Angiographic images of the Circle of Willis were obtained using MRA technique without intravenous contrast. COMPARISON:   Comparison made with prior brain MRI from earlier the same day. FINDINGS: ANTERIOR CIRCULATION: Examination mildly degraded by motion artifact. Distal cervical segments of both internal carotid arteries are widely patent with symmetric antegrade flow. Petrous, cavernous, and supraclinoid ICAs widely patent without stenosis  or other abnormality. A1 segments patent. Normal anterior communicating artery complex. Anterior cerebral arteries patent to their distal aspects without stenosis. Right M1 widely patent. Normal right MCA bifurcation. Distal right MCA branches well perfused. Focal severe stenosis seen involving the mid-distal left M1 segment (series 1045, image 13). Stenosis measures approximately 6 mm in length. Left MCA bifurcation patent distally. Distal left MCA branches are perfused, although mildly attenuated as compared to the contralateral right MCA branches. POSTERIOR CIRCULATION: Right vertebral artery dominant and widely patent to the vertebrobasilar junction. Left vertebral artery not seen as it courses into the cranial vault, and may be partially occluded. Flow related signal is seen within the distal left V4 segment with filling of the left PICA, which could be retrograde in nature across the vertebrobasilar junction. Right PICA not seen. Basilar widely patent to its distal aspect without stenosis. Superior cerebral arteries patent bilaterally. Both PCAs primarily supplied via the basilar. Atheromatous change with associated moderate bilateral P2 stenoses noted. PCAs remain patent to their distal aspects. No intracranial aneurysm. IMPRESSION: 1. 6 mm short segment severe stenosis involving the mid-distal left M1 segment. Distal left MCA branches are perfused, although attenuated as compared to the contralateral right MCA branches. 2. Nonvisualization of the left vertebral artery as it courses into the cranial vault, which may be partially occluded. Retrograde filling of the left V4 segment and left  PICA across the vertebrobasilar junction. 3. Atheromatous change throughout the PCAs bilaterally with associated moderate bilateral P2 stenoses. Electronically Signed   By: Jeannine Boga M.D.   On: 07/20/2019 02:00   MR BRAIN WO CONTRAST  Result Date: 07/20/2019 CLINICAL DATA:  Stroke follow-up. Right facial droop and mild right hemiparesis. EXAM: MRI HEAD WITHOUT CONTRAST TECHNIQUE: Multiplanar, multiecho pulse sequences of the brain and surrounding structures were obtained without intravenous contrast. COMPARISON:  Head CT, CTA, and CTP 07/20/2019 and MRI 07/19/2019 FINDINGS: At the request of the ordering neurologist, only axial and coronal diffusion weighted imaging was obtained. There are new punctate acute infarcts involving the left corona radiata and superior left lentiform nucleus. No intracranial mass effect or extra-axial fluid collection is identified. Assessment of chronic findings including chronic small vessel ischemia in the cerebral white matter is deferred to yesterday's complete brain MRI. IMPRESSION: Punctate acute infarcts in the left corona radiata and basal ganglia. Electronically Signed   By: Logan Bores M.D.   On: 07/20/2019 20:49   MR BRAIN WO CONTRAST  Result Date: 07/19/2019 CLINICAL DATA:  Ataxia. EXAM: MRI HEAD WITHOUT CONTRAST TECHNIQUE: Multiplanar, multiecho pulse sequences of the brain and surrounding structures were obtained without intravenous contrast. COMPARISON:  Head CT 06/08/2016 FINDINGS: Brain: Diffusion imaging does not show any acute or subacute infarction. There chronic small-vessel ischemic changes of the pons. No focal cerebellar stroke. There is an old left occipital cortical and subcortical infarction. There are chronic small-vessel ischemic changes of the cerebral hemispheric white matter. Old small vessel infarction in the right thalamus. No sign of mass lesion, hemorrhage, hydrocephalus or extra-axial collection. Vascular: Major vessels at the base  of the brain show flow. Skull and upper cervical spine: 2 foci of signal within the posterior parietal calvarium towards the vertex, most consistent with hemangiomas. Sinuses/Orbits: Clear/normal Other: None IMPRESSION: No acute brain finding. No abnormality seen to explain acute ataxia. Chronic small-vessel change of the pons and hemispheric white matter. 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 Left mastoid effusion. Electronically Signed   By: Nelson Chimes M.D.   On: 07/19/2019  18:52   CT Code Stroke Cerebral Perfusion with contrast  Result Date: 07/20/2019 CLINICAL DATA:  86 year old female with neurologic deficit. Unrevealing brain MRI yesterday, but MRA with severe left M1 stenosis. EXAM: CT ANGIOGRAPHY HEAD AND NECK CT PERFUSION BRAIN TECHNIQUE: Multidetector CT imaging of the head and neck was performed using the standard protocol during bolus administration of intravenous contrast. Multiplanar CT image reconstructions and MIPs were obtained to evaluate the vascular anatomy. Carotid stenosis measurements (when applicable) are obtained utilizing NASCET criteria, using the distal internal carotid diameter as the denominator. Multiphase CT imaging of the brain was performed following IV bolus contrast injection. Subsequent parametric perfusion maps were calculated using RAPID software. CONTRAST:  18mL OMNIPAQUE IOHEXOL 350 MG/ML SOLN COMPARISON:  Brain MRI and intracranial MRA 07/19/2019. FINDINGS: CT Brain Perfusion Findings: ASPECTS: 10 CBF (<30%) Volume: None Perfusion (Tmax>6.0s) volume: None, and no significant T-max asymmetry from the left to right hemisphere. Mismatch Volume: Not applicable Infarction Location:Not applicable CT HEAD Brain: Calcified atherosclerosis at the skull base. Stable cortical encephalomalacia in the left occipital pole. Patchy bilateral white matter hypodensity is stable. Dystrophic basal ganglia calcifications again noted. No midline shift, ventriculomegaly,  mass effect, evidence of mass lesion, intracranial hemorrhage or evidence of cortically based acute infarction. Calvarium and skull base: Mild chronic right orbital floor fracture. No acute osseous abnormality identified. Paranasal sinuses: Sinuses and mastoids are largely clear today. Orbits: No acute orbit or scalp soft tissue finding. CTA NECK Skeleton: Advanced degenerative changes throughout the cervical spine. No acute osseous abnormality identified. Upper chest: Centrilobular emphysema. Mild apical lung scarring. No superior mediastinal lymphadenopathy. Sequelae of right axillary node dissection. Other neck: No acute findings. Aortic arch: Fairly bulky soft and calcified plaque throughout the aortic arch. Three vessel arch configuration. Right carotid system: Brachiocephalic artery and right CCA origin plaque without stenosis. Intermittent plaque in the right CCA without stenosis proximal to the bifurcation. Bulky calcified plaque at the carotid bifurcation and continuing into the proximal right ICA resulting in up to 60 % stenosis with respect to the distal vessel (series 7, image 103). Distal to the bulb no other right ICA plaque to the skull base. Left carotid system: Left CCA origin and other plaque without stenosis proximal to the bifurcation. Bulky calcified plaque at the left ICA origin and bulb resulting in 55-60 % stenosis with respect to the distal vessel (series 10, image 101). Mildly tortuous left ICA. Vertebral arteries: Proximal right subclavian plaque without stenosis. The right vertebral artery origin appear spared. Mild right V2 segment plaque, no right vertebral stenosis to the skull base. Proximal left subclavian artery soft and calcified plaque is extensive although less than 50 % stenosis with respect to the distal vessel results. Mild stenosis at the left vertebral artery origin. Additional mild soft plaque in the left V1 segment. Patent and slightly non dominant left vertebral artery to  the skull base without significant stenosis. CTA HEAD Posterior circulation: Bulky and circumferential calcified plaque of the left V4 segment with high-grade stenosis on series 9, image 145. This is concordant with the MRA yesterday. Mild right V4 calcified plaque without significant stenosis. The left vertebral and left PICA remain patent despite the high-grade stenosis. There is moderate stenosis again at the left vertebrobasilar junction. Patent basilar artery with mild irregularity and no significant stenosis. Patent SCA and PCA origins. Posterior communicating arteries are diminutive or absent. Severe left PCA stenosis from the P2 segment on word. Moderate to severe right P2 stenosis with better distal right  PCA enhancement. This is concordant with the MRA. Anterior circulation: Both ICA siphons are patent. Calcified plaque on the left with only mild supraclinoid segment stenosis. Right side calcified plaque and tortuosity with mild supraclinoid stenosis. Patent bilateral carotid termini. Patent MCA and ACA origins. Anterior communicating artery, bilateral ACA branches, right MCA M1, and right MCA bifurcation are patent without stenosis. Mild to moderate right M2 and M3 branch irregularities and stenosis appear similar to the MRA. Left MCA M1 demonstrates a relatively long 6 mm segment of moderate to severe stenosis best seen on series 12, image 20. This leads up to the left MCA trifurcation which remains patent. Left MCA branches demonstrate fairly symmetric enhancement to those on the right, and only mild additional irregularity. Venous sinuses: Early contrast timing, not evaluated. Anatomic variants: Mildly dominant right vertebral artery. Review of the MIP images confirms the above findings IMPRESSION: 1. Compensated Severe Left MCA M1 stenosis. CTP is negative for infarct core or penumbra. And CT appearance of the brain is stable. 2. Other intracranial CTA findings concordant to the MRA yesterday  including: - Severe Left Vertebral V4 segment stenosis. - Moderate to severe bilateral PCA stenosis worse on the left. 3. Extracranial calcified plaque resulting in bilateral carotid bifurcation stenosis affecting the ICAs: 60% on the Right and 55-60% on the Left . 4. No significant cervical vertebral artery stenosis. 5. Aortic Atherosclerosis (ICD10-I70.0) and Emphysema (ICD10-J43.9). Electronically Signed   By: Genevie Ann M.D.   On: 07/20/2019 04:20   DG CHEST PORT 1 VIEW  Result Date: 07/24/2019 CLINICAL DATA:  Shortness of breath. EXAM: PORTABLE CHEST 1 VIEW COMPARISON:  July 21, 2019. FINDINGS: The heart size and mediastinal contours are within normal limits. No pneumothorax or pleural effusion is noted. Atherosclerosis of thoracic aorta is noted. Both lungs are clear. The visualized skeletal structures are unremarkable. IMPRESSION: No active disease. Aortic Atherosclerosis (ICD10-I70.0). Electronically Signed   By: Marijo Conception M.D.   On: 07/24/2019 14:48   DG CHEST PORT 1 VIEW  Result Date: 07/21/2019 CLINICAL DATA:  86 year old female with bilateral crackle. EXAM: PORTABLE CHEST 1 VIEW COMPARISON:  Chest radiograph dated 07/21/2019. FINDINGS: Probable trace left pleural effusion or pleural thickening with minimal left lung base atelectasis. No focal consolidation, or pneumothorax. The cardiac silhouette is within limits. Atherosclerotic calcification of the aorta. Probable old healed left lateral rib fracture. No acute osseous pathology. Degenerative changes of the spine. Right axillary surgical clips. IMPRESSION: Probable trace left pleural effusion with minimal left lung base atelectasis. Electronically Signed   By: Anner Crete M.D.   On: 07/21/2019 21:26   DG CHEST PORT 1 VIEW  Result Date: 07/21/2019 CLINICAL DATA:  Cough. EXAM: PORTABLE CHEST 1 VIEW COMPARISON:  July 20, 2019 FINDINGS: The heart, hila, mediastinum, lungs, and pleura are unchanged and unremarkable. IMPRESSION: No active  disease. Electronically Signed   By: Dorise Bullion III M.D   On: 07/21/2019 11:21   DG CHEST PORT 1 VIEW  Result Date: 07/20/2019 CLINICAL DATA:  Hypothermia EXAM: PORTABLE CHEST 1 VIEW COMPARISON:  07/19/2019 FINDINGS: There is hyperinflation of the lungs compatible with COPD. Heart is normal size. Aortic atherosclerosis. No confluent opacities or effusions. No acute bony abnormality. IMPRESSION: COPD.  No active disease. Electronically Signed   By: Rolm Baptise M.D.   On: 07/20/2019 02:27   DG Chest Port 1 View  Result Date: 07/19/2019 CLINICAL DATA:  Rales EXAM: PORTABLE CHEST 1 VIEW COMPARISON:  07/17/2019 FINDINGS: Single frontal view of the  chest was performed, excluding the right costophrenic angle by collimation. The cardiac silhouette is unremarkable. Stable atherosclerosis of the aortic arch. No airspace disease, effusion, or pneumothorax. No acute bony abnormalities. IMPRESSION: 1. Stable exam, no acute process. Electronically Signed   By: Randa Ngo M.D.   On: 07/19/2019 18:31   DG Swallowing Func-Speech Pathology  Result Date: 07/24/2019 Objective Swallowing Evaluation: Type of Study: MBS-Modified Barium Swallow Study  Patient Details Name: [name] [name] MRN: [id number] Date of Birth: [birth year] Today's Date: 07/24/2019 Time: SLP Start Time (ACUTE ONLY): 1215 -SLP Stop Time (ACUTE ONLY): 1245 SLP Time Calculation (min) (ACUTE ONLY): 30 min Past Medical History: Past Medical History: Diagnosis Date . CARCINOMA, BASAL CELL 02/13/2006 . CAROTID ARTERY DISEASE 10/03/2006  Carotid US 11/17: Stable 40-59% bilateral ICA stenosis >> f/u 1 year . CHRONIC OBSTRUCTIVE PULMONARY DISEASE, ACUTE EXACERBATION 11/17/2009 . Chronic rhinitis 12/13/2006 . CORONARY ARTERY DISEASE 02/13/2006 . DEGENERATIVE JOINT DISEASE 09/27/2006 . DERMATITIS 09/25/2007 . Head trauma   closed . HYPERLIPIDEMIA 03/10/2008 . HYPERTENSION 02/13/2006 . HYPOTHYROIDISM 01/30/2007 . LIVER FUNCTION TESTS, ABNORMAL 01/22/2009 .  LOSS, HEARING NOS 08/25/2006 . Near syncope 09/18/2014 . NEOPLASM, SKIN, UNCERTAIN BEHAVIOR 09/98/3382 . NEPHROLITHIASIS, HX OF 02/27/2010 . OSTEOARTHRITIS, HAND 12/16/2008 . PERIPHERAL VASCULAR DISEASE 02/13/2006 . Personal history of malignant neoplasm of breast 07/24/2008 . Personal history of urinary calculi  . Post-traumatic wound infection 12/01/2010  Mild streaking superiorly and on one day of septra  Will give rocephin and add keflex  adn follow up  . POSTHERPETIC NEURALGIA 08/25/2006 . RENAL ARTERY STENOSIS 08/01/2008 . Shingles   recurrent . SPINAL STENOSIS 11/01/2006 . THROMBOCYTOPENIA 07/30/2008 . UNS ADVRS EFF UNS RX MEDICINAL&BIOLOGICAL SBSTNC 01/30/2007 . UNSPECIFIED ANEMIA 03/18/2008 . Unspecified vitamin D deficiency 02/07/2007 . UNSTABLE ANGINA 12/12/2009 . Vitreous detachment  Past Surgical History: Past Surgical History: Procedure Laterality Date . ABDOMINAL HYSTERECTOMY   . BREAST LUMPECTOMY   . BREAST LUMPECTOMY  1998 . CATARACT EXTRACTION   . CORONARY ANGIOPLASTY WITH STENT PLACEMENT    bilat iliac stents, left renal artery and rt coronary artery last myoview 8/09 with EF 70% . LUMBAR LAMINECTOMY   . SKIN BIOPSY  08/24/2017  Invasive squamous cell carcinoma . SKIN CANCER EXCISION   . TONSILLECTOMY   HPI: [name] [name] is a 86 y.o. female who presents to the ED for evaluation of left-sided weakness and intermittent expressive aphasia.  MRI showed multiple punctate strokes at left BG and corona radiata. Initial bedside swallow eval was completed 6/18. Patient reported to have worsening of right sided weakness-->now flaccid with significant deficits.  Subjective: alert Assessment / Plan / Recommendation CHL IP CLINICAL IMPRESSIONS 07/24/2019 Clinical Impression Pt presents with moderate oropharyngeal dysphagia with intermittent aspiration of thin liquids before/during the swallow response - aspiration was not consistently accompanied by a cough response - it was occasionally silent in nature.  There  was prolonged and decreased coordination of the oral phase of the swallow, with spillage from right side of oral cavity and premature spillage over base of tongue.  Aspiration was generally due to a problem with timing of laryngeal vestibule closure.  Nectar-thick liquids allowed for improved timing and no observed penetration/aspiration.  Pt had difficulty following commands for postural/strategic adjustments.  Anatomy was remarkable for degenerative changes in the cervical spine - this did not impact function. Calcified plaque in the arteries was also notable.  Recommend changing diet to dysphagia 3, nectar-thick liquids. Treatment should focus on improving coordination/sensory reception within oral cavity as well as  timing of pharyngeal response.   SLP Visit Diagnosis Dysphagia, oropharyngeal phase (R13.12) Attention and concentration deficit following -- Frontal lobe and executive function deficit following -- Impact on safety and function Moderate aspiration risk   CH[facility] IP TREATMENT RECOMMENDATION 07/24/2019 Treatment Recommendations Therapy as outlined in treatment plan below   Prognosis 07/20/2019 Prognosis for Safe Diet Advancement Good Barriers to Reach Goals -- Barriers/Prognosis Comment -- [facility] IP DIET RECOMMENDATION 07/24/2019 SLP Diet Recommendations Dysphagia 3 (Mech soft) solids;Nectar thick liquid Liquid Administration via Cup Medication Administration Crushed with puree Compensations Slow rate;Small sips/bites Postural Changes Remain semi-upright after after feeds/meals (Comment)   [facility] IP OTHER RECOMMENDATIONS 07/24/2019 Recommended Consults -- Oral Care Recommendations Oral care BID Other Recommendations Order thickener from pharmacy   [facility] IP FOLLOW UP RECOMMENDATIONS 07/24/2019 Follow up Recommendations Skilled Nursing facility   [facility] IP FREQUENCY AND DURATION 07/24/2019 Speech Therapy Frequency (ACUTE ONLY) min 2x/week Treatment Duration 1 week      CHL IP ORAL PHASE 07/24/2019 Oral Phase Impaired Oral -  Pudding Teaspoon -- Oral - Pudding Cup -- Oral - Honey Teaspoon -- Oral - Honey Cup -- Oral - Nectar Teaspoon -- Oral - Nectar Cup Right anterior bolus loss;Weak lingual manipulation;Right pocketing in lateral sulci;Delayed oral transit;Decreased bolus cohesion Oral - Nectar Straw -- Oral - Thin Teaspoon Right anterior bolus loss;Weak lingual manipulation;Right pocketing in lateral sulci;Delayed oral transit;Decreased bolus cohesion Oral - Thin Cup Right anterior bolus loss;Weak lingual manipulation;Right pocketing in lateral sulci;Delayed oral transit;Decreased bolus cohesion Oral - Thin Straw Right anterior bolus loss;Weak lingual manipulation;Right pocketing in lateral sulci;Delayed oral transit;Decreased bolus cohesion Oral - Puree Weak lingual manipulation;Right pocketing in lateral sulci;Delayed oral transit;Decreased bolus cohesion Oral - Mech Soft -- Oral - Regular Weak lingual manipulation;Right pocketing in lateral sulci;Delayed oral transit;Decreased bolus cohesion Oral - Multi-Consistency -- Oral - Pill -- Oral Phase - Comment --  CHL IP PHARYNGEAL PHASE 07/24/2019 Pharyngeal Phase Impaired Pharyngeal- Pudding Teaspoon -- Pharyngeal -- Pharyngeal- Pudding Cup -- Pharyngeal -- Pharyngeal- Honey Teaspoon -- Pharyngeal -- Pharyngeal- Honey Cup -- Pharyngeal -- Pharyngeal- Nectar Teaspoon -- Pharyngeal -- Pharyngeal- Nectar Cup Delayed swallow initiation-vallecula;Delayed swallow initiation-pyriform sinuses;Reduced tongue base retraction Pharyngeal -- Pharyngeal- Nectar Straw -- Pharyngeal -- Pharyngeal- Thin Teaspoon Delayed swallow initiation-pyriform sinuses Pharyngeal -- Pharyngeal- Thin Cup Delayed swallow initiation-pyriform sinuses;Reduced airway/laryngeal closure;Penetration/Aspiration before swallow;Penetration/Aspiration during swallow;Trace aspiration;Pharyngeal residue - valleculae Pharyngeal Material enters airway, passes BELOW cords and not ejected out despite cough attempt by patient;Material  enters airway, passes BELOW cords without attempt by patient to eject out (silent aspiration) Pharyngeal- Thin Straw Delayed swallow initiation-pyriform sinuses;Reduced airway/laryngeal closure;Penetration/Aspiration before swallow;Penetration/Aspiration during swallow;Trace aspiration;Pharyngeal residue - valleculae Pharyngeal Material enters airway, passes BELOW cords and not ejected out despite cough attempt by patient;Material enters airway, passes BELOW cords without attempt by patient to eject out (silent aspiration) Pharyngeal- Puree Delayed swallow initiation-vallecula;Pharyngeal residue - valleculae Pharyngeal -- Pharyngeal- Mechanical Soft -- Pharyngeal -- Pharyngeal- Regular Delayed swallow initiation-vallecula;Pharyngeal residue - valleculae Pharyngeal -- Pharyngeal- Multi-consistency -- Pharyngeal -- Pharyngeal- Pill -- Pharyngeal -- Pharyngeal Comment --  CHL IP CERVICAL ESOPHAGEAL PHASE 02/04/2017 Cervical Esophageal Phase (No Data) Pudding Teaspoon -- Pudding Cup -- Honey Teaspoon -- Honey Cup -- Nectar Teaspoon -- Nectar Cup -- Nectar Straw -- Thin Teaspoon -- Thin Cup -- Thin Straw -- Puree -- Mechanical Soft -- Regular -- Multi-consistency -- Pill -- Cervical Esophageal Comment -- Juan Quam Laurice 07/24/2019, 1:28 PM  ECHOCARDIOGRAM COMPLETE  Result Date: 07/20/2019    ECHOCARDIOGRAM REPORT   Patient Name:   [name] [name] Date of Exam: 07/20/2019 Medical Rec #:  [id number]             Height:       64.0 in Accession #:    [id number]            Weight:       100.0 lb Date of Birth:  Jan 19, 1928            BSA:          1.457 m Patient Age:    26 years              BP:           155/63 mmHg Patient Gender: F                     HR:           62 bpm. Exam Location:  Inpatient Procedure: 2D Echo, Cardiac Doppler and Color Doppler Indications:    TIA 435.9 / G45.9  History:        Patient has prior history of Echocardiogram examinations, most                 recent 11/16/2015.  CAD, Carotid Disease; Risk                 Factors:Hypertension and Dyslipidemia. Hypothyroidism.  Sonographer:    Jonelle Sidle Dance Referring Phys: [id number] Hodges  1. Left ventricular ejection fraction, by estimation, is 60 to 65%. The left ventricle has normal function. The left ventricle has no regional wall motion abnormalities. Left ventricular diastolic parameters are consistent with Grade I diastolic dysfunction (impaired relaxation). Elevated left atrial pressure.  2. Right ventricular systolic function is normal. The right ventricular size is normal. There is normal pulmonary artery systolic pressure. The estimated right ventricular systolic pressure is 62.7 mmHg.  3. The mitral valve is normal in structure. Mild mitral valve regurgitation. No evidence of mitral stenosis.  4. Tricuspid valve regurgitation is moderate.  5. The aortic valve is normal in structure. Aortic valve regurgitation is trivial. Mild aortic valve sclerosis is present, with no evidence of aortic valve stenosis.  6. The inferior vena cava is normal in size with greater than 50% respiratory variability, suggesting right atrial pressure of 3 mmHg. FINDINGS  Left Ventricle: Left ventricular ejection fraction, by estimation, is 60 to 65%. The left ventricle has normal function. The left ventricle has no regional wall motion abnormalities. The left ventricular internal cavity size was normal in size. There is  no left ventricular hypertrophy. Left ventricular diastolic parameters are consistent with Grade I diastolic dysfunction (impaired relaxation). Elevated left atrial pressure. Right Ventricle: The right ventricular size is normal. No increase in right ventricular wall thickness. Right ventricular systolic function is normal. There is normal pulmonary artery systolic pressure. The tricuspid regurgitant velocity is 2.30 m/s, and  with an assumed right atrial pressure of 8 mmHg, the estimated right ventricular systolic  pressure is 03.5 mmHg. Left Atrium: Left atrial size was normal in size. Right Atrium: Right atrial size was normal in size. Pericardium: There is no evidence of pericardial effusion. Mitral Valve: The mitral valve is normal in structure. Normal mobility of the mitral valve leaflets. Mild mitral valve regurgitation. No evidence of mitral valve stenosis. Tricuspid Valve: The tricuspid valve is normal in structure. Tricuspid valve regurgitation is  moderate . No evidence of tricuspid stenosis. Aortic Valve: The aortic valve is normal in structure. Aortic valve regurgitation is trivial. Aortic regurgitation PHT measures 503 msec. Mild aortic valve sclerosis is present, with no evidence of aortic valve stenosis. Pulmonic Valve: The pulmonic valve was normal in structure. Pulmonic valve regurgitation is not visualized. No evidence of pulmonic stenosis. Aorta: The aortic root is normal in size and structure. Venous: The inferior vena cava is normal in size with greater than 50% respiratory variability, suggesting right atrial pressure of 3 mmHg. IAS/Shunts: No atrial level shunt detected by color flow Doppler.  LEFT VENTRICLE PLAX 2D LVIDd:         3.90 cm  Diastology LVIDs:         2.70 cm  LV e' lateral:   4.40 cm/s LV PW:         0.90 cm  LV E/e' lateral: 12.4 LV IVS:        0.80 cm  LV e' medial:    3.50 cm/s LVOT diam:     2.20 cm  LV E/e' medial:  15.5 LV SV:         67 LV SV Index:   46 LVOT Area:     3.80 cm  RIGHT VENTRICLE             IVC RV Basal diam:  3.40 cm     IVC diam: 2.20 cm RV Mid diam:    2.30 cm RV S prime:     10.90 cm/s TAPSE (M-mode): 2.5 cm LEFT ATRIUM             Index       RIGHT ATRIUM           Index LA diam:        3.10 cm 2.13 cm/m  RA Area:     15.40 cm LA Vol (A2C):   19.7 ml 13.52 ml/m RA Volume:   37.60 ml  25.80 ml/m LA Vol (A4C):   23.3 ml 15.99 ml/m LA Biplane Vol: 21.6 ml 14.82 ml/m  AORTIC VALVE LVOT Vmax:   66.60 cm/s LVOT Vmean:  43.000 cm/s LVOT VTI:    0.176 m AI PHT:       503 msec  AORTA Ao Root diam: 2.80 cm Ao Asc diam:  3.50 cm MITRAL VALVE               TRICUSPID VALVE MV Area (PHT): 2.22 cm    TR Peak grad:   21.2 mmHg MV Decel Time: 342 msec    TR Vmax:        230.00 cm/s MV E velocity: 54.40 cm/s MV A velocity: 75.80 cm/s  SHUNTS MV E/A ratio:  0.72        Systemic VTI:  0.18 m                            Systemic Diam: 2.20 cm Dani Gobble Croitoru MD Electronically signed by Sanda Klein MD Signature Date/Time: 07/20/2019/10:39:24 AM    Final    VAS US CAROTID (at [facility] and [facility] only)  Result Date: 07/20/2019 Carotid Arterial Duplex Study Indications:       TIA and Carotid artery disease. Risk Factors:      Hypertension, hyperlipidemia. Comparison Study:  03/22/17 bilateral 40-59% Performing Technologist: June Leap RDMS, RVT  Examination Guidelines: A complete evaluation includes B-mode imaging, spectral Doppler, color Doppler, and power Doppler as needed of  all accessible portions of each vessel. Bilateral testing is considered an integral part of a complete examination. Limited examinations for reoccurring indications may be performed as noted.  Right Carotid Findings: +----------+--------+--------+--------+--------------------------+--------+           PSV cm/sEDV cm/sStenosisPlaque Description        Comments +----------+--------+--------+--------+--------------------------+--------+ CCA Prox  57      6                                                  +----------+--------+--------+--------+--------------------------+--------+ CCA Distal73      11                                                 +----------+--------+--------+--------+--------------------------+--------+ ICA Prox  166     33      40-59%  heterogenous and irregular         +----------+--------+--------+--------+--------------------------+--------+ ICA Distal66      19                                                  +----------+--------+--------+--------+--------------------------+--------+ ECA       152     12                                                 +----------+--------+--------+--------+--------------------------+--------+ +----------+--------+-------+----------------+-------------------+           PSV cm/sEDV cmsDescribe        Arm Pressure (mmHG) +----------+--------+-------+----------------+-------------------+ [id number]             Multiphasic, WNL                    +----------+--------+-------+----------------+-------------------+ +---------+--------+--+--------+--+---------+ VertebralPSV cm/s41EDV cm/s11Antegrade +---------+--------+--+--------+--+---------+  Left Carotid Findings: +----------+--------+--------+--------+--------------------+-------------------+           PSV cm/sEDV cm/sStenosisPlaque Description  Comments            +----------+--------+--------+--------+--------------------+-------------------+ CCA Prox  64      9                                                       +----------+--------+--------+--------+--------------------+-------------------+ CCA Distal54      15                                                      +----------+--------+--------+--------+--------------------+-------------------+ ICA Prox  115     23      1-39%   calcific and        acoustic shadowing  irregular           may obscure higher                                                        velocities          +----------+--------+--------+--------+--------------------+-------------------+ ICA Distal96      13                                                      +----------+--------+--------+--------+--------------------+-------------------+ ECA       61      3                                                       +----------+--------+--------+--------+--------------------+-------------------+  +----------+--------+--------+--------+-------------------+           PSV cm/sEDV cm/sDescribeArm Pressure (mmHG) +----------+--------+--------+--------+-------------------+ [id number]             Stenotic                    +----------+--------+--------+--------+-------------------+ +---------+--------+--+--------+--+---------+ VertebralPSV cm/s97EDV cm/s12Antegrade +---------+--------+--+--------+--+---------+   Summary: Right Carotid: Velocities in the right ICA are consistent with a 40-59%                stenosis. Left Carotid: Velocities in the left ICA are consistent with a 1-39% stenosis. Subclavians: Left subclavian artery was stenotic. *See table(s) above for measurements and observations.  Electronically signed by Antony Contras MD on 07/20/2019 at 10:57:45 AM.    Final      Antimicrobials:   NONE    Subjective: No acute changes overnight, resting in bed, somewhat sleepy  Objective: Vitals:   07/29/19 0600 07/29/19 0712 07/29/19 0723 07/29/19 1121  BP:   (!) 159/53 (!) 128/54  Pulse:   (!) 57 (!) 59  Resp: 18  18 18   Temp:   97.6 F (36.4 C) 97.8 F (36.6 C)  TempSrc:   Oral Oral  SpO2:  93% 97% 94%  Weight:      Height:        Intake/Output Summary (Last 24 hours) at 07/29/2019 1212 Last data filed at 07/29/2019 0900 Gross per 24 hour  Intake 1908.82 ml  Output 250 ml  Net 1658.82 ml   Filed Weights   07/19/19 1743  Weight: 45.4 kg    Examination:  General exam: Appears calm and comfortable  Respiratory system: Clear to auscultation. Respiratory effort normal. Cardiovascular system: S1 & S2 heard, RRR. No JVD, murmurs, rubs, gallops or clicks. No pedal edema. Gastrointestinal system: Abdomen is nondistended, soft and nontender. No organomegaly or masses felt. Normal bowel sounds heard. Central nervous system: Alert, DYSARHTIC, ASYM UE STENGTH Extremities: wwp, trace edema Skin: No rashes, lesions or ulcers Psychiatry: FLAT AFFECT      Data Reviewed: I have personally reviewed following labs and imaging studies  CBC: No results for input(s): WBC, NEUTROABS, HGB, HCT, MCV, PLT in the last 168 hours. Basic Metabolic Panel: Recent Labs  Lab 07/25/19 1949 07/26/19 0908 07/27/19 2353  07/28/19 0327  NA 145 148* 148* 149*  K 3.4* 3.3* 3.7 3.9  CL 111 113* 112* 115*  CO2 21* 22 23 21*  GLUCOSE 180* 102* 129* 106*  BUN 66* 58* 57* 61*  CREATININE 1.44* 1.21* 1.25* 1.29*  CALCIUM 9.5 9.5 9.7 9.5  MG 2.5*  --   --   --   PHOS 3.8  --   --   --    GFR: Estimated Creatinine Clearance: 21.2 mL/min (A) (by C-G formula based on SCr of 1.29 mg/dL (H)). Liver Function Tests: Recent Labs  Lab 07/25/19 1949  ALBUMIN 2.8*   No results for input(s): LIPASE, AMYLASE in the last 168 hours. No results for input(s): AMMONIA in the last 168 hours. Coagulation Profile: No results for input(s): INR, PROTIME in the last 168 hours. Cardiac Enzymes: No results for input(s): CKTOTAL, CKMB, CKMBINDEX, TROPONINI in the last 168 hours. BNP (last 3 results) No results for input(s): PROBNP in the last 8760 hours. HbA1C: No results for input(s): HGBA1C in the last 72 hours. CBG: No results for input(s): GLUCAP in the last 168 hours. Lipid Profile: No results for input(s): CHOL, HDL, LDLCALC, TRIG, CHOLHDL, LDLDIRECT in the last 72 hours. Thyroid Function Tests: No results for input(s): TSH, T4TOTAL, FREET4, T3FREE, THYROIDAB in the last 72 hours. Anemia Panel: No results for input(s): VITAMINB12, FOLATE, FERRITIN, TIBC, IRON, RETICCTPCT in the last 72 hours. Sepsis Labs: No results for input(s): PROCALCITON, LATICACIDVEN in the last 168 hours.  Recent Results (from the past 240 hour(s))  SARS Coronavirus 2 by RT PCR (hospital order, performed in [facility] hospital lab) Nasopharyngeal Nasopharyngeal Swab     Status: None   Collection Time: 07/19/19 10:53 PM   Specimen: Nasopharyngeal Swab  Result Value Ref Range Status    SARS Coronavirus 2 NEGATIVE NEGATIVE Final    Comment: (NOTE) SARS-CoV-2 target nucleic acids are NOT DETECTED.  The SARS-CoV-2 RNA is generally detectable in upper and lower respiratory specimens during the acute phase of infection. The lowest concentration of SARS-CoV-2 viral copies this assay can detect is 250 copies / mL. A negative result does not preclude SARS-CoV-2 infection and should not be used as the sole basis for treatment or other patient management decisions.  A negative result may occur with improper specimen collection / handling, submission of specimen other than nasopharyngeal swab, presence of viral mutation(s) within the areas targeted by this assay, and inadequate number of viral copies (<250 copies / mL). A negative result must be combined with clinical observations, patient history, and epidemiological information.  Fact Sheet for Patients:   StrictlyIdeas.no  Fact Sheet for Healthcare Providers: BankingDealers.co.za  This test is not yet approved or  cleared by the [place] FDA and has been authorized for detection and/or diagnosis of SARS-CoV-2 by FDA under an Emergency Use Authorization (EUA).  This EUA will remain in effect (meaning this test can be used) for the duration of the COVID-19 declaration under Section 564(b)(1) of the Act, 21 U.S.C. section 360bbb-3(b)(1), unless the authorization is terminated or revoked sooner.  Performed at [facility], [place] [address] [place], [place] [address]          Radiology Studies: No results found.      Scheduled Meds: . amLODipine  10 mg Oral Daily  . aspirin EC  325 mg Oral Daily  . budesonide  1 mg Inhalation BID  . clopidogrel  75 mg Oral Daily  . ezetimibe  10 mg Oral  Daily  . guaiFENesin  1,200 mg Oral BID  . heparin  5,000 Units Subcutaneous Q8H  . isosorbide mononitrate  30 mg Oral Daily  . metoprolol succinate  25 mg Oral Daily   . multivitamin with minerals  1 tablet Oral Daily  . nutrition supplement (JUVEN)  1 packet Oral BID BM  . polyethylene glycol  17 g Oral Daily  . polyvinyl alcohol  1 drop Both Eyes QID  . predniSONE  40 mg Oral Q breakfast  . umeclidinium bromide  1 puff Inhalation Daily   Continuous Infusions: . sodium chloride 75 mL/hr at 07/29/19 0429  . [START ON 07/30/2019] cefTRIAXone (ROCEPHIN)  IV       LOS: 9 days    Time spent: 35 min    Nicolette Bang, MD [facility]  If 7PM-7AM, please contact night-coverage  07/29/2019, 12:12 PM

## 2019-07-30 ENCOUNTER — Inpatient Hospital Stay (HOSPITAL_COMMUNITY): Payer: Medicare HMO

## 2019-07-30 DIAGNOSIS — R0989 Other specified symptoms and signs involving the circulatory and respiratory systems: Secondary | ICD-10-CM

## 2019-07-30 LAB — CBC
HCT: 35.8 % — ABNORMAL LOW (ref 36.0–46.0)
Hemoglobin: 11.1 g/dL — ABNORMAL LOW (ref 12.0–15.0)
MCH: 29.2 pg (ref 26.0–34.0)
MCHC: 31 g/dL (ref 30.0–36.0)
MCV: 94.2 fL (ref 80.0–100.0)
Platelets: 187 10*3/uL (ref 150–400)
RBC: 3.8 MIL/uL — ABNORMAL LOW (ref 3.87–5.11)
RDW: 14 % (ref 11.5–15.5)
WBC: 19.8 10*3/uL — ABNORMAL HIGH (ref 4.0–10.5)
nRBC: 0 % (ref 0.0–0.2)

## 2019-07-30 LAB — BASIC METABOLIC PANEL
Anion gap: 13 (ref 5–15)
BUN: 67 mg/dL — ABNORMAL HIGH (ref 8–23)
CO2: 17 mmol/L — ABNORMAL LOW (ref 22–32)
Calcium: 8.7 mg/dL — ABNORMAL LOW (ref 8.9–10.3)
Chloride: 115 mmol/L — ABNORMAL HIGH (ref 98–111)
Creatinine, Ser: 1.39 mg/dL — ABNORMAL HIGH (ref 0.44–1.00)
GFR calc Af Amer: 39 mL/min — ABNORMAL LOW (ref >60)
GFR calc non Af Amer: 34 mL/min — ABNORMAL LOW (ref >60)
Glucose, Bld: 92 mg/dL (ref 70–99)
Potassium: 3.8 mmol/L (ref 3.5–5.1)
Sodium: 145 mmol/L (ref 135–145)

## 2019-07-30 LAB — BRAIN NATRIURETIC PEPTIDE: B Natriuretic Peptide: 232.8 pg/mL — ABNORMAL HIGH (ref 0.0–100.0)

## 2019-07-30 MED ORDER — DEXTROSE-NACL 5-0.45 % IV SOLN: INTRAVENOUS | Status: DC

## 2019-07-30 NOTE — Progress Notes (Signed)
Speech recommended NPO diet for patient at this time. NPO order placed.

## 2019-07-30 NOTE — Progress Notes (Signed)
[facility]                                                                              Patient Demographics  [name] [name], is a 86 y.o. female, DOB - 1927/05/25, [place] date - 07/19/2019   Admitting Physician [name] Boroughs, MD  Outpatient Primary MD for the patient is [name], Standley Brooking, MD  Outpatient specialists:   LOS - 10  days   Medical records reviewed and are as summarized below:    No chief complaint on file.      Brief summary   Patient is a 86 year old female with history of coronary disease status post stenting to RCA in 2005, peripheral vascular disease, COPD, CKD stage IIIa, hypertension, hyperlipidemia, carotid stenosis who presented to the emergency department for the evaluation of left-sided weakness and intermittent expressive aphasia. Initial CT head/MRI of the brain did not show any stroke. Later in the hospital, she developed right-sided weakness. Repeat MRI showed acute infarct in the left corona radiataand basal ganglia. Neurology was consulted and following. Stroke workup completed. Neurology recommended dual antiplatelet therapy with aspirin Plavix for 3 months followed by Plavix monotherapy because of severe left MCA stenosis. PT/OT recommending skilled nursing facility.   Assessment & Plan    Principal Problem:   Acute ischemic left MCA stroke ([place]) -Presented with left-sided weakness, intermittent expressive aphasia. -Initial CT head, MRI of the brain did not show any CVA. -Subsequently she developed right-sided weakness and repeat MRI showed acute multiple punctate CVA in the left basal ganglia and corona radiata - MRA head showed 6 mm short segment severe stenosis involving the mid distal left M1 segment.  -CTA head and neck showed compensated severe left MCA M1 stenosis, severe left vertebral V4 segment stenosis, moderate to severe bilateral PCA stenosis. Also showed bilateral carotid bifurcation  stenosis affecting ICA: 60% on the right and 55 to 60% on the left.  -Neurology was consulted, recommended aspirin 325 mg daily and Plavix for 3 months followed by Plavix indefinitely. -Will need follow-up with [facility] neurology as outpatient upon discharge. -Hemoglobin A1c 5.6, LDL 120 - PT/OT recommending CIR versus skilled nursing facility. - Speech therapy recommended dysphagia 2 diet, noted aspirating, coarse breath sounds -Obtain chest x-ray, BNP, SLP evaluation, change IV fluids to D51/2NS for hyperNa and decrease rate to 50 cc an hour -Palliative medicine consulted for goals of care   Active Problems: COPD exacerbation -No wheezing however coarse breath sounds, possibly volume overload and aspiration -Continue bronchodilators, completed prednisone  Hypernatremia - Na 149 on 6/26, obtain bmet -Changed IV fluids to D5 half-normal saline, decrease rate due to volume overload, I's and O's with 4 L positive  UTI -UA on 6/26 showed moderate leukocytes, few bacteria, WBCs 6-10, patient was started on IV Rocephin, follow urine culture and sensitivities  Hypertension -Continue Norvasc, Toprol, Imdur  Hyperlipidemia LDL 120, has statin intolerance, continue Zetia  CAD -Status post RCA stent, no chest pain, continue current management   CKD stage IIIa: -Chronic and appears stable -Creatinine 1.2 on 6/26, was 1.4 on 6/23, follow BMET  Tobacco abuse:  -Currently smokes. Smoking cessation counseled.  Severe malnutrition:  -BMI of 17.16, now with aspiration and dysphagia, follow SLP, palliative medicine consult  Pressure injury: Pressure Injury 07/25/19 Sacrum Medial Stage 1 -  Intact skin with non-blanchable redness of a localized area usually over a bony prominence. (Active)  07/25/19 0745  Location: Sacrum  Location Orientation: Medial  Staging: Stage 1 -  Intact skin with non-blanchable redness of a localized area usually over a bony prominence.  Wound  Description (Comments):   Present on Admission:     Code Status: full code  DVT Prophylaxis:   heparin  Family Communication: Discussed all imaging results, lab results, explained to the patient.  I was not able to reach her granddaughter [name] [name] however left a detailed message on the phone   Disposition Plan:     Status is: Inpatient  Remains inpatient appropriate because:Inpatient level of care appropriate due to severity of illness   Dispo: The patient is from: Home              Anticipated d/c is to: SNF              Anticipated d/c date is: 2 days              Patient currently is not medically stable to d/c.      Time Spent in minutes 35 minutes  Procedures:  MRI brain, CTA head and neck, 2D echo  Consultants:   Neurology  Palliative medicine  Antimicrobials:   Anti-infectives (From admission, onward)   Start     Dose/Rate Route Frequency Ordered Stop   07/30/19 0800  cefTRIAXone (ROCEPHIN) 1 g in sodium chloride 0.9 % 100 mL IVPB     Discontinue     1 g 200 mL/hr over 30 Minutes Intravenous Every 24 hours 07/29/19 0813     07/29/19 0830  cefTRIAXone (ROCEPHIN) 1 g in sodium chloride 0.9 % 100 mL IVPB        1 g 200 mL/hr over 30 Minutes Intravenous  Once 07/29/19 0815 07/29/19 0900   07/23/19 1000  azithromycin (ZITHROMAX) tablet 500 mg        500 mg Oral Daily 07/23/19 0858 07/25/19 1013          Medications  Scheduled Meds: . amLODipine  10 mg Oral Daily  . aspirin EC  325 mg Oral Daily  . budesonide  1 mg Inhalation BID  . clopidogrel  75 mg Oral Daily  . ezetimibe  10 mg Oral Daily  . guaiFENesin  1,200 mg Oral BID  . heparin  5,000 Units Subcutaneous Q8H  . isosorbide mononitrate  30 mg Oral Daily  . metoprolol succinate  25 mg Oral Daily  . multivitamin with minerals  1 tablet Oral Daily  . nutrition supplement (JUVEN)  1 packet Oral BID BM  . polyethylene glycol  17 g Oral Daily  . polyvinyl alcohol  1 drop Both Eyes QID  .  umeclidinium bromide  1 puff Inhalation Daily   Continuous Infusions: . sodium chloride 75 mL/hr at 07/30/19 0609  . cefTRIAXone (ROCEPHIN)  IV 1 g (07/30/19 0840)   PRN Meds:.acetaminophen **OR** acetaminophen (TYLENOL) oral liquid 160 mg/5 mL **OR** acetaminophen, levalbuterol, [COMPLETED] levalbuterol **FOLLOWED BY** levalbuterol, LORazepam, ondansetron (ZOFRAN) IV, senna-docusate, traMADol      Subjective:   [name] [name] was seen and examined today.  Appears uncomfortable, states she has pain in the back and the leg.  Coarse breath sounds, no fevers.  Per RN, has been pocketing food.  Unable to obtain review of system from the patient.  No acute shortness of breath, fevers, nausea vomiting.  No acute events overnight.    Objective:   Vitals:   07/30/19 0600 07/30/19 0757 07/30/19 0759 07/30/19 0831  BP:    (!) 151/65  Pulse:    62  Resp: 18   18  Temp:    97.9 F (36.6 C)  TempSrc:    Oral  SpO2:  93% 93% 93%  Weight:      Height:        Intake/Output Summary (Last 24 hours) at 07/30/2019 0934 Last data filed at 07/30/2019 0932 Gross per 24 hour  Intake 1773.01 ml  Output --  Net 1773.01 ml     Wt Readings from Last 3 Encounters:  07/19/19 45.4 kg  07/17/19 48 kg  07/31/18 47.6 kg     Exam  General: Alert and oriented x self, awake, uncomfortable, moaning, dysarthria  Cardiovascular: S1 S2 auscultated, no murmurs, RRR  Respiratory: Coarse breath sounds bilaterally  Gastrointestinal: Soft, nontender, nondistended, + bowel sounds  Ext: no pedal edema bilaterally  Neuro: Right-sided weakness  Musculoskeletal: No digital cyanosis, clubbing  Skin: No rashes  Psych: Flat affect   Data Reviewed:  I have personally reviewed following labs and imaging studies  Micro Results No results found for this or any previous visit (from the past 240 hour(s)).  Radiology Reports EEG  Result Date: 07/20/2019 Lora Havens, MD     07/20/2019  3:39 PM  Patient Name: [name] [name] MRN: [id number] Epilepsy Attending: Lora Havens Referring Physician/Provider: Dr. Kerney Elbe Date: 07/20/2019 Duration: 24.12 mins Patient history: 86 year old female presenting with transient spells of expressive aphasia. EEG evaluate for seizures. Level of alertness: Awake AEDs during EEG study: None Technical aspects: This EEG study was done with scalp electrodes positioned according to the 10-20 International system of electrode placement. Electrical activity was acquired at a sampling rate of 500Hz  and reviewed with a high frequency filter of 70Hz  and a low frequency filter of 1Hz . EEG data were recorded continuously and digitally stored. Description: The posterior dominant rhythm consists of 8 Hz activity of moderate voltage (25-35 uV) seen predominantly in posterior head regions, symmetric and reactive to eye opening and eye closing. EEG showed intermittent 2-3Hz  delta slowing in left temporal region.  Hyperventilation and photic stimulation were not performed.   ABNORMALITY -Intermittent slow, left temporal region IMPRESSION: This study is suggestive of non specific cortical dysfunction in left temporal region. No seizures or epileptiform discharges were seen throughout the recording. Lora Havens   CT Code Stroke CTA Head W/WO contrast  Result Date: 07/20/2019 CLINICAL DATA:  86 year old female with neurologic deficit. Unrevealing brain MRI yesterday, but MRA with severe left M1 stenosis. EXAM: CT ANGIOGRAPHY HEAD AND NECK CT PERFUSION BRAIN TECHNIQUE: Multidetector CT imaging of the head and neck was performed using the standard protocol during bolus administration of intravenous contrast. Multiplanar CT image reconstructions and MIPs were obtained to evaluate the vascular anatomy. Carotid stenosis measurements (when applicable) are obtained utilizing NASCET criteria, using the distal internal carotid diameter as the denominator. Multiphase CT imaging of the  brain was performed following IV bolus contrast injection. Subsequent parametric perfusion maps were calculated using RAPID software. CONTRAST:  82mL OMNIPAQUE IOHEXOL 350 MG/ML SOLN COMPARISON:  Brain MRI and intracranial MRA 07/19/2019. FINDINGS: CT Brain Perfusion Findings: ASPECTS: 10 CBF (<30%) Volume: None Perfusion (Tmax>6.0s) volume: None, and no significant T-max asymmetry from the left to  right hemisphere. Mismatch Volume: Not applicable Infarction Location:Not applicable CT HEAD Brain: Calcified atherosclerosis at the skull base. Stable cortical encephalomalacia in the left occipital pole. Patchy bilateral white matter hypodensity is stable. Dystrophic basal ganglia calcifications again noted. No midline shift, ventriculomegaly, mass effect, evidence of mass lesion, intracranial hemorrhage or evidence of cortically based acute infarction. Calvarium and skull base: Mild chronic right orbital floor fracture. No acute osseous abnormality identified. Paranasal sinuses: Sinuses and mastoids are largely clear today. Orbits: No acute orbit or scalp soft tissue finding. CTA NECK Skeleton: Advanced degenerative changes throughout the cervical spine. No acute osseous abnormality identified. Upper chest: Centrilobular emphysema. Mild apical lung scarring. No superior mediastinal lymphadenopathy. Sequelae of right axillary node dissection. Other neck: No acute findings. Aortic arch: Fairly bulky soft and calcified plaque throughout the aortic arch. Three vessel arch configuration. Right carotid system: Brachiocephalic artery and right CCA origin plaque without stenosis. Intermittent plaque in the right CCA without stenosis proximal to the bifurcation. Bulky calcified plaque at the carotid bifurcation and continuing into the proximal right ICA resulting in up to 60 % stenosis with respect to the distal vessel (series 7, image 103). Distal to the bulb no other right ICA plaque to the skull base. Left carotid system:  Left CCA origin and other plaque without stenosis proximal to the bifurcation. Bulky calcified plaque at the left ICA origin and bulb resulting in 55-60 % stenosis with respect to the distal vessel (series 10, image 101). Mildly tortuous left ICA. Vertebral arteries: Proximal right subclavian plaque without stenosis. The right vertebral artery origin appear spared. Mild right V2 segment plaque, no right vertebral stenosis to the skull base. Proximal left subclavian artery soft and calcified plaque is extensive although less than 50 % stenosis with respect to the distal vessel results. Mild stenosis at the left vertebral artery origin. Additional mild soft plaque in the left V1 segment. Patent and slightly non dominant left vertebral artery to the skull base without significant stenosis. CTA HEAD Posterior circulation: Bulky and circumferential calcified plaque of the left V4 segment with high-grade stenosis on series 9, image 145. This is concordant with the MRA yesterday. Mild right V4 calcified plaque without significant stenosis. The left vertebral and left PICA remain patent despite the high-grade stenosis. There is moderate stenosis again at the left vertebrobasilar junction. Patent basilar artery with mild irregularity and no significant stenosis. Patent SCA and PCA origins. Posterior communicating arteries are diminutive or absent. Severe left PCA stenosis from the P2 segment on word. Moderate to severe right P2 stenosis with better distal right PCA enhancement. This is concordant with the MRA. Anterior circulation: Both ICA siphons are patent. Calcified plaque on the left with only mild supraclinoid segment stenosis. Right side calcified plaque and tortuosity with mild supraclinoid stenosis. Patent bilateral carotid termini. Patent MCA and ACA origins. Anterior communicating artery, bilateral ACA branches, right MCA M1, and right MCA bifurcation are patent without stenosis. Mild to moderate right M2 and M3  branch irregularities and stenosis appear similar to the MRA. Left MCA M1 demonstrates a relatively long 6 mm segment of moderate to severe stenosis best seen on series 12, image 20. This leads up to the left MCA trifurcation which remains patent. Left MCA branches demonstrate fairly symmetric enhancement to those on the right, and only mild additional irregularity. Venous sinuses: Early contrast timing, not evaluated. Anatomic variants: Mildly dominant right vertebral artery. Review of the MIP images confirms the above findings IMPRESSION: 1. Compensated Severe Left MCA  M1 stenosis. CTP is negative for infarct core or penumbra. And CT appearance of the brain is stable. 2. Other intracranial CTA findings concordant to the MRA yesterday including: - Severe Left Vertebral V4 segment stenosis. - Moderate to severe bilateral PCA stenosis worse on the left. 3. Extracranial calcified plaque resulting in bilateral carotid bifurcation stenosis affecting the ICAs: 60% on the Right and 55-60% on the Left . 4. No significant cervical vertebral artery stenosis. 5. Aortic Atherosclerosis (ICD10-I70.0) and Emphysema (ICD10-J43.9). Electronically Signed   By: Genevie Ann M.D.   On: 07/20/2019 04:20   DG Chest 1 View  Result Date: 07/25/2019 CLINICAL DATA:  86 year old female with shortness of breath. EXAM: CHEST  1 VIEW COMPARISON:  Chest radiograph dated 07/24/2019. FINDINGS: No focal consolidation, pleural effusion or pneumothorax. The cardiac silhouette is within limits. Atherosclerotic calcification of the aorta. No acute osseous pathology. Degenerative changes of the spine. IMPRESSION: No active cardiopulmonary disease. Electronically Signed   By: Anner Crete M.D.   On: 07/25/2019 21:35   DG Chest 2 View  Result Date: 07/17/2019 CLINICAL DATA:  Chest tightness since last evening, lower extremity numbness EXAM: CHEST - 2 VIEW COMPARISON:  11/23/2018 FINDINGS: Frontal and lateral views of the chest demonstrate a stable  cardiac silhouette. Continued atherosclerosis of the thoracic aorta. Chronic elevation of the left hemidiaphragm. Background emphysema without airspace disease, effusion, or pneumothorax. No acute bony abnormalities. IMPRESSION: 1. Stable exam, no acute process. Electronically Signed   By: Randa Ngo M.D.   On: 07/17/2019 17:21   CT Code Stroke CTA Neck W/WO contrast  Result Date: 07/20/2019 CLINICAL DATA:  86 year old female with neurologic deficit. Unrevealing brain MRI yesterday, but MRA with severe left M1 stenosis. EXAM: CT ANGIOGRAPHY HEAD AND NECK CT PERFUSION BRAIN TECHNIQUE: Multidetector CT imaging of the head and neck was performed using the standard protocol during bolus administration of intravenous contrast. Multiplanar CT image reconstructions and MIPs were obtained to evaluate the vascular anatomy. Carotid stenosis measurements (when applicable) are obtained utilizing NASCET criteria, using the distal internal carotid diameter as the denominator. Multiphase CT imaging of the brain was performed following IV bolus contrast injection. Subsequent parametric perfusion maps were calculated using RAPID software. CONTRAST:  50mL OMNIPAQUE IOHEXOL 350 MG/ML SOLN COMPARISON:  Brain MRI and intracranial MRA 07/19/2019. FINDINGS: CT Brain Perfusion Findings: ASPECTS: 10 CBF (<30%) Volume: None Perfusion (Tmax>6.0s) volume: None, and no significant T-max asymmetry from the left to right hemisphere. Mismatch Volume: Not applicable Infarction Location:Not applicable CT HEAD Brain: Calcified atherosclerosis at the skull base. Stable cortical encephalomalacia in the left occipital pole. Patchy bilateral white matter hypodensity is stable. Dystrophic basal ganglia calcifications again noted. No midline shift, ventriculomegaly, mass effect, evidence of mass lesion, intracranial hemorrhage or evidence of cortically based acute infarction. Calvarium and skull base: Mild chronic right orbital floor fracture. No  acute osseous abnormality identified. Paranasal sinuses: Sinuses and mastoids are largely clear today. Orbits: No acute orbit or scalp soft tissue finding. CTA NECK Skeleton: Advanced degenerative changes throughout the cervical spine. No acute osseous abnormality identified. Upper chest: Centrilobular emphysema. Mild apical lung scarring. No superior mediastinal lymphadenopathy. Sequelae of right axillary node dissection. Other neck: No acute findings. Aortic arch: Fairly bulky soft and calcified plaque throughout the aortic arch. Three vessel arch configuration. Right carotid system: Brachiocephalic artery and right CCA origin plaque without stenosis. Intermittent plaque in the right CCA without stenosis proximal to the bifurcation. Bulky calcified plaque at the carotid bifurcation and continuing into the  proximal right ICA resulting in up to 60 % stenosis with respect to the distal vessel (series 7, image 103). Distal to the bulb no other right ICA plaque to the skull base. Left carotid system: Left CCA origin and other plaque without stenosis proximal to the bifurcation. Bulky calcified plaque at the left ICA origin and bulb resulting in 55-60 % stenosis with respect to the distal vessel (series 10, image 101). Mildly tortuous left ICA. Vertebral arteries: Proximal right subclavian plaque without stenosis. The right vertebral artery origin appear spared. Mild right V2 segment plaque, no right vertebral stenosis to the skull base. Proximal left subclavian artery soft and calcified plaque is extensive although less than 50 % stenosis with respect to the distal vessel results. Mild stenosis at the left vertebral artery origin. Additional mild soft plaque in the left V1 segment. Patent and slightly non dominant left vertebral artery to the skull base without significant stenosis. CTA HEAD Posterior circulation: Bulky and circumferential calcified plaque of the left V4 segment with high-grade stenosis on series 9,  image 145. This is concordant with the MRA yesterday. Mild right V4 calcified plaque without significant stenosis. The left vertebral and left PICA remain patent despite the high-grade stenosis. There is moderate stenosis again at the left vertebrobasilar junction. Patent basilar artery with mild irregularity and no significant stenosis. Patent SCA and PCA origins. Posterior communicating arteries are diminutive or absent. Severe left PCA stenosis from the P2 segment on word. Moderate to severe right P2 stenosis with better distal right PCA enhancement. This is concordant with the MRA. Anterior circulation: Both ICA siphons are patent. Calcified plaque on the left with only mild supraclinoid segment stenosis. Right side calcified plaque and tortuosity with mild supraclinoid stenosis. Patent bilateral carotid termini. Patent MCA and ACA origins. Anterior communicating artery, bilateral ACA branches, right MCA M1, and right MCA bifurcation are patent without stenosis. Mild to moderate right M2 and M3 branch irregularities and stenosis appear similar to the MRA. Left MCA M1 demonstrates a relatively long 6 mm segment of moderate to severe stenosis best seen on series 12, image 20. This leads up to the left MCA trifurcation which remains patent. Left MCA branches demonstrate fairly symmetric enhancement to those on the right, and only mild additional irregularity. Venous sinuses: Early contrast timing, not evaluated. Anatomic variants: Mildly dominant right vertebral artery. Review of the MIP images confirms the above findings IMPRESSION: 1. Compensated Severe Left MCA M1 stenosis. CTP is negative for infarct core or penumbra. And CT appearance of the brain is stable. 2. Other intracranial CTA findings concordant to the MRA yesterday including: - Severe Left Vertebral V4 segment stenosis. - Moderate to severe bilateral PCA stenosis worse on the left. 3. Extracranial calcified plaque resulting in bilateral carotid  bifurcation stenosis affecting the ICAs: 60% on the Right and 55-60% on the Left . 4. No significant cervical vertebral artery stenosis. 5. Aortic Atherosclerosis (ICD10-I70.0) and Emphysema (ICD10-J43.9). Electronically Signed   By: Genevie Ann M.D.   On: 07/20/2019 04:20   MR ANGIO HEAD WO CONTRAST  Result Date: 07/20/2019 CLINICAL DATA:  Follow-up examination for acute stroke. EXAM: MRA HEAD WITHOUT CONTRAST TECHNIQUE: Angiographic images of the Circle of Willis were obtained using MRA technique without intravenous contrast. COMPARISON:  Comparison made with prior brain MRI from earlier the same day. FINDINGS: ANTERIOR CIRCULATION: Examination mildly degraded by motion artifact. Distal cervical segments of both internal carotid arteries are widely patent with symmetric antegrade flow. Petrous, cavernous, and supraclinoid ICAs  widely patent without stenosis or other abnormality. A1 segments patent. Normal anterior communicating artery complex. Anterior cerebral arteries patent to their distal aspects without stenosis. Right M1 widely patent. Normal right MCA bifurcation. Distal right MCA branches well perfused. Focal severe stenosis seen involving the mid-distal left M1 segment (series 1045, image 13). Stenosis measures approximately 6 mm in length. Left MCA bifurcation patent distally. Distal left MCA branches are perfused, although mildly attenuated as compared to the contralateral right MCA branches. POSTERIOR CIRCULATION: Right vertebral artery dominant and widely patent to the vertebrobasilar junction. Left vertebral artery not seen as it courses into the cranial vault, and may be partially occluded. Flow related signal is seen within the distal left V4 segment with filling of the left PICA, which could be retrograde in nature across the vertebrobasilar junction. Right PICA not seen. Basilar widely patent to its distal aspect without stenosis. Superior cerebral arteries patent bilaterally. Both PCAs primarily  supplied via the basilar. Atheromatous change with associated moderate bilateral P2 stenoses noted. PCAs remain patent to their distal aspects. No intracranial aneurysm. IMPRESSION: 1. 6 mm short segment severe stenosis involving the mid-distal left M1 segment. Distal left MCA branches are perfused, although attenuated as compared to the contralateral right MCA branches. 2. Nonvisualization of the left vertebral artery as it courses into the cranial vault, which may be partially occluded. Retrograde filling of the left V4 segment and left PICA across the vertebrobasilar junction. 3. Atheromatous change throughout the PCAs bilaterally with associated moderate bilateral P2 stenoses. Electronically Signed   By: Jeannine Boga M.D.   On: 07/20/2019 02:00   MR BRAIN WO CONTRAST  Result Date: 07/20/2019 CLINICAL DATA:  Stroke follow-up. Right facial droop and mild right hemiparesis. EXAM: MRI HEAD WITHOUT CONTRAST TECHNIQUE: Multiplanar, multiecho pulse sequences of the brain and surrounding structures were obtained without intravenous contrast. COMPARISON:  Head CT, CTA, and CTP 07/20/2019 and MRI 07/19/2019 FINDINGS: At the request of the ordering neurologist, only axial and coronal diffusion weighted imaging was obtained. There are new punctate acute infarcts involving the left corona radiata and superior left lentiform nucleus. No intracranial mass effect or extra-axial fluid collection is identified. Assessment of chronic findings including chronic small vessel ischemia in the cerebral white matter is deferred to yesterday's complete brain MRI. IMPRESSION: Punctate acute infarcts in the left corona radiata and basal ganglia. Electronically Signed   By: Logan Bores M.D.   On: 07/20/2019 20:49   MR BRAIN WO CONTRAST  Result Date: 07/19/2019 CLINICAL DATA:  Ataxia. EXAM: MRI HEAD WITHOUT CONTRAST TECHNIQUE: Multiplanar, multiecho pulse sequences of the brain and surrounding structures were obtained  without intravenous contrast. COMPARISON:  Head CT 06/08/2016 FINDINGS: Brain: Diffusion imaging does not show any acute or subacute infarction. There chronic small-vessel ischemic changes of the pons. No focal cerebellar stroke. There is an old left occipital cortical and subcortical infarction. There are chronic small-vessel ischemic changes of the cerebral hemispheric white matter. Old small vessel infarction in the right thalamus. No sign of mass lesion, hemorrhage, hydrocephalus or extra-axial collection. Vascular: Major vessels at the base of the brain show flow. Skull and upper cervical spine: 2 foci of signal within the posterior parietal calvarium towards the vertex, most consistent with hemangiomas. Sinuses/Orbits: Clear/normal Other: None IMPRESSION: No acute brain finding. No abnormality seen to explain acute ataxia. Chronic small-vessel change of the pons and hemispheric white matter. 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 Left mastoid effusion. Electronically Signed   By: Jan Fireman.D.  On: 07/19/2019 18:52   CT Code Stroke Cerebral Perfusion with contrast  Result Date: 07/20/2019 CLINICAL DATA:  86 year old female with neurologic deficit. Unrevealing brain MRI yesterday, but MRA with severe left M1 stenosis. EXAM: CT ANGIOGRAPHY HEAD AND NECK CT PERFUSION BRAIN TECHNIQUE: Multidetector CT imaging of the head and neck was performed using the standard protocol during bolus administration of intravenous contrast. Multiplanar CT image reconstructions and MIPs were obtained to evaluate the vascular anatomy. Carotid stenosis measurements (when applicable) are obtained utilizing NASCET criteria, using the distal internal carotid diameter as the denominator. Multiphase CT imaging of the brain was performed following IV bolus contrast injection. Subsequent parametric perfusion maps were calculated using RAPID software. CONTRAST:  54mL OMNIPAQUE IOHEXOL 350 MG/ML SOLN COMPARISON:   Brain MRI and intracranial MRA 07/19/2019. FINDINGS: CT Brain Perfusion Findings: ASPECTS: 10 CBF (<30%) Volume: None Perfusion (Tmax>6.0s) volume: None, and no significant T-max asymmetry from the left to right hemisphere. Mismatch Volume: Not applicable Infarction Location:Not applicable CT HEAD Brain: Calcified atherosclerosis at the skull base. Stable cortical encephalomalacia in the left occipital pole. Patchy bilateral white matter hypodensity is stable. Dystrophic basal ganglia calcifications again noted. No midline shift, ventriculomegaly, mass effect, evidence of mass lesion, intracranial hemorrhage or evidence of cortically based acute infarction. Calvarium and skull base: Mild chronic right orbital floor fracture. No acute osseous abnormality identified. Paranasal sinuses: Sinuses and mastoids are largely clear today. Orbits: No acute orbit or scalp soft tissue finding. CTA NECK Skeleton: Advanced degenerative changes throughout the cervical spine. No acute osseous abnormality identified. Upper chest: Centrilobular emphysema. Mild apical lung scarring. No superior mediastinal lymphadenopathy. Sequelae of right axillary node dissection. Other neck: No acute findings. Aortic arch: Fairly bulky soft and calcified plaque throughout the aortic arch. Three vessel arch configuration. Right carotid system: Brachiocephalic artery and right CCA origin plaque without stenosis. Intermittent plaque in the right CCA without stenosis proximal to the bifurcation. Bulky calcified plaque at the carotid bifurcation and continuing into the proximal right ICA resulting in up to 60 % stenosis with respect to the distal vessel (series 7, image 103). Distal to the bulb no other right ICA plaque to the skull base. Left carotid system: Left CCA origin and other plaque without stenosis proximal to the bifurcation. Bulky calcified plaque at the left ICA origin and bulb resulting in 55-60 % stenosis with respect to the distal vessel  (series 10, image 101). Mildly tortuous left ICA. Vertebral arteries: Proximal right subclavian plaque without stenosis. The right vertebral artery origin appear spared. Mild right V2 segment plaque, no right vertebral stenosis to the skull base. Proximal left subclavian artery soft and calcified plaque is extensive although less than 50 % stenosis with respect to the distal vessel results. Mild stenosis at the left vertebral artery origin. Additional mild soft plaque in the left V1 segment. Patent and slightly non dominant left vertebral artery to the skull base without significant stenosis. CTA HEAD Posterior circulation: Bulky and circumferential calcified plaque of the left V4 segment with high-grade stenosis on series 9, image 145. This is concordant with the MRA yesterday. Mild right V4 calcified plaque without significant stenosis. The left vertebral and left PICA remain patent despite the high-grade stenosis. There is moderate stenosis again at the left vertebrobasilar junction. Patent basilar artery with mild irregularity and no significant stenosis. Patent SCA and PCA origins. Posterior communicating arteries are diminutive or absent. Severe left PCA stenosis from the P2 segment on word. Moderate to severe right P2 stenosis with better  distal right PCA enhancement. This is concordant with the MRA. Anterior circulation: Both ICA siphons are patent. Calcified plaque on the left with only mild supraclinoid segment stenosis. Right side calcified plaque and tortuosity with mild supraclinoid stenosis. Patent bilateral carotid termini. Patent MCA and ACA origins. Anterior communicating artery, bilateral ACA branches, right MCA M1, and right MCA bifurcation are patent without stenosis. Mild to moderate right M2 and M3 branch irregularities and stenosis appear similar to the MRA. Left MCA M1 demonstrates a relatively long 6 mm segment of moderate to severe stenosis best seen on series 12, image 20. This leads up to  the left MCA trifurcation which remains patent. Left MCA branches demonstrate fairly symmetric enhancement to those on the right, and only mild additional irregularity. Venous sinuses: Early contrast timing, not evaluated. Anatomic variants: Mildly dominant right vertebral artery. Review of the MIP images confirms the above findings IMPRESSION: 1. Compensated Severe Left MCA M1 stenosis. CTP is negative for infarct core or penumbra. And CT appearance of the brain is stable. 2. Other intracranial CTA findings concordant to the MRA yesterday including: - Severe Left Vertebral V4 segment stenosis. - Moderate to severe bilateral PCA stenosis worse on the left. 3. Extracranial calcified plaque resulting in bilateral carotid bifurcation stenosis affecting the ICAs: 60% on the Right and 55-60% on the Left . 4. No significant cervical vertebral artery stenosis. 5. Aortic Atherosclerosis (ICD10-I70.0) and Emphysema (ICD10-J43.9). Electronically Signed   By: Genevie Ann M.D.   On: 07/20/2019 04:20   DG CHEST PORT 1 VIEW  Result Date: 07/24/2019 CLINICAL DATA:  Shortness of breath. EXAM: PORTABLE CHEST 1 VIEW COMPARISON:  July 21, 2019. FINDINGS: The heart size and mediastinal contours are within normal limits. No pneumothorax or pleural effusion is noted. Atherosclerosis of thoracic aorta is noted. Both lungs are clear. The visualized skeletal structures are unremarkable. IMPRESSION: No active disease. Aortic Atherosclerosis (ICD10-I70.0). Electronically Signed   By: Marijo Conception M.D.   On: 07/24/2019 14:48   DG CHEST PORT 1 VIEW  Result Date: 07/21/2019 CLINICAL DATA:  86 year old female with bilateral crackle. EXAM: PORTABLE CHEST 1 VIEW COMPARISON:  Chest radiograph dated 07/21/2019. FINDINGS: Probable trace left pleural effusion or pleural thickening with minimal left lung base atelectasis. No focal consolidation, or pneumothorax. The cardiac silhouette is within limits. Atherosclerotic calcification of the aorta.  Probable old healed left lateral rib fracture. No acute osseous pathology. Degenerative changes of the spine. Right axillary surgical clips. IMPRESSION: Probable trace left pleural effusion with minimal left lung base atelectasis. Electronically Signed   By: Anner Crete M.D.   On: 07/21/2019 21:26   DG CHEST PORT 1 VIEW  Result Date: 07/21/2019 CLINICAL DATA:  Cough. EXAM: PORTABLE CHEST 1 VIEW COMPARISON:  July 20, 2019 FINDINGS: The heart, hila, mediastinum, lungs, and pleura are unchanged and unremarkable. IMPRESSION: No active disease. Electronically Signed   By: Dorise Bullion III M.D   On: 07/21/2019 11:21   DG CHEST PORT 1 VIEW  Result Date: 07/20/2019 CLINICAL DATA:  Hypothermia EXAM: PORTABLE CHEST 1 VIEW COMPARISON:  07/19/2019 FINDINGS: There is hyperinflation of the lungs compatible with COPD. Heart is normal size. Aortic atherosclerosis. No confluent opacities or effusions. No acute bony abnormality. IMPRESSION: COPD.  No active disease. Electronically Signed   By: Rolm Baptise M.D.   On: 07/20/2019 02:27   DG Chest Port 1 View  Result Date: 07/19/2019 CLINICAL DATA:  [place] EXAM: PORTABLE CHEST 1 VIEW COMPARISON:  07/17/2019 FINDINGS: Single frontal view  of the chest was performed, excluding the right costophrenic angle by collimation. The cardiac silhouette is unremarkable. Stable atherosclerosis of the aortic arch. No airspace disease, effusion, or pneumothorax. No acute bony abnormalities. IMPRESSION: 1. Stable exam, no acute process. Electronically Signed   By: Randa Ngo M.D.   On: 07/19/2019 18:31   DG Swallowing Func-Speech Pathology  Result Date: 07/24/2019 Objective Swallowing Evaluation: Type of Study: MBS-Modified Barium Swallow Study  Patient Details Name: [name] [name] MRN: [id number] Date of Birth: December 07, 1927 Today's Date: 07/24/2019 Time: SLP Start Time (ACUTE ONLY): 1215 -SLP Stop Time (ACUTE ONLY): 1245 SLP Time Calculation (min) (ACUTE ONLY): 30 min Past  Medical History: Past Medical History: Diagnosis Date . CARCINOMA, BASAL CELL 02/13/2006 . CAROTID ARTERY DISEASE 10/03/2006  Carotid US 11/17: Stable 40-59% bilateral ICA stenosis >> f/u 1 year . CHRONIC OBSTRUCTIVE PULMONARY DISEASE, ACUTE EXACERBATION 11/17/2009 . Chronic rhinitis 12/13/2006 . CORONARY ARTERY DISEASE 02/13/2006 . DEGENERATIVE JOINT DISEASE 09/27/2006 . DERMATITIS 09/25/2007 . Head trauma   closed . HYPERLIPIDEMIA 03/10/2008 . HYPERTENSION 02/13/2006 . HYPOTHYROIDISM 01/30/2007 . LIVER FUNCTION TESTS, ABNORMAL 01/22/2009 . LOSS, HEARING NOS 08/25/2006 . Near syncope 09/18/2014 . NEOPLASM, SKIN, UNCERTAIN BEHAVIOR 89/21/1941 . NEPHROLITHIASIS, HX OF 02/27/2010 . OSTEOARTHRITIS, HAND 12/16/2008 . PERIPHERAL VASCULAR DISEASE 02/13/2006 . Personal history of malignant neoplasm of breast 07/24/2008 . Personal history of urinary calculi  . Post-traumatic wound infection 12/01/2010  Mild streaking superiorly and on one day of septra  Will give rocephin and add keflex  adn follow up  . POSTHERPETIC NEURALGIA 08/25/2006 . RENAL ARTERY STENOSIS 08/01/2008 . Shingles   recurrent . SPINAL STENOSIS 11/01/2006 . THROMBOCYTOPENIA 07/30/2008 . UNS ADVRS EFF UNS RX MEDICINAL&BIOLOGICAL SBSTNC 01/30/2007 . UNSPECIFIED ANEMIA 03/18/2008 . Unspecified vitamin D deficiency 02/07/2007 . UNSTABLE ANGINA 12/12/2009 . Vitreous detachment  Past Surgical History: Past Surgical History: Procedure Laterality Date . ABDOMINAL HYSTERECTOMY   . BREAST LUMPECTOMY   . BREAST LUMPECTOMY  1998 . CATARACT EXTRACTION   . CORONARY ANGIOPLASTY WITH STENT PLACEMENT    bilat iliac stents, left renal artery and rt coronary artery last myoview 8/09 with EF 70% . LUMBAR LAMINECTOMY   . SKIN BIOPSY  08/24/2017  Invasive squamous cell carcinoma . SKIN CANCER EXCISION   . TONSILLECTOMY   HPI: [name] [name] is a 86 y.o. female who presents to the ED for evaluation of left-sided weakness and intermittent expressive aphasia.  MRI showed multiple punctate  strokes at left BG and corona radiata. Initial bedside swallow eval was completed 6/18. Patient reported to have worsening of right sided weakness-->now flaccid with significant deficits.  Subjective: alert Assessment / Plan / Recommendation CHL IP CLINICAL IMPRESSIONS 07/24/2019 Clinical Impression Pt presents with moderate oropharyngeal dysphagia with intermittent aspiration of thin liquids before/during the swallow response - aspiration was not consistently accompanied by a cough response - it was occasionally silent in nature.  There was prolonged and decreased coordination of the oral phase of the swallow, with spillage from right side of oral cavity and premature spillage over base of tongue.  Aspiration was generally due to a problem with timing of laryngeal vestibule closure.  Nectar-thick liquids allowed for improved timing and no observed penetration/aspiration.  Pt had difficulty following commands for postural/strategic adjustments.  Anatomy was remarkable for degenerative changes in the cervical spine - this did not impact function. Calcified plaque in the arteries was also notable.  Recommend changing diet to dysphagia 3, nectar-thick liquids. Treatment should focus on improving coordination/sensory reception within oral cavity as  well as timing of pharyngeal response.   SLP Visit Diagnosis Dysphagia, oropharyngeal phase (R13.12) Attention and concentration deficit following -- Frontal lobe and executive function deficit following -- Impact on safety and function Moderate aspiration risk   CHL IP TREATMENT RECOMMENDATION 07/24/2019 Treatment Recommendations Therapy as outlined in treatment plan below   Prognosis 07/20/2019 Prognosis for Safe Diet Advancement Good Barriers to Reach Goals -- Barriers/Prognosis Comment -- CHL IP DIET RECOMMENDATION 07/24/2019 SLP Diet Recommendations Dysphagia 3 (Mech soft) solids;Nectar thick liquid Liquid Administration via Cup Medication Administration Crushed with puree  Compensations Slow rate;Small sips/bites Postural Changes Remain semi-upright after after feeds/meals (Comment)   CHL IP OTHER RECOMMENDATIONS 07/24/2019 Recommended Consults -- Oral Care Recommendations Oral care BID Other Recommendations Order thickener from pharmacy   CHL IP FOLLOW UP RECOMMENDATIONS 07/24/2019 Follow up Recommendations Skilled Nursing facility   [facility] IP FREQUENCY AND DURATION 07/24/2019 Speech Therapy Frequency (ACUTE ONLY) min 2x/week Treatment Duration 1 week      CHL IP ORAL PHASE 07/24/2019 Oral Phase Impaired Oral - Pudding Teaspoon -- Oral - Pudding Cup -- Oral - Honey Teaspoon -- Oral - Honey Cup -- Oral - Nectar Teaspoon -- Oral - Nectar Cup Right anterior bolus loss;Weak lingual manipulation;Right pocketing in lateral sulci;Delayed oral transit;Decreased bolus cohesion Oral - Nectar Straw -- Oral - Thin Teaspoon Right anterior bolus loss;Weak lingual manipulation;Right pocketing in lateral sulci;Delayed oral transit;Decreased bolus cohesion Oral - Thin Cup Right anterior bolus loss;Weak lingual manipulation;Right pocketing in lateral sulci;Delayed oral transit;Decreased bolus cohesion Oral - Thin Straw Right anterior bolus loss;Weak lingual manipulation;Right pocketing in lateral sulci;Delayed oral transit;Decreased bolus cohesion Oral - Puree Weak lingual manipulation;Right pocketing in lateral sulci;Delayed oral transit;Decreased bolus cohesion Oral - Mech Soft -- Oral - Regular Weak lingual manipulation;Right pocketing in lateral sulci;Delayed oral transit;Decreased bolus cohesion Oral - Multi-Consistency -- Oral - Pill -- Oral Phase - Comment --  CHL IP PHARYNGEAL PHASE 07/24/2019 Pharyngeal Phase Impaired Pharyngeal- Pudding Teaspoon -- Pharyngeal -- Pharyngeal- Pudding Cup -- Pharyngeal -- Pharyngeal- Honey Teaspoon -- Pharyngeal -- Pharyngeal- Honey Cup -- Pharyngeal -- Pharyngeal- Nectar Teaspoon -- Pharyngeal -- Pharyngeal- Nectar Cup Delayed swallow initiation-vallecula;Delayed  swallow initiation-pyriform sinuses;Reduced tongue base retraction Pharyngeal -- Pharyngeal- Nectar Straw -- Pharyngeal -- Pharyngeal- Thin Teaspoon Delayed swallow initiation-pyriform sinuses Pharyngeal -- Pharyngeal- Thin Cup Delayed swallow initiation-pyriform sinuses;Reduced airway/laryngeal closure;Penetration/Aspiration before swallow;Penetration/Aspiration during swallow;Trace aspiration;Pharyngeal residue - valleculae Pharyngeal Material enters airway, passes BELOW cords and not ejected out despite cough attempt by patient;Material enters airway, passes BELOW cords without attempt by patient to eject out (silent aspiration) Pharyngeal- Thin Straw Delayed swallow initiation-pyriform sinuses;Reduced airway/laryngeal closure;Penetration/Aspiration before swallow;Penetration/Aspiration during swallow;Trace aspiration;Pharyngeal residue - valleculae Pharyngeal Material enters airway, passes BELOW cords and not ejected out despite cough attempt by patient;Material enters airway, passes BELOW cords without attempt by patient to eject out (silent aspiration) Pharyngeal- Puree Delayed swallow initiation-vallecula;Pharyngeal residue - valleculae Pharyngeal -- Pharyngeal- Mechanical Soft -- Pharyngeal -- Pharyngeal- Regular Delayed swallow initiation-vallecula;Pharyngeal residue - valleculae Pharyngeal -- Pharyngeal- Multi-consistency -- Pharyngeal -- Pharyngeal- Pill -- Pharyngeal -- Pharyngeal Comment --  CHL IP CERVICAL ESOPHAGEAL PHASE 02/04/2017 Cervical Esophageal Phase (No Data) Pudding Teaspoon -- Pudding Cup -- Honey Teaspoon -- Honey Cup -- Nectar Teaspoon -- Nectar Cup -- Nectar Straw -- Thin Teaspoon -- Thin Cup -- Thin Straw -- Puree -- Mechanical Soft -- Regular -- Multi-consistency -- Pill -- Cervical Esophageal Comment -- [name] [name] 07/24/2019, 1:28 PM  ECHOCARDIOGRAM COMPLETE  Result Date: 07/20/2019    ECHOCARDIOGRAM REPORT   Patient Name:   [name] [name] Date of  Exam: 07/20/2019 Medical Rec #:  [id number]             Height:       64.0 in Accession #:    [id number]            Weight:       100.0 lb Date of Birth:  January 06, 1928            BSA:          1.457 m Patient Age:    52 years              BP:           155/63 mmHg Patient Gender: F                     HR:           62 bpm. Exam Location:  Inpatient Procedure: 2D Echo, Cardiac Doppler and Color Doppler Indications:    TIA 435.9 / G45.9  History:        Patient has prior history of Echocardiogram examinations, most                 recent 11/16/2015. CAD, Carotid Disease; Risk                 Factors:Hypertension and Dyslipidemia. Hypothyroidism.  Sonographer:    Jonelle Sidle Dance Referring Phys: [id number] [place]  1. Left ventricular ejection fraction, by estimation, is 60 to 65%. The left ventricle has normal function. The left ventricle has no regional wall motion abnormalities. Left ventricular diastolic parameters are consistent with Grade I diastolic dysfunction (impaired relaxation). Elevated left atrial pressure.  2. Right ventricular systolic function is normal. The right ventricular size is normal. There is normal pulmonary artery systolic pressure. The estimated right ventricular systolic pressure is 92.1 mmHg.  3. The mitral valve is normal in structure. Mild mitral valve regurgitation. No evidence of mitral stenosis.  4. Tricuspid valve regurgitation is moderate.  5. The aortic valve is normal in structure. Aortic valve regurgitation is trivial. Mild aortic valve sclerosis is present, with no evidence of aortic valve stenosis.  6. The inferior vena cava is normal in size with greater than 50% respiratory variability, suggesting right atrial pressure of 3 mmHg. FINDINGS  Left Ventricle: Left ventricular ejection fraction, by estimation, is 60 to 65%. The left ventricle has normal function. The left ventricle has no regional wall motion abnormalities. The left ventricular internal cavity size was  normal in size. There is  no left ventricular hypertrophy. Left ventricular diastolic parameters are consistent with Grade I diastolic dysfunction (impaired relaxation). Elevated left atrial pressure. Right Ventricle: The right ventricular size is normal. No increase in right ventricular wall thickness. Right ventricular systolic function is normal. There is normal pulmonary artery systolic pressure. The tricuspid regurgitant velocity is 2.30 m/s, and  with an assumed right atrial pressure of 8 mmHg, the estimated right ventricular systolic pressure is 19.4 mmHg. Left Atrium: Left atrial size was normal in size. Right Atrium: Right atrial size was normal in size. Pericardium: There is no evidence of pericardial effusion. Mitral Valve: The mitral valve is normal in structure. Normal mobility of the mitral valve leaflets. Mild mitral valve regurgitation. No evidence of mitral valve stenosis. Tricuspid Valve: The tricuspid valve is normal in structure. Tricuspid valve regurgitation is  moderate . No evidence of tricuspid stenosis. Aortic Valve: The aortic valve is normal in structure. Aortic valve regurgitation is trivial. Aortic regurgitation PHT measures 503 msec. Mild aortic valve sclerosis is present, with no evidence of aortic valve stenosis. Pulmonic Valve: The pulmonic valve was normal in structure. Pulmonic valve regurgitation is not visualized. No evidence of pulmonic stenosis. Aorta: The aortic root is normal in size and structure. Venous: The inferior vena cava is normal in size with greater than 50% respiratory variability, suggesting right atrial pressure of 3 mmHg. IAS/Shunts: No atrial level shunt detected by color flow Doppler.  LEFT VENTRICLE PLAX 2D LVIDd:         3.90 cm  Diastology LVIDs:         2.70 cm  LV e' lateral:   4.40 cm/s LV PW:         0.90 cm  LV E/e' lateral: 12.4 LV IVS:        0.80 cm  LV e' medial:    3.50 cm/s LVOT diam:     2.20 cm  LV E/e' medial:  15.5 LV SV:         67 LV SV  Index:   46 LVOT Area:     3.80 cm  RIGHT VENTRICLE             IVC RV Basal diam:  3.40 cm     IVC diam: 2.20 cm RV Mid diam:    2.30 cm RV S prime:     10.90 cm/s TAPSE (M-mode): 2.5 cm LEFT ATRIUM             Index       RIGHT ATRIUM           Index LA diam:        3.10 cm 2.13 cm/m  RA Area:     15.40 cm LA Vol (A2C):   19.7 ml 13.52 ml/m RA Volume:   37.60 ml  25.80 ml/m LA Vol (A4C):   23.3 ml 15.99 ml/m LA Biplane Vol: 21.6 ml 14.82 ml/m  AORTIC VALVE LVOT Vmax:   66.60 cm/s LVOT Vmean:  43.000 cm/s LVOT VTI:    0.176 m AI PHT:      503 msec  AORTA Ao Root diam: 2.80 cm Ao Asc diam:  3.50 cm MITRAL VALVE               TRICUSPID VALVE MV Area (PHT): 2.22 cm    TR Peak grad:   21.2 mmHg MV Decel Time: 342 msec    TR Vmax:        230.00 cm/s MV E velocity: 54.40 cm/s MV A velocity: 75.80 cm/s  SHUNTS MV E/A ratio:  0.72        Systemic VTI:  0.18 m                            Systemic Diam: 2.20 cm Dani Gobble Croitoru MD Electronically signed by Sanda Klein MD Signature Date/Time: 07/20/2019/10:39:24 AM    Final    VAS US CAROTID (at [facility] and [facility] only)  Result Date: 07/20/2019 Carotid Arterial Duplex Study Indications:       TIA and Carotid artery disease. Risk Factors:      Hypertension, hyperlipidemia. Comparison Study:  03/22/17 bilateral 40-59% Performing Technologist: June Leap RDMS, RVT  Examination Guidelines: A complete evaluation includes B-mode imaging, spectral Doppler, color Doppler, and power Doppler as needed of  all accessible portions of each vessel. Bilateral testing is considered an integral part of a complete examination. Limited examinations for reoccurring indications may be performed as noted.  Right Carotid Findings: +----------+--------+--------+--------+--------------------------+--------+           PSV cm/sEDV cm/sStenosisPlaque Description        Comments +----------+--------+--------+--------+--------------------------+--------+ CCA Prox  57      6                                                   +----------+--------+--------+--------+--------------------------+--------+ CCA Distal73      11                                                 +----------+--------+--------+--------+--------------------------+--------+ ICA Prox  166     33      40-59%  heterogenous and irregular         +----------+--------+--------+--------+--------------------------+--------+ ICA Distal66      19                                                 +----------+--------+--------+--------+--------------------------+--------+ ECA       152     12                                                 +----------+--------+--------+--------+--------------------------+--------+ +----------+--------+-------+----------------+-------------------+           PSV cm/sEDV cmsDescribe        Arm Pressure (mmHG) +----------+--------+-------+----------------+-------------------+ [id number]             Multiphasic, WNL                    +----------+--------+-------+----------------+-------------------+ +---------+--------+--+--------+--+---------+ VertebralPSV cm/s41EDV cm/s11Antegrade +---------+--------+--+--------+--+---------+  Left Carotid Findings: +----------+--------+--------+--------+--------------------+-------------------+           PSV cm/sEDV cm/sStenosisPlaque Description  Comments            +----------+--------+--------+--------+--------------------+-------------------+ CCA Prox  64      9                                                       +----------+--------+--------+--------+--------------------+-------------------+ CCA Distal54      15                                                      +----------+--------+--------+--------+--------------------+-------------------+ ICA Prox  115     23      1-39%   calcific and        acoustic shadowing  irregular           may obscure higher                                                         velocities          +----------+--------+--------+--------+--------------------+-------------------+ ICA Distal96      13                                                      +----------+--------+--------+--------+--------------------+-------------------+ ECA       61      3                                                       +----------+--------+--------+--------+--------------------+-------------------+ +----------+--------+--------+--------+-------------------+           PSV cm/sEDV cm/sDescribeArm Pressure (mmHG) +----------+--------+--------+--------+-------------------+ [id number]             Stenotic                    +----------+--------+--------+--------+-------------------+ +---------+--------+--+--------+--+---------+ VertebralPSV cm/s97EDV cm/s12Antegrade +---------+--------+--+--------+--+---------+   Summary: Right Carotid: Velocities in the right ICA are consistent with a 40-59%                stenosis. Left Carotid: Velocities in the left ICA are consistent with a 1-39% stenosis. Subclavians: Left subclavian artery was stenotic. *See table(s) above for measurements and observations.  Electronically signed by Antony Contras MD on 07/20/2019 at 10:57:45 AM.    Final     Lab Data:  CBC: No results for input(s): WBC, NEUTROABS, HGB, HCT, MCV, PLT in the last 168 hours. Basic Metabolic Panel: Recent Labs  Lab 07/25/19 1949 07/26/19 0908 07/27/19 0508 07/28/19 0327  NA 145 148* 148* 149*  K 3.4* 3.3* 3.7 3.9  CL 111 113* 112* 115*  CO2 21* 22 23 21*  GLUCOSE 180* 102* 129* 106*  BUN 66* 58* 57* 61*  CREATININE 1.44* 1.21* 1.25* 1.29*  CALCIUM 9.5 9.5 9.7 9.5  MG 2.5*  --   --   --   PHOS 3.8  --   --   --    GFR: Estimated Creatinine Clearance: 21.2 mL/min (A) (by C-G formula based on SCr of 1.29 mg/dL (H)). Liver Function Tests: Recent Labs  Lab 07/25/19 1949  ALBUMIN 2.8*   No results  for input(s): LIPASE, AMYLASE in the last 168 hours. No results for input(s): AMMONIA in the last 168 hours. Coagulation Profile: No results for input(s): INR, PROTIME in the last 168 hours. Cardiac Enzymes: No results for input(s): CKTOTAL, CKMB, CKMBINDEX, TROPONINI in the last 168 hours. BNP (last 3 results) No results for input(s): PROBNP in the last 8760 hours. HbA1C: No results for input(s): HGBA1C in the last 72 hours. CBG: No results for input(s): GLUCAP in the last 168 hours. Lipid Profile: No results for input(s): CHOL, HDL, LDLCALC, TRIG, CHOLHDL, LDLDIRECT in the last 72 hours. Thyroid Function Tests: No results for input(s): TSH, T4TOTAL, FREET4, T3FREE, THYROIDAB in  the last 72 hours. Anemia Panel: No results for input(s): VITAMINB12, FOLATE, FERRITIN, TIBC, IRON, RETICCTPCT in the last 72 hours. Urine analysis:    Component Value Date/Time   COLORURINE AMBER (A) 07/28/2019 1524   APPEARANCEUR CLOUDY (A) 07/28/2019 1524   LABSPEC >1.046 (H) 07/28/2019 1524   PHURINE 5.0 07/28/2019 1524   GLUCOSEU NEGATIVE 07/28/2019 1524   HGBUR LARGE (A) 07/28/2019 1524   HGBUR large 02/27/2010 0951   BILIRUBINUR MODERATE (A) 07/28/2019 1524   BILIRUBINUR 1+ 08/12/2014 0937   KETONESUR NEGATIVE 07/28/2019 1524   PROTEINUR 100 (A) 07/28/2019 1524   UROBILINOGEN 0.2 08/12/2014 0937   UROBILINOGEN 0.2 09/21/2010 0236   NITRITE NEGATIVE 07/28/2019 1524   LEUKOCYTESUR MODERATE (A) 07/28/2019 1524     Deuntae Kocsis M.D. [facility] 07/30/2019, 9:34 AM   Call night coverage person covering after 7pm

## 2019-07-30 NOTE — Progress Notes (Signed)
Physical Therapy Treatment Patient Details Name: [name] [name] MRN: [id number] DOB: October 02, 1927 Today's Date: 07/30/2019    History of Present Illness Pt is an 86 y/o female who presents with weakness and aphasia. 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    PT Comments    Patient seen for mobility progression. Pt lying in bed and lethargic. Pt appears to be in more pain today (generlized with mobility) and nodded head no to OOB mobility so this session focused on bed mobility and positioning for decreased risk of contractures and further pressure injuries. Pt with redness at bony prominences of scapulae and sacral wound bandage changed as it was soiled with BM. Requested air mattress for skin integrity and decreased further pressure injuries. PT will continue to follow acutely.    Follow Up Recommendations  SNF;Supervision/Assistance - 24 hour     Equipment Recommendations  Other (comment) (TBD by next venue of care)    Recommendations for Other Services       Precautions / Restrictions Precautions Precautions: Fall Restrictions Weight Bearing Restrictions: No    Mobility  Bed Mobility Overal bed mobility: Needs Assistance Bed Mobility: Supine to Sit Rolling: Mod assist;Max assist         General bed mobility comments: mod A to roll toward R side with hand over hand assist to reach for R bed bail; total A +2 to roll toward L side; +2 assist for pt safety/comfort  Transfers                    Ambulation/Gait                 Stairs             Wheelchair Mobility    Modified Rankin (Stroke Patients Only) Modified Rankin (Stroke Patients Only) Pre-Morbid Rankin Score: No symptoms Modified Rankin: Severe disability     Balance                                            Cognition Arousal/Alertness: Awake/alert Behavior  During Therapy: Flat affect Overall Cognitive Status: Impaired/Different from baseline Area of Impairment: Attention;Following commands;Problem solving                   Current Attention Level: Focused   Following Commands: Follows one step commands inconsistently;Follows one step commands with increased time     Problem Solving: Slow processing;Decreased initiation;Difficulty sequencing;Requires verbal cues;Requires tactile cues General Comments: pt did not attempt to verbalize during session but responded with head nods; pt appeared to be in more pain this session       Exercises      General Comments General comments (skin integrity, edema, etc.): pt soiled with BM upon arrival; pt has sacral wound and protective bandage changed as it was soiled; pt noted to have redness at bony prominence of scapulae (L > R); bruising covering bilat LE and multiple bruises all over body        Pertinent Vitals/Pain Pain Assessment: Faces Faces Pain Scale: Hurts even more Pain Location: generalized with mobility; unable to specify (stomach) Pain Descriptors / Indicators: Grimacing;Moaning Pain Intervention(s): Limited activity within patient's tolerance;Monitored during session;Repositioned    Home Living  Prior Function            PT Goals (current goals can now be found in the care plan section) Progress towards PT goals: Progressing toward goals    Frequency    Min 3X/week      PT Plan Current plan remains appropriate    Co-evaluation              AM-PAC PT "6 Clicks" Mobility   Outcome Measure  Help needed turning from your back to your side while in a flat bed without using bedrails?: A Lot Help needed moving from lying on your back to sitting on the side of a flat bed without using bedrails?: A Lot Help needed moving to and from a bed to a chair (including a wheelchair)?: Total Help needed standing up from a chair using your  arms (e.g., wheelchair or bedside chair)?: Total Help needed to walk in hospital room?: Total Help needed climbing 3-5 steps with a railing? : Total 6 Click Score: 8    End of Session Equipment Utilized During Treatment: Gait belt Activity Tolerance: Patient tolerated treatment well Patient left: with call bell/phone within reach;in bed;with bed alarm set Nurse Communication: Mobility status;Need for lift equipment;Other (comment) (air mattress) PT Visit Diagnosis: Unsteadiness on feet (R26.81);Other symptoms and signs involving the nervous system (R29.898)     Time: 6144-3154 PT Time Calculation (min) (ACUTE ONLY): 27 min  Charges:  $Therapeutic Activity: 23-37 mins                     Earney Navy, PTA Acute Rehabilitation Services Pager: [phone] Office: [phone]     Darliss Cheney 07/30/2019, 4:38 PM

## 2019-07-30 NOTE — Progress Notes (Signed)
  Speech Language Pathology Treatment: Dysphagia  Patient Details Name: [name] [name] MRN: [id number] DOB: 09-29-1927 Today's Date: 07/30/2019 Time: 1039-1100 SLP Time Calculation (min) (ACUTE ONLY): 21 min  Assessment / Plan / Recommendation Clinical Impression  Pt in pain when SLP arrived moaning/grimacing with hand placed on stomach throughout session; refused most oral intake, but did agree to nectar-thickened liquids via tsp with congested/wet vocal quality and delayed swallow initiation after max verbal/tactile cues given; aggressive oral care provided prior to oral intake; pt with moderate oral residue in oral cavity with coating noted on palate, tongue and throughout buccal cavities and lips; pt with hyperactive response when palate cleared with toothette.  Vocal quality improved minimally after oral care, but was not eliminated. Recommend making pt NPO for next 24 hrs as mentation decreased and risk of aspiration increased.  Pt refusing intake per nursing staff as well; at risk for nutrition/hydration concerns.  ST will f/u for po readiness for diet progression/potential repeat MBS.      HPI HPI: [name] [name] is a 86 y.o. female who presents to the ED for evaluation of left-sided weakness and intermittent expressive aphasia.  MRI showed multiple punctate strokes at left BG and corona radiata. Initial bedside swallow eval was completed 6/18. Patient reported to have worsening of right sided weakness-->now flaccid with significant deficits.; Downgraded to D2/NTL on 07/27/19.  CXR on 07/30/19 negative for acute processes.      SLP Plan  Goals updated       Recommendations  Diet recommendations: NPO Medication Administration: Via alternative means                General recommendations: Other(comment) (TBD) Oral Care Recommendations: Oral care QID Follow up Recommendations: Skilled Nursing facility SLP Visit Diagnosis: Dysphagia, oropharyngeal phase (R13.12) Plan:  Goals updated                       [name] [name], M.S., CCC-SLP 07/30/2019, 1:51 PM

## 2019-07-30 NOTE — Plan of Care (Signed)
  Problem: Coping: Goal: Will verbalize positive feelings about self Outcome: Progressing   Problem: Coping: Goal: Level of anxiety will decrease Outcome: Progressing   Problem: Pain Managment: Goal: General experience of comfort will improve Outcome: Progressing   Problem: Safety: Goal: Ability to remain free from injury will improve Outcome: Progressing

## 2019-07-31 ENCOUNTER — Inpatient Hospital Stay (HOSPITAL_COMMUNITY): Payer: Medicare HMO

## 2019-07-31 DIAGNOSIS — R1310 Dysphagia, unspecified: Secondary | ICD-10-CM

## 2019-07-31 DIAGNOSIS — Z515 Encounter for palliative care: Secondary | ICD-10-CM

## 2019-07-31 DIAGNOSIS — Z7189 Other specified counseling: Secondary | ICD-10-CM

## 2019-07-31 DIAGNOSIS — Z66 Do not resuscitate: Secondary | ICD-10-CM

## 2019-07-31 LAB — URINE CULTURE: Culture: NO GROWTH

## 2019-07-31 LAB — MRSA PCR SCREENING: MRSA by PCR: NEGATIVE

## 2019-07-31 LAB — C-REACTIVE PROTEIN: CRP: 6.5 mg/dL — ABNORMAL HIGH (ref <1.0)

## 2019-07-31 LAB — BRAIN NATRIURETIC PEPTIDE: B Natriuretic Peptide: 405 pg/mL — ABNORMAL HIGH (ref 0.0–100.0)

## 2019-07-31 LAB — PROCALCITONIN: Procalcitonin: <0.1 ng/mL

## 2019-07-31 MED ORDER — FENTANYL CITRATE (PF) 100 MCG/2ML IJ SOLN
12.5000 ug | Freq: Once | INTRAMUSCULAR | Status: AC
  Administered 2019-07-31: 12.5 ug via INTRAVENOUS
  Filled 2019-07-31: qty 2

## 2019-07-31 MED ORDER — METRONIDAZOLE IN NACL 5-0.79 MG/ML-% IV SOLN
500.0000 mg | Freq: Three times a day (TID) | INTRAVENOUS | Status: DC
  Administered 2019-07-31: 500 mg via INTRAVENOUS
  Filled 2019-07-31: qty 100

## 2019-07-31 MED ORDER — MORPHINE SULFATE (PF) 2 MG/ML IV SOLN
1.0000 mg | INTRAVENOUS | Status: DC | PRN
  Administered 2019-07-31 – 2019-08-01 (×3): 1 mg via INTRAVENOUS
  Filled 2019-07-31 (×3): qty 1

## 2019-07-31 MED ORDER — SODIUM CHLORIDE 0.9 % IV SOLN
1.5000 g | Freq: Two times a day (BID) | INTRAVENOUS | Status: DC
  Administered 2019-07-31 (×2): 1.5 g via INTRAVENOUS
  Filled 2019-07-31 (×3): qty 4
  Filled 2019-07-31: qty 1.5

## 2019-07-31 MED ORDER — MORPHINE SULFATE (PF) 2 MG/ML IV SOLN
0.5000 mg | INTRAVENOUS | Status: DC | PRN
  Administered 2019-07-31: 0.5 mg via INTRAVENOUS
  Filled 2019-07-31: qty 1

## 2019-07-31 NOTE — Plan of Care (Signed)
  Problem: Coping: Goal: Level of anxiety will decrease Outcome: Progressing   Problem: Elimination: Goal: Will not experience complications related to urinary retention Outcome: Progressing   Problem: Pain Managment: Goal: General experience of comfort will improve Outcome: Progressing   

## 2019-07-31 NOTE — Progress Notes (Signed)
Occupational Therapy Treatment Patient Details Name: [name] [name] MRN: [id number] DOB: 06/05/1927 Today's Date: 07/31/2019    History of present illness Pt is an 86 y/o female who presents with weakness and aphasia. 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   OT comments  Upon arrival pt slouched in bed alert and agreeable to skilled OT services. Pt was repositioned in the bed for comfort with Max A +2 for rolling and boost up in the bed. Pt agreeable to washed hands with max A. Inconsistently using yes and no and grimacing and moan while pointing to stomach when asked if she had pain. Also moving shoulder and grimacing while trying to reposition due to discomfort. Pt was left repositioned, falling asleep with call bell within reach. Believe d/c plans are still appropriate due to pt's inability to participate in BADLs due to decreased activity tolerance and generalized weakness.    Follow Up Recommendations  SNF;Supervision/Assistance - 24 hour;Other (comment)    Equipment Recommendations  Other (comment) (TBD at next venue of care)       Precautions / Restrictions Precautions Precautions: Fall       Mobility Bed Mobility Overal bed mobility: Needs Assistance Bed Mobility: Rolling Rolling: Max assist;+2 for physical assistance         General bed mobility comments: Max A +2 to reposition for comfort           ADL either performed or assessed with clinical judgement   ADL Overall ADL's : Needs assistance/impaired     Grooming: Wash/dry hands;Bed level;Maximal assistance                                 General ADL Comments: pt continues to present with decreased activity tolerance amd general weakness that impacts pt's ability to participate in BADLs               Cognition Arousal/Alertness: Awake/alert Behavior During Therapy: Flat  affect Overall Cognitive Status: Impaired/Different from baseline Area of Impairment: Awareness;Following commands;Attention                   Current Attention Level: Focused   Following Commands: Follows one step commands inconsistently;Follows one step commands with increased time   Awareness: Intellectual   General Comments: Pt communicated with inconsistent yes and no head nods.              General Comments Pt alert upon arrival. Pointing to stomach and groaning also moving shoulder to try and reposition due to discomfort. Agreeable to washing hands at bed level for grooming.     Pertinent Vitals/ Pain       Pain Assessment: Faces Faces Pain Scale: Hurts even more Pain Location: pointed to stomach and grimacing trying to reposition L shoulder Pain Descriptors / Indicators: Grimacing;Guarding;Moaning Pain Intervention(s): Limited activity within patient's tolerance;Monitored during session         Frequency  Min 2X/week        Progress Toward Goals  OT Goals(current goals can now be found in the care plan section)  Progress towards OT goals: Not progressing toward goals - comment  Acute Rehab OT Goals Patient Stated Goal: None specifically stated OT Goal Formulation: With patient Time For Goal Achievement: 08/03/19 Potential to Achieve Goals: Good  Plan Discharge plan remains appropriate;Frequency remains appropriate       AM-PAC  OT "6 Clicks" Daily Activity     Outcome Measure   Help from another person eating meals?: A Lot Help from another person taking care of personal grooming?: A Lot Help from another person toileting, which includes using toliet, bedpan, or urinal?: Total Help from another person bathing (including washing, rinsing, drying)?: A Lot Help from another person to put on and taking off regular upper body clothing?: Total Help from another person to put on and taking off regular lower body clothing?: Total 6 Click Score: 9     End of Session    OT Visit Diagnosis: Unsteadiness on feet (R26.81);Other abnormalities of gait and mobility (R26.89);Muscle weakness (generalized) (M62.81);Other symptoms and signs involving the nervous system (R29.898);Other symptoms and signs involving cognitive function   Activity Tolerance Patient limited by lethargy   Patient Left in bed;with call bell/phone within reach;with bed alarm set   Nurse Communication Mobility status        Time: 1415-1434 OT Time Calculation (min): 19 min  Charges: OT General Charges $OT Visit: 1 Visit OT Treatments $Self Care/Home Management : 8-22 mins  Lavern Maslow/OTS    Ayra Hodgdon 07/31/2019, 4:15 PM

## 2019-07-31 NOTE — Progress Notes (Signed)
  Speech Language Pathology Treatment: Dysphagia  Patient Details Name: [name] [name] MRN: [id number] DOB: [birth year] Today's Date: 07/31/2019 Time: 9892-1194 SLP Time Calculation (min) (ACUTE ONLY): 14 min  Assessment / Plan / Recommendation Clinical Impression  Pt sounds congested upon SLP arrival. Oral care was performed as pt would allow, but oral cavity is quite dry with a coating that cannot be adequately scrubbed away as pt is somewhat resistant to attempts. She took a few spoonfuls of nectar thick water with Mod cues needed to facilitate oral holding. Anterior spillage is also noted from her R side, and suspect a delay in swallow initiation.  After such little intake, pt declines anything further, making differential diagnosis limited. Pt remains at risk for inadequate nutrition/hydration as well as aspiration. Noted that family is following with consideration for shift to comfort if pt were to decline further. Will continue to follow for potential to resume PO diet.    HPI HPI: [name] [name] is a 86 y.o. female who presents to the ED for evaluation of left-sided weakness and intermittent expressive aphasia.  MRI showed multiple punctate strokes at left BG and corona radiata. Initial bedside swallow eval was completed 6/18. Patient reported to have worsening of right sided weakness-->now flaccid with significant deficits.      SLP Plan  Continue with current plan of care       Recommendations  Diet recommendations: NPO Medication Administration: Via alternative means                Oral Care Recommendations: Oral care QID Follow up Recommendations: Skilled Nursing facility SLP Visit Diagnosis: Dysphagia, oropharyngeal phase (R13.12) Plan: Continue with current plan of care       GO                Osie Bond., M.A. [facility] Pager [phone] Office [phone]  07/31/2019, 10:20 AM

## 2019-07-31 NOTE — Consult Note (Signed)
Consultation Note Date: 07/31/2019   Patient Name: [name] [name]  DOB: 01-May-1927  MRN: [id number]  Age / Sex: 86 y.o., female  PCP: [name] Bill Standley Brooking, MD Referring Physician: Thurnell Lose, MD  Reason for Consultation: Establishing goals of care  HPI/Patient Profile: 86 y.o. female  with past medical history of CAD s/p stenting to RCA in 2005, peripheral vascular disease, COPD, CKD stage III, hypertension, hyperlipidemia, and carotid stenosis presented on 07/19/2019 to the ED with left-sided weakness and intermittent aphasia. Initial imaging was negative for stroke, subsequently she developed right-sided weakness and follow-up MRI showed punctuate acute infarcts in the left corona radiata and basal ganglia. Neurology recommended aspirin plavix for 3 months followed by Plavix monotherapy due to severe left MCA stenosis.  Patient with dysphagia and now with suspected aspiration pneumonia, started on Unasyn 6/29.  She also has severe malnutrition--BMI of 17.6 and albumin of 2.8 (6/23).  Palliative care has been consulted to assist with goals of care.   Clinical Assessment and Goals of Care: I have reviewed medical records including EPIC notes, labs and imaging, examined the patient and met at bedside with granddaughter [name] [name]  to discuss diagnosis, prognosis, GOC, EOL wishes, disposition, and options.  I introduced Palliative Medicine as specialized medical care for people living with serious illness. It focuses on providing relief from the symptoms and stress of a serious illness.   We discussed a brief life review of the patient. [name] [name] describes her grandmother as a strong woman. She was born and raised in [place]. She has 2 daughters and 1 son. Both daughters are deceased. The son [name] [name] lives in [place] and per [name] [name] "has nothing to do with his mother". The patient divorced her first husband  while the children were young and remarried. The second husband had a daughter [name] [name] from a previous relationship.   As far as functional and nutritional status, prior to admission the patient lived alone in her home in [place]. She was able to make her own meals and ambulate with a walker. She was able to do simple housework and even yard work on occasion. [name] [name] has been very involved over the last 4 years--taking her to appointments, helping with groceries, shopping, etc.   We discussed that a major stroke is a catastrophic event for an elderly person. Discussed that the stroke has caused irreversible dysphagia. Discussed the possibility of recurrent aspiration pneumonia. Discussed current diet recommendations per SLP and concern that patient's current PO intake is not enough to sustain him/her long term. [name] [name] verbalizes that independence is an important value for her grandmother, and she would not want artificial feeding or hydration.    The difference between aggressive medical intervention and comfort care was considered in light of the patient's goals of care. [name] [name] agrees that focusing on her grandmother's comfort and dignity is the most appropriate and compassionate decision for her at this point.   Discussed that patient would likely be eligible to receive hospice care services either at home or residential. [name] [name]  is leaning toward home hospice, allowing her grandmother to be back in her own home.   Questions and concerns were addressed.  The family was encouraged to call with questions or concerns.   Primary decision maker: Should be [name] [name], as she is local and has been immediately involved in patient's life for the past 4 years. However, advanced directive from 2016 lists HCPOA as [name] [name] (step-daughter) and [name] [name] (son). I will reach out to these persons and ensure they are willing to defer medical decision making to [facility].   At bedside, I ask patient if she  wants [name] [name] to make medical decisions for her and although speech is garbled, she states yes. If I can further assess capacity, advanced directive can be negated without having to contact [name] [name] or [name] [name].    SUMMARY OF RECOMMENDATIONS   - DNR/DNI - no artificial feeding or hydration at this point - will likely transition to full comfort care after clarification of decision maker/HCPOA  Code Status/Advance Care Planning:  DNR  Palliative Prophylaxis:   Aspiration, Frequent Pain Assessment, Oral Care and Turn Reposition  Additional Recommendations (Limitations, Scope, Preferences):  No Artificial Feeding  Psycho-social/Spiritual:   Desire for further Chaplaincy support:no   Prognosis:   Poor  Discharge Planning: To Be Determined      Primary Diagnoses: Present on Admission:  Aphasia  CAD (coronary artery disease), native coronary artery  Hyperlipidemia  Essential hypertension, benign  COPD (chronic obstructive pulmonary disease) ([place])  Peripheral vascular disease ([place])   I have reviewed the medical record, interviewed the patient and family, and examined the patient. The following aspects are pertinent.  Past Medical History:  Diagnosis Date   CARCINOMA, BASAL CELL 02/13/2006   CAROTID ARTERY DISEASE 10/03/2006   Carotid US 11/17: Stable 40-59% bilateral ICA stenosis >> f/u 1 year   CHRONIC OBSTRUCTIVE PULMONARY DISEASE, ACUTE EXACERBATION 11/17/2009   Chronic rhinitis 12/13/2006   CORONARY ARTERY DISEASE 02/13/2006   DEGENERATIVE JOINT DISEASE 09/27/2006   DERMATITIS 09/25/2007   Head trauma    closed   HYPERLIPIDEMIA 03/10/2008   HYPERTENSION 02/13/2006   HYPOTHYROIDISM 01/30/2007   LIVER FUNCTION TESTS, ABNORMAL 01/22/2009   LOSS, HEARING NOS 08/25/2006   Near syncope 09/18/2014   NEOPLASM, SKIN, UNCERTAIN BEHAVIOR 99/83/3825   NEPHROLITHIASIS, HX OF 02/27/2010   OSTEOARTHRITIS, HAND 12/16/2008   PERIPHERAL VASCULAR DISEASE 02/13/2006    Personal history of malignant neoplasm of breast 07/24/2008   Personal history of urinary calculi    Post-traumatic wound infection 12/01/2010   Mild streaking superiorly and on one day of septra  Will give rocephin and add keflex  adn follow up    POSTHERPETIC NEURALGIA 08/25/2006   RENAL ARTERY STENOSIS 08/01/2008   Shingles    recurrent   SPINAL STENOSIS 11/01/2006   THROMBOCYTOPENIA 07/30/2008   UNS ADVRS EFF UNS RX MEDICINAL&BIOLOGICAL SBSTNC 01/30/2007   UNSPECIFIED ANEMIA 03/18/2008   Unspecified vitamin D deficiency 02/07/2007   UNSTABLE ANGINA 12/12/2009   Vitreous detachment    Social History   Socioeconomic History   Marital status: Single    Spouse name: Not on file   Number of children: Not on file   Years of education: 13   Highest education level: Not on file  Occupational History   Occupation: retired  Tobacco Use   Smoking status: Current Every Day Smoker    Packs/day: 1.00    Years: 30.00    Pack years: 30.00    Types: Cigarettes    Last attempt to  quit: 06/15/2010    Years since quitting: 9.1   Smokeless tobacco: Never Used  Substance and Sexual Activity   Alcohol use: No    Comment: socially   Drug use: No   Sexual activity: Not on file  Other Topics Concern   Not on file  Social History Narrative   Widowed   5 hours of sleep   Has friends checking on her   No Pets      Patient drinks about 6 cups of caffeine daily.   Patient is right handed.    Family History  Problem Relation Age of Onset   Breast cancer Daughter    Tuberculosis Father    Scheduled Meds:  amLODipine  10 mg Oral Daily   aspirin EC  325 mg Oral Daily   budesonide  1 mg Inhalation BID   clopidogrel  75 mg Oral Daily   ezetimibe  10 mg Oral Daily   guaiFENesin  1,200 mg Oral BID   heparin  5,000 Units Subcutaneous Q8H   isosorbide mononitrate  30 mg Oral Daily   metoprolol succinate  25 mg Oral Daily   multivitamin with minerals  1 tablet  Oral Daily   nutrition supplement (JUVEN)  1 packet Oral BID BM   polyethylene glycol  17 g Oral Daily   polyvinyl alcohol  1 drop Both Eyes QID   umeclidinium bromide  1 puff Inhalation Daily   Continuous Infusions:  ampicillin-sulbactam (UNASYN) IV 1.5 g (07/31/19 1012)   dextrose 5 % and 0.45% NaCl 50 mL/hr at 07/31/19 0826   PRN Meds:.acetaminophen **OR** acetaminophen (TYLENOL) oral liquid 160 mg/5 mL **OR** acetaminophen, levalbuterol, [COMPLETED] levalbuterol **FOLLOWED BY** levalbuterol, LORazepam, morphine injection, ondansetron (ZOFRAN) IV, senna-docusate, traMADol  Medications Prior to Admission:  Prior to Admission medications   Medication Sig Start Date End Date Taking? Authorizing Provider  amLODipine (NORVASC) 10 MG tablet Take 1 tablet (10 mg total) by mouth daily. 07/04/19  Yes Lelon Perla, MD  aspirin 81 MG tablet Take 81 mg by mouth daily.   Yes [provider]  carboxymethylcellulose (REFRESH PLUS) 0.5 % SOLN Place 1 drop into both eyes 4 (four) times daily.   Yes [provider]  fluticasone (FLOVENT HFA) 220 MCG/ACT inhaler Inhale 2 puffs into the lungs 2 (two) times daily. 02/05/17 07/19/19 Yes Bonnell Public, MD  isosorbide mononitrate (IMDUR) 30 MG 24 hr tablet TAKE 1 TABLET BY MOUTH DAILY Patient taking differently: Take 30 mg by mouth daily.  07/19/19  Yes Lelon Perla, MD  levalbuterol (XOPENEX HFA) 45 MCG/ACT inhaler INHALE 2 PUFFS BY MOUTH EVERY 6 HOURS AS NEEDED FOR WHEEZING Patient taking differently: Inhale 2 puffs into the lungs every 6 (six) hours as needed for wheezing.  10/15/15  Yes Rigoberto Noel, MD  metoprolol succinate (TOPROL-XL) 25 MG 24 hr tablet TAKE 1 TABLET(25 MG) BY MOUTH DAILY 07/04/19  Yes Lelon Perla, MD  tiotropium (SPIRIVA) 18 MCG inhalation capsule Place 18 mcg into inhaler and inhale daily.   Yes [provider]  traMADol (ULTRAM) 50 MG tablet Take 50 mg by mouth every 6 (six) hours as  needed for moderate pain.  11/18/16  Yes [provider]  nitroGLYCERIN (NITROSTAT) 0.4 MG SL tablet Place 1 tablet (0.4 mg total) under the tongue every 5 (five) minutes as needed for chest pain. 06/23/16 11/24/18  Lelon Perla, MD   Allergies  Allergen Reactions   Medrol [Methylprednisolone] Other (See Comments)  Reported to have hallucinations by daughter after 2 doses of Medrol has been able to take prednisone this was in the setting of pneumonia   Statins Other (See Comments)    Elevated liver enzymes   Oxycodone Anxiety and Other (See Comments)    Also states, "It makes me feel like a zombie"   Albuterol Other (See Comments)    Shakes    Hydrocodone-Acetaminophen Nausea Only    Dizziness   Tequin Other (See Comments)    Patient doesn't recall   Review of Systems  Unable to perform ROS: Patient nonverbal    Physical Exam Vitals reviewed.  Constitutional:      General: She is not in acute distress. HENT:     Head: Normocephalic and atraumatic.  Pulmonary:     Effort: Pulmonary effort is normal.  Neurological:     Mental Status: She is alert.     Motor: Weakness present.     Comments: aphasia     Vital Signs: BP (!) 148/47 (BP Location: Left Arm)    Pulse (!) 56    Temp (!) 97.4 F (36.3 C) (Axillary)    Resp 19    Ht _0  (1.626 m)    Wt 45.4 kg    SpO2 100%    BMI 17.16 kg/m  Pain Scale: Faces POSS *See Group Information*: 1-Acceptable,Awake and alert Pain Score: Asleep   SpO2: SpO2:  (o2 wouldn't read; RN Notified) O2 Device:SpO2:  (o2 wouldn't read; RN Notified) O2 Flow Rate: .O2 Flow Rate (L/min): 2 L/min  IO: Intake/output summary:   Intake/Output Summary (Last 24 hours) at 07/31/2019 1112 Last data filed at 07/31/2019 0530 Gross per 24 hour  Intake 332.51 ml  Output 350 ml  Net -17.49 ml    LBM: Last BM Date: 07/29/19 Baseline Weight: Weight: 45.4 kg Most recent weight: Weight: 45.4 kg      Palliative Assessment/Data:  20%    Time In: 12:00 Time Out: 13:10 Time Total: 70 minutes Greater than 50%  of this time was spent counseling and coordinating care related to the above assessment and plan.  Signed by: Lavena Bullion, NP   Please contact Palliative Medicine Team phone at [phone] for questions and concerns.  For individual provider: See Shea Evans

## 2019-07-31 NOTE — Progress Notes (Signed)
  Speech Language Pathology Treatment: Cognitive-Linquistic  Patient Details Name: [name] [name] MRN: [id number] DOB: 1927/02/10 Today's Date: 07/31/2019 Time: 0623-7628 SLP Time Calculation (min) (ACUTE ONLY): 13 min  Assessment / Plan / Recommendation Clinical Impression  SLP was contacted by palliative care to facilitate communication as pt/family try to establish [place]. Pt was agreeable to answering questions, answering simple yes/no questions about herself and her environment with 90% accuracy. SLP transitioned to more abstract yes/no questions but pt became more lethargic and was not responding to questions as consistently. Attempted a few simple one-step commands as well, but she was just not as alert as she had been earlier in the day. Discussed with pt, NP, and RN - plan to return on next date when pt is more alert to better establish current communication abilities to facilitate communication with family and other providers.    HPI HPI: VI BIDDINGER is a 86 y.o. female who presents to the ED for evaluation of left-sided weakness and intermittent expressive aphasia.  MRI showed multiple punctate strokes at left BG and corona radiata. Initial bedside swallow eval was completed 6/18. Patient reported to have worsening of right sided weakness-->now flaccid with significant deficits.      SLP Plan  Continue with current plan of care       Recommendations                   Oral Care Recommendations: Oral care QID Follow up Recommendations: Skilled Nursing facility SLP Visit Diagnosis: Aphasia (R47.01) Plan: Continue with current plan of care       GO                Osie Bond., M.A. [facility] Pager [phone] Office [phone]  07/31/2019, 2:56 PM

## 2019-07-31 NOTE — Progress Notes (Signed)
Pharmacy Antibiotic Note  [name] [name] is a 86 y.o. female admitted on 07/19/2019 with Aspiration Pneumonia.  Pharmacy has been consulted for unasyn dosing. Patient with Stage 3a CKD (CrCl 19 ml/min). Will adjust for renal function.   Plan: Unasyn 1.5gm IV q12h Monitor for renal function and clinical course F/u deescalation.   Height: 5\' 4"  (162.6 cm) Weight: 45.4 kg (100 lb) IBW/kg (Calculated) : 54.7  Temp (24hrs), Avg:97.9 F (36.6 C), Min:97.5 F (36.4 C), Max:98.8 F (37.1 C)  Recent Labs  Lab 07/25/19 1949 07/26/19 0908 07/27/19 0508 07/28/19 0327 07/30/19 0955  WBC  --   --   --   --  19.8*  CREATININE 1.44* 1.21* 1.25* 1.29* 1.39*    Estimated Creatinine Clearance: 19.7 mL/min (A) (by C-G formula based on SCr of 1.39 mg/dL (H)).    Allergies  Allergen Reactions  . Medrol [Methylprednisolone] Other (See Comments)    Reported to have hallucinations by daughter after 2 doses of Medrol has been able to take prednisone this was in the setting of pneumonia  . Statins Other (See Comments)    Elevated liver enzymes  . Oxycodone Anxiety and Other (See Comments)    Also states, "It makes me feel like a zombie"  . Albuterol Other (See Comments)    Shakes   . Hydrocodone-Acetaminophen Nausea Only    Dizziness  . Tequin Other (See Comments)    Patient doesn't recall    Antimicrobials this admission: 6/27 Ceftriaxone >> 6/29 6/29 Unasyn >>     Ronak Duquette A. Levada Dy, PharmD, BCPS, FNKF Clinical Pharmacist [place] Please utilize Amion for appropriate phone number to reach the unit pharmacist ([place])   07/31/2019 8:33 AM

## 2019-07-31 NOTE — Progress Notes (Signed)
[facility]                                                                              Patient Demographics  [name] [name], is a 86 y.o. female, DOB - 1927/10/10, [place] date - 07/19/2019   Admitting Physician Jennye Boroughs, MD  Outpatient Primary MD for the patient is [name], Standley Brooking, MD  Outpatient specialists:   LOS - 11  days   Medical records reviewed and are as summarized below:    No chief complaint on file.      Brief summary   Patient is a 86 year old female with history of coronary disease status post stenting to RCA in 2005, peripheral vascular disease, COPD, CKD stage IIIa, hypertension, hyperlipidemia, carotid stenosis who presented to the emergency department for the evaluation of left-sided weakness and intermittent expressive aphasia. Initial CT head/MRI of the brain did not show any stroke. Later in the hospital, she developed right-sided weakness. Repeat MRI showed acute infarct in the left corona radiataand basal ganglia. Neurology was consulted and following. Stroke workup completed. Neurology recommended dual antiplatelet therapy with aspirin Plavix for 3 months followed by Plavix monotherapy because of severe left MCA stenosis. PT/OT recommending skilled nursing facility.  Was transferred to my service on 07/31/2019 on day 11 of her hospital stay, at this time she looks clinically weak and cachectic, she has also developed a new aspiration pneumonia.  She will be transition to general medical treatment, DNR, if any further decline full comfort measures.  Discussed with patient's granddaughter on 07/31/2019 in detail.  Assessment & Plan    Acute ischemic left MCA stroke ([place]) right-sided weakness, facial droop and dysphagia.  -Presented with left-sided weakness, intermittent expressive aphasia.  Initial imaging was negative, subsequently she developed right-sided weakness and facial droop and repeat MRI showed  multiple punctate left basal ganglia and corona radiata ischemic infarct.  There was also 6 mm short segment severe stenosis involving the mid distal left M1 segment.  CTA head and neck confirmed left MCA M1 stenosis with compensation along with severe left vertebral V4 segment stenosis, moderate to severe bilateral PCA stenosis.  CTA also showed bilateral carotid bifurcation stenosis affecting 60% on the right and 55-60 on left.    Neurology was consulted and she was started on aspirin and Plavix with aspirin to be stopped after 3 months.  She was also seen by PT OT and speech, full stroke work-up was done.  Unfortunately patient started to aspirate despite full efforts from speech therapy and being on dysphagia 2 diet.  On 07/30/2018 when she developed aspiration pneumonia.  It seems she is gradually declining and her underlying conditioning is extremely poor, had detailed discussion with patient's granddaughter, focus will be gentle medical treatment if any further decline full comfort measures.  She is DNR now.  Aspiration pneumonia and UTI.  Unasyn.  N.p.o. for now.  Gentle hydration.  If any further decline full comfort measures.    COPD without any exacerbation.  Supportive care.  Hypernatremia -Resolved after hydration with D5 half-normal.  ? UTI -UA on 6/26 showed moderate leukocytes, few bacteria, WBCs 6-10, patient  was started on IV Unasyn, follow urine culture and sensitivities  Hypertension -Continue Norvasc, Toprol, Imdur  Hyperlipidemia LDL 120, has statin intolerance, continue Zetia  CAD -Status post RCA stent, no chest pain, continue current management   CKD stage IIIa: -Chronic and appears stable -Creatinine 1.2 on 6/26, was 1.4 on 6/23, follow BMET  Tobacco abuse:  -Currently smokes. Smoking cessation counseled.   Severe malnutrition:  -BMI of 17.16, now with aspiration and dysphagia, follow SLP, palliative medicine consult  Pressure injury: Pressure  Injury 07/25/19 Sacrum Medial Stage 1 -  Intact skin with non-blanchable redness of a localized area usually over a bony prominence. (Active)  07/25/19 0745  Location: Sacrum  Location Orientation: Medial  Staging: Stage 1 -  Intact skin with non-blanchable redness of a localized area usually over a bony prominence.  Wound Description (Comments):   Present on Admission:     Code Status: DNR DVT Prophylaxis:   heparin  Family Communication: Discussed her prognosis in detail with granddaughter [name] on 07/31/2019, she will be DNR, gentle medical treatment, if declines full comfort care.   Disposition Plan:     Status is: Inpatient  Remains inpatient appropriate because:Inpatient level of care appropriate due to severity of illness   Dispo: The patient is from: Home              Anticipated d/c is to: SNF              Anticipated d/c date is: 2 days              Patient currently is not medically stable to d/c.      Time Spent in minutes 35 minutes  Procedures:  MRI brain, CTA head and neck, 2D echo  Consultants:   Neurology  Palliative medicine  Antimicrobials:   Anti-infectives (From admission, onward)   Start     Dose/Rate Route Frequency Ordered Stop   07/31/19 0715  metroNIDAZOLE (FLAGYL) IVPB 500 mg  Status:  Discontinued        500 mg 100 mL/hr over 60 Minutes Intravenous Every 8 hours 07/31/19 0710 07/31/19 0826   07/30/19 0800  cefTRIAXone (ROCEPHIN) 1 g in sodium chloride 0.9 % 100 mL IVPB  Status:  Discontinued        1 g 200 mL/hr over 30 Minutes Intravenous Every 24 hours 07/29/19 0813 07/31/19 0826   07/29/19 0830  cefTRIAXone (ROCEPHIN) 1 g in sodium chloride 0.9 % 100 mL IVPB        1 g 200 mL/hr over 30 Minutes Intravenous  Once 07/29/19 0815 07/29/19 0900   07/23/19 1000  azithromycin (ZITHROMAX) tablet 500 mg        500 mg Oral Daily 07/23/19 0858 07/25/19 1013         Medications  Scheduled Meds: . amLODipine  10 mg Oral Daily  .  aspirin EC  325 mg Oral Daily  . budesonide  1 mg Inhalation BID  . clopidogrel  75 mg Oral Daily  . ezetimibe  10 mg Oral Daily  . guaiFENesin  1,200 mg Oral BID  . heparin  5,000 Units Subcutaneous Q8H  . isosorbide mononitrate  30 mg Oral Daily  . metoprolol succinate  25 mg Oral Daily  . multivitamin with minerals  1 tablet Oral Daily  . nutrition supplement (JUVEN)  1 packet Oral BID BM  . polyethylene glycol  17 g Oral Daily  . polyvinyl alcohol  1 drop Both Eyes QID  . umeclidinium  bromide  1 puff Inhalation Daily   Continuous Infusions: . dextrose 5 % and 0.45% NaCl 50 mL/hr at 07/31/19 0826   PRN Meds:.acetaminophen **OR** acetaminophen (TYLENOL) oral liquid 160 mg/5 mL **OR** acetaminophen, levalbuterol, [COMPLETED] levalbuterol **FOLLOWED BY** levalbuterol, LORazepam, morphine injection, ondansetron (ZOFRAN) IV, senna-docusate, traMADol      Subjective:   [name] [name] is in bed, appears very frail and tired, she is gradually moaning, unable to answer questions reliably but knows she is in [facility], does not know where she is hurting but states she is in pain.  Objective:   Vitals:   07/31/19 0400 07/31/19 0415 07/31/19 0500 07/31/19 0515  BP:      Pulse:      Resp: (!) 22 (!) 25 13 19   Temp:      TempSrc:      SpO2:      Weight:      Height:        Intake/Output Summary (Last 24 hours) at 07/31/2019 0827 Last data filed at 07/31/2019 0530 Gross per 24 hour  Intake 332.51 ml  Output 350 ml  Net -17.49 ml     Wt Readings from Last 3 Encounters:  07/19/19 45.4 kg  07/17/19 48 kg  07/31/18 47.6 kg     Exam  Frail elderly cachectic white female lying in hospital bed, moaning, unable to answer questions or follow commands, has right-sided weakness and facial droop, [place].AT,PERRAL Supple Neck,No JVD, No cervical lymphadenopathy appriciated.  Symmetrical Chest wall movement, Good air movement bilaterally, coarse bilateral breath sounds RRR,No  Gallops, Rubs or new Murmurs, No Parasternal Heave +ve B.Sounds, Abd Soft, No tenderness, No organomegaly appriciated, No rebound - guarding or rigidity. No Cyanosis, Clubbing or edema, No new Rash or bruise    Data Reviewed:  I have personally reviewed following labs and imaging studies  Micro Results No results found for this or any previous visit (from the past 240 hour(s)).  Radiology Reports EEG  Result Date: 07/20/2019 Lora Havens, MD     07/20/2019  3:39 PM Patient Name: [name] [name] MRN: [id number] Epilepsy Attending: Lora Havens Referring Physician/Provider: Dr. Kerney Elbe Date: 07/20/2019 Duration: 24.12 mins Patient history: 86 year old female presenting with transient spells of expressive aphasia. EEG evaluate for seizures. Level of alertness: Awake AEDs during EEG study: None Technical aspects: This EEG study was done with scalp electrodes positioned according to the 10-20 International system of electrode placement. Electrical activity was acquired at a sampling rate of 500Hz  and reviewed with a high frequency filter of 70Hz  and a low frequency filter of 1Hz . EEG data were recorded continuously and digitally stored. Description: The posterior dominant rhythm consists of 8 Hz activity of moderate voltage (25-35 uV) seen predominantly in posterior head regions, symmetric and reactive to eye opening and eye closing. EEG showed intermittent 2-3Hz  delta slowing in left temporal region.  Hyperventilation and photic stimulation were not performed.   ABNORMALITY -Intermittent slow, left temporal region IMPRESSION: This study is suggestive of non specific cortical dysfunction in left temporal region. No seizures or epileptiform discharges were seen throughout the recording. Lora Havens   CT Code Stroke CTA Head W/WO contrast  Result Date: 07/20/2019 CLINICAL DATA:  86 year old female with neurologic deficit. Unrevealing brain MRI yesterday, but MRA with severe left M1  stenosis. EXAM: CT ANGIOGRAPHY HEAD AND NECK CT PERFUSION BRAIN TECHNIQUE: Multidetector CT imaging of the head and neck was performed using the standard protocol during bolus administration of intravenous contrast.  Multiplanar CT image reconstructions and MIPs were obtained to evaluate the vascular anatomy. Carotid stenosis measurements (when applicable) are obtained utilizing NASCET criteria, using the distal internal carotid diameter as the denominator. Multiphase CT imaging of the brain was performed following IV bolus contrast injection. Subsequent parametric perfusion maps were calculated using RAPID software. CONTRAST:  56mL OMNIPAQUE IOHEXOL 350 MG/ML SOLN COMPARISON:  Brain MRI and intracranial MRA 07/19/2019. FINDINGS: CT Brain Perfusion Findings: ASPECTS: 10 CBF (<30%) Volume: None Perfusion (Tmax>6.0s) volume: None, and no significant T-max asymmetry from the left to right hemisphere. Mismatch Volume: Not applicable Infarction Location:Not applicable CT HEAD Brain: Calcified atherosclerosis at the skull base. Stable cortical encephalomalacia in the left occipital pole. Patchy bilateral white matter hypodensity is stable. Dystrophic basal ganglia calcifications again noted. No midline shift, ventriculomegaly, mass effect, evidence of mass lesion, intracranial hemorrhage or evidence of cortically based acute infarction. Calvarium and skull base: Mild chronic right orbital floor fracture. No acute osseous abnormality identified. Paranasal sinuses: Sinuses and mastoids are largely clear today. Orbits: No acute orbit or scalp soft tissue finding. CTA NECK Skeleton: Advanced degenerative changes throughout the cervical spine. No acute osseous abnormality identified. Upper chest: Centrilobular emphysema. Mild apical lung scarring. No superior mediastinal lymphadenopathy. Sequelae of right axillary node dissection. Other neck: No acute findings. Aortic arch: Fairly bulky soft and calcified plaque throughout the  aortic arch. Three vessel arch configuration. Right carotid system: Brachiocephalic artery and right CCA origin plaque without stenosis. Intermittent plaque in the right CCA without stenosis proximal to the bifurcation. Bulky calcified plaque at the carotid bifurcation and continuing into the proximal right ICA resulting in up to 60 % stenosis with respect to the distal vessel (series 7, image 103). Distal to the bulb no other right ICA plaque to the skull base. Left carotid system: Left CCA origin and other plaque without stenosis proximal to the bifurcation. Bulky calcified plaque at the left ICA origin and bulb resulting in 55-60 % stenosis with respect to the distal vessel (series 10, image 101). Mildly tortuous left ICA. Vertebral arteries: Proximal right subclavian plaque without stenosis. The right vertebral artery origin appear spared. Mild right V2 segment plaque, no right vertebral stenosis to the skull base. Proximal left subclavian artery soft and calcified plaque is extensive although less than 50 % stenosis with respect to the distal vessel results. Mild stenosis at the left vertebral artery origin. Additional mild soft plaque in the left V1 segment. Patent and slightly non dominant left vertebral artery to the skull base without significant stenosis. CTA HEAD Posterior circulation: Bulky and circumferential calcified plaque of the left V4 segment with high-grade stenosis on series 9, image 145. This is concordant with the MRA yesterday. Mild right V4 calcified plaque without significant stenosis. The left vertebral and left PICA remain patent despite the high-grade stenosis. There is moderate stenosis again at the left vertebrobasilar junction. Patent basilar artery with mild irregularity and no significant stenosis. Patent SCA and PCA origins. Posterior communicating arteries are diminutive or absent. Severe left PCA stenosis from the P2 segment on word. Moderate to severe right P2 stenosis with  better distal right PCA enhancement. This is concordant with the MRA. Anterior circulation: Both ICA siphons are patent. Calcified plaque on the left with only mild supraclinoid segment stenosis. Right side calcified plaque and tortuosity with mild supraclinoid stenosis. Patent bilateral carotid termini. Patent MCA and ACA origins. Anterior communicating artery, bilateral ACA branches, right MCA M1, and right MCA bifurcation are patent without stenosis. Mild  to moderate right M2 and M3 branch irregularities and stenosis appear similar to the MRA. Left MCA M1 demonstrates a relatively long 6 mm segment of moderate to severe stenosis best seen on series 12, image 20. This leads up to the left MCA trifurcation which remains patent. Left MCA branches demonstrate fairly symmetric enhancement to those on the right, and only mild additional irregularity. Venous sinuses: Early contrast timing, not evaluated. Anatomic variants: Mildly dominant right vertebral artery. Review of the MIP images confirms the above findings IMPRESSION: 1. Compensated Severe Left MCA M1 stenosis. CTP is negative for infarct core or penumbra. And CT appearance of the brain is stable. 2. Other intracranial CTA findings concordant to the MRA yesterday including: - Severe Left Vertebral V4 segment stenosis. - Moderate to severe bilateral PCA stenosis worse on the left. 3. Extracranial calcified plaque resulting in bilateral carotid bifurcation stenosis affecting the ICAs: 60% on the Right and 55-60% on the Left . 4. No significant cervical vertebral artery stenosis. 5. Aortic Atherosclerosis (ICD10-I70.0) and Emphysema (ICD10-J43.9). Electronically Signed   By: Genevie Ann M.D.   On: 07/20/2019 04:20   DG Chest 1 View  Result Date: 07/25/2019 CLINICAL DATA:  86 year old female with shortness of breath. EXAM: CHEST  1 VIEW COMPARISON:  Chest radiograph dated 07/24/2019. FINDINGS: No focal consolidation, pleural effusion or pneumothorax. The cardiac  silhouette is within limits. Atherosclerotic calcification of the aorta. No acute osseous pathology. Degenerative changes of the spine. IMPRESSION: No active cardiopulmonary disease. Electronically Signed   By: Anner Crete M.D.   On: 07/25/2019 21:35   DG Chest 2 View  Result Date: 07/17/2019 CLINICAL DATA:  Chest tightness since last evening, lower extremity numbness EXAM: CHEST - 2 VIEW COMPARISON:  11/23/2018 FINDINGS: Frontal and lateral views of the chest demonstrate a stable cardiac silhouette. Continued atherosclerosis of the thoracic aorta. Chronic elevation of the left hemidiaphragm. Background emphysema without airspace disease, effusion, or pneumothorax. No acute bony abnormalities. IMPRESSION: 1. Stable exam, no acute process. Electronically Signed   By: Randa Ngo M.D.   On: 07/17/2019 17:21   CT Code Stroke CTA Neck W/WO contrast  Result Date: 07/20/2019 CLINICAL DATA:  86 year old female with neurologic deficit. Unrevealing brain MRI yesterday, but MRA with severe left M1 stenosis. EXAM: CT ANGIOGRAPHY HEAD AND NECK CT PERFUSION BRAIN TECHNIQUE: Multidetector CT imaging of the head and neck was performed using the standard protocol during bolus administration of intravenous contrast. Multiplanar CT image reconstructions and MIPs were obtained to evaluate the vascular anatomy. Carotid stenosis measurements (when applicable) are obtained utilizing NASCET criteria, using the distal internal carotid diameter as the denominator. Multiphase CT imaging of the brain was performed following IV bolus contrast injection. Subsequent parametric perfusion maps were calculated using RAPID software. CONTRAST:  23mL OMNIPAQUE IOHEXOL 350 MG/ML SOLN COMPARISON:  Brain MRI and intracranial MRA 07/19/2019. FINDINGS: CT Brain Perfusion Findings: ASPECTS: 10 CBF (<30%) Volume: None Perfusion (Tmax>6.0s) volume: None, and no significant T-max asymmetry from the left to right hemisphere. Mismatch Volume: Not  applicable Infarction Location:Not applicable CT HEAD Brain: Calcified atherosclerosis at the skull base. Stable cortical encephalomalacia in the left occipital pole. Patchy bilateral white matter hypodensity is stable. Dystrophic basal ganglia calcifications again noted. No midline shift, ventriculomegaly, mass effect, evidence of mass lesion, intracranial hemorrhage or evidence of cortically based acute infarction. Calvarium and skull base: Mild chronic right orbital floor fracture. No acute osseous abnormality identified. Paranasal sinuses: Sinuses and mastoids are largely clear today. Orbits: No acute orbit  or scalp soft tissue finding. CTA NECK Skeleton: Advanced degenerative changes throughout the cervical spine. No acute osseous abnormality identified. Upper chest: Centrilobular emphysema. Mild apical lung scarring. No superior mediastinal lymphadenopathy. Sequelae of right axillary node dissection. Other neck: No acute findings. Aortic arch: Fairly bulky soft and calcified plaque throughout the aortic arch. Three vessel arch configuration. Right carotid system: Brachiocephalic artery and right CCA origin plaque without stenosis. Intermittent plaque in the right CCA without stenosis proximal to the bifurcation. Bulky calcified plaque at the carotid bifurcation and continuing into the proximal right ICA resulting in up to 60 % stenosis with respect to the distal vessel (series 7, image 103). Distal to the bulb no other right ICA plaque to the skull base. Left carotid system: Left CCA origin and other plaque without stenosis proximal to the bifurcation. Bulky calcified plaque at the left ICA origin and bulb resulting in 55-60 % stenosis with respect to the distal vessel (series 10, image 101). Mildly tortuous left ICA. Vertebral arteries: Proximal right subclavian plaque without stenosis. The right vertebral artery origin appear spared. Mild right V2 segment plaque, no right vertebral stenosis to the skull  base. Proximal left subclavian artery soft and calcified plaque is extensive although less than 50 % stenosis with respect to the distal vessel results. Mild stenosis at the left vertebral artery origin. Additional mild soft plaque in the left V1 segment. Patent and slightly non dominant left vertebral artery to the skull base without significant stenosis. CTA HEAD Posterior circulation: Bulky and circumferential calcified plaque of the left V4 segment with high-grade stenosis on series 9, image 145. This is concordant with the MRA yesterday. Mild right V4 calcified plaque without significant stenosis. The left vertebral and left PICA remain patent despite the high-grade stenosis. There is moderate stenosis again at the left vertebrobasilar junction. Patent basilar artery with mild irregularity and no significant stenosis. Patent SCA and PCA origins. Posterior communicating arteries are diminutive or absent. Severe left PCA stenosis from the P2 segment on word. Moderate to severe right P2 stenosis with better distal right PCA enhancement. This is concordant with the MRA. Anterior circulation: Both ICA siphons are patent. Calcified plaque on the left with only mild supraclinoid segment stenosis. Right side calcified plaque and tortuosity with mild supraclinoid stenosis. Patent bilateral carotid termini. Patent MCA and ACA origins. Anterior communicating artery, bilateral ACA branches, right MCA M1, and right MCA bifurcation are patent without stenosis. Mild to moderate right M2 and M3 branch irregularities and stenosis appear similar to the MRA. Left MCA M1 demonstrates a relatively long 6 mm segment of moderate to severe stenosis best seen on series 12, image 20. This leads up to the left MCA trifurcation which remains patent. Left MCA branches demonstrate fairly symmetric enhancement to those on the right, and only mild additional irregularity. Venous sinuses: Early contrast timing, not evaluated. Anatomic  variants: Mildly dominant right vertebral artery. Review of the MIP images confirms the above findings IMPRESSION: 1. Compensated Severe Left MCA M1 stenosis. CTP is negative for infarct core or penumbra. And CT appearance of the brain is stable. 2. Other intracranial CTA findings concordant to the MRA yesterday including: - Severe Left Vertebral V4 segment stenosis. - Moderate to severe bilateral PCA stenosis worse on the left. 3. Extracranial calcified plaque resulting in bilateral carotid bifurcation stenosis affecting the ICAs: 60% on the Right and 55-60% on the Left . 4. No significant cervical vertebral artery stenosis. 5. Aortic Atherosclerosis (ICD10-I70.0) and Emphysema (ICD10-J43.9). Electronically Signed  By: Genevie Ann M.D.   On: 07/20/2019 04:20   MR ANGIO HEAD WO CONTRAST  Result Date: 07/20/2019 CLINICAL DATA:  Follow-up examination for acute stroke. EXAM: MRA HEAD WITHOUT CONTRAST TECHNIQUE: Angiographic images of the Circle of Willis were obtained using MRA technique without intravenous contrast. COMPARISON:  Comparison made with prior brain MRI from earlier the same day. FINDINGS: ANTERIOR CIRCULATION: Examination mildly degraded by motion artifact. Distal cervical segments of both internal carotid arteries are widely patent with symmetric antegrade flow. Petrous, cavernous, and supraclinoid ICAs widely patent without stenosis or other abnormality. A1 segments patent. Normal anterior communicating artery complex. Anterior cerebral arteries patent to their distal aspects without stenosis. Right M1 widely patent. Normal right MCA bifurcation. Distal right MCA branches well perfused. Focal severe stenosis seen involving the mid-distal left M1 segment (series 1045, image 13). Stenosis measures approximately 6 mm in length. Left MCA bifurcation patent distally. Distal left MCA branches are perfused, although mildly attenuated as compared to the contralateral right MCA branches. POSTERIOR CIRCULATION:  Right vertebral artery dominant and widely patent to the vertebrobasilar junction. Left vertebral artery not seen as it courses into the cranial vault, and may be partially occluded. Flow related signal is seen within the distal left V4 segment with filling of the left PICA, which could be retrograde in nature across the vertebrobasilar junction. Right PICA not seen. Basilar widely patent to its distal aspect without stenosis. Superior cerebral arteries patent bilaterally. Both PCAs primarily supplied via the basilar. Atheromatous change with associated moderate bilateral P2 stenoses noted. PCAs remain patent to their distal aspects. No intracranial aneurysm. IMPRESSION: 1. 6 mm short segment severe stenosis involving the mid-distal left M1 segment. Distal left MCA branches are perfused, although attenuated as compared to the contralateral right MCA branches. 2. Nonvisualization of the left vertebral artery as it courses into the cranial vault, which may be partially occluded. Retrograde filling of the left V4 segment and left PICA across the vertebrobasilar junction. 3. Atheromatous change throughout the PCAs bilaterally with associated moderate bilateral P2 stenoses. Electronically Signed   By: Jeannine Boga M.D.   On: 07/20/2019 02:00   MR BRAIN WO CONTRAST  Result Date: 07/20/2019 CLINICAL DATA:  Stroke follow-up. Right facial droop and mild right hemiparesis. EXAM: MRI HEAD WITHOUT CONTRAST TECHNIQUE: Multiplanar, multiecho pulse sequences of the brain and surrounding structures were obtained without intravenous contrast. COMPARISON:  Head CT, CTA, and CTP 07/20/2019 and MRI 07/19/2019 FINDINGS: At the request of the ordering neurologist, only axial and coronal diffusion weighted imaging was obtained. There are new punctate acute infarcts involving the left corona radiata and superior left lentiform nucleus. No intracranial mass effect or extra-axial fluid collection is identified. Assessment of  chronic findings including chronic small vessel ischemia in the cerebral white matter is deferred to yesterday's complete brain MRI. IMPRESSION: Punctate acute infarcts in the left corona radiata and basal ganglia. Electronically Signed   By: Logan Bores M.D.   On: 07/20/2019 20:49   MR BRAIN WO CONTRAST  Result Date: 07/19/2019 CLINICAL DATA:  Ataxia. EXAM: MRI HEAD WITHOUT CONTRAST TECHNIQUE: Multiplanar, multiecho pulse sequences of the brain and surrounding structures were obtained without intravenous contrast. COMPARISON:  Head CT 06/08/2016 FINDINGS: Brain: Diffusion imaging does not show any acute or subacute infarction. There chronic small-vessel ischemic changes of the pons. No focal cerebellar stroke. There is an old left occipital cortical and subcortical infarction. There are chronic small-vessel ischemic changes of the cerebral hemispheric white matter. Old small vessel  infarction in the right thalamus. No sign of mass lesion, hemorrhage, hydrocephalus or extra-axial collection. Vascular: Major vessels at the base of the brain show flow. Skull and upper cervical spine: 2 foci of signal within the posterior parietal calvarium towards the vertex, most consistent with hemangiomas. Sinuses/Orbits: Clear/normal Other: None IMPRESSION: No acute brain finding. No abnormality seen to explain acute ataxia. Chronic small-vessel change of the pons and hemispheric white matter. 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 Left mastoid effusion. Electronically Signed   By: Nelson Chimes M.D.   On: 07/19/2019 18:52   CT Code Stroke Cerebral Perfusion with contrast  Result Date: 07/20/2019 CLINICAL DATA:  86 year old female with neurologic deficit. Unrevealing brain MRI yesterday, but MRA with severe left M1 stenosis. EXAM: CT ANGIOGRAPHY HEAD AND NECK CT PERFUSION BRAIN TECHNIQUE: Multidetector CT imaging of the head and neck was performed using the standard protocol during bolus administration of  intravenous contrast. Multiplanar CT image reconstructions and MIPs were obtained to evaluate the vascular anatomy. Carotid stenosis measurements (when applicable) are obtained utilizing NASCET criteria, using the distal internal carotid diameter as the denominator. Multiphase CT imaging of the brain was performed following IV bolus contrast injection. Subsequent parametric perfusion maps were calculated using RAPID software. CONTRAST:  23mL OMNIPAQUE IOHEXOL 350 MG/ML SOLN COMPARISON:  Brain MRI and intracranial MRA 07/19/2019. FINDINGS: CT Brain Perfusion Findings: ASPECTS: 10 CBF (<30%) Volume: None Perfusion (Tmax>6.0s) volume: None, and no significant T-max asymmetry from the left to right hemisphere. Mismatch Volume: Not applicable Infarction Location:Not applicable CT HEAD Brain: Calcified atherosclerosis at the skull base. Stable cortical encephalomalacia in the left occipital pole. Patchy bilateral white matter hypodensity is stable. Dystrophic basal ganglia calcifications again noted. No midline shift, ventriculomegaly, mass effect, evidence of mass lesion, intracranial hemorrhage or evidence of cortically based acute infarction. Calvarium and skull base: Mild chronic right orbital floor fracture. No acute osseous abnormality identified. Paranasal sinuses: Sinuses and mastoids are largely clear today. Orbits: No acute orbit or scalp soft tissue finding. CTA NECK Skeleton: Advanced degenerative changes throughout the cervical spine. No acute osseous abnormality identified. Upper chest: Centrilobular emphysema. Mild apical lung scarring. No superior mediastinal lymphadenopathy. Sequelae of right axillary node dissection. Other neck: No acute findings. Aortic arch: Fairly bulky soft and calcified plaque throughout the aortic arch. Three vessel arch configuration. Right carotid system: Brachiocephalic artery and right CCA origin plaque without stenosis. Intermittent plaque in the right CCA without stenosis  proximal to the bifurcation. Bulky calcified plaque at the carotid bifurcation and continuing into the proximal right ICA resulting in up to 60 % stenosis with respect to the distal vessel (series 7, image 103). Distal to the bulb no other right ICA plaque to the skull base. Left carotid system: Left CCA origin and other plaque without stenosis proximal to the bifurcation. Bulky calcified plaque at the left ICA origin and bulb resulting in 55-60 % stenosis with respect to the distal vessel (series 10, image 101). Mildly tortuous left ICA. Vertebral arteries: Proximal right subclavian plaque without stenosis. The right vertebral artery origin appear spared. Mild right V2 segment plaque, no right vertebral stenosis to the skull base. Proximal left subclavian artery soft and calcified plaque is extensive although less than 50 % stenosis with respect to the distal vessel results. Mild stenosis at the left vertebral artery origin. Additional mild soft plaque in the left V1 segment. Patent and slightly non dominant left vertebral artery to the skull base without significant stenosis. CTA HEAD Posterior circulation: Bulky  and circumferential calcified plaque of the left V4 segment with high-grade stenosis on series 9, image 145. This is concordant with the MRA yesterday. Mild right V4 calcified plaque without significant stenosis. The left vertebral and left PICA remain patent despite the high-grade stenosis. There is moderate stenosis again at the left vertebrobasilar junction. Patent basilar artery with mild irregularity and no significant stenosis. Patent SCA and PCA origins. Posterior communicating arteries are diminutive or absent. Severe left PCA stenosis from the P2 segment on word. Moderate to severe right P2 stenosis with better distal right PCA enhancement. This is concordant with the MRA. Anterior circulation: Both ICA siphons are patent. Calcified plaque on the left with only mild supraclinoid segment stenosis.  Right side calcified plaque and tortuosity with mild supraclinoid stenosis. Patent bilateral carotid termini. Patent MCA and ACA origins. Anterior communicating artery, bilateral ACA branches, right MCA M1, and right MCA bifurcation are patent without stenosis. Mild to moderate right M2 and M3 branch irregularities and stenosis appear similar to the MRA. Left MCA M1 demonstrates a relatively long 6 mm segment of moderate to severe stenosis best seen on series 12, image 20. This leads up to the left MCA trifurcation which remains patent. Left MCA branches demonstrate fairly symmetric enhancement to those on the right, and only mild additional irregularity. Venous sinuses: Early contrast timing, not evaluated. Anatomic variants: Mildly dominant right vertebral artery. Review of the MIP images confirms the above findings IMPRESSION: 1. Compensated Severe Left MCA M1 stenosis. CTP is negative for infarct core or penumbra. And CT appearance of the brain is stable. 2. Other intracranial CTA findings concordant to the MRA yesterday including: - Severe Left Vertebral V4 segment stenosis. - Moderate to severe bilateral PCA stenosis worse on the left. 3. Extracranial calcified plaque resulting in bilateral carotid bifurcation stenosis affecting the ICAs: 60% on the Right and 55-60% on the Left . 4. No significant cervical vertebral artery stenosis. 5. Aortic Atherosclerosis (ICD10-I70.0) and Emphysema (ICD10-J43.9). Electronically Signed   By: Genevie Ann M.D.   On: 07/20/2019 04:20   DG Chest Port 1 View  Result Date: 07/31/2019 CLINICAL DATA:  Shortness of breath. EXAM: PORTABLE CHEST 1 VIEW COMPARISON:  07/30/2019 FINDINGS: The cardiac silhouette, mediastinal and hilar contours are within normal limits. Stable tortuosity and calcification of the thoracic aorta. No acute pulmonary findings or worrisome pulmonary lesions. No pleural effusion. Mild stable emphysematous changes. Surgical changes in the right axilla.  IMPRESSION: No acute cardiopulmonary findings. Electronically Signed   By: Marijo Sanes M.D.   On: 07/31/2019 07:48   DG CHEST PORT 1 VIEW  Result Date: 07/30/2019 CLINICAL DATA:  Dysphagia. EXAM: PORTABLE CHEST 1 VIEW COMPARISON:  Chest x-ray 07/25/2019 FINDINGS: The cardiac silhouette, mediastinal and hilar contours are within normal limits and stable. Stable tortuosity and calcification of the thoracic aorta. The lungs are clear of an acute process. No pleural effusions. Surgical changes noted in the right axilla. The bony thorax is intact. IMPRESSION: No acute cardiopulmonary findings. Electronically Signed   By: Marijo Sanes M.D.   On: 07/30/2019 10:35   DG CHEST PORT 1 VIEW  Result Date: 07/24/2019 CLINICAL DATA:  Shortness of breath. EXAM: PORTABLE CHEST 1 VIEW COMPARISON:  July 21, 2019. FINDINGS: The heart size and mediastinal contours are within normal limits. No pneumothorax or pleural effusion is noted. Atherosclerosis of thoracic aorta is noted. Both lungs are clear. The visualized skeletal structures are unremarkable. IMPRESSION: No active disease. Aortic Atherosclerosis (ICD10-I70.0). Electronically Signed   By:  Marijo Conception M.D.   On: 07/24/2019 14:48   DG CHEST PORT 1 VIEW  Result Date: 07/21/2019 CLINICAL DATA:  86 year old female with bilateral crackle. EXAM: PORTABLE CHEST 1 VIEW COMPARISON:  Chest radiograph dated 07/21/2019. FINDINGS: Probable trace left pleural effusion or pleural thickening with minimal left lung base atelectasis. No focal consolidation, or pneumothorax. The cardiac silhouette is within limits. Atherosclerotic calcification of the aorta. Probable old healed left lateral rib fracture. No acute osseous pathology. Degenerative changes of the spine. Right axillary surgical clips. IMPRESSION: Probable trace left pleural effusion with minimal left lung base atelectasis. Electronically Signed   By: Anner Crete M.D.   On: 07/21/2019 21:26   DG CHEST PORT 1  VIEW  Result Date: 07/21/2019 CLINICAL DATA:  Cough. EXAM: PORTABLE CHEST 1 VIEW COMPARISON:  July 20, 2019 FINDINGS: The heart, hila, mediastinum, lungs, and pleura are unchanged and unremarkable. IMPRESSION: No active disease. Electronically Signed   By: Dorise Bullion III M.D   On: 07/21/2019 11:21   DG CHEST PORT 1 VIEW  Result Date: 07/20/2019 CLINICAL DATA:  Hypothermia EXAM: PORTABLE CHEST 1 VIEW COMPARISON:  07/19/2019 FINDINGS: There is hyperinflation of the lungs compatible with COPD. Heart is normal size. Aortic atherosclerosis. No confluent opacities or effusions. No acute bony abnormality. IMPRESSION: COPD.  No active disease. Electronically Signed   By: Rolm Baptise M.D.   On: 07/20/2019 02:27   DG Chest Port 1 View  Result Date: 07/19/2019 CLINICAL DATA:  [place] EXAM: PORTABLE CHEST 1 VIEW COMPARISON:  07/17/2019 FINDINGS: Single frontal view of the chest was performed, excluding the right costophrenic angle by collimation. The cardiac silhouette is unremarkable. Stable atherosclerosis of the aortic arch. No airspace disease, effusion, or pneumothorax. No acute bony abnormalities. IMPRESSION: 1. Stable exam, no acute process. Electronically Signed   By: Randa Ngo M.D.   On: 07/19/2019 18:31   DG Swallowing Func-Speech Pathology  Result Date: 07/24/2019 Objective Swallowing Evaluation: Type of Study: MBS-Modified Barium Swallow Study  Patient Details Name: [name] [name] MRN: [id number] Date of Birth: 17-Aug-1927 Today's Date: 07/24/2019 Time: SLP Start Time (ACUTE ONLY): 1215 -SLP Stop Time (ACUTE ONLY): 1245 SLP Time Calculation (min) (ACUTE ONLY): 30 min Past Medical History: Past Medical History: Diagnosis Date . CARCINOMA, BASAL CELL 02/13/2006 . CAROTID ARTERY DISEASE 10/03/2006  Carotid US 11/17: Stable 40-59% bilateral ICA stenosis >> f/u 1 year . CHRONIC OBSTRUCTIVE PULMONARY DISEASE, ACUTE EXACERBATION 11/17/2009 . Chronic rhinitis 12/13/2006 . CORONARY ARTERY DISEASE  02/13/2006 . DEGENERATIVE JOINT DISEASE 09/27/2006 . DERMATITIS 09/25/2007 . Head trauma   closed . HYPERLIPIDEMIA 03/10/2008 . HYPERTENSION 02/13/2006 . HYPOTHYROIDISM 01/30/2007 . LIVER FUNCTION TESTS, ABNORMAL 01/22/2009 . LOSS, HEARING NOS 08/25/2006 . Near syncope 09/18/2014 . NEOPLASM, SKIN, UNCERTAIN BEHAVIOR 66/44/0347 . NEPHROLITHIASIS, HX OF 02/27/2010 . OSTEOARTHRITIS, HAND 12/16/2008 . PERIPHERAL VASCULAR DISEASE 02/13/2006 . Personal history of malignant neoplasm of breast 07/24/2008 . Personal history of urinary calculi  . Post-traumatic wound infection 12/01/2010  Mild streaking superiorly and on one day of septra  Will give rocephin and add keflex  adn follow up  . POSTHERPETIC NEURALGIA 08/25/2006 . RENAL ARTERY STENOSIS 08/01/2008 . Shingles   recurrent . SPINAL STENOSIS 11/01/2006 . THROMBOCYTOPENIA 07/30/2008 . UNS ADVRS EFF UNS RX MEDICINAL&BIOLOGICAL SBSTNC 01/30/2007 . UNSPECIFIED ANEMIA 03/18/2008 . Unspecified vitamin D deficiency 02/07/2007 . UNSTABLE ANGINA 12/12/2009 . Vitreous detachment  Past Surgical History: Past Surgical History: Procedure Laterality Date . ABDOMINAL HYSTERECTOMY   . BREAST LUMPECTOMY   . BREAST LUMPECTOMY  1998 . CATARACT EXTRACTION   . CORONARY ANGIOPLASTY WITH STENT PLACEMENT    bilat iliac stents, left renal artery and rt coronary artery last myoview 8/09 with EF 70% . LUMBAR LAMINECTOMY   . SKIN BIOPSY  08/24/2017  Invasive squamous cell carcinoma . SKIN CANCER EXCISION   . TONSILLECTOMY   HPI: JEROME OTTER is a 86 y.o. female who presents to the ED for evaluation of left-sided weakness and intermittent expressive aphasia.  MRI showed multiple punctate strokes at left BG and corona radiata. Initial bedside swallow eval was completed 6/18. Patient reported to have worsening of right sided weakness-->now flaccid with significant deficits.  Subjective: alert Assessment / Plan / Recommendation CHL IP CLINICAL IMPRESSIONS 07/24/2019 Clinical Impression Pt presents with moderate  oropharyngeal dysphagia with intermittent aspiration of thin liquids before/during the swallow response - aspiration was not consistently accompanied by a cough response - it was occasionally silent in nature.  There was prolonged and decreased coordination of the oral phase of the swallow, with spillage from right side of oral cavity and premature spillage over base of tongue.  Aspiration was generally due to a problem with timing of laryngeal vestibule closure.  Nectar-thick liquids allowed for improved timing and no observed penetration/aspiration.  Pt had difficulty following commands for postural/strategic adjustments.  Anatomy was remarkable for degenerative changes in the cervical spine - this did not impact function. Calcified plaque in the arteries was also notable.  Recommend changing diet to dysphagia 3, nectar-thick liquids. Treatment should focus on improving coordination/sensory reception within oral cavity as well as timing of pharyngeal response.   SLP Visit Diagnosis Dysphagia, oropharyngeal phase (R13.12) Attention and concentration deficit following -- Frontal lobe and executive function deficit following -- Impact on safety and function Moderate aspiration risk   CHL IP TREATMENT RECOMMENDATION 07/24/2019 Treatment Recommendations Therapy as outlined in treatment plan below   Prognosis 07/20/2019 Prognosis for Safe Diet Advancement Good Barriers to Reach Goals -- Barriers/Prognosis Comment -- CHL IP DIET RECOMMENDATION 07/24/2019 SLP Diet Recommendations Dysphagia 3 (Mech soft) solids;Nectar thick liquid Liquid Administration via Cup Medication Administration Crushed with puree Compensations Slow rate;Small sips/bites Postural Changes Remain semi-upright after after feeds/meals (Comment)   CHL IP OTHER RECOMMENDATIONS 07/24/2019 Recommended Consults -- Oral Care Recommendations Oral care BID Other Recommendations Order thickener from pharmacy   CHL IP FOLLOW UP RECOMMENDATIONS 07/24/2019 Follow up  Recommendations Skilled Nursing facility   [facility] IP FREQUENCY AND DURATION 07/24/2019 Speech Therapy Frequency (ACUTE ONLY) min 2x/week Treatment Duration 1 week      CHL IP ORAL PHASE 07/24/2019 Oral Phase Impaired Oral - Pudding Teaspoon -- Oral - Pudding Cup -- Oral - Honey Teaspoon -- Oral - Honey Cup -- Oral - Nectar Teaspoon -- Oral - Nectar Cup Right anterior bolus loss;Weak lingual manipulation;Right pocketing in lateral sulci;Delayed oral transit;Decreased bolus cohesion Oral - Nectar Straw -- Oral - Thin Teaspoon Right anterior bolus loss;Weak lingual manipulation;Right pocketing in lateral sulci;Delayed oral transit;Decreased bolus cohesion Oral - Thin Cup Right anterior bolus loss;Weak lingual manipulation;Right pocketing in lateral sulci;Delayed oral transit;Decreased bolus cohesion Oral - Thin Straw Right anterior bolus loss;Weak lingual manipulation;Right pocketing in lateral sulci;Delayed oral transit;Decreased bolus cohesion Oral - Puree Weak lingual manipulation;Right pocketing in lateral sulci;Delayed oral transit;Decreased bolus cohesion Oral - Mech Soft -- Oral - Regular Weak lingual manipulation;Right pocketing in lateral sulci;Delayed oral transit;Decreased bolus cohesion Oral - Multi-Consistency -- Oral - Pill -- Oral Phase - Comment --  CHL IP PHARYNGEAL PHASE 07/24/2019 Pharyngeal Phase  Impaired Pharyngeal- Pudding Teaspoon -- Pharyngeal -- Pharyngeal- Pudding Cup -- Pharyngeal -- Pharyngeal- Honey Teaspoon -- Pharyngeal -- Pharyngeal- Honey Cup -- Pharyngeal -- Pharyngeal- Nectar Teaspoon -- Pharyngeal -- Pharyngeal- Nectar Cup Delayed swallow initiation-vallecula;Delayed swallow initiation-pyriform sinuses;Reduced tongue base retraction Pharyngeal -- Pharyngeal- Nectar Straw -- Pharyngeal -- Pharyngeal- Thin Teaspoon Delayed swallow initiation-pyriform sinuses Pharyngeal -- Pharyngeal- Thin Cup Delayed swallow initiation-pyriform sinuses;Reduced airway/laryngeal closure;Penetration/Aspiration  before swallow;Penetration/Aspiration during swallow;Trace aspiration;Pharyngeal residue - valleculae Pharyngeal Material enters airway, passes BELOW cords and not ejected out despite cough attempt by patient;Material enters airway, passes BELOW cords without attempt by patient to eject out (silent aspiration) Pharyngeal- Thin Straw Delayed swallow initiation-pyriform sinuses;Reduced airway/laryngeal closure;Penetration/Aspiration before swallow;Penetration/Aspiration during swallow;Trace aspiration;Pharyngeal residue - valleculae Pharyngeal Material enters airway, passes BELOW cords and not ejected out despite cough attempt by patient;Material enters airway, passes BELOW cords without attempt by patient to eject out (silent aspiration) Pharyngeal- Puree Delayed swallow initiation-vallecula;Pharyngeal residue - valleculae Pharyngeal -- Pharyngeal- Mechanical Soft -- Pharyngeal -- Pharyngeal- Regular Delayed swallow initiation-vallecula;Pharyngeal residue - valleculae Pharyngeal -- Pharyngeal- Multi-consistency -- Pharyngeal -- Pharyngeal- Pill -- Pharyngeal -- Pharyngeal Comment --  CHL IP CERVICAL ESOPHAGEAL PHASE 02/04/2017 Cervical Esophageal Phase (No Data) Pudding Teaspoon -- Pudding Cup -- Honey Teaspoon -- Honey Cup -- Nectar Teaspoon -- Nectar Cup -- Nectar Straw -- Thin Teaspoon -- Thin Cup -- Thin Straw -- Puree -- Mechanical Soft -- Regular -- Multi-consistency -- Pill -- Cervical Esophageal Comment -- Juan Quam Laurice 07/24/2019, 1:28 PM              ECHOCARDIOGRAM COMPLETE  Result Date: 07/20/2019    ECHOCARDIOGRAM REPORT   Patient Name:   DARE SPILLMAN Date of Exam: 07/20/2019 Medical Rec #:  [id number]             Height:       64.0 in Accession #:    [id number]            Weight:       100.0 lb Date of Birth:  1927/10/16            BSA:          1.457 m Patient Age:    29 years              BP:           155/63 mmHg Patient Gender: F                     HR:           62 bpm. Exam  Location:  Inpatient Procedure: 2D Echo, Cardiac Doppler and Color Doppler Indications:    TIA 435.9 / G45.9  History:        Patient has prior history of Echocardiogram examinations, most                 recent 11/16/2015. CAD, Carotid Disease; Risk                 Factors:Hypertension and Dyslipidemia. Hypothyroidism.  Sonographer:    Jonelle Sidle Dance Referring Phys: [id number] [place]  1. Left ventricular ejection fraction, by estimation, is 60 to 65%. The left ventricle has normal function. The left ventricle has no regional wall motion abnormalities. Left ventricular diastolic parameters are consistent with Grade I diastolic dysfunction (impaired relaxation). Elevated left atrial pressure.  2. Right ventricular systolic function is normal. The right ventricular size is normal. There is normal pulmonary artery  systolic pressure. The estimated right ventricular systolic pressure is 16.1 mmHg.  3. The mitral valve is normal in structure. Mild mitral valve regurgitation. No evidence of mitral stenosis.  4. Tricuspid valve regurgitation is moderate.  5. The aortic valve is normal in structure. Aortic valve regurgitation is trivial. Mild aortic valve sclerosis is present, with no evidence of aortic valve stenosis.  6. The inferior vena cava is normal in size with greater than 50% respiratory variability, suggesting right atrial pressure of 3 mmHg. FINDINGS  Left Ventricle: Left ventricular ejection fraction, by estimation, is 60 to 65%. The left ventricle has normal function. The left ventricle has no regional wall motion abnormalities. The left ventricular internal cavity size was normal in size. There is  no left ventricular hypertrophy. Left ventricular diastolic parameters are consistent with Grade I diastolic dysfunction (impaired relaxation). Elevated left atrial pressure. Right Ventricle: The right ventricular size is normal. No increase in right ventricular wall thickness. Right ventricular  systolic function is normal. There is normal pulmonary artery systolic pressure. The tricuspid regurgitant velocity is 2.30 m/s, and  with an assumed right atrial pressure of 8 mmHg, the estimated right ventricular systolic pressure is 09.6 mmHg. Left Atrium: Left atrial size was normal in size. Right Atrium: Right atrial size was normal in size. Pericardium: There is no evidence of pericardial effusion. Mitral Valve: The mitral valve is normal in structure. Normal mobility of the mitral valve leaflets. Mild mitral valve regurgitation. No evidence of mitral valve stenosis. Tricuspid Valve: The tricuspid valve is normal in structure. Tricuspid valve regurgitation is moderate . No evidence of tricuspid stenosis. Aortic Valve: The aortic valve is normal in structure. Aortic valve regurgitation is trivial. Aortic regurgitation PHT measures 503 msec. Mild aortic valve sclerosis is present, with no evidence of aortic valve stenosis. Pulmonic Valve: The pulmonic valve was normal in structure. Pulmonic valve regurgitation is not visualized. No evidence of pulmonic stenosis. Aorta: The aortic root is normal in size and structure. Venous: The inferior vena cava is normal in size with greater than 50% respiratory variability, suggesting right atrial pressure of 3 mmHg. IAS/Shunts: No atrial level shunt detected by color flow Doppler.  LEFT VENTRICLE PLAX 2D LVIDd:         3.90 cm  Diastology LVIDs:         2.70 cm  LV e' lateral:   4.40 cm/s LV PW:         0.90 cm  LV E/e' lateral: 12.4 LV IVS:        0.80 cm  LV e' medial:    3.50 cm/s LVOT diam:     2.20 cm  LV E/e' medial:  15.5 LV SV:         67 LV SV Index:   46 LVOT Area:     3.80 cm  RIGHT VENTRICLE             IVC RV Basal diam:  3.40 cm     IVC diam: 2.20 cm RV Mid diam:    2.30 cm RV S prime:     10.90 cm/s TAPSE (M-mode): 2.5 cm LEFT ATRIUM             Index       RIGHT ATRIUM           Index LA diam:        3.10 cm 2.13 cm/m  RA Area:     15.40 cm LA Vol (A2C):    19.7 ml  13.52 ml/m RA Volume:   37.60 ml  25.80 ml/m LA Vol (A4C):   23.3 ml 15.99 ml/m LA Biplane Vol: 21.6 ml 14.82 ml/m  AORTIC VALVE LVOT Vmax:   66.60 cm/s LVOT Vmean:  43.000 cm/s LVOT VTI:    0.176 m AI PHT:      503 msec  AORTA Ao Root diam: 2.80 cm Ao Asc diam:  3.50 cm MITRAL VALVE               TRICUSPID VALVE MV Area (PHT): 2.22 cm    TR Peak grad:   21.2 mmHg MV Decel Time: 342 msec    TR Vmax:        230.00 cm/s MV E velocity: 54.40 cm/s MV A velocity: 75.80 cm/s  SHUNTS MV E/A ratio:  0.72        Systemic VTI:  0.18 m                            Systemic Diam: 2.20 cm Dani Gobble Croitoru MD Electronically signed by Sanda Klein MD Signature Date/Time: 07/20/2019/10:39:24 AM    Final    VAS US CAROTID (at [facility] and [facility] only)  Result Date: 07/20/2019 Carotid Arterial Duplex Study Indications:       TIA and Carotid artery disease. Risk Factors:      Hypertension, hyperlipidemia. Comparison Study:  03/22/17 bilateral 40-59% Performing Technologist: June Leap RDMS, RVT  Examination Guidelines: A complete evaluation includes B-mode imaging, spectral Doppler, color Doppler, and power Doppler as needed of all accessible portions of each vessel. Bilateral testing is considered an integral part of a complete examination. Limited examinations for reoccurring indications may be performed as noted.  Right Carotid Findings: +----------+--------+--------+--------+--------------------------+--------+           PSV cm/sEDV cm/sStenosisPlaque Description        Comments +----------+--------+--------+--------+--------------------------+--------+ CCA Prox  57      6                                                  +----------+--------+--------+--------+--------------------------+--------+ CCA Distal73      11                                                 +----------+--------+--------+--------+--------------------------+--------+ ICA Prox  166     33      40-59%  heterogenous and irregular          +----------+--------+--------+--------+--------------------------+--------+ ICA Distal66      19                                                 +----------+--------+--------+--------+--------------------------+--------+ ECA       152     12                                                 +----------+--------+--------+--------+--------------------------+--------+ +----------+--------+-------+----------------+-------------------+  PSV cm/sEDV cmsDescribe        Arm Pressure (mmHG) +----------+--------+-------+----------------+-------------------+ [id number]             Multiphasic, WNL                    +----------+--------+-------+----------------+-------------------+ +---------+--------+--+--------+--+---------+ VertebralPSV cm/s41EDV cm/s11Antegrade +---------+--------+--+--------+--+---------+  Left Carotid Findings: +----------+--------+--------+--------+--------------------+-------------------+           PSV cm/sEDV cm/sStenosisPlaque Description  Comments            +----------+--------+--------+--------+--------------------+-------------------+ CCA Prox  64      9                                                       +----------+--------+--------+--------+--------------------+-------------------+ CCA Distal54      15                                                      +----------+--------+--------+--------+--------------------+-------------------+ ICA Prox  115     23      1-39%   calcific and        acoustic shadowing                                    irregular           may obscure higher                                                        velocities          +----------+--------+--------+--------+--------------------+-------------------+ ICA Distal96      13                                                      +----------+--------+--------+--------+--------------------+-------------------+ ECA        61      3                                                       +----------+--------+--------+--------+--------------------+-------------------+ +----------+--------+--------+--------+-------------------+           PSV cm/sEDV cm/sDescribeArm Pressure (mmHG) +----------+--------+--------+--------+-------------------+ [id number]             Stenotic                    +----------+--------+--------+--------+-------------------+ +---------+--------+--+--------+--+---------+ VertebralPSV cm/s97EDV cm/s12Antegrade +---------+--------+--+--------+--+---------+   Summary: Right Carotid: Velocities in the right ICA are consistent with a 40-59%                stenosis. Left Carotid: Velocities in the left ICA are consistent with a 1-39% stenosis. Subclavians: Left subclavian artery was stenotic. *See table(s) above for measurements  and observations.  Electronically signed by Antony Contras MD on 07/20/2019 at 10:57:45 AM.    Final     Lab Data:  CBC: Recent Labs  Lab 07/30/19 0955  WBC 19.8*  HGB 11.1*  HCT 35.8*  MCV 94.2  PLT 354   Basic Metabolic Panel: Recent Labs  Lab 07/25/19 1949 07/26/19 0908 07/27/19 0508 07/28/19 0327 07/30/19 0955  NA 145 148* 148* 149* 145  K 3.4* 3.3* 3.7 3.9 3.8  CL 111 113* 112* 115* 115*  CO2 21* 22 23 21* 17*  GLUCOSE 180* 102* 129* 106* 92  BUN 66* 58* 57* 61* 67*  CREATININE 1.44* 1.21* 1.25* 1.29* 1.39*  CALCIUM 9.5 9.5 9.7 9.5 8.7*  MG 2.5*  --   --   --   --   PHOS 3.8  --   --   --   --    GFR: Estimated Creatinine Clearance: 19.7 mL/min (A) (by C-G formula based on SCr of 1.39 mg/dL (H)). Liver Function Tests: Recent Labs  Lab 07/25/19 1949  ALBUMIN 2.8*   No results for input(s): LIPASE, AMYLASE in the last 168 hours. No results for input(s): AMMONIA in the last 168 hours. Coagulation Profile: No results for input(s): INR, PROTIME in the last 168 hours. Cardiac Enzymes: No results for input(s): CKTOTAL, CKMB,  CKMBINDEX, TROPONINI in the last 168 hours. BNP (last 3 results) No results for input(s): PROBNP in the last 8760 hours. HbA1C: No results for input(s): HGBA1C in the last 72 hours. CBG: No results for input(s): GLUCAP in the last 168 hours. Lipid Profile: No results for input(s): CHOL, HDL, LDLCALC, TRIG, CHOLHDL, LDLDIRECT in the last 72 hours. Thyroid Function Tests: No results for input(s): TSH, T4TOTAL, FREET4, T3FREE, THYROIDAB in the last 72 hours. Anemia Panel: No results for input(s): VITAMINB12, FOLATE, FERRITIN, TIBC, IRON, RETICCTPCT in the last 72 hours. Urine analysis:    Component Value Date/Time   COLORURINE AMBER (A) 07/28/2019 1524   APPEARANCEUR CLOUDY (A) 07/28/2019 1524   LABSPEC >1.046 (H) 07/28/2019 1524   PHURINE 5.0 07/28/2019 1524   GLUCOSEU NEGATIVE 07/28/2019 1524   HGBUR LARGE (A) 07/28/2019 1524   HGBUR large 02/27/2010 0951   BILIRUBINUR MODERATE (A) 07/28/2019 1524   BILIRUBINUR 1+ 08/12/2014 0937   KETONESUR NEGATIVE 07/28/2019 1524   PROTEINUR 100 (A) 07/28/2019 1524   UROBILINOGEN 0.2 08/12/2014 0937   UROBILINOGEN 0.2 09/21/2010 0236   NITRITE NEGATIVE 07/28/2019 1524   LEUKOCYTESUR MODERATE (A) 07/28/2019 1524     Lala Lund M.D. [facility] 07/31/2019, 8:27 AM   Call night coverage person covering after 7pm

## 2019-08-01 LAB — CBC WITH DIFFERENTIAL/PLATELET
Abs Immature Granulocytes: 0.38 10*3/uL — ABNORMAL HIGH (ref 0.00–0.07)
Basophils Absolute: 0 10*3/uL (ref 0.0–0.1)
Basophils Relative: 0 %
Eosinophils Absolute: 0 10*3/uL (ref 0.0–0.5)
Eosinophils Relative: 0 %
HCT: 33.3 % — ABNORMAL LOW (ref 36.0–46.0)
Hemoglobin: 10.5 g/dL — ABNORMAL LOW (ref 12.0–15.0)
Immature Granulocytes: 2 %
Lymphocytes Relative: 4 %
Lymphs Abs: 0.7 10*3/uL (ref 0.7–4.0)
MCH: 29.6 pg (ref 26.0–34.0)
MCHC: 31.5 g/dL (ref 30.0–36.0)
MCV: 93.8 fL (ref 80.0–100.0)
Monocytes Absolute: 1.3 10*3/uL — ABNORMAL HIGH (ref 0.1–1.0)
Monocytes Relative: 7 %
Neutro Abs: 15.6 10*3/uL — ABNORMAL HIGH (ref 1.7–7.7)
Neutrophils Relative %: 87 %
Platelets: 186 10*3/uL (ref 150–400)
RBC: 3.55 MIL/uL — ABNORMAL LOW (ref 3.87–5.11)
RDW: 14.2 % (ref 11.5–15.5)
WBC: 18 10*3/uL — ABNORMAL HIGH (ref 4.0–10.5)
nRBC: 0 % (ref 0.0–0.2)

## 2019-08-01 LAB — COMPREHENSIVE METABOLIC PANEL
ALT: 14 U/L (ref 0–44)
AST: 20 U/L (ref 15–41)
Albumin: 2.3 g/dL — ABNORMAL LOW (ref 3.5–5.0)
Alkaline Phosphatase: 72 U/L (ref 38–126)
Anion gap: 12 (ref 5–15)
BUN: 58 mg/dL — ABNORMAL HIGH (ref 8–23)
CO2: 17 mmol/L — ABNORMAL LOW (ref 22–32)
Calcium: 8.5 mg/dL — ABNORMAL LOW (ref 8.9–10.3)
Chloride: 123 mmol/L — ABNORMAL HIGH (ref 98–111)
Creatinine, Ser: 1 mg/dL (ref 0.44–1.00)
GFR calc Af Amer: 58 mL/min — ABNORMAL LOW (ref >60)
GFR calc non Af Amer: 50 mL/min — ABNORMAL LOW (ref >60)
Glucose, Bld: 117 mg/dL — ABNORMAL HIGH (ref 70–99)
Potassium: 3.2 mmol/L — ABNORMAL LOW (ref 3.5–5.1)
Sodium: 152 mmol/L — ABNORMAL HIGH (ref 135–145)
Total Bilirubin: 0.8 mg/dL (ref 0.3–1.2)
Total Protein: 4.9 g/dL — ABNORMAL LOW (ref 6.5–8.1)

## 2019-08-01 LAB — MAGNESIUM: Magnesium: 2.4 mg/dL (ref 1.7–2.4)

## 2019-08-01 LAB — BRAIN NATRIURETIC PEPTIDE: B Natriuretic Peptide: 438.4 pg/mL — ABNORMAL HIGH (ref 0.0–100.0)

## 2019-08-01 LAB — PROCALCITONIN: Procalcitonin: <0.1 ng/mL

## 2019-08-01 MED ORDER — BIOTENE DRY MOUTH MT LIQD: 15.0000 mL | OROMUCOSAL | Status: DC | PRN

## 2019-08-01 MED ORDER — GLYCOPYRROLATE 0.2 MG/ML IJ SOLN: 0.3000 mg | INTRAMUSCULAR | Status: DC | PRN

## 2019-08-01 MED ORDER — CHLORHEXIDINE GLUCONATE CLOTH 2 % EX PADS: 6.0000 | MEDICATED_PAD | Freq: Every day | CUTANEOUS | Status: DC

## 2019-08-01 MED ORDER — POLYVINYL ALCOHOL 1.4 % OP SOLN
1.0000 [drp] | Freq: Four times a day (QID) | OPHTHALMIC | Status: DC | PRN
  Filled 2019-08-01: qty 15

## 2019-08-01 MED ORDER — ONDANSETRON HCL 4 MG/2ML IJ SOLN: 4.0000 mg | Freq: Four times a day (QID) | INTRAMUSCULAR | Status: DC | PRN

## 2019-08-01 MED ORDER — ONDANSETRON 4 MG PO TBDP: 4.0000 mg | ORAL_TABLET | Freq: Four times a day (QID) | ORAL | Status: DC | PRN

## 2019-08-01 MED ORDER — LORAZEPAM 2 MG/ML IJ SOLN: 1.0000 mg | INTRAMUSCULAR | Status: DC | PRN

## 2019-08-01 MED ORDER — MORPHINE SULFATE (CONCENTRATE) 10 MG/0.5ML PO SOLN
5.0000 mg | ORAL | Status: DC | PRN
  Administered 2019-08-01 (×2): 5 mg via SUBLINGUAL
  Filled 2019-08-01 (×2): qty 0.5

## 2019-08-01 MED ORDER — LORAZEPAM 2 MG/ML PO CONC
1.0000 mg | ORAL | Status: DC | PRN
  Filled 2019-08-01: qty 0.5

## 2019-08-01 NOTE — TOC Progression Note (Signed)
Transition of Care [facility]) - Progression Note    Patient Details  Name: [name] [name] MRN: [id number] Date of Birth: December 05, 1927  Transition of Care [facility]) CM/SW [place], [place] Phone Number: 08/01/2019, 11:10 AM  Clinical Narrative:   CSW alerted by PMT NP that plan is now for patient to go home with hospice. CSW spoke with granddaughter Lenna Sciara to offer choice of providers, and [name] chose [facility]. CSW explained referral process and timing expectations, and Lenna Sciara will work to pack up belongings and get back to the patient's house for equipment delivery. Patient will need a hospital bed. CSW provided referral to [facility] with Lonia Chimera and they will order equipment and follow up with [name]. CSW to follow.    Expected Discharge Plan: Home w Hospice Care Barriers to Discharge: Equipment Delay  Expected Discharge Plan and Services Expected Discharge Plan: [place] arrangements for the past 2 months: Single Family Home                                       Social Determinants of Health (SDOH) Interventions    Readmission Risk Interventions No flowsheet data found.

## 2019-08-01 NOTE — Progress Notes (Signed)
[facility]                                                                              Patient Demographics  [name] [name], is a 86 y.o. female, DOB - 1927/10/14, [place] date - 07/19/2019   Admitting Physician Jennye Boroughs, MD  Outpatient Primary MD for the patient is [name], Standley Brooking, MD  Outpatient specialists:   LOS - 12  days   Medical records reviewed and are as summarized below:    No chief complaint on file.      Brief summary   Patient is a 86 year old female with history of coronary disease status post stenting to RCA in 2005, peripheral vascular disease, COPD, CKD stage IIIa, hypertension, hyperlipidemia, carotid stenosis who presented to the emergency department for the evaluation of left-sided weakness and intermittent expressive aphasia. Initial CT head/MRI of the brain did not show any stroke. Later in the hospital, she developed right-sided weakness. Repeat MRI showed acute infarct in the left corona radiataand basal ganglia. Neurology was consulted and following. Stroke workup completed. Neurology recommended dual antiplatelet therapy with aspirin Plavix for 3 months followed by Plavix monotherapy because of severe left MCA stenosis. PT/OT recommending skilled nursing facility.   Assessment & Plan    Principal Problem:   Acute ischemic left MCA stroke ([place]) -Presented with left-sided weakness, intermittent expressive aphasia. -Initial CT head, MRI of the brain did not show any CVA. -Subsequently she developed right-sided weakness and repeat MRI showed acute multiple punctate CVA in the left basal ganglia and corona radiata - MRA head showed 6 mm short segment severe stenosis involving the mid distal left M1 segment.  -CTA head and neck showed compensated severe left MCA M1 stenosis, severe left vertebral V4 segment stenosis, moderate to severe bilateral PCA stenosis. Also showed bilateral carotid bifurcation  stenosis affecting ICA: 60% on the right and 55 to 60% on the left.  -Neurology was consulted, recommended aspirin 325 mg daily and Plavix for 3 months followed by Plavix indefinitely. -Will need follow-up with [facility] neurology as outpatient upon discharge. -Hemoglobin A1c 5.6, LDL 120 -Patient unfortunately continued to aspirate despite full efforts from speech therapy, dysphagia 2 diet. -Developed new aspiration pneumonia and has been gradually declining.  Palliative medicine was consulted, with extensive goals of care discussion, patient has been now transition to full comfort care today per patient's family's wishes.  DNR   Active Problems: COPD exacerbation, aspiration pneumonia -Patient was placed on IV Unasyn, gentle hydration -Full comfort measures today  Hypernatremia -Persistent hypernatremia, sodium 152 today.  IV fluids discontinued - patient was placed on full comfort measures.  UTI -UA on 6/26 showed moderate leukocytes, few bacteria, WBCs 6-10, patient was started on IV Rocephin, urine culture showed no growth -Comfort measures  Hypertension -Patient placed on comfort measures, DC antihypertensives  Hyperlipidemia LDL 120, has statin intolerance,  -Zetia was discontinued  CAD -Status post RCA stent, no chest pain, continue current management   CKD stage IIIa: -Chronic and appears stable -Creatinine 1.2 on 6/26, was 1.4 on 6/23, follow BMET  Tobacco abuse:  -Currently smokes. Smoking cessation counseled.  Severe malnutrition:  -BMI of 17.16, now with aspiration and dysphagia, follow SLP, palliative medicine consult  Pressure injury: Pressure Injury 07/25/19 Sacrum Medial Stage 1 -  Intact skin with non-blanchable redness of a localized area usually over a bony prominence. (Active)  07/25/19 0745  Location: Sacrum  Location Orientation: Medial  Staging: Stage 1 -  Intact skin with non-blanchable redness of a localized area usually over a bony  prominence.  Wound Description (Comments):   Present on Admission:     Code Status: full code  DVT Prophylaxis:   heparin  Family Communication: Goals of care discussion with patient's family today by palliative medicine   Disposition Plan:     Status is: Inpatient  Remains inpatient appropriate because:Inpatient level of care appropriate due to severity of illness   Dispo: The patient is from: Home              Anticipated d/c is to: SNF              Anticipated d/c date is: 2 days              Patient currently is not medically stable to d/c.,  Placed on comfort measures.  Follow for disposition to home hospice versus residential hospice      Time Spent in minutes 25 minutes  Procedures:  MRI brain, CTA head and neck, 2D echo  Consultants:   Neurology  Palliative medicine  Antimicrobials:   Anti-infectives (From admission, onward)   Start     Dose/Rate Route Frequency Ordered Stop   07/31/19 0845  ampicillin-sulbactam (UNASYN) 1.5 g in sodium chloride 0.9 % 100 mL IVPB  Status:  Discontinued        1.5 g 200 mL/hr over 30 Minutes Intravenous Every 12 hours 07/31/19 0836 08/01/19 1042   07/31/19 0715  metroNIDAZOLE (FLAGYL) IVPB 500 mg  Status:  Discontinued        500 mg 100 mL/hr over 60 Minutes Intravenous Every 8 hours 07/31/19 0710 07/31/19 0826   07/30/19 0800  cefTRIAXone (ROCEPHIN) 1 g in sodium chloride 0.9 % 100 mL IVPB  Status:  Discontinued        1 g 200 mL/hr over 30 Minutes Intravenous Every 24 hours 07/29/19 0813 07/31/19 0826   07/29/19 0830  cefTRIAXone (ROCEPHIN) 1 g in sodium chloride 0.9 % 100 mL IVPB        1 g 200 mL/hr over 30 Minutes Intravenous  Once 07/29/19 0815 07/29/19 0900   07/23/19 1000  azithromycin (ZITHROMAX) tablet 500 mg        500 mg Oral Daily 07/23/19 0858 07/25/19 1013         Medications  Scheduled Meds: . budesonide  1 mg Inhalation BID  . guaiFENesin  1,200 mg Oral BID   Continuous Infusions:  PRN  Meds:.acetaminophen **OR** acetaminophen (TYLENOL) oral liquid 160 mg/5 mL **OR** acetaminophen, antiseptic oral rinse, glycopyrrolate, levalbuterol, [COMPLETED] levalbuterol **FOLLOWED BY** levalbuterol, LORazepam **OR** LORazepam, morphine CONCENTRATE, ondansetron **OR** ondansetron (ZOFRAN) IV, polyvinyl alcohol, senna-docusate      Subjective:   [name] [name] was seen and examined today.  Confused, appears uncomfortable, no fevers however still has coarse breath sounds.  Overall declining.   Objective:   Vitals:   08/01/19 0300 08/01/19 0739 08/01/19 0807 08/01/19 1200  BP: (!) 153/54 (!) 163/61    Pulse: (!) 55 61    Resp: 18 20    Temp: 97.6 F (36.4 C) 98 F (36.7 C)  TempSrc: Axillary Axillary    SpO2: 99% 98% 100% 100%  Weight:      Height:        Intake/Output Summary (Last 24 hours) at 08/01/2019 1353 Last data filed at 08/01/2019 0421 Gross per 24 hour  Intake 994.23 ml  Output 750 ml  Net 244.23 ml     Wt Readings from Last 3 Encounters:  07/19/19 45.4 kg  07/17/19 48 kg  07/31/18 47.6 kg    Physical Exam  General: Alert and awake, dysarthria, uncomfortable  Cardiovascular: S1 S2 clear, RRR. No pedal edema b/l  Respiratory: Coarse breath sounds bilaterally  Gastrointestinal: Soft, nontender, nondistended, NBS  Ext: no pedal edema bilaterally  Neuro: Does not follow commands  Musculoskeletal:   Skin: No rashes  Psych: Dysarthria, confused   Data Reviewed:  I have personally reviewed following labs and imaging studies  Micro Results Recent Results (from the past 240 hour(s))  Urine Culture     Status: None   Collection Time: 07/30/19  5:45 PM   Specimen: Urine, Random  Result Value Ref Range Status   Specimen Description URINE, RANDOM  Final   Special Requests NONE  Final   Culture   Final    NO GROWTH Performed at [facility], [address] [place], [place] [address]    Report Status 07/31/2019 FINAL  Final  MRSA  PCR Screening     Status: None   Collection Time: 07/31/19  8:50 AM   Specimen: Nasal Mucosa; Nasopharyngeal  Result Value Ref Range Status   MRSA by PCR NEGATIVE NEGATIVE Final    Comment:        The GeneXpert MRSA Assay (FDA approved for NASAL specimens only), is one component of a comprehensive MRSA colonization surveillance program. It is not intended to diagnose MRSA infection nor to guide or monitor treatment for MRSA infections. Performed at [facility], [place] [address] [place], [place] [address]     Radiology Reports EEG  Result Date: 07/20/2019 Lora Havens, MD     07/20/2019  3:39 PM Patient Name: CHARMIAN FORBIS MRN: [id number] Epilepsy Attending: Lora Havens Referring Physician/Provider: Dr. Kerney Elbe Date: 07/20/2019 Duration: 24.12 mins Patient history: 86 year old female presenting with transient spells of expressive aphasia. EEG evaluate for seizures. Level of alertness: Awake AEDs during EEG study: None Technical aspects: This EEG study was done with scalp electrodes positioned according to the 10-20 International system of electrode placement. Electrical activity was acquired at a sampling rate of 500Hz  and reviewed with a high frequency filter of 70Hz  and a low frequency filter of 1Hz . EEG data were recorded continuously and digitally stored. Description: The posterior dominant rhythm consists of 8 Hz activity of moderate voltage (25-35 uV) seen predominantly in posterior head regions, symmetric and reactive to eye opening and eye closing. EEG showed intermittent 2-3Hz  delta slowing in left temporal region.  Hyperventilation and photic stimulation were not performed.   ABNORMALITY -Intermittent slow, left temporal region IMPRESSION: This study is suggestive of non specific cortical dysfunction in left temporal region. No seizures or epileptiform discharges were seen throughout the recording. Lora Havens   CT Code Stroke CTA Head W/WO  contrast  Result Date: 07/20/2019 CLINICAL DATA:  86 year old female with neurologic deficit. Unrevealing brain MRI yesterday, but MRA with severe left M1 stenosis. EXAM: CT ANGIOGRAPHY HEAD AND NECK CT PERFUSION BRAIN TECHNIQUE: Multidetector CT imaging of the head and neck was performed using the standard protocol during bolus  administration of intravenous contrast. Multiplanar CT image reconstructions and MIPs were obtained to evaluate the vascular anatomy. Carotid stenosis measurements (when applicable) are obtained utilizing NASCET criteria, using the distal internal carotid diameter as the denominator. Multiphase CT imaging of the brain was performed following IV bolus contrast injection. Subsequent parametric perfusion maps were calculated using RAPID software. CONTRAST:  57mL OMNIPAQUE IOHEXOL 350 MG/ML SOLN COMPARISON:  Brain MRI and intracranial MRA 07/19/2019. FINDINGS: CT Brain Perfusion Findings: ASPECTS: 10 CBF (<30%) Volume: None Perfusion (Tmax>6.0s) volume: None, and no significant T-max asymmetry from the left to right hemisphere. Mismatch Volume: Not applicable Infarction Location:Not applicable CT HEAD Brain: Calcified atherosclerosis at the skull base. Stable cortical encephalomalacia in the left occipital pole. Patchy bilateral white matter hypodensity is stable. Dystrophic basal ganglia calcifications again noted. No midline shift, ventriculomegaly, mass effect, evidence of mass lesion, intracranial hemorrhage or evidence of cortically based acute infarction. Calvarium and skull base: Mild chronic right orbital floor fracture. No acute osseous abnormality identified. Paranasal sinuses: Sinuses and mastoids are largely clear today. Orbits: No acute orbit or scalp soft tissue finding. CTA NECK Skeleton: Advanced degenerative changes throughout the cervical spine. No acute osseous abnormality identified. Upper chest: Centrilobular emphysema. Mild apical lung scarring. No superior mediastinal  lymphadenopathy. Sequelae of right axillary node dissection. Other neck: No acute findings. Aortic arch: Fairly bulky soft and calcified plaque throughout the aortic arch. Three vessel arch configuration. Right carotid system: Brachiocephalic artery and right CCA origin plaque without stenosis. Intermittent plaque in the right CCA without stenosis proximal to the bifurcation. Bulky calcified plaque at the carotid bifurcation and continuing into the proximal right ICA resulting in up to 60 % stenosis with respect to the distal vessel (series 7, image 103). Distal to the bulb no other right ICA plaque to the skull base. Left carotid system: Left CCA origin and other plaque without stenosis proximal to the bifurcation. Bulky calcified plaque at the left ICA origin and bulb resulting in 55-60 % stenosis with respect to the distal vessel (series 10, image 101). Mildly tortuous left ICA. Vertebral arteries: Proximal right subclavian plaque without stenosis. The right vertebral artery origin appear spared. Mild right V2 segment plaque, no right vertebral stenosis to the skull base. Proximal left subclavian artery soft and calcified plaque is extensive although less than 50 % stenosis with respect to the distal vessel results. Mild stenosis at the left vertebral artery origin. Additional mild soft plaque in the left V1 segment. Patent and slightly non dominant left vertebral artery to the skull base without significant stenosis. CTA HEAD Posterior circulation: Bulky and circumferential calcified plaque of the left V4 segment with high-grade stenosis on series 9, image 145. This is concordant with the MRA yesterday. Mild right V4 calcified plaque without significant stenosis. The left vertebral and left PICA remain patent despite the high-grade stenosis. There is moderate stenosis again at the left vertebrobasilar junction. Patent basilar artery with mild irregularity and no significant stenosis. Patent SCA and PCA origins.  Posterior communicating arteries are diminutive or absent. Severe left PCA stenosis from the P2 segment on word. Moderate to severe right P2 stenosis with better distal right PCA enhancement. This is concordant with the MRA. Anterior circulation: Both ICA siphons are patent. Calcified plaque on the left with only mild supraclinoid segment stenosis. Right side calcified plaque and tortuosity with mild supraclinoid stenosis. Patent bilateral carotid termini. Patent MCA and ACA origins. Anterior communicating artery, bilateral ACA branches, right MCA M1, and right MCA bifurcation are  patent without stenosis. Mild to moderate right M2 and M3 branch irregularities and stenosis appear similar to the MRA. Left MCA M1 demonstrates a relatively long 6 mm segment of moderate to severe stenosis best seen on series 12, image 20. This leads up to the left MCA trifurcation which remains patent. Left MCA branches demonstrate fairly symmetric enhancement to those on the right, and only mild additional irregularity. Venous sinuses: Early contrast timing, not evaluated. Anatomic variants: Mildly dominant right vertebral artery. Review of the MIP images confirms the above findings IMPRESSION: 1. Compensated Severe Left MCA M1 stenosis. CTP is negative for infarct core or penumbra. And CT appearance of the brain is stable. 2. Other intracranial CTA findings concordant to the MRA yesterday including: - Severe Left Vertebral V4 segment stenosis. - Moderate to severe bilateral PCA stenosis worse on the left. 3. Extracranial calcified plaque resulting in bilateral carotid bifurcation stenosis affecting the ICAs: 60% on the Right and 55-60% on the Left . 4. No significant cervical vertebral artery stenosis. 5. Aortic Atherosclerosis (ICD10-I70.0) and Emphysema (ICD10-J43.9). Electronically Signed   By: Genevie Ann M.D.   On: 07/20/2019 04:20   DG Chest 1 View  Result Date: 07/25/2019 CLINICAL DATA:  86 year old female with shortness of  breath. EXAM: CHEST  1 VIEW COMPARISON:  Chest radiograph dated 07/24/2019. FINDINGS: No focal consolidation, pleural effusion or pneumothorax. The cardiac silhouette is within limits. Atherosclerotic calcification of the aorta. No acute osseous pathology. Degenerative changes of the spine. IMPRESSION: No active cardiopulmonary disease. Electronically Signed   By: Anner Crete M.D.   On: 07/25/2019 21:35   DG Chest 2 View  Result Date: 07/17/2019 CLINICAL DATA:  Chest tightness since last evening, lower extremity numbness EXAM: CHEST - 2 VIEW COMPARISON:  11/23/2018 FINDINGS: Frontal and lateral views of the chest demonstrate a stable cardiac silhouette. Continued atherosclerosis of the thoracic aorta. Chronic elevation of the left hemidiaphragm. Background emphysema without airspace disease, effusion, or pneumothorax. No acute bony abnormalities. IMPRESSION: 1. Stable exam, no acute process. Electronically Signed   By: Randa Ngo M.D.   On: 07/17/2019 17:21   CT Code Stroke CTA Neck W/WO contrast  Result Date: 07/20/2019 CLINICAL DATA:  86 year old female with neurologic deficit. Unrevealing brain MRI yesterday, but MRA with severe left M1 stenosis. EXAM: CT ANGIOGRAPHY HEAD AND NECK CT PERFUSION BRAIN TECHNIQUE: Multidetector CT imaging of the head and neck was performed using the standard protocol during bolus administration of intravenous contrast. Multiplanar CT image reconstructions and MIPs were obtained to evaluate the vascular anatomy. Carotid stenosis measurements (when applicable) are obtained utilizing NASCET criteria, using the distal internal carotid diameter as the denominator. Multiphase CT imaging of the brain was performed following IV bolus contrast injection. Subsequent parametric perfusion maps were calculated using RAPID software. CONTRAST:  19mL OMNIPAQUE IOHEXOL 350 MG/ML SOLN COMPARISON:  Brain MRI and intracranial MRA 07/19/2019. FINDINGS: CT Brain Perfusion Findings:  ASPECTS: 10 CBF (<30%) Volume: None Perfusion (Tmax>6.0s) volume: None, and no significant T-max asymmetry from the left to right hemisphere. Mismatch Volume: Not applicable Infarction Location:Not applicable CT HEAD Brain: Calcified atherosclerosis at the skull base. Stable cortical encephalomalacia in the left occipital pole. Patchy bilateral white matter hypodensity is stable. Dystrophic basal ganglia calcifications again noted. No midline shift, ventriculomegaly, mass effect, evidence of mass lesion, intracranial hemorrhage or evidence of cortically based acute infarction. Calvarium and skull base: Mild chronic right orbital floor fracture. No acute osseous abnormality identified. Paranasal sinuses: Sinuses and mastoids are largely clear today.  Orbits: No acute orbit or scalp soft tissue finding. CTA NECK Skeleton: Advanced degenerative changes throughout the cervical spine. No acute osseous abnormality identified. Upper chest: Centrilobular emphysema. Mild apical lung scarring. No superior mediastinal lymphadenopathy. Sequelae of right axillary node dissection. Other neck: No acute findings. Aortic arch: Fairly bulky soft and calcified plaque throughout the aortic arch. Three vessel arch configuration. Right carotid system: Brachiocephalic artery and right CCA origin plaque without stenosis. Intermittent plaque in the right CCA without stenosis proximal to the bifurcation. Bulky calcified plaque at the carotid bifurcation and continuing into the proximal right ICA resulting in up to 60 % stenosis with respect to the distal vessel (series 7, image 103). Distal to the bulb no other right ICA plaque to the skull base. Left carotid system: Left CCA origin and other plaque without stenosis proximal to the bifurcation. Bulky calcified plaque at the left ICA origin and bulb resulting in 55-60 % stenosis with respect to the distal vessel (series 10, image 101). Mildly tortuous left ICA. Vertebral arteries: Proximal  right subclavian plaque without stenosis. The right vertebral artery origin appear spared. Mild right V2 segment plaque, no right vertebral stenosis to the skull base. Proximal left subclavian artery soft and calcified plaque is extensive although less than 50 % stenosis with respect to the distal vessel results. Mild stenosis at the left vertebral artery origin. Additional mild soft plaque in the left V1 segment. Patent and slightly non dominant left vertebral artery to the skull base without significant stenosis. CTA HEAD Posterior circulation: Bulky and circumferential calcified plaque of the left V4 segment with high-grade stenosis on series 9, image 145. This is concordant with the MRA yesterday. Mild right V4 calcified plaque without significant stenosis. The left vertebral and left PICA remain patent despite the high-grade stenosis. There is moderate stenosis again at the left vertebrobasilar junction. Patent basilar artery with mild irregularity and no significant stenosis. Patent SCA and PCA origins. Posterior communicating arteries are diminutive or absent. Severe left PCA stenosis from the P2 segment on word. Moderate to severe right P2 stenosis with better distal right PCA enhancement. This is concordant with the MRA. Anterior circulation: Both ICA siphons are patent. Calcified plaque on the left with only mild supraclinoid segment stenosis. Right side calcified plaque and tortuosity with mild supraclinoid stenosis. Patent bilateral carotid termini. Patent MCA and ACA origins. Anterior communicating artery, bilateral ACA branches, right MCA M1, and right MCA bifurcation are patent without stenosis. Mild to moderate right M2 and M3 branch irregularities and stenosis appear similar to the MRA. Left MCA M1 demonstrates a relatively long 6 mm segment of moderate to severe stenosis best seen on series 12, image 20. This leads up to the left MCA trifurcation which remains patent. Left MCA branches demonstrate  fairly symmetric enhancement to those on the right, and only mild additional irregularity. Venous sinuses: Early contrast timing, not evaluated. Anatomic variants: Mildly dominant right vertebral artery. Review of the MIP images confirms the above findings IMPRESSION: 1. Compensated Severe Left MCA M1 stenosis. CTP is negative for infarct core or penumbra. And CT appearance of the brain is stable. 2. Other intracranial CTA findings concordant to the MRA yesterday including: - Severe Left Vertebral V4 segment stenosis. - Moderate to severe bilateral PCA stenosis worse on the left. 3. Extracranial calcified plaque resulting in bilateral carotid bifurcation stenosis affecting the ICAs: 60% on the Right and 55-60% on the Left . 4. No significant cervical vertebral artery stenosis. 5. Aortic Atherosclerosis (ICD10-I70.0) and  Emphysema (ICD10-J43.9). Electronically Signed   By: Genevie Ann M.D.   On: 07/20/2019 04:20   MR ANGIO HEAD WO CONTRAST  Result Date: 07/20/2019 CLINICAL DATA:  Follow-up examination for acute stroke. EXAM: MRA HEAD WITHOUT CONTRAST TECHNIQUE: Angiographic images of the Circle of Willis were obtained using MRA technique without intravenous contrast. COMPARISON:  Comparison made with prior brain MRI from earlier the same day. FINDINGS: ANTERIOR CIRCULATION: Examination mildly degraded by motion artifact. Distal cervical segments of both internal carotid arteries are widely patent with symmetric antegrade flow. Petrous, cavernous, and supraclinoid ICAs widely patent without stenosis or other abnormality. A1 segments patent. Normal anterior communicating artery complex. Anterior cerebral arteries patent to their distal aspects without stenosis. Right M1 widely patent. Normal right MCA bifurcation. Distal right MCA branches well perfused. Focal severe stenosis seen involving the mid-distal left M1 segment (series 1045, image 13). Stenosis measures approximately 6 mm in length. Left MCA bifurcation  patent distally. Distal left MCA branches are perfused, although mildly attenuated as compared to the contralateral right MCA branches. POSTERIOR CIRCULATION: Right vertebral artery dominant and widely patent to the vertebrobasilar junction. Left vertebral artery not seen as it courses into the cranial vault, and may be partially occluded. Flow related signal is seen within the distal left V4 segment with filling of the left PICA, which could be retrograde in nature across the vertebrobasilar junction. Right PICA not seen. Basilar widely patent to its distal aspect without stenosis. Superior cerebral arteries patent bilaterally. Both PCAs primarily supplied via the basilar. Atheromatous change with associated moderate bilateral P2 stenoses noted. PCAs remain patent to their distal aspects. No intracranial aneurysm. IMPRESSION: 1. 6 mm short segment severe stenosis involving the mid-distal left M1 segment. Distal left MCA branches are perfused, although attenuated as compared to the contralateral right MCA branches. 2. Nonvisualization of the left vertebral artery as it courses into the cranial vault, which may be partially occluded. Retrograde filling of the left V4 segment and left PICA across the vertebrobasilar junction. 3. Atheromatous change throughout the PCAs bilaterally with associated moderate bilateral P2 stenoses. Electronically Signed   By: Jeannine Boga M.D.   On: 07/20/2019 02:00   MR BRAIN WO CONTRAST  Result Date: 07/20/2019 CLINICAL DATA:  Stroke follow-up. Right facial droop and mild right hemiparesis. EXAM: MRI HEAD WITHOUT CONTRAST TECHNIQUE: Multiplanar, multiecho pulse sequences of the brain and surrounding structures were obtained without intravenous contrast. COMPARISON:  Head CT, CTA, and CTP 07/20/2019 and MRI 07/19/2019 FINDINGS: At the request of the ordering neurologist, only axial and coronal diffusion weighted imaging was obtained. There are new punctate acute infarcts  involving the left corona radiata and superior left lentiform nucleus. No intracranial mass effect or extra-axial fluid collection is identified. Assessment of chronic findings including chronic small vessel ischemia in the cerebral white matter is deferred to yesterday's complete brain MRI. IMPRESSION: Punctate acute infarcts in the left corona radiata and basal ganglia. Electronically Signed   By: Logan Bores M.D.   On: 07/20/2019 20:49   MR BRAIN WO CONTRAST  Result Date: 07/19/2019 CLINICAL DATA:  Ataxia. EXAM: MRI HEAD WITHOUT CONTRAST TECHNIQUE: Multiplanar, multiecho pulse sequences of the brain and surrounding structures were obtained without intravenous contrast. COMPARISON:  Head CT 06/08/2016 FINDINGS: Brain: Diffusion imaging does not show any acute or subacute infarction. There chronic small-vessel ischemic changes of the pons. No focal cerebellar stroke. There is an old left occipital cortical and subcortical infarction. There are chronic small-vessel ischemic changes of the cerebral  hemispheric white matter. Old small vessel infarction in the right thalamus. No sign of mass lesion, hemorrhage, hydrocephalus or extra-axial collection. Vascular: Major vessels at the base of the brain show flow. Skull and upper cervical spine: 2 foci of signal within the posterior parietal calvarium towards the vertex, most consistent with hemangiomas. Sinuses/Orbits: Clear/normal Other: None IMPRESSION: No acute brain finding. No abnormality seen to explain acute ataxia. Chronic small-vessel change of the pons and hemispheric white matter. 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 Left mastoid effusion. Electronically Signed   By: Nelson Chimes M.D.   On: 07/19/2019 18:52   CT Code Stroke Cerebral Perfusion with contrast  Result Date: 07/20/2019 CLINICAL DATA:  86 year old female with neurologic deficit. Unrevealing brain MRI yesterday, but MRA with severe left M1 stenosis. EXAM: CT ANGIOGRAPHY  HEAD AND NECK CT PERFUSION BRAIN TECHNIQUE: Multidetector CT imaging of the head and neck was performed using the standard protocol during bolus administration of intravenous contrast. Multiplanar CT image reconstructions and MIPs were obtained to evaluate the vascular anatomy. Carotid stenosis measurements (when applicable) are obtained utilizing NASCET criteria, using the distal internal carotid diameter as the denominator. Multiphase CT imaging of the brain was performed following IV bolus contrast injection. Subsequent parametric perfusion maps were calculated using RAPID software. CONTRAST:  72mL OMNIPAQUE IOHEXOL 350 MG/ML SOLN COMPARISON:  Brain MRI and intracranial MRA 07/19/2019. FINDINGS: CT Brain Perfusion Findings: ASPECTS: 10 CBF (<30%) Volume: None Perfusion (Tmax>6.0s) volume: None, and no significant T-max asymmetry from the left to right hemisphere. Mismatch Volume: Not applicable Infarction Location:Not applicable CT HEAD Brain: Calcified atherosclerosis at the skull base. Stable cortical encephalomalacia in the left occipital pole. Patchy bilateral white matter hypodensity is stable. Dystrophic basal ganglia calcifications again noted. No midline shift, ventriculomegaly, mass effect, evidence of mass lesion, intracranial hemorrhage or evidence of cortically based acute infarction. Calvarium and skull base: Mild chronic right orbital floor fracture. No acute osseous abnormality identified. Paranasal sinuses: Sinuses and mastoids are largely clear today. Orbits: No acute orbit or scalp soft tissue finding. CTA NECK Skeleton: Advanced degenerative changes throughout the cervical spine. No acute osseous abnormality identified. Upper chest: Centrilobular emphysema. Mild apical lung scarring. No superior mediastinal lymphadenopathy. Sequelae of right axillary node dissection. Other neck: No acute findings. Aortic arch: Fairly bulky soft and calcified plaque throughout the aortic arch. Three vessel arch  configuration. Right carotid system: Brachiocephalic artery and right CCA origin plaque without stenosis. Intermittent plaque in the right CCA without stenosis proximal to the bifurcation. Bulky calcified plaque at the carotid bifurcation and continuing into the proximal right ICA resulting in up to 60 % stenosis with respect to the distal vessel (series 7, image 103). Distal to the bulb no other right ICA plaque to the skull base. Left carotid system: Left CCA origin and other plaque without stenosis proximal to the bifurcation. Bulky calcified plaque at the left ICA origin and bulb resulting in 55-60 % stenosis with respect to the distal vessel (series 10, image 101). Mildly tortuous left ICA. Vertebral arteries: Proximal right subclavian plaque without stenosis. The right vertebral artery origin appear spared. Mild right V2 segment plaque, no right vertebral stenosis to the skull base. Proximal left subclavian artery soft and calcified plaque is extensive although less than 50 % stenosis with respect to the distal vessel results. Mild stenosis at the left vertebral artery origin. Additional mild soft plaque in the left V1 segment. Patent and slightly non dominant left vertebral artery to the skull base without significant  stenosis. CTA HEAD Posterior circulation: Bulky and circumferential calcified plaque of the left V4 segment with high-grade stenosis on series 9, image 145. This is concordant with the MRA yesterday. Mild right V4 calcified plaque without significant stenosis. The left vertebral and left PICA remain patent despite the high-grade stenosis. There is moderate stenosis again at the left vertebrobasilar junction. Patent basilar artery with mild irregularity and no significant stenosis. Patent SCA and PCA origins. Posterior communicating arteries are diminutive or absent. Severe left PCA stenosis from the P2 segment on word. Moderate to severe right P2 stenosis with better distal right PCA  enhancement. This is concordant with the MRA. Anterior circulation: Both ICA siphons are patent. Calcified plaque on the left with only mild supraclinoid segment stenosis. Right side calcified plaque and tortuosity with mild supraclinoid stenosis. Patent bilateral carotid termini. Patent MCA and ACA origins. Anterior communicating artery, bilateral ACA branches, right MCA M1, and right MCA bifurcation are patent without stenosis. Mild to moderate right M2 and M3 branch irregularities and stenosis appear similar to the MRA. Left MCA M1 demonstrates a relatively long 6 mm segment of moderate to severe stenosis best seen on series 12, image 20. This leads up to the left MCA trifurcation which remains patent. Left MCA branches demonstrate fairly symmetric enhancement to those on the right, and only mild additional irregularity. Venous sinuses: Early contrast timing, not evaluated. Anatomic variants: Mildly dominant right vertebral artery. Review of the MIP images confirms the above findings IMPRESSION: 1. Compensated Severe Left MCA M1 stenosis. CTP is negative for infarct core or penumbra. And CT appearance of the brain is stable. 2. Other intracranial CTA findings concordant to the MRA yesterday including: - Severe Left Vertebral V4 segment stenosis. - Moderate to severe bilateral PCA stenosis worse on the left. 3. Extracranial calcified plaque resulting in bilateral carotid bifurcation stenosis affecting the ICAs: 60% on the Right and 55-60% on the Left . 4. No significant cervical vertebral artery stenosis. 5. Aortic Atherosclerosis (ICD10-I70.0) and Emphysema (ICD10-J43.9). Electronically Signed   By: Genevie Ann M.D.   On: 07/20/2019 04:20   DG Chest Port 1 View  Result Date: 07/31/2019 CLINICAL DATA:  Shortness of breath. EXAM: PORTABLE CHEST 1 VIEW COMPARISON:  07/30/2019 FINDINGS: The cardiac silhouette, mediastinal and hilar contours are within normal limits. Stable tortuosity and calcification of the thoracic  aorta. No acute pulmonary findings or worrisome pulmonary lesions. No pleural effusion. Mild stable emphysematous changes. Surgical changes in the right axilla. IMPRESSION: No acute cardiopulmonary findings. Electronically Signed   By: Marijo Sanes M.D.   On: 07/31/2019 07:48   DG CHEST PORT 1 VIEW  Result Date: 07/30/2019 CLINICAL DATA:  Dysphagia. EXAM: PORTABLE CHEST 1 VIEW COMPARISON:  Chest x-ray 07/25/2019 FINDINGS: The cardiac silhouette, mediastinal and hilar contours are within normal limits and stable. Stable tortuosity and calcification of the thoracic aorta. The lungs are clear of an acute process. No pleural effusions. Surgical changes noted in the right axilla. The bony thorax is intact. IMPRESSION: No acute cardiopulmonary findings. Electronically Signed   By: Marijo Sanes M.D.   On: 07/30/2019 10:35   DG CHEST PORT 1 VIEW  Result Date: 07/24/2019 CLINICAL DATA:  Shortness of breath. EXAM: PORTABLE CHEST 1 VIEW COMPARISON:  July 21, 2019. FINDINGS: The heart size and mediastinal contours are within normal limits. No pneumothorax or pleural effusion is noted. Atherosclerosis of thoracic aorta is noted. Both lungs are clear. The visualized skeletal structures are unremarkable. IMPRESSION: No active disease. Aortic Atherosclerosis (  ICD10-I70.0). Electronically Signed   By: Marijo Conception M.D.   On: 07/24/2019 14:48   DG CHEST PORT 1 VIEW  Result Date: 07/21/2019 CLINICAL DATA:  86 year old female with bilateral crackle. EXAM: PORTABLE CHEST 1 VIEW COMPARISON:  Chest radiograph dated 07/21/2019. FINDINGS: Probable trace left pleural effusion or pleural thickening with minimal left lung base atelectasis. No focal consolidation, or pneumothorax. The cardiac silhouette is within limits. Atherosclerotic calcification of the aorta. Probable old healed left lateral rib fracture. No acute osseous pathology. Degenerative changes of the spine. Right axillary surgical clips. IMPRESSION: Probable  trace left pleural effusion with minimal left lung base atelectasis. Electronically Signed   By: Anner Crete M.D.   On: 07/21/2019 21:26   DG CHEST PORT 1 VIEW  Result Date: 07/21/2019 CLINICAL DATA:  Cough. EXAM: PORTABLE CHEST 1 VIEW COMPARISON:  July 20, 2019 FINDINGS: The heart, hila, mediastinum, lungs, and pleura are unchanged and unremarkable. IMPRESSION: No active disease. Electronically Signed   By: Dorise Bullion III M.D   On: 07/21/2019 11:21   DG CHEST PORT 1 VIEW  Result Date: 07/20/2019 CLINICAL DATA:  Hypothermia EXAM: PORTABLE CHEST 1 VIEW COMPARISON:  07/19/2019 FINDINGS: There is hyperinflation of the lungs compatible with COPD. Heart is normal size. Aortic atherosclerosis. No confluent opacities or effusions. No acute bony abnormality. IMPRESSION: COPD.  No active disease. Electronically Signed   By: Rolm Baptise M.D.   On: 07/20/2019 02:27   DG Chest Port 1 View  Result Date: 07/19/2019 CLINICAL DATA:  Rales EXAM: PORTABLE CHEST 1 VIEW COMPARISON:  07/17/2019 FINDINGS: Single frontal view of the chest was performed, excluding the right costophrenic angle by collimation. The cardiac silhouette is unremarkable. Stable atherosclerosis of the aortic arch. No airspace disease, effusion, or pneumothorax. No acute bony abnormalities. IMPRESSION: 1. Stable exam, no acute process. Electronically Signed   By: Randa Ngo M.D.   On: 07/19/2019 18:31   DG Swallowing Func-Speech Pathology  Result Date: 07/24/2019 Objective Swallowing Evaluation: Type of Study: MBS-Modified Barium Swallow Study  Patient Details Name: [name] [name] MRN: [id number] Date of Birth: 19-May-1927 Today's Date: 07/24/2019 Time: SLP Start Time (ACUTE ONLY): 1215 -SLP Stop Time (ACUTE ONLY): 1245 SLP Time Calculation (min) (ACUTE ONLY): 30 min Past Medical History: Past Medical History: Diagnosis Date . CARCINOMA, BASAL CELL 02/13/2006 . CAROTID ARTERY DISEASE 10/03/2006  Carotid US 11/17: Stable 40-59%  bilateral ICA stenosis >> f/u 1 year . CHRONIC OBSTRUCTIVE PULMONARY DISEASE, ACUTE EXACERBATION 11/17/2009 . Chronic rhinitis 12/13/2006 . CORONARY ARTERY DISEASE 02/13/2006 . DEGENERATIVE JOINT DISEASE 09/27/2006 . DERMATITIS 09/25/2007 . Head trauma   closed . HYPERLIPIDEMIA 03/10/2008 . HYPERTENSION 02/13/2006 . HYPOTHYROIDISM 01/30/2007 . LIVER FUNCTION TESTS, ABNORMAL 01/22/2009 . LOSS, HEARING NOS 08/25/2006 . Near syncope 09/18/2014 . NEOPLASM, SKIN, UNCERTAIN BEHAVIOR 95/62/1308 . NEPHROLITHIASIS, HX OF 02/27/2010 . OSTEOARTHRITIS, HAND 12/16/2008 . PERIPHERAL VASCULAR DISEASE 02/13/2006 . Personal history of malignant neoplasm of breast 07/24/2008 . Personal history of urinary calculi  . Post-traumatic wound infection 12/01/2010  Mild streaking superiorly and on one day of septra  Will give rocephin and add keflex  adn follow up  . POSTHERPETIC NEURALGIA 08/25/2006 . RENAL ARTERY STENOSIS 08/01/2008 . Shingles   recurrent . SPINAL STENOSIS 11/01/2006 . THROMBOCYTOPENIA 07/30/2008 . UNS ADVRS EFF UNS RX MEDICINAL&BIOLOGICAL SBSTNC 01/30/2007 . UNSPECIFIED ANEMIA 03/18/2008 . Unspecified vitamin D deficiency 02/07/2007 . UNSTABLE ANGINA 12/12/2009 . Vitreous detachment  Past Surgical History: Past Surgical History: Procedure Laterality Date . ABDOMINAL HYSTERECTOMY   . BREAST  LUMPECTOMY   . BREAST LUMPECTOMY  1998 . CATARACT EXTRACTION   . CORONARY ANGIOPLASTY WITH STENT PLACEMENT    bilat iliac stents, left renal artery and rt coronary artery last myoview 8/09 with EF 70% . LUMBAR LAMINECTOMY   . SKIN BIOPSY  08/24/2017  Invasive squamous cell carcinoma . SKIN CANCER EXCISION   . TONSILLECTOMY   HPI: [name] [name] is a 86 y.o. female who presents to the ED for evaluation of left-sided weakness and intermittent expressive aphasia.  MRI showed multiple punctate strokes at left BG and corona radiata. Initial bedside swallow eval was completed 6/18. Patient reported to have worsening of right sided weakness-->now flaccid  with significant deficits.  Subjective: alert Assessment / Plan / Recommendation CHL IP CLINICAL IMPRESSIONS 07/24/2019 Clinical Impression Pt presents with moderate oropharyngeal dysphagia with intermittent aspiration of thin liquids before/during the swallow response - aspiration was not consistently accompanied by a cough response - it was occasionally silent in nature.  There was prolonged and decreased coordination of the oral phase of the swallow, with spillage from right side of oral cavity and premature spillage over base of tongue.  Aspiration was generally due to a problem with timing of laryngeal vestibule closure.  Nectar-thick liquids allowed for improved timing and no observed penetration/aspiration.  Pt had difficulty following commands for postural/strategic adjustments.  Anatomy was remarkable for degenerative changes in the cervical spine - this did not impact function. Calcified plaque in the arteries was also notable.  Recommend changing diet to dysphagia 3, nectar-thick liquids. Treatment should focus on improving coordination/sensory reception within oral cavity as well as timing of pharyngeal response.   SLP Visit Diagnosis Dysphagia, oropharyngeal phase (R13.12) Attention and concentration deficit following -- Frontal lobe and executive function deficit following -- Impact on safety and function Moderate aspiration risk   [facility] IP TREATMENT RECOMMENDATION 07/24/2019 Treatment Recommendations Therapy as outlined in treatment plan below   Prognosis 07/20/2019 Prognosis for Safe Diet Advancement Good Barriers to Reach Goals -- Barriers/Prognosis Comment -- [facility] IP DIET RECOMMENDATION 07/24/2019 SLP Diet Recommendations Dysphagia 3 (Mech soft) solids;Nectar thick liquid Liquid Administration via Cup Medication Administration Crushed with puree Compensations Slow rate;Small sips/bites Postural Changes Remain semi-upright after after feeds/meals (Comment)   [facility] IP OTHER RECOMMENDATIONS 07/24/2019  Recommended Consults -- Oral Care Recommendations Oral care BID Other Recommendations Order thickener from pharmacy   [facility] IP FOLLOW UP RECOMMENDATIONS 07/24/2019 Follow up Recommendations Skilled Nursing facility   [facility] IP FREQUENCY AND DURATION 07/24/2019 Speech Therapy Frequency (ACUTE ONLY) min 2x/week Treatment Duration 1 week      CH[facility] IP ORAL PHASE 07/24/2019 Oral Phase Impaired Oral - Pudding Teaspoon -- Oral - Pudding Cup -- Oral - Honey Teaspoon -- Oral - Honey Cup -- Oral - Nectar Teaspoon -- Oral - Nectar Cup Right anterior bolus loss;Weak lingual manipulation;Right pocketing in lateral sulci;Delayed oral transit;Decreased bolus cohesion Oral - Nectar Straw -- Oral - Thin Teaspoon Right anterior bolus loss;Weak lingual manipulation;Right pocketing in lateral sulci;Delayed oral transit;Decreased bolus cohesion Oral - Thin Cup Right anterior bolus loss;Weak lingual manipulation;Right pocketing in lateral sulci;Delayed oral transit;Decreased bolus cohesion Oral - Thin Straw Right anterior bolus loss;Weak lingual manipulation;Right pocketing in lateral sulci;Delayed oral transit;Decreased bolus cohesion Oral - Puree Weak lingual manipulation;Right pocketing in lateral sulci;Delayed oral transit;Decreased bolus cohesion Oral - Mech Soft -- Oral - Regular Weak lingual manipulation;Right pocketing in lateral sulci;Delayed oral transit;Decreased bolus cohesion Oral - Multi-Consistency -- Oral - Pill -- Oral Phase - Comment --  [facility] IP PHARYNGEAL PHASE 07/24/2019 Pharyngeal Phase Impaired Pharyngeal- Pudding Teaspoon -- Pharyngeal -- Pharyngeal- Pudding Cup -- Pharyngeal -- Pharyngeal- Honey Teaspoon -- Pharyngeal -- Pharyngeal- Honey Cup -- Pharyngeal -- Pharyngeal- Nectar Teaspoon -- Pharyngeal -- Pharyngeal- Nectar Cup Delayed swallow initiation-vallecula;Delayed swallow initiation-pyriform sinuses;Reduced tongue base retraction Pharyngeal -- Pharyngeal- Nectar Straw -- Pharyngeal -- Pharyngeal- Thin Teaspoon Delayed  swallow initiation-pyriform sinuses Pharyngeal -- Pharyngeal- Thin Cup Delayed swallow initiation-pyriform sinuses;Reduced airway/laryngeal closure;Penetration/Aspiration before swallow;Penetration/Aspiration during swallow;Trace aspiration;Pharyngeal residue - valleculae Pharyngeal Material enters airway, passes BELOW cords and not ejected out despite cough attempt by patient;Material enters airway, passes BELOW cords without attempt by patient to eject out (silent aspiration) Pharyngeal- Thin Straw Delayed swallow initiation-pyriform sinuses;Reduced airway/laryngeal closure;Penetration/Aspiration before swallow;Penetration/Aspiration during swallow;Trace aspiration;Pharyngeal residue - valleculae Pharyngeal Material enters airway, passes BELOW cords and not ejected out despite cough attempt by patient;Material enters airway, passes BELOW cords without attempt by patient to eject out (silent aspiration) Pharyngeal- Puree Delayed swallow initiation-vallecula;Pharyngeal residue - valleculae Pharyngeal -- Pharyngeal- Mechanical Soft -- Pharyngeal -- Pharyngeal- Regular Delayed swallow initiation-vallecula;Pharyngeal residue - valleculae Pharyngeal -- Pharyngeal- Multi-consistency -- Pharyngeal -- Pharyngeal- Pill -- Pharyngeal -- Pharyngeal Comment --  [facility] IP CERVICAL ESOPHAGEAL PHASE 02/04/2017 Cervical Esophageal Phase (No Data) Pudding Teaspoon -- Pudding Cup -- Honey Teaspoon -- Honey Cup -- Nectar Teaspoon -- Nectar Cup -- Nectar Straw -- Thin Teaspoon -- Thin Cup -- Thin Straw -- Puree -- Mechanical Soft -- Regular -- Multi-consistency -- Pill -- Cervical Esophageal Comment -- Juan Quam Laurice 07/24/2019, 1:28 PM              ECHOCARDIOGRAM COMPLETE  Result Date: 07/20/2019    ECHOCARDIOGRAM REPORT   Patient Name:   KRISTIANE MORSCH Date of Exam: 07/20/2019 Medical Rec #:  [id number]             Height:       64.0 in Accession #:    [id number]            Weight:       100.0 lb Date of Birth:   1927/04/02            BSA:          1.457 m Patient Age:    24 years              BP:           155/63 mmHg Patient Gender: F                     HR:           62 bpm. Exam Location:  Inpatient Procedure: 2D Echo, Cardiac Doppler and Color Doppler Indications:    TIA 435.9 / G45.9  History:        Patient has prior history of Echocardiogram examinations, most                 recent 11/16/2015. CAD, Carotid Disease; Risk                 Factors:Hypertension and Dyslipidemia. Hypothyroidism.  Sonographer:    Jonelle Sidle Dance Referring Phys: [id number] [place]  1. Left ventricular ejection fraction, by estimation, is 60 to 65%. The left ventricle has normal function. The left ventricle has no regional wall motion abnormalities. Left ventricular diastolic parameters are consistent with Grade I diastolic dysfunction (impaired relaxation). Elevated left atrial pressure.  2. Right ventricular systolic function is normal. The right ventricular size  is normal. There is normal pulmonary artery systolic pressure. The estimated right ventricular systolic pressure is 17.4 mmHg.  3. The mitral valve is normal in structure. Mild mitral valve regurgitation. No evidence of mitral stenosis.  4. Tricuspid valve regurgitation is moderate.  5. The aortic valve is normal in structure. Aortic valve regurgitation is trivial. Mild aortic valve sclerosis is present, with no evidence of aortic valve stenosis.  6. The inferior vena cava is normal in size with greater than 50% respiratory variability, suggesting right atrial pressure of 3 mmHg. FINDINGS  Left Ventricle: Left ventricular ejection fraction, by estimation, is 60 to 65%. The left ventricle has normal function. The left ventricle has no regional wall motion abnormalities. The left ventricular internal cavity size was normal in size. There is  no left ventricular hypertrophy. Left ventricular diastolic parameters are consistent with Grade I diastolic dysfunction  (impaired relaxation). Elevated left atrial pressure. Right Ventricle: The right ventricular size is normal. No increase in right ventricular wall thickness. Right ventricular systolic function is normal. There is normal pulmonary artery systolic pressure. The tricuspid regurgitant velocity is 2.30 m/s, and  with an assumed right atrial pressure of 8 mmHg, the estimated right ventricular systolic pressure is 94.4 mmHg. Left Atrium: Left atrial size was normal in size. Right Atrium: Right atrial size was normal in size. Pericardium: There is no evidence of pericardial effusion. Mitral Valve: The mitral valve is normal in structure. Normal mobility of the mitral valve leaflets. Mild mitral valve regurgitation. No evidence of mitral valve stenosis. Tricuspid Valve: The tricuspid valve is normal in structure. Tricuspid valve regurgitation is moderate . No evidence of tricuspid stenosis. Aortic Valve: The aortic valve is normal in structure. Aortic valve regurgitation is trivial. Aortic regurgitation PHT measures 503 msec. Mild aortic valve sclerosis is present, with no evidence of aortic valve stenosis. Pulmonic Valve: The pulmonic valve was normal in structure. Pulmonic valve regurgitation is not visualized. No evidence of pulmonic stenosis. Aorta: The aortic root is normal in size and structure. Venous: The inferior vena cava is normal in size with greater than 50% respiratory variability, suggesting right atrial pressure of 3 mmHg. IAS/Shunts: No atrial level shunt detected by color flow Doppler.  LEFT VENTRICLE PLAX 2D LVIDd:         3.90 cm  Diastology LVIDs:         2.70 cm  LV e' lateral:   4.40 cm/s LV PW:         0.90 cm  LV E/e' lateral: 12.4 LV IVS:        0.80 cm  LV e' medial:    3.50 cm/s LVOT diam:     2.20 cm  LV E/e' medial:  15.5 LV SV:         67 LV SV Index:   46 LVOT Area:     3.80 cm  RIGHT VENTRICLE             IVC RV Basal diam:  3.40 cm     IVC diam: 2.20 cm RV Mid diam:    2.30 cm RV S prime:      10.90 cm/s TAPSE (M-mode): 2.5 cm LEFT ATRIUM             Index       RIGHT ATRIUM           Index LA diam:        3.10 cm 2.13 cm/m  RA Area:     15.40 cm  LA Vol (A2C):   19.7 ml 13.52 ml/m RA Volume:   37.60 ml  25.80 ml/m LA Vol (A4C):   23.3 ml 15.99 ml/m LA Biplane Vol: 21.6 ml 14.82 ml/m  AORTIC VALVE LVOT Vmax:   66.60 cm/s LVOT Vmean:  43.000 cm/s LVOT VTI:    0.176 m AI PHT:      503 msec  AORTA Ao Root diam: 2.80 cm Ao Asc diam:  3.50 cm MITRAL VALVE               TRICUSPID VALVE MV Area (PHT): 2.22 cm    TR Peak grad:   21.2 mmHg MV Decel Time: 342 msec    TR Vmax:        230.00 cm/s MV E velocity: 54.40 cm/s MV A velocity: 75.80 cm/s  SHUNTS MV E/A ratio:  0.72        Systemic VTI:  0.18 m                            Systemic Diam: 2.20 cm Dani Gobble Croitoru MD Electronically signed by Sanda Klein MD Signature Date/Time: 07/20/2019/10:39:24 AM    Final    VAS US CAROTID (at [facility] and [facility] only)  Result Date: 07/20/2019 Carotid Arterial Duplex Study Indications:       TIA and Carotid artery disease. Risk Factors:      Hypertension, hyperlipidemia. Comparison Study:  03/22/17 bilateral 40-59% Performing Technologist: June Leap RDMS, RVT  Examination Guidelines: A complete evaluation includes B-mode imaging, spectral Doppler, color Doppler, and power Doppler as needed of all accessible portions of each vessel. Bilateral testing is considered an integral part of a complete examination. Limited examinations for reoccurring indications may be performed as noted.  Right Carotid Findings: +----------+--------+--------+--------+--------------------------+--------+           PSV cm/sEDV cm/sStenosisPlaque Description        Comments +----------+--------+--------+--------+--------------------------+--------+ CCA Prox  57      6                                                  +----------+--------+--------+--------+--------------------------+--------+ CCA Distal73      11                                                  +----------+--------+--------+--------+--------------------------+--------+ ICA Prox  166     33      40-59%  heterogenous and irregular         +----------+--------+--------+--------+--------------------------+--------+ ICA Distal66      19                                                 +----------+--------+--------+--------+--------------------------+--------+ ECA       152     12                                                 +----------+--------+--------+--------+--------------------------+--------+ +----------+--------+-------+----------------+-------------------+  PSV cm/sEDV cmsDescribe        Arm Pressure (mmHG) +----------+--------+-------+----------------+-------------------+ [id number]             Multiphasic, WNL                    +----------+--------+-------+----------------+-------------------+ +---------+--------+--+--------+--+---------+ VertebralPSV cm/s41EDV cm/s11Antegrade +---------+--------+--+--------+--+---------+  Left Carotid Findings: +----------+--------+--------+--------+--------------------+-------------------+           PSV cm/sEDV cm/sStenosisPlaque Description  Comments            +----------+--------+--------+--------+--------------------+-------------------+ CCA Prox  64      9                                                       +----------+--------+--------+--------+--------------------+-------------------+ CCA Distal54      15                                                      +----------+--------+--------+--------+--------------------+-------------------+ ICA Prox  115     23      1-39%   calcific and        acoustic shadowing                                    irregular           may obscure higher                                                        velocities           +----------+--------+--------+--------+--------------------+-------------------+ ICA Distal96      13                                                      +----------+--------+--------+--------+--------------------+-------------------+ ECA       61      3                                                       +----------+--------+--------+--------+--------------------+-------------------+ +----------+--------+--------+--------+-------------------+           PSV cm/sEDV cm/sDescribeArm Pressure (mmHG) +----------+--------+--------+--------+-------------------+ [id number]             Stenotic                    +----------+--------+--------+--------+-------------------+ +---------+--------+--+--------+--+---------+ VertebralPSV cm/s97EDV cm/s12Antegrade +---------+--------+--+--------+--+---------+   Summary: Right Carotid: Velocities in the right ICA are consistent with a 40-59%                stenosis. Left Carotid: Velocities in the left ICA are consistent with a 1-39% stenosis. Subclavians: Left subclavian artery was stenotic. *See table(s) above for measurements  and observations.  Electronically signed by Antony Contras MD on 07/20/2019 at 10:57:45 AM.    Final     Lab Data:  CBC: Recent Labs  Lab 07/30/19 0955 08/01/19 0439  WBC 19.8* 18.0*  NEUTROABS  --  15.6*  HGB 11.1* 10.5*  HCT 35.8* 33.3*  MCV 94.2 93.8  PLT 187 785   Basic Metabolic Panel: Recent Labs  Lab 07/25/19 1949 07/25/19 1949 07/26/19 0908 07/27/19 0508 07/28/19 0327 07/30/19 0955 08/01/19 0439  NA 145   < > 148* 148* 149* 145 152*  K 3.4*   < > 3.3* 3.7 3.9 3.8 3.2*  CL 111   < > 113* 112* 115* 115* 123*  CO2 21*   < > 22 23 21* 17* 17*  GLUCOSE 180*   < > 102* 129* 106* 92 117*  BUN 66*   < > 58* 57* 61* 67* 58*  CREATININE 1.44*   < > 1.21* 1.25* 1.29* 1.39* 1.00  CALCIUM 9.5   < > 9.5 9.7 9.5 8.7* 8.5*  MG 2.5*  --   --   --   --   --  2.4  PHOS 3.8  --   --   --   --   --    --    < > = values in this interval not displayed.   GFR: Estimated Creatinine Clearance: 27.3 mL/min (by C-G formula based on SCr of 1 mg/dL). Liver Function Tests: Recent Labs  Lab 07/25/19 1949 08/01/19 0439  AST  --  20  ALT  --  14  ALKPHOS  --  72  BILITOT  --  0.8  PROT  --  4.9*  ALBUMIN 2.8* 2.3*   No results for input(s): LIPASE, AMYLASE in the last 168 hours. No results for input(s): AMMONIA in the last 168 hours. Coagulation Profile: No results for input(s): INR, PROTIME in the last 168 hours. Cardiac Enzymes: No results for input(s): CKTOTAL, CKMB, CKMBINDEX, TROPONINI in the last 168 hours. BNP (last 3 results) No results for input(s): PROBNP in the last 8760 hours. HbA1C: No results for input(s): HGBA1C in the last 72 hours. CBG: No results for input(s): GLUCAP in the last 168 hours. Lipid Profile: No results for input(s): CHOL, HDL, LDLCALC, TRIG, CHOLHDL, LDLDIRECT in the last 72 hours. Thyroid Function Tests: No results for input(s): TSH, T4TOTAL, FREET4, T3FREE, THYROIDAB in the last 72 hours. Anemia Panel: No results for input(s): VITAMINB12, FOLATE, FERRITIN, TIBC, IRON, RETICCTPCT in the last 72 hours. Urine analysis:    Component Value Date/Time   COLORURINE AMBER (A) 07/28/2019 1524   APPEARANCEUR CLOUDY (A) 07/28/2019 1524   LABSPEC >1.046 (H) 07/28/2019 1524   PHURINE 5.0 07/28/2019 1524   GLUCOSEU NEGATIVE 07/28/2019 1524   HGBUR LARGE (A) 07/28/2019 1524   HGBUR large 02/27/2010 0951   BILIRUBINUR MODERATE (A) 07/28/2019 1524   BILIRUBINUR 1+ 08/12/2014 0937   KETONESUR NEGATIVE 07/28/2019 1524   PROTEINUR 100 (A) 07/28/2019 1524   UROBILINOGEN 0.2 08/12/2014 0937   UROBILINOGEN 0.2 09/21/2010 0236   NITRITE NEGATIVE 07/28/2019 1524   LEUKOCYTESUR MODERATE (A) 07/28/2019 1524     Candiss Galeana M.D. [facility] 08/01/2019, 1:53 PM   Call night coverage person covering after 7pm

## 2019-08-01 NOTE — Plan of Care (Signed)
  Problem: Pain Managment: Goal: General experience of comfort will improve Outcome: Progressing   

## 2019-08-01 NOTE — Progress Notes (Signed)
  Speech Language Pathology Treatment: Cognitive-Linquistic  Patient Details Name: [name] [name] MRN: [id number] DOB: [birth year] Today's Date: 08/01/2019 Time: 7619-5093 SLP Time Calculation (min) (ACUTE ONLY): 9 min  Assessment / Plan / Recommendation Clinical Impression  Pt was seen this morning again with palliative care. She is more alert this morning, but appearing visibly uncomfortable. Attempted additional yes/no questions, which she responded to inconsistently. Suspect she is quite internally distracted at the moment. She responded to 5 of my abstract yes/no questions with 4/5 accuracy. Discussed POC with NP, who says plan is now to shift to comfort with hopeful discharge home with hospice. Pt appropriate for comfort feeds, although as discussed with NP, suspect intake will be quite limited. Even providing frequent oral care and some swabs of thin liquid may give pt comfort as her mouth appears to be quite dry, although pt was resistant to oral care yesterday morning. Given plan of returning home, will f/u x1 to see if there are any educational needs with pt's granddaughter.    HPI HPI: [name] [name] is a 86 y.o. female who presents to the ED for evaluation of left-sided weakness and intermittent expressive aphasia.  MRI showed multiple punctate strokes at left BG and corona radiata. Initial bedside swallow eval was completed 6/18. Patient reported to have worsening of right sided weakness-->now flaccid with significant deficits.      SLP Plan  Continue with current plan of care       Recommendations  Diet recommendations: NPO;Other(comment) (consideration for comfort feeds) Medication Administration: Via alternative means                Oral Care Recommendations: Oral care QID Follow up Recommendations: 24 hour supervision/assistance;Other (comment) (plan to transition home with hospice per NP) SLP Visit Diagnosis: Aphasia (R47.01) Plan: Continue with current  plan of care       GO                Osie Bond., M.A. [facility] Pager [phone] Office [phone]  08/01/2019, 10:04 AM

## 2019-08-01 NOTE — Progress Notes (Signed)
Patient ID: [name] [name], female   DOB: [birth year], 86 y.o.   MRN: [id number]  Documentation regarding HCPOA document on file:   I received a call back from [name] (patient's step daughter). I explained to her the reason I was contacting her--she is listed as first HCPOA on the advanced directive document on file from 2016. The second HCPOA is listed as Hermenia Fiscal (patient's son). Maudie Mercury states she resides in [place] and is currently dealing with her own issues. Per Maudie Mercury, son Herbie Baltimore also lives in [place] and doesn't have contact with his mother; she states he probably will not even answer or return my phone call.  Discussed that granddaughter Lenna Sciara has been actively involved in the patient's care over the past few years and is the most appropriate person to make ongoing medical decisions. Maudie Mercury states that she is comfortable with [name] making all medical decisions for this patient. She also expresses gratitude for [name]'s involvement in this patient's care.

## 2019-08-01 NOTE — Progress Notes (Signed)
[facility]  Referral received for hospice services at home.  Spoke with [employer], she is packing up to come stay with pt in her home once she discharges.  Pt needs hospital bed, will order.  Not sure that Lenna Sciara will be able to get in town and ready for her to d/c today, likely will need to d/c in am.  Venia Carbon RN, BSN, [facility] Liaison

## 2019-08-01 NOTE — Progress Notes (Signed)
The chaplain responded to the consult and spoke with the patient's health care provider. The patient is not able to speak and the daughter of the patient was not present today. Chaplain services is available if and when further support is needed.  Brion Aliment Chaplain Resident For questions concerning this note please contact me by pager [phone]

## 2019-08-01 NOTE — Progress Notes (Signed)
Daily Progress Note   Patient Name: [name] [name]       Date: 08/01/2019 DOB: Nov 09, 1927  Age: 86 y.o. MRN#: [id number] Attending Physician: Mendel Corning, MD Primary Care Physician: Burnis Medin, MD Admit Date: 07/19/2019  Reason for Consultation/Follow-up: goals of care  HPI/Patient Profile: 86 y.o. female  with past medical history of CAD s/p stenting to RCA in 2005, peripheral vascular disease, COPD, CKD stage III, hypertension, hyperlipidemia, and carotid stenosis presented on 07/19/2019 to the ED with left-sided weakness and intermittent aphasia. Initial imaging was negative for stroke, subsequently she developed right-sided weakness and follow-up MRI showed punctuate acute infarcts in the left corona radiata and basal ganglia. Neurology recommended aspirin plavix for 3 months followed by Plavix monotherapy due to severe left MCA stenosis.  Patient with dysphagia and now with suspected aspiration pneumonia, started on Unasyn 6/29.  She also has severe malnutrition--BMI of 17.6 and albumin of 2.8 (6/23).  Palliative care has been consulted to assist with goals of care.   Subjective: I assessed patient at the bedside in coordination with SLP (see her note). Patient showing significant signs of pain--grimacing and moaning.    Spoke with granddaughter [name] by phone to review and confirm our goals of care conversation from yesterday, and recommendation to transition patient to comfort care. [name] [name] confirms that she agrees with this plan of care. Provided education on hospice services (home hospice versus residential); [name] expresses the desire to have her grandmother back in her own home with hospice. She feels confident that she can provide care and states she will have  resources if needed.  Also discussed transitioning to comfort care while in the hospital, and what that would look like--keeping her clean and dry, no labs, no artificial hydration or feeding, no antibiotics, minimizing of medications, comfort feeds, and administering medication for pain and other symptoms as needed. [name] verbalizes understanding of comfort care.  Plan of care discussed with: Dr. Tana Coast, bedside RN, Transitions of care, and Speech Therapist.   Length of Stay: 12  Current Medications:  PRN Meds: acetaminophen **OR** acetaminophen (TYLENOL) oral liquid 160 mg/5 mL **OR** acetaminophen, antiseptic oral rinse, glycopyrrolate, levalbuterol, [COMPLETED] levalbuterol **FOLLOWED BY** levalbuterol, LORazepam **OR** LORazepam, morphine CONCENTRATE, ondansetron **OR** ondansetron (ZOFRAN) IV, polyvinyl alcohol, senna-docusate  Physical Exam Vitals reviewed.  Constitutional:  Appearance: She is ill-appearing.     Comments: Moaning and grimacing  Cardiovascular:     Rate and Rhythm: Normal rate.  Pulmonary:     Effort: Pulmonary effort is normal.     Comments: Room air Neurological:     Mental Status: She is alert.     Cranial Nerves: Facial asymmetry present.     Motor: Weakness present.             Vital Signs: BP (!) 163/61 (BP Location: Right Arm)   Pulse 61   Temp 98 F (36.7 C) (Axillary)   Resp 20   Ht 5\' 4"  (1.626 m)   Wt 45.4 kg   SpO2 100%   BMI 17.16 kg/m  SpO2: SpO2: 100 % O2 Device: O2 Device: Room Air O2 Flow Rate: O2 Flow Rate (L/min): 2 L/min  ILBM: Last BM Date: 08/01/19 Baseline Weight: Weight: 45.4 kg Most recent weight: Weight: 45.4 kg       Palliative Assessment/Data: 20%     Palliative Care Assessment & Plan   Assessment: Patient with significant deficits secondary to left-sided CVA, including severe dysphasia that is likely irreversible.  Documented HCPOA on file Maudie Mercury has deferred all decision making to granddaughter [name] [name], who  agrees that transition to comfort care is most appropriate and compassionate decision at this point.   Recommendations/Plan: TOC order placed for home with hospice Transition to full comfort care while in the hospital Symptom management per end-of life order set Place foley catheter for comfort (prior to discharge)  Symptom Management:  Morphine concentrate solution (ROXINOL) prn for pain or shortness of breath Lorazepam (ATIVAN) prn for anxiety Glycopyrrolate (ROBINUL) for excessive secretions Ondansetron (ZOFRAN) prn for nausea  Code Status: DNR  Prognosis:  < 4 weeks  Discharge Planning: Home with Hospice   Thank you for allowing the Palliative Medicine Team to assist in the care of this patient.   Total Time 35 minutes Prolonged Time Billed  no       Greater than 50%  of this time was spent counseling and coordinating care related to the above assessment and plan.  Lavena Bullion, NP  Please contact Palliative Medicine Team phone at [phone] for questions and concerns.

## 2019-08-02 MED ORDER — LORAZEPAM 2 MG/ML PO CONC: 1.0000 mg | ORAL | 0 refills | Status: AC | PRN

## 2019-08-02 MED ORDER — MORPHINE SULFATE (CONCENTRATE) 10 MG/0.5ML PO SOLN: 5.0000 mg | ORAL | 0 refills | Status: AC | PRN

## 2019-08-02 MED ORDER — SENNOSIDES-DOCUSATE SODIUM 8.6-50 MG PO TABS: 1.0000 | ORAL_TABLET | Freq: Every evening | ORAL | 0 refills | Status: AC | PRN

## 2019-08-02 NOTE — TOC Transition Note (Signed)
Transition of Care [facility]) - CM/SW Discharge Note   Patient Details  Name: [name] [name] MRN: [id number] Date of Birth: Dec 01, 1927  [facility] CM/SW Contact:  Pollie Friar, RN Phone Number: 08/02/2019, 11:34 AM   Clinical Narrative:    Pt discharging home with hospice services through [facility]. Chrislyn with [facility] confirmed DME at the home.  CM spoke to granddaugher, Lenna Sciara and asked about PTAR timing. She asked for a pickup of 12 pm. CM has updated the bedside Rn and called PTAR. D/c packet is at the desk.   Final next level of care: Home w Hospice Care Barriers to Discharge: No Barriers Identified   Patient Goals and CMS Choice Patient states their goals for this hospitalization and ongoing recovery are:: To go back home CMS Medicare.gov Compare Post Acute Care list provided to:: Patient Represenative (must comment) Choice offered to / list presented to :  (Granddaughter)  Discharge Placement                       Discharge Plan and Services                            [place] Date [facility] Contacted: 08/02/19   Representative spoke with at [place]: [place] ([place]) Interventions     Readmission Risk Interventions No flowsheet data found.

## 2019-08-02 NOTE — Discharge Summary (Signed)
Physician Discharge Summary   Patient ID: [name] [name] MRN: [id number] DOB/AGE: 07/17/1927 86 y.o.  Admit date: 07/19/2019 Discharge date: 08/02/2019  Primary Care Physician:  Burnis Medin, MD   Recommendations for Outpatient Follow-up:  1. Comfort care, patient going home with hospice  Home Health:  Equipment/Devices:   Discharge Condition: Comfort care, over all prognosis poor CODE STATUS: DNR Diet recommendation: Comfort food   Discharge Diagnoses:     Acute ischemic left MCA CVA . Aphasia  Dysphagia with aspiration pneumonia  Hypernatremia . CAD (coronary artery disease), native coronary artery . Hyperlipidemia . Essential hypertension, benign . COPD (chronic obstructive pulmonary disease) exacerbation ([place]) . Peripheral vascular disease (HCC) Chronic kidney disease stage IIIa Tobacco use Severe protein calorie malnutrition Pressure injury sacrum medial stage I, POA  Consults:   Neurology Palliative medicine    Allergies:   Allergies  Allergen Reactions  . Medrol [Methylprednisolone] Other (See Comments)    Reported to have hallucinations by daughter after 2 doses of Medrol has been able to take prednisone this was in the setting of pneumonia  . Statins Other (See Comments)    Elevated liver enzymes  . Oxycodone Anxiety and Other (See Comments)    Also states, "It makes me feel like a zombie"  . Albuterol Other (See Comments)    Shakes   . Hydrocodone-Acetaminophen Nausea Only    Dizziness  . Tequin Other (See Comments)    Patient doesn't recall     DISCHARGE MEDICATIONS: Allergies as of 08/02/2019      Reactions   Medrol [methylprednisolone] Other (See Comments)   Reported to have hallucinations by daughter after 2 doses of Medrol has been able to take prednisone this was in the setting of pneumonia   Statins Other (See Comments)   Elevated liver enzymes   Oxycodone Anxiety, Other (See Comments)   Also states, "It makes me feel like  a zombie"   Albuterol Other (See Comments)   Shakes    Hydrocodone-acetaminophen Nausea Only   Dizziness   Tequin Other (See Comments)   Patient doesn't recall      Medication List    STOP taking these medications   amLODipine 10 MG tablet Commonly known as: NORVASC   aspirin 81 MG tablet   isosorbide mononitrate 30 MG 24 hr tablet Commonly known as: IMDUR   metoprolol succinate 25 MG 24 hr tablet Commonly known as: TOPROL-XL   nitroGLYCERIN 0.4 MG SL tablet Commonly known as: NITROSTAT   tiotropium 18 MCG inhalation capsule Commonly known as: SPIRIVA   traMADol 50 MG tablet Commonly known as: ULTRAM     TAKE these medications   carboxymethylcellulose 0.5 % Soln Commonly known as: REFRESH PLUS Place 1 drop into both eyes 4 (four) times daily.   fluticasone 220 MCG/ACT inhaler Commonly known as: FLOVENT HFA Inhale 2 puffs into the lungs 2 (two) times daily.   levalbuterol 45 MCG/ACT inhaler Commonly known as: XOPENEX HFA INHALE 2 PUFFS BY MOUTH EVERY 6 HOURS AS NEEDED FOR WHEEZING What changed: See the new instructions.   LORazepam 2 MG/ML concentrated solution Commonly known as: ATIVAN Place 0.5 mLs (1 mg total) under the tongue every 4 (four) hours as needed for anxiety.   morphine CONCENTRATE 10 MG/0.5ML Soln concentrated solution Place 0.25 mLs (5 mg total) under the tongue every 4 (four) hours as needed for moderate pain (or dyspnea).   senna-docusate 8.6-50 MG tablet Commonly known as: Senokot-S Take 1 tablet by mouth at bedtime as  needed for mild constipation.        Brief H and P: For complete details please refer to admission H and P, but in brief Patient is a 86 year old female with history of coronary disease status post stenting to RCA in 2005, peripheral vascular disease, COPD, CKD stage IIIa, hypertension, hyperlipidemia, carotid stenosis who presented to the emergency department for the evaluation of left-sided weakness and intermittent  expressive aphasia. Initial CT head/MRI of the brain did not show any stroke. Later in the hospital, she developed right-sided weakness. Repeat MRI showed acute infarct in the left corona radiataand basal ganglia. Neurology was consulted and following. Stroke workup completed. Neurology recommended dual antiplatelet therapy with aspirin Plavix for 3 months followed by Plavix monotherapy because of severe left MCA stenosis.  Hospital Course:    Acute ischemic left MCA stroke [facility]), residual dysphagia, aspiration pneumonia -Presented with left-sided weakness, intermittent expressive aphasia. -Initial CT head, MRI of the brain did not show any CVA. -Subsequently she developed right-sided weakness and repeat MRI showed acute multiple punctate CVA in the left basal ganglia and corona radiata - MRA head showed 6 mm short segment severe stenosis involving the mid distal left M1 segment.  -CTA head and neck showed compensated severe left MCA M1 stenosis, severe left vertebral V4 segment stenosis, moderate to severe bilateral PCA stenosis. Also showed bilateral carotid bifurcation stenosis affecting ICA: 60% on the right and 55 to 60% on the left.  -Neurology was consulted, recommended aspirin 325 mg daily and Plavix for 3 months followed by Plavix indefinitely. -Hemoglobin A1c 5.6, LDL 120 -Patient unfortunately continued to aspirate despite full efforts from speech therapy, dysphagia 2 diet. -Developed new aspiration pneumonia and has been gradually declining.  Palliative medicine was consulted, with extensive goals of care discussion, patient has been now transitioned to full comfort care per patient's family's wishes.  DNR -Continue morphine sublingual, Ativan sublingual for comfort measures per palliative medicine recommendations.    COPD exacerbation, aspiration pneumonia -Patient was placed on bronchodilators, IV antibiotics, gentle hydration.  However she continued to deteriorate  clinically.  Patient was placed on full comfort measures on 6/30 after goals of care discussion with family   Hypernatremia -Persistent hypernatremia, sodium 152 on 6/30 .  IV fluids discontinued - patient was placed on full comfort measures.  UTI -UA on 6/26 showed moderate leukocytes, few bacteria, WBCs 6-10, patient was started on IV Rocephin, urine culture showed no growth -Comfort measures  Hypertension -Patient placed on comfort measures, DC antihypertensives  Hyperlipidemia LDL 120, has statin intolerance,  patient was placed on Zetia, now has been discontinued  CAD -Status post RCA stent, no chest pain, continue current management   CKD stage IIIa: -Chronic and appears stable  Tobacco abuse:  -Smoking cessation was consulted on admission, now comfort care  Severe malnutrition:  -BMI of 17.16, now with aspiration and dysphagia, now comfort care  Pressure injury: Sacrum medial stage I, POA     Day of Discharge S: Currently appears comfortable, not in any acute pain.  BP (!) 155/70 (BP Location: Right Arm)   Pulse 69   Temp 98.2 F (36.8 C) (Axillary)   Resp 18   Ht 5\' 4"  (1.626 m)   Wt 45.4 kg   SpO2 97%   BMI 17.16 kg/m   Physical Exam: General: Appears comfortable, not responding to verbal commands CVS: S1-S2 clear no murmur rubs or gallops Chest: Decreased breath sound at the bases Abdomen: soft nontender, nondistended, normal bowel sounds Extremities:  no cyanosis, clubbing or edema noted bilaterally Neuro: Does not follow commands    Get Medicines reviewed and adjusted: Please take all your medications with you for your next visit with your Primary MD  Please request your Primary MD to go over all hospital tests and procedure/radiological results at the follow up. Please ask your Primary MD to get all Hospital records sent to his/her office.  If you experience worsening of your admission symptoms, develop shortness of breath, life  threatening emergency, suicidal or homicidal thoughts you must seek medical attention immediately by calling 911 or calling your MD immediately  if symptoms less severe.  You must read complete instructions/literature along with all the possible adverse reactions/side effects for all the Medicines you take and that have been prescribed to you. Take any new Medicines after you have completely understood and accept all the possible adverse reactions/side effects.   Do not drive when taking pain medications.   Do not take more than prescribed Pain, Sleep and Anxiety Medications  Special Instructions: If you have smoked or chewed Tobacco  in the last 2 yrs please stop smoking, stop any regular Alcohol  and or any Recreational drug use.  Wear Seat belts while driving.  Please note  You were cared for by a hospitalist during your hospital stay. Once you are discharged, your primary care physician will handle any further medical issues. Please note that NO REFILLS for any discharge medications will be authorized once you are discharged, as it is imperative that you return to your primary care physician (or establish a relationship with a primary care physician if you do not have one) for your aftercare needs so that they can reassess your need for medications and monitor your lab values.   The results of significant diagnostics from this hospitalization (including imaging, microbiology, ancillary and laboratory) are listed below for reference.      Procedures/Studies:  EEG  Result Date: 07/20/2019 Lora Havens, MD     07/20/2019  3:39 PM Patient Name: LYNDSI ALTIC MRN: [id number] Epilepsy Attending: Lora Havens Referring Physician/Provider: Dr. Kerney Elbe Date: 07/20/2019 Duration: 24.12 mins Patient history: 86 year old female presenting with transient spells of expressive aphasia. EEG evaluate for seizures. Level of alertness: Awake AEDs during EEG study: None Technical aspects:  This EEG study was done with scalp electrodes positioned according to the 10-20 International system of electrode placement. Electrical activity was acquired at a sampling rate of 500Hz  and reviewed with a high frequency filter of 70Hz  and a low frequency filter of 1Hz . EEG data were recorded continuously and digitally stored. Description: The posterior dominant rhythm consists of 8 Hz activity of moderate voltage (25-35 uV) seen predominantly in posterior head regions, symmetric and reactive to eye opening and eye closing. EEG showed intermittent 2-3Hz  delta slowing in left temporal region.  Hyperventilation and photic stimulation were not performed.   ABNORMALITY -Intermittent slow, left temporal region IMPRESSION: This study is suggestive of non specific cortical dysfunction in left temporal region. No seizures or epileptiform discharges were seen throughout the recording. Lora Havens   CT Code Stroke CTA Head W/WO contrast  Result Date: 07/20/2019 CLINICAL DATA:  86 year old female with neurologic deficit. Unrevealing brain MRI yesterday, but MRA with severe left M1 stenosis. EXAM: CT ANGIOGRAPHY HEAD AND NECK CT PERFUSION BRAIN TECHNIQUE: Multidetector CT imaging of the head and neck was performed using the standard protocol during bolus administration of intravenous contrast. Multiplanar CT image reconstructions and MIPs were obtained to  evaluate the vascular anatomy. Carotid stenosis measurements (when applicable) are obtained utilizing NASCET criteria, using the distal internal carotid diameter as the denominator. Multiphase CT imaging of the brain was performed following IV bolus contrast injection. Subsequent parametric perfusion maps were calculated using RAPID software. CONTRAST:  70mL OMNIPAQUE IOHEXOL 350 MG/ML SOLN COMPARISON:  Brain MRI and intracranial MRA 07/19/2019. FINDINGS: CT Brain Perfusion Findings: ASPECTS: 10 CBF (<30%) Volume: None Perfusion (Tmax>6.0s) volume: None, and no  significant T-max asymmetry from the left to right hemisphere. Mismatch Volume: Not applicable Infarction Location:Not applicable CT HEAD Brain: Calcified atherosclerosis at the skull base. Stable cortical encephalomalacia in the left occipital pole. Patchy bilateral white matter hypodensity is stable. Dystrophic basal ganglia calcifications again noted. No midline shift, ventriculomegaly, mass effect, evidence of mass lesion, intracranial hemorrhage or evidence of cortically based acute infarction. Calvarium and skull base: Mild chronic right orbital floor fracture. No acute osseous abnormality identified. Paranasal sinuses: Sinuses and mastoids are largely clear today. Orbits: No acute orbit or scalp soft tissue finding. CTA NECK Skeleton: Advanced degenerative changes throughout the cervical spine. No acute osseous abnormality identified. Upper chest: Centrilobular emphysema. Mild apical lung scarring. No superior mediastinal lymphadenopathy. Sequelae of right axillary node dissection. Other neck: No acute findings. Aortic arch: Fairly bulky soft and calcified plaque throughout the aortic arch. Three vessel arch configuration. Right carotid system: Brachiocephalic artery and right CCA origin plaque without stenosis. Intermittent plaque in the right CCA without stenosis proximal to the bifurcation. Bulky calcified plaque at the carotid bifurcation and continuing into the proximal right ICA resulting in up to 60 % stenosis with respect to the distal vessel (series 7, image 103). Distal to the bulb no other right ICA plaque to the skull base. Left carotid system: Left CCA origin and other plaque without stenosis proximal to the bifurcation. Bulky calcified plaque at the left ICA origin and bulb resulting in 55-60 % stenosis with respect to the distal vessel (series 10, image 101). Mildly tortuous left ICA. Vertebral arteries: Proximal right subclavian plaque without stenosis. The right vertebral artery origin appear  spared. Mild right V2 segment plaque, no right vertebral stenosis to the skull base. Proximal left subclavian artery soft and calcified plaque is extensive although less than 50 % stenosis with respect to the distal vessel results. Mild stenosis at the left vertebral artery origin. Additional mild soft plaque in the left V1 segment. Patent and slightly non dominant left vertebral artery to the skull base without significant stenosis. CTA HEAD Posterior circulation: Bulky and circumferential calcified plaque of the left V4 segment with high-grade stenosis on series 9, image 145. This is concordant with the MRA yesterday. Mild right V4 calcified plaque without significant stenosis. The left vertebral and left PICA remain patent despite the high-grade stenosis. There is moderate stenosis again at the left vertebrobasilar junction. Patent basilar artery with mild irregularity and no significant stenosis. Patent SCA and PCA origins. Posterior communicating arteries are diminutive or absent. Severe left PCA stenosis from the P2 segment on word. Moderate to severe right P2 stenosis with better distal right PCA enhancement. This is concordant with the MRA. Anterior circulation: Both ICA siphons are patent. Calcified plaque on the left with only mild supraclinoid segment stenosis. Right side calcified plaque and tortuosity with mild supraclinoid stenosis. Patent bilateral carotid termini. Patent MCA and ACA origins. Anterior communicating artery, bilateral ACA branches, right MCA M1, and right MCA bifurcation are patent without stenosis. Mild to moderate right M2 and M3 branch irregularities and  stenosis appear similar to the MRA. Left MCA M1 demonstrates a relatively long 6 mm segment of moderate to severe stenosis best seen on series 12, image 20. This leads up to the left MCA trifurcation which remains patent. Left MCA branches demonstrate fairly symmetric enhancement to those on the right, and only mild additional  irregularity. Venous sinuses: Early contrast timing, not evaluated. Anatomic variants: Mildly dominant right vertebral artery. Review of the MIP images confirms the above findings IMPRESSION: 1. Compensated Severe Left MCA M1 stenosis. CTP is negative for infarct core or penumbra. And CT appearance of the brain is stable. 2. Other intracranial CTA findings concordant to the MRA yesterday including: - Severe Left Vertebral V4 segment stenosis. - Moderate to severe bilateral PCA stenosis worse on the left. 3. Extracranial calcified plaque resulting in bilateral carotid bifurcation stenosis affecting the ICAs: 60% on the Right and 55-60% on the Left . 4. No significant cervical vertebral artery stenosis. 5. Aortic Atherosclerosis (ICD10-I70.0) and Emphysema (ICD10-J43.9). Electronically Signed   By: Genevie Ann M.D.   On: 07/20/2019 04:20   DG Chest 1 View  Result Date: 07/25/2019 CLINICAL DATA:  86 year old female with shortness of breath. EXAM: CHEST  1 VIEW COMPARISON:  Chest radiograph dated 07/24/2019. FINDINGS: No focal consolidation, pleural effusion or pneumothorax. The cardiac silhouette is within limits. Atherosclerotic calcification of the aorta. No acute osseous pathology. Degenerative changes of the spine. IMPRESSION: No active cardiopulmonary disease. Electronically Signed   By: Anner Crete M.D.   On: 07/25/2019 21:35   DG Chest 2 View  Result Date: 07/17/2019 CLINICAL DATA:  Chest tightness since last evening, lower extremity numbness EXAM: CHEST - 2 VIEW COMPARISON:  11/23/2018 FINDINGS: Frontal and lateral views of the chest demonstrate a stable cardiac silhouette. Continued atherosclerosis of the thoracic aorta. Chronic elevation of the left hemidiaphragm. Background emphysema without airspace disease, effusion, or pneumothorax. No acute bony abnormalities. IMPRESSION: 1. Stable exam, no acute process. Electronically Signed   By: Randa Ngo M.D.   On: 07/17/2019 17:21   CT Code Stroke  CTA Neck W/WO contrast  Result Date: 07/20/2019 CLINICAL DATA:  86 year old female with neurologic deficit. Unrevealing brain MRI yesterday, but MRA with severe left M1 stenosis. EXAM: CT ANGIOGRAPHY HEAD AND NECK CT PERFUSION BRAIN TECHNIQUE: Multidetector CT imaging of the head and neck was performed using the standard protocol during bolus administration of intravenous contrast. Multiplanar CT image reconstructions and MIPs were obtained to evaluate the vascular anatomy. Carotid stenosis measurements (when applicable) are obtained utilizing NASCET criteria, using the distal internal carotid diameter as the denominator. Multiphase CT imaging of the brain was performed following IV bolus contrast injection. Subsequent parametric perfusion maps were calculated using RAPID software. CONTRAST:  87mL OMNIPAQUE IOHEXOL 350 MG/ML SOLN COMPARISON:  Brain MRI and intracranial MRA 07/19/2019. FINDINGS: CT Brain Perfusion Findings: ASPECTS: 10 CBF (<30%) Volume: None Perfusion (Tmax>6.0s) volume: None, and no significant T-max asymmetry from the left to right hemisphere. Mismatch Volume: Not applicable Infarction Location:Not applicable CT HEAD Brain: Calcified atherosclerosis at the skull base. Stable cortical encephalomalacia in the left occipital pole. Patchy bilateral white matter hypodensity is stable. Dystrophic basal ganglia calcifications again noted. No midline shift, ventriculomegaly, mass effect, evidence of mass lesion, intracranial hemorrhage or evidence of cortically based acute infarction. Calvarium and skull base: Mild chronic right orbital floor fracture. No acute osseous abnormality identified. Paranasal sinuses: Sinuses and mastoids are largely clear today. Orbits: No acute orbit or scalp soft tissue finding. CTA NECK Skeleton: Advanced  degenerative changes throughout the cervical spine. No acute osseous abnormality identified. Upper chest: Centrilobular emphysema. Mild apical lung scarring. No superior  mediastinal lymphadenopathy. Sequelae of right axillary node dissection. Other neck: No acute findings. Aortic arch: Fairly bulky soft and calcified plaque throughout the aortic arch. Three vessel arch configuration. Right carotid system: Brachiocephalic artery and right CCA origin plaque without stenosis. Intermittent plaque in the right CCA without stenosis proximal to the bifurcation. Bulky calcified plaque at the carotid bifurcation and continuing into the proximal right ICA resulting in up to 60 % stenosis with respect to the distal vessel (series 7, image 103). Distal to the bulb no other right ICA plaque to the skull base. Left carotid system: Left CCA origin and other plaque without stenosis proximal to the bifurcation. Bulky calcified plaque at the left ICA origin and bulb resulting in 55-60 % stenosis with respect to the distal vessel (series 10, image 101). Mildly tortuous left ICA. Vertebral arteries: Proximal right subclavian plaque without stenosis. The right vertebral artery origin appear spared. Mild right V2 segment plaque, no right vertebral stenosis to the skull base. Proximal left subclavian artery soft and calcified plaque is extensive although less than 50 % stenosis with respect to the distal vessel results. Mild stenosis at the left vertebral artery origin. Additional mild soft plaque in the left V1 segment. Patent and slightly non dominant left vertebral artery to the skull base without significant stenosis. CTA HEAD Posterior circulation: Bulky and circumferential calcified plaque of the left V4 segment with high-grade stenosis on series 9, image 145. This is concordant with the MRA yesterday. Mild right V4 calcified plaque without significant stenosis. The left vertebral and left PICA remain patent despite the high-grade stenosis. There is moderate stenosis again at the left vertebrobasilar junction. Patent basilar artery with mild irregularity and no significant stenosis. Patent SCA and  PCA origins. Posterior communicating arteries are diminutive or absent. Severe left PCA stenosis from the P2 segment on word. Moderate to severe right P2 stenosis with better distal right PCA enhancement. This is concordant with the MRA. Anterior circulation: Both ICA siphons are patent. Calcified plaque on the left with only mild supraclinoid segment stenosis. Right side calcified plaque and tortuosity with mild supraclinoid stenosis. Patent bilateral carotid termini. Patent MCA and ACA origins. Anterior communicating artery, bilateral ACA branches, right MCA M1, and right MCA bifurcation are patent without stenosis. Mild to moderate right M2 and M3 branch irregularities and stenosis appear similar to the MRA. Left MCA M1 demonstrates a relatively long 6 mm segment of moderate to severe stenosis best seen on series 12, image 20. This leads up to the left MCA trifurcation which remains patent. Left MCA branches demonstrate fairly symmetric enhancement to those on the right, and only mild additional irregularity. Venous sinuses: Early contrast timing, not evaluated. Anatomic variants: Mildly dominant right vertebral artery. Review of the MIP images confirms the above findings IMPRESSION: 1. Compensated Severe Left MCA M1 stenosis. CTP is negative for infarct core or penumbra. And CT appearance of the brain is stable. 2. Other intracranial CTA findings concordant to the MRA yesterday including: - Severe Left Vertebral V4 segment stenosis. - Moderate to severe bilateral PCA stenosis worse on the left. 3. Extracranial calcified plaque resulting in bilateral carotid bifurcation stenosis affecting the ICAs: 60% on the Right and 55-60% on the Left . 4. No significant cervical vertebral artery stenosis. 5. Aortic Atherosclerosis (ICD10-I70.0) and Emphysema (ICD10-J43.9). Electronically Signed   By: Herminio Heads.D.  On: 07/20/2019 04:20   MR ANGIO HEAD WO CONTRAST  Result Date: 07/20/2019 CLINICAL DATA:  Follow-up  examination for acute stroke. EXAM: MRA HEAD WITHOUT CONTRAST TECHNIQUE: Angiographic images of the Circle of Willis were obtained using MRA technique without intravenous contrast. COMPARISON:  Comparison made with prior brain MRI from earlier the same day. FINDINGS: ANTERIOR CIRCULATION: Examination mildly degraded by motion artifact. Distal cervical segments of both internal carotid arteries are widely patent with symmetric antegrade flow. Petrous, cavernous, and supraclinoid ICAs widely patent without stenosis or other abnormality. A1 segments patent. Normal anterior communicating artery complex. Anterior cerebral arteries patent to their distal aspects without stenosis. Right M1 widely patent. Normal right MCA bifurcation. Distal right MCA branches well perfused. Focal severe stenosis seen involving the mid-distal left M1 segment (series 1045, image 13). Stenosis measures approximately 6 mm in length. Left MCA bifurcation patent distally. Distal left MCA branches are perfused, although mildly attenuated as compared to the contralateral right MCA branches. POSTERIOR CIRCULATION: Right vertebral artery dominant and widely patent to the vertebrobasilar junction. Left vertebral artery not seen as it courses into the cranial vault, and may be partially occluded. Flow related signal is seen within the distal left V4 segment with filling of the left PICA, which could be retrograde in nature across the vertebrobasilar junction. Right PICA not seen. Basilar widely patent to its distal aspect without stenosis. Superior cerebral arteries patent bilaterally. Both PCAs primarily supplied via the basilar. Atheromatous change with associated moderate bilateral P2 stenoses noted. PCAs remain patent to their distal aspects. No intracranial aneurysm. IMPRESSION: 1. 6 mm short segment severe stenosis involving the mid-distal left M1 segment. Distal left MCA branches are perfused, although attenuated as compared to the  contralateral right MCA branches. 2. Nonvisualization of the left vertebral artery as it courses into the cranial vault, which may be partially occluded. Retrograde filling of the left V4 segment and left PICA across the vertebrobasilar junction. 3. Atheromatous change throughout the PCAs bilaterally with associated moderate bilateral P2 stenoses. Electronically Signed   By: Jeannine Boga M.D.   On: 07/20/2019 02:00   MR BRAIN WO CONTRAST  Result Date: 07/20/2019 CLINICAL DATA:  Stroke follow-up. Right facial droop and mild right hemiparesis. EXAM: MRI HEAD WITHOUT CONTRAST TECHNIQUE: Multiplanar, multiecho pulse sequences of the brain and surrounding structures were obtained without intravenous contrast. COMPARISON:  Head CT, CTA, and CTP 07/20/2019 and MRI 07/19/2019 FINDINGS: At the request of the ordering neurologist, only axial and coronal diffusion weighted imaging was obtained. There are new punctate acute infarcts involving the left corona radiata and superior left lentiform nucleus. No intracranial mass effect or extra-axial fluid collection is identified. Assessment of chronic findings including chronic small vessel ischemia in the cerebral white matter is deferred to yesterday's complete brain MRI. IMPRESSION: Punctate acute infarcts in the left corona radiata and basal ganglia. Electronically Signed   By: Logan Bores M.D.   On: 07/20/2019 20:49   MR BRAIN WO CONTRAST  Result Date: 07/19/2019 CLINICAL DATA:  Ataxia. EXAM: MRI HEAD WITHOUT CONTRAST TECHNIQUE: Multiplanar, multiecho pulse sequences of the brain and surrounding structures were obtained without intravenous contrast. COMPARISON:  Head CT 06/08/2016 FINDINGS: Brain: Diffusion imaging does not show any acute or subacute infarction. There chronic small-vessel ischemic changes of the pons. No focal cerebellar stroke. There is an old left occipital cortical and subcortical infarction. There are chronic small-vessel ischemic changes  of the cerebral hemispheric white matter. Old small vessel infarction in the right thalamus. No  sign of mass lesion, hemorrhage, hydrocephalus or extra-axial collection. Vascular: Major vessels at the base of the brain show flow. Skull and upper cervical spine: 2 foci of signal within the posterior parietal calvarium towards the vertex, most consistent with hemangiomas. Sinuses/Orbits: Clear/normal Other: None IMPRESSION: No acute brain finding. No abnormality seen to explain acute ataxia. Chronic small-vessel change of the pons and hemispheric white matter. 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 Left mastoid effusion. Electronically Signed   By: Nelson Chimes M.D.   On: 07/19/2019 18:52   CT Code Stroke Cerebral Perfusion with contrast  Result Date: 07/20/2019 CLINICAL DATA:  86 year old female with neurologic deficit. Unrevealing brain MRI yesterday, but MRA with severe left M1 stenosis. EXAM: CT ANGIOGRAPHY HEAD AND NECK CT PERFUSION BRAIN TECHNIQUE: Multidetector CT imaging of the head and neck was performed using the standard protocol during bolus administration of intravenous contrast. Multiplanar CT image reconstructions and MIPs were obtained to evaluate the vascular anatomy. Carotid stenosis measurements (when applicable) are obtained utilizing NASCET criteria, using the distal internal carotid diameter as the denominator. Multiphase CT imaging of the brain was performed following IV bolus contrast injection. Subsequent parametric perfusion maps were calculated using RAPID software. CONTRAST:  71mL OMNIPAQUE IOHEXOL 350 MG/ML SOLN COMPARISON:  Brain MRI and intracranial MRA 07/19/2019. FINDINGS: CT Brain Perfusion Findings: ASPECTS: 10 CBF (<30%) Volume: None Perfusion (Tmax>6.0s) volume: None, and no significant T-max asymmetry from the left to right hemisphere. Mismatch Volume: Not applicable Infarction Location:Not applicable CT HEAD Brain: Calcified atherosclerosis at the skull base.  Stable cortical encephalomalacia in the left occipital pole. Patchy bilateral white matter hypodensity is stable. Dystrophic basal ganglia calcifications again noted. No midline shift, ventriculomegaly, mass effect, evidence of mass lesion, intracranial hemorrhage or evidence of cortically based acute infarction. Calvarium and skull base: Mild chronic right orbital floor fracture. No acute osseous abnormality identified. Paranasal sinuses: Sinuses and mastoids are largely clear today. Orbits: No acute orbit or scalp soft tissue finding. CTA NECK Skeleton: Advanced degenerative changes throughout the cervical spine. No acute osseous abnormality identified. Upper chest: Centrilobular emphysema. Mild apical lung scarring. No superior mediastinal lymphadenopathy. Sequelae of right axillary node dissection. Other neck: No acute findings. Aortic arch: Fairly bulky soft and calcified plaque throughout the aortic arch. Three vessel arch configuration. Right carotid system: Brachiocephalic artery and right CCA origin plaque without stenosis. Intermittent plaque in the right CCA without stenosis proximal to the bifurcation. Bulky calcified plaque at the carotid bifurcation and continuing into the proximal right ICA resulting in up to 60 % stenosis with respect to the distal vessel (series 7, image 103). Distal to the bulb no other right ICA plaque to the skull base. Left carotid system: Left CCA origin and other plaque without stenosis proximal to the bifurcation. Bulky calcified plaque at the left ICA origin and bulb resulting in 55-60 % stenosis with respect to the distal vessel (series 10, image 101). Mildly tortuous left ICA. Vertebral arteries: Proximal right subclavian plaque without stenosis. The right vertebral artery origin appear spared. Mild right V2 segment plaque, no right vertebral stenosis to the skull base. Proximal left subclavian artery soft and calcified plaque is extensive although less than 50 % stenosis  with respect to the distal vessel results. Mild stenosis at the left vertebral artery origin. Additional mild soft plaque in the left V1 segment. Patent and slightly non dominant left vertebral artery to the skull base without significant stenosis. CTA HEAD Posterior circulation: Bulky and circumferential calcified plaque of the  left V4 segment with high-grade stenosis on series 9, image 145. This is concordant with the MRA yesterday. Mild right V4 calcified plaque without significant stenosis. The left vertebral and left PICA remain patent despite the high-grade stenosis. There is moderate stenosis again at the left vertebrobasilar junction. Patent basilar artery with mild irregularity and no significant stenosis. Patent SCA and PCA origins. Posterior communicating arteries are diminutive or absent. Severe left PCA stenosis from the P2 segment on word. Moderate to severe right P2 stenosis with better distal right PCA enhancement. This is concordant with the MRA. Anterior circulation: Both ICA siphons are patent. Calcified plaque on the left with only mild supraclinoid segment stenosis. Right side calcified plaque and tortuosity with mild supraclinoid stenosis. Patent bilateral carotid termini. Patent MCA and ACA origins. Anterior communicating artery, bilateral ACA branches, right MCA M1, and right MCA bifurcation are patent without stenosis. Mild to moderate right M2 and M3 branch irregularities and stenosis appear similar to the MRA. Left MCA M1 demonstrates a relatively long 6 mm segment of moderate to severe stenosis best seen on series 12, image 20. This leads up to the left MCA trifurcation which remains patent. Left MCA branches demonstrate fairly symmetric enhancement to those on the right, and only mild additional irregularity. Venous sinuses: Early contrast timing, not evaluated. Anatomic variants: Mildly dominant right vertebral artery. Review of the MIP images confirms the above findings IMPRESSION: 1.  Compensated Severe Left MCA M1 stenosis. CTP is negative for infarct core or penumbra. And CT appearance of the brain is stable. 2. Other intracranial CTA findings concordant to the MRA yesterday including: - Severe Left Vertebral V4 segment stenosis. - Moderate to severe bilateral PCA stenosis worse on the left. 3. Extracranial calcified plaque resulting in bilateral carotid bifurcation stenosis affecting the ICAs: 60% on the Right and 55-60% on the Left . 4. No significant cervical vertebral artery stenosis. 5. Aortic Atherosclerosis (ICD10-I70.0) and Emphysema (ICD10-J43.9). Electronically Signed   By: Genevie Ann M.D.   On: 07/20/2019 04:20   DG Chest Port 1 View  Result Date: 07/31/2019 CLINICAL DATA:  Shortness of breath. EXAM: PORTABLE CHEST 1 VIEW COMPARISON:  07/30/2019 FINDINGS: The cardiac silhouette, mediastinal and hilar contours are within normal limits. Stable tortuosity and calcification of the thoracic aorta. No acute pulmonary findings or worrisome pulmonary lesions. No pleural effusion. Mild stable emphysematous changes. Surgical changes in the right axilla. IMPRESSION: No acute cardiopulmonary findings. Electronically Signed   By: Marijo Sanes M.D.   On: 07/31/2019 07:48   DG CHEST PORT 1 VIEW  Result Date: 07/30/2019 CLINICAL DATA:  Dysphagia. EXAM: PORTABLE CHEST 1 VIEW COMPARISON:  Chest x-ray 07/25/2019 FINDINGS: The cardiac silhouette, mediastinal and hilar contours are within normal limits and stable. Stable tortuosity and calcification of the thoracic aorta. The lungs are clear of an acute process. No pleural effusions. Surgical changes noted in the right axilla. The bony thorax is intact. IMPRESSION: No acute cardiopulmonary findings. Electronically Signed   By: Marijo Sanes M.D.   On: 07/30/2019 10:35   DG CHEST PORT 1 VIEW  Result Date: 07/24/2019 CLINICAL DATA:  Shortness of breath. EXAM: PORTABLE CHEST 1 VIEW COMPARISON:  July 21, 2019. FINDINGS: The heart size and  mediastinal contours are within normal limits. No pneumothorax or pleural effusion is noted. Atherosclerosis of thoracic aorta is noted. Both lungs are clear. The visualized skeletal structures are unremarkable. IMPRESSION: No active disease. Aortic Atherosclerosis (ICD10-I70.0). Electronically Signed   By: Bobbe Medico.D.  On: 07/24/2019 14:48   DG CHEST PORT 1 VIEW  Result Date: 07/21/2019 CLINICAL DATA:  86 year old female with bilateral crackle. EXAM: PORTABLE CHEST 1 VIEW COMPARISON:  Chest radiograph dated 07/21/2019. FINDINGS: Probable trace left pleural effusion or pleural thickening with minimal left lung base atelectasis. No focal consolidation, or pneumothorax. The cardiac silhouette is within limits. Atherosclerotic calcification of the aorta. Probable old healed left lateral rib fracture. No acute osseous pathology. Degenerative changes of the spine. Right axillary surgical clips. IMPRESSION: Probable trace left pleural effusion with minimal left lung base atelectasis. Electronically Signed   By: Anner Crete M.D.   On: 07/21/2019 21:26   DG CHEST PORT 1 VIEW  Result Date: 07/21/2019 CLINICAL DATA:  Cough. EXAM: PORTABLE CHEST 1 VIEW COMPARISON:  July 20, 2019 FINDINGS: The heart, hila, mediastinum, lungs, and pleura are unchanged and unremarkable. IMPRESSION: No active disease. Electronically Signed   By: Dorise Bullion III M.D   On: 07/21/2019 11:21   DG CHEST PORT 1 VIEW  Result Date: 07/20/2019 CLINICAL DATA:  Hypothermia EXAM: PORTABLE CHEST 1 VIEW COMPARISON:  07/19/2019 FINDINGS: There is hyperinflation of the lungs compatible with COPD. Heart is normal size. Aortic atherosclerosis. No confluent opacities or effusions. No acute bony abnormality. IMPRESSION: COPD.  No active disease. Electronically Signed   By: Rolm Baptise M.D.   On: 07/20/2019 02:27   DG Chest Port 1 View  Result Date: 07/19/2019 CLINICAL DATA:  [place]ales EXAM: PORTABLE CHEST 1 VIEW COMPARISON:  07/17/2019  FINDINGS: Single frontal view of the chest was performed, excluding the right costophrenic angle by collimation. The cardiac silhouette is unremarkable. Stable atherosclerosis of the aortic arch. No airspace disease, effusion, or pneumothorax. No acute bony abnormalities. IMPRESSION: 1. Stable exam, no acute process. Electronically Signed   By: Randa Ngo M.D.   On: 07/19/2019 18:31   DG Swallowing Func-Speech Pathology  Result Date: 07/24/2019 Objective Swallowing Evaluation: Type of Study: MBS-Modified Barium Swallow Study  Patient Details Name: ONEDIA VARGUS MRN: [id number] Date of Birth: [birth year] Today's Date: 07/24/2019 Time: SLP Start Time (ACUTE ONLY): 1215 -SLP Stop Time (ACUTE ONLY): 1245 SLP Time Calculation (min) (ACUTE ONLY): 30 min Past Medical History: Past Medical History: Diagnosis Date . CARCINOMA, BASAL CELL 02/13/2006 . CAROTID ARTERY DISEASE 10/03/2006  Carotid US 11/17: Stable 40-59% bilateral ICA stenosis >> f/u 1 year . CHRONIC OBSTRUCTIVE PULMONARY DISEASE, ACUTE EXACERBATION 11/17/2009 . Chronic rhinitis 12/13/2006 . CORONARY ARTERY DISEASE 02/13/2006 . DEGENERATIVE JOINT DISEASE 09/27/2006 . DERMATITIS 09/25/2007 . Head trauma   closed . HYPERLIPIDEMIA 03/10/2008 . HYPERTENSION 02/13/2006 . HYPOTHYROIDISM 01/30/2007 . LIVER FUNCTION TESTS, ABNORMAL 01/22/2009 . LOSS, HEARING NOS 08/25/2006 . Near syncope 09/18/2014 . NEOPLASM, SKIN, UNCERTAIN BEHAVIOR 93/71/6967 . NEPHROLITHIASIS, HX OF 02/27/2010 . OSTEOARTHRITIS, HAND 12/16/2008 . PERIPHERAL VASCULAR DISEASE 02/13/2006 . Personal history of malignant neoplasm of breast 07/24/2008 . Personal history of urinary calculi  . Post-traumatic wound infection 12/01/2010  Mild streaking superiorly and on one day of septra  Will give rocephin and add keflex  adn follow up  . POSTHERPETIC NEURALGIA 08/25/2006 . RENAL ARTERY STENOSIS 08/01/2008 . Shingles   recurrent . SPINAL STENOSIS 11/01/2006 . THROMBOCYTOPENIA 07/30/2008 . UNS ADVRS EFF UNS RX  MEDICINAL&BIOLOGICAL SBSTNC 01/30/2007 . UNSPECIFIED ANEMIA 03/18/2008 . Unspecified vitamin D deficiency 02/07/2007 . UNSTABLE ANGINA 12/12/2009 . Vitreous detachment  Past Surgical History: Past Surgical History: Procedure Laterality Date . ABDOMINAL HYSTERECTOMY   . BREAST LUMPECTOMY   . BREAST LUMPECTOMY  1998 . CATARACT EXTRACTION   .  CORONARY ANGIOPLASTY WITH STENT PLACEMENT    bilat iliac stents, left renal artery and rt coronary artery last myoview 8/09 with EF 70% . LUMBAR LAMINECTOMY   . SKIN BIOPSY  08/24/2017  Invasive squamous cell carcinoma . SKIN CANCER EXCISION   . TONSILLECTOMY   HPI: KAMIAH FITE is a 86 y.o. female who presents to the ED for evaluation of left-sided weakness and intermittent expressive aphasia.  MRI showed multiple punctate strokes at left BG and corona radiata. Initial bedside swallow eval was completed 6/18. Patient reported to have worsening of right sided weakness-->now flaccid with significant deficits.  Subjective: alert Assessment / Plan / Recommendation CHL IP CLINICAL IMPRESSIONS 07/24/2019 Clinical Impression Pt presents with moderate oropharyngeal dysphagia with intermittent aspiration of thin liquids before/during the swallow response - aspiration was not consistently accompanied by a cough response - it was occasionally silent in nature.  There was prolonged and decreased coordination of the oral phase of the swallow, with spillage from right side of oral cavity and premature spillage over base of tongue.  Aspiration was generally due to a problem with timing of laryngeal vestibule closure.  Nectar-thick liquids allowed for improved timing and no observed penetration/aspiration.  Pt had difficulty following commands for postural/strategic adjustments.  Anatomy was remarkable for degenerative changes in the cervical spine - this did not impact function. Calcified plaque in the arteries was also notable.  Recommend changing diet to dysphagia 3, nectar-thick  liquids. Treatment should focus on improving coordination/sensory reception within oral cavity as well as timing of pharyngeal response.   SLP Visit Diagnosis Dysphagia, oropharyngeal phase (R13.12) Attention and concentration deficit following -- Frontal lobe and executive function deficit following -- Impact on safety and function Moderate aspiration risk   [facility] IP TREATMENT RECOMMENDATION 07/24/2019 Treatment Recommendations Therapy as outlined in treatment plan below   Prognosis 07/20/2019 Prognosis for Safe Diet Advancement Good Barriers to Reach Goals -- Barriers/Prognosis Comment -- [facility] IP DIET RECOMMENDATION 07/24/2019 SLP Diet Recommendations Dysphagia 3 (Mech soft) solids;Nectar thick liquid Liquid Administration via Cup Medication Administration Crushed with puree Compensations Slow rate;Small sips/bites Postural Changes Remain semi-upright after after feeds/meals (Comment)   [facility] IP OTHER RECOMMENDATIONS 07/24/2019 Recommended Consults -- Oral Care Recommendations Oral care BID Other Recommendations Order thickener from pharmacy   [facility] IP FOLLOW UP RECOMMENDATIONS 07/24/2019 Follow up Recommendations Skilled Nursing facility   [facility] IP FREQUENCY AND DURATION 07/24/2019 Speech Therapy Frequency (ACUTE ONLY) min 2x/week Treatment Duration 1 week      CHL IP ORAL PHASE 07/24/2019 Oral Phase Impaired Oral - Pudding Teaspoon -- Oral - Pudding Cup -- Oral - Honey Teaspoon -- Oral - Honey Cup -- Oral - Nectar Teaspoon -- Oral - Nectar Cup Right anterior bolus loss;Weak lingual manipulation;Right pocketing in lateral sulci;Delayed oral transit;Decreased bolus cohesion Oral - Nectar Straw -- Oral - Thin Teaspoon Right anterior bolus loss;Weak lingual manipulation;Right pocketing in lateral sulci;Delayed oral transit;Decreased bolus cohesion Oral - Thin Cup Right anterior bolus loss;Weak lingual manipulation;Right pocketing in lateral sulci;Delayed oral transit;Decreased bolus cohesion Oral - Thin Straw Right anterior bolus  loss;Weak lingual manipulation;Right pocketing in lateral sulci;Delayed oral transit;Decreased bolus cohesion Oral - Puree Weak lingual manipulation;Right pocketing in lateral sulci;Delayed oral transit;Decreased bolus cohesion Oral - Mech Soft -- Oral - Regular Weak lingual manipulation;Right pocketing in lateral sulci;Delayed oral transit;Decreased bolus cohesion Oral - Multi-Consistency -- Oral - Pill -- Oral Phase - Comment --  [facility] IP PHARYNGEAL PHASE 07/24/2019 Pharyngeal Phase Impaired Pharyngeal- Pudding Teaspoon -- Pharyngeal --  Pharyngeal- Pudding Cup -- Pharyngeal -- Pharyngeal- Honey Teaspoon -- Pharyngeal -- Pharyngeal- Honey Cup -- Pharyngeal -- Pharyngeal- Nectar Teaspoon -- Pharyngeal -- Pharyngeal- Nectar Cup Delayed swallow initiation-vallecula;Delayed swallow initiation-pyriform sinuses;Reduced tongue base retraction Pharyngeal -- Pharyngeal- Nectar Straw -- Pharyngeal -- Pharyngeal- Thin Teaspoon Delayed swallow initiation-pyriform sinuses Pharyngeal -- Pharyngeal- Thin Cup Delayed swallow initiation-pyriform sinuses;Reduced airway/laryngeal closure;Penetration/Aspiration before swallow;Penetration/Aspiration during swallow;Trace aspiration;Pharyngeal residue - valleculae Pharyngeal Material enters airway, passes BELOW cords and not ejected out despite cough attempt by patient;Material enters airway, passes BELOW cords without attempt by patient to eject out (silent aspiration) Pharyngeal- Thin Straw Delayed swallow initiation-pyriform sinuses;Reduced airway/laryngeal closure;Penetration/Aspiration before swallow;Penetration/Aspiration during swallow;Trace aspiration;Pharyngeal residue - valleculae Pharyngeal Material enters airway, passes BELOW cords and not ejected out despite cough attempt by patient;Material enters airway, passes BELOW cords without attempt by patient to eject out (silent aspiration) Pharyngeal- Puree Delayed swallow initiation-vallecula;Pharyngeal residue - valleculae  Pharyngeal -- Pharyngeal- Mechanical Soft -- Pharyngeal -- Pharyngeal- Regular Delayed swallow initiation-vallecula;Pharyngeal residue - valleculae Pharyngeal -- Pharyngeal- Multi-consistency -- Pharyngeal -- Pharyngeal- Pill -- Pharyngeal -- Pharyngeal Comment --  CHL IP CERVICAL ESOPHAGEAL PHASE 02/04/2017 Cervical Esophageal Phase (No Data) Pudding Teaspoon -- Pudding Cup -- Honey Teaspoon -- Honey Cup -- Nectar Teaspoon -- Nectar Cup -- Nectar Straw -- Thin Teaspoon -- Thin Cup -- Thin Straw -- Puree -- Mechanical Soft -- Regular -- Multi-consistency -- Pill -- Cervical Esophageal Comment -- Juan Quam Laurice 07/24/2019, 1:28 PM              ECHOCARDIOGRAM COMPLETE  Result Date: 07/20/2019    ECHOCARDIOGRAM REPORT   Patient Name:   MARJEAN IMPERATO Date of Exam: 07/20/2019 Medical Rec #:  [id number]             Height:       64.0 in Accession #:    [id number]            Weight:       100.0 lb Date of Birth:  1927/12/02            BSA:          1.457 m Patient Age:    32 years              BP:           155/63 mmHg Patient Gender: F                     HR:           62 bpm. Exam Location:  Inpatient Procedure: 2D Echo, Cardiac Doppler and Color Doppler Indications:    TIA 435.9 / G45.9  History:        Patient has prior history of Echocardiogram examinations, most                 recent 11/16/2015. CAD, Carotid Disease; Risk                 Factors:Hypertension and Dyslipidemia. Hypothyroidism.  Sonographer:    Jonelle Sidle Dance Referring Phys: [id number] [place]  1. Left ventricular ejection fraction, by estimation, is 60 to 65%. The left ventricle has normal function. The left ventricle has no regional wall motion abnormalities. Left ventricular diastolic parameters are consistent with Grade I diastolic dysfunction (impaired relaxation). Elevated left atrial pressure.  2. Right ventricular systolic function is normal. The right ventricular size is normal. There is normal pulmonary artery  systolic pressure. The estimated right ventricular systolic  pressure is 29.2 mmHg.  3. The mitral valve is normal in structure. Mild mitral valve regurgitation. No evidence of mitral stenosis.  4. Tricuspid valve regurgitation is moderate.  5. The aortic valve is normal in structure. Aortic valve regurgitation is trivial. Mild aortic valve sclerosis is present, with no evidence of aortic valve stenosis.  6. The inferior vena cava is normal in size with greater than 50% respiratory variability, suggesting right atrial pressure of 3 mmHg. FINDINGS  Left Ventricle: Left ventricular ejection fraction, by estimation, is 60 to 65%. The left ventricle has normal function. The left ventricle has no regional wall motion abnormalities. The left ventricular internal cavity size was normal in size. There is  no left ventricular hypertrophy. Left ventricular diastolic parameters are consistent with Grade I diastolic dysfunction (impaired relaxation). Elevated left atrial pressure. Right Ventricle: The right ventricular size is normal. No increase in right ventricular wall thickness. Right ventricular systolic function is normal. There is normal pulmonary artery systolic pressure. The tricuspid regurgitant velocity is 2.30 m/s, and  with an assumed right atrial pressure of 8 mmHg, the estimated right ventricular systolic pressure is 30.0 mmHg. Left Atrium: Left atrial size was normal in size. Right Atrium: Right atrial size was normal in size. Pericardium: There is no evidence of pericardial effusion. Mitral Valve: The mitral valve is normal in structure. Normal mobility of the mitral valve leaflets. Mild mitral valve regurgitation. No evidence of mitral valve stenosis. Tricuspid Valve: The tricuspid valve is normal in structure. Tricuspid valve regurgitation is moderate . No evidence of tricuspid stenosis. Aortic Valve: The aortic valve is normal in structure. Aortic valve regurgitation is trivial. Aortic regurgitation PHT  measures 503 msec. Mild aortic valve sclerosis is present, with no evidence of aortic valve stenosis. Pulmonic Valve: The pulmonic valve was normal in structure. Pulmonic valve regurgitation is not visualized. No evidence of pulmonic stenosis. Aorta: The aortic root is normal in size and structure. Venous: The inferior vena cava is normal in size with greater than 50% respiratory variability, suggesting right atrial pressure of 3 mmHg. IAS/Shunts: No atrial level shunt detected by color flow Doppler.  LEFT VENTRICLE PLAX 2D LVIDd:         3.90 cm  Diastology LVIDs:         2.70 cm  LV e' lateral:   4.40 cm/s LV PW:         0.90 cm  LV E/e' lateral: 12.4 LV IVS:        0.80 cm  LV e' medial:    3.50 cm/s LVOT diam:     2.20 cm  LV E/e' medial:  15.5 LV SV:         67 LV SV Index:   46 LVOT Area:     3.80 cm  RIGHT VENTRICLE             IVC RV Basal diam:  3.40 cm     IVC diam: 2.20 cm RV Mid diam:    2.30 cm RV S prime:     10.90 cm/s TAPSE (M-mode): 2.5 cm LEFT ATRIUM             Index       RIGHT ATRIUM           Index LA diam:        3.10 cm 2.13 cm/m  RA Area:     15.40 cm LA Vol (A2C):   19.7 ml 13.52 ml/m RA Volume:   37.60 ml  25.80 ml/m LA Vol (A4C):   23.3 ml 15.99 ml/m LA Biplane Vol: 21.6 ml 14.82 ml/m  AORTIC VALVE LVOT Vmax:   66.60 cm/s LVOT Vmean:  43.000 cm/s LVOT VTI:    0.176 m AI PHT:      503 msec  AORTA Ao Root diam: 2.80 cm Ao Asc diam:  3.50 cm MITRAL VALVE               TRICUSPID VALVE MV Area (PHT): 2.22 cm    TR Peak grad:   21.2 mmHg MV Decel Time: 342 msec    TR Vmax:        230.00 cm/s MV E velocity: 54.40 cm/s MV A velocity: 75.80 cm/s  SHUNTS MV E/A ratio:  0.72        Systemic VTI:  0.18 m                            Systemic Diam: 2.20 cm Dani Gobble Croitoru MD Electronically signed by Sanda Klein MD Signature Date/Time: 07/20/2019/10:39:24 AM    Final    VAS US CAROTID (at [facility] and [facility] only)  Result Date: 07/20/2019 Carotid Arterial Duplex Study Indications:       TIA and  Carotid artery disease. Risk Factors:      Hypertension, hyperlipidemia. Comparison Study:  03/22/17 bilateral 40-59% Performing Technologist: June Leap RDMS, RVT  Examination Guidelines: A complete evaluation includes B-mode imaging, spectral Doppler, color Doppler, and power Doppler as needed of all accessible portions of each vessel. Bilateral testing is considered an integral part of a complete examination. Limited examinations for reoccurring indications may be performed as noted.  Right Carotid Findings: +----------+--------+--------+--------+--------------------------+--------+           PSV cm/sEDV cm/sStenosisPlaque Description        Comments +----------+--------+--------+--------+--------------------------+--------+ CCA Prox  57      6                                                  +----------+--------+--------+--------+--------------------------+--------+ CCA Distal73      11                                                 +----------+--------+--------+--------+--------------------------+--------+ ICA Prox  166     33      40-59%  heterogenous and irregular         +----------+--------+--------+--------+--------------------------+--------+ ICA Distal66      19                                                 +----------+--------+--------+--------+--------------------------+--------+ ECA       152     12                                                 +----------+--------+--------+--------+--------------------------+--------+ +----------+--------+-------+----------------+-------------------+           PSV cm/sEDV cmsDescribe  Arm Pressure (mmHG) +----------+--------+-------+----------------+-------------------+ [id number]             Multiphasic, WNL                    +----------+--------+-------+----------------+-------------------+ +---------+--------+--+--------+--+---------+ VertebralPSV cm/s41EDV cm/s11Antegrade  +---------+--------+--+--------+--+---------+  Left Carotid Findings: +----------+--------+--------+--------+--------------------+-------------------+           PSV cm/sEDV cm/sStenosisPlaque Description  Comments            +----------+--------+--------+--------+--------------------+-------------------+ CCA Prox  64      9                                                       +----------+--------+--------+--------+--------------------+-------------------+ CCA Distal54      15                                                      +----------+--------+--------+--------+--------------------+-------------------+ ICA Prox  115     23      1-39%   calcific and        acoustic shadowing                                    irregular           may obscure higher                                                        velocities          +----------+--------+--------+--------+--------------------+-------------------+ ICA Distal96      13                                                      +----------+--------+--------+--------+--------------------+-------------------+ ECA       61      3                                                       +----------+--------+--------+--------+--------------------+-------------------+ +----------+--------+--------+--------+-------------------+           PSV cm/sEDV cm/sDescribeArm Pressure (mmHG) +----------+--------+--------+--------+-------------------+ [id number]             Stenotic                    +----------+--------+--------+--------+-------------------+ +---------+--------+--+--------+--+---------+ VertebralPSV cm/s97EDV cm/s12Antegrade +---------+--------+--+--------+--+---------+   Summary: Right Carotid: Velocities in the right ICA are consistent with a 40-59%                stenosis. Left Carotid: Velocities in the left ICA are consistent with a 1-39% stenosis. Subclavians: Left subclavian artery was  stenotic. *See table(s) above for measurements and observations.  Electronically signed by Antony Contras MD  on 07/20/2019 at 10:57:45 AM.    Final        LAB RESULTS: Basic Metabolic Panel: Recent Labs  Lab 07/30/19 0955 08/01/19 0439  NA 145 152*  K 3.8 3.2*  CL 115* 123*  CO2 17* 17*  GLUCOSE 92 117*  BUN 67* 58*  CREATININE 1.39* 1.00  CALCIUM 8.7* 8.5*  MG  --  2.4   Liver Function Tests: Recent Labs  Lab 08/01/19 0439  AST 20  ALT 14  ALKPHOS 72  BILITOT 0.8  PROT 4.9*  ALBUMIN 2.3*   No results for input(s): LIPASE, AMYLASE in the last 168 hours. No results for input(s): AMMONIA in the last 168 hours. CBC: Recent Labs  Lab 07/30/19 0955 07/30/19 0955 08/01/19 0439  WBC 19.8*  --  18.0*  NEUTROABS  --   --  15.6*  HGB 11.1*  --  10.5*  HCT 35.8*  --  33.3*  MCV 94.2   < > 93.8  PLT 187  --  186   < > = values in this interval not displayed.   Cardiac Enzymes: No results for input(s): CKTOTAL, CKMB, CKMBINDEX, TROPONINI in the last 168 hours. BNP: Invalid input(s): POCBNP CBG: No results for input(s): GLUCAP in the last 168 hours.     Disposition and Follow-up: Discharge Instructions    Ambulatory referral to Neurology   Complete by: As directed    Follow up with stroke clinic NP (Jessica Vanschaick or Cecille Rubin, if both not available, consider Zachery Dauer, or [facility]) at [facility] in about 4 weeks. Thanks.   Diet - low sodium heart healthy   Complete by: As directed    Increase activity slowly   Complete by: As directed    No wound care   Complete by: As directed        DISPOSITION: Home with hospice   DISCHARGE FOLLOW-UP  Follow-up Information    [facility]. Schedule an appointment as soon as possible for a visit in 4 week(s).   Specialty: Neurology Why: as needed Contact information: [address] [place] [phone]       [name], Standley Brooking, MD Follow up.    Specialties: Internal Medicine, Pediatrics Why: as needed Contact information: [place] [address] [phone]                Time coordinating discharge:  63mins   Signed:   Estill Cotta M.D. [facility] 08/02/2019, 12:17 PM

## 2019-08-02 NOTE — Progress Notes (Signed)
Nutrition Brief Note  Chart reviewed. Pt now transitioning to comfort care.  No further nutrition interventions warranted at this time.  Please re-consult as needed.   Ervie Mccard, MS, RD, LDN RD pager number and weekend/on-call pager number located in Amion.    

## 2019-08-02 NOTE — Progress Notes (Signed)
Daily Progress Note   Patient Name: [name] [name]       Date: 08/02/2019 DOB: 1927/05/23  Age: 86 y.o. MRN#: [id number] Attending Physician: Mendel Corning, MD Primary Care Physician: Burnis Medin, MD Admit Date: 07/19/2019  HPI/Patient Profile:86 y.o.femalewith past medical history of CAD s/p stenting to RCA in 2005, peripheral vascular disease, COPD, CKD stage III, hypertension, hyperlipidemia, and carotid stenosis presentedon 6/17/2021to the ED with left-sided weakness and intermittent aphasia. Initial imaging was negative for stroke, subsequently she developed right-sided weakness and follow-up MRI showed punctuate acute infarcts in the left corona radiata and basal ganglia. Neurology recommended aspirin plavix for 3 months followed by Plavix monotherapy due to severe left MCA stenosis.  Patient with dysphagia and now with suspected aspiration pneumonia, started on Unasyn 6/29.  She also has severe malnutrition--BMI of 17.6 and albumin of 2.8 (6/23). Palliative care has been consulted to assist with goals of care.  Subjective: I was paged by case management at request of Dr. Tana Coast. Per team, concern was that patient was minimally responsive earlier this morning and may not be stable for transport. When I assess patient at the bedside, she is somewhat lethargic, but arouses easily to voice. She does appear more comfortable compared to yesterday.   I speak with granddaughter [name] [name] by phone and provide an update. As discussed yesterday, plan of care is for discharge home today with hospice care. [name] [name] has moved into the patient's home and will be providing care for her grandmother. She verbalizes that she is ready for this transition.  Length of Stay: 13  Current  Medications:  PRN Meds: acetaminophen **OR** acetaminophen (TYLENOL) oral liquid 160 mg/5 mL **OR** acetaminophen, antiseptic oral rinse, glycopyrrolate, levalbuterol, [COMPLETED] levalbuterol **FOLLOWED BY** levalbuterol, LORazepam **OR** LORazepam, morphine CONCENTRATE, ondansetron **OR** ondansetron (ZOFRAN) IV, polyvinyl alcohol, senna-docusate  Physical Exam Vitals reviewed.  HENT:     Head: Normocephalic and atraumatic.  Pulmonary:     Effort: Pulmonary effort is normal.  Neurological:     Mental Status: She is lethargic.     Cranial Nerves: Facial asymmetry present.     Motor: Weakness present.     Comments: Arouses to voice             Vital Signs: BP (!) 153/57 (BP Location: Right Arm)   Pulse 63  Temp 98.6 F (37 C) (Axillary)   Resp 19   Ht 5\' 4"  (1.626 m)   Wt 45.4 kg   SpO2 96%   BMI 17.16 kg/m  SpO2: SpO2: 96 % O2 Device: O2 Device: Room Air O2 Flow Rate: O2 Flow Rate (L/min): 2 L/min  Intake/output summary:   Intake/Output Summary (Last 24 hours) at 08/02/2019 1043 Last data filed at 08/02/2019 0754 Gross per 24 hour  Intake 0 ml  Output 1200 ml  Net -1200 ml   LBM: Last BM Date: 08/01/19 Baseline Weight: Weight: 45.4 kg Most recent weight: Weight: 45.4 kg       Palliative Assessment/Data: 10%     Palliative Care Assessment & Plan   Assessment: Patient transitioned to full comfort care 6/30. She is stable for transport to home with hospice care.   Recommendations/Plan: Continue comfort care DNR signed and placed on chart Please administer pain medication prior to transport  Code Status: DNR  Prognosis:  weeks  Discharge Planning: Home with Hospice  Care plan was discussed with Dr. Tana Coast, [place]  Thank you for allowing the Palliative Medicine Team to assist in the care of this patient.   Total Time 35 minutes Prolonged Time Billed  no       Greater than 50%  of this time was spent counseling and coordinating care related to the above  assessment and plan.  Lavena Bullion, NP  Please contact Palliative Medicine Team phone at [phone] for questions and concerns.

## 2019-08-02 NOTE — Plan of Care (Signed)
  Problem: Safety: Goal: Ability to remain free from injury will improve Outcome: Progressing   

## 2019-08-02 NOTE — Plan of Care (Signed)
  Problem: Safety: Goal: Ability to remain free from injury will improve 08/02/2019 1158 by Caroll Rancher, RN Outcome: Adequate for Discharge 08/02/2019 2595 by Caroll Rancher, RN Outcome: Progressing

## 2019-09-02 DEATH — deceased

## 2019-09-03 ENCOUNTER — Telehealth: Payer: Self-pay | Admitting: Cardiology

## 2019-09-03 NOTE — Telephone Encounter (Signed)
Attempted to contact patient to schedule follow up with [facility], number invalid
